# Patient Record
Sex: Female | Born: 1980
Health system: Southern US, Community
[De-identification: ages and names within clinical notes are randomized; demographics above are authoritative.]

## PROBLEM LIST (undated history)

## (undated) DIAGNOSIS — G8929 Other chronic pain: Secondary | ICD-10-CM

## (undated) DIAGNOSIS — F1410 Cocaine abuse, uncomplicated: Secondary | ICD-10-CM

## (undated) DIAGNOSIS — IMO0002 Reserved for concepts with insufficient information to code with codable children: Secondary | ICD-10-CM

## (undated) DIAGNOSIS — N809 Endometriosis, unspecified: Secondary | ICD-10-CM

## (undated) DIAGNOSIS — K219 Gastro-esophageal reflux disease without esophagitis: Secondary | ICD-10-CM

## (undated) DIAGNOSIS — F419 Anxiety disorder, unspecified: Secondary | ICD-10-CM

## (undated) DIAGNOSIS — R339 Retention of urine, unspecified: Secondary | ICD-10-CM

## (undated) DIAGNOSIS — I76 Septic arterial embolism: Secondary | ICD-10-CM

## (undated) DIAGNOSIS — M549 Dorsalgia, unspecified: Secondary | ICD-10-CM

## (undated) HISTORY — PX: TUBAL LIGATION: SHX77

## (undated) HISTORY — PX: APPENDECTOMY: SHX54

## (undated) HISTORY — DX: Cocaine abuse, uncomplicated: F14.10

## (undated) HISTORY — PX: TONSILLECTOMY: SUR1361

## (undated) HISTORY — PX: ABDOMINAL HYSTERECTOMY: SHX81

---

## 1999-01-19 ENCOUNTER — Encounter: Payer: Self-pay | Admitting: *Deleted

## 1999-01-19 ENCOUNTER — Inpatient Hospital Stay (HOSPITAL_COMMUNITY): Admission: AD | Admit: 1999-01-19 | Discharge: 1999-01-19 | Payer: Self-pay | Admitting: *Deleted

## 2000-03-24 ENCOUNTER — Emergency Department (HOSPITAL_COMMUNITY): Admission: EM | Admit: 2000-03-24 | Discharge: 2000-03-24 | Payer: Self-pay | Admitting: Emergency Medicine

## 2001-07-22 ENCOUNTER — Emergency Department (HOSPITAL_COMMUNITY): Admission: EM | Admit: 2001-07-22 | Discharge: 2001-07-22 | Payer: Self-pay | Admitting: Emergency Medicine

## 2001-07-22 ENCOUNTER — Encounter: Payer: Self-pay | Admitting: Emergency Medicine

## 2001-09-02 ENCOUNTER — Ambulatory Visit (HOSPITAL_COMMUNITY): Admission: RE | Admit: 2001-09-02 | Discharge: 2001-09-02 | Payer: Self-pay | Admitting: Cardiology

## 2001-09-02 ENCOUNTER — Encounter: Payer: Self-pay | Admitting: Cardiology

## 2001-09-15 ENCOUNTER — Encounter (INDEPENDENT_AMBULATORY_CARE_PROVIDER_SITE_OTHER): Payer: Self-pay | Admitting: Specialist

## 2001-09-15 ENCOUNTER — Ambulatory Visit (HOSPITAL_BASED_OUTPATIENT_CLINIC_OR_DEPARTMENT_OTHER): Admission: RE | Admit: 2001-09-15 | Discharge: 2001-09-15 | Payer: Self-pay | Admitting: Orthopedic Surgery

## 2001-10-21 ENCOUNTER — Emergency Department (HOSPITAL_COMMUNITY): Admission: EM | Admit: 2001-10-21 | Discharge: 2001-10-21 | Payer: Self-pay | Admitting: Emergency Medicine

## 2001-11-07 ENCOUNTER — Emergency Department (HOSPITAL_COMMUNITY): Admission: EM | Admit: 2001-11-07 | Discharge: 2001-11-08 | Payer: Self-pay

## 2001-11-16 ENCOUNTER — Ambulatory Visit (HOSPITAL_COMMUNITY): Admission: RE | Admit: 2001-11-16 | Discharge: 2001-11-16 | Payer: Self-pay | Admitting: *Deleted

## 2001-11-16 ENCOUNTER — Encounter: Payer: Self-pay | Admitting: *Deleted

## 2002-02-13 ENCOUNTER — Emergency Department (HOSPITAL_COMMUNITY): Admission: EM | Admit: 2002-02-13 | Discharge: 2002-02-13 | Payer: Self-pay | Admitting: *Deleted

## 2002-02-13 ENCOUNTER — Encounter: Payer: Self-pay | Admitting: *Deleted

## 2002-02-28 ENCOUNTER — Emergency Department (HOSPITAL_COMMUNITY): Admission: EM | Admit: 2002-02-28 | Discharge: 2002-03-01 | Payer: Self-pay | Admitting: Emergency Medicine

## 2002-03-04 ENCOUNTER — Emergency Department (HOSPITAL_COMMUNITY): Admission: EM | Admit: 2002-03-04 | Discharge: 2002-03-04 | Payer: Self-pay | Admitting: *Deleted

## 2002-05-16 ENCOUNTER — Emergency Department (HOSPITAL_COMMUNITY): Admission: EM | Admit: 2002-05-16 | Discharge: 2002-05-17 | Payer: Self-pay | Admitting: *Deleted

## 2002-06-05 ENCOUNTER — Encounter: Payer: Self-pay | Admitting: Emergency Medicine

## 2002-06-05 ENCOUNTER — Emergency Department (HOSPITAL_COMMUNITY): Admission: EM | Admit: 2002-06-05 | Discharge: 2002-06-05 | Payer: Self-pay | Admitting: Emergency Medicine

## 2002-07-25 ENCOUNTER — Emergency Department (HOSPITAL_COMMUNITY): Admission: EM | Admit: 2002-07-25 | Discharge: 2002-07-25 | Payer: Self-pay | Admitting: Emergency Medicine

## 2002-09-19 ENCOUNTER — Emergency Department (HOSPITAL_COMMUNITY): Admission: EM | Admit: 2002-09-19 | Discharge: 2002-09-19 | Payer: Self-pay | Admitting: Emergency Medicine

## 2002-10-09 ENCOUNTER — Emergency Department (HOSPITAL_COMMUNITY): Admission: EM | Admit: 2002-10-09 | Discharge: 2002-10-09 | Payer: Self-pay | Admitting: Emergency Medicine

## 2002-10-09 ENCOUNTER — Encounter: Payer: Self-pay | Admitting: Emergency Medicine

## 2002-10-16 ENCOUNTER — Emergency Department (HOSPITAL_COMMUNITY): Admission: EM | Admit: 2002-10-16 | Discharge: 2002-10-16 | Payer: Self-pay | Admitting: Emergency Medicine

## 2002-10-18 ENCOUNTER — Emergency Department (HOSPITAL_COMMUNITY): Admission: EM | Admit: 2002-10-18 | Discharge: 2002-10-18 | Payer: Self-pay | Admitting: Emergency Medicine

## 2002-10-18 ENCOUNTER — Encounter: Payer: Self-pay | Admitting: Emergency Medicine

## 2002-12-05 ENCOUNTER — Emergency Department (HOSPITAL_COMMUNITY): Admission: EM | Admit: 2002-12-05 | Discharge: 2002-12-05 | Payer: Self-pay | Admitting: Emergency Medicine

## 2003-03-09 ENCOUNTER — Inpatient Hospital Stay (HOSPITAL_COMMUNITY): Admission: EM | Admit: 2003-03-09 | Discharge: 2003-03-11 | Payer: Self-pay | Admitting: Emergency Medicine

## 2003-04-15 ENCOUNTER — Emergency Department (HOSPITAL_COMMUNITY): Admission: EM | Admit: 2003-04-15 | Discharge: 2003-04-15 | Payer: Self-pay | Admitting: Emergency Medicine

## 2003-09-28 ENCOUNTER — Emergency Department (HOSPITAL_COMMUNITY): Admission: EM | Admit: 2003-09-28 | Discharge: 2003-09-28 | Payer: Self-pay | Admitting: Emergency Medicine

## 2003-12-04 ENCOUNTER — Emergency Department (HOSPITAL_COMMUNITY): Admission: EM | Admit: 2003-12-04 | Discharge: 2003-12-05 | Payer: Self-pay | Admitting: Emergency Medicine

## 2004-05-01 ENCOUNTER — Emergency Department (HOSPITAL_COMMUNITY): Admission: EM | Admit: 2004-05-01 | Discharge: 2004-05-01 | Payer: Self-pay | Admitting: Emergency Medicine

## 2004-06-20 ENCOUNTER — Emergency Department (HOSPITAL_COMMUNITY): Admission: EM | Admit: 2004-06-20 | Discharge: 2004-06-20 | Payer: Self-pay | Admitting: Emergency Medicine

## 2004-08-15 ENCOUNTER — Emergency Department (HOSPITAL_COMMUNITY): Admission: EM | Admit: 2004-08-15 | Discharge: 2004-08-15 | Payer: Self-pay | Admitting: Emergency Medicine

## 2004-08-16 ENCOUNTER — Emergency Department (HOSPITAL_COMMUNITY): Admission: EM | Admit: 2004-08-16 | Discharge: 2004-08-16 | Payer: Self-pay | Admitting: Emergency Medicine

## 2004-08-19 ENCOUNTER — Ambulatory Visit: Payer: Self-pay | Admitting: Internal Medicine

## 2004-09-04 ENCOUNTER — Emergency Department (HOSPITAL_COMMUNITY): Admission: EM | Admit: 2004-09-04 | Discharge: 2004-09-05 | Payer: Self-pay | Admitting: Emergency Medicine

## 2004-10-04 ENCOUNTER — Emergency Department (HOSPITAL_COMMUNITY): Admission: EM | Admit: 2004-10-04 | Discharge: 2004-10-05 | Payer: Self-pay | Admitting: Emergency Medicine

## 2004-11-04 ENCOUNTER — Emergency Department (HOSPITAL_COMMUNITY): Admission: EM | Admit: 2004-11-04 | Discharge: 2004-11-04 | Payer: Self-pay | Admitting: Emergency Medicine

## 2004-11-07 ENCOUNTER — Inpatient Hospital Stay (HOSPITAL_COMMUNITY): Admission: AD | Admit: 2004-11-07 | Discharge: 2004-11-07 | Payer: Self-pay | Admitting: Obstetrics & Gynecology

## 2004-11-23 ENCOUNTER — Emergency Department (HOSPITAL_COMMUNITY): Admission: EM | Admit: 2004-11-23 | Discharge: 2004-11-23 | Payer: Self-pay | Admitting: Emergency Medicine

## 2004-11-24 ENCOUNTER — Emergency Department (HOSPITAL_COMMUNITY): Admission: EM | Admit: 2004-11-24 | Discharge: 2004-11-24 | Payer: Self-pay | Admitting: Emergency Medicine

## 2004-11-27 ENCOUNTER — Emergency Department (HOSPITAL_COMMUNITY): Admission: EM | Admit: 2004-11-27 | Discharge: 2004-11-27 | Payer: Self-pay | Admitting: Emergency Medicine

## 2004-12-05 ENCOUNTER — Emergency Department (HOSPITAL_COMMUNITY): Admission: EM | Admit: 2004-12-05 | Discharge: 2004-12-05 | Payer: Self-pay | Admitting: Emergency Medicine

## 2004-12-06 ENCOUNTER — Emergency Department (HOSPITAL_COMMUNITY): Admission: EM | Admit: 2004-12-06 | Discharge: 2004-12-06 | Payer: Self-pay | Admitting: Emergency Medicine

## 2004-12-08 ENCOUNTER — Inpatient Hospital Stay (HOSPITAL_COMMUNITY): Admission: AD | Admit: 2004-12-08 | Discharge: 2004-12-08 | Payer: Self-pay | Admitting: Obstetrics & Gynecology

## 2004-12-11 ENCOUNTER — Encounter: Payer: Self-pay | Admitting: Family Medicine

## 2004-12-11 ENCOUNTER — Emergency Department (HOSPITAL_COMMUNITY): Admission: EM | Admit: 2004-12-11 | Discharge: 2004-12-11 | Payer: Self-pay | Admitting: Emergency Medicine

## 2004-12-16 ENCOUNTER — Emergency Department (HOSPITAL_COMMUNITY): Admission: EM | Admit: 2004-12-16 | Discharge: 2004-12-16 | Payer: Self-pay | Admitting: Emergency Medicine

## 2004-12-19 ENCOUNTER — Emergency Department (HOSPITAL_COMMUNITY): Admission: EM | Admit: 2004-12-19 | Discharge: 2004-12-19 | Payer: Self-pay | Admitting: Emergency Medicine

## 2004-12-20 ENCOUNTER — Emergency Department (HOSPITAL_COMMUNITY): Admission: EM | Admit: 2004-12-20 | Discharge: 2004-12-20 | Payer: Self-pay | Admitting: Emergency Medicine

## 2004-12-23 ENCOUNTER — Emergency Department (HOSPITAL_COMMUNITY): Admission: EM | Admit: 2004-12-23 | Discharge: 2004-12-23 | Payer: Self-pay | Admitting: Emergency Medicine

## 2004-12-24 ENCOUNTER — Emergency Department (HOSPITAL_COMMUNITY): Admission: EM | Admit: 2004-12-24 | Discharge: 2004-12-25 | Payer: Self-pay | Admitting: Emergency Medicine

## 2004-12-25 ENCOUNTER — Emergency Department (HOSPITAL_COMMUNITY): Admission: EM | Admit: 2004-12-25 | Discharge: 2004-12-25 | Payer: Self-pay | Admitting: Emergency Medicine

## 2004-12-27 ENCOUNTER — Emergency Department (HOSPITAL_COMMUNITY): Admission: EM | Admit: 2004-12-27 | Discharge: 2004-12-27 | Payer: Self-pay | Admitting: Emergency Medicine

## 2005-01-05 ENCOUNTER — Inpatient Hospital Stay (HOSPITAL_COMMUNITY): Admission: AD | Admit: 2005-01-05 | Discharge: 2005-01-05 | Payer: Self-pay | Admitting: Obstetrics & Gynecology

## 2005-01-06 ENCOUNTER — Ambulatory Visit: Payer: Self-pay | Admitting: *Deleted

## 2005-01-06 ENCOUNTER — Inpatient Hospital Stay (HOSPITAL_COMMUNITY): Admission: AD | Admit: 2005-01-06 | Discharge: 2005-01-09 | Payer: Self-pay | Admitting: Obstetrics and Gynecology

## 2005-01-23 ENCOUNTER — Ambulatory Visit: Payer: Self-pay | Admitting: Family Medicine

## 2005-02-06 ENCOUNTER — Ambulatory Visit (HOSPITAL_COMMUNITY): Admission: RE | Admit: 2005-02-06 | Discharge: 2005-02-06 | Payer: Self-pay | Admitting: *Deleted

## 2005-02-06 ENCOUNTER — Ambulatory Visit: Payer: Self-pay | Admitting: Family Medicine

## 2005-02-20 ENCOUNTER — Ambulatory Visit: Payer: Self-pay | Admitting: Family Medicine

## 2005-03-06 ENCOUNTER — Ambulatory Visit: Payer: Self-pay | Admitting: Family Medicine

## 2005-03-06 ENCOUNTER — Encounter: Payer: Self-pay | Admitting: Family Medicine

## 2005-03-12 ENCOUNTER — Ambulatory Visit: Payer: Self-pay | Admitting: *Deleted

## 2005-03-20 ENCOUNTER — Ambulatory Visit: Payer: Self-pay | Admitting: Family Medicine

## 2005-03-31 ENCOUNTER — Ambulatory Visit: Payer: Self-pay | Admitting: *Deleted

## 2005-04-07 ENCOUNTER — Ambulatory Visit: Payer: Self-pay | Admitting: Obstetrics & Gynecology

## 2005-04-21 ENCOUNTER — Ambulatory Visit: Payer: Self-pay | Admitting: Family Medicine

## 2005-04-29 ENCOUNTER — Ambulatory Visit (HOSPITAL_COMMUNITY): Admission: RE | Admit: 2005-04-29 | Discharge: 2005-04-29 | Payer: Self-pay | Admitting: *Deleted

## 2005-05-05 ENCOUNTER — Ambulatory Visit: Payer: Self-pay | Admitting: Obstetrics & Gynecology

## 2005-05-08 ENCOUNTER — Inpatient Hospital Stay (HOSPITAL_COMMUNITY): Admission: AD | Admit: 2005-05-08 | Discharge: 2005-05-08 | Payer: Self-pay | Admitting: Obstetrics & Gynecology

## 2005-05-09 ENCOUNTER — Inpatient Hospital Stay (HOSPITAL_COMMUNITY): Admission: AD | Admit: 2005-05-09 | Discharge: 2005-05-09 | Payer: Self-pay | Admitting: *Deleted

## 2005-05-19 ENCOUNTER — Ambulatory Visit: Payer: Self-pay | Admitting: Obstetrics & Gynecology

## 2005-05-23 ENCOUNTER — Ambulatory Visit (HOSPITAL_COMMUNITY): Admission: RE | Admit: 2005-05-23 | Discharge: 2005-05-23 | Payer: Self-pay | Admitting: *Deleted

## 2005-05-26 ENCOUNTER — Ambulatory Visit: Payer: Self-pay | Admitting: Family Medicine

## 2005-05-31 ENCOUNTER — Ambulatory Visit: Payer: Self-pay | Admitting: Certified Nurse Midwife

## 2005-05-31 ENCOUNTER — Inpatient Hospital Stay (HOSPITAL_COMMUNITY): Admission: AD | Admit: 2005-05-31 | Discharge: 2005-05-31 | Payer: Self-pay | Admitting: Family Medicine

## 2005-06-02 ENCOUNTER — Ambulatory Visit: Payer: Self-pay | Admitting: Obstetrics & Gynecology

## 2005-06-03 ENCOUNTER — Inpatient Hospital Stay (HOSPITAL_COMMUNITY): Admission: AD | Admit: 2005-06-03 | Discharge: 2005-06-04 | Payer: Self-pay | Admitting: Gynecology

## 2005-06-04 ENCOUNTER — Inpatient Hospital Stay (HOSPITAL_COMMUNITY): Admission: AD | Admit: 2005-06-04 | Discharge: 2005-06-05 | Payer: Self-pay | Admitting: Obstetrics and Gynecology

## 2005-06-04 ENCOUNTER — Ambulatory Visit: Payer: Self-pay | Admitting: Obstetrics and Gynecology

## 2005-06-09 ENCOUNTER — Inpatient Hospital Stay (HOSPITAL_COMMUNITY): Admission: AD | Admit: 2005-06-09 | Discharge: 2005-06-09 | Payer: Self-pay | Admitting: Gynecology

## 2005-06-09 ENCOUNTER — Inpatient Hospital Stay (HOSPITAL_COMMUNITY): Admission: AD | Admit: 2005-06-09 | Discharge: 2005-06-09 | Payer: Self-pay | Admitting: Obstetrics & Gynecology

## 2005-06-09 ENCOUNTER — Ambulatory Visit: Payer: Self-pay | Admitting: Family Medicine

## 2005-06-09 ENCOUNTER — Ambulatory Visit: Payer: Self-pay | Admitting: Certified Nurse Midwife

## 2005-06-10 ENCOUNTER — Inpatient Hospital Stay (HOSPITAL_COMMUNITY): Admission: AD | Admit: 2005-06-10 | Discharge: 2005-06-12 | Payer: Self-pay | Admitting: Gynecology

## 2005-06-10 ENCOUNTER — Ambulatory Visit: Payer: Self-pay | Admitting: Family Medicine

## 2005-09-10 ENCOUNTER — Emergency Department (HOSPITAL_COMMUNITY): Admission: EM | Admit: 2005-09-10 | Discharge: 2005-09-10 | Payer: Self-pay | Admitting: Emergency Medicine

## 2005-09-13 ENCOUNTER — Emergency Department (HOSPITAL_COMMUNITY): Admission: EM | Admit: 2005-09-13 | Discharge: 2005-09-13 | Payer: Self-pay | Admitting: Emergency Medicine

## 2006-10-19 ENCOUNTER — Emergency Department (HOSPITAL_COMMUNITY): Admission: EM | Admit: 2006-10-19 | Discharge: 2006-10-20 | Payer: Self-pay | Admitting: Emergency Medicine

## 2007-03-10 ENCOUNTER — Ambulatory Visit (HOSPITAL_COMMUNITY): Admission: RE | Admit: 2007-03-10 | Discharge: 2007-03-10 | Payer: Self-pay | Admitting: Psychiatry

## 2007-03-16 ENCOUNTER — Inpatient Hospital Stay (HOSPITAL_COMMUNITY): Admission: AD | Admit: 2007-03-16 | Discharge: 2007-03-16 | Payer: Self-pay | Admitting: Family Medicine

## 2007-04-14 ENCOUNTER — Ambulatory Visit: Payer: Self-pay | Admitting: Obstetrics & Gynecology

## 2007-04-14 ENCOUNTER — Encounter: Payer: Self-pay | Admitting: Obstetrics and Gynecology

## 2007-04-22 ENCOUNTER — Ambulatory Visit: Payer: Self-pay | Admitting: Family Medicine

## 2007-04-27 ENCOUNTER — Ambulatory Visit (HOSPITAL_COMMUNITY): Admission: RE | Admit: 2007-04-27 | Discharge: 2007-04-27 | Payer: Self-pay | Admitting: Family Medicine

## 2007-05-13 ENCOUNTER — Ambulatory Visit: Payer: Self-pay | Admitting: Obstetrics & Gynecology

## 2007-05-16 ENCOUNTER — Inpatient Hospital Stay (HOSPITAL_COMMUNITY): Admission: AD | Admit: 2007-05-16 | Discharge: 2007-05-16 | Payer: Self-pay | Admitting: Obstetrics and Gynecology

## 2007-06-08 ENCOUNTER — Ambulatory Visit (HOSPITAL_COMMUNITY): Admission: RE | Admit: 2007-06-08 | Discharge: 2007-06-08 | Payer: Self-pay | Admitting: Family Medicine

## 2007-06-24 ENCOUNTER — Ambulatory Visit: Payer: Self-pay | Admitting: *Deleted

## 2007-07-08 ENCOUNTER — Ambulatory Visit: Payer: Self-pay | Admitting: Obstetrics & Gynecology

## 2007-07-08 ENCOUNTER — Ambulatory Visit (HOSPITAL_COMMUNITY): Admission: RE | Admit: 2007-07-08 | Discharge: 2007-07-08 | Payer: Self-pay | Admitting: Family Medicine

## 2007-07-22 ENCOUNTER — Ambulatory Visit: Payer: Self-pay | Admitting: Obstetrics & Gynecology

## 2007-08-05 ENCOUNTER — Ambulatory Visit: Payer: Self-pay | Admitting: *Deleted

## 2007-08-05 ENCOUNTER — Ambulatory Visit (HOSPITAL_COMMUNITY): Admission: RE | Admit: 2007-08-05 | Discharge: 2007-08-05 | Payer: Self-pay | Admitting: Family Medicine

## 2007-08-19 ENCOUNTER — Ambulatory Visit: Payer: Self-pay | Admitting: Obstetrics & Gynecology

## 2007-08-23 ENCOUNTER — Ambulatory Visit: Payer: Self-pay | Admitting: Physician Assistant

## 2007-08-23 ENCOUNTER — Inpatient Hospital Stay (HOSPITAL_COMMUNITY): Admission: AD | Admit: 2007-08-23 | Discharge: 2007-08-24 | Payer: Self-pay | Admitting: Obstetrics & Gynecology

## 2007-09-02 ENCOUNTER — Ambulatory Visit: Payer: Self-pay | Admitting: Family Medicine

## 2007-09-02 ENCOUNTER — Ambulatory Visit (HOSPITAL_COMMUNITY): Admission: RE | Admit: 2007-09-02 | Discharge: 2007-09-02 | Payer: Self-pay | Admitting: Family Medicine

## 2007-09-09 ENCOUNTER — Ambulatory Visit: Payer: Self-pay | Admitting: Obstetrics & Gynecology

## 2007-09-17 ENCOUNTER — Ambulatory Visit: Payer: Self-pay | Admitting: Obstetrics & Gynecology

## 2007-09-17 ENCOUNTER — Inpatient Hospital Stay (HOSPITAL_COMMUNITY): Admission: AD | Admit: 2007-09-17 | Discharge: 2007-09-17 | Payer: Self-pay | Admitting: Obstetrics & Gynecology

## 2007-09-18 ENCOUNTER — Ambulatory Visit: Payer: Self-pay | Admitting: Gynecology

## 2007-09-18 ENCOUNTER — Inpatient Hospital Stay (HOSPITAL_COMMUNITY): Admission: AD | Admit: 2007-09-18 | Discharge: 2007-09-18 | Payer: Self-pay | Admitting: Gynecology

## 2007-09-23 ENCOUNTER — Other Ambulatory Visit: Payer: Self-pay | Admitting: Obstetrics & Gynecology

## 2007-09-30 ENCOUNTER — Ambulatory Visit (HOSPITAL_COMMUNITY): Admission: RE | Admit: 2007-09-30 | Discharge: 2007-09-30 | Payer: Self-pay | Admitting: Family Medicine

## 2007-09-30 ENCOUNTER — Ambulatory Visit: Payer: Self-pay | Admitting: Obstetrics & Gynecology

## 2007-10-01 ENCOUNTER — Inpatient Hospital Stay (HOSPITAL_COMMUNITY): Admission: AD | Admit: 2007-10-01 | Discharge: 2007-10-01 | Payer: Self-pay | Admitting: Obstetrics & Gynecology

## 2007-10-01 ENCOUNTER — Ambulatory Visit: Payer: Self-pay | Admitting: Obstetrics and Gynecology

## 2007-10-10 ENCOUNTER — Ambulatory Visit: Payer: Self-pay | Admitting: Obstetrics and Gynecology

## 2007-10-10 ENCOUNTER — Inpatient Hospital Stay (HOSPITAL_COMMUNITY): Admission: AD | Admit: 2007-10-10 | Discharge: 2007-10-11 | Payer: Self-pay | Admitting: Obstetrics & Gynecology

## 2007-10-11 ENCOUNTER — Ambulatory Visit: Payer: Self-pay | Admitting: Obstetrics & Gynecology

## 2007-10-12 ENCOUNTER — Inpatient Hospital Stay (HOSPITAL_COMMUNITY): Admission: RE | Admit: 2007-10-12 | Discharge: 2007-10-14 | Payer: Self-pay | Admitting: Obstetrics & Gynecology

## 2007-10-12 ENCOUNTER — Ambulatory Visit: Payer: Self-pay | Admitting: Advanced Practice Midwife

## 2008-04-01 ENCOUNTER — Emergency Department (HOSPITAL_COMMUNITY): Admission: EM | Admit: 2008-04-01 | Discharge: 2008-04-01 | Payer: Self-pay | Admitting: Emergency Medicine

## 2008-06-10 ENCOUNTER — Emergency Department (HOSPITAL_COMMUNITY): Admission: EM | Admit: 2008-06-10 | Discharge: 2008-06-10 | Payer: Self-pay | Admitting: Emergency Medicine

## 2009-04-12 ENCOUNTER — Other Ambulatory Visit: Payer: Self-pay

## 2009-04-12 ENCOUNTER — Ambulatory Visit: Payer: Self-pay | Admitting: Psychiatry

## 2009-04-12 ENCOUNTER — Other Ambulatory Visit: Payer: Self-pay | Admitting: Emergency Medicine

## 2009-04-12 ENCOUNTER — Inpatient Hospital Stay (HOSPITAL_COMMUNITY): Admission: AD | Admit: 2009-04-12 | Discharge: 2009-04-17 | Payer: Self-pay | Admitting: Psychiatry

## 2009-06-06 ENCOUNTER — Emergency Department (HOSPITAL_COMMUNITY): Admission: EM | Admit: 2009-06-06 | Discharge: 2009-06-07 | Payer: Self-pay | Admitting: Emergency Medicine

## 2009-06-07 ENCOUNTER — Inpatient Hospital Stay (HOSPITAL_COMMUNITY): Admission: EM | Admit: 2009-06-07 | Discharge: 2009-06-11 | Payer: Self-pay | Admitting: Psychiatry

## 2009-06-07 ENCOUNTER — Ambulatory Visit: Payer: Self-pay | Admitting: Psychiatry

## 2009-07-28 ENCOUNTER — Emergency Department (HOSPITAL_COMMUNITY): Admission: EM | Admit: 2009-07-28 | Discharge: 2009-07-28 | Payer: Self-pay | Admitting: Emergency Medicine

## 2009-09-27 ENCOUNTER — Emergency Department (HOSPITAL_COMMUNITY)
Admission: EM | Admit: 2009-09-27 | Discharge: 2009-09-27 | Payer: Self-pay | Source: Home / Self Care | Admitting: Emergency Medicine

## 2009-09-29 ENCOUNTER — Emergency Department (HOSPITAL_COMMUNITY): Admission: EM | Admit: 2009-09-29 | Discharge: 2009-09-29 | Payer: Self-pay | Admitting: Emergency Medicine

## 2010-02-10 ENCOUNTER — Encounter: Payer: Self-pay | Admitting: *Deleted

## 2010-04-03 ENCOUNTER — Emergency Department (HOSPITAL_COMMUNITY)
Admission: EM | Admit: 2010-04-03 | Discharge: 2010-04-03 | Disposition: A | Discharge status: Home / Self Care | Payer: Self-pay | Attending: Emergency Medicine | Admitting: Emergency Medicine

## 2010-04-03 DIAGNOSIS — J069 Acute upper respiratory infection, unspecified: Secondary | ICD-10-CM | POA: Insufficient documentation

## 2010-04-03 DIAGNOSIS — R05 Cough: Secondary | ICD-10-CM | POA: Insufficient documentation

## 2010-04-03 DIAGNOSIS — R059 Cough, unspecified: Secondary | ICD-10-CM | POA: Insufficient documentation

## 2010-04-03 DIAGNOSIS — J040 Acute laryngitis: Secondary | ICD-10-CM | POA: Insufficient documentation

## 2010-04-08 LAB — CBC
HCT: 39.9 % (ref 36.0–46.0)
Platelets: 294 10*3/uL (ref 150–400)
RDW: 13.7 % (ref 11.5–15.5)

## 2010-04-08 LAB — RAPID URINE DRUG SCREEN, HOSP PERFORMED
Barbiturates: NOT DETECTED
Opiates: POSITIVE — AB

## 2010-04-08 LAB — BASIC METABOLIC PANEL
BUN: 3 mg/dL — ABNORMAL LOW (ref 6–23)
Creatinine, Ser: 0.69 mg/dL (ref 0.4–1.2)
GFR calc non Af Amer: >60 mL/min (ref >60)
Glucose, Bld: 118 mg/dL — ABNORMAL HIGH (ref 70–99)
Potassium: 3.6 mEq/L (ref 3.5–5.1)

## 2010-04-08 LAB — ETHANOL: Alcohol, Ethyl (B): <5 mg/dL (ref 0–10)

## 2010-04-08 LAB — DIFFERENTIAL
Basophils Absolute: 0.1 10*3/uL (ref 0.0–0.1)
Eosinophils Absolute: 0.2 10*3/uL (ref 0.0–0.7)
Eosinophils Relative: 2 % (ref 0–5)
Lymphocytes Relative: 25 % (ref 12–46)
Neutrophils Relative %: 69 % (ref 43–77)

## 2010-04-14 LAB — RAPID URINE DRUG SCREEN, HOSP PERFORMED
Amphetamines: NOT DETECTED
Cocaine: POSITIVE — AB
Opiates: POSITIVE — AB
Tetrahydrocannabinol: POSITIVE — AB

## 2010-04-14 LAB — POCT I-STAT, CHEM 8
Glucose, Bld: 118 mg/dL — ABNORMAL HIGH (ref 70–99)
HCT: 43 % (ref 36.0–46.0)
Hemoglobin: 14.6 g/dL (ref 12.0–15.0)
Potassium: 3.8 mEq/L (ref 3.5–5.1)
Sodium: 140 mEq/L (ref 135–145)

## 2010-04-14 LAB — CBC
MCHC: 34.5 g/dL (ref 30.0–36.0)
MCV: 93.5 fL (ref 78.0–100.0)
Platelets: 233 10*3/uL (ref 150–400)
RDW: 13.1 % (ref 11.5–15.5)

## 2010-04-14 LAB — DIFFERENTIAL
Basophils Absolute: 0 10*3/uL (ref 0.0–0.1)
Basophils Relative: 0 % (ref 0–1)
Eosinophils Absolute: 0.1 10*3/uL (ref 0.0–0.7)
Neutrophils Relative %: 82 % — ABNORMAL HIGH (ref 43–77)

## 2010-04-14 LAB — URINALYSIS, ROUTINE W REFLEX MICROSCOPIC
Ketones, ur: NEGATIVE mg/dL
Nitrite: NEGATIVE
Protein, ur: NEGATIVE mg/dL
pH: 7 (ref 5.0–8.0)

## 2010-04-15 LAB — HEPATIC FUNCTION PANEL
ALT: 21 U/L (ref 0–35)
AST: 13 U/L (ref 0–37)
Albumin: 3.5 g/dL (ref 3.5–5.2)
Alkaline Phosphatase: 60 U/L (ref 39–117)
Bilirubin, Direct: <0.1 mg/dL (ref 0.0–0.3)
Total Bilirubin: 0.2 mg/dL — ABNORMAL LOW (ref 0.3–1.2)
Total Protein: 7.2 g/dL (ref 6.0–8.3)

## 2010-04-15 LAB — TSH: TSH: 0.736 u[IU]/mL (ref 0.350–4.500)

## 2010-04-15 LAB — T4, FREE: Free T4: 1 ng/dL (ref 0.80–1.80)

## 2010-04-15 LAB — SYPHILIS: RPR W/REFLEX TO RPR TITER AND TREPONEMAL ANTIBODIES, TRADITIONAL SCREENING AND DIAGNOSIS ALGORITHM: RPR Ser Ql: NONREACTIVE

## 2010-06-04 NOTE — Op Note (Signed)
NAME:  [name] [name], [name] [name]             ACCOUNT NO.:  [id number]   MEDICAL RECORD NO.:  [id number]         PATIENT TYPE:  WINP   LOCATION:                                FACILITY:  [facility]   PHYSICIAN:  Tanya S. Shawnie Pons, M.D.   DATE OF BIRTH:  1980-04-14   DATE OF PROCEDURE:  10/13/2007  DATE OF DISCHARGE:                               OPERATIVE REPORT   PREOPERATIVE DIAGNOSES:  Multiparity, undesired fertility.   POSTOPERATIVE DIAGNOSES:  Multiparity, undesired fertility.   PROCEDURE:  Postpartum bilateral tubal ligation.   SURGEON:  Shelbie Proctor. Shawnie Pons, MD.   ASSISTANT:  None.   ANESTHESIA:  General with Dr. Pamalee Leyden.   FINDINGS:  Normal-appearing tubes.   SPECIMENS:  None.   ESTIMATED BLOOD LOSS:  Minimal.   COMPLICATIONS:  None known.   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  The patient desires permanent sterility.  The  patient was counseled regarding risks of this procedure including  permanency of the procedure, risk of failure 1 in 100, and increased  risk of ectopic bleeding, and infection risk.  The patient agrees with  these and desires to proceed.   DESCRIPTION OF PROCEDURE:  The patient was taken to the OR.  She was  placed in supine position.  She was prepped and draped in usual sterile  fashion.  A 1.5-cm incision was made infraumbilically and carried down  to underlying fascia after injection with 10 mL of 0.5% Marcaine with  epinephrine.  The peritoneal cavity was entered sharply and the patient  was placed in Trendelenburg.  The patient's right tube was identified  and grasped with a Babcock clamp followed to its fimbriated end.  A  Filshie clip was placed across this tube and this tube was also returned  to the cavity.  The patient's left tube was identified and grasped  with  a Babcock clamp, brought out of the incision followed to its fimbriated  end.  A Filshie clip was placed across this tube as well.  The tube was  allowed to return to the abdominal cavity.  All instruments were removed  from the abdomen.  The fascia was closed with a 0-  Vicryl suture in a running fashion.  Subcuticular tissue closure with 4-  0 Vicryl was used to close the incision.  All instrument and lap counts  correct were x2.  The patient was awakened and taken to recovery room in  stable condition.      Shelbie Proctor. Shawnie Pons, M.D.  Electronically Signed     TSP/MEDQ  D:  10/13/2007  T:  10/14/2007  Job:  [id number]

## 2010-06-07 NOTE — H&P (Signed)
NAME:  [name], [name]                       ACCOUNT NO.:  [id number]   MEDICAL RECORD NO.:  [id number]                   PATIENT TYPE:  INP   LOCATION:  0445                                 FACILITY:  [facility]   PHYSICIAN:  Sherin Quarry, MD                   DATE OF BIRTH:  [birth year]   DATE OF ADMISSION:  03/09/2003  DATE OF DISCHARGE:                                HISTORY & PHYSICAL   HISTORY OF PRESENT ILLNESS:  [name] [name] is a 30 year old lady who  indicates that she is an occasional user of heroine.  She states that she  injected herself with heroine in the right antecubital area on Sunday night.  On Monday she had noted redness and discomfort in this area and presented to  Dr. Jeannetta Nap office.  He diagnosed a cellulitis and prescribed dicloxacillin  500 mg p.o. q.6h.  She states that she has been taking the antibiotic  regularly, and has taken at least six doses.  However, unfortunately there  has been increasing pain in the antecubital area as well as spreading  redness and intermittent fever.  For this reason, she presents to the [facility] Emergency Room.  After presenting to the emergency room, the area just  above the antecubital region began to drain purulent material.  She is  admitted at this time for treatment of an infectious thrombophlebitis,  possibly secondary to a Staphylococcal infection.   MEDICATIONS:  1. The patient indicates that she takes Zomig 5 mg p.r.n. for headache.  2. Amitriptyline 50 mg at bedtime.  3. Klonopin 0.5 mg b.i.d.  4. Nexium 40 mg daily.   ALLERGIES:  IBUPROFEN and possibly to PENICILLIN, although it is noteworthy  that she has been taking dicloxacillin.   PAST SURGICAL HISTORY:  She denies any previous operations.   It is noteworthy from review of the medical record that she has had many  previous emergency room visits and has had extensive CT scanning of the  head, chest, and abdomen in the past.  She is somewhat unclear about  why all  of these tests were done.   FAMILY HISTORY:  Her father has cirrhosis of the liver secondary to alcohol  abuse.  Her mother and brothers are in good health.   SOCIAL HISTORY:  She smokes one to two packs of cigarettes per day.  She  will occasionally drink alcohol.  As previously mentioned, she states that  she uses intravenous heroine on an occasional basis.   REVIEW OF SYSTEMS:  HEAD:  She denies headache or dizziness.  EYES:  She  denies visual blurring or diplopia.  EARS, NOSE, AND THROAT:  Denies  earache, sinus pain, or sore throat.  CHEST:  Denies coughing, wheezing, or  chest congestion.  CARDIOVASCULAR:  Denies orthopnea, PND, or ankle edema.  GASTROINTESTINAL:  Denies nausea, vomiting, abdominal pain.  GENITOURINARY:  Denies dysuria or urinary frequency.  NEUROLOGIC:  Denies history of seizure  or stroke.  ENDOCRINE:  Denies excessive thirst, urinary frequency, or  nocturia.   PHYSICAL EXAMINATION:  HEENT:  Within normal limits.  CHEST:  Clear to auscultation and percussion.  BACK:  No CVA or point tenderness.  CARDIOVASCULAR:  Normal S1 and S2 without murmurs, rubs, or gallops.  ABDOMEN:  Benign, normal bowel sounds without masses, tenderness, or  organomegaly.  NEUROLOGIC:  Within normal limits.  EXTREMITIES:  Remarkable for the right antecubital area which is swollen and  very tender.  There is spontaneous drainage of a substantial amount of  purulent material, redness and tenderness extends about  6 cm above the antecubital fossa.   IMPRESSION:  1. Pyelophlebitis secondary to intravenous heroine usage.  2. Chronic migraine headaches.  3. Chronic anxiety.  4. Gastroesophageal reflux.   RECOMMENDATIONS:  I advised the patient that we had been seeing a fairly  frequent presentation of people with outpatient acquired methicillin-  resistant Staphylococcus infections, and that it would probably be best to  initiate therapy with vancomycin and Rocephin to  cover this possibility.  A  culture was obtained of the drainage and sent for Gram stain and routine  culture.  We will continue her usual medications.  Given her history of  using intravenous drugs, it would probably be a good idea at some point to  obtain human immunodeficiency virus testing.  CBC and CMET will also be  obtained.                                               Sherin Quarry, MD    SY/MEDQ  D:  03/09/2003  T:  03/09/2003  Job:  [id number]   cc:   Windle Guard, M.D.  [address]  [place], [place] [address]  Fax: [phone]

## 2010-06-07 NOTE — Discharge Summary (Signed)
NAME:  [name] [name]             ACCOUNT NO.:  [id number]   MEDICAL RECORD NO.:  [id number]          PATIENT TYPE:  INP   LOCATION:  9317                          FACILITY:  [facility]   PHYSICIAN:  Angie B. Merlene Morse, MD  DATE OF BIRTH:  Nov 08, 1980   DATE OF ADMISSION:  01/06/2005  DATE OF DISCHARGE:  01/09/2005                                 DISCHARGE SUMMARY   ADMITTING DIAGNOSES:  1.  Migraine.  2.  Hyperemesis gravidarum.  3.  Fifteen-week intrauterine pregnancy.   DISCHARGE DIAGNOSES:  1.  Fifteen-week intrauterine pregnancy.  2.  Migraine, resolved.  3.  Nausea and vomiting, resolved.  4.  Chemical dependence.   ADMITTING ATTENDING:  Dr. Shawnie Pons.   DISCHARGE ATTENDING:  Dr. Gavin Potters.   CONSULTS:  Neurology and psychiatry, Dr. Jeanie Sewer.   ADMITTING HISTORY AND PHYSICAL:  The patient is a 30 year old G7, P1-0-5-1  at 94 and [redacted] weeks gestation complaining vomiting, and diarrhea for 18 hours.  She also complains a headache.  This is her third ER visit in a 24-hour  period, she has had Demerol, Phenergan, three bags of IV fluid, Reglan,  Dilaudid.  She last took Phenergan five hours before her arrival in the MAU,  was having vomiting despite that.   MEDICATIONS:  Prenatal vitamins, Enderal, Phenergan, magnesium, Percocet.   ALLERGIES:  IBUPROFEN, PENICILLIN, CODEINE.   OBSTETRIC HISTORY:  SVD x1.  EAB x4.  Incomplete AB x1.   GYNECOLOGIC HISTORY:  Normal Pap.  Endometriosis.   MEDICAL:  Migraine for eight to nine years, history of hyperemesis  gravidarum with the first pregnancy.  Her TABS were not related to  hyperemesis.   SURGERIES:  D&E x5, appendectomy, tonsillectomy, diagnostic laparoscopy x2.   SOCIAL HISTORY:  Half a pack per day, were ethanol, denies drug use.  One  partner for three years.  She has had 16 ER visits this past three months.   PHYSICAL EXAMINATION:  VITAL SIGNS:  Her temp was 99.7, her pulse was 98,  respiratory rate was 20, blood pressure  110/60.  GENERAL:  She is a well-developed, well-nourished female, appeared  comfortable.  She was curled into a fetal position.  CHEST:  Clear to auscultation bilaterally.  ABDOMEN:  Gravid, diffusely mildly tender to palpation with no rebound.  HEART:  Regular rhythm and rate.   HOSPITAL COURSE:  The patient was admitted to [facility].  She was  started on IV fluids.  She was given Zofran and Phenergan, as well as  Dilaudid.  On January 07, 2005, her Dilaudid was stopped.  The patient  started tolerating a bland diet including soup.  The evening of January 07, 2005 she complained of severe abdominal pain.  She was given a dose of  Percocet for this.  Patient on January 08, 2005 again complained of  abdominal pain.  Bentall was started.  Patient also had received Milk of  Magnesia.  She had not had a bowel movement in two days.  On the day of  discharge, patient denied any pain, denied any headache, or abdominal pain.   NEUROLOGIC:  Doctor for  her migraines, Ms. Thomasene Ripple sees Dr. Thad Ranger as an  outpatient.  She did have a neural consult while she was in the hospital and  they recommended starting Flexeril in addition to the [facility] that she was  on.  This was started.  The patient tolerated this well and patient is to go  home on this.   PSYCHIATRIC:  Dr. Jeanie Sewer was consulted, as it seemed that patient had a  problem with chemical dependence and possibly anxiety.  He felt that she  would benefit from counseling and possibly chemical dependence counseling,  and this was to be arranged by Case Management.   DISCHARGE INSTRUCTIONS:  The patient is to follow-up with the clinic next  week.  An appointment will be made for her.   DISCHARGE MEDICATIONS:  1.  Flexeril, to take 10 mg t.i.d. #40.  2.  Zofran 4 mg #20.  3.  Phenergan 25 mg #15.   The patient is to follow-up with neuro in one to two weeks and she is to  have psych to follow-up with Behavioral Health, as  written in the chart.           ______________________________  August Saucer. Merlene Morse, MD     ABC/MEDQ  D:  01/09/2005  T:  01/09/2005  Job:  [id number]

## 2010-06-07 NOTE — Op Note (Signed)
   NAME:  [name], [name]                       ACCOUNT NO.:  [id number]   MEDICAL RECORD NO.:  [id number]                   PATIENT TYPE:  OUT   LOCATION:  NUC                                  FACILITY:  [facility]   PHYSICIAN:  Artist Pais. Mina Marble, M.D.           DATE OF BIRTH:  Jan 23, 1980   DATE OF PROCEDURE:  09/15/2001  DATE OF DISCHARGE:                                 OPERATIVE REPORT   PREOPERATIVE DIAGNOSIS:  Mass thenar eminence, right hand.   POSTOPERATIVE DIAGNOSIS:  Mass thenar eminence, right hand.   PROCEDURE:  Excisional biopsy of mass of thenar eminence.   SURGEON:  Artist Pais. Mina Marble, M.D.   ASSISTANT:  R.N.   ANESTHESIA:  General.   TOURNIQUET TIME:  5 minutes.   COMPLICATIONS:  None.   DRAINS:  None.   SPECIMENS:  None sent.   DESCRIPTION OF PROCEDURE:  The patient was taken to the operating room.  After the induction of adequate general anesthesia, right upper extremity  was prepped and draped in usual sterile fashion.  An Esmarch was used to  exsanguinate the limb.  Tourniquet was inflated to 250 mmHg.  At this point  in time, a 2 cm incision was made paralleling the thenar crease over the  thenar musculature, and incision was taken down through the skin and  subcutaneous tissue.  The entire pulsatile mass was identified and appeared  to be either a hemangioma or a aberrant arterial branch which was somewhat  enlarged.  There was also some surrounding adipose tissue.  The terminal and  proximal extent of this mass were tied off using 4-0 Vicryl, and this mass  was excised.  It seemed to be coming from the thenar musculature itself, and  a small portion of thenar musculature was also excised.  The wound was then  thoroughly irrigated.  Hemostasis was achieved with bipolar cautery an  closed with running Prolene subcuticular stitch.  Steri-Strips, 4 x 4 gauze,  and compressive hand dressing applied.  The patient tolerated the procedure  well and went to  recovery in stable fashion.                                               Artist Pais Mina Marble, M.D.    MAW/MEDQ  D:  09/15/2001  T:  09/16/2001  Job:  [id number]

## 2010-06-07 NOTE — Discharge Summary (Signed)
NAME:  [name] [name]                       ACCOUNT NO.:  [id number]   MEDICAL RECORD NO.:  [id number]                   PATIENT TYPE:  INP   LOCATION:  0445                                 FACILITY:  [facility]   PHYSICIAN:  [name] [name], M.D.            DATE OF BIRTH:  07-30-1980   DATE OF ADMISSION:  03/09/2003  DATE OF DISCHARGE:  03/11/2003                                 DISCHARGE SUMMARY   PRIMARY CARE PHYSICIAN:  Windle Guard, M.D.   DISCHARGE DIAGNOSES:  1. Cellulitis of the right arm following superficial heroin injection.  2. History of migraines.  3. History of anxiety.  4. Heroin abuse.  5. Gastroesophageal reflux disease.   DISCHARGE MEDICATIONS:  1. Augmentin 500 mg p.o. b.i.d. x10 days.  2. Dilaudid 4 mg p.o. q. 6h. p.r.n. total #40.  3. The patient will also continue her previous medications of amitriptyline     50 mg p.o. q.h.s., p.r.n. Zomig 5 mg, Klonopin 0.5 mg b.i.d., Nexium 40     mg p.o. daily.   HOSPITAL COURSE:  This is a 30 year old white female who occasionally uses  heroin. She injected herself in the right antecubital area but not into the  vein on Sunday, March 05, 2003.  She noticed redness and discomfort by  the following day. She presented to Dr. Jeannetta Nap' office. He prescribed  dicloxacillin 500 mg p.o. q. 6h.  She has been taking this regularly and  completed six doses; however, she continued to have increasing pain and  redness with intermittent fever. She was brought into the ER and evaluated  and she was felt to have a cellulitis with possible thrombophlebitis. She  was started on IV vancomycin as well as Rocephin.  The patient tolerated  this well. Her arm greatly decreased in erythema and size.  By the second  day, there is still some concern that she had some indurated areas and that  she may have a possible abscess. She was evaluated by surgery on the evening  of March 10, 2003 by Dr. Zachery Dakins of [facility].  After  evaluation, he felt that the patient did not have any evidence of abscess. A  followup visit in the morning on March 11, 2003 showed no further  evidence of abscess.  Her arm was less indurated and her edema was lessened  as well. There was no indication for surgery at this time.  The patient was  felt to be medically stable for discharge on March 11, 2003. She greatly  wanted to go home. She remains afebrile. She has no white count and was  greatly improved. Will change her over to p.o. antibiotics, Augmentin 500 mg  p.o. b.i.d.  It is noted that she has a stated allergy to PENICILLIN,  however, the patient was able to tolerate a full course of dicloxacillin  prior to admission.  Will continue Augmentin 500 p.o. b.i.d. x10 days. She  will  followup with Dr. Windle Guard sometime in the next 5-6 days. The  patient will be receiving p.r.n. Demerol for pain control. She did not  receive more than 40.  She is discharged on a regular diet, her activity  will be as tolerated. She was advised not to take any more drugs. She was  also advised that should her arm increase in pain, temperature spikes of  greater than  100 or increased erythema she is to return to the hospital. She says that  she understands this and will do so.  Her husband is also present and  comprehends this as well.   DISPOSITION:  Improved and she is being discharged to home.                                               [name] [name], M.D.    SKK/MEDQ  D:  03/11/2003  T:  03/11/2003  Job:  [id number]   cc:   Windle Guard, M.D.  [address]  [place], [place] [address]  Fax: [phone]   Anselm Pancoast. Zachery Dakins, M.D.  [address], [address]  [place]  [place] [address]  Fax: [phone]

## 2010-06-07 NOTE — Consult Note (Signed)
NAME:  KAITLYNN, TRAMONTANA             ACCOUNT NO.:  [id number]   MEDICAL RECORD NO.:  [id number]          PATIENT TYPE:  EMS   LOCATION:  ED                           FACILITY:  [facility]   PHYSICIAN:  Gustavus Messing. Orlin Hilding, M.D.DATE OF BIRTH:  12/16/1980   DATE OF CONSULTATION:  01/07/2005  DATE OF DISCHARGE:                                   CONSULTATION   REASON FOR CONSULTATION:  Headache.   CHIEF COMPLAINT:  Headache.   HISTORY OF PRESENT ILLNESS:  Ms. Thomasene Ripple is a 30 year old right-handed  woman, who was admitted for treatment of hyperemesis gravidarum.  She is  approximately [redacted] weeks pregnant.  She has a lifelong history of migraines  and has been followed just quite recently by Dr. Kelli Hope, seen by  him on December 25, 2004, just 13 days ago for his initial consultation.  She  reported a long history of migraines going back several years on various  medications, none of which have been helpful.  She has had increase in  frequency and severity of her headaches, which have been particularly severe  over the last few weeks.  She reports that she fell down the stairs a few  weeks ago, but does not think she hit her head.  The headache has persisted  around the base of the skull and around the ears.  She gets frequent severe  migraines, which start with an icepick sensation and become severe and  throbbing.  She has quite a lot of nausea and vomiting with these, which is  somewhat unusual for a migraine but may well be associated with her  pregnancy.  She was Inderal at one point and was also taking a lot of  Percocet and Fioricet.  She has been going to the emergency room to get  Dilaudid.  She had been referred to a high-risk obstetrician because of the  nausea and vomiting, and losing weight during her pregnancy.  She had an  unremarkable neurologic exam when she was seen by Dr. Thad Ranger, whose  impression was migraine worsened by her gravid state and recent fall,  perhaps  also worsened by dehydration, but no evidence of any secondary  cause; pregnancy, which limits her pharmacological options; and severe  anxiety.  She was given intravenous Solu-Medrol and a prednisone taper, and  he wanted her to come off of the acute medications, especially Percocet and  Esgic.  She had remained on the Inderal, also put her on riboflavin and  magnesium, Phenergan suppositories.  She came back a week later and saw  Darrol Angel, the nurse practitioner for Dr. Thad Ranger, having difficulty  sleeping, continuing to have anxiety.  She has another child, who is ill at  home.  She tried another prednisone 10 mg 6-day Dosepak, which will have  just finished at the time of this admission, and was told to increase her  magnesium, and then she was admitted.  Right now, she describes her headache  as a 6.  She also has some jaw tenderness at the TMJ and temple tenderness.  She apparently had a CT scan of the brain  done just on December 27, 2004,  which showed some sinus inflammatory disease, but no acute intracranial  abnormality.   REVIEW OF SYSTEMS:  Out of a 12-system review including general, neurologic,  psychiatric, genitourinary, GI, CV, ENT, respiratory, hematologic, and skin,  the following are positive:  Some trouble sleeping, weight loss, loss of  energy, fatigue, neck pain, leg numbness, blurring/double vision with her  headaches, dizziness, shaking, depression, anxiety, confusion, tobacco  problem, frequent urination, poor appetite, constipation, indigestion,  nausea, stomach pain, vomiting, chest pain, rapid heart beat, sinus  problems, shortness of breath.   PAST MEDICAL HISTORY:  Significant for a history of migraines off and on,  remote appendectomy and tonsillectomy.  She has a child.  She is unhappy in  life.  She does smoke still, and she had quit using alcohol and illicit  drugs in August of this year.  She has not been working.  She has been on  some bedrest  because of vaginal bleeding.   FAMILY HISTORY:  Positive for thyroid disease and migraine.   MEDICATIONS PRIOR TO ADMISSION:  1.  Inderal 40 mg b.i.d.  2.  Esgic p.r.n.  3.  Prenatal vitamins.  4.  She had been on Wellbutrin.  On admission, she was placed on:  1.  Zofran.  2.  GI cocktail.  3.  Ambien.  4.  Dilaudid.  5.  Phenergan.  6.  Zofran.  7.  Magnesium.  8.  Multivitamin.  9.  Potassium supplement.  10. Protonix.   ALLERGIES:  SHE LIST ALLERGIES TO IBUPROFEN, PENICILLIN, AND CODEINE.   OBJECTIVE:  VITAL SIGNS:  On exam, blood pressure 90/50, pulse 84,  temperature is 97.8.  HEENT:  Head is normocephalic, atraumatic.  NECK:  Supple.  NEUROLOGICAL:  She looks over sedated to me although she has previously  appeared anxious.  She is, however, oriented without any obvious mentation  deficits.  Cranial Nerves:  Her pupils are equal and reactive.  Visual  fields are full.  Extraocular movements are intact.  Facial sensation is  normal.  Facial motor activity normal.  Hearing is intact, palate is  symmetric and tongue is midline.  She has a pierced tongue, does not look  infected.  Motor Exam:  There is no drift.  She has normal bulk, tone and  strength throughout although with decreased effort.  Reflexes are 2+ and  symmetric.  Downgoing toes to plantar stimulation.  Coordination:  Finger-to-  nose and heel-to-shin are normal.  Sensory:  Intact.   CT of the brain was normal a week ago.   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  There is no evidence of a severe underlying neurological  problem.   RECOMMENDATIONS:  We will need to minimize narcotics.  She is not a good  candidate for DHE, triptans, and non-steroidals during her pregnancy.  She has already received steroids.   Would consider cyclobenzaprine 10 mg three  times a day if okay from an OB perspective.  Consider a bite-blocker  mouthguard at night.  Recommend a psych consult.      Catherine A. Orlin Hilding, M.D.  Electronically Signed     CAW/MEDQ  D:  01/07/2005  T:  01/07/2005  Job:  [id number]

## 2010-10-14 LAB — POCT URINALYSIS DIP (DEVICE)
Hgb urine dipstick: NEGATIVE
Ketones, ur: NEGATIVE
Protein, ur: NEGATIVE
Specific Gravity, Urine: 1.015
pH: 7

## 2010-10-14 LAB — URINALYSIS, ROUTINE W REFLEX MICROSCOPIC
Glucose, UA: NEGATIVE
Hgb urine dipstick: NEGATIVE
Ketones, ur: NEGATIVE
Protein, ur: NEGATIVE

## 2010-10-15 LAB — POCT URINALYSIS DIP (DEVICE)
Hgb urine dipstick: NEGATIVE
Ketones, ur: NEGATIVE
Ketones, ur: NEGATIVE
Protein, ur: NEGATIVE
Protein, ur: NEGATIVE
Specific Gravity, Urine: 1.01
Specific Gravity, Urine: >=1.03
pH: 7
pH: 5.5

## 2010-10-15 LAB — URINALYSIS, ROUTINE W REFLEX MICROSCOPIC
Bilirubin Urine: NEGATIVE
Glucose, UA: NEGATIVE
Ketones, ur: NEGATIVE
Leukocytes, UA: NEGATIVE
Specific Gravity, Urine: 1.025
pH: 6

## 2010-10-15 LAB — WET PREP, GENITAL
Trich, Wet Prep: NONE SEEN
Yeast Wet Prep HPF POC: NONE SEEN

## 2010-10-15 LAB — CBC
MCHC: 35.4
MCV: 88.5
Platelets: 252
RBC: 3.63 — ABNORMAL LOW
RDW: 12.8

## 2010-10-15 LAB — URINE MICROSCOPIC-ADD ON

## 2010-10-15 LAB — GC/CHLAMYDIA PROBE AMP, GENITAL
Chlamydia, DNA Probe: NEGATIVE
GC Probe Amp, Genital: NEGATIVE

## 2010-10-17 LAB — POCT URINALYSIS DIP (DEVICE)
Glucose, UA: NEGATIVE
Glucose, UA: 500 — AB
Glucose, UA: NEGATIVE
Nitrite: POSITIVE — AB
Nitrite: NEGATIVE
Nitrite: NEGATIVE
Operator id: 200901
Operator id: 297281
Operator id: 15968
Protein, ur: NEGATIVE
Protein, ur: NEGATIVE
Protein, ur: NEGATIVE
Specific Gravity, Urine: 1.01
Specific Gravity, Urine: <=1.005
Urobilinogen, UA: 0.2
Urobilinogen, UA: 0.2
Urobilinogen, UA: 0.2

## 2010-10-18 LAB — POCT URINALYSIS DIP (DEVICE)
Glucose, UA: NEGATIVE
Hgb urine dipstick: NEGATIVE
Hgb urine dipstick: NEGATIVE
Nitrite: NEGATIVE
Nitrite: NEGATIVE
Nitrite: POSITIVE — AB
Operator id: 297281
Protein, ur: NEGATIVE
Protein, ur: NEGATIVE
Protein, ur: NEGATIVE
Specific Gravity, Urine: <=1.005
Specific Gravity, Urine: <=1.005
Specific Gravity, Urine: 1.01
Urobilinogen, UA: 0.2
Urobilinogen, UA: 0.2
Urobilinogen, UA: 0.2
pH: 7

## 2010-10-21 LAB — POCT URINALYSIS DIP (DEVICE)
Hgb urine dipstick: NEGATIVE
Nitrite: NEGATIVE
Protein, ur: NEGATIVE
Specific Gravity, Urine: 1.015
Urobilinogen, UA: 0.2
pH: 7

## 2010-10-21 LAB — CBC
MCHC: 33.9
MCV: 89.3
Platelets: 410 — ABNORMAL HIGH
RBC: 3.93
RDW: 12.7

## 2010-10-21 LAB — RPR: RPR Ser Ql: NONREACTIVE

## 2010-10-21 LAB — RAPID URINE DRUG SCREEN, HOSP PERFORMED: Benzodiazepines: NOT DETECTED

## 2010-10-23 LAB — POCT URINALYSIS DIP (DEVICE)
Bilirubin Urine: NEGATIVE
Bilirubin Urine: NEGATIVE
Glucose, UA: 500 — AB
Glucose, UA: NEGATIVE
Glucose, UA: NEGATIVE
Nitrite: NEGATIVE
Nitrite: NEGATIVE
Operator id: 200901
Operator id: 200901
Operator id: 297281
Specific Gravity, Urine: <=1.005
Specific Gravity, Urine: <=1.005
Urobilinogen, UA: 0.2
Urobilinogen, UA: 0.2
Urobilinogen, UA: 1

## 2011-04-03 ENCOUNTER — Emergency Department (HOSPITAL_COMMUNITY)
Admission: EM | Admit: 2011-04-03 | Discharge: 2011-04-03 | Disposition: A | Discharge status: Home / Self Care | Payer: Medicaid Other | Attending: Emergency Medicine | Admitting: Emergency Medicine

## 2011-04-03 ENCOUNTER — Encounter (HOSPITAL_COMMUNITY): Payer: Self-pay | Admitting: *Deleted

## 2011-04-03 DIAGNOSIS — G43909 Migraine, unspecified, not intractable, without status migrainosus: Secondary | ICD-10-CM | POA: Insufficient documentation

## 2011-04-03 MED ORDER — DIPHENHYDRAMINE HCL 12.5 MG/5ML PO ELIX
25.0000 mg | ORAL_SOLUTION | Freq: Once | ORAL | Status: AC
  Administered 2011-04-03: 25 mg via ORAL

## 2011-04-03 MED ORDER — DEXAMETHASONE SODIUM PHOSPHATE 10 MG/ML IJ SOLN
10.0000 mg | Freq: Once | INTRAMUSCULAR | Status: AC
  Administered 2011-04-03: 10 mg via INTRAVENOUS
  Filled 2011-04-03: qty 1

## 2011-04-03 MED ORDER — HYDROMORPHONE HCL PF 1 MG/ML IJ SOLN
1.0000 mg | Freq: Once | INTRAMUSCULAR | Status: AC
  Administered 2011-04-03: 1 mg via INTRAVENOUS
  Filled 2011-04-03: qty 1

## 2011-04-03 MED ORDER — SODIUM CHLORIDE 0.9 % IV BOLUS (SEPSIS)
1000.0000 mL | Freq: Once | INTRAVENOUS | Status: AC
  Administered 2011-04-03: 1000 mL via INTRAVENOUS

## 2011-04-03 MED ORDER — METOCLOPRAMIDE HCL 5 MG/ML IJ SOLN
10.0000 mg | Freq: Once | INTRAMUSCULAR | Status: AC
  Administered 2011-04-03: 10 mg via INTRAVENOUS
  Filled 2011-04-03: qty 2

## 2011-04-03 NOTE — ED Provider Notes (Signed)
History     CSN: [id number]  Arrival date & time 04/03/11  0007   First MD Initiated Contact with Patient 04/03/11 0240      Chief Complaint  Patient presents with  . Headache    (Consider location/radiation/quality/duration/timing/severity/associated sxs/prior treatment) HPI This is a 31 year old white female with a history of migraines. She's had a severe migraine for the past 2 days. The pain is located behind the left eye which is consistent with prior migraines. She is sensitive to light and 7. She denies nausea or vomiting. There's been no focal neurologic deficit. She saw her primary care physician 2 days ago and was given a shot of morphine in the office and also took a shot of Imitrex at home; these only yielded transient partial relief. She requests something stronger in the ED. She states she previously had migraine several times a month but that since being on prophylactic amitriptyline she only has them several times a year.  Past Medical History  Diagnosis Date  . Migraine     History reviewed. No pertinent past surgical history.  History reviewed. No pertinent family history.  History  Substance Use Topics  . Smoking status: Not on file  . Smokeless tobacco: Not on file  . Alcohol Use:     OB History    Grav Para Term Preterm Abortions TAB SAB Ect Mult Living                  Review of Systems  All other systems reviewed and are negative.    Allergies  Ibuprofen and Penicillins  Home Medications   Current Outpatient Rx  Name Route Sig Dispense Refill  . AMITRIPTYLINE HCL 100 MG PO TABS Oral Take 100 mg by mouth at bedtime.    Marland Kitchen CLONAZEPAM 1 MG PO TABS Oral Take 1 mg by mouth 2 (two) times daily as needed.    Marland Kitchen LISDEXAMFETAMINE DIMESYLATE 70 MG PO CAPS Oral Take 70 mg by mouth every morning.      BP 122/78  Pulse 116  Temp(Src) 98.3 F (36.8 C) (Oral)  Resp 18  SpO2 99%  Physical Exam General: Well-developed, well-nourished female in no  acute distress; appearance consistent with age of record HENT: normocephalic, atraumatic Eyes: pupils equal round and reactive to light; extraocular muscles intact Neck: supple Heart: regular rate and rhythm Lungs: clear to auscultation bilaterally Abdomen: soft; nondistended; nontender Extremities: No deformity; full range of motion Neurologic: Awake, alert and oriented; motor function intact in all extremities and symmetric; no facial droop Skin: Warm and dry     ED Course  Procedures (including critical care time)     MDM  4:25 AM Feels better, ready to go home.        Hanley Seamen, MD 04/03/11 0425

## 2011-04-03 NOTE — ED Notes (Signed)
Patient given discharge instructions, information, prescriptions, and diet order. Patient states that they adequately understand discharge information given and to return to ED if symptoms return or worsen.     

## 2011-04-03 NOTE — Discharge Instructions (Signed)

## 2011-04-03 NOTE — ED Notes (Signed)
Pt in c/o migraine over last two days, history of same, denies n/v

## 2011-04-03 NOTE — ED Notes (Addendum)
Pt has hx of migraine headaches. States this is a typical headache for her. Pt states she went to [facility] yesterday and had a shot of morphine. Pt states she has a lot of stressors at home and headache came back worse today. Pt denies nausea at present.

## 2011-06-03 ENCOUNTER — Emergency Department (HOSPITAL_COMMUNITY)
Admission: EM | Admit: 2011-06-03 | Discharge: 2011-06-03 | Disposition: A | Discharge status: Home Health | Payer: Medicaid Other | Attending: Emergency Medicine | Admitting: Emergency Medicine

## 2011-06-03 ENCOUNTER — Encounter (HOSPITAL_COMMUNITY): Payer: Self-pay | Admitting: *Deleted

## 2011-06-03 DIAGNOSIS — R112 Nausea with vomiting, unspecified: Secondary | ICD-10-CM | POA: Insufficient documentation

## 2011-06-03 DIAGNOSIS — G43909 Migraine, unspecified, not intractable, without status migrainosus: Secondary | ICD-10-CM | POA: Insufficient documentation

## 2011-06-03 DIAGNOSIS — H53149 Visual discomfort, unspecified: Secondary | ICD-10-CM | POA: Insufficient documentation

## 2011-06-03 MED ORDER — PROMETHAZINE HCL 25 MG/ML IJ SOLN
25.0000 mg | Freq: Once | INTRAMUSCULAR | Status: AC
  Administered 2011-06-03: 25 mg via INTRAVENOUS
  Filled 2011-06-03: qty 1

## 2011-06-03 MED ORDER — DEXTROSE 5 % IV SOLN
500.0000 mg | INTRAVENOUS | Status: AC
  Administered 2011-06-03: 500 mg via INTRAVENOUS
  Filled 2011-06-03: qty 5

## 2011-06-03 MED ORDER — METOCLOPRAMIDE HCL 5 MG/ML IJ SOLN
10.0000 mg | Freq: Once | INTRAMUSCULAR | Status: AC
  Administered 2011-06-03: 10 mg via INTRAVENOUS
  Filled 2011-06-03: qty 2

## 2011-06-03 MED ORDER — DIPHENHYDRAMINE HCL 50 MG/ML IJ SOLN
25.0000 mg | Freq: Once | INTRAMUSCULAR | Status: AC
Start: 2011-06-03 — End: 2011-06-03
  Administered 2011-06-03: 25 mg via INTRAVENOUS
  Filled 2011-06-03: qty 1

## 2011-06-03 MED ORDER — SODIUM CHLORIDE 0.9 % IV BOLUS (SEPSIS)
1000.0000 mL | Freq: Once | INTRAVENOUS | Status: AC
  Administered 2011-06-03: 1000 mL via INTRAVENOUS

## 2011-06-03 MED ORDER — DEXAMETHASONE SODIUM PHOSPHATE 4 MG/ML IJ SOLN
4.0000 mg | Freq: Once | INTRAMUSCULAR | Status: AC
Start: 2011-06-03 — End: 2011-06-03
  Administered 2011-06-03: 4 mg via INTRAVENOUS
  Filled 2011-06-03: qty 1

## 2011-06-03 NOTE — Discharge Instructions (Signed)

## 2011-06-03 NOTE — ED Provider Notes (Signed)
History     CSN: [id number]  Arrival date & time 06/03/11  4098   First MD Initiated Contact with Patient 06/03/11 [phone]      Chief Complaint  Patient presents with  . Headache  . Nausea  . Emesis    (Consider location/radiation/quality/duration/timing/severity/associated sxs/prior treatment) Patient is a 31 y.o. female presenting with headaches and vomiting. The history is provided by the patient.  Headache  This is a recurrent problem. Associated symptoms include nausea and vomiting. Pertinent negatives include no shortness of breath.  Emesis  Associated symptoms include headaches. Pertinent negatives include no abdominal pain and no diarrhea.   patient has a history of migraines. She developed a severe headache in the temples with probably behind her eyes last night. She states she vomited multiple times. She threw up her Phenergan. She had no relief with her migraine nasal spray. She states this is among the worst migraine she has ever had. No fevers. No trauma. She states the headache is more severe but a typical headache. She also states she's not usually vomiting as much. No abdominal pain. No dysuria. She denies possibility of pregnancy. She states whenever she got in the ER last month worked well.  Past Medical History  Diagnosis Date  . Migraine     Past Surgical History  Procedure Date  . Appendectomy   . Tonsillectomy   . Tubal ligation     No family history on file.  History  Substance Use Topics  . Smoking status: Current Everyday Smoker  . Smokeless tobacco: Not on file  . Alcohol Use: No    OB History    Grav Para Term Preterm Abortions TAB SAB Ect Mult Living                  Review of Systems  Constitutional: Negative for activity change and appetite change.  HENT: Negative for neck stiffness.   Eyes: Positive for photophobia. Negative for pain.  Respiratory: Negative for chest tightness and shortness of breath.   Cardiovascular: Negative for  chest pain and leg swelling.  Gastrointestinal: Positive for nausea and vomiting. Negative for abdominal pain and diarrhea.  Genitourinary: Negative for flank pain.  Musculoskeletal: Negative for back pain.  Skin: Negative for rash.  Neurological: Positive for headaches. Negative for weakness and numbness.  Psychiatric/Behavioral: Negative for behavioral problems.    Allergies  Ibuprofen and Penicillins  Home Medications   Current Outpatient Rx  Name Route Sig Dispense Refill  . AMITRIPTYLINE HCL 100 MG PO TABS Oral Take 100 mg by mouth at bedtime.    Marland Kitchen CLONAZEPAM 1 MG PO TABS Oral Take 1 mg by mouth 2 (two) times daily as needed.    Marland Kitchen LISDEXAMFETAMINE DIMESYLATE 70 MG PO CAPS Oral Take 70 mg by mouth every morning.      BP 138/82  Pulse 100  Temp(Src) 98 F (36.7 C) (Oral)  Resp 16  SpO2 100%  LMP 05/27/2011  Physical Exam  Constitutional: She is oriented to person, place, and time. She appears well-developed and well-nourished.  HENT:  Head: Normocephalic.  Eyes: EOM are normal. Pupils are equal, round, and reactive to light.  Neck: Normal range of motion.       No meningeal signs.  Cardiovascular: Normal rate.   Pulmonary/Chest: Effort normal and breath sounds normal.  Abdominal: Soft. Bowel sounds are normal.  Musculoskeletal: Normal range of motion.  Neurological: She is alert and oriented to person, place, and time.  Skin: Skin is warm.  ED Course  Procedures (including critical care time)  Labs Reviewed - No data to display No results found.   1. Migraine       MDM  Patient nausea vomiting headache. Typical migraine, more severe. No relief with her medications at home. Patient asked for migraine cocktail. Patient was given the same medicines as the last visit, except that no dilaudid was given.  Patient states she's had no relief of her symptoms. She states although work is Dilaudid. Patient was informed that she'll not get Dilaudid from me. She  states that he has aren't doing anything for me. She then requested to see another doctor. She was informed that I was her doctor today. She is being loaded on Depakote. She'll now be given Phenergan.  Patient received IV Depakote. Before she was reevaluated by me she left because she states she had to go see her son.     Juliet Rude. Rubin Payor, MD 06/03/11 [phone]

## 2011-06-03 NOTE — ED Notes (Signed)
rn attempted to start another iv on pt. 2nd iv infiltrated. Pt states she does not want another iv started

## 2011-06-03 NOTE — ED Notes (Signed)
.  rn

## 2011-06-03 NOTE — ED Notes (Signed)
Pt reports history of migraines with pain at temples and behind eyes starting last night. Pt reports multiple episodes of vomiting last night.  Pt has tried taking phenergan, but has vomited up the medication.  Pt also tried using migraine nasal spray medication without relief.  Pt states this is the "worst migraine I have ever had."  Pt also reports photophobia.

## 2011-06-19 ENCOUNTER — Encounter (HOSPITAL_COMMUNITY): Payer: Self-pay

## 2011-06-19 ENCOUNTER — Other Ambulatory Visit (HOSPITAL_COMMUNITY): Payer: Self-pay | Admitting: Family Medicine

## 2011-06-19 ENCOUNTER — Ambulatory Visit (HOSPITAL_COMMUNITY)
Admission: RE | Admit: 2011-06-19 | Discharge: 2011-06-19 | Disposition: A | Discharge status: Home / Self Care | Payer: Medicaid Other | Source: Ambulatory Visit | Attending: Family Medicine | Admitting: Family Medicine

## 2011-06-19 DIAGNOSIS — R109 Unspecified abdominal pain: Secondary | ICD-10-CM | POA: Insufficient documentation

## 2011-06-19 DIAGNOSIS — R1031 Right lower quadrant pain: Secondary | ICD-10-CM

## 2011-06-19 DIAGNOSIS — R634 Abnormal weight loss: Secondary | ICD-10-CM | POA: Insufficient documentation

## 2011-06-19 MED ORDER — IOHEXOL 300 MG/ML  SOLN
80.0000 mL | Freq: Once | INTRAMUSCULAR | Status: AC | PRN
  Administered 2011-06-19: 80 mL via INTRAVENOUS

## 2011-06-20 ENCOUNTER — Inpatient Hospital Stay (HOSPITAL_COMMUNITY)
Admission: EM | Admit: 2011-06-20 | Discharge: 2011-06-24 | DRG: 815 | Disposition: A | Discharge status: Home / Self Care | Payer: Medicaid Other | Source: Ambulatory Visit | Attending: Internal Medicine | Admitting: Internal Medicine

## 2011-06-20 ENCOUNTER — Encounter (HOSPITAL_COMMUNITY): Payer: Self-pay | Admitting: Emergency Medicine

## 2011-06-20 ENCOUNTER — Emergency Department (HOSPITAL_COMMUNITY): Payer: Medicaid Other

## 2011-06-20 DIAGNOSIS — D72829 Elevated white blood cell count, unspecified: Principal | ICD-10-CM | POA: Diagnosis present

## 2011-06-20 DIAGNOSIS — E860 Dehydration: Secondary | ICD-10-CM | POA: Diagnosis present

## 2011-06-20 DIAGNOSIS — R1031 Right lower quadrant pain: Secondary | ICD-10-CM | POA: Diagnosis present

## 2011-06-20 DIAGNOSIS — R000 Tachycardia, unspecified: Secondary | ICD-10-CM

## 2011-06-20 DIAGNOSIS — R112 Nausea with vomiting, unspecified: Secondary | ICD-10-CM | POA: Diagnosis present

## 2011-06-20 DIAGNOSIS — E871 Hypo-osmolality and hyponatremia: Secondary | ICD-10-CM | POA: Diagnosis present

## 2011-06-20 DIAGNOSIS — F17200 Nicotine dependence, unspecified, uncomplicated: Secondary | ICD-10-CM | POA: Diagnosis present

## 2011-06-20 DIAGNOSIS — R109 Unspecified abdominal pain: Secondary | ICD-10-CM

## 2011-06-20 DIAGNOSIS — D649 Anemia, unspecified: Secondary | ICD-10-CM | POA: Diagnosis present

## 2011-06-20 LAB — BASIC METABOLIC PANEL
CO2: 23 mEq/L (ref 19–32)
Chloride: 98 mEq/L (ref 96–112)
Potassium: 3.7 mEq/L (ref 3.5–5.1)
Sodium: 134 mEq/L — ABNORMAL LOW (ref 135–145)

## 2011-06-20 LAB — URINALYSIS, ROUTINE W REFLEX MICROSCOPIC
Glucose, UA: NEGATIVE mg/dL
Leukocytes, UA: NEGATIVE
Protein, ur: NEGATIVE mg/dL
pH: 7 (ref 5.0–8.0)

## 2011-06-20 LAB — CBC
Platelets: 420 10*3/uL — ABNORMAL HIGH (ref 150–400)
RBC: 4.87 MIL/uL (ref 3.87–5.11)
WBC: 12.7 10*3/uL — ABNORMAL HIGH (ref 4.0–10.5)

## 2011-06-20 LAB — DIFFERENTIAL
Basophils Absolute: 0 10*3/uL (ref 0.0–0.1)
Eosinophils Absolute: 0 10*3/uL (ref 0.0–0.7)
Eosinophils Relative: 0 % (ref 0–5)
Lymphocytes Relative: 22 % (ref 12–46)
Monocytes Absolute: 0.7 10*3/uL (ref 0.1–1.0)

## 2011-06-20 MED ORDER — SODIUM CHLORIDE 0.9 % IV SOLN
INTRAVENOUS | Status: DC
  Administered 2011-06-21: via INTRAVENOUS
  Administered 2011-06-21: 125 mL via INTRAVENOUS
  Administered 2011-06-21 – 2011-06-22 (×2): via INTRAVENOUS
  Administered 2011-06-22 (×2): 125 mL via INTRAVENOUS
  Administered 2011-06-23 (×2): via INTRAVENOUS

## 2011-06-20 MED ORDER — LORAZEPAM 2 MG/ML IJ SOLN
1.0000 mg | Freq: Four times a day (QID) | INTRAMUSCULAR | Status: DC | PRN
  Administered 2011-06-21: 1 mg via INTRAVENOUS
  Filled 2011-06-20: qty 1

## 2011-06-20 MED ORDER — ONDANSETRON HCL 4 MG/2ML IJ SOLN: 4.0000 mg | Freq: Four times a day (QID) | INTRAMUSCULAR | Status: DC | PRN

## 2011-06-20 MED ORDER — OXYCODONE HCL 5 MG PO TABS
5.0000 mg | ORAL_TABLET | ORAL | Status: DC | PRN
  Administered 2011-06-21 – 2011-06-24 (×9): 5 mg via ORAL
  Filled 2011-06-20 (×10): qty 1

## 2011-06-20 MED ORDER — ALUM & MAG HYDROXIDE-SIMETH 200-200-20 MG/5ML PO SUSP: 30.0000 mL | Freq: Four times a day (QID) | ORAL | Status: DC | PRN

## 2011-06-20 MED ORDER — PANTOPRAZOLE SODIUM 40 MG IV SOLR
40.0000 mg | Freq: Two times a day (BID) | INTRAVENOUS | Status: DC
  Administered 2011-06-21 (×2): 40 mg via INTRAVENOUS
  Filled 2011-06-20 (×3): qty 40

## 2011-06-20 MED ORDER — TECHNETIUM TO 99M ALBUMIN AGGREGATED
3.0000 | Freq: Once | INTRAVENOUS | Status: AC | PRN
  Administered 2011-06-20: 3 via INTRAVENOUS

## 2011-06-20 MED ORDER — ONDANSETRON HCL 4 MG PO TABS: 4.0000 mg | ORAL_TABLET | Freq: Four times a day (QID) | ORAL | Status: DC | PRN

## 2011-06-20 MED ORDER — ACETAMINOPHEN 650 MG RE SUPP: 650.0000 mg | Freq: Four times a day (QID) | RECTAL | Status: DC | PRN

## 2011-06-20 MED ORDER — ACETAMINOPHEN 325 MG PO TABS
650.0000 mg | ORAL_TABLET | Freq: Four times a day (QID) | ORAL | Status: DC | PRN
  Administered 2011-06-23: 650 mg via ORAL
  Filled 2011-06-20: qty 2

## 2011-06-20 MED ORDER — METRONIDAZOLE IN NACL 5-0.79 MG/ML-% IV SOLN
500.0000 mg | Freq: Three times a day (TID) | INTRAVENOUS | Status: DC
  Administered 2011-06-21 (×2): 500 mg via INTRAVENOUS
  Filled 2011-06-20 (×5): qty 100

## 2011-06-20 MED ORDER — HYDROMORPHONE HCL PF 1 MG/ML IJ SOLN
0.5000 mg | INTRAMUSCULAR | Status: DC | PRN
  Administered 2011-06-21 – 2011-06-22 (×9): 1 mg via INTRAVENOUS
  Filled 2011-06-20 (×10): qty 1

## 2011-06-20 MED ORDER — SODIUM CHLORIDE 0.9 % IV BOLUS (SEPSIS)
1000.0000 mL | Freq: Once | INTRAVENOUS | Status: AC
  Administered 2011-06-20: 1000 mL via INTRAVENOUS

## 2011-06-20 MED ORDER — ZOLPIDEM TARTRATE 5 MG PO TABS
5.0000 mg | ORAL_TABLET | Freq: Every evening | ORAL | Status: DC | PRN
  Administered 2011-06-21: 5 mg via ORAL
  Filled 2011-06-20: qty 1

## 2011-06-20 MED ORDER — ENOXAPARIN SODIUM 40 MG/0.4ML ~~LOC~~ SOLN
40.0000 mg | SUBCUTANEOUS | Status: DC
  Administered 2011-06-21 – 2011-06-23 (×3): 40 mg via SUBCUTANEOUS
  Filled 2011-06-20 (×4): qty 0.4

## 2011-06-20 MED ORDER — MORPHINE SULFATE 4 MG/ML IJ SOLN
4.0000 mg | Freq: Once | INTRAMUSCULAR | Status: AC
  Administered 2011-06-20: 4 mg via INTRAVENOUS
  Filled 2011-06-20: qty 1

## 2011-06-20 MED ORDER — LORAZEPAM 2 MG/ML IJ SOLN
1.0000 mg | Freq: Once | INTRAMUSCULAR | Status: AC
  Administered 2011-06-20: 1 mg via INTRAVENOUS
  Filled 2011-06-20: qty 1

## 2011-06-20 MED ORDER — ONDANSETRON HCL 4 MG/2ML IJ SOLN
4.0000 mg | INTRAMUSCULAR | Status: DC | PRN
  Administered 2011-06-20: 4 mg via INTRAVENOUS
  Filled 2011-06-20: qty 2

## 2011-06-20 MED ORDER — CIPROFLOXACIN IN D5W 400 MG/200ML IV SOLN
400.0000 mg | Freq: Two times a day (BID) | INTRAVENOUS | Status: DC
  Administered 2011-06-21 (×2): 400 mg via INTRAVENOUS
  Filled 2011-06-20 (×3): qty 200

## 2011-06-20 NOTE — ED Notes (Signed)
Pt return from Nuclear Medicine. Pt denies nausea at this time, c/o back and hip pain related to laying on table during exam.

## 2011-06-20 NOTE — ED Provider Notes (Signed)
History     CSN: [id number]  Arrival date & time 06/20/11  1621   First MD Initiated Contact with Patient 06/20/11 1644      Chief Complaint  Patient presents with  . 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  She presents to the emergency department with a chief complaint of hyperemesis x3 weeks.  Patient was advised by her primary care or by your Dr. Jeannetta Nap to be evaluated in the emergency department due to a rising white count.  Dr. Jeannetta Nap states that her white count last week was 15 yesterday it rose to 18 and there was a shift to the left.  He did a CT abdomen pelvis concerning for her right lower quadrant pain that was negative for acute findings as well as a pelvic exam showing no abnormal discharge or cervical motion tenderness.  Associated symptoms include nausea, SOB, thoracic and lumbar back pain, migraines,15 pound weight loss in 3 weeks' time, and low-grade fevers from 99-100.  Patient denies any chest pain, pleurisy, cough, hemoptysis, numbness or tingling of extremities, difficulty with ambulation, bowel or bladder incontinence, cancer history, diarrhea, constipation, melena, hematochezia, dysuria, urinary frequency, fatigue heat or cold intolerance, excessive sweating, polyuria or polydipsia.  Patient has no other complaints at this time.  The history is provided by the patient.    Past Medical History  Diagnosis Date  . Migraine     Past Surgical History  Procedure Date  . Appendectomy   . Tonsillectomy   . Tubal ligation     No family history on file.  History  Substance Use Topics  . Smoking status: Current Everyday Smoker  . Smokeless tobacco: Not on file  . Alcohol Use: No    OB History    Grav Para Term Preterm Abortions TAB SAB  Ect Mult Living                  Review of Systems  All other systems reviewed and are negative.    Allergies  Ibuprofen and Penicillins  Home Medications   Current Outpatient Rx  Name Route Sig Dispense Refill  . AMITRIPTYLINE HCL 100 MG PO TABS Oral Take 100 mg by mouth at bedtime.    Marland Kitchen CLONAZEPAM 1 MG PO TABS Oral Take 1 mg by mouth 2 (two) times daily as needed. For anxiety    . LISDEXAMFETAMINE DIMESYLATE 70 MG PO CAPS Oral Take 70 mg by mouth every morning.    Marland Kitchen ZOLPIDEM TARTRATE 10 MG PO TABS Oral Take 10 mg by mouth at bedtime as needed. For sleep      BP 122/82  Pulse 146  Temp(Src) 98.6 F (37 C) (Oral)  Resp 18  SpO2 97%  LMP 06/17/2011  Physical Exam  Nursing note and vitals reviewed. Constitutional: She is oriented to person, place, and time. She appears well-developed and well-nourished. No distress.  HENT:  Head: Normocephalic and atraumatic.  Eyes: Conjunctivae and EOM are normal.  Neck: Normal range of motion.  Cardiovascular:       Tachycardic, no aberrancy on auscultation, distal pulses intact.  Pulmonary/Chest: Effort normal.       Lungs clear to auscultation bilaterally.  No respiratory distress, accessory muscle use, or chest tenderness to palpation.  Abdominal:       Abdomen soft, obese, with multiple striae.  Bowel sounds present.  Non-pulsatile aorta.  Right lower quadrant tenderness to palpation.  Musculoskeletal: Normal range of motion.       Patient able to ambulate without difficulty.  No pain with passive internal or external rotation of hips bilaterally.  Bony tenderness to palpation of the thoracic and lumbar spine as well as the SI area.    Neurological: She is alert and oriented to person, place, and time.       Strength 5 out of 5 bilaterally with normal DTRs in lower extremities.    Skin: Skin is warm and dry. No rash noted. She is not diaphoretic.  Psychiatric: She has a normal mood and affect. Her behavior is normal.    ED  Course  Procedures (including critical care time)  Labs Reviewed  CBC - Abnormal; Notable for the following:    WBC 12.7 (*)    Hemoglobin 15.1 (*)    Platelets 420 (*)    All other components within normal limits  BASIC METABOLIC PANEL  URINALYSIS, ROUTINE W REFLEX MICROSCOPIC  DIFFERENTIAL   Ct Abdomen Pelvis W Contrast  06/19/2011  *RADIOLOGY REPORT*  Clinical Data: Abdominal pain, weight loss  CT ABDOMEN AND PELVIS WITH CONTRAST  Technique:  Multidetector CT imaging of the abdomen and pelvis was performed following the standard protocol during bolus administration of intravenous contrast.  Contrast: 80mL OMNIPAQUE IOHEXOL 300 MG/ML  SOLN  Comparison: None.  Findings: Lung bases are clear.  No pericardial fluid.  No focal hepatic lesion.  The gallbladder, pancreas, spleen, adrenal glands, and kidneys are normal.  No obstructive uropathy.  The stomach, small bowel and colon are normal.    Surgical clips adjacent to the cecum relate to prior appendectomy.  No free fluid the pelvis.  The bladder and uterus are normal. There is a tampon in the vagina.  Ovaries are normal.  No free fluid the pelvis.  No pelvic lymphadenopathy. Review of  bone windows demonstrates no aggressive osseous lesions. Tubal ligation clips are noted.  IMPRESSION:  1.  No acute abdominal or pelvic findings. 2.  Appendectomy.  Original Report Authenticated By: Genevive Bi, M.D.     No diagnosis found.   Date: 06/20/2011  Rate: 119  Rhythm: Sinus Tachy   QRS Axis: normal  Intervals: normal  ST/T Wave abnormalities: normal  Conduction Disutrbances: none  Narrative Interpretation:   Old EKG Reviewed: Changes noted  Pt unable to get 20 gauge via IV team, 22 in place and receiving fluids. CTAngio canceled. Pt to get VQ scan for tachycardia and SOB. Zofran given for nausea and emesis.    MDM  Tachycardia, N/V/RLQ abd pain   Pt to be admitted for further work up and observation. [facility] Team 1 step down.          Jaci Carrel, [place] 06/20/11 2321

## 2011-06-20 NOTE — ED Notes (Signed)
Pt continues to c/o pain. MD aware.

## 2011-06-20 NOTE — ED Notes (Signed)
Pt to Nuclear Medicine via stretcher.

## 2011-06-20 NOTE — ED Notes (Signed)
C/o intermittent fever, lower back pain, hip pain, headaches, nausea and vomiting x 3 weeks.  States PCP has been checking labs each week and WBC is now elevated.  Reports 15 lb weight loss in 3 weeks.

## 2011-06-20 NOTE — ED Notes (Addendum)
Patient states she has nausea and vomiting and 15 pound weight loss and headaches and low grade fever of 99 to 100 and right side abdominal pain and right side flank pain over the last 3 weeks. PCP sent patient to [facility] for further evaluation. Patient states PCP order CT yesterday and CT was negative. Patient states that none of the nausea medications or pain mediation are working because she is unable to keep them down. Patient states she is unable to hold down anything, food or drinks. Family at bedside.

## 2011-06-20 NOTE — ED Notes (Signed)
Patient transported to X-ray 

## 2011-06-20 NOTE — Progress Notes (Signed)
Attempt PIV x 1 for CT scan.  Unable.  Poor veins.  No suitable veins for 20 ga IV.

## 2011-06-20 NOTE — ED Notes (Signed)
Performed AIDET 

## 2011-06-20 NOTE — ED Notes (Signed)
PT given crackers per request

## 2011-06-20 NOTE — ED Notes (Signed)
Pt tolerating po fluids and crackers well

## 2011-06-21 ENCOUNTER — Encounter (HOSPITAL_COMMUNITY): Payer: Self-pay

## 2011-06-21 DIAGNOSIS — R112 Nausea with vomiting, unspecified: Secondary | ICD-10-CM | POA: Diagnosis present

## 2011-06-21 DIAGNOSIS — R109 Unspecified abdominal pain: Secondary | ICD-10-CM | POA: Diagnosis present

## 2011-06-21 DIAGNOSIS — R000 Tachycardia, unspecified: Secondary | ICD-10-CM | POA: Diagnosis present

## 2011-06-21 DIAGNOSIS — D72829 Elevated white blood cell count, unspecified: Secondary | ICD-10-CM | POA: Diagnosis present

## 2011-06-21 LAB — BASIC METABOLIC PANEL WITH GFR
BUN: 7 mg/dL (ref 6–23)
CO2: 23 meq/L (ref 19–32)
Calcium: 8.2 mg/dL — ABNORMAL LOW (ref 8.4–10.5)
Chloride: 104 meq/L (ref 96–112)
Creatinine, Ser: 0.57 mg/dL (ref 0.50–1.10)
GFR calc Af Amer: >90 mL/min
GFR calc non Af Amer: >90 mL/min
Glucose, Bld: 121 mg/dL — ABNORMAL HIGH (ref 70–99)
Potassium: 3.5 meq/L (ref 3.5–5.1)
Sodium: 138 meq/L (ref 135–145)

## 2011-06-21 LAB — CBC
HCT: 35.1 % — ABNORMAL LOW (ref 36.0–46.0)
Hemoglobin: 11.6 g/dL — ABNORMAL LOW (ref 12.0–15.0)
MCH: 30.1 pg (ref 26.0–34.0)
MCHC: 33 g/dL (ref 30.0–36.0)
MCV: 91.2 fL (ref 78.0–100.0)
Platelets: 311 K/uL (ref 150–400)
RBC: 3.85 MIL/uL — ABNORMAL LOW (ref 3.87–5.11)
RDW: 13.6 % (ref 11.5–15.5)
WBC: 8.8 K/uL (ref 4.0–10.5)

## 2011-06-21 LAB — URINE DRUGS OF ABUSE SCREEN W ALC, ROUTINE (REF LAB)
Amphetamine Screen, Ur: NEGATIVE
Benzodiazepines.: NEGATIVE
Cocaine Metabolites: NEGATIVE
Marijuana Metabolite: NEGATIVE
Methadone: NEGATIVE
Propoxyphene: NEGATIVE

## 2011-06-21 LAB — MRSA PCR SCREENING: MRSA by PCR: NEGATIVE

## 2011-06-21 MED ORDER — ONDANSETRON HCL 4 MG PO TABS
4.0000 mg | ORAL_TABLET | ORAL | Status: DC | PRN
  Administered 2011-06-24: 4 mg via ORAL
  Filled 2011-06-21: qty 1

## 2011-06-21 MED ORDER — CLONAZEPAM 1 MG PO TABS
1.0000 mg | ORAL_TABLET | Freq: Two times a day (BID) | ORAL | Status: DC | PRN
  Administered 2011-06-21 – 2011-06-23 (×2): 1 mg via ORAL
  Filled 2011-06-21 (×2): qty 1

## 2011-06-21 MED ORDER — ZOLPIDEM TARTRATE 5 MG PO TABS
10.0000 mg | ORAL_TABLET | Freq: Every evening | ORAL | Status: DC | PRN
  Administered 2011-06-21 – 2011-06-23 (×3): 10 mg via ORAL
  Filled 2011-06-21 (×3): qty 2

## 2011-06-21 MED ORDER — AMITRIPTYLINE HCL 100 MG PO TABS
100.0000 mg | ORAL_TABLET | Freq: Every day | ORAL | Status: DC
  Administered 2011-06-21 – 2011-06-23 (×3): 100 mg via ORAL
  Filled 2011-06-21 (×5): qty 1

## 2011-06-21 MED ORDER — LISDEXAMFETAMINE DIMESYLATE 70 MG PO CAPS
70.0000 mg | ORAL_CAPSULE | Freq: Every day | ORAL | Status: DC
  Administered 2011-06-21 – 2011-06-23 (×3): 70 mg via ORAL
  Filled 2011-06-21 (×3): qty 1

## 2011-06-21 MED ORDER — ONDANSETRON HCL 4 MG/2ML IJ SOLN: 4.0000 mg | INTRAMUSCULAR | Status: DC | PRN

## 2011-06-21 MED ORDER — BUTALBITAL-APAP-CAFFEINE 50-325-40 MG PO TABS
1.0000 | ORAL_TABLET | Freq: Four times a day (QID) | ORAL | Status: DC | PRN
  Administered 2011-06-23: 2 via ORAL
  Filled 2011-06-21 (×2): qty 2

## 2011-06-21 MED ORDER — NICOTINE 21 MG/24HR TD PT24
21.0000 mg | MEDICATED_PATCH | Freq: Every day | TRANSDERMAL | Status: DC
  Administered 2011-06-21 – 2011-06-23 (×3): 21 mg via TRANSDERMAL
  Filled 2011-06-21 (×4): qty 1

## 2011-06-21 MED ORDER — METRONIDAZOLE 500 MG PO TABS
500.0000 mg | ORAL_TABLET | Freq: Three times a day (TID) | ORAL | Status: DC
  Administered 2011-06-21 – 2011-06-24 (×9): 500 mg via ORAL
  Filled 2011-06-21 (×15): qty 1

## 2011-06-21 MED ORDER — CIPROFLOXACIN HCL 500 MG PO TABS
500.0000 mg | ORAL_TABLET | Freq: Two times a day (BID) | ORAL | Status: DC
  Administered 2011-06-21 – 2011-06-23 (×6): 500 mg via ORAL
  Filled 2011-06-21 (×12): qty 1

## 2011-06-21 NOTE — Progress Notes (Signed)
INITIAL ADULT NUTRITION ASSESSMENT Date: 06/21/2011   Time: 1:27 PM Reason for Assessment: nutrition risk; wt loss  ASSESSMENT: Female 31 y.o.  Dx: N&V (nausea and vomiting)  Hx:  Past Medical History  Diagnosis Date  . Migraine    Past Surgical History  Procedure Date  . Appendectomy   . Tonsillectomy   . Tubal ligation    Related Meds:  Scheduled Meds:   . amitriptyline  100 mg Oral QHS  . ciprofloxacin  500 mg Oral BID  . enoxaparin  40 mg Subcutaneous Q24H  . lisdexamfetamine  70 mg Oral Daily  . LORazepam  1 mg Intravenous Once  . metroNIDAZOLE  500 mg Oral Q8H  .  morphine injection  4 mg Intravenous Once  . nicotine  21 mg Transdermal Daily  . sodium chloride  1,000 mL Intravenous Once  . sodium chloride  1,000 mL Intravenous Once  . DISCONTD: ciprofloxacin  400 mg Intravenous Q12H  . DISCONTD: metronidazole  500 mg Intravenous Q8H  . DISCONTD: pantoprazole (PROTONIX) IV  40 mg Intravenous Q12H   Continuous Infusions:   . sodium chloride 125 mL/hr at 06/21/11 0820   PRN Meds:.acetaminophen, acetaminophen, alum & mag hydroxide-simeth, butalbital-acetaminophen-caffeine, clonazePAM, HYDROmorphone (DILAUDID) injection, ondansetron (ZOFRAN) IV, ondansetron, oxyCODONE, technetium albumin aggregated, zolpidem, DISCONTD: LORazepam, DISCONTD: ondansetron, DISCONTD: ondansetron (ZOFRAN) IV, DISCONTD: ondansetron   Ht: 5\' 2"  (157.5 cm)  Wt: 151 lb 14.4 oz (68.901 kg)  Ideal Wt: 50 kg % Ideal Wt: 137%  Usual Wt: 160 lbs per pt % Usual Wt: 94%  Body mass index is 27.78 kg/(m^2).  Food/Nutrition Related Hx: 3 weeks of on/off nausea and vomiting result in 8-9 lbs wt loss per pt report  Labs:  CMP     Component Value Date/Time   NA 138 06/21/2011 0420   K 3.5 06/21/2011 0420   CL 104 06/21/2011 0420   CO2 23 06/21/2011 0420   GLUCOSE 121* 06/21/2011 0420   BUN 7 06/21/2011 0420   CREATININE 0.57 06/21/2011 0420   CALCIUM 8.2* 06/21/2011 0420   PROT 7.2 04/13/2009 1940   ALBUMIN 3.5 04/13/2009 1940   AST 13 04/13/2009 1940   ALT 21 04/13/2009 1940   ALKPHOS 60 04/13/2009 1940   BILITOT 0.2* 04/13/2009 1940   GFRNONAA >90 06/21/2011 0420   GFRAA >90 06/21/2011 0420    CBC    Component Value Date/Time   WBC 8.8 06/21/2011 0420   RBC 3.85* 06/21/2011 0420   HGB 11.6* 06/21/2011 0420   HCT 35.1* 06/21/2011 0420   PLT 311 06/21/2011 0420   MCV 91.2 06/21/2011 0420   MCH 30.1 06/21/2011 0420   MCHC 33.0 06/21/2011 0420   RDW 13.6 06/21/2011 0420   LYMPHSABS 2.8 06/20/2011 1635   MONOABS 0.7 06/20/2011 1635   EOSABS 0.0 06/20/2011 1635   BASOSABS 0.0 06/20/2011 1635    Intake: 100% clear liquid breakfast this am Output:   Intake/Output Summary (Last 24 hours) at 06/21/11 1328 Last data filed at 06/21/11 1215  Gross per 24 hour  Intake   4210 ml  Output   1825 ml  Net   2385 ml   No stool documented  Diet Order: Regular  Supplements/Tube Feeding: none at this time  IVF:    sodium chloride Last Rate: 125 mL/hr at 06/21/11 0820    Estimated Nutritional Needs:   Kcal: 1750-1920 kcal Protein: 68-82 g protein Fluid: >1.8 L/day  Pt states she has had nausea/vomiting on and off for the past 3  weeks resulting in 8-9 lbs wt loss.  Pt reports her usual wt to be 72.7 kg, currently she weighs 68.9 kg which represents a loss of 5.2% in 3 weeks.  This wt loss is not significant, however it is concerning.  Pt reports since treatment with anti-emetic overnight, nausea has resolved and she was able to tolerate clear liquids for breakfast.  At time of visit, pt is eating a hamburger with french fries and corn for lunch.  Reports tolerance thus far.  NUTRITION DIAGNOSIS: Unintended wt loss  RELATED TO: nausea, vomiting  AS EVIDENCE BY: pt with % wt loss in 3 weeks  MONITORING/EVALUATION(Goals): 1.  Food/Beverage; pt continuing to consume >75% of meals with tolerance 2.  Gastrointestinal; resolve of nausea/vomiting with diet tolerance.  EDUCATION NEEDS: -Education needs  addressed with pt  INTERVENTION: 1.  General healthful diet; discussed needs and nutrition-related care plan with pt.  Pt is attempting first solid meal since admission at time of visit.  No supplements ordered at this time.  Will continue to assess for need.  Pt agreeable to plan.  Dietitian #: [phone]  DOCUMENTATION CODES Per approved criteria  -Not Applicable    Loyce Dys [facility] 06/21/2011, 1:27 PM

## 2011-06-21 NOTE — H&P (Signed)
DATE OF ADMISSION:  06/20/2011  PCP:   Kaleen Mask, MD, MD   Chief Complaint: N+V and RLQ ABD PAin   HPI: [name] [name] is an 31 y.o. female who presents to the ED with complaints of Nausea and vomiting and Right lower Quadrant ABD pain X 3 weeks.  She had a CT scan of her ABD performed 1 day ago which was ordered by her PCP which did not reveal an  Acute process.    Past Medical History  Diagnosis Date  . Migraine     Past Surgical History  Procedure Date  . Appendectomy   . Tonsillectomy   . Tubal ligation     Medications:  HOME MEDS: Prior to Admission medications   Medication Sig Start Date End Date Taking? Authorizing Provider  amitriptyline (ELAVIL) 100 MG tablet Take 100 mg by mouth at bedtime.   Yes Historical Provider, MD  clonazePAM (KLONOPIN) 1 MG tablet Take 1 mg by mouth 2 (two) times daily as needed. For anxiety   Yes Historical Provider, MD  lisdexamfetamine (VYVANSE) 70 MG capsule Take 70 mg by mouth every morning.   Yes Historical Provider, MD  zolpidem (AMBIEN) 10 MG tablet Take 10 mg by mouth at bedtime as needed. For sleep   Yes Historical Provider, MD    Allergies:  Allergies  Allergen Reactions  . Ibuprofen Hives  . Penicillins Hives    Social History:   reports that she has been smoking.  She does not have any smokeless tobacco history on file. She reports that she does not drink alcohol or use illicit drugs.  Family History: Family History  Problem Relation Age of Onset  . Coronary artery disease Father   . Coronary artery disease Maternal Grandmother   . Breast cancer Mother   . Cancer Mother     Review of Systems: Positive for:  anorexia, fever, weight loss, abdominal pain, nausea and vomiting.   The patient denies vision loss, decreased hearing, hoarseness, chest pain, syncope, dyspnea on exertion, peripheral edema, balance deficits, hemoptysis, melena, hematochezia, severe indigestion/heartburn, diarrhea, constipation,   hematuria, incontinence, genital sores, muscle weakness, suspicious skin lesions, transient blindness, difficulty walking, depression, unusual weight change, abnormal bleeding, enlarged lymph nodes, angioedema, and breast masses.   Physical Exam:  GEN:  Pleasant 31 year old well nourished and well developed Caucasian female examined  and in discomfort but no acute distress; cooperative with exam Filed Vitals:   06/20/11 2145 06/20/11 2300 06/20/11 2321 06/21/11 0045  BP: 120/80  134/84 118/77  Pulse: 95 97 107 89  Temp:    98.8 F (37.1 C)  TempSrc:    Oral  Resp: 29 14  20   Height:    5\' 2"  (1.575 m)  Weight:    67.6 kg (149 lb 0.5 oz)  SpO2: 99% 97% 99% 97%   Blood pressure 118/77, pulse 89, temperature 98.8 F (37.1 C), temperature source Oral, resp. rate 20, height 5\' 2"  (1.575 m), weight 67.6 kg (149 lb 0.5 oz), last menstrual period 06/17/2011, SpO2 97.00%. PSYCH: She is alert and oriented x4; does not appear anxious does not appear depressed; affect is normal HEENT: Normocephalic and Atraumatic, Mucous membranes pink; PERRLA; EOM intact; Fundi:  Benign;  No scleral icterus, Nares: Patent, Oropharynx: Clear, Fair Dentition, Neck:  FROM, no cervical lymphadenopathy nor thyromegaly or carotid bruit; no JVD; Breasts:: Not examined CHEST WALL: No tenderness CHEST: Normal respiration, clear to auscultation bilaterally HEART: Regular rate and rhythm; no murmurs rubs or gallops  BACK: No kyphosis or scoliosis; no CVA tenderness ABDOMEN: Positive Bowel Sounds, soft mild tenderness in the RLQ area No rebound No Guarding, no masses, no organomegaly. Rectal Exam: Not done EXTREMITIES: No bone or joint deformity; age-appropriate arthropathy of the hands and knees; no cyanosis, clubbing or edema; no ulcerations. Genitalia: not examined PULSES: 2+ and symmetric SKIN: Normal hydration no rash or ulceration CNS: Cranial nerves 2-12 grossly intact no focal neurologic deficit   Labs &  Imaging Results for orders placed during the hospital encounter of 06/20/11 (from the past 48 hour(s))  CBC     Status: Abnormal   Collection Time   06/20/11  4:35 PM      Component Value Range Comment   WBC 12.7 (*) 4.0 - 10.5 (K/uL)    RBC 4.87  3.87 - 5.11 (MIL/uL)    Hemoglobin 15.1 (*) 12.0 - 15.0 (g/dL)    HCT 78.2  95.6 - 21.3 (%)    MCV 89.9  78.0 - 100.0 (fL)    MCH 31.0  26.0 - 34.0 (pg)    MCHC 34.5  30.0 - 36.0 (g/dL)    RDW 08.6  57.8 - 46.9 (%)    Platelets 420 (*) 150 - 400 (K/uL)   BASIC METABOLIC PANEL     Status: Abnormal   Collection Time   06/20/11  4:35 PM      Component Value Range Comment   Sodium 134 (*) 135 - 145 (mEq/L)    Potassium 3.7  3.5 - 5.1 (mEq/L)    Chloride 98  96 - 112 (mEq/L)    CO2 23  19 - 32 (mEq/L)    Glucose, Bld 109 (*) 70 - 99 (mg/dL)    BUN 9  6 - 23 (mg/dL)    Creatinine, Ser 6.29  0.50 - 1.10 (mg/dL)    Calcium 9.8  8.4 - 10.5 (mg/dL)    GFR calc non Af Amer >90  >90 (mL/min)    GFR calc Af Amer >90  >90 (mL/min)   DIFFERENTIAL     Status: Abnormal   Collection Time   06/20/11  4:35 PM      Component Value Range Comment   Neutrophils Relative 73  43 - 77 (%)    Neutro Abs 9.2 (*) 1.7 - 7.7 (K/uL)    Lymphocytes Relative 22  12 - 46 (%)    Lymphs Abs 2.8  0.7 - 4.0 (K/uL)    Monocytes Relative 5  3 - 12 (%)    Monocytes Absolute 0.7  0.1 - 1.0 (K/uL)    Eosinophils Relative 0  0 - 5 (%)    Eosinophils Absolute 0.0  0.0 - 0.7 (K/uL)    Basophils Relative 0  0 - 1 (%)    Basophils Absolute 0.0  0.0 - 0.1 (K/uL)   URINALYSIS, ROUTINE W REFLEX MICROSCOPIC     Status: Abnormal   Collection Time   06/20/11  5:14 PM      Component Value Range Comment   Color, Urine YELLOW  YELLOW     APPearance CLEAR  CLEAR     Specific Gravity, Urine 1.003 (*) 1.005 - 1.030     pH 7.0  5.0 - 8.0     Glucose, UA NEGATIVE  NEGATIVE (mg/dL)    Hgb urine dipstick NEGATIVE  NEGATIVE     Bilirubin Urine NEGATIVE  NEGATIVE     Ketones, ur NEGATIVE   NEGATIVE (mg/dL)    Protein, ur NEGATIVE  NEGATIVE (mg/dL)  Urobilinogen, UA 0.2  0.0 - 1.0 (mg/dL)    Nitrite NEGATIVE  NEGATIVE     Leukocytes, UA NEGATIVE  NEGATIVE  MICROSCOPIC NOT DONE ON URINES WITH NEGATIVE PROTEIN, BLOOD, LEUKOCYTES, NITRITE, OR GLUCOSE <1000 mg/dL.  POCT PREGNANCY, URINE     Status: Normal   Collection Time   06/20/11  5:23 PM      Component Value Range Comment   Preg Test, Ur NEGATIVE  NEGATIVE    T4, FREE     Status: Normal   Collection Time   06/20/11  6:08 PM      Component Value Range Comment   Free T4 1.40  0.80 - 1.80 (ng/dL)   TSH     Status: Abnormal   Collection Time   06/20/11  6:08 PM      Component Value Range Comment   TSH 0.306 (*) 0.350 - 4.500 (uIU/mL)    Dg Chest 2 View  06/20/2011  *RADIOLOGY REPORT*  Clinical Data: Tachycardia.  Dehydration.  Clinical concern for pulmonary embolism.  CHEST - 2 VIEW  Comparison: Report dated 10/18/2002.  Findings: Poor inspiration.  Normal sized heart.  Clear lungs. Mild central peribronchial thickening.  Mild scoliosis.  IMPRESSION: Mild bronchitic changes.  Original Report Authenticated By: Darrol Angel, M.D.   Nm Pulmonary Perfusion  06/20/2011  *RADIOLOGY REPORT*  Clinical Data: Tachycardia.  Clinical concern for pulmonary embolism.  Inadequate intravenous access for a chest CTA.  NM PULMONARY PERFUSION PARTICULATE  Radiopharmaceutical: CURIE MAA TECHNETIUM TO 9M ALBUMIN AGGREGATED  Comparison: Chest radiographs obtained earlier today.  Findings: Normal perfusion of both lungs with no perfusion defects.  IMPRESSION: Normal examination.  No evidence of pulmonary embolism.  Original Report Authenticated By: Darrol Angel, M.D.   Ct Abdomen Pelvis W Contrast  06/19/2011  *RADIOLOGY REPORT*  Clinical Data: Abdominal pain, weight loss  CT ABDOMEN AND PELVIS WITH CONTRAST  Technique:  Multidetector CT imaging of the abdomen and pelvis was performed following the standard protocol during bolus  administration of intravenous contrast.  Contrast: 80mL OMNIPAQUE IOHEXOL 300 MG/ML  SOLN  Comparison: None.  Findings: Lung bases are clear.  No pericardial fluid.  No focal hepatic lesion.  The gallbladder, pancreas, spleen, adrenal glands, and kidneys are normal.  No obstructive uropathy.  The stomach, small bowel and colon are normal.    Surgical clips adjacent to the cecum relate to prior appendectomy.  No free fluid the pelvis.  The bladder and uterus are normal. There is a tampon in the vagina.  Ovaries are normal.  No free fluid the pelvis.  No pelvic lymphadenopathy. Review of  bone windows demonstrates no aggressive osseous lesions. Tubal ligation clips are noted.  IMPRESSION:  1.  No acute abdominal or pelvic findings. 2.  Appendectomy.  Original Report Authenticated By: Genevive Bi, M.D.      Assessment: Present on Admission:  .N&V (nausea and vomiting) .Tachycardia .Abdominal pain .Leukocytosis   Plan:    Admit to Stepdown Bed due to Tachycardia Rehydration, Antiemtics PRN, and Pain control Blood Cultures X 2 and Empiric IV Cipro and Flagyl DVT prophylaxis Other plans as per orders.    CODE STATUS:      FULL CODE       Samuella Rasool C 06/21/2011, 1:20 AM

## 2011-06-21 NOTE — ED Provider Notes (Signed)
Medical screening examination/treatment/procedure(s) were conducted as a shared visit with non-physician practitioner(s) and myself.  I personally evaluated the patient during the encounter   Loren Racer, MD 06/21/11 [phone]

## 2011-06-21 NOTE — Progress Notes (Signed)
[facility] [place] TEAM 1 - Stepdown/ICU TEAM  PCP:  Kaleen Mask, MD, MD  Subjective: 31 y.o. female who presents to the ED with complaints of nausea and vomiting and right lower quadrant ABD pain X 3 weeks. She had a CT scan of her ABD performed 1 day ago which was ordered by her PCP which did not reveal an acute process.   F/U visit completed.  Pt asks for higher doses of narcotics, and also asks that they be provided at a more frequent interval, due to headache.    Objective:  Intake/Output Summary (Last 24 hours) at 06/21/11 1110 Last data filed at 06/21/11 1000  Gross per 24 hour  Intake   3960 ml  Output   1200 ml  Net   2760 ml   Blood pressure 119/71, pulse 90, temperature 98.5 F (36.9 C), temperature source Oral, resp. rate 18, height 5\' 2"  (1.575 m), weight 67.6 kg (149 lb 0.5 oz), last menstrual period 06/17/2011, SpO2 98.00%.  Physical Exam: F/U exam completed  Lab Results:  [facility] 06/21/11 0420 06/20/11 1635  NA 138 134*  K 3.5 3.7  CL 104 98  CO2 23 23  GLUCOSE 121* 109*  BUN 7 9  CREATININE 0.57 0.59  CALCIUM 8.2* 9.8  MG -- --  PHOS -- --   Basename 06/21/11 0420 06/20/11 1635  WBC 8.8 12.7*  NEUTROABS -- 9.2*  HGB 11.6* 15.1*  HCT 35.1* 43.8  MCV 91.2 89.9  PLT 311 420*   Micro Results: Recent Results (from the past 240 hour(s))  MRSA PCR SCREENING     Status: Normal   Collection Time   06/21/11  1:00 AM      Component Value Range Status Comment   MRSA by PCR NEGATIVE  NEGATIVE  Final    Studies/Results: All recent x-ray/radiology reports have been reviewed in detail.   Medications: I have reviewed the patient's complete medication list.  Assessment/Plan:  Nausea and vomiting / Abdominal pain No diarrhea, so I doubt this is an infectious colitis/processs - will complete 5 days of empiric abx, but change to oral   Tachycardia Much improved w/ volume resuscitation - no indication for ongoing tele or SDU  monitoring  Hx of heroin and cocaine abuse Has undergone tx at [facility] on numerous occasions - will be mindful of potential for drug seeking behavior - pt vehemently denies ongoing abuse, and reports that she completed a methadone tx program 3 months ago  Hyponatremia Resolved w/ volume resuscitation  Leukocytosis  Resolved  Low TSH Free T4 is normal - TSH was normal March 2011 - no further inpt eval  Anemia Likely due to menstruation - check anemia panel  Dispo Transfer to medical bed - will not escalate narcotics (explained this to pt) - use NSAIDs for HA - advance diet as tolerated  Lonia Blood, MD [facility] Office  [phone] Pager [phone]  On-Call/Text Page:      [email or web address]      password [facility]

## 2011-06-21 NOTE — Progress Notes (Signed)
[name] [name] is a 31 y.o. female patient who transferred  from 2600 awake, alert  & orientated  X 3, Full Code, VSS - Blood pressure 125/85, pulse 115, temperature 98.7 F (37.1 C), temperature source Oral, resp. rate 20, height 5\' 2"  (1.575 m), weight 68.901 kg (151 lb 14.4 oz), last menstrual period 06/17/2011, SpO2 99.00%.,, no c/o shortness of breath, no c/o chest pain, no distress noted.  IV site WDL: Allergies:   Allergies  Allergen Reactions  . Ibuprofen Hives  . Penicillins Hives     Past Medical History  Diagnosis Date  . Migraine     Pt orientation to unit, room and routine. SR up x 2, fall risk assessment complete with Patient and family verbalizing understanding of risks associated with falls. Pt verbalizes an understanding of how to use the call bell and to call for help before getting out of bed.  Skin, clean-dry- intact without evidence of bruising, or skin tears.   No evidence of skin break down noted on exam.     Will cont to monitor and assist as needed.  Cindra Eves, RN 06/21/2011 3:18 PM

## 2011-06-21 NOTE — ED Notes (Signed)
Pt informed of bed assignment. Pt states she is feeling "some better". Report called to the floor pt ready for transport via stretcher.

## 2011-06-22 DIAGNOSIS — R1031 Right lower quadrant pain: Secondary | ICD-10-CM

## 2011-06-22 DIAGNOSIS — R000 Tachycardia, unspecified: Secondary | ICD-10-CM

## 2011-06-22 DIAGNOSIS — D72829 Elevated white blood cell count, unspecified: Secondary | ICD-10-CM

## 2011-06-22 DIAGNOSIS — R112 Nausea with vomiting, unspecified: Secondary | ICD-10-CM

## 2011-06-22 LAB — RETICULOCYTES
RBC.: 3.89 MIL/uL (ref 3.87–5.11)
Retic Ct Pct: 1.7 % (ref 0.4–3.1)

## 2011-06-22 LAB — CBC
HCT: 35.2 % — ABNORMAL LOW (ref 36.0–46.0)
RDW: 13.3 % (ref 11.5–15.5)
WBC: 7 10*3/uL (ref 4.0–10.5)

## 2011-06-22 LAB — FERRITIN: Ferritin: 62 ng/mL (ref 10–291)

## 2011-06-22 LAB — FOLATE: Folate: 15.4 ng/mL

## 2011-06-22 LAB — VITAMIN B12: Vitamin B-12: 488 pg/mL (ref 211–911)

## 2011-06-22 LAB — IRON AND TIBC
Iron: 107 ug/dL (ref 42–135)
UIBC: 171 ug/dL (ref 125–400)

## 2011-06-22 MED ORDER — HYDROMORPHONE HCL PF 1 MG/ML IJ SOLN
0.5000 mg | INTRAMUSCULAR | Status: DC | PRN
  Administered 2011-06-22 – 2011-06-23 (×5): 1 mg via INTRAVENOUS
  Filled 2011-06-22 (×5): qty 1

## 2011-06-22 NOTE — Progress Notes (Signed)
Patient gave permission for use of aromatherapy for pain in right hip and lower back. Applied a mixture of pure essential oils, 6 drops of lavender, 6 drops of geranium and 6 drops of rosemary in 1 oz of vit E oil. Applied mixture and massaged in effected area for 1 minute. Will continue to monitor

## 2011-06-22 NOTE — Progress Notes (Addendum)
Subjective: C/o hip pain that has been ongoing Breakfast tray empty on bedside No nausea   Objective: Vital signs in last 24 hours: Filed Vitals:   06/21/11 1300 06/21/11 2058 06/22/11 0224 06/22/11 0528  BP: 125/85 122/81 130/92 118/83  Pulse: 115 113 130 101  Temp: 98.7 F (37.1 C) 98.1 F (36.7 C) 98.1 F (36.7 C) 98.3 F (36.8 C)  TempSrc: Oral Oral Oral Oral  Resp: 20 18 18 17   Height: 5\' 2"  (1.575 m)     Weight: 68.901 kg (151 lb 14.4 oz)     SpO2: 99% 99% 95% 99%   Weight change: 1.301 kg (2 lb 13.9 oz)  Intake/Output Summary (Last 24 hours) at 06/22/11 1138 Last data filed at 06/22/11 0600  Gross per 24 hour  Intake   2735 ml  Output    625 ml  Net   2110 ml    Physical Exam: General: Awake, Oriented, No acute distress. HEENT: EOMI. Neck: Supple CV: S1 and S2 Lungs: Clear to ascultation bilaterally Abdomen: Soft, Nontender, Nondistended, +bowel sounds. Ext: Good pulses. Trace edema.   Lab Results:  Basename 06/21/11 0420 06/20/11 1635  NA 138 134*  K 3.5 3.7  CL 104 98  CO2 23 23  GLUCOSE 121* 109*  BUN 7 9  CREATININE 0.57 0.59  CALCIUM 8.2* 9.8  MG -- --  PHOS -- --   No results found for this basename: AST:2,ALT:2,ALKPHOS:2,BILITOT:2,PROT:2,ALBUMIN:2 in the last 72 hours No results found for this basename: LIPASE:2,AMYLASE:2 in the last 72 hours  Basename 06/22/11 0720 06/21/11 0420 06/20/11 1635  WBC 7.0 8.8 --  NEUTROABS -- -- 9.2*  HGB 11.8* 11.6* --  HCT 35.2* 35.1* --  MCV 90.5 91.2 --  PLT 312 311 --   No results found for this basename: CKTOTAL:3,CKMB:3,CKMBINDEX:3,TROPONINI:3 in the last 72 hours No components found with this basename: POCBNP:3 No results found for this basename: DDIMER:2 in the last 72 hours No results found for this basename: HGBA1C:2 in the last 72 hours No results found for this basename: CHOL:2,HDL:2,LDLCALC:2,TRIG:2,CHOLHDL:2,LDLDIRECT:2 in the last 72 hours  Basename 06/20/11 1808  TSH 0.306*    T4TOTAL --  T3FREE --  THYROIDAB --    Basename 06/22/11 0720  VITAMINB12 --  FOLATE --  FERRITIN --  TIBC --  IRON --  RETICCTPCT 1.7    Micro Results: Recent Results (from the past 240 hour(s))  CULTURE, BLOOD (ROUTINE X 2)     Status: Normal (Preliminary result)   Collection Time   06/20/11 11:58 PM      Component Value Range Status Comment   Specimen Description BLOOD LEFT HAND   Final    Special Requests BOTTLES DRAWN AEROBIC AND ANAEROBIC [facility] EACH   Final    Culture  Setup Time [id number]   Final    Culture     Final    Value:        BLOOD CULTURE RECEIVED NO GROWTH TO DATE CULTURE WILL BE HELD FOR 5 DAYS BEFORE ISSUING A FINAL NEGATIVE REPORT   Report Status PENDING   Incomplete   CULTURE, BLOOD (ROUTINE X 2)     Status: Normal (Preliminary result)   Collection Time   06/21/11 12:10 AM      Component Value Range Status Comment   Specimen Description BLOOD RIGHT HAND   Final    Special Requests BOTTLES DRAWN AEROBIC AND ANAEROBIC [facility]   Final    Culture  Setup Time [id number]   Final  Culture     Final    Value:        BLOOD CULTURE RECEIVED NO GROWTH TO DATE CULTURE WILL BE HELD FOR 5 DAYS BEFORE ISSUING A FINAL NEGATIVE REPORT   Report Status PENDING   Incomplete   MRSA PCR SCREENING     Status: Normal   Collection Time   06/21/11  1:00 AM      Component Value Range Status Comment   MRSA by PCR NEGATIVE  NEGATIVE  Final     Studies/Results: Dg Chest 2 View  06/20/2011  *RADIOLOGY REPORT*  Clinical Data: Tachycardia.  Dehydration.  Clinical concern for pulmonary embolism.  CHEST - 2 VIEW  Comparison: Report dated 10/18/2002.  Findings: Poor inspiration.  Normal sized heart.  Clear lungs. Mild central peribronchial thickening.  Mild scoliosis.  IMPRESSION: Mild bronchitic changes.  Original Report Authenticated By: Darrol Angel, M.D.   Nm Pulmonary Perfusion  06/20/2011  *RADIOLOGY REPORT*  Clinical Data: Tachycardia.  Clinical concern for pulmonary  embolism.  Inadequate intravenous access for a chest CTA.  NM PULMONARY PERFUSION PARTICULATE  Radiopharmaceutical: CURIE MAA TECHNETIUM TO 40M ALBUMIN AGGREGATED  Comparison: Chest radiographs obtained earlier today.  Findings: Normal perfusion of both lungs with no perfusion defects.  IMPRESSION: Normal examination.  No evidence of pulmonary embolism.  Original Report Authenticated By: Darrol Angel, M.D.    Medications: I have reviewed the patient's current medications. Scheduled Meds:   . amitriptyline  100 mg Oral QHS  . ciprofloxacin  500 mg Oral BID  . enoxaparin  40 mg Subcutaneous Q24H  . lisdexamfetamine  70 mg Oral Daily  . metroNIDAZOLE  500 mg Oral Q8H  . nicotine  21 mg Transdermal Daily   Continuous Infusions:   . sodium chloride 125 mL (06/22/11 1029)   PRN Meds:.acetaminophen, acetaminophen, alum & mag hydroxide-simeth, butalbital-acetaminophen-caffeine, clonazePAM, HYDROmorphone (DILAUDID) injection, ondansetron (ZOFRAN) IV, ondansetron, oxyCODONE, zolpidem, DISCONTD:  HYDROmorphone (DILAUDID) injection, DISCONTD: zolpidem  Assessment/Plan: Nausea and vomiting / Abdominal pain No diarrhea, so I doubt this is an infectious colitis/processs - will complete 5 days of empiric abx, but change to oral - day #2  Tachycardia Much improved w/ volume resuscitation (patient was not taking klonopin at home when vomiting- ?withdrawal)  Hx of heroin and cocaine abuse  Has undergone tx at [facility] on numerous occasions - will be mindful of potential for drug seeking behavior - pt vehemently denies ongoing abuse, and reports that she completed a methadone tx program 3 months ago   Hyponatremia  Resolved w/ volume resuscitation   Leukocytosis  Resolved   Low TSH  Free T4 is normal - TSH was normal March 2011 - no further inpt eval   Anemia  Likely due to menstruation and IVF  Hope to D/C tomm with PO abx    LOS: 2 days  Pheonix Clinkscale, DO 06/22/2011, 11:38 AM

## 2011-06-23 LAB — GC/CHLAMYDIA PROBE AMP, URINE: GC Probe Amp, Urine: NEGATIVE

## 2011-06-23 MED ORDER — BUTALBITAL-APAP-CAFFEINE 50-325-40 MG PO TABS
1.0000 | ORAL_TABLET | Freq: Four times a day (QID) | ORAL | Status: AC | PRN
Start: 2011-06-23 — End: 2011-07-03

## 2011-06-23 MED ORDER — PANTOPRAZOLE SODIUM 40 MG PO TBEC
40.0000 mg | DELAYED_RELEASE_TABLET | Freq: Every day | ORAL | Status: DC
  Administered 2011-06-23: 40 mg via ORAL
  Filled 2011-06-23: qty 1

## 2011-06-23 MED ORDER — CIPROFLOXACIN HCL 500 MG PO TABS: 500.0000 mg | ORAL_TABLET | Freq: Two times a day (BID) | ORAL | Status: AC

## 2011-06-23 MED ORDER — METRONIDAZOLE 500 MG PO TABS: 500.0000 mg | ORAL_TABLET | Freq: Three times a day (TID) | ORAL | Status: AC

## 2011-06-23 MED ORDER — DICLOFENAC SODIUM 50 MG PO TBEC
50.0000 mg | DELAYED_RELEASE_TABLET | Freq: Three times a day (TID) | ORAL | Status: DC
  Administered 2011-06-23 – 2011-06-24 (×2): 50 mg via ORAL
  Filled 2011-06-23 (×4): qty 1

## 2011-06-23 MED ORDER — CYCLOBENZAPRINE HCL 10 MG PO TABS
10.0000 mg | ORAL_TABLET | Freq: Once | ORAL | Status: AC
  Administered 2011-06-23: 10 mg via ORAL
  Filled 2011-06-23: qty 1

## 2011-06-23 NOTE — Discharge Summary (Signed)
-  I have reviewed the data above and agree with the plan. -Continue Cipro Flagyl, followup with her PCP in 2-4 weeks.

## 2011-06-23 NOTE — Discharge Instructions (Signed)
Slowly advance your diet.  You may need to drink only liquids for awhile before advancing to solid food.  If you feel sick again please call Dr. Jeannetta Nap office for guidance.

## 2011-06-23 NOTE — Discharge Summary (Addendum)
Patient ID: [name] [name] MRN: [id number] DOB/AGE: 07-05-1980 31 y.o.  Admit date: 06/20/2011 Discharge date: 06/24/2011    Primary Care Physician:  Kaleen Mask, MD, MD Discharge Diagnoses:   Present on Admission:  .N&V (nausea and vomiting) .Tachycardia .Abdominal pain .Leukocytosis *Concern for narcotic seeking behavior.   Medication List  As of 06/24/2011 11:00 AM   TAKE these medications         amitriptyline 100 MG tablet   Commonly known as: ELAVIL   Take 100 mg by mouth at bedtime.      butalbital-acetaminophen-caffeine 50-325-40 MG per tablet   Commonly known as: FIORICET, ESGIC   Take 1-2 tablets by mouth every 6 (six) hours as needed (Use only for severe headache, may cause rebound headache).      ciprofloxacin 500 MG tablet   Commonly known as: CIPRO   Take 1 tablet (500 mg total) by mouth 2 (two) times daily.      clonazePAM 1 MG tablet   Commonly known as: KLONOPIN   Take 1 mg by mouth 2 (two) times daily as needed. For anxiety      cyclobenzaprine 10 MG tablet   Commonly known as: FLEXERIL   Take 1 tablet (10 mg total) by mouth 2 (two) times daily as needed for muscle spasms.      lisdexamfetamine 70 MG capsule   Commonly known as: VYVANSE   Take 70 mg by mouth every morning.      metroNIDAZOLE 500 MG tablet   Commonly known as: FLAGYL   Take 1 tablet (500 mg total) by mouth every 8 (eight) hours. Do not drink alcohol with this medication. (it will cause severe stomach upset)      ondansetron 8 MG tablet   Commonly known as: ZOFRAN   Take 1 tablet (8 mg total) by mouth every 8 (eight) hours as needed for nausea.      oxyCODONE-acetaminophen 5-325 MG per tablet   Commonly known as: PERCOCET   Take 1 tablet by mouth every 4 (four) hours as needed for pain.      pantoprazole 40 MG tablet   Commonly known as: PROTONIX   Take 1 tablet (40 mg total) by mouth daily at 12 noon.      zolpidem 10 MG tablet   Commonly known as: AMBIEN   Take 10 mg  by mouth at bedtime as needed. For sleep            Brief H and P:  Peri Kreft is an 31 y.o. female who presents to the ED with complaints of Nausea and vomiting and Right lower Quadrant ABD pain X 3 weeks. She had a CT scan of her ABD performed 1 day ago which was ordered by her PCP which did not reveal an Acute process.  The patient was found to be dehydrated and tachycardic.  She was initially admitted to the step down unit for tachycardia.  Hospital course: Nausea/vomiting/abdominal pain of unclear etiology: -CT Abdomen/pelvis negative.  Patient has had no further vomiting over the last 24 hours.  She is tolerating a solid diet.  She is being treated with empiric cipro/flagyl po for possibly colitis.  -The patient was originally discharged on 06/23/11, but refused to leave the hospital.  She complained of right lower back and stomach pain and continued to request increased pain medication.  During several lengthy conversations between the patient and her providers we discussed narcotic dependence (which the patient adamantly denied) and possible GYN issues for which  she should seek outpatient GYN follow up.  The patient requested inpatient transfer to [facility] which we refused as there was no acute physical reason for further hospitalization at [facility] or [facility].  The patient was discharge again on 06/24/11. Tachycardia.  Later in the evening after admission, the patient's tachycardia was much improved due to IV fluids.  It was felt that she no longer needed step down level of care and she was transferred to a bed on the 5500 unit.  Her pulse remains within normal limits.  Hyponatremia.  Resolved with rehydration.  Anemia.  Mild after rehydration.  Possibly due to menstruation.  Anemia panel generally within normal limits.  Stable for out patient follow up.  Right hip and back pain.  Muscle strain.  Recommend OTC pain medications.   Physical Exam on Discharge: General: Alert, awake,  oriented x3, in no acute distress. HEENT: No bruits, no goiter. Heart: Tachy rate and regular rhythm, without murmurs, rubs, gallops. Lungs: Clear to auscultation bilaterally. Abdomen: 0bese, Soft, nontender, nondistended, positive bowel sounds. Extremities: No clubbing cyanosis or edema with positive pedal pulses. Neuro: Grossly intact, nonfocal.  Filed Vitals:   06/23/11 1319 06/23/11 2037 06/24/11 0449 06/24/11 1007  BP: 153/105 131/92 112/76 133/93  Pulse: 133 123 98 112  Temp: 98.2 F (36.8 C) 98.7 F (37.1 C) 97.4 F (36.3 C) 98.8 F (37.1 C)  TempSrc: Oral Oral Oral Oral  Resp: 20 20 17 22   Height:      Weight:      SpO2: 100% 100% 99% 99%     Intake/Output Summary (Last 24 hours) at 06/24/11 1100 Last data filed at 06/24/11 0950  Gross per 24 hour  Intake   1370 ml  Output    250 ml  Net   1120 ml    Basic Metabolic Panel:  Lab 06/21/11 1610 06/20/11 1635  NA 138 134*  K 3.5 3.7  CL 104 98  CO2 23 23  GLUCOSE 121* 109*  BUN 7 9  CREATININE 0.57 0.59  CALCIUM 8.2* 9.8  MG -- --  PHOS -- --   CBC:  Lab 06/22/11 0720 06/21/11 0420 06/20/11 1635  WBC 7.0 8.8 --  NEUTROABS -- -- 9.2*  HGB 11.8* 11.6* --  HCT 35.2* 35.1* --  MCV 90.5 91.2 --  PLT 312 311 --   Hemoglobin A1C:  Lab 06/22/11 0720  HGBA1C 5.5   Thyroid Function Tests:  Lab 06/20/11 1808  TSH 0.306*  T4TOTAL --  FREET4 1.40  T3FREE --  THYROIDAB --   Anemia Panel:  Lab 06/22/11 0720  VITAMINB12 488  FOLATE 15.4  FERRITIN 62  TIBC 278  IRON 107  RETICCTPCT 1.7   Urine Drug Screen: Drugs of Abuse     Component Value Date/Time   LABOPIA NEGATIVE 06/20/2011 1714   LABOPIA POSITIVE* 06/06/2009 1538   COCAINSCRNUR NEGATIVE 06/20/2011 1714   COCAINSCRNUR NONE DETECTED 06/06/2009 1538   LABBENZ NEGATIVE 06/20/2011 1714   LABBENZ POSITIVE* 06/06/2009 1538   AMPHETMU NEGATIVE 06/20/2011 1714   AMPHETMU NONE DETECTED 06/06/2009 1538   THCU NONE DETECTED 06/06/2009 1538   LABBARB   Value: NONE DETECTED        DRUG SCREEN FOR MEDICAL PURPOSES ONLY.  IF CONFIRMATION IS NEEDED FOR ANY PURPOSE, NOTIFY LAB WITHIN 5 DAYS.        LOWEST DETECTABLE LIMITS FOR URINE DRUG SCREEN Drug Class       Cutoff (ng/mL) Amphetamine      1000  Barbiturate      200 Benzodiazepine   200 Tricyclics       300 Opiates          300 Cocaine          300 THC              50 06/06/2009 1538     Micro Results: Results for orders placed during the hospital encounter of 06/20/11  CULTURE, BLOOD (ROUTINE X 2)     Status: Normal (Preliminary result)   Collection Time   06/20/11 11:58 PM      Component Value Range Status Comment   Specimen Description BLOOD LEFT HAND   Final    Special Requests BOTTLES DRAWN AEROBIC AND ANAEROBIC [facility] EACH   Final    Culture  Setup Time [id number]   Final    Culture     Final    Value:        BLOOD CULTURE RECEIVED NO GROWTH TO DATE CULTURE WILL BE HELD FOR 5 DAYS BEFORE ISSUING A FINAL NEGATIVE REPORT   Report Status PENDING   Incomplete   CULTURE, BLOOD (ROUTINE X 2)     Status: Normal (Preliminary result)   Collection Time   06/21/11 12:10 AM      Component Value Range Status Comment   Specimen Description BLOOD RIGHT HAND   Final    Special Requests BOTTLES DRAWN AEROBIC AND ANAEROBIC [facility] EACH   Final    Culture  Setup Time [id number]   Final    Culture     Final    Value:        BLOOD CULTURE RECEIVED NO GROWTH TO DATE CULTURE WILL BE HELD FOR 5 DAYS BEFORE ISSUING A FINAL NEGATIVE REPORT   Report Status PENDING   Incomplete   MRSA PCR SCREENING     Status: Normal   Collection Time   06/21/11  1:00 AM      Component Value Range Status Comment   MRSA by PCR NEGATIVE  NEGATIVE  Final     Other Pertinent Lab Results:    Significant Diagnostic Studies:  Dg Chest 2 View  06/20/2011  *RADIOLOGY REPORT*  Clinical Data: Tachycardia.  Dehydration.  Clinical concern for pulmonary embolism.  CHEST - 2 VIEW  Comparison: Report dated 10/18/2002.  Findings: Poor  inspiration.  Normal sized heart.  Clear lungs. Mild central peribronchial thickening.  Mild scoliosis.  IMPRESSION: Mild bronchitic changes.  Original Report Authenticated By: Darrol Angel, M.D.   Nm Pulmonary Perfusion  06/20/2011  *RADIOLOGY REPORT*  Clinical Data: Tachycardia.  Clinical concern for pulmonary embolism.  Inadequate intravenous access for a chest CTA.  NM PULMONARY PERFUSION PARTICULATE  Radiopharmaceutical: CURIE MAA TECHNETIUM TO 60M ALBUMIN AGGREGATED  Comparison: Chest radiographs obtained earlier today.  Findings: Normal perfusion of both lungs with no perfusion defects.  IMPRESSION: Normal examination.  No evidence of pulmonary embolism.  Original Report Authenticated By: Darrol Angel, M.D.   Ct Abdomen Pelvis W Contrast  06/19/2011  *RADIOLOGY REPORT*  Clinical Data: Abdominal pain, weight loss  CT ABDOMEN AND PELVIS WITH CONTRAST  Technique:  Multidetector CT imaging of the abdomen and pelvis was performed following the standard protocol during bolus administration of intravenous contrast.  Contrast: 80mL OMNIPAQUE IOHEXOL 300 MG/ML  SOLN  Comparison: None.  Findings: Lung bases are clear.  No pericardial fluid.  No focal hepatic lesion.  The gallbladder, pancreas, spleen, adrenal glands, and kidneys are normal.  No obstructive uropathy.  The stomach, small bowel and colon are normal.    Surgical clips adjacent to the cecum relate to prior appendectomy.  No free fluid the pelvis.  The bladder and uterus are normal. There is a tampon in the vagina.  Ovaries are normal.  No free fluid the pelvis.  No pelvic lymphadenopathy. Review of  bone windows demonstrates no aggressive osseous lesions. Tubal ligation clips are noted.  IMPRESSION:  1.  No acute abdominal or pelvic findings. 2.  Appendectomy.  Original Report Authenticated By: Genevive Bi, M.D.      Disposition and Follow-up:  Stable for discharge to home with Primary Care follow up.  Discharge Orders    Future  Orders Please Complete By Expires   Diet - low sodium heart healthy      Diet - low sodium heart healthy      Increase activity slowly      Increase activity slowly        Follow-up Information    Follow up with Kaleen Mask, MD. Schedule an appointment as soon as possible for a visit in 1 week.   Contact information:   [address] [place] [address] [phone]           Time spent on Discharge: 40 min.  SignedStephani Police 06/24/2011, 11:00 AM [phone]

## 2011-06-24 DIAGNOSIS — D72829 Elevated white blood cell count, unspecified: Secondary | ICD-10-CM

## 2011-06-24 DIAGNOSIS — R112 Nausea with vomiting, unspecified: Secondary | ICD-10-CM

## 2011-06-24 DIAGNOSIS — R000 Tachycardia, unspecified: Secondary | ICD-10-CM

## 2011-06-24 DIAGNOSIS — R1031 Right lower quadrant pain: Secondary | ICD-10-CM

## 2011-06-24 MED ORDER — PANTOPRAZOLE SODIUM 40 MG PO TBEC: 40.0000 mg | DELAYED_RELEASE_TABLET | Freq: Every day | ORAL | Status: DC

## 2011-06-24 MED ORDER — OXYCODONE-ACETAMINOPHEN 5-325 MG PO TABS: 1.0000 | ORAL_TABLET | ORAL | Status: AC | PRN

## 2011-06-24 MED ORDER — ONDANSETRON HCL 8 MG PO TABS: 8.0000 mg | ORAL_TABLET | Freq: Three times a day (TID) | ORAL | Status: AC | PRN

## 2011-06-24 MED ORDER — CYCLOBENZAPRINE HCL 10 MG PO TABS: 10.0000 mg | ORAL_TABLET | Freq: Two times a day (BID) | ORAL | Status: DC | PRN

## 2011-06-24 MED ORDER — CYCLOBENZAPRINE HCL 10 MG PO TABS: 10.0000 mg | ORAL_TABLET | Freq: Two times a day (BID) | ORAL | Status: AC | PRN

## 2011-06-24 MED ORDER — ONDANSETRON HCL 4 MG PO TABS: 24.0000 mg | ORAL_TABLET | Freq: Three times a day (TID) | ORAL | Status: DC | PRN

## 2011-06-24 NOTE — Progress Notes (Signed)
0830 Patient very tearful  Regarding pain medication she is prescribe Dr. Radonna Ricker made aware.

## 2011-06-24 NOTE — Care Management Note (Signed)
    Page 1 of 1   06/24/2011     11:45:28 AM   CARE MANAGEMENT NOTE 06/24/2011  Patient:  [name] [name], [name] [name]   Account Number:  [id number]  Date Initiated:  06/24/2011  Documentation initiated by:  Letha Cape  Subjective/Objective Assessment:   dx n/v  admit as observation- lives with spouse.     Action/Plan:   Anticipated DC Date:  06/24/2011   Anticipated DC Plan:  HOME/SELF CARE      DC Planning Services  CM consult      Choice offered to / List presented to:             Status of service:  Completed, signed off Medicare Important Message given?   (If response is "NO", the following Medicare IM given date fields will be blank) Date Medicare IM given:   Date Additional Medicare IM given:    Discharge Disposition:  HOME/SELF CARE  Per UR Regulation:  Reviewed for med. necessity/level of care/duration of stay  If discussed at Long Length of Stay Meetings, dates discussed:    Comments:  06/24/11 11:44 Letha Cape RN, BSN [phone] patient lives with spouse, pta independent, no needs anticipated.  Patient dc to home.

## 2011-06-24 NOTE — Progress Notes (Signed)
Discharge  Instructions reviewed with patient verbalize and understands.Prescriptions given to patient. Skin WNL except some scattered bruises noted on arm.

## 2011-06-27 LAB — CULTURE, BLOOD (ROUTINE X 2)
Culture  Setup Time: 201306011151
Culture  Setup Time: 201306011151
Culture: NO GROWTH

## 2011-07-30 ENCOUNTER — Other Ambulatory Visit: Payer: Self-pay | Admitting: Obstetrics and Gynecology

## 2011-08-07 ENCOUNTER — Encounter (HOSPITAL_COMMUNITY): Payer: Self-pay | Admitting: Pharmacist

## 2011-08-12 ENCOUNTER — Inpatient Hospital Stay (HOSPITAL_COMMUNITY)
Admission: AD | Admit: 2011-08-12 | Discharge: 2011-08-13 | Disposition: A | Discharge status: Home / Self Care | Payer: Medicaid Other | Source: Ambulatory Visit | Attending: Obstetrics & Gynecology | Admitting: Obstetrics & Gynecology

## 2011-08-12 DIAGNOSIS — R109 Unspecified abdominal pain: Secondary | ICD-10-CM | POA: Insufficient documentation

## 2011-08-12 DIAGNOSIS — N809 Endometriosis, unspecified: Secondary | ICD-10-CM | POA: Insufficient documentation

## 2011-08-12 DIAGNOSIS — G8929 Other chronic pain: Secondary | ICD-10-CM

## 2011-08-12 DIAGNOSIS — N949 Unspecified condition associated with female genital organs and menstrual cycle: Secondary | ICD-10-CM | POA: Insufficient documentation
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
## 2011-08-13 ENCOUNTER — Encounter (HOSPITAL_COMMUNITY)
Admission: RE | Admit: 2011-08-13 | Discharge: 2011-08-13 | Disposition: A | Discharge status: Home / Self Care | Payer: Medicaid Other | Source: Ambulatory Visit | Attending: Obstetrics and Gynecology | Admitting: Obstetrics and Gynecology

## 2011-08-13 ENCOUNTER — Encounter (HOSPITAL_COMMUNITY): Payer: Self-pay | Admitting: *Deleted

## 2011-08-13 ENCOUNTER — Encounter (HOSPITAL_COMMUNITY): Payer: Self-pay

## 2011-08-13 DIAGNOSIS — G8929 Other chronic pain: Secondary | ICD-10-CM

## 2011-08-13 HISTORY — DX: Anxiety disorder, unspecified: F41.9

## 2011-08-13 HISTORY — DX: Endometriosis, unspecified: N80.9

## 2011-08-13 HISTORY — DX: Gastro-esophageal reflux disease without esophagitis: K21.9

## 2011-08-13 LAB — CBC
HCT: 39.3 % (ref 36.0–46.0)
Hemoglobin: 13.4 g/dL (ref 12.0–15.0)
MCHC: 34.1 g/dL (ref 30.0–36.0)
MCV: 91.2 fL (ref 78.0–100.0)
RDW: 12.7 % (ref 11.5–15.5)

## 2011-08-13 LAB — SURGICAL PCR SCREEN: Staphylococcus aureus: NEGATIVE

## 2011-08-13 LAB — BASIC METABOLIC PANEL
BUN: 12 mg/dL (ref 6–23)
CO2: 27 mEq/L (ref 19–32)
Chloride: 101 mEq/L (ref 96–112)
Creatinine, Ser: 0.56 mg/dL (ref 0.50–1.10)
GFR calc Af Amer: >90 mL/min (ref >90)
Glucose, Bld: 126 mg/dL — ABNORMAL HIGH (ref 70–99)
Potassium: 3.5 mEq/L (ref 3.5–5.1)

## 2011-08-13 LAB — PROTIME-INR: INR: 0.93 (ref 0.00–1.49)

## 2011-08-13 MED ORDER — MORPHINE SULFATE ER 100 MG PO TBCR: 100.0000 mg | EXTENDED_RELEASE_TABLET | Freq: Two times a day (BID) | ORAL | Status: DC

## 2011-08-13 MED ORDER — MORPHINE SULFATE ER 30 MG PO TBCR
90.0000 mg | EXTENDED_RELEASE_TABLET | Freq: Once | ORAL | Status: DC
  Filled 2011-08-13: qty 3

## 2011-08-13 MED ORDER — MORPHINE SULFATE ER 30 MG PO TBCR: 90.0000 mg | EXTENDED_RELEASE_TABLET | Freq: Once | ORAL | Status: DC

## 2011-08-13 NOTE — MAU Note (Signed)
Pt states her pain is the same pain she has always had but she came in because she did not have any meds

## 2011-08-13 NOTE — MAU Note (Signed)
Pt aware it will some time before meds arrive. Will need to call someone to pick her up. Pt ok with this
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
## 2011-08-13 NOTE — Patient Instructions (Addendum)
   Your procedure is scheduled on: Tuesday July 30th  Enter through the [employer] of [facility] at: [employer] up the phone at the desk and dial [phone] and inform us of your arrival.  Please call this number if you have any problems the morning of surgery: [phone]  Remember: Do not eat food after midnight: Monday Do not drink clear liquids after: Midnight Monday Take these medicines the morning of surgery with a SIP OF WATER: morning medications  Do not wear jewelry, make-up, or FINGER nail polish No metal in your hair or on your body. Do not wear lotions, powders, perfumes or deodorant. Do not shave 48 hours prior to surgery. Do not bring valuables to the hospital. Contacts, dentures or bridgework may not be worn into surgery.  Leave suitcase in the car. After Surgery it may be brought to your room. For patients being admitted to the hospital, checkout time is 11:00am the day of discharge.     Remember to use your hibiclens as instructed.Please shower with 1/2 bottle the evening before your surgery and the other 1/2 bottle the morning of surgery. Neck down avoiding private area.

## 2011-08-18 NOTE — H&P (Signed)
[name] [name] is an 31 y.o. female with a history of chronic pelvic pain. She was seen in the emergency room on June 24, 2011 for right lower quadrant pain associated with nausea and vomiting. She had previously undergone an appendectomy. Evaluation including a CT scan was essentially negative and did not reveal anything to explain her pain. She was placed on antibiotic treatment but this also did not lead to any improvement of her symptoms. She also reports progressive dysmenorrhea and dyspareunia and was told in the past that she had endometriosis. At this point she wants definitive treatment for her pelvic pain and is being brought to the operating room for an LAVH possible BSO  or an abdominal hysterectomy based on the findings when th laparoscopy is performed. She understands that she may loose both her ovaries if the is significant endometriosis or adhesive disease found at the time of surgery.  Pertinent Gynecological History: Menses: regular every month without intermenstrual spotting Contraception: tubal ligation DES exposure: unknown Blood transfusions: none Sexually transmitted diseases: no past history Previous GYN Procedures: Tubal ligation OB History: G6 [id number]     Past Medical History  Diagnosis Date  . Migraine   . GERD (gastroesophageal reflux disease)     protonix evenings  . Endometriosis     chronic pain  . Anxiety     klonipin for stress disorder    Past Surgical History  Procedure Date  . Appendectomy   . Tonsillectomy   . Tubal ligation     Family History  Problem Relation Age of Onset  . Coronary artery disease Father   . Coronary artery disease Maternal Grandmother   . Breast cancer Mother   . Cancer Mother     Social History:  reports that she has been smoking.  She does not have any smokeless tobacco history on file. She reports that she does not drink alcohol or use illicit drugs.  Allergies:  Allergies  Allergen Reactions  . Epidural Tray  17gx3-1-2" (Nerve Block Tray) Other (See Comments)    Paralysis and severe pain in head/neck/shoulders.  No anaphylaxis.  . Ibuprofen Hives  . Penicillins Hives    No prescriptions prior to admission    ROS  Respiratory: no cough, shortness of breath or hemoptasis GI: no nausea, vomiting,constipation or diarrhea GU: no dysuria, frequency, or nocturia. GYN: positive pain with sex and worsening pain with menses. Cycles are regular and can be heavy   There were no vitals taken for this visit. Physical Exam  Afebrile   BP 132/88  Pulse 84  Respirations 16  Head: normocephalic and atraumatic Neck: Supple no thyromegaly, no adenopathy Chest: Clear to P&A Heart: regular rhythm no murmur or gallop Abdomen: soft without enlargement of the liver, kidneys or spleen. There is lower abdominal tenderness but no rebound or guarding. No masses are noted Pelvic:              External Genitalia: within normal limits              BUS: within normal limits             Vagina: without lesion normal discharge is noted              Cervix is without lesion but is tender to touch and motion              Uterus is anterior smooth and regular in shape but is tender to touch and motion and movement duplicates patient's pelvic  pain and pain with sex  Neurologic exam: Grossly intact  No results found for this or any previous visit (from the past 24 hour(s)).  No results found.  Assessment/Plan:   Impresson: Chronic pelvic pain, dysmenorrhea and dyspareunia. History of endometriosis. Prior tubal sterilization  Plan: Laparoscopically assisted vaginal hysterectomy possible abdominal hysterectomy, possible bilateral salpingo oophorectomy.  Risks and benefits have been discussed as well as need for post operative hormone replacement therapy if the ovaries are removed.  Garyn Waguespack 08/18/2011, 6:17 PM

## 2011-08-19 ENCOUNTER — Encounter (HOSPITAL_COMMUNITY): Payer: Self-pay | Admitting: Anesthesiology

## 2011-08-19 ENCOUNTER — Encounter (HOSPITAL_COMMUNITY)
Admission: RE | Disposition: A | Discharge status: Home / Self Care | Payer: Self-pay | Source: Ambulatory Visit | Attending: Obstetrics and Gynecology

## 2011-08-19 ENCOUNTER — Ambulatory Visit (HOSPITAL_COMMUNITY): Payer: Medicaid Other | Admitting: Anesthesiology

## 2011-08-19 ENCOUNTER — Ambulatory Visit (HOSPITAL_COMMUNITY)
Admission: RE | Admit: 2011-08-19 | Discharge: 2011-08-21 | Disposition: A | Discharge status: Home / Self Care | Payer: Medicaid Other | Source: Ambulatory Visit | Attending: Obstetrics and Gynecology | Admitting: Obstetrics and Gynecology

## 2011-08-19 DIAGNOSIS — N946 Dysmenorrhea, unspecified: Secondary | ICD-10-CM | POA: Insufficient documentation

## 2011-08-19 DIAGNOSIS — IMO0002 Reserved for concepts with insufficient information to code with codable children: Secondary | ICD-10-CM | POA: Insufficient documentation

## 2011-08-19 DIAGNOSIS — R109 Unspecified abdominal pain: Secondary | ICD-10-CM

## 2011-08-19 DIAGNOSIS — N9489 Other specified conditions associated with female genital organs and menstrual cycle: Secondary | ICD-10-CM

## 2011-08-19 HISTORY — PX: LAPAROSCOPIC ASSISTED VAGINAL HYSTERECTOMY: SHX5398

## 2011-08-19 SURGERY — HYSTERECTOMY, VAGINAL, LAPAROSCOPY-ASSISTED
Anesthesia: General | Site: Abdomen | Wound class: Clean Contaminated

## 2011-08-19 MED ORDER — HYDROMORPHONE 0.3 MG/ML IV SOLN
INTRAVENOUS | Status: DC
  Administered 2011-08-19: 15 mg via INTRAVENOUS
  Administered 2011-08-19: 21:00:00 via INTRAVENOUS
  Administered 2011-08-19: 9.23 mg via INTRAVENOUS
  Administered 2011-08-19: 0.5 mg via INTRAVENOUS
  Administered 2011-08-19: 0.3 mg via INTRAVENOUS
  Administered 2011-08-19: 16 mg via INTRAVENOUS
  Administered 2011-08-20: 9.79 mg via INTRAVENOUS
  Administered 2011-08-20: 3.9 mg via INTRAVENOUS
  Administered 2011-08-20: 15 mL via INTRAVENOUS
  Administered 2011-08-20: 12 mL via INTRAVENOUS
  Administered 2011-08-20: 04:00:00 via INTRAVENOUS
  Administered 2011-08-20: 5.4 mg via INTRAVENOUS
  Administered 2011-08-20: 20 mL via INTRAVENOUS
  Administered 2011-08-20 (×2): via INTRAVENOUS
  Administered 2011-08-21: 3.26 mg via INTRAVENOUS
  Administered 2011-08-21: 3.9 mg via INTRAVENOUS
  Administered 2011-08-21: 07:00:00 via INTRAVENOUS
  Filled 2011-08-19 (×8): qty 25

## 2011-08-19 MED ORDER — HYDROMORPHONE HCL PF 1 MG/ML IJ SOLN
INTRAMUSCULAR | Status: AC
  Administered 2011-08-19: 1 mg
  Filled 2011-08-19: qty 1

## 2011-08-19 MED ORDER — NEOSTIGMINE METHYLSULFATE 1 MG/ML IJ SOLN
INTRAMUSCULAR | Status: DC | PRN
  Administered 2011-08-19: 3.5 mg via INTRAVENOUS

## 2011-08-19 MED ORDER — BUPIVACAINE HCL (PF) 0.25 % IJ SOLN
INTRAMUSCULAR | Status: DC | PRN
  Administered 2011-08-19: 10 mL

## 2011-08-19 MED ORDER — KETOROLAC TROMETHAMINE 30 MG/ML IJ SOLN
30.0000 mg | Freq: Four times a day (QID) | INTRAMUSCULAR | Status: AC
  Administered 2011-08-19 – 2011-08-20 (×6): 30 mg via INTRAVENOUS
  Filled 2011-08-19 (×6): qty 1

## 2011-08-19 MED ORDER — LACTATED RINGERS IV SOLN
INTRAVENOUS | Status: DC
  Administered 2011-08-19 (×2): via INTRAVENOUS

## 2011-08-19 MED ORDER — MIDAZOLAM HCL 5 MG/5ML IJ SOLN
INTRAMUSCULAR | Status: DC | PRN
  Administered 2011-08-19 (×2): 2 mg via INTRAVENOUS

## 2011-08-19 MED ORDER — PROPOFOL 10 MG/ML IV EMUL
INTRAVENOUS | Status: DC | PRN
  Administered 2011-08-19: 200 mg via INTRAVENOUS

## 2011-08-19 MED ORDER — ZOLPIDEM TARTRATE 5 MG PO TABS
10.0000 mg | ORAL_TABLET | Freq: Every evening | ORAL | Status: DC | PRN
  Administered 2011-08-19 – 2011-08-20 (×2): 10 mg via ORAL
  Filled 2011-08-19 (×2): qty 2

## 2011-08-19 MED ORDER — DIPHENHYDRAMINE HCL 50 MG/ML IJ SOLN
12.5000 mg | Freq: Four times a day (QID) | INTRAMUSCULAR | Status: DC | PRN
  Administered 2011-08-19: 12.5 mg via INTRAVENOUS
  Filled 2011-08-19: qty 1

## 2011-08-19 MED ORDER — PROMETHAZINE HCL 25 MG/ML IJ SOLN
12.5000 mg | INTRAMUSCULAR | Status: DC | PRN
  Administered 2011-08-19 – 2011-08-20 (×2): 12.5 mg via INTRAVENOUS
  Filled 2011-08-19 (×2): qty 1

## 2011-08-19 MED ORDER — LIDOCAINE HCL (CARDIAC) 20 MG/ML IV SOLN
INTRAVENOUS | Status: DC | PRN
  Administered 2011-08-19: 80 mg via INTRAVENOUS

## 2011-08-19 MED ORDER — DEXAMETHASONE SODIUM PHOSPHATE 10 MG/ML IJ SOLN
INTRAMUSCULAR | Status: DC | PRN
  Administered 2011-08-19: 10 mg via INTRAVENOUS

## 2011-08-19 MED ORDER — HYDROMORPHONE HCL PF 1 MG/ML IJ SOLN
INTRAMUSCULAR | Status: AC
  Filled 2011-08-19: qty 1

## 2011-08-19 MED ORDER — MENTHOL 3 MG MT LOZG
1.0000 | LOZENGE | OROMUCOSAL | Status: DC | PRN
Start: 2011-08-19 — End: 2011-08-21
  Filled 2011-08-19: qty 9

## 2011-08-19 MED ORDER — GLYCOPYRROLATE 0.2 MG/ML IJ SOLN
INTRAMUSCULAR | Status: DC | PRN
  Administered 2011-08-19: 0.1 mg via INTRAVENOUS
  Administered 2011-08-19: .7 mg via INTRAVENOUS

## 2011-08-19 MED ORDER — PANTOPRAZOLE SODIUM 40 MG PO TBEC
40.0000 mg | DELAYED_RELEASE_TABLET | Freq: Every day | ORAL | Status: DC
  Administered 2011-08-20: 40 mg via ORAL
  Filled 2011-08-19 (×3): qty 1

## 2011-08-19 MED ORDER — ONDANSETRON HCL 4 MG/2ML IJ SOLN
INTRAMUSCULAR | Status: DC | PRN
  Administered 2011-08-19: 4 mg via INTRAVENOUS

## 2011-08-19 MED ORDER — OXYCODONE-ACETAMINOPHEN 5-325 MG PO TABS
1.0000 | ORAL_TABLET | ORAL | Status: DC | PRN
  Administered 2011-08-21: 2 via ORAL
  Filled 2011-08-19: qty 2

## 2011-08-19 MED ORDER — 0.9 % SODIUM CHLORIDE (POUR BTL) OPTIME
TOPICAL | Status: DC | PRN
  Administered 2011-08-19 (×2): 1000 mL

## 2011-08-19 MED ORDER — LIDOCAINE HCL (CARDIAC) 20 MG/ML IV SOLN
INTRAVENOUS | Status: AC
  Filled 2011-08-19: qty 5

## 2011-08-19 MED ORDER — ONDANSETRON HCL 4 MG/2ML IJ SOLN: 4.0000 mg | Freq: Four times a day (QID) | INTRAMUSCULAR | Status: DC | PRN

## 2011-08-19 MED ORDER — LACTATED RINGERS IR SOLN
Status: DC | PRN
  Administered 2011-08-19: 3000 mL

## 2011-08-19 MED ORDER — FENTANYL CITRATE 0.05 MG/ML IJ SOLN
INTRAMUSCULAR | Status: AC
  Filled 2011-08-19: qty 2

## 2011-08-19 MED ORDER — NALOXONE HCL 0.4 MG/ML IJ SOLN: 0.4000 mg | INTRAMUSCULAR | Status: DC | PRN

## 2011-08-19 MED ORDER — FENTANYL CITRATE 0.05 MG/ML IJ SOLN
INTRAMUSCULAR | Status: AC
  Filled 2011-08-19: qty 5

## 2011-08-19 MED ORDER — LACTATED RINGERS IV SOLN
INTRAVENOUS | Status: DC
  Administered 2011-08-19 – 2011-08-21 (×5): via INTRAVENOUS

## 2011-08-19 MED ORDER — FENTANYL CITRATE 0.05 MG/ML IJ SOLN
INTRAMUSCULAR | Status: DC | PRN
  Administered 2011-08-19: 100 ug via INTRAVENOUS
  Administered 2011-08-19: 50 ug via INTRAVENOUS
  Administered 2011-08-19: 100 ug via INTRAVENOUS
  Administered 2011-08-19 (×2): 50 ug via INTRAVENOUS

## 2011-08-19 MED ORDER — HYDROMORPHONE HCL PF 1 MG/ML IJ SOLN
INTRAMUSCULAR | Status: AC
  Administered 2011-08-19: 1 mg via INTRAVENOUS
  Filled 2011-08-19: qty 1

## 2011-08-19 MED ORDER — PROPOFOL 10 MG/ML IV EMUL: INTRAVENOUS | Status: DC | PRN

## 2011-08-19 MED ORDER — MIDAZOLAM HCL 2 MG/2ML IJ SOLN
INTRAMUSCULAR | Status: AC
  Filled 2011-08-19: qty 2

## 2011-08-19 MED ORDER — LIDOCAINE-EPINEPHRINE 1 %-1:100000 IJ SOLN
INTRAMUSCULAR | Status: DC | PRN
  Administered 2011-08-19: 7 mL

## 2011-08-19 MED ORDER — CEFAZOLIN SODIUM-DEXTROSE 2-3 GM-% IV SOLR
INTRAVENOUS | Status: AC
  Filled 2011-08-19: qty 50

## 2011-08-19 MED ORDER — CEFAZOLIN SODIUM-DEXTROSE 2-3 GM-% IV SOLR
2.0000 g | INTRAVENOUS | Status: AC
  Administered 2011-08-19: 2 g via INTRAVENOUS

## 2011-08-19 MED ORDER — INDIGOTINDISULFONATE SODIUM 8 MG/ML IJ SOLN
INTRAMUSCULAR | Status: AC
  Filled 2011-08-19: qty 5

## 2011-08-19 MED ORDER — ROCURONIUM BROMIDE 50 MG/5ML IV SOLN
INTRAVENOUS | Status: AC
  Filled 2011-08-19: qty 1

## 2011-08-19 MED ORDER — BUPIVACAINE HCL (PF) 0.25 % IJ SOLN
INTRAMUSCULAR | Status: AC
  Filled 2011-08-19: qty 30

## 2011-08-19 MED ORDER — GLYCOPYRROLATE 0.2 MG/ML IJ SOLN
INTRAMUSCULAR | Status: AC
  Filled 2011-08-19: qty 2

## 2011-08-19 MED ORDER — HYDROMORPHONE HCL PF 1 MG/ML IJ SOLN
0.2500 mg | INTRAMUSCULAR | Status: DC | PRN
  Administered 2011-08-19: 0.5 mg via INTRAVENOUS
  Administered 2011-08-19: 1 mg via INTRAVENOUS
  Administered 2011-08-19: 0.5 mg via INTRAVENOUS
  Administered 2011-08-19: 1 mg via INTRAVENOUS

## 2011-08-19 MED ORDER — NEOSTIGMINE METHYLSULFATE 1 MG/ML IJ SOLN
INTRAMUSCULAR | Status: AC
  Filled 2011-08-19: qty 10

## 2011-08-19 MED ORDER — DEXAMETHASONE SODIUM PHOSPHATE 10 MG/ML IJ SOLN
INTRAMUSCULAR | Status: AC
  Filled 2011-08-19: qty 1

## 2011-08-19 MED ORDER — PROPOFOL 10 MG/ML IV EMUL
INTRAVENOUS | Status: AC
  Filled 2011-08-19: qty 20

## 2011-08-19 MED ORDER — ONDANSETRON HCL 4 MG/2ML IJ SOLN
INTRAMUSCULAR | Status: AC
  Filled 2011-08-19: qty 2

## 2011-08-19 MED ORDER — ROCURONIUM BROMIDE 100 MG/10ML IV SOLN
INTRAVENOUS | Status: DC | PRN
  Administered 2011-08-19: 5 mg via INTRAVENOUS
  Administered 2011-08-19: 40 mg via INTRAVENOUS

## 2011-08-19 MED ORDER — HYDROMORPHONE HCL PF 1 MG/ML IJ SOLN
INTRAMUSCULAR | Status: DC | PRN
  Administered 2011-08-19 (×3): 1 mg via INTRAVENOUS

## 2011-08-19 MED ORDER — DIPHENHYDRAMINE HCL 12.5 MG/5ML PO ELIX
12.5000 mg | ORAL_SOLUTION | Freq: Four times a day (QID) | ORAL | Status: DC | PRN
  Filled 2011-08-19: qty 5

## 2011-08-19 MED ORDER — SODIUM CHLORIDE 0.9 % IJ SOLN: 9.0000 mL | INTRAMUSCULAR | Status: DC | PRN

## 2011-08-19 SURGICAL SUPPLY — 47 items
BLADE SURG 15 STRL LF C SS BP (BLADE) ×3 IMPLANT
BLADE SURG 15 STRL SS (BLADE) ×1
CANISTER SUCTION 2500CC (MISCELLANEOUS) ×4 IMPLANT
CLOTH BEACON ORANGE TIMEOUT ST (SAFETY) ×4 IMPLANT
CONT PATH 16OZ SNAP LID 3702 (MISCELLANEOUS) ×4 IMPLANT
COVER MAYO STAND STRL (DRAPES) ×4 IMPLANT
COVER TABLE BACK 60X90 (DRAPES) ×4 IMPLANT
DECANTER SPIKE VIAL GLASS SM (MISCELLANEOUS) ×8 IMPLANT
DRSG COVADERM PLUS 2X2 (GAUZE/BANDAGES/DRESSINGS) ×4 IMPLANT
ELECT REM PT RETURN 9FT ADLT (ELECTROSURGICAL) ×4
ELECTRODE REM PT RTRN 9FT ADLT (ELECTROSURGICAL) ×3 IMPLANT
EVACUATOR SMOKE 8.L (FILTER) ×4 IMPLANT
FORCEPS CUTTING 33CM 5MM (CUTTING FORCEPS) ×4 IMPLANT
GAUZE PACKING IODOFORM 2 (PACKING) IMPLANT
GAUZE SPONGE 4X4 16PLY XRAY LF (GAUZE/BANDAGES/DRESSINGS) ×4 IMPLANT
GLOVE BIO SURGEON STRL SZ7.5 (GLOVE) ×12 IMPLANT
GLOVE BIOGEL PI IND STRL 6.5 (GLOVE) ×9 IMPLANT
GLOVE BIOGEL PI INDICATOR 6.5 (GLOVE) ×3
GLOVE ECLIPSE 7.5 STRL STRAW (GLOVE) ×8 IMPLANT
GOWN PREVENTION PLUS LG XLONG (DISPOSABLE) ×12 IMPLANT
GOWN PREVENTION PLUS XXLARGE (GOWN DISPOSABLE) ×4 IMPLANT
NEEDLE HYPO 25X1 1.5 SAFETY (NEEDLE) IMPLANT
NS IRRIG 1000ML POUR BTL (IV SOLUTION) ×4 IMPLANT
PACK ABDOMINAL GYN (CUSTOM PROCEDURE TRAY) IMPLANT
PACK LAVH (CUSTOM PROCEDURE TRAY) ×4 IMPLANT
PAD OB MATERNITY 4.3X12.25 (PERSONAL CARE ITEMS) ×4 IMPLANT
PROTECTOR NERVE ULNAR (MISCELLANEOUS) ×4 IMPLANT
SET IRRIG TUBING LAPAROSCOPIC (IRRIGATION / IRRIGATOR) ×4 IMPLANT
SOLUTION ELECTROLUBE (MISCELLANEOUS) ×4 IMPLANT
SPONGE LAP 18X18 X RAY DECT (DISPOSABLE) ×8 IMPLANT
STAPLER VISISTAT 35W (STAPLE) IMPLANT
STRIP CLOSURE SKIN 1/4X3 (GAUZE/BANDAGES/DRESSINGS) IMPLANT
SUT CHROMIC 0 CT 1 (SUTURE) IMPLANT
SUT VIC AB 0 CT1 18XCR BRD8 (SUTURE) ×9 IMPLANT
SUT VIC AB 0 CT1 27 (SUTURE) ×5
SUT VIC AB 0 CT1 27XBRD ANBCTR (SUTURE) ×15 IMPLANT
SUT VIC AB 0 CT1 8-18 (SUTURE) ×3
SUT VIC AB 2-0 SH 27 (SUTURE)
SUT VIC AB 2-0 SH 27XBRD (SUTURE) IMPLANT
SUT VIC AB 3-0 X1 27 (SUTURE) ×4 IMPLANT
SUT VICRYL 0 TIES 12 18 (SUTURE) ×4 IMPLANT
SUT VICRYL 1 TIES 12X18 (SUTURE) ×4 IMPLANT
SYR CONTROL 10ML LL (SYRINGE) IMPLANT
TOWEL OR 17X24 6PK STRL BLUE (TOWEL DISPOSABLE) ×8 IMPLANT
TRAY FOLEY CATH 14FR (SET/KITS/TRAYS/PACK) ×4 IMPLANT
WARMER LAPAROSCOPE (MISCELLANEOUS) ×4 IMPLANT
WATER STERILE IRR 1000ML POUR (IV SOLUTION) IMPLANT

## 2011-08-19 NOTE — Anesthesia Preprocedure Evaluation (Addendum)
Anesthesia Evaluation  Patient identified by MRN, date of birth, ID band Patient awake    Reviewed: Allergy & Precautions, H&P , Patient's Chart, lab work & pertinent test results, reviewed documented beta blocker date and time   Airway Mallampati: II TM Distance: >3 FB Neck ROM: full    Dental No notable dental hx. (+) Dental Advisory Given,    Pulmonary  breath sounds clear to auscultation  Pulmonary exam normal       Cardiovascular Rhythm:regular Rate:Normal     Neuro/Psych    GI/Hepatic GERD-  Medicated and Controlled,  Endo/Other    Renal/GU      Musculoskeletal   Abdominal   Peds  Hematology   Anesthesia Other Findings Full caps; discussed dental injury Chronic Opiate use; discussed PACU difficulty with pain Mgt Epidural space likely non-compliant as "allergy"  Reproductive/Obstetrics                          Anesthesia Physical Anesthesia Plan  ASA: II  Anesthesia Plan: General   Post-op Pain Management:    Induction: Intravenous  Airway Management Planned: Oral ETT  Additional Equipment:   Intra-op Plan:   Post-operative Plan:   Informed Consent: I have reviewed the patients History and Physical, chart, labs and discussed the procedure including the risks, benefits and alternatives for the proposed anesthesia with the patient or authorized representative who has indicated his/her understanding and acceptance.   Dental Advisory Given and Dental advisory given  Plan Discussed with: CRNA and Surgeon  Anesthesia Plan Comments: (  Discussed  general anesthesia, including possible nausea, instrumentation of airway, sore throat,pulmonary aspiration, etc. I asked if the were any outstanding questions, or  concerns before we proceeded. )        Anesthesia Quick Evaluation

## 2011-08-19 NOTE — Transfer of Care (Signed)
Immediate Anesthesia Transfer of Care Note  Patient: [name] [name]  Procedure(s) Performed: Procedure(s) (LRB): LAPAROSCOPIC ASSISTED VAGINAL HYSTERECTOMY (N/A)  Patient Location: PACU  Anesthesia Type: General  Level of Consciousness: awake, alert  and oriented  Airway & Oxygen Therapy: Patient Spontanous Breathing and Patient connected to nasal cannula oxygen  Post-op Assessment: Report given to PACU RN and Post -op Vital signs reviewed and stable  Post vital signs: Reviewed and stable  Complications: No apparent anesthesia complications

## 2011-08-19 NOTE — Brief Op Note (Signed)
08/19/2011  8:57 AM  PATIENT:  [name] [name]  31 y.o. female  PRE-OPERATIVE DIAGNOSIS:  CHRONIC PELVIC PAIN ENDOMETRIOSIS  POST-OPERATIVE DIAGNOSIS:  CHRONIC PELVIC PAIN ENDOMETRIOSIS  PROCEDURE:  Procedure(s) (LRB): LAPAROSCOPIC ASSISTED VAGINAL HYSTERECTOMY (N/A)  SURGEON:  Surgeon(s) and Role:    * Miguel Aschoff, MD - Primary    * W Lodema Hong, MD - Assisting  PHYSICIAN ASSISTANT:      ANESTHESIA:   general  EBL:  Total I/O In: 2000 [I.V.:2000] Out: 250 [Urine:100; Blood:150]  BLOOD ADMINISTERED:none  DRAINS: Urinary Catheter (Foley)   LOCAL MEDICATIONS USED:  MARCAINE     SPECIMEN:  Source of Specimen:  cervix and uterus  DISPOSITION OF SPECIMEN:  PATHOLOGY  COUNTS:  YES  TOURNIQUET:  * No tourniquets in log *  DICTATION: .Other Dictation: Dictation Number [id number]  PLAN OF CARE: Admit for overnight observation  PATIENT DISPOSITION:  PACU - hemodynamically stable.   Delay start of Pharmacological VTE agent (>24hrs) due to surgical blood loss or risk of bleeding: PAS hose placed pre op

## 2011-08-19 NOTE — Anesthesia Procedure Notes (Signed)
Procedure Name: Intubation Date/Time: 08/19/2011 7:29 AM Performed by: Graciela Husbands Pre-anesthesia Checklist: Suction available, Emergency Drugs available, Timeout performed, Patient identified and Patient being monitored Patient Re-evaluated:Patient Re-evaluated prior to inductionOxygen Delivery Method: Circle system utilized Preoxygenation: Pre-oxygenation with 100% oxygen Intubation Type: IV induction and Inhalational induction Ventilation: Oral airway inserted - appropriate to patient size and Mask ventilation without difficulty Laryngoscope Size: Mac and 3 Grade View: Grade I Tube type: Oral Number of attempts: 1 Airway Equipment and Method: Stylet Placement Confirmation: ETT inserted through vocal cords under direct vision,  breath sounds checked- equal and bilateral and positive ETCO2 Secured at: 7 cm Tube secured with: Tape Dental Injury: Teeth and Oropharynx as per pre-operative assessment

## 2011-08-19 NOTE — Progress Notes (Signed)
Pt has been crying with pain 9-10/10 since arrival, dilaudid given and Dr Arby Barrette called after pain med not helping, orders received, will continue to monitor pt states she takes 100 mg of MS every 12 hours at home as well as Oxycodone and clonopin around the clock.  Discussed pain medication administration and difficulty in controlling pain with pt.  Will provide distraction and encourage deep breathing to pt.

## 2011-08-19 NOTE — Anesthesia Postprocedure Evaluation (Signed)
Anesthesia Post Note  Patient: [name] [name]  Procedure(s) Performed: Procedure(s) (LRB): LAPAROSCOPIC ASSISTED VAGINAL HYSTERECTOMY (N/A)  Anesthesia type: General  Patient location: PACU  Post pain: Pain level controlled  Post assessment: Post-op Vital signs reviewed  Last Vitals:  Filed Vitals:   08/19/11 0945  BP: 108/71  Pulse: 99  Temp:   Resp: 14    Post vital signs: Reviewed  Level of consciousness: sedated  Complications: No apparent anesthesia complicationsfj

## 2011-08-20 ENCOUNTER — Encounter (HOSPITAL_COMMUNITY): Payer: Self-pay | Admitting: *Deleted

## 2011-08-20 LAB — CBC
Hemoglobin: 10.8 g/dL — ABNORMAL LOW (ref 12.0–15.0)
MCH: 31.2 pg (ref 26.0–34.0)
MCV: 92.2 fL (ref 78.0–100.0)
RBC: 3.46 MIL/uL — ABNORMAL LOW (ref 3.87–5.11)

## 2011-08-20 MED ORDER — CLONAZEPAM 0.5 MG PO TABS
1.0000 mg | ORAL_TABLET | Freq: Three times a day (TID) | ORAL | Status: DC | PRN
  Administered 2011-08-20 (×2): 1 mg via ORAL
  Filled 2011-08-20 (×2): qty 1
  Filled 2011-08-20: qty 2

## 2011-08-20 MED ORDER — AMITRIPTYLINE HCL 100 MG PO TABS
100.0000 mg | ORAL_TABLET | Freq: Every evening | ORAL | Status: DC | PRN
  Administered 2011-08-20: 100 mg via ORAL
  Filled 2011-08-20: qty 1

## 2011-08-20 MED ORDER — CALCIUM CARBONATE ANTACID 500 MG PO CHEW
200.0000 mg | CHEWABLE_TABLET | Freq: Three times a day (TID) | ORAL | Status: DC
  Administered 2011-08-20 (×2): 200 mg via ORAL
  Filled 2011-08-20 (×2): qty 1

## 2011-08-20 NOTE — Progress Notes (Signed)
08/20/11 1100  Clinical Encounter Type  Visited With Patient  Visit Type Initial;Spiritual support;Social support  Referral From Nurse Delorse Lek)  Spiritual Encounters  Spiritual Needs Emotional  Stress Factors  Patient Stress Factors (Chronic pain; concerned that surgery may not have solved it)  Family Stress Factors Major life changes    Made brief initial visit with Ms Gladhill to offer spiritual and emotional support.  Provided opportunity to process her feelings about her surgery, pain, and other life issues.  Per pt, her MIL is a primary supporter, caring for her three children while she recovers; Ms Meldrum reports that their relationship is generally good, though complicated by MIL's grief from losing her husband 6 months ago, as well as other personal factors.  Ms Rud feels some stress about how much her husband's job requires him to be out of town and that it may cause the family to relocate to [place], [place], where they know no one.  Ms Katayama states that she doesn't feel grief about having her uterus removed because she is done having children; on the contrary, she looks forward to having no menstrual periods and hopes that she will feel some relief from the chronic pain she has been experiencing.  Husband plans to visit this afternoon before he leaves again for work.  Ms Gabbert is aware of ongoing chaplain availability, and we plan to follow up tomorrow afternoon.  [address] [place], [place] [phone]

## 2011-08-20 NOTE — Addendum Note (Signed)
Addendum  created 08/20/11 7425 by Shanon Payor, CRNA   Modules edited:Notes Section

## 2011-08-20 NOTE — Progress Notes (Signed)
S: Still having issues with pain control. Patient had been on oxycontin and morphine prior to surgery and has developed tolerance. Adding phenergan has seemed to help. She has not had any problems as far as allergic reaction to toradol.  O: Temp 98.1    Pulse 82   Respirations 14  BP  94/61  Abdomen is soft wounds are healing well no signs of hematoma or infectiomn  Hg  10.8  WBC  14.5  A: Stable post op but still with pain control issues.  Plan: Will keep on PCA           D/C foley            Add back some of home meds

## 2011-08-20 NOTE — Anesthesia Postprocedure Evaluation (Signed)
  Anesthesia Post-op Note  Patient: [name] [name]  Procedure(s) Performed: Procedure(s) (LRB): LAPAROSCOPIC ASSISTED VAGINAL HYSTERECTOMY (N/A)  Patient Location: [facility]  Anesthesia Type: General  Level of Consciousness: awake, alert  and oriented  Airway and Oxygen Therapy: Patient Spontanous Breathing  Post-op Pain: none  Post-op Assessment: Post-op Vital signs reviewed and Patient's Cardiovascular Status Stable  Post-op Vital Signs: Reviewed and stable  Complications: No apparent anesthesia complications

## 2011-08-20 NOTE — Plan of Care (Signed)
Problem: Phase I Progression Outcomes Goal: Pain controlled with appropriate interventions Outcome: Not Met (add Reason) Pt continues to complain of pain after receiving toradol, PCA dilaudid.

## 2011-08-20 NOTE — Op Note (Signed)
[name] [name], SHUTES NO.:  [id number]  MEDICAL RECORD NO.:  [id number]  LOCATION:  9304                          FACILITY:  [facility]  PHYSICIAN:  Miguel Aschoff, M.D.       DATE OF BIRTH:  [birth year]  DATE OF PROCEDURE:  08/19/2011 DATE OF DISCHARGE:                              OPERATIVE REPORT   PREOPERATIVE DIAGNOSIS:  Chronic pelvic pain, dysmenorrhea, and dyspareunia.  POSTOPERATIVE DIAGNOSIS:  Chronic pelvic pain, dysmenorrhea, and dyspareunia.  PROCEDURE:  Laparoscopically-assisted vaginal hysterectomy.  SURGEON:  Miguel Aschoff, M.D.  ASSISTANT:  Dr. Brendolyn Patty.  ANESTHESIA:  General.  COMPLICATIONS:  None.  JUSTIFICATION:  The patient is a 31 year old white female with a history of persistent pelvic pain, dyspareunia, and worsening dysmenorrhea.  The patient has been evaluated on an outpatient basis including CT scanning, which has not revealed any other etiology for the patient's pelvic pain. She presents now to undergo therapy via a hysterectomy and possible bilateral salpingo-oophorectomy and an effort to relieve the dysmenorrhea and dyspareunia.  Risks and benefits of the procedure were discussed with the patient and informed consent has been obtained.  PROCEDURE:  The patient was taken to the operating room, placed in the supine position.  General anesthesia was administered without difficulty.  She was then placed in the dorsal lithotomy position, prepped and draped in the usual sterile fashion.  A Foley catheter was inserted.  At this point, examination under anesthesia revealed normal external genitalia, normal Bartholin's and Skene's glands, normal urethra.  The vaginal vault was without gross lesion.  The cervix was without gross lesion.  No adnexal masses were felt.  At this point, a Hulka tenaculum was placed through the cervix and held.  Attention was then directed to the umbilicus where a small infraumbilical incision was made.  A  Veress needle was inserted, and then the abdomen was insufflated with 3 L of CO2.  Following this, the trocar to laparoscope was placed followed by laparoscope itself.  Then, under direct visualization, two accessory ports were established in the right and left lower quadrants.  These were 5 mm ports.  Systematic inspection at this point revealed the anterior bladder peritoneum to be unremarkable. The uterus appeared to be unremarkable.  No myomas were noted.  The cul- de-sac was unremarkable.  No distended endometriosis was seen.  The tubes were inspected bilaterally and only noted about the tubes that there was evidence of previous Filshie clips being placed for prior tubal sterilization.  The tubes were otherwise normal along the course, and were fine and delicate.  The areas were inspected.  There was no evidence of endometriomas.  There were no adhesions noted on the ovaries.  The area of the patient's prior appendectomy was also visualized.  There were no adhesions noted in this area.  Inspection of the intestinal surfaces was unremarkable.  Liver surface appeared to be also unremarkable.  At this point, it was elected to proceed with the hysterectomy and since the ovaries were normal, it was felt that the patient be conserved.  A gyrus unit was introduced and the right utero- ovarian ligament was grasped, cauterized, and cut, and the round ligament  was similarly identified, cauterized and cut followed by the fallopian tube.  Then, dissection continued along the broad ligament with the gyrus unit with serial cauterizations and cuts until the level of uterine vessel was reached.  The identical procedure was then carried out on the left side.  Once the level of uterine vessel was reached, attention was directed vaginally.  The patient was placed in high dorsal lithotomy position.  A weighted speculum was placed in the vaginal vault.  The cervix was injected with 10 mL 1% Xylocaine with  epinephrine for hemostasis.  Then, the cervical mucosa was circumscribed, and dissected anteriorly and posteriorly until the peritoneal reflections were found.  Once this was done, the posterior peritoneum was entered. __________ speculum was then placed.  At this point, the uterosacral ligaments were identified, clamped with curved Heaney clamps, cut, and all pedicle suture ligated using suture ligatures of 0 Vicryl.  These pedicles were held.  Then, the cardinal ligaments were clamped, cut, and suture ligated in a similar fashion.  Then, dissection continued along with broad ligament with curved Heaney clamps and suture ligatures of 0 Vicryl.  At this point, the anterior peritoneum was entered without difficulty.  Additional bites were taken with the Heaney clamps, and at this point, again with pedicles being suture ligated with suture ligatures of 0 Vicryl, it was possible to free the specimen.  The specimen consisting of the cervix and uterus.  Inspection was made for hemostasis.  The hemostasis appeared to be excellent.  At this point, posterior vaginal cuff was run using running interlocking 0 Vicryl suture.  Then, the peritoneum was closed using a pursestring suture of 0 Vicryl.  After this was done, the vaginal cuff was closed using running interlocking 0 Vicryl suture, and the uterosacral ligaments were tied at midline to support the apex of vaginal cuff.  At this point, attention was directed back to the abdomen.  The patient was then placed in a supine position.  The abdomen was re-insufflated and inspection at this point revealed excellent hemostasis in the pelvis.  The pelvis irrigated with saline using a Nezhat suction irrigator and with good hemostasis noted with no other problems elected to complete the procedure.  The CO2 was allowed to escape.  All instruments were removed.  The small incisions were closed using subcuticular 4-0 Vicryl.  The patient was reversed from the  anesthetic and brought to recovery room satisfactory condition.  Estimated blood loss was approximately 150 mL.  The patient tolerated the procedure well.     Miguel Aschoff, M.D.     AR/MEDQ  D:  08/19/2011  T:  08/20/2011  Job:  [id number]

## 2011-08-21 NOTE — Progress Notes (Signed)
S: Doing better but still with moderate pain and right lower quadrant pain  O: Afebrile BP 114/80   Pulse 101  Abdomen is soft wounds healing well no rebound or guarding  Pathology benign  A: Stable post op  Plan:  Discharge home            Return to office in 4 weeks            Meds  Tylox one every 3 to 4 hours as needed for pain                        Toradol  10 mgs one every 4 to 6 hours as needed for pain                        Resume prior meds except for prior narcotic medications            Nothing per vagina               Regular diet            Condition improved

## 2011-08-25 ENCOUNTER — Other Ambulatory Visit: Payer: Self-pay | Admitting: Family Medicine

## 2011-08-25 DIAGNOSIS — R52 Pain, unspecified: Secondary | ICD-10-CM

## 2011-08-26 NOTE — Discharge Summary (Signed)
NAMECICELY, [name] NO.:  [id number]  MEDICAL RECORD NO.:  [id number]  LOCATION:  9304                          FACILITY:  [facility]  PHYSICIAN:  Miguel Aschoff, M.D.       DATE OF BIRTH:  26-Mar-1980  DATE OF ADMISSION:  08/19/2011 DATE OF DISCHARGE:  08/22/2011                              DISCHARGE SUMMARY   PREOPERATIVE DIAGNOSIS:  Chronic pelvic pain, dysmenorrhea, dyspareunia.  BRIEF HISTORY:  The patient is a 31 year old, white female, who has had a long history of pelvic pain, unresponsive to medical therapy.  The patient has had multiple emergency room visits to assess this pain and was on narcotic analgesics in effort to control it.  The pain was confined to the lower abdomen, right more than left, associated with dysmenorrhea and dyspareunia.  The patient's past history was significant and that she did have a prior tubal sterilization and was told that she had endometriosis.  Outpatient evaluation did not reveal any abnormal findings on radiologic studies.  The only findings were significant pelvic tenderness on repeated exams.  At this point, the patient requested that a procedure be carried out in effort to control her pelvic pain, and informed as to the options.  The options discussed were laparoscopically assisted vaginal hysterectomy with possible bilateral salpingo-oophorectomy and possible abdominal hysterectomy. Given the alternatives and the nature of the patient's pain, she requested that this procedure be carried out.  She did want her ovaries conserved if they were normal.  HOSPITAL COURSE:  Preoperative studies were obtained.  The chemistry profile was within normal limits except for a slight elevation of the sugar of 126.  PT and PTT were within normal limits.  Preoperative hemoglobin was 13.  HOSPITAL COURSE:  On August 19, 2011, a laparoscopically assisted vaginal hysterectomy was carried out.  At the time of surgery, there were really no  significant adhesions noted on either ovary.  No adhesions were noted near the patient's prior appendectomy, no adhesions were noted in the right abdominal colic gutter nor in the area of the liver.  There was no endometriosis seen on the uterosacral ligaments around the ovaries.  The unremarkable findings with Hulka clips noted on the tubes with the patient's prior tubal sterilization.  Since the ovaries were normal, the only procedure carried out was laparoscopically assisted hysterectomy which was done without difficulty.  The patient's postoperative hemoglobin did remain stable, although it did drop to 10.8.  The only problem with patient's postoperative course was pain control.  This seemed to be difficult because of the large doses of narcotic medications that patient was using prior to her admission.  It was possible to control her pain with PCA Dilaudid and Toradol and by the second postoperative day, she was felt to be in satisfactory condition and will be discharged home.  Medications for home included Tylox 1 every 3 hours as needed for pain, Toradol 10 mg to be taken every 4-6 hours as needed for pain.  She was instructed to resume all her prior medications except for her narcotic medications which had been previously prescribed.  The patient will be seen back in 4 weeks for followup examination.  She is to call for any problems such as fever, pain, or heavy bleeding.  The pathology report on the hysterectomy specimen revealed 94 g uterus with benign proliferative endometrium.  There was a benign cervical mucosa.  No epithelial abnormalities were noted.     Miguel Aschoff, M.D.     AR/MEDQ  D:  08/25/2011  T:  08/26/2011  Job:  [id number]

## 2011-08-29 ENCOUNTER — Ambulatory Visit
Admission: RE | Admit: 2011-08-29 | Discharge: 2011-08-29 | Disposition: A | Discharge status: Home / Self Care | Payer: Medicaid Other | Source: Ambulatory Visit | Attending: Family Medicine | Admitting: Family Medicine

## 2011-08-29 DIAGNOSIS — R52 Pain, unspecified: Secondary | ICD-10-CM

## 2011-09-05 ENCOUNTER — Emergency Department (HOSPITAL_COMMUNITY)
Admission: EM | Admit: 2011-09-05 | Discharge: 2011-09-05 | Disposition: A | Discharge status: Home / Self Care | Payer: Medicaid Other | Attending: Emergency Medicine | Admitting: Emergency Medicine

## 2011-09-05 ENCOUNTER — Encounter (HOSPITAL_COMMUNITY): Payer: Self-pay | Admitting: Family Medicine

## 2011-09-05 DIAGNOSIS — F17200 Nicotine dependence, unspecified, uncomplicated: Secondary | ICD-10-CM | POA: Insufficient documentation

## 2011-09-05 DIAGNOSIS — Z880 Allergy status to penicillin: Secondary | ICD-10-CM | POA: Insufficient documentation

## 2011-09-05 DIAGNOSIS — Z8249 Family history of ischemic heart disease and other diseases of the circulatory system: Secondary | ICD-10-CM | POA: Insufficient documentation

## 2011-09-05 DIAGNOSIS — F411 Generalized anxiety disorder: Secondary | ICD-10-CM | POA: Insufficient documentation

## 2011-09-05 DIAGNOSIS — Z888 Allergy status to other drugs, medicaments and biological substances status: Secondary | ICD-10-CM | POA: Insufficient documentation

## 2011-09-05 DIAGNOSIS — G8929 Other chronic pain: Secondary | ICD-10-CM

## 2011-09-05 DIAGNOSIS — M549 Dorsalgia, unspecified: Secondary | ICD-10-CM | POA: Insufficient documentation

## 2011-09-05 DIAGNOSIS — K219 Gastro-esophageal reflux disease without esophagitis: Secondary | ICD-10-CM | POA: Insufficient documentation

## 2011-09-05 DIAGNOSIS — Z809 Family history of malignant neoplasm, unspecified: Secondary | ICD-10-CM | POA: Insufficient documentation

## 2011-09-05 DIAGNOSIS — N809 Endometriosis, unspecified: Secondary | ICD-10-CM | POA: Insufficient documentation

## 2011-09-05 DIAGNOSIS — Z803 Family history of malignant neoplasm of breast: Secondary | ICD-10-CM | POA: Insufficient documentation

## 2011-09-05 MED ORDER — HYDROMORPHONE HCL PF 2 MG/ML IJ SOLN
2.0000 mg | Freq: Once | INTRAMUSCULAR | Status: AC
  Administered 2011-09-05: 2 mg via INTRAVENOUS
  Filled 2011-09-05: qty 1

## 2011-09-05 MED ORDER — ONDANSETRON 4 MG PO TBDP
4.0000 mg | ORAL_TABLET | Freq: Once | ORAL | Status: AC
  Administered 2011-09-05: 4 mg via ORAL
  Filled 2011-09-05: qty 1

## 2011-09-05 NOTE — ED Notes (Signed)
Pt reports having severe back pain, chronic issue.  States she has not been able to get her pain medications since yesterday.

## 2011-09-05 NOTE — ED Provider Notes (Signed)
History     CSN: [id number]  Arrival date & time 09/05/11  1737   First MD Initiated Contact with Patient 09/05/11 2018      Chief Complaint  Patient presents with  . Back Pain    (Consider location/radiation/quality/duration/timing/severity/associated sxs/prior treatment) HPI  Patient presents to the emergency department with complaints of chronic back pain. She states that she left her medications and her car and her car was repossessed now she has no medicines. She states that she has no one that can watch her case and no weight to [facility] to get it but that is for now. She also she has no money to get her car back. She is requesting refills for the next 2 weeks until her medication can be refill. She denies her pain being worse than normal she says that she last had her medications yesterday that she is in a lot of pain. Admits to having a hysterectomy done 2 weeks ago to try and relieve her back pain which is caused by endometriosis. She states that she is on or fever 100 mg twice a day and oxycodone 15 mg tablets every 4 hours. She states that the back surgery did not help her pain at all.  Past Medical History  Diagnosis Date  . Migraine   . GERD (gastroesophageal reflux disease)     protonix evenings  . Endometriosis     chronic pain  . Anxiety     klonipin for stress disorder    Past Surgical History  Procedure Date  . Appendectomy   . Tonsillectomy   . Tubal ligation   . Laparoscopic assisted vaginal hysterectomy 08/19/2011    Procedure: LAPAROSCOPIC ASSISTED VAGINAL HYSTERECTOMY;  Surgeon: Miguel Aschoff, MD;  Location: [facility];  Service: Gynecology;  Laterality: N/A;  . Abdominal hysterectomy     Family History  Problem Relation Age of Onset  . Coronary artery disease Father   . Coronary artery disease Maternal Grandmother   . Breast cancer Mother   . Cancer Mother     History  Substance Use Topics  . Smoking status: Current Everyday Smoker -- 1.0 packs/day for  14 years  . Smokeless tobacco: Not on file  . Alcohol Use: No    OB History    Grav Para Term Preterm Abortions TAB SAB Ect Mult Living                  Review of Systems   HEENT: denies blurry vision or change in hearing PULMONARY: Denies difficulty breathing and SOB CARDIAC: denies chest pain or heart palpitations MUSCULOSKELETAL:  denies being unable to ambulate ABDOMEN AL: denies abdominal pain GU: denies loss of bowel or urinary control NEURO: denies numbness and tingling in extremities SKIN: no new rashes PSYCH: patient denies anxiety or depression. NECK: Pt denies having neck pain     Allergies  Epidural tray 17gx3-1-2"; Ibuprofen; Penicillins; and Zofran  Home Medications   Current Outpatient Rx  Name Route Sig Dispense Refill  . ACETAMINOPHEN 500 MG PO TABS Oral Take 2,000 mg by mouth once. pain    . AMITRIPTYLINE HCL 100 MG PO TABS Oral Take 100 mg by mouth at bedtime.    Marland Kitchen CLONAZEPAM 1 MG PO TABS Oral Take 0.5 mg by mouth 2 (two) times daily. For anxiety    . LISDEXAMFETAMINE DIMESYLATE 70 MG PO CAPS Oral Take 70 mg by mouth every morning.    Marland Kitchen MORPHINE SULFATE ER 100 MG PO TBCR Oral Take 100  mg by mouth 2 (two) times daily.    . OXYCODONE HCL 15 MG PO TABS Oral Take 15 mg by mouth every 4 (four) hours as needed. pain    . PANTOPRAZOLE SODIUM 40 MG PO TBEC Oral Take 1 tablet (40 mg total) by mouth daily at 12 noon. 30 tablet 0  . ZOLPIDEM TARTRATE 10 MG PO TABS Oral Take 10 mg by mouth at bedtime as needed. For sleep      BP 119/83  Pulse 118  Temp 99.6 F (37.6 C) (Oral)  Resp 20  SpO2 100%  LMP 08/10/2011  Physical Exam  Nursing note and vitals reviewed. Constitutional: She appears well-developed and well-nourished. No distress.  HENT:  Head: Normocephalic and atraumatic.  Eyes: Pupils are equal, round, and reactive to light.  Neck: Normal range of motion. Neck supple.  Cardiovascular: Normal rate and regular rhythm.   Pulmonary/Chest: Effort  normal.  Abdominal: Soft.  Musculoskeletal:        Equal strength to bilateral lower extremities. Neurosensory  function adequate to both legs. Skin color is normal. Skin is warm and moist. I see no step off deformity, no bony tenderness. Pt is able to ambulate without limp. Pain is relieved when sitting in certain positions. ROM is decreased due to pain. No crepitus, laceration, effusion, swelling.  Pulses are normal   Neurological: She is alert.  Skin: Skin is warm and dry.    ED Course  Procedures (including critical care time)  Labs Reviewed - No data to display No results found.   1. Chronic back pain       MDM   Patient asked me to refill her morphine 100 mg tablets and her oxycodone 15 mg tablets for the next 2 weeks. I declined and said that she is getting medication from somebody else and I will not be refilling any of her medication here today. The patient states lining to calm every day then for my back pain to get pain medicine. Anesthetic here and really bad pain he can come to the ER everyday he needs it. Patient given pain medicine in the ER and discharged with no pain medicine.   Patient with back pain. No neurological deficits. Patient is ambulatory. No warning symptoms of back pain including: loss of bowel or bladder control, night sweats, waking from sleep with back pain, unexplained fevers or weight loss, h/o cancer, IVDU, recent trauma. No concern for cauda equina, epidural abscess, or other serious cause of back pain. Conservative measures such as rest, ice/heat and pain medicine indicated with PCP follow-up if no improvement with conservative management.          Dorthula Matas, PA 09/05/11 2041

## 2011-09-05 NOTE — ED Provider Notes (Signed)
Medical screening examination/treatment/procedure(s) were performed by non-physician practitioner and as supervising physician I was immediately available for consultation/collaboration.    Nelia Shi, MD 09/05/11 [phone]

## 2011-09-07 ENCOUNTER — Encounter (HOSPITAL_COMMUNITY): Payer: Self-pay | Admitting: Obstetrics and Gynecology

## 2011-09-07 ENCOUNTER — Inpatient Hospital Stay (HOSPITAL_COMMUNITY)
Admission: AD | Admit: 2011-09-07 | Discharge: 2011-09-07 | Disposition: A | Discharge status: Home / Self Care | Payer: Medicaid Other | Source: Ambulatory Visit | Attending: Obstetrics and Gynecology | Admitting: Obstetrics and Gynecology

## 2011-09-07 DIAGNOSIS — G8929 Other chronic pain: Secondary | ICD-10-CM

## 2011-09-07 DIAGNOSIS — R109 Unspecified abdominal pain: Secondary | ICD-10-CM | POA: Insufficient documentation

## 2011-09-07 DIAGNOSIS — Z90710 Acquired absence of both cervix and uterus: Secondary | ICD-10-CM | POA: Insufficient documentation

## 2011-09-07 DIAGNOSIS — G8918 Other acute postprocedural pain: Secondary | ICD-10-CM

## 2011-09-07 DIAGNOSIS — M549 Dorsalgia, unspecified: Secondary | ICD-10-CM | POA: Insufficient documentation

## 2011-09-07 MED ORDER — KETOROLAC TROMETHAMINE 30 MG/ML IJ SOLN
30.0000 mg | Freq: Once | INTRAMUSCULAR | Status: AC
  Administered 2011-09-07: 30 mg via INTRAMUSCULAR

## 2011-09-07 MED ORDER — KETOROLAC TROMETHAMINE 30 MG/ML IJ SOLN
30.0000 mg | Freq: Once | INTRAMUSCULAR | Status: DC
  Filled 2011-09-07: qty 1

## 2011-09-07 MED ORDER — KETOROLAC TROMETHAMINE 10 MG PO TABS: 10.0000 mg | ORAL_TABLET | Freq: Four times a day (QID) | ORAL | Status: DC | PRN

## 2011-09-07 NOTE — MAU Note (Signed)
Pt presents to MAU with chief complaint of abdominal pain and back pain. Pt is status post hysterectomy 2 weeks ago. 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 Pt says she has been on pain medication for months and has been storing her pain medication in her car due to teenage children. Pt says her car was repossessed last Friday and all of her medication was in the car and now does not have anything to take for pain. Pt went to [facility] yesterday and received treatment and was told to follow up here due to her surgery.

## 2011-09-07 NOTE — MAU Provider Note (Signed)
History     CSN: [id number]  Arrival date & time 09/07/11  7253   None     Chief Complaint  Patient presents with  . Abdominal Pain  . Abdominal Cramping  . Back Pain    (Consider location/radiation/quality/duration/timing/severity/associated sxs/prior treatment) HPIBrandy [name] is a 31 y.o. G6P2 s/p LAVH 7/30 for pelvic pain and dyspareunia. She lost her Rx pain meds when her car was repossesed 2 days ago, needs refills. She went to [facility] ED yesterday with the same request, it was denied. She takes morphine and codeine for chronic back and abd pain, the hyst was to try to relieve this pain, no endometrosis or adhesions were seen. Today she c/o back and abd pain, no N&V,UTI S&S, fever/chills, nl BM yesterday, no vaginal bleeding.  Past Medical History  Diagnosis Date  . Migraine   . GERD (gastroesophageal reflux disease)     protonix evenings  . Endometriosis     chronic pain  . Anxiety     klonipin for stress disorder    Past Surgical History  Procedure Date  . Appendectomy   . Tonsillectomy   . Tubal ligation   . Laparoscopic assisted vaginal hysterectomy 08/19/2011    Procedure: LAPAROSCOPIC ASSISTED VAGINAL HYSTERECTOMY;  Surgeon: Miguel Aschoff, MD;  Location: [facility];  Service: Gynecology;  Laterality: N/A;  . Abdominal hysterectomy     Family History  Problem Relation Age of Onset  . Coronary artery disease Father   . Coronary artery disease Maternal Grandmother   . Breast cancer Mother   . Cancer Mother     History  Substance Use Topics  . Smoking status: Current Everyday Smoker -- 1.0 packs/day for 14 years  . Smokeless tobacco: Not on file  . Alcohol Use: No    OB History    Grav Para Term Preterm Abortions TAB SAB Ect Mult Living                  Review of Systems  Constitutional: Negative for fever and chills.  Gastrointestinal: Negative for abdominal distention.       Abd pain  Genitourinary: Negative for dysuria, urgency, frequency, vaginal  bleeding and vaginal discharge.  Musculoskeletal: Positive for back pain. Negative for gait problem.    Allergies  Epidural tray 17gx3-1-2"; Ibuprofen; Penicillins; and Zofran  Home Medications  No current outpatient prescriptions on file.  BP 111/69  Pulse 116  Resp 20  LMP 08/10/2011  Physical Exam  Constitutional: She is oriented to person, place, and time. She appears well-developed and well-nourished.  Abdominal: She exhibits no distension and no mass. There is no tenderness. There is no rebound and no guarding.  Musculoskeletal: Normal range of motion.  Neurological: She is alert and oriented to person, place, and time.  Skin: Skin is warm and dry.  Psychiatric: She has a normal mood and affect. Her behavior is normal.    ED Course  Procedures (including critical care time)  Labs Reviewed - No data to display No results found. Consulted with Dr Freda Jackson, may give Toradol 30 mg IM here, Rx for # 6 to take home.  No diagnosis found.    MDM

## 2011-09-08 ENCOUNTER — Emergency Department (HOSPITAL_COMMUNITY): Payer: Medicaid Other

## 2011-09-08 ENCOUNTER — Encounter (HOSPITAL_COMMUNITY): Payer: Self-pay | Admitting: *Deleted

## 2011-09-08 ENCOUNTER — Emergency Department (HOSPITAL_COMMUNITY)
Admission: EM | Admit: 2011-09-08 | Discharge: 2011-09-08 | Disposition: A | Discharge status: Home / Self Care | Payer: Medicaid Other | Attending: Emergency Medicine | Admitting: Emergency Medicine

## 2011-09-08 DIAGNOSIS — M545 Low back pain, unspecified: Secondary | ICD-10-CM | POA: Insufficient documentation

## 2011-09-08 DIAGNOSIS — W19XXXA Unspecified fall, initial encounter: Secondary | ICD-10-CM | POA: Insufficient documentation

## 2011-09-08 DIAGNOSIS — Z90710 Acquired absence of both cervix and uterus: Secondary | ICD-10-CM | POA: Insufficient documentation

## 2011-09-08 DIAGNOSIS — M549 Dorsalgia, unspecified: Secondary | ICD-10-CM

## 2011-09-08 DIAGNOSIS — Z880 Allergy status to penicillin: Secondary | ICD-10-CM | POA: Insufficient documentation

## 2011-09-08 DIAGNOSIS — F411 Generalized anxiety disorder: Secondary | ICD-10-CM | POA: Insufficient documentation

## 2011-09-08 DIAGNOSIS — K219 Gastro-esophageal reflux disease without esophagitis: Secondary | ICD-10-CM | POA: Insufficient documentation

## 2011-09-08 DIAGNOSIS — F17200 Nicotine dependence, unspecified, uncomplicated: Secondary | ICD-10-CM | POA: Insufficient documentation

## 2011-09-08 DIAGNOSIS — G8929 Other chronic pain: Secondary | ICD-10-CM | POA: Insufficient documentation

## 2011-09-08 MED ORDER — TRAMADOL HCL 50 MG PO TABS: 50.0000 mg | ORAL_TABLET | Freq: Once | ORAL | Status: DC

## 2011-09-08 MED ORDER — KETOROLAC TROMETHAMINE 60 MG/2ML IM SOLN
60.0000 mg | Freq: Once | INTRAMUSCULAR | Status: AC
  Administered 2011-09-08: 60 mg via INTRAMUSCULAR
  Filled 2011-09-08: qty 2

## 2011-09-08 MED ORDER — OXYCODONE-ACETAMINOPHEN 5-325 MG PO TABS
2.0000 | ORAL_TABLET | Freq: Once | ORAL | Status: AC
  Administered 2011-09-08: 2 via ORAL
  Filled 2011-09-08: qty 2

## 2011-09-08 NOTE — ED Notes (Addendum)
Reports has low back pain from bulging disc L5 & DDD. Had MRI done last week. Now c/o after fall today, worsening pain.  Also reports had car repossessed last Friday which had all her pain meds in it. Seen at [facility] ED last Friday for back pain.  Has f/u appt with Dr. Regino Schultze for 09-18-11 regarding back pain

## 2011-09-08 NOTE — ED Notes (Signed)
PT fell around 1100 and pt fell down 7 steps.  Pt fell down on her back and hit head.  No LOC.  Pt sts already has a bulging disk and deterioration in spine..  Pt is hurting in lower back and ambulatory.

## 2011-09-08 NOTE — ED Provider Notes (Signed)
History  This chart was scribed for Glynn Octave, MD by Shari Heritage. The patient was seen in room TR11C/TR11C. Patient's care was started at 1300.     CSN: [id number]  Arrival date & time 09/08/11  1300   First MD Initiated Contact with Patient 09/08/11 1413      Chief Complaint  Patient presents with  . Fall  . Back Pain    The history is provided by the patient. No language interpreter was used.   Shallon Yaklin is a 31 y.o. female who presents to the Emergency Department complaining of a fall that occurred 3 hours ago. Patient has moderate to severe, constant back pain and a mild to moderate HA with associated nausea. No urinary or bowel incontinence. No fevers. No vomiting.  Patient says that her back pain was present prior to the fall. She has been diagnosed with a bulging disc and spine deterioration. Patient also has residual abdominal pain from a hysterectomy on 7/30/21013. Patient is ambulatory. Her medical history includes GERD and endometriosis. Other surgical procedures include appendectomy, tonsillectomy and tubal ligation.  Past Medical History  Diagnosis Date  . Migraine   . GERD (gastroesophageal reflux disease)     protonix evenings  . Endometriosis     chronic pain  . Anxiety     klonipin for stress disorder    Past Surgical History  Procedure Date  . Appendectomy   . Tonsillectomy   . Tubal ligation   . Laparoscopic assisted vaginal hysterectomy 08/19/2011    Procedure: LAPAROSCOPIC ASSISTED VAGINAL HYSTERECTOMY;  Surgeon: Miguel Aschoff, MD;  Location: [facility];  Service: Gynecology;  Laterality: N/A;  . Abdominal hysterectomy     Family History  Problem Relation Age of Onset  . Coronary artery disease Father   . Coronary artery disease Maternal Grandmother   . Breast cancer Mother   . Cancer Mother     History  Substance Use Topics  . Smoking status: Current Everyday Smoker -- 1.0 packs/day for 14 years  . Smokeless tobacco: Not on file  . Alcohol  Use: No    OB History    Grav Para Term Preterm Abortions TAB SAB Ect Mult Living   6 2              Review of Systems A complete 10 system review of systems was obtained and all systems are negative except as noted in the HPI and PMH.   Allergies  Epidural tray 17gx3-1-2"; Ibuprofen; Penicillins; and Zofran  Home Medications   Current Outpatient Rx  Name Route Sig Dispense Refill  . ACETAMINOPHEN 500 MG PO TABS Oral Take 2,000 mg by mouth 2 (two) times daily as needed. pain    . AMITRIPTYLINE HCL 100 MG PO TABS Oral Take 100 mg by mouth at bedtime.    Marland Kitchen CLONAZEPAM 1 MG PO TABS Oral Take 0.5-1 mg by mouth 2 (two) times daily. Takes 0.5 mg in the morning and 1mg  in the evening    . LISDEXAMFETAMINE DIMESYLATE 70 MG PO CAPS Oral Take 70 mg by mouth every morning.    Marland Kitchen MORPHINE SULFATE ER 100 MG PO TBCR Oral Take 100 mg by mouth 2 (two) times daily.    . OXYCODONE HCL 15 MG PO TABS Oral Take 15 mg by mouth every 4 (four) hours as needed. pain    . PANTOPRAZOLE SODIUM 40 MG PO TBEC Oral Take 1 tablet (40 mg total) by mouth daily at 12 noon. 30 tablet 0  .  ZOLPIDEM TARTRATE 10 MG PO TABS Oral Take 10 mg by mouth at bedtime as needed. For sleep      BP 123/78  Pulse 114  Temp 99 F (37.2 C)  Resp 22  SpO2 100%  LMP 08/10/2011  Physical Exam  Constitutional: She is oriented to person, place, and time. She appears well-developed and well-nourished.  HENT:  Head: Normocephalic and atraumatic.  Cardiovascular:  Pulses:      Dorsalis pedis pulses are 2+ on the right side, and 2+ on the left side.       Posterior tibial pulses are 2+ on the right side, and 2+ on the left side.  Musculoskeletal: Normal range of motion.       Lumbar back: She exhibits tenderness.       5/5 strength in bilateral lower extremities. Great toe dorsal flexion is intact. Ankle flexion and extension intact.  Tenderness in midline lumbar spine. No step offs.  Neurological: She is alert and oriented to  person, place, and time. She has normal strength.  Reflex Scores:      Patellar reflexes are 2+ on the right side and 2+ on the left side. Psychiatric: She has a normal mood and affect. Her behavior is normal.    ED Course  Procedures (including critical care time) DIAGNOSTIC STUDIES: Oxygen Saturation is 100% on room air, normal by my interpretation.    COORDINATION OF CARE: 2:14pm- Patient informed of current plan for treatment and evaluation and agrees with plan at this time. Will administer 1 tablet of Percocet 5-325 mg. Have ordered X-ray of lumbar spine.   Labs Reviewed - No data to display  Dg Lumbar Spine Complete  09/08/2011  *RADIOLOGY REPORT*  Clinical Data: Fall.  Low back injury and pain.  LUMBAR SPINE - COMPLETE 4+ VIEW  Comparison:  None.  Findings:  There is no evidence of lumbar spine fracture. Alignment is normal.  Intervertebral disc spaces are maintained.  IMPRESSION: Negative.   Original Report Authenticated By: Danae [name], M.D.      No diagnosis found.    MDM  Lower back pain after alleged fall this morning. History of chronic back pain as well as hysterectomy 3 weeks ago. No weakness, numbness, tingling, bowel bladder incontinence, fevers or vomiting. Patient states her chronic pain medications including morphine and oxycodone were in her car which was repossessed. Seen at [facility] 8/16, [facility] 8/18.  No evidence of fracture. No evidence of cauda equina or cord compression. Vital signs stable, no distress. Pain treated in the ED. Explained to the patient we do not refill chronic pain medications.  Narcotic database showed 34 day supply of oxycodone and morphine written on 8/1.    I personally performed the services described in this documentation, which was scribed in my presence.  The recorded information has been reviewed and considered.    Glynn Octave, MD 09/08/11 1535

## 2011-09-08 NOTE — ED Notes (Signed)
Patient transported to X-ray 

## 2011-09-10 ENCOUNTER — Emergency Department (HOSPITAL_COMMUNITY)
Admission: EM | Admit: 2011-09-10 | Discharge: 2011-09-11 | Disposition: A | Discharge status: Home / Self Care | Payer: Medicaid Other | Attending: Emergency Medicine | Admitting: Emergency Medicine

## 2011-09-10 ENCOUNTER — Encounter (HOSPITAL_COMMUNITY): Payer: Self-pay | Admitting: *Deleted

## 2011-09-10 DIAGNOSIS — R45851 Suicidal ideations: Secondary | ICD-10-CM | POA: Insufficient documentation

## 2011-09-10 DIAGNOSIS — G8929 Other chronic pain: Secondary | ICD-10-CM | POA: Insufficient documentation

## 2011-09-10 DIAGNOSIS — Z9089 Acquired absence of other organs: Secondary | ICD-10-CM | POA: Insufficient documentation

## 2011-09-10 DIAGNOSIS — R002 Palpitations: Secondary | ICD-10-CM | POA: Insufficient documentation

## 2011-09-10 DIAGNOSIS — F17200 Nicotine dependence, unspecified, uncomplicated: Secondary | ICD-10-CM | POA: Insufficient documentation

## 2011-09-10 DIAGNOSIS — Z79899 Other long term (current) drug therapy: Secondary | ICD-10-CM | POA: Insufficient documentation

## 2011-09-10 DIAGNOSIS — R000 Tachycardia, unspecified: Secondary | ICD-10-CM

## 2011-09-10 DIAGNOSIS — F411 Generalized anxiety disorder: Secondary | ICD-10-CM | POA: Insufficient documentation

## 2011-09-10 DIAGNOSIS — F19939 Other psychoactive substance use, unspecified with withdrawal, unspecified: Secondary | ICD-10-CM | POA: Insufficient documentation

## 2011-09-10 DIAGNOSIS — K219 Gastro-esophageal reflux disease without esophagitis: Secondary | ICD-10-CM | POA: Insufficient documentation

## 2011-09-10 DIAGNOSIS — R112 Nausea with vomiting, unspecified: Secondary | ICD-10-CM

## 2011-09-10 DIAGNOSIS — F1120 Opioid dependence, uncomplicated: Secondary | ICD-10-CM | POA: Insufficient documentation

## 2011-09-10 DIAGNOSIS — R109 Unspecified abdominal pain: Secondary | ICD-10-CM

## 2011-09-10 DIAGNOSIS — F1123 Opioid dependence with withdrawal: Secondary | ICD-10-CM

## 2011-09-10 HISTORY — DX: Reserved for concepts with insufficient information to code with codable children: IMO0002

## 2011-09-10 LAB — RAPID URINE DRUG SCREEN, HOSP PERFORMED
Benzodiazepines: NOT DETECTED
Cocaine: NOT DETECTED
Opiates: POSITIVE — AB

## 2011-09-10 LAB — URINE MICROSCOPIC-ADD ON

## 2011-09-10 LAB — COMPREHENSIVE METABOLIC PANEL
AST: 28 U/L (ref 0–37)
Albumin: 3.5 g/dL (ref 3.5–5.2)
BUN: 9 mg/dL (ref 6–23)
Calcium: 8.8 mg/dL (ref 8.4–10.5)
Chloride: 109 mEq/L (ref 96–112)
Creatinine, Ser: 0.57 mg/dL (ref 0.50–1.10)
GFR calc non Af Amer: >90 mL/min (ref >90)
Total Bilirubin: 0.4 mg/dL (ref 0.3–1.2)

## 2011-09-10 LAB — POCT I-STAT, CHEM 8
BUN: 8 mg/dL (ref 6–23)
Calcium, Ion: 1.15 mmol/L (ref 1.12–1.23)
Chloride: 109 mEq/L (ref 96–112)
Creatinine, Ser: 0.5 mg/dL (ref 0.50–1.10)
Glucose, Bld: 99 mg/dL (ref 70–99)
Potassium: 3.3 mEq/L — ABNORMAL LOW (ref 3.5–5.1)

## 2011-09-10 LAB — CBC WITH DIFFERENTIAL/PLATELET
Basophils Absolute: 0 10*3/uL (ref 0.0–0.1)
Basophils Relative: 0 % (ref 0–1)
Eosinophils Relative: 1 % (ref 0–5)
HCT: 36.6 % (ref 36.0–46.0)
Hemoglobin: 12.7 g/dL (ref 12.0–15.0)
MCH: 31.6 pg (ref 26.0–34.0)
MCHC: 34.7 g/dL (ref 30.0–36.0)
MCV: 91 fL (ref 78.0–100.0)
Monocytes Absolute: 0.6 10*3/uL (ref 0.1–1.0)
Monocytes Relative: 8 % (ref 3–12)
Neutro Abs: 6 10*3/uL (ref 1.7–7.7)
RDW: 12.4 % (ref 11.5–15.5)

## 2011-09-10 LAB — URINALYSIS, ROUTINE W REFLEX MICROSCOPIC
Glucose, UA: NEGATIVE mg/dL
Hgb urine dipstick: NEGATIVE
Protein, ur: NEGATIVE mg/dL
Specific Gravity, Urine: 1.027 (ref 1.005–1.030)
pH: 6 (ref 5.0–8.0)

## 2011-09-10 MED ORDER — PANTOPRAZOLE SODIUM 40 MG PO TBEC
40.0000 mg | DELAYED_RELEASE_TABLET | Freq: Every day | ORAL | Status: DC
  Administered 2011-09-11: 40 mg via ORAL
  Filled 2011-09-10 (×2): qty 1

## 2011-09-10 MED ORDER — ACETAMINOPHEN 325 MG PO TABS
650.0000 mg | ORAL_TABLET | ORAL | Status: DC | PRN
  Administered 2011-09-11 (×2): 650 mg via ORAL
  Filled 2011-09-10 (×2): qty 2

## 2011-09-10 MED ORDER — PROMETHAZINE HCL 25 MG/ML IJ SOLN
25.0000 mg | Freq: Once | INTRAMUSCULAR | Status: AC
  Administered 2011-09-10: 25 mg via INTRAMUSCULAR
  Filled 2011-09-10: qty 1

## 2011-09-10 MED ORDER — LORAZEPAM 2 MG/ML IJ SOLN
1.0000 mg | Freq: Once | INTRAMUSCULAR | Status: AC
  Administered 2011-09-10: 1 mg via INTRAVENOUS
  Filled 2011-09-10: qty 1

## 2011-09-10 MED ORDER — LORAZEPAM 1 MG PO TABS
1.0000 mg | ORAL_TABLET | Freq: Three times a day (TID) | ORAL | Status: DC | PRN
  Administered 2011-09-10 – 2011-09-11 (×3): 1 mg via ORAL
  Filled 2011-09-10 (×4): qty 1

## 2011-09-10 MED ORDER — ZOLPIDEM TARTRATE 5 MG PO TABS
5.0000 mg | ORAL_TABLET | Freq: Every evening | ORAL | Status: DC | PRN
  Administered 2011-09-10 (×2): 5 mg via ORAL
  Filled 2011-09-10 (×2): qty 1

## 2011-09-10 MED ORDER — ONDANSETRON HCL 4 MG/2ML IJ SOLN: 4.0000 mg | Freq: Once | INTRAMUSCULAR | Status: DC

## 2011-09-10 MED ORDER — PROMETHAZINE HCL 25 MG/ML IJ SOLN
25.0000 mg | Freq: Four times a day (QID) | INTRAMUSCULAR | Status: DC | PRN
  Administered 2011-09-10 – 2011-09-11 (×5): 25 mg via INTRAVENOUS
  Filled 2011-09-10 (×6): qty 1

## 2011-09-10 MED ORDER — DROPERIDOL 2.5 MG/ML IJ SOLN: 2.5000 mg | Freq: Once | INTRAMUSCULAR | Status: DC

## 2011-09-10 MED ORDER — AMITRIPTYLINE HCL 100 MG PO TABS
100.0000 mg | ORAL_TABLET | Freq: Every day | ORAL | Status: DC
  Administered 2011-09-10: 100 mg via ORAL
  Filled 2011-09-10 (×2): qty 1

## 2011-09-10 MED ORDER — METOCLOPRAMIDE HCL 5 MG/ML IJ SOLN
10.0000 mg | Freq: Once | INTRAMUSCULAR | Status: AC
  Administered 2011-09-10: 10 mg via INTRAVENOUS
  Filled 2011-09-10: qty 2

## 2011-09-10 MED ORDER — SODIUM CHLORIDE 0.9 % IV SOLN
1000.0000 mL | Freq: Once | INTRAVENOUS | Status: AC
  Administered 2011-09-10: 1000 mL via INTRAVENOUS

## 2011-09-10 MED ORDER — AMITRIPTYLINE HCL 100 MG PO TABS
100.0000 mg | ORAL_TABLET | Freq: Every day | ORAL | Status: DC
  Administered 2011-09-10 – 2011-09-11 (×2): 100 mg via ORAL
  Filled 2011-09-10: qty 1

## 2011-09-10 MED ORDER — SODIUM CHLORIDE 0.9 % IV SOLN
1000.0000 mL | INTRAVENOUS | Status: DC
  Administered 2011-09-10 – 2011-09-11 (×5): 1000 mL via INTRAVENOUS

## 2011-09-10 MED ORDER — DIPHENHYDRAMINE HCL 50 MG/ML IJ SOLN
25.0000 mg | Freq: Once | INTRAMUSCULAR | Status: AC
  Administered 2011-09-10: 25 mg via INTRAVENOUS
  Filled 2011-09-10: qty 1

## 2011-09-10 MED ORDER — NITROFURANTOIN MONOHYD MACRO 100 MG PO CAPS
100.0000 mg | ORAL_CAPSULE | Freq: Two times a day (BID) | ORAL | Status: DC
  Administered 2011-09-10 – 2011-09-11 (×3): 100 mg via ORAL
  Filled 2011-09-10 (×4): qty 1

## 2011-09-10 MED ORDER — PANTOPRAZOLE SODIUM 40 MG IV SOLR
40.0000 mg | Freq: Once | INTRAVENOUS | Status: AC
  Administered 2011-09-10: 40 mg via INTRAVENOUS
  Filled 2011-09-10: qty 40

## 2011-09-10 MED ORDER — PROMETHAZINE HCL 25 MG/ML IJ SOLN
25.0000 mg | INTRAMUSCULAR | Status: AC
  Administered 2011-09-10: 25 mg via INTRAMUSCULAR
  Filled 2011-09-10 (×2): qty 1

## 2011-09-10 NOTE — ED Notes (Addendum)
Pt c/o lower back pain x 5 days.  States has lost medication due to car being repossessed.  States pain has become unbearable.  C/o n/v for past 3 days, last vomited approx 1 hour ago.

## 2011-09-10 NOTE — ED Notes (Signed)
Pt. reports that she "can't go home like this and she knows when she goes home that she is going to kill herself." PT. States, I am in pain and the doctor says once my vomiting is under control she will give me a percocet!  I rather just kill myself.  Is there a mental health facility that I can go to?"  Dr. Norlene Campbell immediatly notified, pt. Placed in blue scrubs, and suicidal protocol started.

## 2011-09-10 NOTE — ED Notes (Signed)
Patient with history of lower back pain, which is chronic.  Patient states that she is out of meds due to car being repossessed and meds in her car.  Patient is now having nausea and vomiting for past three days.  Patient states that she has not been able to keep fluids or food down in the last three days.

## 2011-09-10 NOTE — ED Notes (Signed)
Attempted IV x2, unsuccessful at this time.  IV team paged and returned page.  IV team enroute to ED.

## 2011-09-10 NOTE — ED Notes (Signed)
IV re-taped to keep from coming out.

## 2011-09-10 NOTE — ED Notes (Addendum)
Patient states she will kill herself if she is discharged home without medications and she lost her medications that were in her car. Patient states she has degenerative disc disease and needs her pain medications to deal with her pain. Patient does not have a plan but states she will find a way to kill herself if she does not get her pain medication because she can not deal with the pain. Sitter at bedside.  Patient is actively vomiting at this time.

## 2011-09-10 NOTE — ED Provider Notes (Addendum)
Pt awaiting [facility] placement.  nv earlier, relieved/resolved w phenergan im, reglan.  Pt comfortable. No c/o. No pain. No nv.  Urine culture pending. Will rx pending cx results.   Suzi Roots, MD 09/10/11 1610  Suzi Roots, MD 09/10/11 [phone]

## 2011-09-10 NOTE — Progress Notes (Signed)
Pt is having persistent vomiting, not responding to IV Phenergan or Reglan.  She says her tongue swells with IV Zofran.  Will try IV Ativan 1 mg.  She requested IV narcotics.  I advised her that this is not the place to get chronic narcotic pain medicine.  She will be evaluated by [facility] for her threat of killing herself, and for narcotic detox.

## 2011-09-10 NOTE — BH Assessment (Signed)
Assessment Note   [name] [name] is an 31 y.o. female that presented in Opioid Withdrawals.  Pt reports having her car repossessed four days ago and "my meds were in it."  Pt has been on multiple Opioids (prescribed per pt) for DDD, Arthritis, and chronic Migraines and is now exhibiting serious withdrawal symptoms, including Hyperemesis, joint and body aches, restlessness, and increased anxiety (recent COWs is an 11).  Pt reports taking Klonopin, Vyvance,Morphine/MSContin, Oxycodone, and Ambien QD.  Pt is currently being treated for her Hyperemesis and dehydration by Dr. Ignacia Palma, who will not be treating with Opioids.  Pt admitted upon admission that if she is discharged this way "then I will just go home and kill myself.  I cannot live like this."   Pt denies HI or any active psychosis, but is not able to contract for safety at this time.  Pt does not have any current psychiatric providers and denies any other mental health history.  Pt currently resides with her husband, who she prescribes as minimally supportive.  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 Migraine   . GERD (gastroesophageal reflux disease)     protonix evenings  . Endometriosis     chronic pain  . Anxiety     klonipin for stress disorder  . Degenerative disc disease    Axis IV: other psychosocial or environmental problems, problems related to social environment, problems with access to health care services and problems with primary support group Axis V: 21-30 behavior considerably influenced by delusions or hallucinations OR serious impairment in judgment, communication OR inability to function in almost all areas  Past Medical History:  Past Medical History  Diagnosis Date  . Migraine   . GERD (gastroesophageal reflux disease)     protonix evenings  . Endometriosis     chronic pain  . Anxiety     klonipin for  stress disorder  . Degenerative disc disease     Past Surgical History  Procedure Date  . Appendectomy   . Tonsillectomy   . Tubal ligation   . Laparoscopic assisted vaginal hysterectomy 08/19/2011    Procedure: LAPAROSCOPIC ASSISTED VAGINAL HYSTERECTOMY;  Surgeon: Miguel Aschoff, MD;  Location: [facility];  Service: Gynecology;  Laterality: N/A;  . Abdominal hysterectomy     Family History:  Family History  Problem Relation Age of Onset  . Coronary artery disease Father   . Coronary artery disease Maternal Grandmother   . Breast cancer Mother   . Cancer Mother     Social History:  reports that she has been smoking.  She does not have any smokeless tobacco history on file. She reports that she does not drink alcohol or use illicit drugs.  Additional Social History:  Alcohol / Drug Use Pain Medications: Yes; see MAR Prescriptions: Yes; see MAR Over the Counter: Yes; see MAR History of alcohol / drug use?:  (Pt denies that she is abusing her pain medications.) Longest period of sobriety (when/how long): cannot verify  CIWA: CIWA-Ar BP: 135/79 mmHg Pulse Rate: 102  COWS: Clinical Opiate Withdrawal Scale (COWS) Resting Pulse Rate: Pulse Rate 101-120 Sweating: No report of chills or flushing Restlessness: Reports difficulty sitting still, but is able to do so Pupil Size: Pupils pinned or normal size for room light Bone or Joint Aches: Patient reports sever diffuse aching of joints/muscles Runny Nose or Tearing: Not present GI Upset: Multiple episodes  of diarrhea or vomiting Tremor: No tremor Yawning: No yawning Anxiety or Irritability: Patient reports increasing irritability or anxiousness Gooseflesh Skin: Skin is smooth COWS Total Score: 11   Allergies:  Allergies  Allergen Reactions  . Epidural Tray 17gx3-1-2" (Nerve Block Tray) Other (See Comments)    Paralysis and severe pain in head/neck/shoulders.  No anaphylaxis.  . Ibuprofen Hives  . Penicillins Hives  . Zofran  (Ondansetron Hcl) Nausea And Vomiting    Home Medications:  (Not in a hospital admission)  OB/GYN Status:  Patient's last menstrual period was 08/10/2011.  General Assessment Data Location of Assessment: [facility] ED Living Arrangements: Spouse/significant other Can pt return to current living arrangement?: Yes Admission Status: Voluntary Is patient capable of signing voluntary admission?: Yes Transfer from: Acute Hospital Referral Source: Self/Family/Friend  Education Status Is patient currently in school?: No  Risk to self Suicidal Ideation: Yes-Currently Present Suicidal Intent: No Is patient at risk for suicide?: Yes Suicidal Plan?: No Access to Means: Yes Specify Access to Suicidal Means: available means when not  in emergency department What has been your use of drugs/alcohol within the last 12 months?:  (Pt denies abuse of her medications) Previous Attempts/Gestures: Yes How many times?: 0  Other Self Harm Risks: impulsive and reckless Triggers for Past Attempts: Unpredictable;Family contact;Other personal contacts Intentional Self Injurious Behavior: None Family Suicide History: No Recent stressful life event(s): Conflict (Comment);Turmoil (Comment);Loss (Comment) (has had her car repossessed along with her prescription medi) Persecutory voices/beliefs?: No Depression: Yes Depression Symptoms: Feeling worthless/self pity;Loss of interest in usual pleasures;Fatigue Substance abuse history and/or treatment for substance abuse?: No Suicide prevention information given to non-admitted patients: Not applicable  Risk to Others Homicidal Ideation: No Thoughts of Harm to Others: No Current Homicidal Intent: No Current Homicidal Plan: No Access to Homicidal Means: No Identified Victim: none History of harm to others?: No Assessment of Violence: None Noted Violent Behavior Description: none noted Does patient have access to weapons?: No Criminal Charges Pending?: No Does  patient have a court date: No  Psychosis Hallucinations: None noted Delusions: None noted  Mental Status Report Appear/Hygiene: Disheveled;Poor hygiene Eye Contact: Fair Motor Activity: Unsteady;Tremors Speech: Slurred;Logical/coherent Level of Consciousness: Irritable Mood: Anxious;Depressed;Sad Affect: Anxious;Apathetic;Apprehensive Anxiety Level: Moderate Thought Processes: Relevant Judgement: Impaired Orientation: Person;Place;Time;Situation Obsessive Compulsive Thoughts/Behaviors: Moderate  Cognitive Functioning Concentration: Decreased Memory: Recent Impaired;Remote Intact IQ: Average Insight: Poor Impulse Control: Poor Appetite: Poor Weight Loss: 5  Weight Gain: 0  Sleep: Decreased Total Hours of Sleep: 2  Vegetative Symptoms: None  ADLScreening [facility] Assessment Services) Patient's cognitive ability adequate to safely complete daily activities?: Yes Patient able to express need for assistance with ADLs?: Yes Independently performs ADLs?: Yes (appropriate for developmental age)  Abuse/Neglect [facility]) Physical Abuse: Denies Verbal Abuse: Denies Sexual Abuse: Denies  Prior Inpatient Therapy Prior Inpatient Therapy: No Prior Therapy Dates: none noted Prior Therapy Facilty/Provider(s): none noted Reason for Treatment: n/a  Prior Outpatient Therapy Prior Outpatient Therapy: No Prior Therapy Dates: n/a Prior Therapy Facilty/Provider(s): none noted Reason for Treatment: n/a  ADL Screening (condition at time of admission) Patient's cognitive ability adequate to safely complete daily activities?: Yes Patient able to express need for assistance with ADLs?: Yes Independently performs ADLs?: Yes (appropriate for developmental age)       Abuse/Neglect Assessment (Assessment to be complete while patient is alone) Physical Abuse: Denies Verbal Abuse: Denies Sexual Abuse: Denies Exploitation of patient/patient's resources: Denies Self-Neglect: Denies Values /  Beliefs Cultural Requests During Hospitalization: None Spiritual Requests During Hospitalization:  None   Advance Directives (For Healthcare) Advance Directive: Patient does not have advance directive    Additional Information 1:1 In Past 12 Months?: No CIRT Risk: No Elopement Risk: No Does patient have medical clearance?: Yes     Disposition: Pt is on IV fluids, Phenerghan to treat Hyperemesis and Opioid withdrawals.  Once more medically stable will determine next course of action for pt (Inpatient admission versus Telespsych with outpatient referrals). Disposition Disposition of Patient: Other dispositions Other disposition(s):  (Awaiting medical clearance and then Telepsych.  )  On Site Evaluation by:   Reviewed with Physician:     Angelica Ran 09/10/2011 11:07 AM

## 2011-09-10 NOTE — ED Notes (Signed)
Pt. Noted vomiting.  Dr. Norlene Campbell is aware.
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
## 2011-09-11 MED ORDER — ONDANSETRON HCL 4 MG/2ML IJ SOLN
4.0000 mg | Freq: Once | INTRAMUSCULAR | Status: AC
  Administered 2011-09-11: 4 mg via INTRAVENOUS

## 2011-09-11 MED ORDER — NICOTINE 21 MG/24HR TD PT24
21.0000 mg | MEDICATED_PATCH | Freq: Once | TRANSDERMAL | Status: DC
  Administered 2011-09-11: 21 mg via TRANSDERMAL
  Filled 2011-09-11: qty 1

## 2011-09-11 MED ORDER — CYCLOBENZAPRINE HCL 10 MG PO TABS
10.0000 mg | ORAL_TABLET | Freq: Three times a day (TID) | ORAL | Status: DC | PRN
  Administered 2011-09-11 (×2): 10 mg via ORAL
  Filled 2011-09-11 (×2): qty 1

## 2011-09-11 MED ORDER — ONDANSETRON HCL 4 MG/2ML IJ SOLN
INTRAMUSCULAR | Status: AC
  Administered 2011-09-11: 4 mg via INTRAVENOUS
  Filled 2011-09-11: qty 2

## 2011-09-11 MED ORDER — METOCLOPRAMIDE HCL 5 MG/ML IJ SOLN
10.0000 mg | Freq: Once | INTRAMUSCULAR | Status: AC
  Administered 2011-09-11: 10 mg via INTRAVENOUS
  Filled 2011-09-11: qty 2

## 2011-09-11 MED ORDER — LORAZEPAM 1 MG PO TABS
1.0000 mg | ORAL_TABLET | Freq: Four times a day (QID) | ORAL | Status: DC | PRN
  Administered 2011-09-11 (×2): 1 mg via ORAL
  Filled 2011-09-11 (×3): qty 1

## 2011-09-11 MED ORDER — OXYCODONE HCL 80 MG PO TB12: 80.0000 mg | ORAL_TABLET | Freq: Two times a day (BID) | ORAL | Status: DC

## 2011-09-11 MED ORDER — OXYCODONE-ACETAMINOPHEN 10-325 MG PO TABS: 1.0000 | ORAL_TABLET | Freq: Four times a day (QID) | ORAL | Status: DC | PRN

## 2011-09-11 MED ORDER — ALUM & MAG HYDROXIDE-SIMETH 200-200-20 MG/5ML PO SUSP
15.0000 mL | Freq: Four times a day (QID) | ORAL | Status: DC | PRN
  Administered 2011-09-11 (×3): 15 mL via ORAL
  Filled 2011-09-11 (×3): qty 30

## 2011-09-11 NOTE — ED Notes (Addendum)
Upon introducing myself, pt found sleeping. Once awakened pt states that she is having bad "cramps" ever since her hysterectomy. She is requesting medication for abdominal pain however when questioned if she spoke to the MD about the pain she states that she hasn't seen a doctor. MD was in pt's room less than one hour prior. Pt encouraged to rest at this time and will continue to monitor for worsening pain. Pt not interested in PRN dose of tylenol at this time [facility] [employer]

## 2011-09-11 NOTE — ED Provider Notes (Signed)
  Physical Exam  BP 154/99  Pulse 69  Temp 98.2 F (36.8 C) (Oral)  Resp 20  SpO2 99%  LMP 08/10/2011  Physical Exam  ED Course  Procedures  MDM Patient sleeping comfortably. Reportedly suicidal after loss of her pain medicines. Possible placement at [facility] R. Rubin Payor, MD 09/11/11 [phone]

## 2011-09-11 NOTE — ED Notes (Signed)
Pt approached the desk stating that she is not able to get comfortable and knows that the doctor won't right for a narcotic but was wondering if he would be willing to give her a muscle relaxer.

## 2011-09-11 NOTE — ED Provider Notes (Signed)
Pt has been evaluated by the ACT Team.  She no longer endorses SI and has contracted for her own safety.  She has been advised that we will not prescribe pain medication that will last the next week as she has requested.  She has a specialist that has been managing her chronic pain issues.  Pt has been informed that on discharge she will receive prescriptions for 48 hrs worth of her already prescribed pain meds.  She has information for outpt f/u.  Plan D/C now.  Tobin Chad, MD 09/11/11 2241

## 2011-09-11 NOTE — ED Notes (Signed)
Pt continues to vomit and dry heave. Dr. Lorenso Courier made aware and verbal order for reglan received

## 2011-09-11 NOTE — ED Notes (Signed)
Dr. Rubin Payor made aware of pt request for a muscle relaxer.

## 2011-09-11 NOTE — Plan of Care (Signed)
Pt was declined last night (8/22) by Donell Sievert, PA for lack of inpatient criteria.  Telepsych was completed last night and made these recommendations: "Pt reports that only if she does not get help for her pain she will threat to harm herself. In my opinion she is suffering from acute withdrawal from opiates, she should get treatment for it now. Pt is not acutely psychotic or in major depression, all her symptoms are related to her opiate addiction. She does not require psych admission at present but does requires detox or pain management treatment."

## 2011-09-11 NOTE — ED Notes (Signed)
Pt requesting something for nausea at this time.

## 2011-09-11 NOTE — ED Notes (Signed)
Pt actively vomiting after returning from bathroom. Verbal order received for zofran IV.

## 2011-09-11 NOTE — BH Assessment (Signed)
[facility] Assessment Progress Note      Received call from [facility] stating pt declined by PA [facility].  Detox was recommended for pt.  See previous note from [facility] for further details.  Oncoming ACT will need to consult with EDP for further recommendations and/or refer elsewhere for detox.

## 2011-09-12 LAB — URINE CULTURE
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
## 2011-09-14 ENCOUNTER — Encounter (HOSPITAL_COMMUNITY): Payer: Self-pay | Admitting: Emergency Medicine

## 2011-09-14 ENCOUNTER — Emergency Department (HOSPITAL_COMMUNITY)
Admission: EM | Admit: 2011-09-14 | Discharge: 2011-09-14 | Disposition: A | Discharge status: Home / Self Care | Payer: Medicaid Other | Attending: Emergency Medicine | Admitting: Emergency Medicine

## 2011-09-14 DIAGNOSIS — F17200 Nicotine dependence, unspecified, uncomplicated: Secondary | ICD-10-CM | POA: Insufficient documentation

## 2011-09-14 DIAGNOSIS — M549 Dorsalgia, unspecified: Secondary | ICD-10-CM

## 2011-09-14 DIAGNOSIS — K219 Gastro-esophageal reflux disease without esophagitis: Secondary | ICD-10-CM | POA: Insufficient documentation

## 2011-09-14 DIAGNOSIS — IMO0002 Reserved for concepts with insufficient information to code with codable children: Secondary | ICD-10-CM | POA: Insufficient documentation

## 2011-09-14 DIAGNOSIS — Z8742 Personal history of other diseases of the female genital tract: Secondary | ICD-10-CM | POA: Insufficient documentation

## 2011-09-14 DIAGNOSIS — Z880 Allergy status to penicillin: Secondary | ICD-10-CM | POA: Insufficient documentation

## 2011-09-14 DIAGNOSIS — G8929 Other chronic pain: Secondary | ICD-10-CM | POA: Insufficient documentation

## 2011-09-14 DIAGNOSIS — F411 Generalized anxiety disorder: Secondary | ICD-10-CM | POA: Insufficient documentation

## 2011-09-14 MED ORDER — OXYCODONE HCL 10 MG PO TB12
10.0000 mg | ORAL_TABLET | Freq: Once | ORAL | Status: AC
Start: 2011-09-14 — End: 2011-09-14
  Administered 2011-09-14: 10 mg via ORAL
  Filled 2011-09-14: qty 1

## 2011-09-14 MED ORDER — OXYCODONE-ACETAMINOPHEN 5-325 MG PO TABS
1.0000 | ORAL_TABLET | Freq: Once | ORAL | Status: AC
  Administered 2011-09-14: 1 via ORAL
  Filled 2011-09-14: qty 1

## 2011-09-14 NOTE — ED Notes (Signed)
C/o lower back pain that has been worse since this morning.  Pt states she was discharged from ED on 8/22 with a 2 day supply of pain medication.

## 2011-09-14 NOTE — ED Notes (Signed)
Patient is alertx4, NAD.
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
## 2011-09-16 NOTE — ED Provider Notes (Signed)
Medical screening examination/treatment/procedure(s) were performed by non-physician practitioner and as supervising physician I was immediately available for consultation/collaboration.   Richardean Canal, MD 09/16/11 [phone]

## 2011-11-06 ENCOUNTER — Encounter (HOSPITAL_COMMUNITY): Payer: Self-pay

## 2011-12-27 ENCOUNTER — Inpatient Hospital Stay (HOSPITAL_COMMUNITY)
Admission: RE | Admit: 2011-12-27 | Discharge: 2012-01-05 | DRG: 897 | Disposition: A | Discharge status: Home / Self Care | Payer: Medicaid Other | Source: Ambulatory Visit | Attending: Psychiatry | Admitting: Psychiatry

## 2011-12-27 ENCOUNTER — Emergency Department (HOSPITAL_COMMUNITY)
Admission: EM | Admit: 2011-12-27 | Discharge: 2011-12-27 | Disposition: A | Discharge status: Other Acute Inpatient Hospital | Payer: Medicaid Other | Attending: Emergency Medicine | Admitting: Emergency Medicine

## 2011-12-27 ENCOUNTER — Encounter (HOSPITAL_COMMUNITY): Payer: Self-pay | Admitting: *Deleted

## 2011-12-27 DIAGNOSIS — M549 Dorsalgia, unspecified: Secondary | ICD-10-CM

## 2011-12-27 DIAGNOSIS — IMO0002 Reserved for concepts with insufficient information to code with codable children: Secondary | ICD-10-CM | POA: Insufficient documentation

## 2011-12-27 DIAGNOSIS — Z8742 Personal history of other diseases of the female genital tract: Secondary | ICD-10-CM | POA: Insufficient documentation

## 2011-12-27 DIAGNOSIS — T43205A Adverse effect of unspecified antidepressants, initial encounter: Secondary | ICD-10-CM | POA: Insufficient documentation

## 2011-12-27 DIAGNOSIS — G8929 Other chronic pain: Secondary | ICD-10-CM | POA: Diagnosis present

## 2011-12-27 DIAGNOSIS — F1994 Other psychoactive substance use, unspecified with psychoactive substance-induced mood disorder: Secondary | ICD-10-CM

## 2011-12-27 DIAGNOSIS — R112 Nausea with vomiting, unspecified: Secondary | ICD-10-CM

## 2011-12-27 DIAGNOSIS — D72829 Elevated white blood cell count, unspecified: Secondary | ICD-10-CM

## 2011-12-27 DIAGNOSIS — Z3202 Encounter for pregnancy test, result negative: Secondary | ICD-10-CM | POA: Insufficient documentation

## 2011-12-27 DIAGNOSIS — R000 Tachycardia, unspecified: Secondary | ICD-10-CM

## 2011-12-27 DIAGNOSIS — F1120 Opioid dependence, uncomplicated: Secondary | ICD-10-CM

## 2011-12-27 DIAGNOSIS — F1010 Alcohol abuse, uncomplicated: Secondary | ICD-10-CM | POA: Diagnosis present

## 2011-12-27 DIAGNOSIS — F32A Depression, unspecified: Secondary | ICD-10-CM

## 2011-12-27 DIAGNOSIS — K219 Gastro-esophageal reflux disease without esophagitis: Secondary | ICD-10-CM | POA: Insufficient documentation

## 2011-12-27 DIAGNOSIS — F329 Major depressive disorder, single episode, unspecified: Secondary | ICD-10-CM | POA: Diagnosis present

## 2011-12-27 DIAGNOSIS — T43011A Poisoning by tricyclic antidepressants, accidental (unintentional), initial encounter: Secondary | ICD-10-CM | POA: Insufficient documentation

## 2011-12-27 DIAGNOSIS — G43909 Migraine, unspecified, not intractable, without status migrainosus: Secondary | ICD-10-CM | POA: Diagnosis present

## 2011-12-27 DIAGNOSIS — R45851 Suicidal ideations: Secondary | ICD-10-CM | POA: Insufficient documentation

## 2011-12-27 DIAGNOSIS — Z79899 Other long term (current) drug therapy: Secondary | ICD-10-CM

## 2011-12-27 DIAGNOSIS — F17200 Nicotine dependence, unspecified, uncomplicated: Secondary | ICD-10-CM | POA: Insufficient documentation

## 2011-12-27 DIAGNOSIS — Z008 Encounter for other general examination: Secondary | ICD-10-CM | POA: Insufficient documentation

## 2011-12-27 DIAGNOSIS — F19939 Other psychoactive substance use, unspecified with withdrawal, unspecified: Principal | ICD-10-CM | POA: Diagnosis present

## 2011-12-27 DIAGNOSIS — T50901A Poisoning by unspecified drugs, medicaments and biological substances, accidental (unintentional), initial encounter: Secondary | ICD-10-CM

## 2011-12-27 DIAGNOSIS — R109 Unspecified abdominal pain: Secondary | ICD-10-CM

## 2011-12-27 DIAGNOSIS — F1020 Alcohol dependence, uncomplicated: Secondary | ICD-10-CM

## 2011-12-27 DIAGNOSIS — F3289 Other specified depressive episodes: Secondary | ICD-10-CM | POA: Insufficient documentation

## 2011-12-27 DIAGNOSIS — F411 Generalized anxiety disorder: Secondary | ICD-10-CM | POA: Diagnosis present

## 2011-12-27 DIAGNOSIS — N809 Endometriosis, unspecified: Secondary | ICD-10-CM | POA: Diagnosis present

## 2011-12-27 LAB — URINALYSIS, ROUTINE W REFLEX MICROSCOPIC
Leukocytes, UA: NEGATIVE
Protein, ur: NEGATIVE mg/dL
Urobilinogen, UA: 1 mg/dL (ref 0.0–1.0)

## 2011-12-27 LAB — COMPREHENSIVE METABOLIC PANEL
BUN: 9 mg/dL (ref 6–23)
CO2: 21 mEq/L (ref 19–32)
Chloride: 103 mEq/L (ref 96–112)
Creatinine, Ser: 0.58 mg/dL (ref 0.50–1.10)
GFR calc Af Amer: >90 mL/min (ref >90)
GFR calc non Af Amer: >90 mL/min (ref >90)
Glucose, Bld: 119 mg/dL — ABNORMAL HIGH (ref 70–99)
Total Bilirubin: 0.2 mg/dL — ABNORMAL LOW (ref 0.3–1.2)

## 2011-12-27 LAB — RAPID URINE DRUG SCREEN, HOSP PERFORMED
Barbiturates: NOT DETECTED
Benzodiazepines: NOT DETECTED

## 2011-12-27 LAB — ACETAMINOPHEN LEVEL: Acetaminophen (Tylenol), Serum: <15 ug/mL (ref 10–30)

## 2011-12-27 LAB — ETHANOL: Alcohol, Ethyl (B): <11 mg/dL (ref 0–11)

## 2011-12-27 LAB — CBC
HCT: 40.6 % (ref 36.0–46.0)
MCV: 90.4 fL (ref 78.0–100.0)
RBC: 4.49 MIL/uL (ref 3.87–5.11)
WBC: 5.2 10*3/uL (ref 4.0–10.5)

## 2011-12-27 MED ORDER — ONDANSETRON 4 MG PO TBDP
4.0000 mg | ORAL_TABLET | Freq: Four times a day (QID) | ORAL | Status: DC | PRN
  Administered 2011-12-27: 4 mg via ORAL
  Filled 2011-12-27: qty 1

## 2011-12-27 MED ORDER — PANTOPRAZOLE SODIUM 40 MG PO TBEC
40.0000 mg | DELAYED_RELEASE_TABLET | Freq: Every day | ORAL | Status: DC
  Administered 2011-12-27: 40 mg via ORAL
  Filled 2011-12-27: qty 1

## 2011-12-27 MED ORDER — OXYCODONE HCL 5 MG PO TABS
30.0000 mg | ORAL_TABLET | ORAL | Status: DC | PRN
  Administered 2011-12-27 (×2): 30 mg via ORAL
  Filled 2011-12-27 (×2): qty 6

## 2011-12-27 MED ORDER — LORAZEPAM 1 MG PO TABS
1.0000 mg | ORAL_TABLET | Freq: Three times a day (TID) | ORAL | Status: DC | PRN
  Administered 2011-12-27: 1 mg via ORAL
  Filled 2011-12-27: qty 1

## 2011-12-27 MED ORDER — ACETAMINOPHEN 325 MG PO TABS
650.0000 mg | ORAL_TABLET | Freq: Four times a day (QID) | ORAL | Status: DC | PRN
  Administered 2011-12-30 – 2012-01-01 (×2): 650 mg via ORAL

## 2011-12-27 MED ORDER — MAGNESIUM HYDROXIDE 400 MG/5ML PO SUSP: 30.0000 mL | Freq: Every day | ORAL | Status: DC | PRN

## 2011-12-27 MED ORDER — TRAZODONE HCL 50 MG PO TABS
50.0000 mg | ORAL_TABLET | Freq: Every evening | ORAL | Status: DC | PRN
  Administered 2011-12-28: 50 mg via ORAL
  Filled 2011-12-27: qty 1

## 2011-12-27 NOTE — BH Assessment (Signed)
BHH Assessment Progress Note   Pt has been accepted to [facility] by Dr. Theotis Barrio to Dr. Dub Mikes.  Room 302-1.  Dr. Silverio Lay notified.  Baxter Hire, RN has been notified.  Patient signed supporting paperwork.

## 2011-12-27 NOTE — BHH Counselor (Signed)
Patient has been accepted to [facility] by Dr. Theotis Barrio to the services of Dr. Dub Mikes, bed 302.1.
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
## 2011-12-28 ENCOUNTER — Encounter (HOSPITAL_COMMUNITY): Payer: Self-pay | Admitting: *Deleted

## 2011-12-28 DIAGNOSIS — F1120 Opioid dependence, uncomplicated: Secondary | ICD-10-CM

## 2011-12-28 MED ORDER — OXYCODONE HCL 5 MG PO TABS
30.0000 mg | ORAL_TABLET | Freq: Four times a day (QID) | ORAL | Status: DC | PRN
  Administered 2011-12-28 – 2011-12-30 (×8): 30 mg via ORAL
  Filled 2011-12-28: qty 1
  Filled 2011-12-28 (×2): qty 6
  Filled 2011-12-28: qty 5
  Filled 2011-12-28 (×2): qty 6
  Filled 2011-12-28: qty 1
  Filled 2011-12-28: qty 6
  Filled 2011-12-28: qty 5
  Filled 2011-12-28: qty 6

## 2011-12-28 MED ORDER — FLUOXETINE HCL 10 MG PO CAPS
10.0000 mg | ORAL_CAPSULE | Freq: Every day | ORAL | Status: DC
  Administered 2011-12-28 – 2011-12-30 (×3): 10 mg via ORAL
  Filled 2011-12-28 (×5): qty 1

## 2011-12-28 MED ORDER — PROMETHAZINE HCL 25 MG PO TABS
12.5000 mg | ORAL_TABLET | Freq: Four times a day (QID) | ORAL | Status: DC | PRN
  Administered 2011-12-28 (×2): 12.5 mg via ORAL
  Filled 2011-12-28: qty 1

## 2011-12-28 MED ORDER — MIRTAZAPINE 15 MG PO TABS
15.0000 mg | ORAL_TABLET | Freq: Every day | ORAL | Status: DC
  Administered 2011-12-28: 15 mg via ORAL
  Filled 2011-12-28 (×2): qty 1

## 2011-12-28 MED ORDER — HYDROXYZINE HCL 25 MG PO TABS: 25.0000 mg | ORAL_TABLET | Freq: Four times a day (QID) | ORAL | Status: AC | PRN

## 2011-12-28 MED ORDER — NICOTINE 21 MG/24HR TD PT24
21.0000 mg | MEDICATED_PATCH | Freq: Every day | TRANSDERMAL | Status: DC
  Administered 2011-12-28 – 2012-01-02 (×6): 21 mg via TRANSDERMAL
  Filled 2011-12-28 (×7): qty 1

## 2011-12-28 MED ORDER — CLONIDINE HCL 0.1 MG PO TABS
0.1000 mg | ORAL_TABLET | Freq: Four times a day (QID) | ORAL | Status: DC
  Administered 2011-12-28: 0.1 mg via ORAL
  Filled 2011-12-28 (×7): qty 1

## 2011-12-28 MED ORDER — CLONAZEPAM 1 MG PO TABS
1.0000 mg | ORAL_TABLET | Freq: Every day | ORAL | Status: DC
  Administered 2011-12-28 – 2012-01-01 (×5): 1 mg via ORAL
  Filled 2011-12-28 (×5): qty 1

## 2011-12-28 MED ORDER — DICYCLOMINE HCL 20 MG PO TABS
20.0000 mg | ORAL_TABLET | Freq: Four times a day (QID) | ORAL | Status: AC | PRN
  Administered 2011-12-28: 20 mg via ORAL
  Filled 2011-12-28: qty 1

## 2011-12-28 MED ORDER — NAPROXEN 500 MG PO TABS: 500.0000 mg | ORAL_TABLET | Freq: Two times a day (BID) | ORAL | Status: DC | PRN

## 2011-12-28 MED ORDER — LOPERAMIDE HCL 2 MG PO CAPS: 2.0000 mg | ORAL_CAPSULE | ORAL | Status: AC | PRN

## 2011-12-28 MED ORDER — CLONIDINE HCL 0.1 MG PO TABS
0.1000 mg | ORAL_TABLET | ORAL | Status: DC
  Filled 2011-12-28: qty 1

## 2011-12-28 MED ORDER — CLONIDINE HCL 0.1 MG PO TABS: 0.1000 mg | ORAL_TABLET | Freq: Every day | ORAL | Status: DC

## 2011-12-28 MED ORDER — PANTOPRAZOLE SODIUM 40 MG PO TBEC
40.0000 mg | DELAYED_RELEASE_TABLET | Freq: Every day | ORAL | Status: DC
  Administered 2011-12-28 – 2012-01-02 (×6): 40 mg via ORAL
  Filled 2011-12-28 (×7): qty 1

## 2011-12-28 MED ORDER — METHOCARBAMOL 500 MG PO TABS
500.0000 mg | ORAL_TABLET | Freq: Three times a day (TID) | ORAL | Status: DC | PRN
  Administered 2011-12-28: 500 mg via ORAL
  Filled 2011-12-28: qty 1
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
## 2011-12-28 NOTE — H&P (Signed)
  Pt was seen by me today and I agree with the key elements documented in H&P.  
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
## 2011-12-28 NOTE — Progress Notes (Signed)
The focus of this group is to help patients establish daily goals to achieve during treatment and discuss how the patient can incorporate goal setting into their daily lives to aide in recovery. 12/28/11 0830 Patient did not attend group 

## 2011-12-28 NOTE — [facility] Suicide Risk Assessment (Signed)
Suicide Risk Assessment  Admission Assessment     Nursing information obtained from:  Patient Demographic factors:  Caucasian Current Mental Status:  Suicidal ideation indicated by patient, affect ristricted Loss Factors:  Decline in physical health Historical Factors:  NA Risk Reduction Factors:  Responsible for children under 33 years of age;Sense of responsibility to family;Living with another person, especially a relative  CLINICAL FACTORS:   Depression:   Impulsivity  COGNITIVE FEATURES THAT CONTRIBUTE TO RISK:  Closed-mindedness    SUICIDE RISK:   Mild:  Suicidal ideation of limited frequency, intensity, duration, and specificity.  There are no identifiable plans, no associated intent, mild dysphoria and related symptoms, good self-control (both objective and subjective assessment), few other risk factors, and identifiable protective factors, including available and accessible social support.  PLAN OF CARE:   Assessment:  AXIS I: adjustment d/o with dep mood (2 to pain), r/o mdd AXIS II: Deferred  AXIS III:  Past Medical History   Diagnosis  Date   .  Migraine    .  GERD (gastroesophageal reflux disease)      protonix evenings   .  Endometriosis      chronic pain   .  Anxiety      klonipin for stress disorder   .  Degenerative disc disease     AXIS IV: economic problems and occupational problems  AXIS V: 11-20 some danger of hurting self or others possible OR occasionally fails to maintain minimal personal hygiene OR gross impairment in communication  Treatment Plan/Recommendations:   Re-start Oxycodone prn conformed with her pahramrcy Re-start klonopin 1 mg qhs Labs tsh, lipids tom     Wonda Cerise 12/28/2011, 12:36 PM

## 2011-12-28 NOTE — Clinical Social Work Note (Signed)
[facility] Group Notes: (Clinical Social Work)   12/28/2011 10-11am   Type of Therapy:  Group Therapy   Participation Level:  Did Not Attend    Ambrose Mantle, LCSW 12/28/2011, 11:22 AM

## 2011-12-28 NOTE — Progress Notes (Signed)
D.  Pt pleasant and appropriate on approach.  Left group early due to chronic pain and fatigue.  Denies SI/HI/hallucinations at this time, does continue to be depressed.  Pt wishes to be awoken if she is asleep when she can next get her Oxy IR in order to keep a level amount of pain medication in her system.  Otherwise she states she is in extreme pain when she awakens in the AM.  Interacting appropriately within milieu.  A.  Support and encouragement offered.  Medication given as as ordered.  R.  Will continue to monitor, no acute distress

## 2011-12-28 NOTE — Tx Team (Signed)
Initial Interdisciplinary Treatment Plan  PATIENT STRENGTHS: (choose at least two) Average or above average intelligence Communication skills General fund of knowledge  PATIENT STRESSORS: Health problems Substance abuse   PROBLEM LIST: Problem List/Patient Goals Date to be addressed Date deferred Reason deferred Estimated date of resolution  Depression with suicidal ideation. 12/27/11     Chronic pain 12/27/11     Substance abuse 12/27/11                                          DISCHARGE CRITERIA:  Improved stabilization in mood, thinking, and/or behavior Need for constant or close observation no longer present Reduction of life-threatening or endangering symptoms to within safe limits Verbal commitment to aftercare and medication compliance Withdrawal symptoms are absent or subacute and managed without 24-hour nursing intervention  PRELIMINARY DISCHARGE PLAN: Attend 12-step recovery group Outpatient therapy Return to previous living arrangement  PATIENT/FAMIILY INVOLVEMENT: This treatment plan has been presented to and reviewed with the patient, [name] [name], [name] [name] 12/28/2011, 3:10 AM

## 2011-12-28 NOTE — Progress Notes (Signed)
D did not complete self inventory sheet today, lying in bed asking to see MD b/c of her back hurting, tearful b/c pain, on no meds at this time, will not get up or go to group,

## 2011-12-29 DIAGNOSIS — M549 Dorsalgia, unspecified: Secondary | ICD-10-CM | POA: Diagnosis present

## 2011-12-29 DIAGNOSIS — F1010 Alcohol abuse, uncomplicated: Secondary | ICD-10-CM

## 2011-12-29 DIAGNOSIS — F1120 Opioid dependence, uncomplicated: Secondary | ICD-10-CM | POA: Diagnosis present

## 2011-12-29 DIAGNOSIS — F411 Generalized anxiety disorder: Secondary | ICD-10-CM

## 2011-12-29 DIAGNOSIS — F329 Major depressive disorder, single episode, unspecified: Secondary | ICD-10-CM | POA: Diagnosis present

## 2011-12-29 DIAGNOSIS — F1910 Other psychoactive substance abuse, uncomplicated: Secondary | ICD-10-CM

## 2011-12-29 DIAGNOSIS — F1994 Other psychoactive substance use, unspecified with psychoactive substance-induced mood disorder: Secondary | ICD-10-CM

## 2011-12-29 LAB — TSH: TSH: 2.446 u[IU]/mL (ref 0.350–4.500)

## 2011-12-29 LAB — HEMOGLOBIN A1C: Hgb A1c MFr Bld: 5.4 % (ref <5.7)

## 2011-12-29 LAB — LIPID PANEL
HDL: 28 mg/dL — ABNORMAL LOW (ref >39)
LDL Cholesterol: 123 mg/dL — ABNORMAL HIGH (ref 0–99)
Total CHOL/HDL Ratio: 7.3 RATIO
Triglycerides: 269 mg/dL — ABNORMAL HIGH (ref <150)

## 2011-12-29 MED ORDER — GABAPENTIN 300 MG PO CAPS
300.0000 mg | ORAL_CAPSULE | Freq: Three times a day (TID) | ORAL | Status: DC
  Administered 2011-12-29 – 2012-01-02 (×12): 300 mg via ORAL
  Filled 2011-12-29 (×16): qty 1

## 2011-12-29 MED ORDER — MIRTAZAPINE 30 MG PO TABS
30.0000 mg | ORAL_TABLET | Freq: Every day | ORAL | Status: DC
  Administered 2011-12-29 – 2012-01-01 (×4): 30 mg via ORAL
  Filled 2011-12-29 (×5): qty 1

## 2011-12-29 NOTE — Progress Notes (Signed)
[facility] MD Progress Note  12/29/2011 4:49 PM [name] [name]  MRN:  [id number] Subjective:  Upset as she does not know what is going to happen once she gets out of here without a prescription for opioids, understanding that she will have to wait until the 18 to get a prescription from the pain clinic. She continues to affirm that she did not overuse the pain pills or divert them. She is trying to find ways to get the pain clinic to give her an early prescription. Claims she took the OD as a way of dealing with the pain, numb herself out Diagnosis:   Axis I: Opioid Dependence, Depressive Disorder NOS Axis II: Deferred Axis III:  Past Medical History  Diagnosis Date  . Migraine   . GERD (gastroesophageal reflux disease)     protonix evenings  . Endometriosis     chronic pain  . Anxiety     klonipin for stress disorder  . Degenerative disc disease    Axis IV: problems with access to health care services and problems with primary support group Axis V: 51-60 moderate symptoms  ADL's:  Intact  Sleep: Poor  Appetite:  Fair  Suicidal Ideation:  Plan:  Denies Intent:  Denies Means:  Denies Homicidal Ideation:  Plan:  Denies Intent:  Denies Means:  Denies AEB (as evidenced by):  Psychiatric Specialty Exam: Review of Systems  Constitutional: Negative.   HENT: Negative.   Eyes: Negative.   Respiratory: Negative.   Cardiovascular: Negative.   Gastrointestinal: Negative.   Genitourinary: Negative.   Musculoskeletal: Positive for myalgias and back pain.  Skin: Negative.   Neurological: Negative.   Endo/Heme/Allergies: Negative.   Psychiatric/Behavioral: Positive for depression. The patient is nervous/anxious and has insomnia.     Blood pressure 107/74, pulse 92, temperature 97 F (36.1 C), temperature source Oral, resp. rate 16, height 5' 2.25" (1.581 m), weight 68.947 kg (152 lb), last menstrual period 08/10/2011.Body mass index is 27.58 kg/(m^2).  General Appearance: Fairly  Groomed  Patent attorney::  Fair  Speech:  Clear and Coherent and Slow  Volume:  Decreased  Mood:  Depressed and in pain  Affect:  Depressed and in pain  Thought Process:  Coherent and Goal Directed  Orientation:  Full (Time, Place, and Person)  Thought Content:  Rumination and somatically focused, concerned about being able to get her opioids  Suicidal Thoughts:  No  Homicidal Thoughts:  No  Memory:  Immediate;   Fair Recent;   Fair Remote;   Fair  Judgement:  Fair  Insight:  Present  Psychomotor Activity:  Normal  Concentration:  Fair  Recall:  Fair  Akathisia:  No  Handed:  Right  AIMS (if indicated):     Assets:  Housing Social Support  Sleep:  Number of Hours: 4.25    Current Medications: Current Facility-Administered Medications  Medication Dose Route Frequency Provider Last Rate Last Dose  . acetaminophen (TYLENOL) tablet 650 mg  650 mg Oral Q6H PRN Wonda Cerise, MD      . clonazePAM Scarlette Calico) tablet 1 mg  1 mg Oral QHS Wonda Cerise, MD   1 mg at 12/28/11 2118  . dicyclomine (BENTYL) tablet 20 mg  20 mg Oral Q6H PRN Jorje Guild, PA-C   20 mg at 12/28/11 1726  . FLUoxetine (PROZAC) capsule 10 mg  10 mg Oral Daily Wonda Cerise, MD   10 mg at 12/29/11 0800  . gabapentin (NEURONTIN) capsule 300 mg  300 mg Oral TID Rachael Fee, MD  300 mg at 12/29/11 1134  . hydrOXYzine (ATARAX/VISTARIL) tablet 25 mg  25 mg Oral Q6H PRN Jorje Guild, PA-C      . loperamide (IMODIUM) capsule 2-4 mg  2-4 mg Oral PRN Jorje Guild, PA-C      . magnesium hydroxide (MILK OF MAGNESIA) suspension 30 mL  30 mL Oral Daily PRN Wonda Cerise, MD      . methocarbamol (ROBAXIN) tablet 500 mg  500 mg Oral Q8H PRN Jorje Guild, PA-C   500 mg at 12/28/11 2118  . mirtazapine (REMERON) tablet 30 mg  30 mg Oral QHS Rachael Fee, MD      . nicotine (NICODERM CQ - dosed in mg/24 hours) patch 21 mg  21 mg Transdermal Q0600 Rachael Fee, MD   21 mg at 12/29/11 0533  . oxyCODONE (Oxy IR/ROXICODONE) immediate release tablet 30 mg  30  mg Oral Q6H PRN Wonda Cerise, MD   30 mg at 12/29/11 1134  . pantoprazole (PROTONIX) EC tablet 40 mg  40 mg Oral Daily Wonda Cerise, MD   40 mg at 12/29/11 0800  . promethazine (PHENERGAN) tablet 12.5 mg  12.5 mg Oral Q6H PRN Jorje Guild, PA-C   12.5 mg at 12/28/11 2119  . [DISCONTINUED] mirtazapine (REMERON) tablet 15 mg  15 mg Oral QHS Jorje Guild, PA-C   15 mg at 12/28/11 2118    Lab Results:  Results for orders placed during the hospital encounter of 12/27/11 (from the past 48 hour(s))  TSH     Status: Normal   Collection Time   12/29/11  6:35 AM      Component Value Range Comment   TSH 2.446  0.350 - 4.500 uIU/mL   LIPID PANEL     Status: Abnormal   Collection Time   12/29/11  6:35 AM      Component Value Range Comment   Cholesterol 205 (*) 0 - 200 mg/dL    Triglycerides 161 (*) <150 mg/dL    HDL 28 (*) >09 mg/dL    Total CHOL/HDL Ratio 7.3      VLDL 54 (*) 0 - 40 mg/dL    LDL Cholesterol 604 (*) 0 - 99 mg/dL   HEMOGLOBIN V4U     Status: Normal   Collection Time   12/29/11  6:35 AM      Component Value Range Comment   Hemoglobin A1C 5.4  <5.7 %    Mean Plasma Glucose 108  <117 mg/dL     Physical Findings: AIMS: Facial and Oral Movements Muscles of Facial Expression: None, normal Lips and Perioral Area: None, normal Jaw: None, normal Tongue: None, normal,Extremity Movements Upper (arms, wrists, hands, fingers): None, normal Lower (legs, knees, ankles, toes): None, normal, Trunk Movements Neck, shoulders, hips: None, normal, Overall Severity Severity of abnormal movements (highest score from questions above): None, normal Incapacitation due to abnormal movements: None, normal Patient's awareness of abnormal movements (rate only patient's report): No Awareness, Dental Status Current problems with teeth and/or dentures?: No Does patient usually wear dentures?: No  CIWA:  CIWA-Ar Total: 3  COWS:  COWS Total Score: 4   Treatment Plan Summary: Daily contact with patient to assess  and evaluate symptoms and progress in treatment Medication management  Plan: if she is not going to be able to get the pain clinic an early refill might as well detox her while she is here, and look for other options in treating the pain other than opioids  Medical Decision Making Problem Points:  Established problem, worsening (2), Review of last therapy session (1) and Review of psycho-social stressors (1) Data Points:  Review of medication regiment & side effects (2)  I certify that inpatient services furnished can reasonably be expected to improve the patient's condition.   Fahim Kats A 12/29/2011, 4:49 PM

## 2011-12-29 NOTE — Progress Notes (Signed)
Psychoeducational Group Note  Date:  12/29/2011 Time:  2000   Group Topic/Focus:  AA--Addiction  Participation Level:  Did Not Attend  Participation Quality:    Affect:    Cognitive:    Insight:    Engagement in Group:    Additional Comments:    Humberto Seals Monique 12/29/2011, 10:37 PM

## 2011-12-29 NOTE — Clinical Social Work Note (Signed)
[facility] LCSW Group Therapy  12/29/2011  1:15 PM   Type of Therapy:  Group Therapy  Participation Level:  Did Not Attend group on overcoming obstacles  Kenlyn Lose Horton, LCSWA 12/29/2011 2:58 PM   

## 2011-12-29 NOTE — Progress Notes (Signed)
D slept poorly last nite, appetite is improving, energy level is low and ability to pay attention is good, depressed 8/10 and hopeless 9/10, Si off and on but contracts for safety, taking meds as ordered by MD and going to DR for meals, affect flat and depressed mood this morning, lower back pain and she is depressed about this pain, taking pain meds q6h now instead of q4h per pain clinic, wanting detox off pain meds but afraid she can't handle the detox at this time, going to group and participating A q32min safety checks continue and support offered, states she has the support of her family, encouraged to continue going to group and to interact w/peeers R patient remains safe on the unit

## 2011-12-29 NOTE — Progress Notes (Signed)
Psychoeducational Group Note  Date:  12/29/2011 Time:  1100  Group Topic/Focus:  Self Care:   The focus of this group is to help patients understand the importance of self-care in order to improve or restore emotional, physical, spiritual, interpersonal, and financial health.  Participation Level:  Active  Participation Quality:  Appropriate and Attentive  Affect:  Appropriate  Cognitive:  Appropriate  Insight:  Engaged  Engagement in Group:  Engaged  Additional Comments: Staff explained to the patient that this group is designed to assist in identifying activities that they will incorporate into their daily living with the intention of improving or restoring emotional, physical, spiritual, interpersonal and financial health. The patients were asked to identify one area or skill where they are taking care of themselves. Patients will identify 2-3 activities of self- care that they will use in their daily living after discharge. Patients were encouraged to utilize the skills and techniques taught and apply it in their daily routine.    Ardelle Park O 12/29/2011, 2:59 PM

## 2011-12-29 NOTE — Tx Team (Signed)
Interdisciplinary Treatment Plan Update (Adult)  Date:  12/29/2011  Time Reviewed:  10:48 AM   Progress in Treatment: Attending groups: Yes Participating in groups:  Yes Taking medication as prescribed: Yes Tolerating medication:  Yes Family/Significant othe contact made:  CSW is assessing for appropriate contact Patient understands diagnosis:  Yes Discussing patient identified problems/goals with staff:  Yes Medical problems stabilized or resolved:  Yes Denies suicidal/homicidal ideation: Yes Issues/concerns per patient self-inventory:  None identified Other: N/A  New problem(s) identified: None Identified  Reason for Continuation of Hospitalization: Anxiety Depression Withdrawal Symptoms  Medication stabilization  Interventions implemented related to continuation of hospitalization: mood stabilization, medication monitoring and adjustment, group therapy and psycho education, suicide risk assessment, collateral contact, aftercare planning, ongoing physician assessments and safety checks q 15 mins  Additional comments: N/A  Estimated length of stay: 3-5 days  Discharge Plan: CSW is assessing for appropriate referrals.    New goal(s): N/A  Review of initial/current patient goals per problem list:    1.  Goal(s): Address substance use by completing detox protocol  Met:  No  Target date: 4 days  As evidenced by: Completes clonidine detox protocol on 12/12  2.  Goal (s): Reduce depressive symptoms from a 10 to a 3  Met:  No  Target date: 3-5 days  As evidenced by: Pt rates at a 7-8 today.    3.  Goal (s): Reduce anxiety symptoms from a 10 to a 3  Met:  No  Target date:  3-5 days  As evidenced by: Pt rates at a 6 today.    4.  Goal(s): Eliminate SI  Met:  Yes  Target date:   As evidenced by: pt denying SI.  Pt denies SI.    Attendees: Patient:     Family:     Physician: Geoffery Lyons, MD 12/29/2011 10:48 AM   Nursing: Alease Frame, RN  12/29/2011 10:48  AM   Clinical Social Worker:  Reyes Ivan, LCSWA 12/29/2011  10:48 AM   Other: Robbie Louis, RN 12/29/2011  10:48 AM   Other:  Nanine Means, NP 12/29/2011 10:51 AM   Other:     Other:     Other:      Scribe for Treatment Team:   Reyes Ivan 12/29/2011 10:48 AM
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
## 2011-12-29 NOTE — Progress Notes (Signed)
Pt observed in her room sitting in bed during group time.  Pt states she is in constant pain.  She described to this writer her chronic back pain and that she goes to a pain clinic.  She requests that she be awakened at midnight when she can get another dose of Oxy IR to keep the med in her system so that she will not be in extreme pain in the morning.  She denies SI/HI/AV to this Clinical research associate.  She says she plans to return home at discharge and expects to be discharged by Thursday or Friday. Pt given support/encouragement.  Discussed meds with pt.  Pt voiced understanding.  Pt voiced no other needs/concerns.  Safety maintained with q15 minute checks.

## 2011-12-29 NOTE — Clinical Social Work Note (Signed)
[facility] LCSW Aftercare Discharge Planning Group Note  12/29/2011 8:45 AM  Participation Quality:  Appropriate and Attentive  Affect:  Appropriate, Depressed and Tearful  Cognitive:  Alert and Appropriate  Insight:  Developing/Improving  Engagement in Group:  Developing/Improving  Modes of Intervention:  Clarification, Discussion, Education, Exploration, Orientation, Problem-solving, Rapport Building, Socialization and Support  Summary of Progress/Problems: Pt attended discharge planning group and actively participated in group.  CSW provided pt with today's workbook.  Pt states that she came to the hospital from overdosing on sleep medication.  Pt states that she has pain issues for the past 9 months.  Pt states that the pain has caused her to not be able to work, cook or pick up her kids which makes her depressed.  Pt states that she lives in [place] with her family and has transportation home.  Pt states that she doesn't have transportation home.  Pt doesn't have a psychiatrist or therapist and is open to a referral.  CSW will refer pt to [facility].  No further needs voiced by pt at this time.  Safety planning and suicide prevention discussed.  Pt participated in discussion and acknowledged an understanding of the information provided.      Reyes Ivan, LCSWA 12/29/2011 9:58 AM

## 2011-12-30 MED ORDER — FLUOXETINE HCL 20 MG PO CAPS
20.0000 mg | ORAL_CAPSULE | Freq: Every day | ORAL | Status: DC
  Administered 2011-12-31 – 2012-01-02 (×3): 20 mg via ORAL
  Filled 2011-12-30 (×4): qty 1

## 2011-12-30 MED ORDER — OXYCODONE HCL 5 MG PO TABS
30.0000 mg | ORAL_TABLET | Freq: Four times a day (QID) | ORAL | Status: DC
  Administered 2011-12-30 – 2012-01-02 (×12): 30 mg via ORAL
  Filled 2011-12-30 (×12): qty 6

## 2011-12-30 MED ORDER — GABAPENTIN 300 MG PO CAPS
300.0000 mg | ORAL_CAPSULE | Freq: Every day | ORAL | Status: DC
  Administered 2011-12-30 – 2012-01-01 (×3): 300 mg via ORAL
  Filled 2011-12-30 (×4): qty 1

## 2011-12-30 NOTE — Progress Notes (Addendum)
Psychoeducational Group Note  Date:  12/30/2011 Time:  2000  Group Topic/Focus:  AA meeting   Participation Level:  Did not attend   Participation Quality:    Affect:    Cognitive:    Insight:    Engagement in Group:    Additional Comments:  Pt didn't attend AA meeting this evening.   [name] [name] A 12/30/2011, 9:47 PM

## 2011-12-30 NOTE — Progress Notes (Signed)
Psychoeducational Group Note  Date:  12/30/2011 Time:  1100  Group Topic/Focus:  Recovery Goals:   The focus of this group is to identify appropriate goals for recovery and establish a plan to achieve them.  Participation Level:  None  Additional Comments:  Patient was in group but declined to participate or share.  Ardelle Park O 12/30/2011, 5:21 PM

## 2011-12-30 NOTE — Clinical Social Work Note (Signed)
[facility] LCSW Aftercare Discharge Planning Group Note  12/30/2011 8:45 AM  Participation Quality:  Did Not Attend   Puneet Selden Horton, LCSWA 12/30/2011 9:57 AM   

## 2011-12-30 NOTE — Progress Notes (Signed)
Pt reports she is feeling better.  She reports that the Neurontin is helping along with the Oxy IR.  The pain med was put on scheduled doses today so that the patient would not need to wake up in the middle of the night for a dose.  Pt denies SI tonight with this Clinical research associate.  She also denies HI/AV.  She denies withdrawal symptoms.  She was observed earlier in the game room playing a game with two other patients.  She makes her needs known to staff.  She voices no other needs at this time. Pt plans to return home at discharge.  Safety maintained with q15 minute checks.

## 2011-12-30 NOTE — Progress Notes (Signed)
[facility] Group Notes:  (Counselor/Nursing/MHT/Case Management/Adjunct)  12/30/2011 1:10 PM  Type of Therapy:  Psychoeducational Skills  Participation Level:  Did Not Attend  Summary of Progress/Problems: [name] [name] did not attend a psychoeducational group that focused on using quality time with support systems/indivudals to engage in healthy coping skills.     Wandra Scot 12/30/2011, 1:10 PM

## 2011-12-30 NOTE — Clinical Social Work Note (Signed)
[facility] LCSW Group Therapy  12/30/2011  1:15 PM   Type of Therapy:  Group Therapy  Participation Level:  Active  Participation Quality:  Appropriate and Attentive  Affect:  Appropriate  Cognitive:  Alert and Appropriate  Insight:  Developing/Improving  Engagement in Therapy:  Engaged  Modes of Intervention:  Clarification, Discussion, Education, Exploration, Orientation, Socialization and Support  Summary of Progress/Problems: Patient was attentive and engaged with speaker from [employer].  Patient expressed interest in their programs and services.  Patient processed ways they can relate to the speaker.     Akyra Bouchie Horton, LCSWA 12/30/2011 2:18 PM   

## 2011-12-30 NOTE — Progress Notes (Signed)
[facility] MD Progress Note  12/30/2011 11:56 AM [name] [name]  MRN:  [id number] Subjective: Special states that she is still having a hard time with the pain in her back. She is getting used to taking the medication every 6 hrs rather than 4. Requested to change it to standing schedule (8-12-24-09) as she is having a hard time getting it middle of the night. She is having problems with sleep. She also admits to worsening of the depression. She admits that the words of the MD who told her that her back condition was progressive has gotten her more depressed thinking that she is going to have a hard time walking, playing with the kids. Does say that the Neurontin is helping with the pain.  Diagnosis:   Axis I: Major Depression recurrent, Opioid Dependence Axis II: Deferred Axis III:  Past Medical History  Diagnosis Date  . Migraine   . GERD (gastroesophageal reflux disease)     protonix evenings  . Endometriosis     chronic pain  . Anxiety     klonipin for stress disorder  . Degenerative disc disease    Axis IV: None identified, other than the limitations caused by her pain Axis V: 51-60 moderate symptoms  ADL's:  Intact  Sleep: Poor  Appetite:  Fair  Suicidal Ideation:  Plan:  Denies Intent:  Denies Means:  Denies Homicidal Ideation:  Plan:  Denies Intent:  Denies Means:  Denies AEB (as evidenced by):  Psychiatric Specialty Exam: Review of Systems  Constitutional: Negative.   HENT: Negative.   Eyes: Negative.   Respiratory: Negative.   Cardiovascular: Negative.   Gastrointestinal: Negative.   Genitourinary: Negative.   Musculoskeletal: Positive for back pain.  Skin: Negative.   Neurological: Negative.   Endo/Heme/Allergies: Negative.   Psychiatric/Behavioral: Positive for depression. The patient is nervous/anxious and has insomnia.     Blood pressure 107/74, pulse 88, temperature 97 F (36.1 C), temperature source Oral, resp. rate 16, height 5' 2.25" (1.581 m), weight  68.947 kg (152 lb), last menstrual period 08/10/2011.Body mass index is 27.58 kg/(m^2).  General Appearance: Fairly Groomed  Patent attorney::  Fair  Speech:  Clear and Coherent and Slow  Volume:  Decreased  Mood:  Depressed and in pain  Affect:  Restricted  Thought Process:  Coherent and Goal Directed  Orientation:  Full (Time, Place, and Person)  Thought Content:  worries, concerns, grieving, anticipating the loss of ambulation  Suicidal Thoughts:  No  Homicidal Thoughts:  No  Memory:  Immediate;   Fair Recent;   Fair Remote;   Fair  Judgement:  Intact  Insight:  Present  Psychomotor Activity:  Normal  Concentration:  Fair  Recall:  Fair  Akathisia:  No  Handed:  Right  AIMS (if indicated):     Assets:  Desire for Improvement Housing  Sleep:  Number of Hours: 5    Current Medications: Current Facility-Administered Medications  Medication Dose Route Frequency Provider Last Rate Last Dose  . acetaminophen (TYLENOL) tablet 650 mg  650 mg Oral Q6H PRN Wonda Cerise, MD      . clonazePAM Scarlette Calico) tablet 1 mg  1 mg Oral QHS Wonda Cerise, MD   1 mg at 12/29/11 2159  . dicyclomine (BENTYL) tablet 20 mg  20 mg Oral Q6H PRN Jorje Guild, PA-C   20 mg at 12/28/11 1726  . FLUoxetine (PROZAC) capsule 20 mg  20 mg Oral Daily Rachael Fee, MD      . gabapentin (NEURONTIN) capsule  300 mg  300 mg Oral TID Rachael Fee, MD   300 mg at 12/30/11 1145  . gabapentin (NEURONTIN) capsule 300 mg  300 mg Oral QHS Rachael Fee, MD      . hydrOXYzine (ATARAX/VISTARIL) tablet 25 mg  25 mg Oral Q6H PRN Jorje Guild, PA-C      . loperamide (IMODIUM) capsule 2-4 mg  2-4 mg Oral PRN Jorje Guild, PA-C      . magnesium hydroxide (MILK OF MAGNESIA) suspension 30 mL  30 mL Oral Daily PRN Wonda Cerise, MD      . methocarbamol (ROBAXIN) tablet 500 mg  500 mg Oral Q8H PRN Jorje Guild, PA-C   500 mg at 12/28/11 2118  . mirtazapine (REMERON) tablet 30 mg  30 mg Oral QHS Rachael Fee, MD   30 mg at 12/29/11 2159  . nicotine  (NICODERM CQ - dosed in mg/24 hours) patch 21 mg  21 mg Transdermal Q0600 Rachael Fee, MD   21 mg at 12/30/11 0618  . oxyCODONE (Oxy IR/ROXICODONE) immediate release tablet 30 mg  30 mg Oral QID Rachael Fee, MD      . pantoprazole (PROTONIX) EC tablet 40 mg  40 mg Oral Daily Wonda Cerise, MD   40 mg at 12/30/11 0809  . promethazine (PHENERGAN) tablet 12.5 mg  12.5 mg Oral Q6H PRN Jorje Guild, PA-C   12.5 mg at 12/28/11 2119  . [DISCONTINUED] FLUoxetine (PROZAC) capsule 10 mg  10 mg Oral Daily Wonda Cerise, MD   10 mg at 12/30/11 0809  . [DISCONTINUED] oxyCODONE (Oxy IR/ROXICODONE) immediate release tablet 30 mg  30 mg Oral Q6H PRN Wonda Cerise, MD   30 mg at 12/30/11 7829    Lab Results:  Results for orders placed during the hospital encounter of 12/27/11 (from the past 48 hour(s))  TSH     Status: Normal   Collection Time   12/29/11  6:35 AM      Component Value Range Comment   TSH 2.446  0.350 - 4.500 uIU/mL   LIPID PANEL     Status: Abnormal   Collection Time   12/29/11  6:35 AM      Component Value Range Comment   Cholesterol 205 (*) 0 - 200 mg/dL    Triglycerides 562 (*) <150 mg/dL    HDL 28 (*) >13 mg/dL    Total CHOL/HDL Ratio 7.3      VLDL 54 (*) 0 - 40 mg/dL    LDL Cholesterol 086 (*) 0 - 99 mg/dL   HEMOGLOBIN V7Q     Status: Normal   Collection Time   12/29/11  6:35 AM      Component Value Range Comment   Hemoglobin A1C 5.4  <5.7 %    Mean Plasma Glucose 108  <117 mg/dL     Physical Findings: AIMS: Facial and Oral Movements Muscles of Facial Expression: None, normal Lips and Perioral Area: None, normal Jaw: None, normal Tongue: None, normal,Extremity Movements Upper (arms, wrists, hands, fingers): None, normal Lower (legs, knees, ankles, toes): None, normal, Trunk Movements Neck, shoulders, hips: None, normal, Overall Severity Severity of abnormal movements (highest score from questions above): None, normal Incapacitation due to abnormal movements: None,  normal Patient's awareness of abnormal movements (rate only patient's report): No Awareness, Dental Status Current problems with teeth and/or dentures?: No Does patient usually wear dentures?: No  CIWA:  CIWA-Ar Total: 1  COWS:  COWS Total Score: 4   Treatment Plan Summary: Daily  contact with patient to assess and evaluate symptoms and progress in treatment Medication management  Plan: Supportive approach/coping skills           Will increase the Prozac to 20 mg           Will add a Neurontin 300 mg HS           Change the times she gets the opioids   Medical Decision Making Problem Points:  Established problem, worsening (2), Review of last therapy session (1) and Review of psycho-social stressors (1) Data Points:  Review of medication regiment & side effects (2) Review of new medications or change in dosage (2)  I certify that inpatient services furnished can reasonably be expected to improve the patient's condition.   [name] [name] A 12/30/2011, 11:56 AM

## 2011-12-30 NOTE — Progress Notes (Signed)
Patient ID: [name] [name], female   DOB: 22-Dec-1980, 31 y.o.   MRN: [id number] She has been up and to part of the groups and in bed most of the time. She had requested and received prn pain medication then order was change to get it at a scheduled time.  Self inventory: depression and hopeless at 9. Denies w/d symptoms, Has SI on and off,and and ongoing pain of back and hips.

## 2011-12-31 NOTE — Progress Notes (Signed)
Pt reports she continues to do better each day.  She says the neurontin is still helpful with her pain level and also having the Oxy scheduled helped her get a good night sleep.  She says she will probably discharge Friday or Saturday because her mother-in-law is out of town and that is who she live with .  She denies SI/HI/AV.  She makes her needs known to staff.  Support/encouragement given.  Safety maintained with q15 minute checks.

## 2011-12-31 NOTE — Progress Notes (Signed)
Patient ID: [name] [name], female   DOB: 1980/04/23, 31 y.o.   MRN: [id number] She has been up and to groups interacting with peers and attending groups. Self inventory: Depression 8, hopeless 7, denies withdrawal symptoms and  SI thoughts. She says that she is feeeling better this afternoon and that she may go home tomorrow.

## 2011-12-31 NOTE — Progress Notes (Signed)
Psychoeducational Group Note  Date:  12/31/2011 Time:  2000  Group Topic/Focus:  NA group  Participation Level:  Active  Participation Quality:  Appropriate  Affect:    Cognitive:  Appropriate  Insight:  Engaged  Engagement in Group:  Engaged  Additional CommentsFlonnie Hailstone 12/31/2011, 9:37 PM

## 2011-12-31 NOTE — Clinical Social Work Note (Signed)
[facility] LCSW Group Therapy  12/31/2011  1:15 PM   Type of Therapy:  Group Therapy  Participation Level:  Active  Participation Quality:  Appropriate and Attentive  Affect:  Appropriate and Depressed  Cognitive:  Alert and Appropriate  Insight:  Developing/Improving  Engagement in Therapy:  Developing/Improving  Modes of Intervention:  Clarification, Discussion, Exploration, Problem-solving, Rapport Building, Socialization and Support  Summary of Progress/Problems: The topic for group today was emotional regulation.  Pt participated in the discussion regarding what emotional regulation is and how it affects their life, positive and negative.  Pt discussed coping skills and ways they can regulate their emotions in a positive manner.     Reha Martinovich Horton, LCSWA 12/31/2011 2:36 PM   

## 2011-12-31 NOTE — Clinical Social Work Note (Signed)
[facility] LCSW Aftercare Discharge Planning Group Note  12/31/2011 8:45 AM  Participation Quality:  Did Not Attend  Lanika Colgate Horton, LCSWA 12/31/2011 9:42 AM   

## 2011-12-31 NOTE — Progress Notes (Signed)
Pt did not attend evening NA group.  

## 2011-12-31 NOTE — Clinical Social Work Note (Signed)
Interdisciplinary Treatment Plan Update (Adult)  Date:  12/31/2011  Time Reviewed:  9:45 AM   Progress in Treatment: Attending groups: Yes Participating in groups:  Yes Taking medication as prescribed: Yes Tolerating medication:  Yes Family/Significant othe contact made:  CSW will assess for appropriate contact Patient understands diagnosis:  Yes Discussing patient identified problems/goals with staff:  Yes Medical problems stabilized or resolved:  Yes Denies suicidal/homicidal ideation: Yes Issues/concerns per patient self-inventory:  None identified Other: N/A  New problem(s) identified: None Identified  Reason for Continuation of Hospitalization: Anxiety Depression Medication stabilization Withdrawal symptoms  Interventions implemented related to continuation of hospitalization: mood stabilization, medication monitoring and adjustment, group therapy and psycho education, suicide risk assessment, collateral contact, aftercare planning, ongoing physician assessments and safety checks q 15 mins  Additional comments: N/A  Estimated length of stay: 2 days - d/c on Friday  Discharge Plan: Pt will follow up at [facility] for medication management and therapy.    New goal(s): N/A  Review of initial/current patient goals per problem list:    1.  Goal(s): Address substance use by completing detox protocol  Met:  No  Target date: 4 days  As evidenced by: 12/12  2.  Goal (s): Reduce depressive symptoms from a 10 to a 3  Met:  No  Target date: 3-5 days  As evidenced by: Pt rates at a   3.  Goal (s): Reduce anxiety symptoms from a 10 to a 3  Met:  No  Target date:  3-5 days  As evidenced by: Pt rates at a     Attendees: Patient:     Family:     Physician: Geoffery Lyons, MD 12/31/2011 9:45 AM   Nursing: Roswell Miners, RN 12/31/2011 9:45 AM   Clinical Social Worker:  Reyes Ivan, LCSWA 12/31/2011  9:45 AM   Other: Berneice Heinrich, RN 12/31/2011  9:45 AM   Other:   Nanine Means, RN 12/31/2011 9:47 AM   Other:  Verna Czech, LCSW 12/31/2011 9:48 AM   Other:  Olivia Mackie, Psyc intern 12/31/2011 9:48 AM   Other:      Scribe for Treatment Team:   Reyes Ivan 12/31/2011 9:45 AM

## 2011-12-31 NOTE — Progress Notes (Signed)
[name] MD Progress Note  12/31/2011 3:35 PM [name] [name]  MRN:  [id number] Subjective:  [name] [name] endorses that the Neurontin seems to be working. She slept better She got an appointment on Monday to the pain clinic. She says she can deal with withdrawal during the weekend until she gets her appointment. Has used clonidine and it has helped in the past. She is still having a hard time dealing with what the physician told her in terms of her bad prognosis with her back condition. She is trying to focus on the fact that today she can still play and take care of her kids. She plans to focus on that.  Diagnosis:   Axis I: Major Depression recurrent, opioid dependence Axis II: Deferred Axis III:  Past Medical History  Diagnosis Date  . Migraine   . GERD (gastroesophageal reflux disease)     protonix evenings  . Endometriosis     chronic pain  . Anxiety     klonipin for stress disorder  . Degenerative disc disease    Axis IV: stress from her back condition Axis V: 51-60 moderate symptoms  ADL's:  Intact  Sleep: Fair  Appetite:  Fair  Suicidal Ideation:  Plan:  Dnies Intent:  Denies Means:  Denies Homicidal Ideation:  Plan:  Denies Intent:  Denies Means:  Denies AEB (as evidenced by):  Psychiatric Specialty Exam: Review of Systems  Constitutional: Negative.   HENT: Negative.   Eyes: Negative.   Respiratory: Negative.   Cardiovascular: Negative.   Gastrointestinal: Negative.   Genitourinary: Negative.   Musculoskeletal: Positive for back pain.  Skin: Negative.   Neurological: Negative.   Endo/Heme/Allergies: Negative.   Psychiatric/Behavioral: Positive for depression.    Blood pressure 102/71, pulse 87, temperature 98.1 F (36.7 C), temperature source Oral, resp. rate 16, height 5' 2.25" (1.581 m), weight 68.947 kg (152 lb), last menstrual period 08/10/2011.Body mass index is 27.58 kg/(m^2).  General Appearance: Fairly Groomed  Patent attorney::  Fair  Speech:  Clear and  Coherent and Slow  Volume:  Decreased  Mood:  worried, in pain  Affect:  Restricted  Thought Process:  Coherent and Goal Directed  Orientation:  Full (Time, Place, and Person)  Thought Content:  Rumination and worries about her back and possible progression of the condition  Suicidal Thoughts:  No  Homicidal Thoughts:  No  Memory:  Immediate;   Fair Recent;   Fair Remote;   Fair  Judgement:  Intact  Insight:  Present  Psychomotor Activity:  Normal  Concentration:  Fair  Recall:  Fair  Akathisia:  No  Handed:  Right  AIMS (if indicated):     Assets:  Desire for Improvement Housing Social Support  Sleep:  Number of Hours: 5.25    Current Medications: Current Facility-Administered Medications  Medication Dose Route Frequency Provider Last Rate Last Dose  . acetaminophen (TYLENOL) tablet 650 mg  650 mg Oral Q6H PRN Wonda Cerise, MD   650 mg at 12/30/11 1626  . clonazePAM (KLONOPIN) tablet 1 mg  1 mg Oral QHS Wonda Cerise, MD   1 mg at 12/30/11 2224  . dicyclomine (BENTYL) tablet 20 mg  20 mg Oral Q6H PRN Jorje Guild, PA-C   20 mg at 12/28/11 1726  . FLUoxetine (PROZAC) capsule 20 mg  20 mg Oral Daily Rachael Fee, MD   20 mg at 12/31/11 4098  . gabapentin (NEURONTIN) capsule 300 mg  300 mg Oral TID Rachael Fee, MD   300 mg at  12/31/11 1157  . gabapentin (NEURONTIN) capsule 300 mg  300 mg Oral QHS Rachael Fee, MD   300 mg at 12/30/11 2224  . hydrOXYzine (ATARAX/VISTARIL) tablet 25 mg  25 mg Oral Q6H PRN Jorje Guild, PA-C      . loperamide (IMODIUM) capsule 2-4 mg  2-4 mg Oral PRN Jorje Guild, PA-C      . magnesium hydroxide (MILK OF MAGNESIA) suspension 30 mL  30 mL Oral Daily PRN Wonda Cerise, MD      . methocarbamol (ROBAXIN) tablet 500 mg  500 mg Oral Q8H PRN Jorje Guild, PA-C   500 mg at 12/28/11 2118  . mirtazapine (REMERON) tablet 30 mg  30 mg Oral QHS Rachael Fee, MD   30 mg at 12/30/11 2224  . nicotine (NICODERM CQ - dosed in mg/24 hours) patch 21 mg  21 mg Transdermal Q0600 Rachael Fee, MD   21 mg at 12/31/11 0617  . oxyCODONE (Oxy IR/ROXICODONE) immediate release tablet 30 mg  30 mg Oral QID Rachael Fee, MD   30 mg at 12/31/11 1157  . pantoprazole (PROTONIX) EC tablet 40 mg  40 mg Oral Daily Wonda Cerise, MD   40 mg at 12/31/11 5784  . promethazine (PHENERGAN) tablet 12.5 mg  12.5 mg Oral Q6H PRN Jorje Guild, PA-C   12.5 mg at 12/28/11 2119    Lab Results: No results found for this or any previous visit (from the past 48 hour(s)).  Physical Findings: AIMS: Facial and Oral Movements Muscles of Facial Expression: None, normal Lips and Perioral Area: None, normal Jaw: None, normal Tongue: None, normal,Extremity Movements Upper (arms, wrists, hands, fingers): None, normal Lower (legs, knees, ankles, toes): None, normal, Trunk Movements Neck, shoulders, hips: None, normal, Overall Severity Severity of abnormal movements (highest score from questions above): None, normal Incapacitation due to abnormal movements: None, normal Patient's awareness of abnormal movements (rate only patient's report): No Awareness, Dental Status Current problems with teeth and/or dentures?: No Does patient usually wear dentures?: No  CIWA:  CIWA-Ar Total: 3  COWS:  COWS Total Score: 4   Treatment Plan Summary: Daily contact with patient to assess and evaluate symptoms and progress in treatment Medication management  Plan: Supportive approach/coping skills/stress management  Medical Decision Making Problem Points:  Established problem, stable/improving (1), Review of last therapy session (1) and Review of psycho-social stressors (1) Data Points:  Review of medication regiment & side effects (2)  I certify that inpatient services furnished can reasonably be expected to improve the patient's condition.   Jian Hodgman A 12/31/2011, 3:35 PM

## 2012-01-01 MED ORDER — ARIPIPRAZOLE 2 MG PO TABS
2.0000 mg | ORAL_TABLET | Freq: Every day | ORAL | Status: DC
  Administered 2012-01-01 – 2012-01-02 (×2): 2 mg via ORAL
  Filled 2012-01-01 (×2): qty 1

## 2012-01-01 NOTE — Progress Notes (Signed)
[facility] INPATIENT:  Family/Significant Other Suicide Prevention Education  Suicide Prevention Education:  Education Completed; Tanice Petre - husband [phone]),  (name of family member/significant other) has been identified by the patient as the family member/significant other with whom the patient will be residing, and identified as the person(s) who will aid the patient in the event of a mental health crisis (suicidal ideations/suicide attempt).  With written consent from the patient, the family member/significant other has been provided the following suicide prevention education, prior to the and/or following the discharge of the patient.  The suicide prevention education provided includes the following:  Suicide risk factors  Suicide prevention and interventions  National Suicide Hotline telephone number  [facility] assessment telephone number  [facility] Emergency Assistance 911  [facility] and/or Residential Mobile Crisis Unit telephone number  Request made of family/significant other to:  Remove weapons (e.g., guns, rifles, knives), all items previously/currently identified as safety concern.    Remove drugs/medications (over-the-counter, prescriptions, illicit drugs), all items previously/currently identified as a safety concern.  The family member/significant other verbalizes understanding of the suicide prevention education information provided.  The family member/significant other agrees to remove the items of safety concern listed above.  Carmina Miller 01/01/2012, 12:46 PM

## 2012-01-01 NOTE — Progress Notes (Signed)
Patient did attend the evening karaoke group.  

## 2012-01-01 NOTE — Progress Notes (Signed)
[facility] MD Progress Note  01/01/2012 11:59 AM Aleli Navedo  MRN:  [id number] Subjective:  [name] [name] is having a very hard time. She did not sleep last night. She is having worsening of her depression. She states that she has been crying all day. Cant quit thinking about what can happen if the back condition was to progress as her doctor told her. She states she thinks she is grieving. She had used Seroquel in the past successfully, but she gained weight on it. She would like to have another option to help her with the ruminations Diagnosis:   Axis I: Major Depression, recurrent Axis II: Deferred Axis III:  Past Medical History  Diagnosis Date  . Migraine   . GERD (gastroesophageal reflux disease)     protonix evenings  . Endometriosis     chronic pain  . Anxiety     klonipin for stress disorder  . Degenerative disc disease    Axis IV: Reaction to her medical conditions Axis V: 51-60 moderate symptoms  ADL's:  Intact  Sleep: Poor  Appetite:  Poor  Suicidal Ideation:  Plan:  fleeting thoughts anticiapting being incapacitated, no plans Intent:  Denies Means:  denies Homicidal Ideation:  Plan:  Denies Intent:  Denies Means:  Denies AEB (as evidenced by):  Psychiatric Specialty Exam: Review of Systems  Constitutional: Negative.   HENT: Negative.   Eyes: Negative.   Respiratory: Negative.   Cardiovascular: Negative.   Gastrointestinal: Negative.   Genitourinary: Negative.   Musculoskeletal: Positive for back pain.  Skin: Negative.   Neurological: Negative.   Endo/Heme/Allergies: Negative.   Psychiatric/Behavioral: Positive for depression. The patient is nervous/anxious and has insomnia.     Blood pressure 106/76, pulse 99, temperature 97.6 F (36.4 C), temperature source Oral, resp. rate 16, height 5' 2.25" (1.581 m), weight 68.947 kg (152 lb), last menstrual period 08/10/2011.Body mass index is 27.58 kg/(m^2).  General Appearance: Disheveled  Eye Contact::  Minimal   Speech:  Clear and Coherent and Slow  Volume:  Decreased  Mood:  Anxious, Depressed and worried  Affect:  Tearful  Thought Process:  Coherent and Goal Directed  Orientation:  Full (Time, Place, and Person)  Thought Content:  Rumination  Suicidal Thoughts:  No  Homicidal Thoughts:  No  Memory:  Immediate;   Fair Recent;   Fair Remote;   Fair  Judgement:  Fair  Insight:  Present  Psychomotor Activity:  Restlessness  Concentration:  Fair  Recall:  Fair  Akathisia:  No  Handed:  Right  AIMS (if indicated):     Assets:  Desire for Improvement Housing Social Support  Sleep:  Number of Hours: 6.25    Current Medications: Current Facility-Administered Medications  Medication Dose Route Frequency Provider Last Rate Last Dose  . acetaminophen (TYLENOL) tablet 650 mg  650 mg Oral Q6H PRN Wonda Cerise, MD   650 mg at 12/30/11 1626  . ARIPiprazole (ABILIFY) tablet 2 mg  2 mg Oral Daily Rachael Fee, MD   2 mg at 01/01/12 1116  . clonazePAM (KLONOPIN) tablet 1 mg  1 mg Oral QHS Wonda Cerise, MD   1 mg at 12/31/11 2206  . dicyclomine (BENTYL) tablet 20 mg  20 mg Oral Q6H PRN Jorje Guild, PA-C   20 mg at 12/28/11 1726  . FLUoxetine (PROZAC) capsule 20 mg  20 mg Oral Daily Rachael Fee, MD   20 mg at 01/01/12 0800  . gabapentin (NEURONTIN) capsule 300 mg  300 mg Oral TID Madie Reno  A Shayana Hornstein, MD   300 mg at 01/01/12 0800  . gabapentin (NEURONTIN) capsule 300 mg  300 mg Oral QHS Rachael Fee, MD   300 mg at 12/31/11 2206  . hydrOXYzine (ATARAX/VISTARIL) tablet 25 mg  25 mg Oral Q6H PRN Jorje Guild, PA-C      . loperamide (IMODIUM) capsule 2-4 mg  2-4 mg Oral PRN Jorje Guild, PA-C      . magnesium hydroxide (MILK OF MAGNESIA) suspension 30 mL  30 mL Oral Daily PRN Wonda Cerise, MD      . methocarbamol (ROBAXIN) tablet 500 mg  500 mg Oral Q8H PRN Jorje Guild, PA-C   500 mg at 12/28/11 2118  . mirtazapine (REMERON) tablet 30 mg  30 mg Oral QHS Rachael Fee, MD   30 mg at 12/31/11 2206  . nicotine (NICODERM CQ -  dosed in mg/24 hours) patch 21 mg  21 mg Transdermal Q0600 Rachael Fee, MD   21 mg at 01/01/12 0616  . oxyCODONE (Oxy IR/ROXICODONE) immediate release tablet 30 mg  30 mg Oral QID Rachael Fee, MD   30 mg at 01/01/12 0800  . pantoprazole (PROTONIX) EC tablet 40 mg  40 mg Oral Daily Wonda Cerise, MD   40 mg at 01/01/12 0800  . promethazine (PHENERGAN) tablet 12.5 mg  12.5 mg Oral Q6H PRN Jorje Guild, PA-C   12.5 mg at 12/28/11 2119    Lab Results: No results found for this or any previous visit (from the past 48 hour(s)).  Physical Findings: AIMS: Facial and Oral Movements Muscles of Facial Expression: None, normal Lips and Perioral Area: None, normal Jaw: None, normal Tongue: None, normal,Extremity Movements Upper (arms, wrists, hands, fingers): None, normal Lower (legs, knees, ankles, toes): None, normal, Trunk Movements Neck, shoulders, hips: None, normal, Overall Severity Severity of abnormal movements (highest score from questions above): None, normal Incapacitation due to abnormal movements: None, normal Patient's awareness of abnormal movements (rate only patient's report): No Awareness, Dental Status Current problems with teeth and/or dentures?: No Does patient usually wear dentures?: No  CIWA:  CIWA-Ar Total: 0  COWS:  COWS Total Score: 4   Treatment Plan Summary: Daily contact with patient to assess and evaluate symptoms and progress in treatment Medication management  Plan: Supportive approach/coping skills/CBT: identify distortions:catastrophizing           Trial with Abilify 2 mg daily Medical Decision Making Problem Points:  Established problem, worsening (2), Review of last therapy session (1) and Review of psycho-social stressors (1) Data Points:  Review of medication regiment & side effects (2) Review of new medications or change in dosage (2)  I certify that inpatient services furnished can reasonably be expected to improve the patient's condition.   Dejay Kronk  A 01/01/2012, 11:59 AM

## 2012-01-01 NOTE — Clinical Social Work Note (Signed)
[facility] LCSW Group Therapy  01/01/2012  1:15 PM   Type of Therapy:  Group Therapy  Participation Level:  Active  Participation Quality:  Appropriate and Attentive  Affect:  Appropriate  Cognitive:  Alert and Appropriate  Insight:  Developing/Improving  Engagement in Therapy:  Developing/Improving  Modes of Intervention:  Discussion, Education, Exploration, Limit-setting, Role-play, Socialization and Support  Summary of Progress/Problems: The topic for group was balance in life.  Pt participated in the discussion about when their life was in balance and out of balance and how this feels.  Pt discussed ways to get back in balance and short term goals they can work on to get where they want to be. Pt discussed accepting her problems and moving forward by trying to be the best mom she can be with the physical limitations she now has.    [name] [name], LCSWA 01/01/2012 2:14 PM

## 2012-01-01 NOTE — Progress Notes (Signed)
Patient ID: [name] [name], female   DOB: 1980-12-06, 31 y.o.   MRN: [id number] She has not attended groups and has had poor interactions with peers. She has been tearful stated "I don't know why I am crying". She has a new order  For abitify   that is to start today. Self inventory: Depression 9, hopelessness 9, SI thoughts off and on (contracts for safety).  Denies withdrawal symptoms.

## 2012-01-01 NOTE — Clinical Social Work Note (Signed)
[facility] LCSW Aftercare Discharge Planning Group Note  01/01/2012 8:45 AM  Participation Quality:  Appropriate and Attentive  Affect:  Appropriate  Cognitive:  Alert and Appropriate  Insight:  Developing/Improving  Engagement in Group:  Engaged  Modes of Intervention:  Clarification, Discussion, Education, Exploration, Orientation, Problem-solving, Rapport Building, Socialization and Support  Summary of Progress/Problems: Pt attended discharge planning group and actively participated in group.  CSW provided pt with today's workbook.  Pt presents with flat affect and depressed mood.  Pt rates depression and anxiety at an 8 today.  Pt denies SI/HI.  Pt has follow up scheduled at [facility] for medication management and therapy.  No further needs voiced by pt at this time.  Safety planning and suicide prevention discussed.  Pt participated in discussion and acknowledged an understanding of the information provided.       Reyes Ivan, LCSWA 01/01/2012 9:31 AM

## 2012-01-02 MED ORDER — CHLORDIAZEPOXIDE HCL 25 MG PO CAPS: 25.0000 mg | ORAL_CAPSULE | Freq: Every day | ORAL | Status: DC

## 2012-01-02 MED ORDER — ARIPIPRAZOLE 2 MG PO TABS
2.0000 mg | ORAL_TABLET | Freq: Two times a day (BID) | ORAL | Status: DC
  Administered 2012-01-02 – 2012-01-05 (×6): 2 mg via ORAL
  Filled 2012-01-02 (×12): qty 1

## 2012-01-02 MED ORDER — HYDROXYZINE HCL 25 MG PO TABS
25.0000 mg | ORAL_TABLET | Freq: Four times a day (QID) | ORAL | Status: DC | PRN
Start: 2012-01-02 — End: 2012-01-05

## 2012-01-02 MED ORDER — HYDROXYZINE HCL 25 MG PO TABS: 25.0000 mg | ORAL_TABLET | Freq: Four times a day (QID) | ORAL | Status: DC | PRN

## 2012-01-02 MED ORDER — GABAPENTIN 300 MG PO CAPS
300.0000 mg | ORAL_CAPSULE | Freq: Three times a day (TID) | ORAL | Status: DC
  Filled 2012-01-02 (×3): qty 1

## 2012-01-02 MED ORDER — METHOCARBAMOL 500 MG PO TABS
500.0000 mg | ORAL_TABLET | Freq: Three times a day (TID) | ORAL | Status: DC | PRN
Start: 2012-01-02 — End: 2012-01-05
  Administered 2012-01-04 (×2): 500 mg via ORAL
  Filled 2012-01-02 (×2): qty 1

## 2012-01-02 MED ORDER — PANTOPRAZOLE SODIUM 40 MG PO TBEC
40.0000 mg | DELAYED_RELEASE_TABLET | Freq: Every day | ORAL | Status: DC
  Administered 2012-01-03 – 2012-01-05 (×3): 40 mg via ORAL
  Filled 2012-01-02 (×5): qty 1

## 2012-01-02 MED ORDER — CHLORDIAZEPOXIDE HCL 25 MG PO CAPS: 25.0000 mg | ORAL_CAPSULE | Freq: Four times a day (QID) | ORAL | Status: DC | PRN

## 2012-01-02 MED ORDER — OXYCODONE HCL 5 MG PO TABS
30.0000 mg | ORAL_TABLET | Freq: Four times a day (QID) | ORAL | Status: DC
  Administered 2012-01-02 – 2012-01-05 (×14): 30 mg via ORAL
  Filled 2012-01-02 (×13): qty 6
  Filled 2012-01-02: qty 1
  Filled 2012-01-02: qty 6
  Filled 2012-01-02: qty 5

## 2012-01-02 MED ORDER — ARIPIPRAZOLE 2 MG PO TABS
2.0000 mg | ORAL_TABLET | Freq: Every day | ORAL | Status: DC
  Filled 2012-01-02 (×2): qty 1

## 2012-01-02 MED ORDER — NICOTINE 21 MG/24HR TD PT24
21.0000 mg | MEDICATED_PATCH | Freq: Every day | TRANSDERMAL | Status: DC
  Administered 2012-01-03 – 2012-01-05 (×3): 21 mg via TRANSDERMAL
  Filled 2012-01-02 (×3): qty 1
  Filled 2012-01-02: qty 14
  Filled 2012-01-02: qty 1

## 2012-01-02 MED ORDER — FLUOXETINE HCL 20 MG PO CAPS
20.0000 mg | ORAL_CAPSULE | Freq: Every day | ORAL | Status: DC
  Filled 2012-01-02 (×2): qty 1

## 2012-01-02 MED ORDER — GABAPENTIN 300 MG PO CAPS
300.0000 mg | ORAL_CAPSULE | Freq: Four times a day (QID) | ORAL | Status: DC
  Administered 2012-01-02 – 2012-01-05 (×13): 300 mg via ORAL
  Filled 2012-01-02 (×22): qty 1

## 2012-01-02 MED ORDER — CHLORDIAZEPOXIDE HCL 25 MG PO CAPS: 25.0000 mg | ORAL_CAPSULE | Freq: Once | ORAL | Status: DC

## 2012-01-02 MED ORDER — MIRTAZAPINE 30 MG PO TABS
30.0000 mg | ORAL_TABLET | Freq: Every day | ORAL | Status: DC
  Filled 2012-01-02 (×2): qty 1

## 2012-01-02 MED ORDER — LOPERAMIDE HCL 2 MG PO CAPS: 2.0000 mg | ORAL_CAPSULE | ORAL | Status: DC | PRN

## 2012-01-02 MED ORDER — CHLORDIAZEPOXIDE HCL 25 MG PO CAPS: 25.0000 mg | ORAL_CAPSULE | Freq: Three times a day (TID) | ORAL | Status: DC

## 2012-01-02 MED ORDER — VITAMIN B-1 100 MG PO TABS: 100.0000 mg | ORAL_TABLET | Freq: Every day | ORAL | Status: DC

## 2012-01-02 MED ORDER — PROMETHAZINE HCL 25 MG PO TABS: 12.5000 mg | ORAL_TABLET | Freq: Four times a day (QID) | ORAL | Status: DC | PRN

## 2012-01-02 MED ORDER — ONDANSETRON 4 MG PO TBDP: 4.0000 mg | ORAL_TABLET | Freq: Four times a day (QID) | ORAL | Status: DC | PRN

## 2012-01-02 MED ORDER — OXYCODONE HCL 5 MG PO TABS
15.0000 mg | ORAL_TABLET | Freq: Every day | ORAL | Status: DC | PRN
  Administered 2012-01-02 – 2012-01-05 (×4): 15 mg via ORAL
  Filled 2012-01-02 (×4): qty 3

## 2012-01-02 MED ORDER — THIAMINE HCL 100 MG/ML IJ SOLN: 100.0000 mg | Freq: Once | INTRAMUSCULAR | Status: DC

## 2012-01-02 MED ORDER — CHLORDIAZEPOXIDE HCL 25 MG PO CAPS: 25.0000 mg | ORAL_CAPSULE | ORAL | Status: DC

## 2012-01-02 MED ORDER — LIDOCAINE 5 % EX PTCH
1.0000 | MEDICATED_PATCH | CUTANEOUS | Status: DC
  Administered 2012-01-02 – 2012-01-04 (×3): 1 via TRANSDERMAL
  Filled 2012-01-02 (×6): qty 1

## 2012-01-02 MED ORDER — FLUOXETINE HCL 10 MG PO CAPS
30.0000 mg | ORAL_CAPSULE | Freq: Every day | ORAL | Status: DC
  Administered 2012-01-03 – 2012-01-05 (×3): 30 mg via ORAL
  Filled 2012-01-02 (×5): qty 3

## 2012-01-02 MED ORDER — CLONAZEPAM 1 MG PO TABS
1.0000 mg | ORAL_TABLET | Freq: Every day | ORAL | Status: DC
  Administered 2012-01-02 – 2012-01-04 (×3): 1 mg via ORAL
  Filled 2012-01-02 (×3): qty 1

## 2012-01-02 MED ORDER — MIRTAZAPINE 45 MG PO TABS
45.0000 mg | ORAL_TABLET | Freq: Every day | ORAL | Status: DC
  Administered 2012-01-02 – 2012-01-04 (×3): 45 mg via ORAL
  Filled 2012-01-02 (×5): qty 1

## 2012-01-02 MED ORDER — CHLORDIAZEPOXIDE HCL 25 MG PO CAPS: 25.0000 mg | ORAL_CAPSULE | Freq: Four times a day (QID) | ORAL | Status: DC

## 2012-01-02 MED ORDER — ADULT MULTIVITAMIN W/MINERALS CH
1.0000 | ORAL_TABLET | Freq: Every day | ORAL | Status: DC
  Filled 2012-01-02: qty 1

## 2012-01-02 NOTE — Progress Notes (Signed)
[name] [name] and female pt christina were observed in the dayroom kissing. Staff approached them and they denied that they were doing anything. Staff also talked to them about keeping their distance from each other if they did not want others to get the wrong impression. RN was notified of this incident.

## 2012-01-02 NOTE — Tx Team (Addendum)
Interdisciplinary Treatment Plan Update (Adult)  Date:  01/02/2012  Time Reviewed:  9:52 AM   Progress in Treatment: Attending groups: Yes Participating in groups:  Yes Taking medication as prescribed: Yes Tolerating medication:  Yes Family/Significant othe contact made:  Yes Patient understands diagnosis:  Yes Discussing patient identified problems/goals with staff:  Yes Medical problems stabilized or resolved:  Yes Denies suicidal/homicidal ideation: Yes Issues/concerns per patient self-inventory:  None identified Other: N/A  New problem(s) identified: None Identified  Reason for Continuation of Hospitalization: Anxiety Depression Medication stabilization  Interventions implemented related to continuation of hospitalization: mood stabilization, medication monitoring and adjustment, group therapy and psycho education, suicide risk assessment, collateral contact, aftercare planning, ongoing physician assessments and safety checks q 15 mins  Additional comments: Pt came to treatment team as it was initially discussed that pt could possibly d/c today.  Pt was tearful and reports that she is depressed today and doesn't know why.  Pt states that she continues to have pain and wants to look at her medication being increased instead of a d/c today.   Estimated length of stay: 2-3 days  Discharge Plan: Pt has follow up scheduled at [facility] for medication management and therapy.    New goal(s): N/A  Review of initial/current patient goals per problem list:    1.  Goal(s): Address substance use by completing detox protocol  Met:  Yes  Target date: 12/12  As evidenced by: Completed detox protocol today  2.  Goal (s): Reduce depressive symptoms from a 10 to a 3  Met:  No  Target date: 3-5 days  As evidenced by: Pt rates at a 9 today.    3.  Goal (s): Reduce anxiety symptoms from a 10 to a 3  Met:  No  Target date:  3-5 days  As evidenced by: Pt rates at a 9 today.     Attendees: Patient:  [name] [name]  01/02/2012 10:09 AM   Family:     Physician: Geoffery Lyons, MD 01/02/2012 9:52 AM   Nursing: Alease Frame, RN 01/02/2012 9:52 AM   Clinical Social Worker:  Reyes Ivan, LCSWA 01/02/2012  9:52 AM   Other: Berneice Heinrich, RN 01/02/2012  9:52 AM   Other:  Verna Czech, LCSW 01/02/2012 9:52 AM   Other:  Nanine Means, NP 01/02/2012 9:53 AM   Other:     Other:      Scribe for Treatment Team:   Reyes Ivan 01/02/2012 9:52 AM

## 2012-01-02 NOTE — Progress Notes (Signed)
[facility] Group Notes:  (Counselor/Nursing/MHT/Case Management/Adjunct)  01/02/2012 11:27 AM  Type of Therapy:  Psychoeducational Skills  Participation Level:  Active  Participation Quality:  Appropriate and Supportive  Affect:  Appropriate  Cognitive:  Appropriate  Insight:  Engaged  Engagement in Group:  Engaged  Modes of Intervention:  Activity and Socialization  Summary of Progress/Problems:  Pt attended therapeutic activity participating in a game called "Buzz word" with the other pts. Pt was engaged in group and appeared to understand what the groups concept was intended for .     Dalia Heading 01/02/2012, 11:27 AM

## 2012-01-02 NOTE — Clinical Social Work Note (Signed)
[facility] LCSW Group Therapy  01/02/2012  1:15 PM   Type of Therapy:  Group Therapy  Participation Level:  Active  Participation Quality:  Appropriate and Attentive  Affect:  Appropriate  Cognitive:  Alert and Appropriate  Insight:  Developing/Improving  Engagement in Therapy:  Developing/Improving  Modes of Intervention:  Clarification, Confrontation, Discussion, Education, Exploration, Limit-setting, Problem-solving, Rapport Building, Socialization and Support  Summary of Progress/Problems: The topic for today was feelings about relapse.  Pt discussed what relapse prevention is to them and identified triggers that they are on the path to relapse.  Pt processed their feeling towards relapse and was able to relate to peers.  Pt discussed coping skills that can be used for relapse prevention.   Pt states that she continues to work on accepting her limitations and was able to identify things she is able to do with her children that don't require her to do physical labor.  Pt states that she knows she needs to focus on the positive things but has a hard time doing so.    Reyes Ivan, LCSWA 01/02/2012 2:27 PM

## 2012-01-02 NOTE — Progress Notes (Signed)
D sleeping poorly last nite w/meds, appetite is good, energy level is low and abililty to pay attention is improving, depressed 9/10 and hopeless 10/10, no WD s/s, has SI off and on but contracts (thoughts only), attending group and participating, continues w/pain meds scheduled 4x/day, eating meals in the DR, taking meds as ordered by MD A q49min safety checks continue and support offered, depressed affect and tearful this morning, better this afteernoon Encouraged to attend group and participate R patient remains safe on the hall

## 2012-01-02 NOTE — Progress Notes (Signed)
D: Pt has been up and has been engaged in various milieu activities this evening and has been seen interacting with various peers. Pt has endorsed feelings of hopelessness and depression and some vague suicidal thoughts but able to contract for safety. Pt has complained of pain throughout the day and has received all medications as prescribed. A: Pt was offered support and encouragement. Pt was encourage to attend groups. Q 15 minute checks were done for safety.  R:Pt attends groups and interacts with peers and staff. Safety maintained.

## 2012-01-02 NOTE — Progress Notes (Signed)
Psychoeducational Group Note  Date:  01/02/2012 Time:  2000  Group Topic/Focus:  AA group  Participation Level:  Active  Participation Quality:  Appropriate  Affect:  Appropriate  Cognitive:  Appropriate  Insight:  Engaged  Engagement in Group:  Engaged  Additional CommentsFlonnie Hailstone 01/02/2012, 9:50 PM
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
## 2012-01-02 NOTE — Clinical Social Work Note (Signed)
[facility] LCSW Aftercare Discharge Planning Group Note  01/02/2012 8:45 AM   Participation Quality: Did Not Attend   Merryn Thaker Horton, LCSWA  

## 2012-01-02 NOTE — Progress Notes (Signed)
[facility] MD Progress Note  01/02/2012 3:29 PM [name] [name]  MRN:  [id number] Subjective:  [name] [name] is having a very hard time. She did not sleep last time, the pain she states was overwhelming. She continues to feel extremely depressed. She is persistently crying. She admits that she has not been able to get out of his mind what the physician told her about the anticipating loss of function as her condition progresses. She cant just not think about this. But she also endorses that even when she is not thinking about this, she still feels depressed and cant control her crying Diagnosis:   Axis I: Major Depression, recurrent  Axis II: Deferred Axis III:  Past Medical History  Diagnosis Date  . Migraine   . GERD (gastroesophageal reflux disease)     protonix evenings  . Endometriosis     chronic pain  . Anxiety     klonipin for stress disorder  . Degenerative disc disease    Axis IV: as it pertains her back condition Axis V: 51-60 moderate symptoms  ADL's:  Intact  Sleep: Poor  Appetite:  Fair  Suicidal Ideation:  Plan:  Denies Intent:  Denies Means:  Denies Homicidal Ideation:  Plan:  Denies Intent:  Denies Means:  Denies AEB (as evidenced by):  Psychiatric Specialty Exam: Review of Systems  Constitutional: Negative.   HENT: Negative.   Eyes: Negative.   Respiratory: Negative.   Cardiovascular: Negative.   Gastrointestinal: Negative.   Genitourinary: Negative.   Musculoskeletal: Positive for back pain.  Skin: Negative.   Neurological: Negative.   Psychiatric/Behavioral: Positive for depression. The patient is nervous/anxious and has insomnia.     Blood pressure 100/69, pulse 100, temperature 98.4 F (36.9 C), temperature source Oral, resp. rate 16, height 5' 2.25" (1.581 m), weight 68.947 kg (152 lb), last menstrual period 08/10/2011.Body mass index is 27.58 kg/(m^2).  General Appearance: Fairly Groomed  Patent attorney::  Fair  Speech:  Clear and Coherent  Volume:   Decreased  Mood:  Depressed and in pain  Affect:  Tearful  Thought Process:  Coherent and Goal Directed  Orientation:  Full (Time, Place, and Person)  Thought Content:  Rumination  Suicidal Thoughts:  No  Homicidal Thoughts:  No  Memory:  Immediate;   Fair Recent;   Fair Remote;   Fair  Judgement:  Fair  Insight:  Present  Psychomotor Activity:  Decreased  Concentration:  Fair  Recall:  Fair  Akathisia:  No  Handed:  Right  AIMS (if indicated):     Assets:  Desire for Improvement Housing  Sleep:  Number of Hours: 2.75    Current Medications: Current Facility-Administered Medications  Medication Dose Route Frequency Provider Last Rate Last Dose  . acetaminophen (TYLENOL) tablet 650 mg  650 mg Oral Q6H PRN Wonda Cerise, MD   650 mg at 01/01/12 1504  . ARIPiprazole (ABILIFY) tablet 2 mg  2 mg Oral BID AC Rachael Fee, MD      . clonazePAM [facility]) tablet 1 mg  1 mg Oral QHS Rachael Fee, MD      . FLUoxetine (PROZAC) capsule 30 mg  30 mg Oral Daily Rachael Fee, MD      . gabapentin (NEURONTIN) capsule 300 mg  300 mg Oral QID Rachael Fee, MD   300 mg at 01/02/12 1154  . hydrOXYzine (ATARAX/VISTARIL) tablet 25 mg  25 mg Oral Q6H PRN Rachael Fee, MD      . magnesium hydroxide (MILK  OF MAGNESIA) suspension 30 mL  30 mL Oral Daily PRN Wonda Cerise, MD      . methocarbamol (ROBAXIN) tablet 500 mg  500 mg Oral Q8H PRN Rachael Fee, MD      . mirtazapine (REMERON) tablet 45 mg  45 mg Oral QHS Rachael Fee, MD      . nicotine (NICODERM CQ - dosed in mg/24 hours) patch 21 mg  21 mg Transdermal Q0600 Rachael Fee, MD      . oxyCODONE (Oxy IR/ROXICODONE) immediate release tablet 15 mg  15 mg Oral Daily PRN Rachael Fee, MD      . oxyCODONE (Oxy IR/ROXICODONE) immediate release tablet 30 mg  30 mg Oral QID Rachael Fee, MD   30 mg at 01/02/12 1154  . pantoprazole (PROTONIX) EC tablet 40 mg  40 mg Oral Daily Rachael Fee, MD      . promethazine (PHENERGAN) tablet 12.5 mg  12.5 mg  Oral Q6H PRN Rachael Fee, MD        Lab Results: No results found for this or any previous visit (from the past 48 hour(s)).  Physical Findings: AIMS: Facial and Oral Movements Muscles of Facial Expression: None, normal Lips and Perioral Area: None, normal Jaw: None, normal Tongue: None, normal,Extremity Movements Upper (arms, wrists, hands, fingers): None, normal Lower (legs, knees, ankles, toes): None, normal, Trunk Movements Neck, shoulders, hips: None, normal, Overall Severity Severity of abnormal movements (highest score from questions above): None, normal Incapacitation due to abnormal movements: None, normal Patient's awareness of abnormal movements (rate only patient's report): No Awareness, Dental Status Current problems with teeth and/or dentures?: No Does patient usually wear dentures?: No  CIWA:  CIWA-Ar Total: 0  COWS:  COWS Total Score: 4   Treatment Plan Summary: Daily contact with patient to assess and evaluate symptoms and progress in treatment Medication management  Plan: Supportive approach/coping skills           Increase the Prozac to 30 mg, the Abilify to 2 mg BID, Remeron to 45 mg HS  Medical Decision Making Problem Points:  Established problem, worsening (2), Review of last therapy session (1) and Review of psycho-social stressors (1) Data Points:  Review of medication regiment & side effects (2) Review of new medications or change in dosage (2)  I certify that inpatient services furnished can reasonably be expected to improve the patient's condition.   [name] 01/02/2012, 3:29 PM

## 2012-01-03 MED ORDER — POLYETHYLENE GLYCOL 3350 17 G PO PACK
17.0000 g | PACK | Freq: Every day | ORAL | Status: DC
  Administered 2012-01-04: 17 g via ORAL
  Filled 2012-01-03 (×4): qty 1

## 2012-01-03 NOTE — Progress Notes (Signed)
D- Caley is out on the unit attending groups and interacting with peers.  Rates depression and hopelessness at 5. Denies SI.  Reports fair sleep and good appetite.  Compliant with scheduled medication protocol. Requesting pain patch early and explanation given. A- Stated she would discuss med patch schedule with provider. Continue with current POC and evaluation of treatment goals. Continue 15' checks for safety. R-Remains safe.

## 2012-01-03 NOTE — Progress Notes (Signed)
D: Pt has been up and has been active and has been participating in various milieu activities this evening. Pt has endorsed feelings of depression and hopelessness about physical condition, pt did have some passive suicidal thoughts but able to contract for safety on the unit.  A: Pt was offered support and encouragement. Pt was encourage to attend groups. Q 15 minute checks were done for safety.  R:Pt attends groups and interacts with peers and staff. Safety maintained.

## 2012-01-03 NOTE — Clinical Social Work Note (Addendum)
[facility] Group Notes:  (Clinical Social Work)  01/03/2012  10:00-11:00AM  Summary of Progress/Problems:   The main focus of today's process group was for the patient to identify ways in which they have in the past sabotaged their own recovery and reasons they may have done this/what they received from doing it.  We then worked to identify a specific plan to avoid doing this when discharged from the hospital for this admission.  The patient expressed that she is not here for addiction, but also said that she self-sabotages about her depression by telling herself "you feel better and don't need that medication" and that is what leads to increased depression. She knows this is a thought she has to admit is self-sabotaging, and will practice pairing it with a more truthful thought.  The patient also discussed her need for long-term opiates to handle her pain, and the fact that she has to bathe her son, vacuum and other such chores, even though the doctors tell her not to subject her spine to this.  She was able to identify these as additional types of self-sabotaging thoughts.   Type of Therapy:  Group Therapy - Process  Participation Level:  Active  Participation Quality:  Attentive  Affect:  Blunted and Depressed  Cognitive:  Alert, Appropriate and Oriented  Insight:  Engaged  Engagement in Therapy:  Engaged  Modes of Intervention:  Clarification, Education, Limit-setting, Problem-solving, Socialization, Support and Processing, Exploration, Discussion   [name] Mantle, LCSW 01/03/2012, 11:15 AM

## 2012-01-03 NOTE — Progress Notes (Signed)
Psychoeducational Group Note  Date:  01/03/2012 Time:  1315  Group Topic/Focus:  Coping With Mental Health Crisis:   The purpose of this group is to help patients identify strategies for coping with mental health crisis.  Group discusses possible causes of crisis and ways to manage them effectively.  Participation Level:  Active  Participation Quality:  Appropriate  Affect:  Anxious  Cognitive:  Alert and Appropriate  Insight:  Limited  Engagement in Group:  Developing/Improving  Additional Comments:  Cresenciano Lick 01/03/2012, 2:36 PM

## 2012-01-03 NOTE — Progress Notes (Signed)
[facility] MD Progress Note  01/03/2012 2:42 PM [name] [name]  MRN:  [id number] Subjective:  "Slept Ok." Diagnosis:  Substance induced mood disorder  ADL's:  Intact  Sleep: Fair  Appetite:  Poor  Suicidal Ideation:  denies Homicidal Ideation:  denies AEB (as evidenced by):patient's verbal response, affect and response to self inventory  Psychiatric Specialty Exam: Review of Systems  Constitutional: Negative.  Negative for fever, chills, weight loss, malaise/fatigue and diaphoresis.  HENT: Negative for congestion and sore throat.   Eyes: Negative for blurred vision, double vision and photophobia.  Respiratory: Negative for cough, shortness of breath and wheezing.   Cardiovascular: Negative for chest pain, palpitations and PND.  Gastrointestinal: Negative for heartburn, nausea, vomiting, abdominal pain, diarrhea and constipation.  Musculoskeletal: Negative for myalgias, joint pain and falls.  Neurological: Negative for dizziness, tingling, tremors, sensory change, speech change, focal weakness, seizures, loss of consciousness, weakness and headaches.  Endo/Heme/Allergies: Negative for polydipsia. Does not bruise/bleed easily.  Psychiatric/Behavioral: Negative for depression, suicidal ideas, hallucinations, memory loss and substance abuse. The patient is not nervous/anxious and does not have insomnia.   All other systems reviewed and are negative.   Patient did ask for miralax  Blood pressure 140/88, pulse 120, temperature 98 F (36.7 C), temperature source Oral, resp. rate 16, height 5' 2.25" (1.581 m), weight 68.947 kg (152 lb), last menstrual period 08/10/2011.Body mass index is 27.58 kg/(m^2).  General Appearance: Casual  Eye Contact::  Fair  Speech:  Clear and Coherent  Volume:  Normal  Mood:  Anxious and Depressed  Affect:  Congruent  Thought Process:  Coherent  Orientation:  Full (Time, Place, and Person)  Thought Content:  Negative  Suicidal Thoughts:  No  "not today"   Homicidal Thoughts:  No  Memory:  Immediate;   Fair  Judgement:  Intact  Insight:  Present  Psychomotor Activity:  Normal  Concentration:  Fair  Recall:  Fair  Akathisia:  No  Handed:  Right  AIMS (if indicated):     Assets:  Communication Skills Desire for Improvement  Sleep:  Number of Hours: 5.75    Current Medications: Current Facility-Administered Medications  Medication Dose Route Frequency Provider Last Rate Last Dose  . acetaminophen (TYLENOL) tablet 650 mg  650 mg Oral Q6H PRN Wonda Cerise, MD   650 mg at 01/01/12 1504  . ARIPiprazole (ABILIFY) tablet 2 mg  2 mg Oral BID AC Rachael Fee, MD   2 mg at 01/03/12 [phone]  . clonazePAM (KLONOPIN) tablet 1 mg  1 mg Oral QHS Rachael Fee, MD   1 mg at 01/02/12 2108  . FLUoxetine (PROZAC) capsule 30 mg  30 mg Oral Daily Rachael Fee, MD   30 mg at 01/03/12 0757  . gabapentin (NEURONTIN) capsule 300 mg  300 mg Oral QID Rachael Fee, MD   300 mg at 01/03/12 1206  . hydrOXYzine (ATARAX/VISTARIL) tablet 25 mg  25 mg Oral Q6H PRN Rachael Fee, MD      . lidocaine (LIDODERM) 5 % 1 patch  1 patch Transdermal Q24H Rachael Fee, MD   1 patch at 01/02/12 1701  . magnesium hydroxide (MILK OF MAGNESIA) suspension 30 mL  30 mL Oral Daily PRN Wonda Cerise, MD      . methocarbamol (ROBAXIN) tablet 500 mg  500 mg Oral Q8H PRN Rachael Fee, MD      . mirtazapine (REMERON) tablet 45 mg  45 mg Oral QHS Rachael Fee, MD  45 mg at 01/02/12 2212  . nicotine (NICODERM CQ - dosed in mg/24 hours) patch 21 mg  21 mg Transdermal Q0600 Rachael Fee, MD   21 mg at 01/03/12 0644  . oxyCODONE (Oxy IR/ROXICODONE) immediate release tablet 15 mg  15 mg Oral Daily PRN Rachael Fee, MD   15 mg at 01/03/12 [phone]  . oxyCODONE (Oxy IR/ROXICODONE) immediate release tablet 30 mg  30 mg Oral QID Rachael Fee, MD   30 mg at 01/03/12 1204  . pantoprazole (PROTONIX) EC tablet 40 mg  40 mg Oral Daily Rachael Fee, MD   40 mg at 01/03/12 0758  . polyethylene glycol (MIRALAX  / GLYCOLAX) packet 17 g  17 g Oral Daily Verne Spurr, PA-C      . promethazine (PHENERGAN) tablet 12.5 mg  12.5 mg Oral Q6H PRN Rachael Fee, MD        Lab Results: No results found for this or any previous visit (from the past 48 hour(s)).  Physical Findings: AIMS: Facial and Oral Movements Muscles of Facial Expression: None, normal Lips and Perioral Area: None, normal Jaw: None, normal Tongue: None, normal,Extremity Movements Upper (arms, wrists, hands, fingers): None, normal Lower (legs, knees, ankles, toes): None, normal, Trunk Movements Neck, shoulders, hips: None, normal, Overall Severity Severity of abnormal movements (highest score from questions above): None, normal Incapacitation due to abnormal movements: None, normal Patient's awareness of abnormal movements (rate only patient's report): No Awareness, Dental Status Current problems with teeth and/or dentures?: No Does patient usually wear dentures?: No  CIWA:  CIWA-Ar Total: 0  COWS:  COWS Total Score: 4   Treatment Plan Summary: Daily contact with patient to assess and evaluate symptoms and progress in treatment Medication management  Plan:  Medical Decision Making Problem Points:  Established problem, stable/improving (1) Data Points:  Review or order clinical lab tests (1) Review of medication regiment & side effects (2)  I certify that inpatient services furnished can reasonably be expected to improve the patient's condition.   Rona Ravens. Alizeh Madril PAC 01/03/2012, 2:42 PM

## 2012-01-04 DIAGNOSIS — F1010 Alcohol abuse, uncomplicated: Secondary | ICD-10-CM

## 2012-01-04 DIAGNOSIS — F1994 Other psychoactive substance use, unspecified with psychoactive substance-induced mood disorder: Secondary | ICD-10-CM | POA: Diagnosis present

## 2012-01-04 HISTORY — DX: Alcohol abuse, uncomplicated: F10.10

## 2012-01-04 MED ORDER — POLYETHYLENE GLYCOL 3350 17 G PO PACK
17.0000 g | PACK | Freq: Two times a day (BID) | ORAL | Status: DC
  Administered 2012-01-05: 17 g via ORAL
  Filled 2012-01-04: qty 14
  Filled 2012-01-04 (×4): qty 1
  Filled 2012-01-04: qty 14

## 2012-01-04 NOTE — Clinical Social Work Note (Signed)
[facility] Group Notes: (Clinical Social Work)   01/04/2012   10-11am   Type of Therapy:  Group Therapy   Participation Level:  Did Not Attend    Ambrose Mantle, LCSW 01/04/2012, 11:11 AM

## 2012-01-04 NOTE — Progress Notes (Signed)
[facility] Group Notes:  (Counselor/Nursing/MHT/Case Management/Adjunct)  01/03/2012 2100   Type of Therapy:  wrap up group  Participation Level:  Active  Participation Quality:  Inattentive, Redirectable and Sharing  Affect:  Appropriate  Cognitive:  Appropriate  Insight:  Limited  Engagement in Group:  Lacking  Engagement in Therapy:  Improving  Modes of Intervention:  Clarification, Education and Support  Summary of Progress/Problems:Pt initially shared only details about a card game she had been playing with her peer patients.  Pt did report that she was starting to emotionally feel better and wanted to continue this improvement.  Pt reported " I just want to stop crying".   [name] 01/04/2012, 12:46 AM

## 2012-01-04 NOTE — Progress Notes (Signed)
D   Pt is pleasant and somewhat flirtatious with males  So far today her behavior has been appropriate toward males except for being a little flirtatious   She takes her medications without difficulty and attends and participates in groups   She appears mildly anxious and depressed A   Verbal support given   Medications administered and effectiveness monitored  Q 15 min checks R   Pt safe at present

## 2012-01-04 NOTE — Progress Notes (Signed)
[facility] MD Progress Note  01/04/2012 9:40 AM [name] [name]  MRN:  [id number] Subjective:  3/10 depression. 5/10 anxiety and hopelessness Diagnosis:   Axis I: Depressive Disorder NOS, Substance Abuse and Substance Induced Mood Disorder Axis II: Deferred Axis III:  Past Medical History  Diagnosis Date  . Migraine   . GERD (gastroesophageal reflux disease)     protonix evenings  . Endometriosis     chronic pain  . Anxiety     klonipin for stress disorder  . Degenerative disc disease    Axis IV: economic problems, occupational problems, other psychosocial or environmental problems, problems related to social environment and problems with primary support group Axis V: 41-50 serious symptoms  ADL's:  Intact  Sleep: Fair  Appetite:  Poor  Suicidal Ideation:  Denies Homicidal Ideation:  Denies  Psychiatric Specialty Exam: Review of Systems  Constitutional: Negative.   HENT: Negative.   Eyes: Negative.   Respiratory: Negative.   Cardiovascular: Negative.   Gastrointestinal: Negative.   Genitourinary: Negative.   Musculoskeletal: Positive for back pain.  Skin: Negative.   Neurological: Negative.   Endo/Heme/Allergies: Negative.   Psychiatric/Behavioral: Positive for depression and substance abuse. The patient is nervous/anxious.     Blood pressure 127/91, pulse 100, temperature 98.6 F (37 C), temperature source Oral, resp. rate 20, height 5' 2.25" (1.581 m), weight 68.947 kg (152 lb), last menstrual period 08/10/2011.Body mass index is 27.58 kg/(m^2).  General Appearance: Casual  Eye Contact::  Fair  Speech:  Normal Rate  Volume:  Normal  Mood:  Depressed  Affect:  Congruent  Thought Process:  Intact  Orientation:  Full (Time, Place, and Person)  Thought Content:  WDL  Suicidal Thoughts:  No  Homicidal Thoughts:  No  Memory:  Immediate;   Fair Recent;   Fair Remote;   Fair  Judgement:  Fair  Insight:  Fair  Psychomotor Activity:  Decreased  Concentration:  Fair   Recall:  Fair  Akathisia:  No  Handed:  Right  AIMS (if indicated):     Assets:  Communication Skills Desire for Improvement  Sleep:  Number of Hours: 4    Current Medications: Current Facility-Administered Medications  Medication Dose Route Frequency Provider Last Rate Last Dose  . acetaminophen (TYLENOL) tablet 650 mg  650 mg Oral Q6H PRN Wonda Cerise, MD   650 mg at 01/01/12 1504  . ARIPiprazole (ABILIFY) tablet 2 mg  2 mg Oral BID AC Rachael Fee, MD   2 mg at 01/04/12 [phone]  . clonazePAM (KLONOPIN) tablet 1 mg  1 mg Oral QHS Rachael Fee, MD   1 mg at 01/03/12 2144  . FLUoxetine (PROZAC) capsule 30 mg  30 mg Oral Daily Rachael Fee, MD   30 mg at 01/04/12 0810  . gabapentin (NEURONTIN) capsule 300 mg  300 mg Oral QID Rachael Fee, MD   300 mg at 01/04/12 0810  . hydrOXYzine (ATARAX/VISTARIL) tablet 25 mg  25 mg Oral Q6H PRN Rachael Fee, MD      . lidocaine (LIDODERM) 5 % 1 patch  1 patch Transdermal Q24H Rachael Fee, MD   1 patch at 01/03/12 1818  . magnesium hydroxide (MILK OF MAGNESIA) suspension 30 mL  30 mL Oral Daily PRN Wonda Cerise, MD      . methocarbamol (ROBAXIN) tablet 500 mg  500 mg Oral Q8H PRN Rachael Fee, MD   500 mg at 01/04/12 0702  . mirtazapine (REMERON) tablet 45 mg  45  mg Oral QHS Rachael Fee, MD   45 mg at 01/03/12 2144  . nicotine (NICODERM CQ - dosed in mg/24 hours) patch 21 mg  21 mg Transdermal Q0600 Rachael Fee, MD   21 mg at 01/04/12 [phone]  . oxyCODONE (Oxy IR/ROXICODONE) immediate release tablet 15 mg  15 mg Oral Daily PRN Rachael Fee, MD   15 mg at 01/04/12 6962  . oxyCODONE (Oxy IR/ROXICODONE) immediate release tablet 30 mg  30 mg Oral QID Rachael Fee, MD   30 mg at 01/04/12 0810  . pantoprazole (PROTONIX) EC tablet 40 mg  40 mg Oral Daily Rachael Fee, MD   40 mg at 01/04/12 0810  . polyethylene glycol (MIRALAX / GLYCOLAX) packet 17 g  17 g Oral Daily Verne Spurr, PA-C   17 g at 01/04/12 0814  . promethazine (PHENERGAN) tablet 12.5 mg   12.5 mg Oral Q6H PRN Rachael Fee, MD        Lab Results: No results found for this or any previous visit (from the past 48 hour(s)).  Physical Findings: AIMS: Facial and Oral Movements Muscles of Facial Expression: None, normal Lips and Perioral Area: None, normal Jaw: None, normal Tongue: None, normal,Extremity Movements Upper (arms, wrists, hands, fingers): None, normal Lower (legs, knees, ankles, toes): None, normal, Trunk Movements Neck, shoulders, hips: None, normal, Overall Severity Severity of abnormal movements (highest score from questions above): None, normal Incapacitation due to abnormal movements: None, normal Patient's awareness of abnormal movements (rate only patient's report): No Awareness, Dental Status Current problems with teeth and/or dentures?: No Does patient usually wear dentures?: No  CIWA:  CIWA-Ar Total: 0  COWS:  COWS Total Score: 4   Treatment Plan Summary: Daily contact with patient to assess and evaluate symptoms and progress in treatment Medication management  Plan:   1-Individual and group therapy 2-Medication management for depression and opiate dependency 3-Family supportive and will go to [facility] for counseling after discharge 4-Pain clinic after discharge for pain management 5-Supportive environment in place  Medical Decision Making Problem Points:  Established problem, stable/improving (1) and Review of psycho-social stressors (1) Data Points:  Review of medication regiment & side effects (2)  I certify that inpatient services furnished can reasonably be expected to improve the patient's condition.   Nanine Means, PMH-NP 01/04/2012, 9:40 AM

## 2012-01-04 NOTE — Progress Notes (Signed)
Patient did attend the evening speaker AA meeting.  

## 2012-01-04 NOTE — Progress Notes (Signed)
Patient ID: [name] [name], female   DOB: [birth year], 31 y.o.   MRN: [id number] D)  Attended group this evening, came to med window afterward for her meds, talked about how bad her back is and how she has been told that she will probably be wheelchair bound in 10 years or less.  She said she usually took the oxycodone 6 times a day instead of 4.  Seemed very discouraged at the time she was telling this.   A)  Will continue to monitor q 15 minutes for safety, offer support and encouragement. R)  Receptive, appreciative.  01:00  Addendum:  Was found hiding in female pt's bathroom across the hall.  When the door was opened to the bathroom, she said,"I knew I'd get in trouble".   Was told to go back to her room by the mht, which she did.

## 2012-01-05 DIAGNOSIS — F329 Major depressive disorder, single episode, unspecified: Secondary | ICD-10-CM | POA: Diagnosis present

## 2012-01-05 MED ORDER — ARIPIPRAZOLE 2 MG PO TABS
2.0000 mg | ORAL_TABLET | Freq: Two times a day (BID) | ORAL | Status: DC
  Administered 2012-01-05: 2 mg via ORAL
  Filled 2012-01-05: qty 1

## 2012-01-05 MED ORDER — FLUOXETINE HCL 10 MG PO CAPS: 30.0000 mg | ORAL_CAPSULE | Freq: Every day | ORAL | Status: DC

## 2012-01-05 MED ORDER — LIDOCAINE 5 % EX PTCH: 1.0000 | MEDICATED_PATCH | CUTANEOUS | Status: DC

## 2012-01-05 MED ORDER — GABAPENTIN 300 MG PO CAPS: 300.0000 mg | ORAL_CAPSULE | Freq: Four times a day (QID) | ORAL | Status: DC

## 2012-01-05 MED ORDER — CLONAZEPAM 1 MG PO TABS: 1.0000 mg | ORAL_TABLET | Freq: Every day | ORAL | Status: DC

## 2012-01-05 MED ORDER — POLYETHYLENE GLYCOL 3350 17 G PO PACK: 17.0000 g | PACK | Freq: Two times a day (BID) | ORAL | Status: DC

## 2012-01-05 MED ORDER — ARIPIPRAZOLE 2 MG PO TABS: 2.0000 mg | ORAL_TABLET | Freq: Two times a day (BID) | ORAL | Status: DC

## 2012-01-05 MED ORDER — CLONIDINE HCL 0.1 MG PO TABS
0.1000 mg | ORAL_TABLET | Freq: Two times a day (BID) | ORAL | Status: DC
  Filled 2012-01-05 (×2): qty 6

## 2012-01-05 MED ORDER — OXYCODONE HCL 30 MG PO TABS: 30.0000 mg | ORAL_TABLET | Freq: Four times a day (QID) | ORAL | Status: DC

## 2012-01-05 MED ORDER — METHOCARBAMOL 500 MG PO TABS: 500.0000 mg | ORAL_TABLET | Freq: Three times a day (TID) | ORAL | Status: DC | PRN

## 2012-01-05 MED ORDER — NICOTINE 21 MG/24HR TD PT24: 1.0000 | MEDICATED_PATCH | Freq: Every day | TRANSDERMAL | Status: DC

## 2012-01-05 MED ORDER — HYDROXYZINE HCL 25 MG PO TABS: 25.0000 mg | ORAL_TABLET | Freq: Four times a day (QID) | ORAL | Status: DC | PRN

## 2012-01-05 MED ORDER — OXYCODONE HCL 15 MG PO TABS: 15.0000 mg | ORAL_TABLET | Freq: Every day | ORAL | Status: DC | PRN

## 2012-01-05 MED ORDER — CLONIDINE HCL 0.1 MG PO TABS: 0.1000 mg | ORAL_TABLET | Freq: Two times a day (BID) | ORAL | Status: DC

## 2012-01-05 MED ORDER — PANTOPRAZOLE SODIUM 40 MG PO TBEC: 40.0000 mg | DELAYED_RELEASE_TABLET | Freq: Every day | ORAL | Status: DC

## 2012-01-05 MED ORDER — CLONIDINE HCL 0.1 MG PO TABS
0.1000 mg | ORAL_TABLET | Freq: Two times a day (BID) | ORAL | Status: DC
  Filled 2012-01-05 (×4): qty 1

## 2012-01-05 MED ORDER — GABAPENTIN 300 MG PO CAPS
300.0000 mg | ORAL_CAPSULE | Freq: Four times a day (QID) | ORAL | Status: DC
  Administered 2012-01-05: 300 mg via ORAL
  Filled 2012-01-05 (×2): qty 1

## 2012-01-05 MED ORDER — MIRTAZAPINE 45 MG PO TABS: 45.0000 mg | ORAL_TABLET | Freq: Every day | ORAL | Status: DC

## 2012-01-05 NOTE — Tx Team (Signed)
Interdisciplinary Treatment Plan Update (Adult)  Date:  01/05/2012  Time Reviewed:  9:48 AM   Progress in Treatment: Attending groups: Yes Participating in groups:  Yes Taking medication as prescribed: Yes Tolerating medication:  Yes Family/Significant othe contact made:  Yes Patient understands diagnosis:  Yes Discussing patient identified problems/goals with staff:  Yes Medical problems stabilized or resolved:  Yes Denies suicidal/homicidal ideation: Yes Issues/concerns per patient self-inventory:  None identified Other: N/A  New problem(s) identified: None Identified  Reason for Continuation of Hospitalization: Stable to d/c  Interventions implemented related to continuation of hospitalization: Stable to d/c  Additional comments: N/A  Estimated length of stay: D/C today  Discharge Plan: Pt will follow up with [facility] for medication management and therapy.    New goal(s): N/A  Review of initial/current patient goals per problem list:    1.  Goal (s): Reduce depressive symptoms by reducing from a 10 to a 3  Met:  Yes  Target date: today  As evidenced by: Pt rates at a 3 today.     2.  Goal (s): Reduce anxiety symptoms by reducing from a 10 to a 3  Met:  Yes  Target date: today  As evidenced by: Pt rates at a 3 today.     Attendees: Patient:   01/05/2012 9:48 AM   Family:     Physician:  Geoffery Lyons, MD 01/05/2012 9:48 AM   Nursing: Robbie Louis, RN 01/05/2012 9:48 AM   Clinical Social Worker:  Reyes Ivan, LCSWA 01/05/2012 9:48 AM   Other: Dellia Cloud, RN 01/05/2012 9:48 AM   Other:  Olivia Mackie, Psyc intern 01/05/2012 9:49 AM   Other:  Verna Czech, LCSW 01/05/2012 9:49 AM   Other:     Other:      Scribe for Treatment Team:   Reyes Ivan 01/05/2012 9:48 AM

## 2012-01-05 NOTE — Progress Notes (Signed)
[facility] Adult Case Management Discharge Plan :  Will you be returning to the same living situation after discharge: Yes,  returning to own home At discharge, do you have transportation home?:Yes,  access to transportation Do you have the ability to pay for your medications:Yes,  access to meds  Release of information consent forms completed and in the chart;  Patient's signature needed at discharge.  Patient to Follow up at: Follow-up Information    Follow up with [facility]. On 01/06/2012. (Walk in at 8:00 am on this date to see Dr. Kathlene Cote)    Contact information:   [address], [place] [address] [phone]         Patient denies SI/HI:   Yes,  denies SI/HI    Safety Planning and Suicide Prevention discussed:  Yes,  discussed with pt today  No recommendations from CSW.  No further needs voiced by pt.  Pt stable to discharge.    Carmina Miller 01/05/2012, 11:00 AM

## 2012-01-05 NOTE — Discharge Summary (Signed)
Physician Discharge Summary Note  Patient:  [name] [name] is an 31 y.o., female MRN:  [id number] DOB:  Apr 10, 1980 Patient phone:  [phone] (home)  Patient address:   [address] [place] [facility] [address],   Date of Admission:  12/27/2011 Date of Discharge: 01/05/2012  Reason for Admission:  Depression with intentional overdose, opiate/alcohol dependency  Discharge Diagnoses: Active Problems:  Opioid dependence  Back pain  Alcohol dependence  Review of Systems  Constitutional: Negative.   HENT: Negative.   Eyes: Negative.   Respiratory: Negative.   Cardiovascular: Negative.   Gastrointestinal: Negative.   Genitourinary: Negative.   Musculoskeletal: Positive for back pain.  Skin: Negative.   Neurological: Negative.   Endo/Heme/Allergies: Negative.   Psychiatric/Behavioral: Positive for substance abuse.   Axis Diagnosis:   AXIS I:  Alcohol Abuse, Anxiety Disorder NOS, Substance Abuse and Substance Induced Mood Disorder AXIS II:  Deferred AXIS III:   Past Medical History  Diagnosis Date  . Migraine   . GERD (gastroesophageal reflux disease)     protonix evenings  . Endometriosis     chronic pain  . Anxiety     klonipin for stress disorder  . Degenerative disc disease    AXIS IV:  economic problems, other psychosocial or environmental problems, problems related to social environment and problems with primary support group AXIS V:  61-70 mild symptoms  Level of Care:  OP  Hospital Course:  Medication management for depression and back pain, detox was successful, patient attended and participated in individual and group therapy, Rx given, will follow-up with [facility], patient stable for discharge.  Consults:  None  Significant Diagnostic Studies:  labs: Completed and reviewed, stable  Discharge Vitals:   Blood pressure 128/84, pulse 106, temperature 97.2 F (36.2 C), temperature source Oral, resp. rate 16, height 5' 2.25" (1.581 m), weight 68.947 kg  (152 lb), last menstrual period 08/10/2011. Body mass index is 27.58 kg/(m^2). Lab Results:   No results found for this or any previous visit (from the past 72 hour(s)).  Physical Findings: AIMS: Facial and Oral Movements Muscles of Facial Expression: None, normal Lips and Perioral Area: None, normal Jaw: None, normal Tongue: None, normal,Extremity Movements Upper (arms, wrists, hands, fingers): None, normal Lower (legs, knees, ankles, toes): None, normal, Trunk Movements Neck, shoulders, hips: None, normal, Overall Severity Severity of abnormal movements (highest score from questions above): None, normal Incapacitation due to abnormal movements: None, normal Patient's awareness of abnormal movements (rate only patient's report): No Awareness, Dental Status Current problems with teeth and/or dentures?: No Does patient usually wear dentures?: No  CIWA:  CIWA-Ar Total: 0  COWS:  COWS Total Score: 4   Psychiatric Specialty Exam: See Psychiatric Specialty Exam and Suicide Risk Assessment completed by Attending Physician prior to discharge.  Discharge destination:  Home  Is patient on multiple antipsychotic therapies at discharge:  No   Has Patient had three or more failed trials of antipsychotic monotherapy by history:  No Recommended Plan for Multiple Antipsychotic Therapies:  N/A  Discharge Orders    Future Orders Please Complete By Expires   Diet - low sodium heart healthy      Activity as tolerated - No restrictions          Medication List     As of 01/05/2012 10:47 AM    STOP taking these medications         amitriptyline 100 MG tablet   Commonly known as: ELAVIL      naproxen sodium 220 MG  tablet   Commonly known as: ANAPROX      zolpidem 10 MG tablet   Commonly known as: AMBIEN      TAKE these medications      Indication    ARIPiprazole 2 MG tablet   Commonly known as: ABILIFY   Take 1 tablet (2 mg total) by mouth 2 (two) times daily before a meal.     Indication: Major Depressive Disorder      clonazePAM 1 MG tablet   Commonly known as: KLONOPIN   Take 1 tablet (1 mg total) by mouth at bedtime.    Indication: anxiety      FLUoxetine 10 MG capsule   Commonly known as: PROZAC   Take 3 capsules (30 mg total) by mouth daily.    Indication: Depression      gabapentin 300 MG capsule   Commonly known as: NEURONTIN   Take 1 capsule (300 mg total) by mouth 4 (four) times daily.    Indication: Pain      hydrOXYzine 25 MG tablet   Commonly known as: ATARAX/VISTARIL   Take 1 tablet (25 mg total) by mouth every 6 (six) hours as needed for anxiety.       lidocaine 5 %   Commonly known as: LIDODERM   Place 1 patch onto the skin daily. Remove & Discard patch within 12 hours or as directed by MD    Indication: back pain      methocarbamol 500 MG tablet   Commonly known as: ROBAXIN   Take 1 tablet (500 mg total) by mouth every 8 (eight) hours as needed.    Indication: Musculoskeletal Pain      mirtazapine 45 MG tablet   Commonly known as: REMERON   Take 1 tablet (45 mg total) by mouth at bedtime.    Indication: Trouble Sleeping      oxyCODONE 15 MG immediate release tablet   Commonly known as: ROXICODONE   Take 1 tablet (15 mg total) by mouth daily as needed.    Indication: Moderate to Severe Pain      oxycodone 30 MG immediate release tablet   Commonly known as: ROXICODONE   Take 1 tablet (30 mg total) by mouth 4 (four) times daily.    Indication: Moderate to Severe Pain      pantoprazole 40 MG tablet   Commonly known as: PROTONIX   Take 1 tablet (40 mg total) by mouth daily.    Indication: Gastroesophageal Reflux Disease      polyethylene glycol packet   Commonly known as: MIRALAX / GLYCOLAX   Take 17 g by mouth 2 (two) times daily.    Indication: Constipation           Follow-up Information    Follow up with [facility]. On 01/06/2012. (Walk in at 8:00 am on this date to see Dr. Kathlene Cote)    Contact information:   [address], [place] [address] [phone]         Follow-up recommendations:  Activity as tolerated, low-sodium heart healthy diet  Comments:  Patient denies suicidal/homicidal ideations and hallucinations, depression/anxiety/back pain stable, follow-up with [facility].  Total Discharge Time:  Greater than 30 minutes  Signed: Nanine Means, PMH-NP 01/05/2012, 10:47 AM

## 2012-01-05 NOTE — Progress Notes (Signed)
Patient ID: [name] [name], female   DOB: March 17, 1980, 31 y.o.   MRN: [id number] D)  Came to med window right after group this evening, stated she was having severe back pain and wanted her oxycodone.  Rated her pain at an 8.  Commented to a peer about the puzzle he was working on and then talked about how she and a female peer who was discharged today, had sat there last night acting like they were working on the puzzle.  Stated they didn't get anything done, and then winked at me.  Stated that was the only room here that doesn't have a camera, and giggled like she had gotten away with something.  Gamey attitude, flippant,   A)  Will continue to monitor q 15 minutes for safety, encourage group and participation, encourage appropriate behavior and to focus on why she is here. R)  Remains safe on unit at this time.

## 2012-01-05 NOTE — Progress Notes (Signed)
Psychoeducational Group Note  Date:  01/05/2012 Time:  1100  Group Topic/Focus:  Self Care:   The focus of this group is to help patients understand the importance of self-care in order to improve or restore emotional, physical, spiritual, interpersonal, and financial health.  Participation Level:  Did Not Attend  Participation Quality:    Affect:    Cognitive:    Insight:    Engagement in Group:    Additional Comments:  Pt was in the bed  [name] [name] M 01/05/2012, 1:32 PM

## 2012-01-05 NOTE — [facility] Suicide Risk Assessment (Signed)
Suicide Risk Assessment  Discharge Assessment     Demographic Factors:  NA  Mental Status Per Nursing Assessment::   On Admission:  Suicidal ideation indicated by patient  Current Mental Status by Physician: In full contact with reality. There are no suicidal ideas, plans or intent. Her mood is "much better." Affect is appropriate. She feels that the addition of Abilify has helped the depression. She is going to be seen at the pain clinic today. She is going to pursue further outpatient follow up.   Loss Factors: Decline in physical health  Historical Factors: NA  Risk Reduction Factors:   Responsible for children under 33 years of age, Sense of responsibility to family, Living with another person, especially a relative and Positive social support  Continued Clinical Symptoms:  Depression:   Comorbid alcohol abuse/dependence Alcohol/Substance Abuse/Dependencies Chronic Pain  Cognitive Features That Contribute To Risk: None identified   Suicide Risk:  Minimal: No identifiable suicidal ideation.  Patients presenting with no risk factors but with morbid ruminations; may be classified as minimal risk based on the severity of the depressive symptoms  Discharge Diagnoses:   AXIS I:  Major Depression recurrent, Opioid Dependence AXIS II:  Deferred AXIS III:   Past Medical History  Diagnosis Date  . Migraine   . GERD (gastroesophageal reflux disease)     protonix evenings  . Endometriosis     chronic pain  . Anxiety     klonipin for stress disorder  . Degenerative disc disease    AXIS IV:  Problems associated with her back pain AXIS V:  61-70 mild symptoms  Plan Of Care/Follow-up recommendations:  Activity:  As tolerated Diet:  Regular Follow up at [facility] Is patient on multiple antipsychotic therapies at discharge:  No   Has Patient had three or more failed trials of antipsychotic monotherapy by history:  No  Recommended Plan for Multiple Antipsychotic Therapies:  N/A   Nancee Brownrigg A 01/05/2012, 1:50 PM

## 2012-01-05 NOTE — Clinical Social Work Note (Signed)
[facility] LCSW Aftercare Discharge Planning Group Note  01/05/2012 8:45 AM  Participation Quality:  Appropriate and Attentive  Affect:  Appropriate  Cognitive:  Alert and Appropriate  Insight:  Engaged  Engagement in Group:  Engaged  Modes of Intervention:  Clarification, Discussion, Education, Exploration, Orientation, Problem-solving, Rapport Building, Socialization and Support  Summary of Progress/Problems: Pt attended discharge planning group and actively participated in group.  CSW provided pt with today's workbook.  Pt rates depression and anxiety at a 3 today.  Pt denies SI/HI.  Pt reports feeling stable to d/c today.  Pt has follow up scheduled at [facility] for medication management and therapy.  No further needs voiced by pt at this time.  Safety planning and suicide prevention discussed.  Pt participated in discussion and acknowledged an understanding of the information provided.       Reyes Ivan, LCSWA 01/05/2012 10:56 AM

## 2012-01-05 NOTE — Progress Notes (Signed)
D/C instructions/meds/follow-up appointments reviewed, pt verbalized understanding, pt's belongings returned to pt, samples given, denies SI/HI/AVH 

## 2012-01-05 NOTE — Progress Notes (Signed)
Psychoeducational Group Note  Date:  01/04/2012 Time:  1515  Group Topic/Focus:  Conflict Resolution:   The focus of this group is to discuss the conflict resolution process and how it may be used upon discharge.  Participation Level:  Active  Participation Quality:  Appropriate, Sharing and Supportive  Affect:  Appropriate  Cognitive:  Appropriate  Insight:  Supportive  Engagement in Group:  Supportive  Additional Comments:  none  Erskine Steinfeldt M 01/05/2012, 1:33 PM

## 2012-01-06 NOTE — Discharge Summary (Signed)
Agree with assessment and plan Seaver Machia A. Sharalee Witman, M.D. 

## 2012-01-08 NOTE — Progress Notes (Signed)
Patient Discharge Instructions:  After Visit Summary (AVS):   Faxed to:  01/08/12 Psychiatric Admission Assessment Note:   Faxed to:  01/08/12 Suicide Risk Assessment - Discharge Assessment:   Faxed to:  01/08/12 Faxed/Sent to the Next Level Care provider:  01/08/12 Faxed to [facility] @ [phone]  [name] [name], 01/08/2012, 2:28 PM

## 2012-01-29 ENCOUNTER — Encounter (HOSPITAL_COMMUNITY): Payer: Self-pay | Admitting: *Deleted

## 2012-01-29 ENCOUNTER — Emergency Department (HOSPITAL_COMMUNITY)
Admission: EM | Admit: 2012-01-29 | Discharge: 2012-01-29 | Disposition: A | Discharge status: Home / Self Care | Payer: Medicaid Other | Attending: Emergency Medicine | Admitting: Emergency Medicine

## 2012-01-29 DIAGNOSIS — F17200 Nicotine dependence, unspecified, uncomplicated: Secondary | ICD-10-CM | POA: Insufficient documentation

## 2012-01-29 DIAGNOSIS — Z8679 Personal history of other diseases of the circulatory system: Secondary | ICD-10-CM | POA: Insufficient documentation

## 2012-01-29 DIAGNOSIS — G8929 Other chronic pain: Secondary | ICD-10-CM | POA: Insufficient documentation

## 2012-01-29 DIAGNOSIS — Z79899 Other long term (current) drug therapy: Secondary | ICD-10-CM | POA: Insufficient documentation

## 2012-01-29 DIAGNOSIS — K219 Gastro-esophageal reflux disease without esophagitis: Secondary | ICD-10-CM | POA: Insufficient documentation

## 2012-01-29 DIAGNOSIS — F19939 Other psychoactive substance use, unspecified with withdrawal, unspecified: Secondary | ICD-10-CM | POA: Insufficient documentation

## 2012-01-29 DIAGNOSIS — F1123 Opioid dependence with withdrawal: Secondary | ICD-10-CM

## 2012-01-29 DIAGNOSIS — Z8742 Personal history of other diseases of the female genital tract: Secondary | ICD-10-CM | POA: Insufficient documentation

## 2012-01-29 DIAGNOSIS — Z8739 Personal history of other diseases of the musculoskeletal system and connective tissue: Secondary | ICD-10-CM | POA: Insufficient documentation

## 2012-01-29 DIAGNOSIS — F411 Generalized anxiety disorder: Secondary | ICD-10-CM | POA: Insufficient documentation

## 2012-01-29 LAB — COMPREHENSIVE METABOLIC PANEL
ALT: 28 U/L (ref 0–35)
AST: 15 U/L (ref 0–37)
Albumin: 4.3 g/dL (ref 3.5–5.2)
Alkaline Phosphatase: 84 U/L (ref 39–117)
BUN: 15 mg/dL (ref 6–23)
CO2: 21 mEq/L (ref 19–32)
Calcium: 9.8 mg/dL (ref 8.4–10.5)
Chloride: 101 mEq/L (ref 96–112)
Creatinine, Ser: 0.48 mg/dL — ABNORMAL LOW (ref 0.50–1.10)
GFR calc Af Amer: >90 mL/min (ref >90)
GFR calc non Af Amer: >90 mL/min (ref >90)
Glucose, Bld: 147 mg/dL — ABNORMAL HIGH (ref 70–99)
Potassium: 3.7 mEq/L (ref 3.5–5.1)
Sodium: 138 mEq/L (ref 135–145)
Total Bilirubin: 0.4 mg/dL (ref 0.3–1.2)
Total Protein: 8.3 g/dL (ref 6.0–8.3)

## 2012-01-29 LAB — ACETAMINOPHEN LEVEL: Acetaminophen (Tylenol), Serum: <15 ug/mL (ref 10–30)

## 2012-01-29 MED ORDER — HYDROCODONE-ACETAMINOPHEN 5-325 MG PO TABS: 1.0000 | ORAL_TABLET | Freq: Four times a day (QID) | ORAL | Status: DC | PRN

## 2012-01-29 MED ORDER — MORPHINE SULFATE 4 MG/ML IJ SOLN
6.0000 mg | Freq: Once | INTRAMUSCULAR | Status: AC
  Administered 2012-01-29: 6 mg via INTRAMUSCULAR
  Filled 2012-01-29: qty 2

## 2012-01-29 MED ORDER — LORAZEPAM 1 MG PO TABS
1.0000 mg | ORAL_TABLET | Freq: Once | ORAL | Status: AC
  Administered 2012-01-29: 1 mg via ORAL
  Filled 2012-01-29: qty 1

## 2012-01-29 NOTE — ED Notes (Signed)
Pt laying on stretcher; gown provided

## 2012-01-29 NOTE — ED Notes (Signed)
Pt here with c/o of chronic back pain.  Pt states "I might have taken too much Tylenol PM last night".  Pt advises her back was hurting and she was only taking the tylenol PM for pain relief.  Pt currently denies trying to harm herself.

## 2012-01-29 NOTE — ED Provider Notes (Signed)
History     CSN: [id number]  Arrival date & time 01/29/12  0749   First MD Initiated Contact with Patient 01/29/12 [phone]      Chief Complaint  Patient presents with  . Back Pain    (Consider location/radiation/quality/duration/timing/severity/associated sxs/prior treatment) HPI Patient presents to the emergency department complaining of unremitting chronic back pain. Yesterday she missed her neurology appointment and has since run out of pain medication. Last night she took 12 Tylenol PM pills, trying to "sleep off the pain." She denies suicidal ideation. It has been 16 hours since her last extended release oxycontin 80 mg and 12 hours since her last immediate release oxycodone tablet. She is currently shivering, sweating, and reports vomiting this morning due to opioid withdrawal.   Past Medical History  Diagnosis Date  . Migraine   . GERD (gastroesophageal reflux disease)     protonix evenings  . Endometriosis     chronic pain  . Anxiety     klonipin for stress disorder  . Degenerative disc disease     Past Surgical History  Procedure Date  . Appendectomy   . Tonsillectomy   . Tubal ligation   . Laparoscopic assisted vaginal hysterectomy 08/19/2011    Procedure: LAPAROSCOPIC ASSISTED VAGINAL HYSTERECTOMY;  Surgeon: Miguel Aschoff, MD;  Location: [facility];  Service: Gynecology;  Laterality: N/A;  . Abdominal hysterectomy     Family History  Problem Relation Age of Onset  . Coronary artery disease Father   . Coronary artery disease Maternal Grandmother   . Breast cancer Mother   . Cancer Mother     History  Substance Use Topics  . Smoking status: Current Every Day Smoker -- 1.0 packs/day for 14 years  . Smokeless tobacco: Not on file  . Alcohol Use: No    OB History    Grav Para Term Preterm Abortions TAB SAB Ect Mult Living   6 2              Review of Systems All other systems negative except as documented in the HPI. All pertinent positives and negatives as  reviewed in the HPI.  Allergies  Epidural tray 17gx3-1-2"; Ibuprofen; Penicillins; and Zofran  Home Medications   Current Outpatient Rx  Name  Route  Sig  Dispense  Refill  . ARIPIPRAZOLE 2 MG PO TABS   Oral   Take 2 mg by mouth 2 (two) times daily before a meal.         . CLONAZEPAM 1 MG PO TABS   Oral   Take 0.5-1 mg by mouth 2 (two) times daily. Take 1 tablet every morning and take one-half tablet every evening.         Marland Kitchen CLONIDINE HCL 0.1 MG PO TABS   Oral   Take 0.1 mg by mouth 2 (two) times daily.         Marland Kitchen FLUOXETINE HCL 10 MG PO CAPS   Oral   Take 30 mg by mouth daily.         Marland Kitchen METHOCARBAMOL 500 MG PO TABS   Oral   Take 500 mg by mouth every 8 (eight) hours as needed. For muscle spasms.         Marland Kitchen MIRTAZAPINE 45 MG PO TABS   Oral   Take 45 mg by mouth at bedtime.         . OXYCODONE HCL ER 80 MG PO TB12   Oral   Take 80 mg by mouth every 12 (twelve)  hours.         . OXYCODONE HCL 15 MG PO TABS   Oral   Take 15 mg by mouth 2 (two) times daily as needed. For pain.         . OXYCODONE HCL 30 MG PO TABS   Oral   Take 30 mg by mouth every 4 (four) hours as needed. For pain.         Marland Kitchen PANTOPRAZOLE SODIUM 40 MG PO TBEC   Oral   Take 40 mg by mouth daily.         Marland Kitchen POLYETHYLENE GLYCOL 3350 PO PACK   Oral   Take 17 g by mouth 2 (two) times daily.           BP 110/75  Pulse 99  Temp 98.2 F (36.8 C) (Oral)  Resp 16  Ht 5\' 2"  (1.575 m)  Wt 149 lb (67.586 kg)  BMI 27.25 kg/m2  SpO2 98%  LMP 08/10/2011  Physical Exam  Constitutional: She is oriented to person, place, and time. She appears well-developed and well-nourished.  Non-toxic appearance. She does not have a sickly appearance. She does not appear ill. No distress.  HENT:  Head: Normocephalic and atraumatic.  Mouth/Throat: Oropharynx is clear and moist.  Eyes: Pupils are equal, round, and reactive to light.  Neck: Normal range of motion. Neck supple.  Cardiovascular:  Normal rate, regular rhythm and normal heart sounds.   Pulmonary/Chest: Effort normal and breath sounds normal. No respiratory distress.  Musculoskeletal:       Lumbar back: She exhibits tenderness and pain.  Neurological: She is alert and oriented to person, place, and time. She has normal reflexes. She displays normal reflexes. She exhibits normal muscle tone. Coordination normal.  Skin: Skin is warm and dry. No rash noted. No erythema.    ED Course  Procedures (including critical care time)   Labs Reviewed  ACETAMINOPHEN LEVEL  COMPREHENSIVE METABOLIC PANEL   The patient was looked up on the [place] [employer] data base and she has used 2 different spellings of her first name to obtain narcotic pain medications. The patient has a pain management doctor as well. The patient also has registered in our health system with different spellings of her first name and with 2 different SSN. The patient is advised she will need to see her PCP. She is taking high doses of pain medications and these will need to be managed on an outpatient basis. The patient is not in significant withdrawal at this time. The patient wants refills of all of pain medications.    MDM         Carlyle Dolly, PA-C 02/02/12 [phone]

## 2012-02-02 NOTE — ED Provider Notes (Signed)
Medical screening examination/treatment/procedure(s) were performed by non-physician practitioner and as supervising physician I was immediately available for consultation/collaboration.   Gwyneth Sprout, MD 02/02/12 1242

## 2012-05-01 ENCOUNTER — Encounter (HOSPITAL_COMMUNITY): Payer: Self-pay | Admitting: *Deleted

## 2012-05-01 ENCOUNTER — Emergency Department (HOSPITAL_COMMUNITY)
Admission: EM | Admit: 2012-05-01 | Discharge: 2012-05-02 | Disposition: A | Discharge status: Home / Self Care | Payer: Medicaid Other | Attending: Emergency Medicine | Admitting: Emergency Medicine

## 2012-05-01 DIAGNOSIS — M545 Low back pain, unspecified: Secondary | ICD-10-CM | POA: Insufficient documentation

## 2012-05-01 DIAGNOSIS — T43502A Poisoning by unspecified antipsychotics and neuroleptics, intentional self-harm, initial encounter: Secondary | ICD-10-CM | POA: Insufficient documentation

## 2012-05-01 DIAGNOSIS — G8929 Other chronic pain: Secondary | ICD-10-CM | POA: Insufficient documentation

## 2012-05-01 DIAGNOSIS — F411 Generalized anxiety disorder: Secondary | ICD-10-CM | POA: Insufficient documentation

## 2012-05-01 DIAGNOSIS — T50902A Poisoning by unspecified drugs, medicaments and biological substances, intentional self-harm, initial encounter: Secondary | ICD-10-CM

## 2012-05-01 DIAGNOSIS — Z3202 Encounter for pregnancy test, result negative: Secondary | ICD-10-CM | POA: Insufficient documentation

## 2012-05-01 DIAGNOSIS — F329 Major depressive disorder, single episode, unspecified: Secondary | ICD-10-CM

## 2012-05-01 DIAGNOSIS — F17200 Nicotine dependence, unspecified, uncomplicated: Secondary | ICD-10-CM | POA: Insufficient documentation

## 2012-05-01 DIAGNOSIS — IMO0002 Reserved for concepts with insufficient information to code with codable children: Secondary | ICD-10-CM | POA: Insufficient documentation

## 2012-05-01 DIAGNOSIS — T43204A Poisoning by unspecified antidepressants, undetermined, initial encounter: Secondary | ICD-10-CM | POA: Insufficient documentation

## 2012-05-01 DIAGNOSIS — G479 Sleep disorder, unspecified: Secondary | ICD-10-CM | POA: Insufficient documentation

## 2012-05-01 DIAGNOSIS — F39 Unspecified mood [affective] disorder: Secondary | ICD-10-CM | POA: Insufficient documentation

## 2012-05-01 DIAGNOSIS — Z8679 Personal history of other diseases of the circulatory system: Secondary | ICD-10-CM | POA: Insufficient documentation

## 2012-05-01 DIAGNOSIS — Z79899 Other long term (current) drug therapy: Secondary | ICD-10-CM | POA: Insufficient documentation

## 2012-05-01 DIAGNOSIS — N809 Endometriosis, unspecified: Secondary | ICD-10-CM | POA: Insufficient documentation

## 2012-05-01 DIAGNOSIS — K219 Gastro-esophageal reflux disease without esophagitis: Secondary | ICD-10-CM | POA: Insufficient documentation

## 2012-05-01 LAB — URINALYSIS, ROUTINE W REFLEX MICROSCOPIC
Glucose, UA: NEGATIVE mg/dL
Leukocytes, UA: NEGATIVE
Specific Gravity, Urine: 1.011 (ref 1.005–1.030)
pH: 7 (ref 5.0–8.0)

## 2012-05-01 LAB — CBC WITH DIFFERENTIAL/PLATELET
Eosinophils Relative: 1 % (ref 0–5)
Hemoglobin: 13.7 g/dL (ref 12.0–15.0)
Lymphocytes Relative: 29 % (ref 12–46)
Lymphs Abs: 2.7 10*3/uL (ref 0.7–4.0)
MCV: 85.6 fL (ref 78.0–100.0)
Monocytes Relative: 7 % (ref 3–12)
Neutrophils Relative %: 63 % (ref 43–77)
Platelets: 321 10*3/uL (ref 150–400)
RBC: 4.45 MIL/uL (ref 3.87–5.11)
WBC: 9.5 10*3/uL (ref 4.0–10.5)

## 2012-05-01 LAB — COMPREHENSIVE METABOLIC PANEL
ALT: 46 U/L — ABNORMAL HIGH (ref 0–35)
Alkaline Phosphatase: 126 U/L — ABNORMAL HIGH (ref 39–117)
BUN: 9 mg/dL (ref 6–23)
CO2: 24 mEq/L (ref 19–32)
GFR calc Af Amer: >90 mL/min (ref >90)
GFR calc non Af Amer: >90 mL/min (ref >90)
Glucose, Bld: 119 mg/dL — ABNORMAL HIGH (ref 70–99)
Potassium: 3.1 mEq/L — ABNORMAL LOW (ref 3.5–5.1)
Sodium: 134 mEq/L — ABNORMAL LOW (ref 135–145)

## 2012-05-01 LAB — RAPID URINE DRUG SCREEN, HOSP PERFORMED: Opiates: NOT DETECTED

## 2012-05-01 LAB — ETHANOL: Alcohol, Ethyl (B): <11 mg/dL (ref 0–11)

## 2012-05-01 LAB — POCT PREGNANCY, URINE: Preg Test, Ur: NEGATIVE

## 2012-05-01 MED ORDER — SODIUM CHLORIDE 0.9 % IV BOLUS (SEPSIS)
1000.0000 mL | Freq: Once | INTRAVENOUS | Status: AC
  Administered 2012-05-01: 1000 mL via INTRAVENOUS

## 2012-05-01 MED ORDER — OXYCODONE HCL ER 10 MG PO T12A
80.0000 mg | EXTENDED_RELEASE_TABLET | Freq: Two times a day (BID) | ORAL | Status: DC
  Administered 2012-05-01 – 2012-05-02 (×4): 80 mg via ORAL
  Filled 2012-05-01 (×4): qty 8

## 2012-05-01 MED ORDER — ONDANSETRON HCL 4 MG/2ML IJ SOLN
4.0000 mg | Freq: Once | INTRAMUSCULAR | Status: AC
  Administered 2012-05-01: 4 mg via INTRAVENOUS
  Filled 2012-05-01: qty 2

## 2012-05-01 MED ORDER — ZOLPIDEM TARTRATE 5 MG PO TABS
5.0000 mg | ORAL_TABLET | Freq: Every evening | ORAL | Status: DC | PRN
  Administered 2012-05-01: 5 mg via ORAL
  Filled 2012-05-01: qty 1

## 2012-05-01 MED ORDER — NICOTINE 21 MG/24HR TD PT24
21.0000 mg | MEDICATED_PATCH | Freq: Every day | TRANSDERMAL | Status: DC
  Administered 2012-05-01 – 2012-05-02 (×2): 21 mg via TRANSDERMAL
  Filled 2012-05-01: qty 1
  Filled 2012-05-01: qty 14
  Filled 2012-05-01: qty 1

## 2012-05-01 MED ORDER — ONDANSETRON HCL 8 MG PO TABS: 4.0000 mg | ORAL_TABLET | Freq: Three times a day (TID) | ORAL | Status: DC | PRN

## 2012-05-01 MED ORDER — FLUOXETINE HCL 20 MG PO CAPS
30.0000 mg | ORAL_CAPSULE | Freq: Every day | ORAL | Status: DC
  Administered 2012-05-01 – 2012-05-02 (×2): 30 mg via ORAL
  Filled 2012-05-01 (×3): qty 1

## 2012-05-01 MED ORDER — ALUM & MAG HYDROXIDE-SIMETH 200-200-20 MG/5ML PO SUSP: 30.0000 mL | ORAL | Status: DC | PRN

## 2012-05-01 MED ORDER — MORPHINE SULFATE ER 15 MG PO TBCR: 60.0000 mg | EXTENDED_RELEASE_TABLET | Freq: Two times a day (BID) | ORAL | Status: DC

## 2012-05-01 MED ORDER — CLONAZEPAM 0.5 MG PO TABS
0.5000 mg | ORAL_TABLET | Freq: Two times a day (BID) | ORAL | Status: DC
  Administered 2012-05-01: 1 mg via ORAL
  Administered 2012-05-02: 0.5 mg via ORAL
  Filled 2012-05-01: qty 2
  Filled 2012-05-01: qty 1

## 2012-05-01 MED ORDER — LORAZEPAM 1 MG PO TABS: 1.0000 mg | ORAL_TABLET | Freq: Three times a day (TID) | ORAL | Status: DC | PRN

## 2012-05-01 MED ORDER — ARIPIPRAZOLE 2 MG PO TABS
2.0000 mg | ORAL_TABLET | Freq: Two times a day (BID) | ORAL | Status: DC
  Administered 2012-05-01 – 2012-05-02 (×3): 2 mg via ORAL
  Filled 2012-05-01 (×5): qty 1

## 2012-05-01 MED ORDER — PANTOPRAZOLE SODIUM 40 MG PO TBEC
40.0000 mg | DELAYED_RELEASE_TABLET | Freq: Every day | ORAL | Status: DC
  Administered 2012-05-01 – 2012-05-02 (×2): 40 mg via ORAL
  Filled 2012-05-01 (×3): qty 1

## 2012-05-01 MED ORDER — ACETAMINOPHEN 325 MG PO TABS: 650.0000 mg | ORAL_TABLET | ORAL | Status: DC | PRN
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
## 2012-05-01 NOTE — ED Notes (Signed)
Called ac for sitter, no one available till 3, charge is aware. Security called to wand pt.

## 2012-05-01 NOTE — ED Provider Notes (Signed)
Medical screening examination/treatment/procedure(s) were performed by non-physician practitioner and as supervising physician I was immediately available for consultation/collaboration.   Celene Kras, MD 05/01/12 [phone]
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
## 2012-05-01 NOTE — ED Notes (Signed)
Pt reports being out of pain meds and has chronic back pain. Didn't want to live anymore so she took multiple trazadone last night to help her sleep, started taking them at 8pm and continued throughout the night, took around 15 pills, then another 5 this am 1000.

## 2012-05-01 NOTE — ED Notes (Addendum)
Patient inquired if she could go back to a room immediately due to feeling faint.  Patient was advised that she would have to return to waiting area due to no rooms being readily available.  Patient then stated that she "took sleeping pills because she wanted to die" and immediately inquired if that "would get her back faster".

## 2012-05-01 NOTE — ED Notes (Signed)
Pt brought to room from triage; pt changing into blue scrubs; triage RN has already called security so pt can be wanded

## 2012-05-02 MED ORDER — OXYCODONE-ACETAMINOPHEN 5-325 MG PO TABS
2.0000 | ORAL_TABLET | ORAL | Status: DC | PRN
  Administered 2012-05-02: 2 via ORAL
  Filled 2012-05-02: qty 2

## 2012-05-02 NOTE — [facility] Counselor (Signed)
Tele-psych recommendation for pt to be d/c'd to home. Pt does not meet criteria for inpatient treatment. Denice Bors, AADC 05/02/2012 2:31 PM

## 2012-05-02 NOTE — ED Notes (Signed)
telepsych consult se

## 2012-05-02 NOTE — ED Provider Notes (Signed)
Patient has been evaluated by psychiatry. It was felt that the patient's overdose was not significant and the patient is exhibiting any suicidality or homicidality. Patient can contract for safety. It was recommended the patient be discharged and followup with community health.  Gilda Crease, MD 05/02/12 [phone]

## 2012-05-02 NOTE — BH Assessment (Signed)
[facility] Assessment Progress Note  Per Jacquelyne Balint, RN, Administrative Coordinator, pt was reviewed with Assunta Found, FNP.  Shuvon believes that pt is not sufficiently medically stable at this time, due to hypokalemia.  She also believes, given pt's clinical presentation, that the pt would benefit from a tele-psychiatry consult to determine what her dispositional needs are at this time.  At 11:20 I called Ranae Pila, Assessment Counselor, to notify her.  Doylene Canning, MA Assessment Counselor 05/02/2012 @ 11:58

## 2012-05-02 NOTE — ED Provider Notes (Signed)
Patient presented with suicidal gesture and is awaiting possible inpatient psychiatric treatment for thrush and with suicidal ideation. Patient with history of chronic pain. She has been receiving OxyContin on a scheduled dose here but does not have any short acting medication prescribed for breakthrough pain. Will add Percocet.  Gilda Crease, MD 05/02/12 1055

## 2012-05-03 ENCOUNTER — Emergency Department (HOSPITAL_COMMUNITY)
Admission: EM | Admit: 2012-05-03 | Discharge: 2012-05-04 | Disposition: A | Discharge status: Home / Self Care | Payer: Medicaid Other | Attending: Emergency Medicine | Admitting: Emergency Medicine

## 2012-05-03 ENCOUNTER — Encounter (HOSPITAL_COMMUNITY): Payer: Self-pay

## 2012-05-03 DIAGNOSIS — IMO0001 Reserved for inherently not codable concepts without codable children: Secondary | ICD-10-CM | POA: Insufficient documentation

## 2012-05-03 DIAGNOSIS — Z8679 Personal history of other diseases of the circulatory system: Secondary | ICD-10-CM | POA: Insufficient documentation

## 2012-05-03 DIAGNOSIS — F329 Major depressive disorder, single episode, unspecified: Secondary | ICD-10-CM | POA: Insufficient documentation

## 2012-05-03 DIAGNOSIS — M549 Dorsalgia, unspecified: Secondary | ICD-10-CM

## 2012-05-03 DIAGNOSIS — Z8739 Personal history of other diseases of the musculoskeletal system and connective tissue: Secondary | ICD-10-CM | POA: Insufficient documentation

## 2012-05-03 DIAGNOSIS — M2540 Effusion, unspecified joint: Secondary | ICD-10-CM | POA: Insufficient documentation

## 2012-05-03 DIAGNOSIS — Z880 Allergy status to penicillin: Secondary | ICD-10-CM | POA: Insufficient documentation

## 2012-05-03 DIAGNOSIS — F1120 Opioid dependence, uncomplicated: Secondary | ICD-10-CM | POA: Insufficient documentation

## 2012-05-03 DIAGNOSIS — M545 Low back pain, unspecified: Secondary | ICD-10-CM | POA: Insufficient documentation

## 2012-05-03 DIAGNOSIS — F17200 Nicotine dependence, unspecified, uncomplicated: Secondary | ICD-10-CM | POA: Insufficient documentation

## 2012-05-03 DIAGNOSIS — F411 Generalized anxiety disorder: Secondary | ICD-10-CM | POA: Insufficient documentation

## 2012-05-03 DIAGNOSIS — Z79899 Other long term (current) drug therapy: Secondary | ICD-10-CM | POA: Insufficient documentation

## 2012-05-03 DIAGNOSIS — M2550 Pain in unspecified joint: Secondary | ICD-10-CM | POA: Insufficient documentation

## 2012-05-03 DIAGNOSIS — Z8742 Personal history of other diseases of the female genital tract: Secondary | ICD-10-CM | POA: Insufficient documentation

## 2012-05-03 DIAGNOSIS — R110 Nausea: Secondary | ICD-10-CM | POA: Insufficient documentation

## 2012-05-03 DIAGNOSIS — K219 Gastro-esophageal reflux disease without esophagitis: Secondary | ICD-10-CM | POA: Insufficient documentation

## 2012-05-03 DIAGNOSIS — G8929 Other chronic pain: Secondary | ICD-10-CM | POA: Insufficient documentation

## 2012-05-03 LAB — ETHANOL: Alcohol, Ethyl (B): <11 mg/dL (ref 0–11)

## 2012-05-03 LAB — COMPREHENSIVE METABOLIC PANEL
ALT: 23 U/L (ref 0–35)
AST: 16 U/L (ref 0–37)
Albumin: 3.3 g/dL — ABNORMAL LOW (ref 3.5–5.2)
Alkaline Phosphatase: 91 U/L (ref 39–117)
CO2: 25 mEq/L (ref 19–32)
Chloride: 102 mEq/L (ref 96–112)
Creatinine, Ser: 0.57 mg/dL (ref 0.50–1.10)
GFR calc non Af Amer: >90 mL/min (ref >90)
Potassium: 3.4 mEq/L — ABNORMAL LOW (ref 3.5–5.1)
Sodium: 137 mEq/L (ref 135–145)
Total Bilirubin: 0.2 mg/dL — ABNORMAL LOW (ref 0.3–1.2)

## 2012-05-03 LAB — CBC
Hemoglobin: 12.5 g/dL (ref 12.0–15.0)
MCHC: 35.9 g/dL (ref 30.0–36.0)
Platelets: 308 10*3/uL (ref 150–400)
RBC: 4.05 MIL/uL (ref 3.87–5.11)

## 2012-05-03 LAB — RAPID URINE DRUG SCREEN, HOSP PERFORMED: Benzodiazepines: NOT DETECTED

## 2012-05-03 LAB — URINALYSIS, ROUTINE W REFLEX MICROSCOPIC
Ketones, ur: NEGATIVE mg/dL
Leukocytes, UA: NEGATIVE
Nitrite: NEGATIVE
Urobilinogen, UA: 1 mg/dL (ref 0.0–1.0)
pH: 6.5 (ref 5.0–8.0)

## 2012-05-03 LAB — ACETAMINOPHEN LEVEL: Acetaminophen (Tylenol), Serum: <15 ug/mL (ref 10–30)

## 2012-05-03 LAB — SALICYLATE LEVEL: Salicylate Lvl: <2 mg/dL — ABNORMAL LOW (ref 2.8–20.0)

## 2012-05-03 MED ORDER — FLUOXETINE HCL 20 MG PO CAPS
30.0000 mg | ORAL_CAPSULE | Freq: Every day | ORAL | Status: DC
  Filled 2012-05-03: qty 1

## 2012-05-03 MED ORDER — OXYCODONE HCL ER 10 MG PO T12A
80.0000 mg | EXTENDED_RELEASE_TABLET | Freq: Two times a day (BID) | ORAL | Status: DC
  Administered 2012-05-03: 80 mg via ORAL
  Filled 2012-05-03: qty 8

## 2012-05-03 MED ORDER — CLONAZEPAM 0.5 MG PO TABS
0.5000 mg | ORAL_TABLET | Freq: Two times a day (BID) | ORAL | Status: DC
  Administered 2012-05-03: 0.5 mg via ORAL
  Filled 2012-05-03: qty 1

## 2012-05-03 MED ORDER — PANTOPRAZOLE SODIUM 40 MG PO TBEC: 40.0000 mg | DELAYED_RELEASE_TABLET | Freq: Every day | ORAL | Status: DC

## 2012-05-03 MED ORDER — ARIPIPRAZOLE 2 MG PO TABS
2.0000 mg | ORAL_TABLET | Freq: Two times a day (BID) | ORAL | Status: DC
  Filled 2012-05-03 (×3): qty 1

## 2012-05-03 NOTE — ED Notes (Signed)
Spoke with PA-C regarding pt's request to have her home meds administered and to have the pain medicine dosage increased. PA-C stated that medication scheduled every 12 hrs is adequate at this time.
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
## 2012-05-03 NOTE — ED Notes (Signed)
Telepsych placed in pt room
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
## 2012-05-03 NOTE — ED Notes (Signed)
PA-C talked with pt regarding possible discharge. Pt stated that she would not be suicidal if she could have prescriptions for Opana 80mg  and Oxycontin 80mg . Stated that she could not contract for safety without these prescriptions.

## 2012-05-03 NOTE — ED Notes (Signed)
ACT team at bedside.  
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
## 2012-05-04 NOTE — ED Notes (Signed)
MD at bedside. 
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
## 2012-05-04 NOTE — ED Notes (Signed)
Tele-psych paperwork faxed

## 2012-05-04 NOTE — ED Notes (Signed)
Pt very tearful after telepsych communication. Pt is requesting to speak to MD.

## 2012-05-10 ENCOUNTER — Encounter (HOSPITAL_COMMUNITY): Payer: Self-pay | Admitting: Emergency Medicine

## 2012-05-10 ENCOUNTER — Emergency Department (HOSPITAL_COMMUNITY)
Admission: EM | Admit: 2012-05-10 | Discharge: 2012-05-10 | Disposition: A | Discharge status: Home / Self Care | Payer: Medicaid Other | Attending: Emergency Medicine | Admitting: Emergency Medicine

## 2012-05-10 DIAGNOSIS — F1120 Opioid dependence, uncomplicated: Secondary | ICD-10-CM | POA: Insufficient documentation

## 2012-05-10 DIAGNOSIS — M549 Dorsalgia, unspecified: Secondary | ICD-10-CM

## 2012-05-10 DIAGNOSIS — K219 Gastro-esophageal reflux disease without esophagitis: Secondary | ICD-10-CM | POA: Insufficient documentation

## 2012-05-10 DIAGNOSIS — Z8739 Personal history of other diseases of the musculoskeletal system and connective tissue: Secondary | ICD-10-CM | POA: Insufficient documentation

## 2012-05-10 DIAGNOSIS — G8929 Other chronic pain: Secondary | ICD-10-CM | POA: Insufficient documentation

## 2012-05-10 DIAGNOSIS — Z79899 Other long term (current) drug therapy: Secondary | ICD-10-CM | POA: Insufficient documentation

## 2012-05-10 DIAGNOSIS — F411 Generalized anxiety disorder: Secondary | ICD-10-CM | POA: Insufficient documentation

## 2012-05-10 DIAGNOSIS — M545 Low back pain, unspecified: Secondary | ICD-10-CM | POA: Insufficient documentation

## 2012-05-10 DIAGNOSIS — Z8742 Personal history of other diseases of the female genital tract: Secondary | ICD-10-CM | POA: Insufficient documentation

## 2012-05-10 DIAGNOSIS — F17200 Nicotine dependence, unspecified, uncomplicated: Secondary | ICD-10-CM | POA: Insufficient documentation

## 2012-05-10 DIAGNOSIS — Z8679 Personal history of other diseases of the circulatory system: Secondary | ICD-10-CM | POA: Insufficient documentation

## 2012-05-10 MED ORDER — HYDROMORPHONE HCL PF 1 MG/ML IJ SOLN
1.0000 mg | Freq: Once | INTRAMUSCULAR | Status: AC
  Administered 2012-05-10: 1 mg via INTRAMUSCULAR
  Filled 2012-05-10: qty 1

## 2012-05-10 MED ORDER — OXYCODONE-ACETAMINOPHEN 5-325 MG PO TABS: 1.0000 | ORAL_TABLET | Freq: Four times a day (QID) | ORAL | Status: DC | PRN
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
## 2012-05-14 NOTE — ED Provider Notes (Signed)
History/physical exam/procedure(s) were performed by non-physician practitioner and as supervising physician I was immediately available for consultation/collaboration. I have reviewed all notes and am in agreement with care and plan.   Hilario Quarry, MD 05/14/12 [phone]

## 2012-05-21 NOTE — ED Provider Notes (Signed)
Medical screening examination/treatment/procedure(s) were performed by non-physician practitioner and as supervising physician I was immediately available for consultation/collaboration.   Gwyneth Sprout, MD 05/21/12 1400

## 2013-04-12 ENCOUNTER — Emergency Department (HOSPITAL_COMMUNITY)
Admission: EM | Admit: 2013-04-12 | Discharge: 2013-04-12 | Disposition: A | Discharge status: Home / Self Care | Payer: Medicaid Other | Attending: Emergency Medicine | Admitting: Emergency Medicine

## 2013-04-12 ENCOUNTER — Encounter (HOSPITAL_COMMUNITY): Payer: Self-pay | Admitting: Emergency Medicine

## 2013-04-12 DIAGNOSIS — Z8742 Personal history of other diseases of the female genital tract: Secondary | ICD-10-CM | POA: Insufficient documentation

## 2013-04-12 DIAGNOSIS — Z79899 Other long term (current) drug therapy: Secondary | ICD-10-CM | POA: Insufficient documentation

## 2013-04-12 DIAGNOSIS — R000 Tachycardia, unspecified: Secondary | ICD-10-CM | POA: Insufficient documentation

## 2013-04-12 DIAGNOSIS — F1120 Opioid dependence, uncomplicated: Secondary | ICD-10-CM | POA: Insufficient documentation

## 2013-04-12 DIAGNOSIS — F17200 Nicotine dependence, unspecified, uncomplicated: Secondary | ICD-10-CM | POA: Insufficient documentation

## 2013-04-12 DIAGNOSIS — G43909 Migraine, unspecified, not intractable, without status migrainosus: Secondary | ICD-10-CM | POA: Insufficient documentation

## 2013-04-12 DIAGNOSIS — IMO0002 Reserved for concepts with insufficient information to code with codable children: Secondary | ICD-10-CM | POA: Insufficient documentation

## 2013-04-12 DIAGNOSIS — M549 Dorsalgia, unspecified: Secondary | ICD-10-CM | POA: Insufficient documentation

## 2013-04-12 DIAGNOSIS — Z880 Allergy status to penicillin: Secondary | ICD-10-CM | POA: Insufficient documentation

## 2013-04-12 DIAGNOSIS — K219 Gastro-esophageal reflux disease without esophagitis: Secondary | ICD-10-CM | POA: Insufficient documentation

## 2013-04-12 DIAGNOSIS — F411 Generalized anxiety disorder: Secondary | ICD-10-CM | POA: Insufficient documentation

## 2013-04-12 MED ORDER — DIPHENHYDRAMINE HCL 50 MG/ML IJ SOLN
25.0000 mg | Freq: Once | INTRAMUSCULAR | Status: AC
  Administered 2013-04-12: 25 mg via INTRAVENOUS
  Filled 2013-04-12: qty 1

## 2013-04-12 MED ORDER — FENTANYL CITRATE 0.05 MG/ML IJ SOLN
100.0000 ug | Freq: Once | INTRAMUSCULAR | Status: AC
  Administered 2013-04-12: 100 ug via INTRAVENOUS
  Filled 2013-04-12: qty 2

## 2013-04-12 MED ORDER — METOCLOPRAMIDE HCL 5 MG/ML IJ SOLN
10.0000 mg | Freq: Once | INTRAMUSCULAR | Status: AC
  Administered 2013-04-12: 10 mg via INTRAVENOUS
  Filled 2013-04-12: qty 2

## 2013-04-12 NOTE — Discharge Instructions (Signed)
Please read and follow all provided instructions.  Your diagnoses today include:  1. Migraine   2. Opioid dependence     Tests performed today include:  Vital signs. See below for your results today.   Medications:  In the Emergency Department you received:  Reglan - antinausea/headache medication  Benadryl - antihistamine to counteract potential side effects of reglan  Fentanyl - narcotic pain medication  Take any prescribed medications only as directed.  Additional information:  Follow any educational materials contained in this packet.  You are having a headache. No specific cause was found today for your headache. It may have been a migraine or other cause of headache. Stress, anxiety, fatigue, and depression are common triggers for headaches.   Your headache today does not appear to be life-threatening or require hospitalization, but often the exact cause of headaches is not determined in the emergency department. Therefore, follow-up with your doctor is very important to find out what may have caused your headache and whether or not you need any further diagnostic testing or treatment.   Sometimes headaches can appear benign (not harmful), but then more serious symptoms can develop which should prompt an immediate re-evaluation by your doctor or the emergency department.  BE VERY CAREFUL not to take multiple medicines containing Tylenol (also called acetaminophen). Doing so can lead to an overdose which can damage your liver and cause liver failure and possibly death.   Follow-up instructions: Please follow-up with your pain management doctor tomorrow as planned.    Return instructions:   Please return to the Emergency Department if you experience worsening symptoms.  Return if the medications do not resolve your headache, if it recurs, or if you have multiple episodes of vomiting or cannot keep down fluids.  Return if you have a change from the usual  headache.  RETURN IMMEDIATELY IF you:  Develop a sudden, severe headache  Develop confusion or become poorly responsive or faint  Develop a fever above 100.89F or problem breathing  Have a change in speech, vision, swallowing, or understanding  Develop new weakness, numbness, tingling, incoordination in your arms or legs  Have a seizure  Please return if you have any other emergent concerns.  Additional Information:  Your vital signs today were: BP 137/77   Pulse 117   Resp 16   SpO2 95%   LMP 08/10/2011 If your blood pressure (BP) was elevated above 135/85 this visit, please have this repeated by your doctor within one month. --------------

## 2013-04-12 NOTE — ED Notes (Addendum)
Pt states she has had migraine since Sunday. Pt has hx of migraines. Pt states she also has back pain

## 2013-04-12 NOTE — ED Provider Notes (Signed)
CSN: [id number]     Arrival date & time 04/12/13  1409 History  This chart was scribed for non-physician practitioner, Alecia Lemming, PA-C working with Jasper Riling. Alvino Chapel, MD by Frederich Balding, ED scribe. This patient was seen in room WTR5/WTR5 and the patient's care was started at 2:48 PM.   Chief Complaint  Patient presents with  . Migraine   The history is provided by the patient. No language interpreter was used.   HPI Comments: [name] [name] is a 33 y.o. female with history of migraines and degenerative disc disease who presents to the Emergency Department complaining of gradual onset, constant throbbing migraine with associated nausea, emesis and photophobia that started 2 days ago. Her last episode of emesis was a few hours ago. Pt states she has started to have spasming back pain because she can't keep any of her pain medications down. She was recently switched to Opana (taken off of fentanyl patch) and is unsure if that is what caused her migraine. Pt has used ice and taken Excedrin migraine with little relief of pain or migraine. Denies fever, visual disturbance, abdominal pain.    Past Medical History  Diagnosis Date  . Migraine   . GERD (gastroesophageal reflux disease)     protonix evenings  . Endometriosis     chronic pain  . Anxiety     klonipin for stress disorder  . Degenerative disc disease    Past Surgical History  Procedure Laterality Date  . Appendectomy    . Tonsillectomy    . Tubal ligation    . Laparoscopic assisted vaginal hysterectomy  08/19/2011    Procedure: LAPAROSCOPIC ASSISTED VAGINAL HYSTERECTOMY;  Surgeon: Gus Height, MD;  Location: [facility];  Service: Gynecology;  Laterality: N/A;  . Abdominal hysterectomy     Family History  Problem Relation Age of Onset  . Coronary artery disease Father   . Coronary artery disease Maternal Grandmother   . Breast cancer Mother   . Cancer Mother    History  Substance Use Topics  . Smoking status: Current Every Day  Smoker -- 1.00 packs/day for 14 years  . Smokeless tobacco: Not on file  . Alcohol Use: No   OB History   Grav Para Term Preterm Abortions TAB SAB Ect Mult Living   6 2             Review of Systems  Constitutional: Negative for fever.  HENT: Negative for congestion, dental problem, rhinorrhea and sinus pressure.   Eyes: Positive for photophobia. Negative for discharge, redness and visual disturbance.  Respiratory: Negative for shortness of breath.   Cardiovascular: Negative for chest pain.  Gastrointestinal: Positive for nausea and vomiting. Negative for abdominal pain.  Musculoskeletal: Positive for back pain. Negative for gait problem, neck pain and neck stiffness.  Skin: Negative for rash.  Neurological: Positive for headaches. Negative for syncope, speech difficulty, weakness, light-headedness and numbness.  Psychiatric/Behavioral: Negative for confusion.   Allergies  Epidural tray 17gx3-1-2"; Ibuprofen; Penicillins; and Zofran  Home Medications   Current Outpatient Rx  Name  Route  Sig  Dispense  Refill  . ARIPiprazole (ABILIFY) 2 MG tablet   Oral   Take 2 mg by mouth 2 (two) times daily before a meal.         . clonazePAM (KLONOPIN) 1 MG tablet   Oral   Take 0.5-1 mg by mouth 2 (two) times daily. Take 1 tablet every morning and take one-half tablet every evening.         Marland Kitchen  FLUoxetine (PROZAC) 10 MG capsule   Oral   Take 30 mg by mouth daily.         Marland Kitchen oxyCODONE-acetaminophen (PERCOCET/ROXICET) 5-325 MG per tablet   Oral   Take 1 tablet by mouth every 6 (six) hours as needed for pain.   9 tablet   0   . oxymorphone (OPANA ER) 40 MG 12 hr tablet   Oral   Take 40 mg by mouth every 12 (twelve) hours.         Marland Kitchen oxymorphone (OPANA) 5 MG tablet   Oral   Take 5 mg by mouth every 4 (four) hours as needed for pain.         . pantoprazole (PROTONIX) 40 MG tablet   Oral   Take 40 mg by mouth daily.         . polyethylene glycol (MIRALAX / GLYCOLAX)  packet   Oral   Take 17 g by mouth 2 (two) times daily as needed (constipation).           BP 137/77  Pulse 117  Resp 16  SpO2 95%  LMP 08/10/2011  Physical Exam  Nursing note and vitals reviewed. Constitutional: She is oriented to person, place, and time. She appears well-developed and well-nourished. No distress.  HENT:  Head: Normocephalic and atraumatic.  Right Ear: Tympanic membrane, external ear and ear canal normal.  Left Ear: Tympanic membrane, external ear and ear canal normal.  Nose: Nose normal.  Mouth/Throat: Uvula is midline, oropharynx is clear and moist and mucous membranes are normal.  Eyes: Conjunctivae, EOM and lids are normal. Pupils are equal, round, and reactive to light. Right eye exhibits no nystagmus. Left eye exhibits no nystagmus.  Neck: Normal range of motion. Neck supple. No tracheal deviation present.  Cardiovascular: Regular rhythm.  Tachycardia present.   Pulmonary/Chest: Effort normal and breath sounds normal. No respiratory distress.  Abdominal: Soft. There is no tenderness.  Musculoskeletal: Normal range of motion.       Cervical back: She exhibits normal range of motion, no tenderness and no bony tenderness.  Neurological: She is alert and oriented to person, place, and time. She has normal strength and normal reflexes. No cranial nerve deficit or sensory deficit. She displays a negative Romberg sign. Coordination and gait normal. GCS eye subscore is 4. GCS verbal subscore is 5. GCS motor subscore is 6.  Skin: Skin is warm and dry.  Psychiatric: She has a normal mood and affect. Her behavior is normal.    ED Course  Procedures (including critical care time)  DIAGNOSTIC STUDIES: Oxygen Saturation is 95% on RA, adequate by my interpretation.    COORDINATION OF CARE: 2:54 PM-Discussed treatment plan which includes IV medications with pt at bedside and pt agreed to plan.   Labs Review Labs Reviewed - No data to display Imaging Review No  results found.   EKG Interpretation None      3:11 PM Patient seen and examined. Medications ordered. Pt to be discharged when pain medication given.   Vital signs reviewed and are as follows: Filed Vitals:   04/12/13 1511  BP:   Pulse:   Temp: 98.8 F (37.1 C)  Resp:   BP 137/77  Pulse 117  Temp(Src) 98.8 F (37.1 C) (Oral)  Resp 16  SpO2 95%  LMP 08/10/2011  Patient counseled to return if they have weakness in their arms or legs, slurred speech, trouble walking or talking, confusion, trouble with their balance, or if they have  any other concerns. Patient verbalizes understanding and agrees with plan.   MDM   Final diagnoses:  Migraine  Opioid dependence   Patient with history of migraine headache, similar to previous, also with element of narcotics withdrawal after recent medication change.  Patient without high-risk features of headache including: sudden onset/thunderclap HA, no similar headache in past, altered mental status, accompanying seizure, headache with exertion, age > 4, history of immunocompromised, neck or shoulder pain, fever, use of anticoagulation, family history of spontaneous SAH, concomitant drug use, toxic exposure.   Patient has a normal complete neurological exam, normal vital signs, normal level of consciousness, no signs of meningismus, is well-appearing/non-toxic appearing, no signs of trauma.  Imaging with CT/MRI not indicated given history and physical exam findings.   No dangerous or life-threatening conditions suspected or identified by history, physical exam, and by work-up. No indications for hospitalization identified.   I personally performed the services described in this documentation, which was scribed in my presence. The recorded information has been reviewed and is accurate.   Carlisle Cater, PA-C 04/12/13 1513

## 2013-04-14 NOTE — ED Provider Notes (Signed)
Medical screening examination/treatment/procedure(s) were performed by non-physician practitioner and as supervising physician I was immediately available for consultation/collaboration.   EKG Interpretation None       Jasper Riling. Alvino Chapel, MD 04/14/13 1252

## 2013-05-27 ENCOUNTER — Emergency Department (HOSPITAL_COMMUNITY)
Admission: EM | Admit: 2013-05-27 | Discharge: 2013-05-27 | Disposition: A | Discharge status: Home / Self Care | Payer: Medicaid Other | Attending: Emergency Medicine | Admitting: Emergency Medicine

## 2013-05-27 ENCOUNTER — Encounter (HOSPITAL_COMMUNITY): Payer: Self-pay | Admitting: Emergency Medicine

## 2013-05-27 ENCOUNTER — Emergency Department (HOSPITAL_COMMUNITY): Payer: Medicaid Other

## 2013-05-27 DIAGNOSIS — S39012A Strain of muscle, fascia and tendon of lower back, initial encounter: Secondary | ICD-10-CM

## 2013-05-27 DIAGNOSIS — F17200 Nicotine dependence, unspecified, uncomplicated: Secondary | ICD-10-CM | POA: Insufficient documentation

## 2013-05-27 DIAGNOSIS — G8929 Other chronic pain: Secondary | ICD-10-CM | POA: Insufficient documentation

## 2013-05-27 DIAGNOSIS — F411 Generalized anxiety disorder: Secondary | ICD-10-CM | POA: Insufficient documentation

## 2013-05-27 DIAGNOSIS — K219 Gastro-esophageal reflux disease without esophagitis: Secondary | ICD-10-CM | POA: Insufficient documentation

## 2013-05-27 DIAGNOSIS — S335XXA Sprain of ligaments of lumbar spine, initial encounter: Secondary | ICD-10-CM | POA: Insufficient documentation

## 2013-05-27 DIAGNOSIS — G43909 Migraine, unspecified, not intractable, without status migrainosus: Secondary | ICD-10-CM | POA: Insufficient documentation

## 2013-05-27 DIAGNOSIS — F329 Major depressive disorder, single episode, unspecified: Secondary | ICD-10-CM | POA: Insufficient documentation

## 2013-05-27 DIAGNOSIS — Z8742 Personal history of other diseases of the female genital tract: Secondary | ICD-10-CM | POA: Insufficient documentation

## 2013-05-27 DIAGNOSIS — Y92410 Unspecified street and highway as the place of occurrence of the external cause: Secondary | ICD-10-CM | POA: Insufficient documentation

## 2013-05-27 DIAGNOSIS — Y9389 Activity, other specified: Secondary | ICD-10-CM | POA: Insufficient documentation

## 2013-05-27 DIAGNOSIS — F3289 Other specified depressive episodes: Secondary | ICD-10-CM | POA: Insufficient documentation

## 2013-05-27 DIAGNOSIS — Z880 Allergy status to penicillin: Secondary | ICD-10-CM | POA: Insufficient documentation

## 2013-05-27 DIAGNOSIS — Z79899 Other long term (current) drug therapy: Secondary | ICD-10-CM | POA: Insufficient documentation

## 2013-05-27 DIAGNOSIS — S060XAA Concussion with loss of consciousness status unknown, initial encounter: Secondary | ICD-10-CM

## 2013-05-27 DIAGNOSIS — S060X9A Concussion with loss of consciousness of unspecified duration, initial encounter: Secondary | ICD-10-CM | POA: Insufficient documentation

## 2013-05-27 MED ORDER — HYDROMORPHONE HCL PF 1 MG/ML IJ SOLN
1.0000 mg | Freq: Once | INTRAMUSCULAR | Status: AC
  Administered 2013-05-27: 1 mg via INTRAMUSCULAR
  Filled 2013-05-27: qty 1

## 2013-05-27 MED ORDER — ORPHENADRINE CITRATE 30 MG/ML IJ SOLN
60.0000 mg | Freq: Once | INTRAMUSCULAR | Status: AC
  Administered 2013-05-27: 60 mg via INTRAMUSCULAR
  Filled 2013-05-27: qty 2

## 2013-05-27 NOTE — ED Notes (Signed)
Radiology contacted to perform CT scan
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
## 2013-05-27 NOTE — Discharge Instructions (Signed)
Take tylenol every 4 hours as needed (15 mg per kg) and take motrin (ibuprofen) every 6 hours as needed for fever or pain (10 mg per kg). Return for any changes, weird rashes, neck stiffness, change in behavior, new or worsening concerns.  Follow up with your physician as directed. Thank you Filed Vitals:   05/27/13 0826  BP: 130/83  Pulse: 96  Temp: 98.2 F (36.8 C)  TempSrc: Oral  Resp: 16  SpO2: 97%
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
## 2013-05-30 ENCOUNTER — Encounter (HOSPITAL_COMMUNITY): Payer: Self-pay | Admitting: Emergency Medicine

## 2013-05-30 ENCOUNTER — Emergency Department (HOSPITAL_COMMUNITY)
Admission: EM | Admit: 2013-05-30 | Discharge: 2013-05-30 | Disposition: A | Discharge status: Home / Self Care | Payer: Medicaid Other | Attending: Emergency Medicine | Admitting: Emergency Medicine

## 2013-05-30 DIAGNOSIS — K219 Gastro-esophageal reflux disease without esophagitis: Secondary | ICD-10-CM | POA: Insufficient documentation

## 2013-05-30 DIAGNOSIS — Z79899 Other long term (current) drug therapy: Secondary | ICD-10-CM | POA: Insufficient documentation

## 2013-05-30 DIAGNOSIS — G43909 Migraine, unspecified, not intractable, without status migrainosus: Secondary | ICD-10-CM | POA: Insufficient documentation

## 2013-05-30 DIAGNOSIS — G8929 Other chronic pain: Secondary | ICD-10-CM | POA: Insufficient documentation

## 2013-05-30 DIAGNOSIS — G8911 Acute pain due to trauma: Secondary | ICD-10-CM | POA: Insufficient documentation

## 2013-05-30 DIAGNOSIS — M545 Low back pain, unspecified: Secondary | ICD-10-CM | POA: Insufficient documentation

## 2013-05-30 DIAGNOSIS — F411 Generalized anxiety disorder: Secondary | ICD-10-CM | POA: Insufficient documentation

## 2013-05-30 DIAGNOSIS — Z880 Allergy status to penicillin: Secondary | ICD-10-CM | POA: Insufficient documentation

## 2013-05-30 DIAGNOSIS — Z8742 Personal history of other diseases of the female genital tract: Secondary | ICD-10-CM | POA: Insufficient documentation

## 2013-05-30 DIAGNOSIS — F17200 Nicotine dependence, unspecified, uncomplicated: Secondary | ICD-10-CM | POA: Insufficient documentation

## 2013-05-30 MED ORDER — HYDROMORPHONE HCL PF 1 MG/ML IJ SOLN
1.0000 mg | Freq: Once | INTRAMUSCULAR | Status: AC
  Administered 2013-05-30: 1 mg via INTRAMUSCULAR
  Filled 2013-05-30: qty 1

## 2013-05-30 MED ORDER — PREDNISONE 20 MG PO TABS: 40.0000 mg | ORAL_TABLET | Freq: Every day | ORAL | Status: DC

## 2013-05-30 NOTE — ED Notes (Signed)
Pt states she has a ride home. 
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
## 2013-05-30 NOTE — ED Notes (Signed)
Pt ambulatory to exam room with steady gait. Pt tearful.

## 2013-05-30 NOTE — Discharge Instructions (Signed)
Followup with your Primary Care Physician. Use conservative methods at home including heat therapy and cold therapy as we discussed. More information on cold therapy is listed below.  It is not recommended to use heat treatment directly after an acute injury.  SEEK IMMEDIATE MEDICAL ATTENTION IF: New numbness, tingling, weakness, or problem with the use of your arms or legs.  Severe back pain not relieved with medications.  Change in bowel or bladder control.  Increasing pain in any areas of the body (such as chest or abdominal pain).  Shortness of breath, dizziness or fainting.  Nausea (feeling sick to your stomach), vomiting, fever, or sweats.  COLD THERAPY DIRECTIONS:  Ice or gel packs can be used to reduce both pain and swelling. Ice is the most helpful within the first 24 to 48 hours after an injury or flareup from overusing a muscle or joint.  Ice is effective, has very few side effects, and is safe for most people to use.   If you expose your skin to cold temperatures for too long or without the proper protection, you can damage your skin or nerves. Watch for signs of skin damage due to cold.   HOME CARE INSTRUCTIONS  Follow these tips to use ice and cold packs safely.  Place a dry or damp towel between the ice and skin. A damp towel will cool the skin more quickly, so you may need to shorten the time that the ice is used.  For a more rapid response, add gentle compression to the ice.  Ice for no more than 10 to 20 minutes at a time. The bonier the area you are icing, the less time it will take to get the benefits of ice.  Check your skin after 5 minutes to make sure there are no signs of a poor response to cold or skin damage.  Rest 20 minutes or more in between uses.  Once your skin is numb, you can end your treatment. You can test numbness by very lightly touching your skin. The touch should be so light that you do not see the skin dimple from the pressure of your fingertip. When  using ice, most people will feel these normal sensations in this order: cold, burning, aching, and numbness.  Do not use ice on someone who cannot communicate their responses to pain, such as small children or people with dementia.   HOW TO MAKE AN ICE PACK  To make an ice pack, do one of the following:  Place crushed ice or a bag of frozen vegetables in a sealable plastic bag. Squeeze out the excess air. Place this bag inside another plastic bag. Slide the bag into a pillowcase or place a damp towel between your skin and the bag.  Mix 3 parts water with 1 part rubbing alcohol. Freeze the mixture in a sealable plastic bag. When you remove the mixture from the freezer, it will be slushy. Squeeze out the excess air. Place this bag inside another plastic bag. Slide the bag into a pillowcase or place a damp towel between your skin and the bag.   SEEK MEDICAL CARE IF:  You develop white spots on your skin. This may give the skin a blotchy (mottled) appearance.  Your skin turns blue or pale.  Your skin becomes waxy or hard.  Your swelling gets worse.  MAKE SURE YOU:  Understand these instructions.  Will watch your condition.  Will get help right away if you are not doing well or  get worse.  ° ° °Chronic Pain Discharge Instructions  °Emergency care providers appreciate that many patients coming to us are in severe pain and we wish to address their pain in the safest, most responsible manner.  It is important to recognize however, that the proper treatment of chronic pain differs from that of the pain of injuries and acute illnesses.  Our goal is to provide quality, safe, personalized care and we thank you for giving us the opportunity to serve you. °The use of narcotics and related agents for chronic pain syndromes may lead to additional physical and psychological problems.  Nearly as many people die from prescription narcotics each year as die from car crashes.  Additionally, this risk is increased if such  prescriptions are obtained from a variety of sources.  Therefore, only your primary care physician or a pain management specialist is able to safely treat such syndromes with narcotic medications long-term.   ° °Documentation revealing such prescriptions have been sought from multiple sources may prohibit us from providing a refill or different narcotic medication.  Your name may be checked first through the [place] Controlled Substances Reporting System.  This database is a record of controlled substance medication prescriptions that the patient has received.  This has been established by [facility] in an effort to eliminate the dangerous, and often life threatening, practice of obtaining multiple prescriptions from different medical providers.  ° °If you have a chronic pain syndrome (i.e. chronic headaches, recurrent back or neck pain, dental pain, abdominal or pelvis pain without a specific diagnosis, or neuropathic pain such as fibromyalgia) or recurrent visits for the same condition without an acute diagnosis, you may be treated with non-narcotics and other non-addictive medicines.  Allergic reactions or negative side effects that may be reported by a patient to such medications will not typically lead to the use of a narcotic analgesic or other controlled substance as an alternative. °  °Patients managing chronic pain with a personal physician should have provisions in place for breakthrough pain.  If you are in crisis, you should call your physician.  If your physician directs you to the emergency department, please have the doctor call and speak to our attending physician concerning your care. °  °When patients come to the Emergency Department (ED) with acute medical conditions in which the Emergency Department physician feels appropriate to prescribe narcotic or sedating pain medication, the physician will prescribe these in very limited quantities.  The amount of these medications will last only  until you can see your primary care physician in his/her office.  Any patient who returns to the ED seeking refills should expect only non-narcotic pain medications.  ° °In the event of an acute medical condition exists and the emergency physician feels it is necessary that the patient be given a narcotic or sedating medication -  a responsible adult driver should be present in the room prior to the medication being given by the nurse. °  °Prescriptions for narcotic or sedating medications that have been lost, stolen or expired will not be refilled in the Emergency Department.   ° °Patients who have chronic pain may receive non-narcotic prescriptions until seen by their primary care physician.  It is every patient’s personal responsibility to maintain active prescriptions with his or her primary care physician or specialist. ° ° ° °

## 2013-06-02 NOTE — ED Provider Notes (Signed)
Medical screening examination/treatment/procedure(s) were performed by non-physician practitioner and as supervising physician I was immediately available for consultation/collaboration.    Hardin Hardenbrook R Shelonda Saxe, MD 06/02/13 0023 

## 2013-06-26 ENCOUNTER — Encounter (HOSPITAL_COMMUNITY): Payer: Self-pay | Admitting: Emergency Medicine

## 2013-06-26 ENCOUNTER — Emergency Department (HOSPITAL_COMMUNITY)
Admission: EM | Admit: 2013-06-26 | Discharge: 2013-06-27 | Disposition: A | Discharge status: Home / Self Care | Payer: Medicaid Other | Attending: Emergency Medicine | Admitting: Emergency Medicine

## 2013-06-26 DIAGNOSIS — K219 Gastro-esophageal reflux disease without esophagitis: Secondary | ICD-10-CM | POA: Insufficient documentation

## 2013-06-26 DIAGNOSIS — F17200 Nicotine dependence, unspecified, uncomplicated: Secondary | ICD-10-CM | POA: Insufficient documentation

## 2013-06-26 DIAGNOSIS — K0889 Other specified disorders of teeth and supporting structures: Secondary | ICD-10-CM

## 2013-06-26 DIAGNOSIS — F411 Generalized anxiety disorder: Secondary | ICD-10-CM | POA: Insufficient documentation

## 2013-06-26 DIAGNOSIS — R221 Localized swelling, mass and lump, neck: Secondary | ICD-10-CM

## 2013-06-26 DIAGNOSIS — R220 Localized swelling, mass and lump, head: Secondary | ICD-10-CM | POA: Insufficient documentation

## 2013-06-26 DIAGNOSIS — Z880 Allergy status to penicillin: Secondary | ICD-10-CM | POA: Insufficient documentation

## 2013-06-26 DIAGNOSIS — Z79899 Other long term (current) drug therapy: Secondary | ICD-10-CM | POA: Insufficient documentation

## 2013-06-26 DIAGNOSIS — K089 Disorder of teeth and supporting structures, unspecified: Secondary | ICD-10-CM | POA: Insufficient documentation

## 2013-06-26 DIAGNOSIS — J029 Acute pharyngitis, unspecified: Secondary | ICD-10-CM

## 2013-06-26 DIAGNOSIS — G43909 Migraine, unspecified, not intractable, without status migrainosus: Secondary | ICD-10-CM | POA: Insufficient documentation

## 2013-06-26 DIAGNOSIS — Z8742 Personal history of other diseases of the female genital tract: Secondary | ICD-10-CM | POA: Insufficient documentation

## 2013-06-26 DIAGNOSIS — Z8739 Personal history of other diseases of the musculoskeletal system and connective tissue: Secondary | ICD-10-CM | POA: Insufficient documentation

## 2013-06-26 NOTE — ED Notes (Signed)
Per pt report: pt c/o dental pain on her right upper mouth. Pt put ice to help with the pain. Pt reports her face is swollen on her right side. Pt reports that side of her neck is tight and sore. Pt reports it's painful swallow on the right side.

## 2013-06-27 ENCOUNTER — Encounter (HOSPITAL_COMMUNITY): Payer: Self-pay

## 2013-06-27 ENCOUNTER — Emergency Department (HOSPITAL_COMMUNITY): Payer: Medicaid Other

## 2013-06-27 LAB — I-STAT CREATININE, ED: CREATININE: 0.7 mg/dL (ref 0.50–1.10)

## 2013-06-27 MED ORDER — IOHEXOL 300 MG/ML  SOLN
100.0000 mL | Freq: Once | INTRAMUSCULAR | Status: AC | PRN
  Administered 2013-06-27: 100 mL via INTRAVENOUS

## 2013-06-27 MED ORDER — CLINDAMYCIN HCL 150 MG PO CAPS: 300.0000 mg | ORAL_CAPSULE | Freq: Three times a day (TID) | ORAL | Status: DC

## 2013-06-27 MED ORDER — HYDROMORPHONE HCL PF 1 MG/ML IJ SOLN
1.0000 mg | Freq: Once | INTRAMUSCULAR | Status: AC
  Administered 2013-06-27: 1 mg via INTRAVENOUS
  Filled 2013-06-27: qty 1

## 2013-06-27 MED ORDER — CLINDAMYCIN PHOSPHATE 600 MG/50ML IV SOLN
600.0000 mg | Freq: Once | INTRAVENOUS | Status: AC
  Administered 2013-06-27: 600 mg via INTRAVENOUS
  Filled 2013-06-27: qty 50

## 2013-06-27 MED ORDER — SODIUM CHLORIDE 0.9 % IV BOLUS (SEPSIS)
1000.0000 mL | Freq: Once | INTRAVENOUS | Status: AC
  Administered 2013-06-27: 1000 mL via INTRAVENOUS

## 2013-06-27 NOTE — ED Provider Notes (Signed)
CSN: [id number]     Arrival date & time 06/26/13  2248 History   First MD Initiated Contact with Patient 06/27/13 0047     Chief Complaint  Patient presents with  . Dental Pain     (Consider location/radiation/quality/duration/timing/severity/associated sxs/prior Treatment) HPI Comments: 33 year old female presents to the emergency department for right upper dental pain x2 days. Pain has been constant and worsening. She describes the pain as a sharp throbbing sensation. Patient states that she has been putting ice on the area without significant relief. She states that symptoms have been associated with mild right-sided facial swelling as well as sore throat and right anterior neck tightness. Patient also states that she had a temperature of 101F yesterday. Patient denies drooling or inability to swallow, shortness of breath, oral bleeding or lesions, trauma or injury to her mouth, chest pain, shortness of breath, and syncope. She states she does not have a dentist for followup.  The history is provided by the patient. No language interpreter was used.    Past Medical History  Diagnosis Date  . Migraine   . GERD (gastroesophageal reflux disease)     protonix evenings  . Endometriosis     chronic pain  . Anxiety     klonipin for stress disorder  . Degenerative disc disease    Past Surgical History  Procedure Laterality Date  . Appendectomy    . Tonsillectomy    . Tubal ligation    . Laparoscopic assisted vaginal hysterectomy  08/19/2011    Procedure: LAPAROSCOPIC ASSISTED VAGINAL HYSTERECTOMY;  Surgeon: Gus Height, MD;  Location: [facility];  Service: Gynecology;  Laterality: N/A;  . Abdominal hysterectomy     Family History  Problem Relation Age of Onset  . Coronary artery disease Father   . Coronary artery disease Maternal Grandmother   . Breast cancer Mother   . Cancer Mother    History  Substance Use Topics  . Smoking status: Current Every Day Smoker -- 1.00 packs/day for 14  years  . Smokeless tobacco: Not on file  . Alcohol Use: No   OB History   Grav Para Term Preterm Abortions TAB SAB Ect Mult Living   6 2              Review of Systems  Constitutional: Positive for fever.  HENT: Positive for dental problem, facial swelling and sore throat. Negative for drooling and trouble swallowing.   Respiratory: Negative for shortness of breath.   Cardiovascular: Negative for chest pain.  Musculoskeletal: Positive for neck stiffness.  Neurological: Negative for syncope.  All other systems reviewed and are negative.    Allergies  Epidural tray [id number]"; Ibuprofen; Penicillins; and Zofran  Home Medications   Prior to Admission medications   Medication Sig Start Date End Date Taking? Authorizing Provider  acetaminophen (TYLENOL) 500 MG tablet Take 2,000 mg by mouth every 6 (six) hours as needed for mild pain or headache.   Yes Historical Provider, MD  clonazePAM (KLONOPIN) 1 MG tablet Take 0.5-1 mg by mouth 3 (three) times daily.    Yes Historical Provider, MD  FLUoxetine (PROZAC) 40 MG capsule Take 80 mg by mouth daily.   Yes Historical Provider, MD  lisdexamfetamine (VYVANSE) 60 MG capsule Take 60 mg by mouth daily after breakfast.   Yes Historical Provider, MD  OxyCODONE (OXYCONTIN) 80 mg T12A 12 hr tablet Take 80 mg by mouth 3 (three) times daily.   Yes Historical Provider, MD  oxymorphone (OPANA) 10 MG tablet Take  10 mg by mouth every 8 (eight) hours as needed (for break through pain).   Yes Historical Provider, MD  pantoprazole (PROTONIX) 40 MG tablet Take 40 mg by mouth daily.   Yes Historical Provider, MD  polyethylene glycol (MIRALAX / GLYCOLAX) packet Take 17 g by mouth 2 (two) times daily as needed (constipation).    Yes Historical Provider, MD  clindamycin (CLEOCIN) 150 MG capsule Take 2 capsules (300 mg total) by mouth 3 (three) times daily. May dispense as 150mg  capsules 06/27/13   Antonietta Breach, PA-C   BP 138/60  Pulse 98  Temp(Src) 99.5 F (37.5  C) (Oral)  Resp 16  SpO2 99%  LMP 08/10/2011  Physical Exam  Nursing note and vitals reviewed. Constitutional: She is oriented to person, place, and time. She appears well-developed and well-nourished. No distress.  Nontoxic/nonseptic appearing  HENT:  Head: Normocephalic and atraumatic.  Right Ear: External ear normal. No mastoid tenderness.  Left Ear: External ear normal. No mastoid tenderness.  Nose: Nose normal.  Mouth/Throat: Uvula is midline, oropharynx is clear and moist and mucous membranes are normal. No trismus in the jaw. Abnormal dentition. Dental caries present. No uvula swelling. No oropharyngeal exudate, posterior oropharyngeal erythema or tonsillar abscesses.    Dental caries noted to upper dentition. Dentition cracked in various locations. Mild gingival swelling. No fluctuance. Oropharynx clear. Patient tolerating secretions without difficulty. Uvula midline.  Eyes: Conjunctivae and EOM are normal. Pupils are equal, round, and reactive to light. No scleral icterus.  Neck: Normal range of motion. Neck supple.  Cardiovascular: Regular rhythm and normal heart sounds.   No murmur heard. Mildly tachycardic. No murmurs.  Pulmonary/Chest: Effort normal. No stridor. No respiratory distress. She has no wheezes.  No stridor.  Musculoskeletal: Normal range of motion.  Lymphadenopathy:    She has cervical adenopathy (Right anterior cervical lymphadenopathy; tender to palpation.).  Neurological: She is alert and oriented to person, place, and time.  GCS 15. Patient moves extremities without ataxia.  Skin: Skin is warm and dry. No rash noted. She is not diaphoretic. No erythema. No pallor.  Psychiatric: She has a normal mood and affect. Her behavior is normal.    ED Course  Procedures (including critical care time) Labs Review Labs Reviewed  I-STAT CREATININE, ED    Imaging Review Ct Soft Tissue Neck W Contrast  06/27/2013   CLINICAL DATA:  Dental pain, tightness, right  neck pain.  EXAM: CT NECK WITH CONTRAST  TECHNIQUE: Multidetector CT imaging of the neck was performed using the standard protocol following the bolus administration of intravenous contrast.  CONTRAST:  164mL OMNIPAQUE IOHEXOL 300 MG/ML  SOLN  COMPARISON:  CT of the head May 27, 2013 and cervical spine radiographs Jun 10, 2008  FINDINGS: Aerodigestive tract is unremarkable, preservation parapharyngeal fat tissue planes. No free fluid or fluid collections within the neck.  Normal appearance of major salivary glands. Slightly heterogeneous thyroid gland without dominant nodule. Cervical vessels are unremarkable. No lymphadenopathy by CT size criteria.  Fever hardening artifact from dental hardware and tongue piercing. Multiple dental caries and absent teeth, periapical abscesses. Lobulated right maxillary mucosal thickening, minimal on the left without paranasal sinus air-fluid levels within the included view. Visualized mastoid air cells are well aerated.  Straightened cervical lordosis without destructive bony lesions.  IMPRESSION: No acute process within the neck or specific findings to explain right neck pain.  Poor dentition.  Mild paranasal sinusitis partially imaged.   Electronically Signed   By: Elon Alas  On: 06/27/2013 02:31     EKG Interpretation None      MDM   Final diagnoses:  Sore throat  Dentalgia    Patient with toothache associated with sore throat, mild facial swelling, and subjective fever. No gross abscess noted on CT scan. Exam unconcerning for Ludwig's angina or spread of infection. Will treat with penicillin. Urged patient to follow-up with dentist. Referral and resource guide provided.  Patient requesting additional narcotics for pain control. Patient has been treated with 1 mg IV Dilaudid in ED. Patient states that this "did not touch it". Patient also noted to have brought her own pill box to the ED. She states that the only medicine she took from this was some  Benadryl. When asked why patient took the Benadryl, she cannot verbalize a reason to me. I have an underlying concern for drug-seeking behavior. Patient is noted to be taking 80 mg OxyContin and 10 mg Opana daily for pain control. I do not believe patient's symptoms warranted any additional pain management. Patient will not be discharged with any narcotics. Have recommended ibuprofen. Return precautions provided. Patient discharged in good condition.   Filed Vitals:   06/26/13 2321 06/27/13 0322  BP: 135/86 138/60  Pulse: 101 98  Temp: 99.5 F (37.5 C)   TempSrc: Oral   Resp: 20 16  SpO2: 99% 99%       Antonietta Breach, PA-C 06/27/13 [phone]

## 2013-06-27 NOTE — Discharge Instructions (Signed)
Dental Pain °A tooth ache may be caused by cavities (tooth decay). Cavities expose the nerve of the tooth to air and hot or cold temperatures. It may come from an infection or abscess (also called a boil or furuncle) around your tooth. It is also often caused by dental caries (tooth decay). This causes the pain you are having. °DIAGNOSIS  °Your caregiver can diagnose this problem by exam. °TREATMENT  °· If caused by an infection, it may be treated with medications which kill germs (antibiotics) and pain medications as prescribed by your caregiver. Take medications as directed. °· Only take over-the-counter or prescription medicines for pain, discomfort, or fever as directed by your caregiver. °· Whether the tooth ache today is caused by infection or dental disease, you should see your dentist as soon as possible for further care. °SEEK MEDICAL CARE IF: °The exam and treatment you received today has been provided on an emergency basis only. This is not a substitute for complete medical or dental care. If your problem worsens or new problems (symptoms) appear, and you are unable to meet with your dentist, call or return to this location. °SEEK IMMEDIATE MEDICAL CARE IF:  °· You have a fever. °· You develop redness and swelling of your face, jaw, or neck. °· You are unable to open your mouth. °· You have severe pain uncontrolled by pain medicine. °MAKE SURE YOU:  °· Understand these instructions. °· Will watch your condition. °· Will get help right away if you are not doing well or get worse. °Document Released: 01/06/2005 Document Revised: 03/31/2011 Document Reviewed: 08/25/2007 °ExitCare® Patient Information ©2014 [employer]. °  Emergency Department Resource Guide °1) Find a Doctor and Pay Out of Pocket °Although you won't have to find out who is covered by your insurance plan, it is a good idea to ask around and get recommendations. You will then need to call the office and see if the doctor you have chosen will  accept you as a new patient and what types of options they offer for patients who are self-pay. Some doctors offer discounts or will set up payment plans for their patients who do not have insurance, but you will need to ask so you aren't surprised when you get to your appointment. ° °2) Contact Your Local Health Department °Not all health departments have doctors that can see patients for sick visits, but many do, so it is worth a call to see if yours does. If you don't know where your local health department is, you can check in your phone book. The CDC also has a tool to help you locate your state's health department, and many state websites also have listings of all of their local health departments. ° °3) Find a Walk-in Clinic °If your illness is not likely to be very severe or complicated, you may want to try a walk in clinic. These are popping up all over the country in pharmacies, drugstores, and shopping centers. They're usually staffed by nurse practitioners or physician assistants that have been trained to treat common illnesses and complaints. They're usually fairly quick and inexpensive. However, if you have serious medical issues or chronic medical problems, these are probably not your best option. ° °No Primary Care Doctor: °- Call Health Connect at  832-8000 - they can help you locate a primary care doctor that  accepts your insurance, provides certain services, etc. °- Physician Referral Service- 1-800-533-3463 ° °Chronic Pain Problems: °Organization           Address  Phone   Notes  [facility]  [phone] Patients need to be referred by their primary care doctor.   Medication Assistance: Organization         Address  Phone   Notes  [facility] [address] [place], [place] [address] [phone] --Must be a resident of [facility] -- Must have NO insurance coverage whatsoever (no Medicaid/ Medicare, etc.) -- The pt.  MUST have a primary care doctor that directs their care regularly and follows them in the community   [facility]  [phone]   [employer]  [phone]    Agencies that provide inexpensive medical care: Organization         Address  Phone   Notes  [place]  [phone]   [facility]    [phone]   [facility] [place], [place] [address] [phone]   [place] [address] [place] [phone]   Planned Parenthood    [phone]   [facility]    [phone]   [name] [address], [place] Phone:  [phone], Fax:  [phone] Hours of Operation:  9 am - 6 pm, M-F.  Also accepts Medicaid/Medicare and self-pay.  [facility] for [place] [address], [address], [place] Phone: [phone], Fax: [phone]. Hours of Operation:  8:30 am - 5:30 pm, M-F.  Also accepts Medicaid and self-pay.  [facility] [place] [address] [place] Phone: [phone]   [place], [place], [place] [phone], Ext. [phone] Mondays & Thursdays: 7-9 AM.  First 15 patients are seen on a first come, first serve basis.    [place] Providers:  Organization         Address  Phone   Notes  [facility] [address], [place] [phone] Also accepts self-pay patients.  [facility] [address], [place]  [phone]   [place], [address], [place] [phone]   [facility] [address], [place] [phone]   Lucianne Lei [address], [place]   [phone] Only accepts [place] patients after they have their name applied to their card.   Self-Pay (no insurance) in  [facility] [place]:  Organization         Address  Phone   Notes  Sickle Cell Patients, [facility] [place] [phone]   [facility] [place] [phone]   [facility] [place]  [place], [place], [name] [phone]   [facility]/Dr. Osei-Bonsu  [address] [place] or [address], [place] [phone] Phone number for both [place] and [place] locations is the same.  [facility] [address] [place] [phone]   [facility] [address] [place] [address], [place] or [address] [phone]   [facility] [address], [place] [phone], phone; [phone], fax Sees patients 1st and 3rd Saturday of every month.  Must not qualify  for public or private insurance (i.e. Medicaid, Medicare, [place] Health Choice, Veterans' Benefits) • Household income should be no more than 200% of the poverty level •The clinic cannot treat you if you are pregnant or think you are pregnant • Sexually transmitted diseases are not treated at the clinic.  ° °Dental Care: °Organization         Address  Phone  Notes  °[facility] [address], [place] [phone] Accepts children up to age 21 who are enrolled in Medicaid or [place] Health Choice; pregnant women with a Medicaid card; and children who have applied for Medicaid or [place] Health Choice, but were declined, whose parents can pay a reduced fee at time of service.  °[facility] [place]  [address], [place] [phone]) [phone] Accepts children up to age 21 who are enrolled in Medicaid or [place] Health Choice; pregnant women with a Medicaid card; and children who have applied for Medicaid or [place] Health Choice, but were declined, whose parents  can pay a reduced fee at time of service.  °[facility] ° [address], [place] ([phone]) [phone] Patients are seen by appointment only. Walk-ins are not accepted. [facility] will see patients 18 years of age and older. °Monday - Tuesday (8am-5pm) °Most Wednesdays (8:30-5pm) °$30 per visit, cash only  °[facility] ° [address], [place] ([phone]) [phone] Patients are seen by appointment only. Walk-ins are not accepted. [facility] will see patients 18 years of age and older. °One Wednesday Evening (Monthly: Volunteer Based).  $30 per visit, cash only  °[facility]  ([phone] for adults; Children under age 4, call Graduate Pediatric Dentistry at [phone]. Children aged 4-14, please call [phone] to request a pediatric application. ° Dental services are provided in all areas of dental care including fillings, crowns and bridges, complete and partial dentures, implants, gum treatment, root canals, and extractions. Preventive care is also provided. Treatment is provided to both adults and children. °Patients are selected via a lottery and there is often a waiting list. °  °[facility] [address], ° ° (336) 763-8833 [email or web address] °  °[facility] [address], [place], [place] (336)723-1848, Ext. [phone] Second and Fourth Thursday of each month, opens at 6:30 AM; Clinic ends at 9 AM.  Patients are seen on a first-come first-served basis, and a limited number are seen during each clinic.  ° °[facility] ° [address], [place], [place] ([phone]) [phone]   Eligibility Requirements °You must have lived in [place], [place], or [place] counties for at least the last three months. °  You cannot be eligible for state or federal sponsored healthcare insurance, including Veterans Administration, Medicaid, or Medicare. °  You generally cannot be eligible for healthcare  insurance through your employer.  °  How to apply: °Eligibility screenings are held every Tuesday and Wednesday afternoon from 1:00 pm until 4:00 pm. You do not need an appointment for the interview!  °[facility] [address], [place], [place] [phone]   °[facility]  336-342-8273   °[facility]  336-703-3100   °[facility]  336-570-6415   ° °Behavioral Health Resources in the Community: °Intensive Outpatient Programs °Organization         Address  Phone    Notes  °[facility] [address], [place], [place] [phone]   °[facility] [address], [place], [place] 336-832-9800   °[facility] [address], [place], [place] ° 336-882-2125   °[facility] [address],  °[place], [place] 1-800-853-5163 or 336-641-4981   °Substance Abuse Resources °Organization         Address  Phone  Notes  °[facility]  336-882-2125   °[facility]  336-784-9470   °[facility]  336-285-9073   °[facility]  336-845-3988   °Residential & Outpatient Substance Abuse Program  1-800-659-3381   °Psychological Services °Organization         Address  Phone  Notes  °[facility]  336- 832-9600   °[facility]  336- 378-7881   °[facility] [address], [place] 1-800-853-5163 or 336-641-4981   ° °Mobile Crisis Teams °Organization         Address  Phone  Notes  °[facility], Mobile Crisis Care Unit  1-877-626-1772   °Assertive °[facility] ° [address] [place], [place] 336-834-9664   °Sharon DeEsch [address] °[place] 336-554-5454   ° °Self-Help/Support Groups °Organization         Address  Phone             Notes  °[facility] [place] - variety of support groups  336- 373-1402 Call for more information  °Narcotics Anonymous (NA),  [facility] [address], °[place]  2 meetings at this location  ° °Residential Treatment Programs °Organization         Address  Phone  Notes  °[facility] [address],    °[place]  1-866-801-8205   °[facility] ° [address], [place], [place] [phone]   °[facility] [address], [place] 336-845-3988 Admissions: 8am-3pm M-F  °[facility] [address]    °[place], [place] 336-841-1104   °[facility] [address], [place], [place] 336-379-7146   °[facility] [address]  °[place], [place] 336-285-9073   °[facility] - Intensive Outpatient [address], [place], [place] 336-852-3033   °[facility] [address]  °[place], [place] 1-877-615-2722 or 336-784-9470   °[facility] ([facility] [address] [place], [place] 336-227-7417 Accepts Medicaid  °[facility] [address]  °[place] 1-800-659-3381 Substance Abuse/Addiction Treatment  ° °[place] [facility] Resources °Organization         Address  Phone  Notes  °[facility]  (888) 581-9988   °Julie Brannon, PhD [address] [place], [place]   (336) 349-5553 or (336) 951-0000   °[facility]   [address] °[place], [place] (336) 349-4454   °[facility] [address], [place], [place] (336) 342-8316 Insurance/Medicaid/sponsorship through [facility]  °[facility] [address]                                    [place], [place] (336) 342-8316 Therapy/tele-psych/case  °[facility] [address]  ° [place], [place] (336) 349-2233    °Dr. Arfeen  (336) 349-4544   °[facility] [place]  [employer] [place]   [facility] 1) [address],  °2) [address], [place] °3)  [address], [place] (336) 349-3220 °(336) 342-7768 ° °(336) 342-8140     °[place] [facility] Abuse [facility] (336) 342-1394 or (336) 342-3537 (After Hours)    ° °   °

## 2013-07-03 NOTE — ED Provider Notes (Signed)
Medical screening examination/treatment/procedure(s) were performed by non-physician practitioner and as supervising physician I was immediately available for consultation/collaboration.   EKG Interpretation None        Julie Manly, MD 07/03/13 2312 

## 2013-07-06 ENCOUNTER — Encounter (HOSPITAL_COMMUNITY): Payer: Self-pay | Admitting: Emergency Medicine

## 2013-07-06 ENCOUNTER — Emergency Department (HOSPITAL_COMMUNITY): Payer: Medicaid Other

## 2013-07-06 ENCOUNTER — Emergency Department (HOSPITAL_COMMUNITY)
Admission: EM | Admit: 2013-07-06 | Discharge: 2013-07-07 | Disposition: A | Discharge status: Home / Self Care | Payer: Medicaid Other | Attending: Emergency Medicine | Admitting: Emergency Medicine

## 2013-07-06 DIAGNOSIS — R112 Nausea with vomiting, unspecified: Secondary | ICD-10-CM

## 2013-07-06 DIAGNOSIS — F1410 Cocaine abuse, uncomplicated: Secondary | ICD-10-CM

## 2013-07-06 DIAGNOSIS — Z8742 Personal history of other diseases of the female genital tract: Secondary | ICD-10-CM | POA: Insufficient documentation

## 2013-07-06 DIAGNOSIS — Z79899 Other long term (current) drug therapy: Secondary | ICD-10-CM | POA: Insufficient documentation

## 2013-07-06 DIAGNOSIS — F1910 Other psychoactive substance abuse, uncomplicated: Secondary | ICD-10-CM

## 2013-07-06 DIAGNOSIS — Z880 Allergy status to penicillin: Secondary | ICD-10-CM | POA: Insufficient documentation

## 2013-07-06 DIAGNOSIS — F1110 Opioid abuse, uncomplicated: Secondary | ICD-10-CM | POA: Insufficient documentation

## 2013-07-06 DIAGNOSIS — D72829 Elevated white blood cell count, unspecified: Secondary | ICD-10-CM

## 2013-07-06 DIAGNOSIS — M545 Low back pain, unspecified: Secondary | ICD-10-CM | POA: Insufficient documentation

## 2013-07-06 DIAGNOSIS — F329 Major depressive disorder, single episode, unspecified: Secondary | ICD-10-CM

## 2013-07-06 DIAGNOSIS — G8929 Other chronic pain: Secondary | ICD-10-CM | POA: Insufficient documentation

## 2013-07-06 DIAGNOSIS — F1020 Alcohol dependence, uncomplicated: Secondary | ICD-10-CM

## 2013-07-06 DIAGNOSIS — M546 Pain in thoracic spine: Secondary | ICD-10-CM | POA: Insufficient documentation

## 2013-07-06 DIAGNOSIS — G894 Chronic pain syndrome: Secondary | ICD-10-CM

## 2013-07-06 DIAGNOSIS — R1110 Vomiting, unspecified: Secondary | ICD-10-CM

## 2013-07-06 DIAGNOSIS — F17200 Nicotine dependence, unspecified, uncomplicated: Secondary | ICD-10-CM | POA: Insufficient documentation

## 2013-07-06 DIAGNOSIS — F1124 Opioid dependence with opioid-induced mood disorder: Secondary | ICD-10-CM

## 2013-07-06 DIAGNOSIS — K219 Gastro-esophageal reflux disease without esophagitis: Secondary | ICD-10-CM | POA: Insufficient documentation

## 2013-07-06 DIAGNOSIS — M549 Dorsalgia, unspecified: Secondary | ICD-10-CM

## 2013-07-06 DIAGNOSIS — G43909 Migraine, unspecified, not intractable, without status migrainosus: Secondary | ICD-10-CM | POA: Insufficient documentation

## 2013-07-06 DIAGNOSIS — R45851 Suicidal ideations: Secondary | ICD-10-CM

## 2013-07-06 DIAGNOSIS — R1084 Generalized abdominal pain: Secondary | ICD-10-CM | POA: Insufficient documentation

## 2013-07-06 DIAGNOSIS — F411 Generalized anxiety disorder: Secondary | ICD-10-CM | POA: Insufficient documentation

## 2013-07-06 HISTORY — DX: Cocaine abuse, uncomplicated: F14.10

## 2013-07-06 LAB — COMPREHENSIVE METABOLIC PANEL
ALBUMIN: 3.7 g/dL (ref 3.5–5.2)
ALT: 14 U/L (ref 0–35)
AST: 13 U/L (ref 0–37)
Alkaline Phosphatase: 97 U/L (ref 39–117)
BILIRUBIN TOTAL: 0.5 mg/dL (ref 0.3–1.2)
BUN: 15 mg/dL (ref 6–23)
CALCIUM: 9.6 mg/dL (ref 8.4–10.5)
CHLORIDE: 96 meq/L (ref 96–112)
CO2: 22 mEq/L (ref 19–32)
CREATININE: 0.61 mg/dL (ref 0.50–1.10)
GFR calc Af Amer: >90 mL/min (ref >90)
GFR calc non Af Amer: >90 mL/min (ref >90)
Glucose, Bld: 115 mg/dL — ABNORMAL HIGH (ref 70–99)
Potassium: 3.6 mEq/L — ABNORMAL LOW (ref 3.7–5.3)
SODIUM: 134 meq/L — AB (ref 137–147)
Total Protein: 7.9 g/dL (ref 6.0–8.3)

## 2013-07-06 LAB — ACETAMINOPHEN LEVEL

## 2013-07-06 LAB — RAPID URINE DRUG SCREEN, HOSP PERFORMED
Amphetamines: NOT DETECTED
BARBITURATES: NOT DETECTED
BENZODIAZEPINES: NOT DETECTED
Cocaine: POSITIVE — AB
Opiates: POSITIVE — AB
Tetrahydrocannabinol: NOT DETECTED

## 2013-07-06 LAB — CBC
HEMATOCRIT: 38.6 % (ref 36.0–46.0)
Hemoglobin: 13.1 g/dL (ref 12.0–15.0)
MCH: 29.7 pg (ref 26.0–34.0)
MCHC: 33.9 g/dL (ref 30.0–36.0)
MCV: 87.5 fL (ref 78.0–100.0)
Platelets: 402 10*3/uL — ABNORMAL HIGH (ref 150–400)
RBC: 4.41 MIL/uL (ref 3.87–5.11)
RDW: 14.4 % (ref 11.5–15.5)
WBC: 12.3 10*3/uL — AB (ref 4.0–10.5)

## 2013-07-06 LAB — SALICYLATE LEVEL

## 2013-07-06 LAB — ETHANOL: Alcohol, Ethyl (B): <11 mg/dL (ref 0–11)

## 2013-07-06 LAB — I-STAT CG4 LACTIC ACID, ED: LACTIC ACID, VENOUS: 1.67 mmol/L (ref 0.5–2.2)

## 2013-07-06 MED ORDER — HYDROMORPHONE HCL PF 1 MG/ML IJ SOLN: 1.0000 mg | Freq: Once | INTRAMUSCULAR | Status: DC

## 2013-07-06 MED ORDER — LOPERAMIDE HCL 2 MG PO CAPS
2.0000 mg | ORAL_CAPSULE | ORAL | Status: DC | PRN
  Administered 2013-07-07: 4 mg via ORAL
  Filled 2013-07-06: qty 2

## 2013-07-06 MED ORDER — ACETAMINOPHEN 500 MG PO TABS
1000.0000 mg | ORAL_TABLET | Freq: Four times a day (QID) | ORAL | Status: DC | PRN
  Administered 2013-07-06: 1000 mg via ORAL
  Filled 2013-07-06: qty 2

## 2013-07-06 MED ORDER — PANTOPRAZOLE SODIUM 40 MG PO TBEC
40.0000 mg | DELAYED_RELEASE_TABLET | Freq: Every day | ORAL | Status: DC
  Administered 2013-07-06 – 2013-07-07 (×2): 40 mg via ORAL
  Filled 2013-07-06 (×2): qty 1

## 2013-07-06 MED ORDER — IOHEXOL 300 MG/ML  SOLN
50.0000 mL | Freq: Once | INTRAMUSCULAR | Status: AC | PRN
  Administered 2013-07-06: 50 mL via ORAL

## 2013-07-06 MED ORDER — CLONAZEPAM 0.5 MG PO TABS
0.5000 mg | ORAL_TABLET | Freq: Three times a day (TID) | ORAL | Status: DC
  Administered 2013-07-06: 1 mg via ORAL
  Filled 2013-07-06 (×2): qty 1

## 2013-07-06 MED ORDER — HYDROMORPHONE HCL PF 1 MG/ML IJ SOLN
1.0000 mg | Freq: Once | INTRAMUSCULAR | Status: AC
  Administered 2013-07-06: 1 mg via INTRAVENOUS
  Filled 2013-07-06: qty 1

## 2013-07-06 MED ORDER — SODIUM CHLORIDE 0.9 % IV BOLUS (SEPSIS)
1000.0000 mL | Freq: Once | INTRAVENOUS | Status: AC
  Administered 2013-07-06: 1000 mL via INTRAVENOUS

## 2013-07-06 MED ORDER — LISDEXAMFETAMINE DIMESYLATE 30 MG PO CAPS
60.0000 mg | ORAL_CAPSULE | Freq: Every day | ORAL | Status: DC
  Administered 2013-07-07: 60 mg via ORAL
  Filled 2013-07-06: qty 2

## 2013-07-06 MED ORDER — NICOTINE 21 MG/24HR TD PT24
21.0000 mg | MEDICATED_PATCH | Freq: Every day | TRANSDERMAL | Status: DC
  Administered 2013-07-06 – 2013-07-07 (×2): 21 mg via TRANSDERMAL
  Filled 2013-07-06: qty 1

## 2013-07-06 MED ORDER — PROMETHAZINE HCL 25 MG RE SUPP
25.0000 mg | Freq: Once | RECTAL | Status: AC
  Administered 2013-07-06: 25 mg via RECTAL
  Filled 2013-07-06: qty 1

## 2013-07-06 MED ORDER — CLONIDINE HCL 0.1 MG PO TABS: 0.1000 mg | ORAL_TABLET | Freq: Every day | ORAL | Status: DC

## 2013-07-06 MED ORDER — FLUOXETINE HCL 40 MG PO CAPS
80.0000 mg | ORAL_CAPSULE | Freq: Every day | ORAL | Status: DC
  Administered 2013-07-06 – 2013-07-07 (×2): 80 mg via ORAL
  Filled 2013-07-06 (×2): qty 2

## 2013-07-06 MED ORDER — OXYCODONE HCL ER 15 MG PO T12A
60.0000 mg | EXTENDED_RELEASE_TABLET | Freq: Two times a day (BID) | ORAL | Status: AC
  Administered 2013-07-06 – 2013-07-07 (×2): 60 mg via ORAL
  Filled 2013-07-06 (×2): qty 4

## 2013-07-06 MED ORDER — POLYETHYLENE GLYCOL 3350 17 G PO PACK
17.0000 g | PACK | Freq: Two times a day (BID) | ORAL | Status: DC | PRN
  Filled 2013-07-06: qty 1

## 2013-07-06 MED ORDER — HYDROXYZINE HCL 25 MG PO TABS: 25.0000 mg | ORAL_TABLET | Freq: Four times a day (QID) | ORAL | Status: DC | PRN

## 2013-07-06 MED ORDER — METHOCARBAMOL 500 MG PO TABS: 500.0000 mg | ORAL_TABLET | Freq: Three times a day (TID) | ORAL | Status: DC | PRN

## 2013-07-06 MED ORDER — DICYCLOMINE HCL 20 MG PO TABS: 20.0000 mg | ORAL_TABLET | Freq: Four times a day (QID) | ORAL | Status: DC | PRN

## 2013-07-06 MED ORDER — ZOLPIDEM TARTRATE 5 MG PO TABS: 5.0000 mg | ORAL_TABLET | Freq: Every evening | ORAL | Status: DC | PRN

## 2013-07-06 MED ORDER — CLONIDINE HCL 0.1 MG PO TABS: 0.3000 mg | ORAL_TABLET | Freq: Three times a day (TID) | ORAL | Status: DC

## 2013-07-06 MED ORDER — ACETAMINOPHEN 325 MG PO TABS: 650.0000 mg | ORAL_TABLET | ORAL | Status: DC | PRN

## 2013-07-06 MED ORDER — METOCLOPRAMIDE HCL 5 MG/ML IJ SOLN
10.0000 mg | Freq: Once | INTRAMUSCULAR | Status: AC
  Administered 2013-07-06: 10 mg via INTRAVENOUS
  Filled 2013-07-06: qty 2

## 2013-07-06 MED ORDER — OXYCODONE-ACETAMINOPHEN 5-325 MG PO TABS
2.0000 | ORAL_TABLET | Freq: Once | ORAL | Status: AC
  Administered 2013-07-06: 2 via ORAL
  Filled 2013-07-06: qty 2

## 2013-07-06 MED ORDER — CLONIDINE HCL 0.1 MG PO TABS
0.1000 mg | ORAL_TABLET | ORAL | Status: DC
Start: 2013-07-09 — End: 2013-07-07

## 2013-07-06 MED ORDER — LORAZEPAM 1 MG PO TABS: 1.0000 mg | ORAL_TABLET | Freq: Three times a day (TID) | ORAL | Status: DC | PRN

## 2013-07-06 MED ORDER — CLONAZEPAM 0.5 MG PO TABS
0.5000 mg | ORAL_TABLET | Freq: Three times a day (TID) | ORAL | Status: DC | PRN
  Administered 2013-07-06 – 2013-07-07 (×2): 0.5 mg via ORAL
  Filled 2013-07-06 (×2): qty 1

## 2013-07-06 MED ORDER — CLONIDINE HCL 0.1 MG PO TABS
0.1000 mg | ORAL_TABLET | Freq: Four times a day (QID) | ORAL | Status: DC
  Administered 2013-07-06 – 2013-07-07 (×2): 0.1 mg via ORAL
  Filled 2013-07-06 (×3): qty 1

## 2013-07-06 MED ORDER — PROMETHAZINE HCL 25 MG RE SUPP: 25.0000 mg | Freq: Four times a day (QID) | RECTAL | Status: DC | PRN

## 2013-07-06 NOTE — ED Notes (Signed)
Belongings 1 bag in locker 33

## 2013-07-06 NOTE — ED Notes (Signed)
Patient can not urinate, will wait for fluids to run.

## 2013-07-06 NOTE — ED Notes (Signed)
Presents with complaint of SI, plan to take sleeping pills or walk into traffic.  Pt reports she also attempted same 2 years ago.  Feeling hopeless.  Pt reports she takes pain pills for chronic back pain.  Diagnosed with Bipolar, Anxiety and Depression in the past.  Denies HI or AV hallucinations.  Pt calm & cooperative at present.

## 2013-07-06 NOTE — ED Notes (Signed)
Pt refuses to let writer draw blood, pt sts she has been throwing up and wants an IV due to pt has been vomiting all day.  RN notified

## 2013-07-06 NOTE — Consult Note (Signed)
[facility] Consult   Reason for Consult:  Suicidal ideation Referring Physician:  EDP  [name] [name] is an 33 y.o. female. Total Time spent with patient: 45 minutes  Assessment: AXIS I:  Anxiety Disorder NOS, Depressive Disorder NOS and Substance Abuse AXIS II:  Deferred AXIS III:   Past Medical History  Diagnosis Date  . Migraine   . GERD (gastroesophageal reflux disease)     protonix evenings  . Endometriosis     chronic pain  . Anxiety     klonipin for stress disorder  . Degenerative disc disease    AXIS IV:  other psychosocial or environmental problems AXIS V:  11-20 some danger of hurting self or others possible OR occasionally fails to maintain minimal personal hygiene OR gross impairment in communication  Plan:  Recommend psychiatric Inpatient admission when medically cleared.  Subjective:   [name] [name] is a 33 y.o. female patient admitted with Major Depressive Disorder.  HPI:  Patient states "I am suicidal. I have been in so much pain but I haven't taken my medicine cause I didn't while I was throwing up.  It has been 2 days.  I am shaking really bad.  I am so frustrated with my husband and my kids.  I just want to kill myself.  My plan was to take an overdose."  Patient states that she has past history of suicide attempt via overdose and has been hospitalized.  Patient states that she gets he medication from her primary doctor  Spoke with primary doctor to verify patient pain (OxyContin and Opana) anxiety (Klonopin) and Vyvance medications.  Patient is being treated by Dr. Welton Flakes at [facility].  Patient continues to treat patient but patient has missed last 2 appointments but medication was last filled in May and should still have medication at home.  HPI Elements:   Location:  suicidal ideation. Quality:  plan to overdose. Severity:  plan to overdose. Timing:  2 days. Review of Systems  Constitutional: Positive for diaphoresis.   Gastrointestinal: Positive for nausea and abdominal pain.  Musculoskeletal: Positive for back pain.  Neurological: Positive for tremors. Negative for headaches.  Psychiatric/Behavioral: Positive for depression, suicidal ideas and substance abuse. Negative for hallucinations. The patient is nervous/anxious.     Family History  Problem Relation Age of Onset  . Coronary artery disease Father   . Coronary artery disease Maternal Grandmother   . Breast cancer Mother   . Cancer Mother     Past Psychiatric History: Past Medical History  Diagnosis Date  . Migraine   . GERD (gastroesophageal reflux disease)     protonix evenings  . Endometriosis     chronic pain  . Anxiety     klonipin for stress disorder  . Degenerative disc disease     reports that she has been smoking Cigarettes.  She has a 14 pack-year smoking history. She does not have any smokeless tobacco history on file. She reports that she does not drink alcohol or use illicit drugs. Family History  Problem Relation Age of Onset  . Coronary artery disease Father   . Coronary artery disease Maternal Grandmother   . Breast cancer Mother   . Cancer Mother            Allergies:   Allergies  Allergen Reactions  . Epidural Tray 17gx3-1-2" [Nerve Block Tray] Other (See Comments)    Paralysis and severe pain in head/neck/shoulders.  No anaphylaxis.  . Ibuprofen Hives  . Penicillins Hives  .  Zofran [Ondansetron Hcl] Nausea And Vomiting    ACT Assessment Complete:  Yes:    Educational Status    Risk to Self: Risk to self Is patient at risk for suicide?: Yes Substance abuse history and/or treatment for substance abuse?: No  Risk to Others:    Abuse:    Prior Inpatient Therapy:    Prior Outpatient Therapy:    Additional Information:                    Objective: Blood pressure 100/66, pulse 80, temperature 98.7 F (37.1 C), temperature source Oral, resp. rate 18, height $RemoveBe'5\' 2"'[id number]$  (1.575 m), weight 61.236  kg (135 lb), last menstrual period 08/10/2011, SpO2 99.00%.Body mass index is 24.69 kg/(m^2). Results for orders placed during the hospital encounter of 07/06/13 (from the past 72 hour(s))  CBC     Status: Abnormal   Collection Time    07/06/13  8:47 AM      Result Value Ref Range   WBC 12.3 (*) 4.0 - 10.5 K/uL   RBC 4.41  3.87 - 5.11 MIL/uL   Hemoglobin 13.1  12.0 - 15.0 g/dL   HCT 38.6  36.0 - 46.0 %   MCV 87.5  78.0 - 100.0 fL   MCH 29.7  26.0 - 34.0 pg   MCHC 33.9  30.0 - 36.0 g/dL   RDW 14.4  11.5 - 15.5 %   Platelets 402 (*) 150 - 400 K/uL  ACETAMINOPHEN LEVEL     Status: None   Collection Time    07/06/13  8:48 AM      Result Value Ref Range   Acetaminophen (Tylenol), Serum <15.0  10 - 30 ug/mL   Comment:            THERAPEUTIC CONCENTRATIONS VARY     SIGNIFICANTLY. A RANGE OF 10-30     ug/mL MAY BE AN EFFECTIVE     CONCENTRATION FOR MANY PATIENTS.     HOWEVER, SOME ARE BEST TREATED     AT CONCENTRATIONS OUTSIDE THIS     RANGE.     ACETAMINOPHEN CONCENTRATIONS     >150 ug/mL AT 4 HOURS AFTER     INGESTION AND >50 ug/mL AT 12     HOURS AFTER INGESTION ARE     OFTEN ASSOCIATED WITH TOXIC     REACTIONS.  COMPREHENSIVE METABOLIC PANEL     Status: Abnormal   Collection Time    07/06/13  8:48 AM      Result Value Ref Range   Sodium 134 (*) 137 - 147 mEq/L   Potassium 3.6 (*) 3.7 - 5.3 mEq/L   Chloride 96  96 - 112 mEq/L   CO2 22  19 - 32 mEq/L   Glucose, Bld 115 (*) 70 - 99 mg/dL   BUN 15  6 - 23 mg/dL   Creatinine, Ser 0.61  0.50 - 1.10 mg/dL   Calcium 9.6  8.4 - 10.5 mg/dL   Total Protein 7.9  6.0 - 8.3 g/dL   Albumin 3.7  3.5 - 5.2 g/dL   AST 13  0 - 37 U/L   ALT 14  0 - 35 U/L   Alkaline Phosphatase 97  39 - 117 U/L   Total Bilirubin 0.5  0.3 - 1.2 mg/dL   GFR calc non Af Amer >90  >90 mL/min   GFR calc Af Amer >90  >90 mL/min   Comment: (NOTE)     The eGFR has been calculated using the  CKD EPI equation.     This calculation has not been validated in all  clinical situations.     eGFR's persistently <90 mL/min signify possible Chronic Kidney     Disease.  ETHANOL     Status: None   Collection Time    07/06/13  8:48 AM      Result Value Ref Range   Alcohol, Ethyl (B) <11  0 - 11 mg/dL   Comment:            LOWEST DETECTABLE LIMIT FOR     SERUM ALCOHOL IS 11 mg/dL     FOR MEDICAL PURPOSES ONLY  SALICYLATE LEVEL     Status: Abnormal   Collection Time    07/06/13  8:48 AM      Result Value Ref Range   Salicylate Lvl <2.0 (*) 2.8 - 20.0 mg/dL  URINE RAPID DRUG SCREEN (HOSP PERFORMED)     Status: Abnormal   Collection Time    07/06/13  9:10 AM      Result Value Ref Range   Opiates POSITIVE (*) NONE DETECTED   Cocaine POSITIVE (*) NONE DETECTED   Benzodiazepines NONE DETECTED  NONE DETECTED   Amphetamines NONE DETECTED  NONE DETECTED   Tetrahydrocannabinol NONE DETECTED  NONE DETECTED   Barbiturates NONE DETECTED  NONE DETECTED   Comment:            DRUG SCREEN FOR MEDICAL PURPOSES     ONLY.  IF CONFIRMATION IS NEEDED     FOR ANY PURPOSE, NOTIFY LAB     WITHIN 5 DAYS.                LOWEST DETECTABLE LIMITS     FOR URINE DRUG SCREEN     Drug Class       Cutoff (ng/mL)     Amphetamine      1000     Barbiturate      200     Benzodiazepine   200     Tricyclics       300     Opiates          300     Cocaine          300     THC              50  I-STAT CG4 LACTIC ACID, ED     Status: None   Collection Time    07/06/13  9:26 AM      Result Value Ref Range   Lactic Acid, Venous 1.67  0.5 - 2.2 mmol/L   Labs are reviewed see above.  Medications reviewed and verified by primary doctor Dr. [name] No changes made  Current Facility-Administered Medications  Medication Dose Route Frequency Nellene Courtois Last Rate Last Dose  . acetaminophen (TYLENOL) tablet 1,000 mg  1,000 mg Oral Q6H PRN Dagmar Hait, MD   1,000 mg at 07/06/13 1653  . acetaminophen (TYLENOL) tablet 650 mg  650 mg Oral Q4H PRN Dagmar Hait, MD      .  FLUoxetine (PROZAC) capsule 80 mg  80 mg Oral Daily Dagmar Hait, MD      . Melene Muller ON 07/07/2013] lisdexamfetamine (VYVANSE) capsule 60 mg  60 mg Oral QPC breakfast Dagmar Hait, MD      . LORazepam (ATIVAN) tablet 1 mg  1 mg Oral Q8H PRN Dagmar Hait, MD      . nicotine (NICODERM CQ - dosed in mg/24  hours) patch 21 mg  21 mg Transdermal Daily Osvaldo Shipper, MD   21 mg at 07/06/13 1653  . pantoprazole (PROTONIX) EC tablet 40 mg  40 mg Oral Daily Osvaldo Shipper, MD   40 mg at 07/06/13 1700  . polyethylene glycol (MIRALAX / GLYCOLAX) packet 17 g  17 g Oral BID PRN Osvaldo Shipper, MD       Current Outpatient Prescriptions  Medication Sig Dispense Refill  . acetaminophen (TYLENOL) 500 MG tablet Take 2,000 mg by mouth every 6 (six) hours as needed for mild pain or headache.      . clonazePAM (KLONOPIN) 1 MG tablet Take 0.5-1 mg by mouth 3 (three) times daily.       Marland Kitchen FLUoxetine (PROZAC) 40 MG capsule Take 80 mg by mouth daily.      Marland Kitchen lisdexamfetamine (VYVANSE) 60 MG capsule Take 60 mg by mouth daily after breakfast.      . OxyCODONE (OXYCONTIN) 80 mg T12A 12 hr tablet Take 80 mg by mouth 3 (three) times daily.      Marland Kitchen oxymorphone (OPANA) 10 MG tablet Take 10 mg by mouth every 8 (eight) hours as needed (for break through pain).      . pantoprazole (PROTONIX) 40 MG tablet Take 40 mg by mouth daily.      . polyethylene glycol (MIRALAX / GLYCOLAX) packet Take 17 g by mouth 2 (two) times daily as needed (constipation).       . promethazine (PHENERGAN) 25 MG suppository Place 1 suppository (25 mg total) rectally every 6 (six) hours as needed for nausea or vomiting.  12 each  0    Psychiatric Specialty Exam:     Blood pressure 100/66, pulse 80, temperature 98.7 F (37.1 C), temperature source Oral, resp. rate 18, height $RemoveBe'5\' 2"'ggfAJaMYc$  (1.575 m), weight 61.236 kg (135 lb), last menstrual period 08/10/2011, SpO2 99.00%.Body mass index is 24.69 kg/(m^2).  General Appearance:  Casual  Eye Contact::  Good  Speech:  Clear and Coherent  Volume:  Normal  Mood:  Depressed  Affect:  Congruent  Thought Process:  Circumstantial  Orientation:  Full (Time, Place, and Person)  Thought Content:  Rumination  Suicidal Thoughts:  Yes.  with intent/plan  Homicidal Thoughts:  No  Memory:  Immediate;   Good Recent;   Good Remote;   Good  Judgement:  Poor  Insight:  Lacking  Psychomotor Activity:  Tremor  Concentration:  Fair  Recall:  Good  Fund of Knowledge:Good  Language: Good  Akathisia:  No  Handed:  Right  AIMS (if indicated):     Assets:  Communication Skills Desire for Improvement  Sleep:      Musculoskeletal: Strength & Muscle Tone: within normal limits Gait & Station: normal Patient leans: N/A  Treatment Plan Summary: Daily contact with patient to assess and evaluate symptoms and progress in treatment Medication management  Inpatient treatment recommended.  Continue to monitor for safety and stabilization until inpatient is found.   Earleen Newport, FNP-BC 07/06/2013 5:11 PM

## 2013-07-06 NOTE — ED Notes (Signed)
We, with assist from [facility] officer and security had to [place] him back into his room, d/t extreme agitation, where he settled down after a short time.  His skin is normal, warm and dry and he continues to breath normally.

## 2013-07-06 NOTE — ED Notes (Signed)
I have just spoken with her and she tells me "If I go home right now I'll just take a bunch of sleeping pills 'cause I don't want to live like this".  Dr. Mingo Amber notified, who speaks with her imediately, and is speaking with her as I write this.

## 2013-07-06 NOTE — ED Notes (Signed)
Patient reports chronic back pain. 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 Patient reports she is unable to keep her medications down and wants to kill herself. Patient states she took a bunch of sleeping pills the other day but just vomited them back up. Patient initial complaint was for back pain and vomiting, and when she was advised she would have to wait for a room in the back she then endorses SI, states if we place her in the lobby she is going to walk into traffic to kill herself.

## 2013-07-06 NOTE — ED Provider Notes (Signed)
CSN: [id number]     Arrival date & time 07/06/13  7096 History   First MD Initiated Contact with Patient 07/06/13 [phone]     Chief Complaint  Patient presents with  . Back Pain  . Suicidal     (Consider location/radiation/quality/duration/timing/severity/associated sxs/prior Treatment) Patient is a 32 y.o. female presenting with back pain. The history is provided by the patient.  Back Pain Location:  Thoracic spine and lumbar spine Quality:  Aching Radiates to:  L posterior upper leg and R posterior upper leg Pain severity:  Moderate Pain is:  Worse during the day Onset quality:  Sudden Timing:  Constant Progression:  Worsening Chronicity:  Chronic Context: recent illness (began vomiting 2 days ago, unable to keep her pain pills down)   Relieved by:  Nothing Worsened by:  Nothing tried Associated symptoms: abdominal pain   Associated symptoms: no bladder incontinence, no bowel incontinence, no fever, no tingling and no weakness     Past Medical History  Diagnosis Date  . Migraine   . GERD (gastroesophageal reflux disease)     protonix evenings  . Endometriosis     chronic pain  . Anxiety     klonipin for stress disorder  . Degenerative disc disease    Past Surgical History  Procedure Laterality Date  . Appendectomy    . Tonsillectomy    . Tubal ligation    . Laparoscopic assisted vaginal hysterectomy  08/19/2011    Procedure: LAPAROSCOPIC ASSISTED VAGINAL HYSTERECTOMY;  Surgeon: Gus Height, MD;  Location: [facility];  Service: Gynecology;  Laterality: N/A;  . Abdominal hysterectomy     Family History  Problem Relation Age of Onset  . Coronary artery disease Father   . Coronary artery disease Maternal Grandmother   . Breast cancer Mother   . Cancer Mother    History  Substance Use Topics  . Smoking status: Current Every Day Smoker -- 1.00 packs/day for 14 years    Types: Cigarettes  . Smokeless tobacco: Not on file  . Alcohol Use: No   OB History   Grav Para  Term Preterm Abortions TAB SAB Ect Mult Living   6 2             Review of Systems  Constitutional: Negative for fever.  Gastrointestinal: Positive for abdominal pain. Negative for bowel incontinence.  Genitourinary: Negative for bladder incontinence.  Musculoskeletal: Positive for back pain.  Neurological: Negative for tingling and weakness.  All other systems reviewed and are negative.     Allergies  Epidural tray 17gx3-1-2"; Ibuprofen; Penicillins; and Zofran  Home Medications   Prior to Admission medications   Medication Sig Start Date End Date Taking? Authorizing Provider  acetaminophen (TYLENOL) 500 MG tablet Take 2,000 mg by mouth every 6 (six) hours as needed for mild pain or headache.   Yes Historical Provider, MD  clonazePAM (KLONOPIN) 1 MG tablet Take 0.5-1 mg by mouth 3 (three) times daily.    Yes Historical Provider, MD  FLUoxetine (PROZAC) 40 MG capsule Take 80 mg by mouth daily.   Yes Historical Provider, MD  lisdexamfetamine (VYVANSE) 60 MG capsule Take 60 mg by mouth daily after breakfast.   Yes Historical Provider, MD  OxyCODONE (OXYCONTIN) 80 mg T12A 12 hr tablet Take 80 mg by mouth 3 (three) times daily.   Yes Historical Provider, MD  oxymorphone (OPANA) 10 MG tablet Take 10 mg by mouth every 8 (eight) hours as needed (for break through pain).   Yes Historical Provider, MD  pantoprazole (PROTONIX) 40 MG tablet Take 40 mg by mouth daily.   Yes Historical Provider, MD  polyethylene glycol (MIRALAX / GLYCOLAX) packet Take 17 g by mouth 2 (two) times daily as needed (constipation).    Yes Historical Provider, MD   BP 133/73  Pulse 78  Temp(Src) 98.5 F (36.9 C) (Oral)  Resp 20  Ht 5\' 2"  (1.575 m)  Wt 135 lb (61.236 kg)  BMI 24.69 kg/m2  SpO2 100%  LMP 08/10/2011 Physical Exam  Nursing note and vitals reviewed. Constitutional: She is oriented to person, place, and time. She appears well-developed and well-nourished. No distress.  HENT:  Head: Normocephalic  and atraumatic.  Eyes: EOM are normal. Pupils are equal, round, and reactive to light.  Neck: Normal range of motion. Neck supple.  Cardiovascular: Normal rate and regular rhythm.  Exam reveals no friction rub.   No murmur heard. Pulmonary/Chest: Effort normal and breath sounds normal. No respiratory distress. She has no wheezes. She has no rales.  Abdominal: Soft. She exhibits no distension. There is tenderness (diffuse). There is no rebound.  Musculoskeletal: Normal range of motion. She exhibits no edema.  Neurological: She is alert and oriented to person, place, and time.  Skin: She is not diaphoretic.    ED Course  Procedures (including critical care time) Labs Review Labs Reviewed  ACETAMINOPHEN LEVEL  CBC  COMPREHENSIVE METABOLIC PANEL  ETHANOL  SALICYLATE LEVEL  URINE RAPID DRUG SCREEN (HOSP PERFORMED)    Imaging Review Ct Abdomen Pelvis Wo Contrast  07/06/2013   CLINICAL DATA:  Vomiting and abdominal pain  EXAM: CT ABDOMEN AND PELVIS WITHOUT CONTRAST  TECHNIQUE: Multidetector CT imaging of the abdomen and pelvis was performed following the standard protocol without IV contrast.  COMPARISON:  Lumbar spine series of May 27, 2013  FINDINGS: The liver, gallbladder, pancreas, spleen, adrenal glands, and kidneys are normal. The caliber of the abdominal aorta is normal. There is no periaortic nor pericaval lymphadenopathy. The stomach, small bowel, and large bowel exhibit no acute abnormalities. The appendix is surgically absent. The urinary bladder is normal. The uterus is surgically absent. There are no adnexal masses. There is no free abdominal or pelvic fluid.  The bony pelvis and lumbar spine exhibit no acute abnormalities. There is mild posterior disc bulging at L5-S1. The lung bases are clear.  IMPRESSION: There is no acute intra-abdominal or pelvic abnormality. There is mild posterior disc bulging at L5-S1.   Electronically Signed   By: David  Martinique   On: 07/06/2013 10:39      EKG Interpretation None      MDM   Final diagnoses:  Back pain  Suicidal ideation  Vomiting    33 year old female presents with vomiting. Mild numerous episodes of vomiting for past 2 days. She states she's been unable to keep her pain meds she takes for chronic back pain. She was suicidal with nurses when she wasn't immediately brought back to room. Patient endorsed she was going to walk into traffic to herself. On my exam patient is tearful. She states the vomiting is made her feel so bad that she wants to die. She is in terrible pain due to being unable to take her pain medicines. She does have diffuse abdominal pain. Denies any fevers, diarrhea, dysuria, vaginal bleeding. I believe patient's suicidality is secondary to pain. I do not think she is suicidal. She was situational he suicidal with nurses and it seemed to develop once she was told she wouldn't be immediately bedded. We  will scan her abdomen and check labs, give fluids, pain medicines. We will reevaluate. Labs and scan ok. Persistently stating she is still feeling miserable and if she went home she would take sleeping pills to end this. I believe she is manipulating [place] to get pain meds. I explained to her if she stays to see Psychiatry she will not get pain meds. She understands. i had the hospitalist service evaluate to see if an admission could have with her abdominal pain and vomiting, but they did not feel she needed to be admitted. I agree. I spoke with Dr. Lovena Le of Psych, he said they would be happy to evaluate her. Placed in Psychiatric hold.  Osvaldo Shipper, MD 07/06/13 [phone]

## 2013-07-06 NOTE — Consult Note (Signed)
Requesting physician: Dr. Mingo Amber, [place]  Primary Care Physician: [name], MD  Reason for consultation: Evaluation for potential admission for nausea and vomiting.   History of Present Illness: Patient is a 33 year old woman with past medical history significant for polysubstance abuse including cocaine, chronic pain syndrome related to chronic back pain for which she is on large amounts of chronic pain medication. She presents to the hospital today stating that she has had intractable nausea and vomiting, about 20 episodes per day for the past 2-1/2 days. Interestingly, she has had no episodes of emesis in the emergency department, none that have been witnessed by her RN. She also states that she ran out of her chronic pain medications about 3 months ago and is not due for a refill for another week. When asked if she has vomited in the emergency department she states that she has vomited over 10 times, when I have asked her to see the emesis she states that she threw up in the trash can and this has already been cleaned up by housekeeping. Full set of lab work has been appropriately ordered by emergency department physician and there are no signs of electrolyte disturbance or dehydration which we would expect to see in somebody who has had such degree of profuse vomiting. When confronted with her UDS positive for cocaine she appears surprised and does not know how that could be possible. I have told the patient I believe she can probably discharge home with oral/suppository antiemetics, however she promptly states that if we discharge her home she is going to overdose on sleeping pills because "I am done with living with this pain". This corroborates what she had previously told her nurse and ED physician.  Allergies:   Allergies  Allergen Reactions  . Epidural Tray [id number]" [Nerve Block Tray] Other (See Comments)    Paralysis and severe pain in head/neck/shoulders.  No  anaphylaxis.  . Ibuprofen Hives  . Penicillins Hives  . Zofran [Ondansetron Hcl] Nausea And Vomiting      Past Medical History  Diagnosis Date  . Migraine   . GERD (gastroesophageal reflux disease)     protonix evenings  . Endometriosis     chronic pain  . Anxiety     klonipin for stress disorder  . Degenerative disc disease     Past Surgical History  Procedure Laterality Date  . Appendectomy    . Tonsillectomy    . Tubal ligation    . Laparoscopic assisted vaginal hysterectomy  08/19/2011    Procedure: LAPAROSCOPIC ASSISTED VAGINAL HYSTERECTOMY;  Surgeon: Gus Height, MD;  Location: [facility];  Service: Gynecology;  Laterality: N/A;  . Abdominal hysterectomy      Scheduled Meds: . clonazePAM  0.5-1 mg Oral TID  . FLUoxetine  80 mg Oral Daily  . [START ON 07/07/2013] lisdexamfetamine  60 mg Oral QPC breakfast  . nicotine  21 mg Transdermal Daily  . pantoprazole  40 mg Oral Daily   Continuous Infusions:  PRN Meds:.acetaminophen, acetaminophen, LORazepam, polyethylene glycol, zolpidem  Social History:  reports that she has been smoking Cigarettes.  She has a 14 pack-year smoking history. She does not have any smokeless tobacco history on file. She reports that she does not drink alcohol or use illicit drugs.  Family History  Problem Relation Age of Onset  . Coronary artery disease Father   . Coronary artery disease Maternal Grandmother   . Breast cancer Mother   . Cancer Mother  Review of Systems:  Constitutional: Denies fever, chills, diaphoresis, appetite change and fatigue.  HEENT: Denies photophobia, eye pain, redness, hearing loss, ear pain, congestion, sore throat, rhinorrhea, sneezing, mouth sores, trouble swallowing, neck pain, neck stiffness and tinnitus.   Respiratory: Denies SOB, DOE, cough, chest tightness,  and wheezing.   Cardiovascular: Denies chest pain, palpitations and leg swelling.  Gastrointestinal: Deniesdiarrhea, constipation, blood in stool and  abdominal distention.  Genitourinary: Denies dysuria, urgency, frequency, hematuria, flank pain and difficulty urinating.  Endocrine: Denies: hot or cold intolerance, sweats, changes in hair or nails, polyuria, polydipsia. Musculoskeletal: Denies myalgias,joint swelling, arthralgias and gait problem.  Skin: Denies pallor, rash and wound.  Neurological: Denies dizziness, seizures, syncope, weakness, light-headedness, numbness and headaches.  Hematological: Denies adenopathy. Easy bruising, personal or family bleeding history  Psychiatric/Behavioral: Denies suicidal ideation, mood changes, confusion, nervousness, sleep disturbance and agitation   Physical Exam: Blood pressure 100/66, pulse 80, temperature 98.7 F (37.1 C), temperature source Oral, resp. rate 18, height 5\' 2"  (1.575 m), weight 61.236 kg (135 lb), last menstrual period 08/10/2011, SpO2 99.00%. General: Alert, awake, oriented x3, does not appear to be in any distress, appears somewhat withdrawn. HEENT: Normocephalic, atraumatic, pupils equal and reactive to light, sharp with movements intact, moist mucous membranes. Cardiovascular: Regular rate and rhythm, no murmurs, rubs or gallops. Lungs: Clear to auscultation bilaterally. Abdomen: Soft, nontender, nondistended, positive bowel sounds, no masses organomegaly noted. Extremities: No clubbing, cyanosis or edema, positive pedal pulses. Neurologic: Grossly intact and nonfocal.  Labs on Admission:  Results for orders placed during the hospital encounter of 07/06/13 (from the past 48 hour(s))  CBC     Status: Abnormal   Collection Time    07/06/13  8:47 AM      Result Value Ref Range   WBC 12.3 (*) 4.0 - 10.5 K/uL   RBC 4.41  3.87 - 5.11 MIL/uL   Hemoglobin 13.1  12.0 - 15.0 g/dL   HCT 07/08/13  89.4 - 24.0 %   MCV 87.5  78.0 - 100.0 fL   MCH 29.7  26.0 - 34.0 pg   MCHC 33.9  30.0 - 36.0 g/dL   RDW 64.2  94.2 - 49.9 %   Platelets 402 (*) 150 - 400 K/uL  ACETAMINOPHEN LEVEL      Status: None   Collection Time    07/06/13  8:48 AM      Result Value Ref Range   Acetaminophen (Tylenol), Serum <15.0  10 - 30 ug/mL   Comment:            THERAPEUTIC CONCENTRATIONS VARY     SIGNIFICANTLY. A RANGE OF 10-30     ug/mL MAY BE AN EFFECTIVE     CONCENTRATION FOR MANY PATIENTS.     HOWEVER, SOME ARE BEST TREATED     AT CONCENTRATIONS OUTSIDE THIS     RANGE.     ACETAMINOPHEN CONCENTRATIONS     >150 ug/mL AT 4 HOURS AFTER     INGESTION AND >50 ug/mL AT 12     HOURS AFTER INGESTION ARE     OFTEN ASSOCIATED WITH TOXIC     REACTIONS.  COMPREHENSIVE METABOLIC PANEL     Status: Abnormal   Collection Time    07/06/13  8:48 AM      Result Value Ref Range   Sodium 134 (*) 137 - 147 mEq/L   Potassium 3.6 (*) 3.7 - 5.3 mEq/L   Chloride 96  96 - 112 mEq/L   CO2 22  19 - 32 mEq/L   Glucose, Bld 115 (*) 70 - 99 mg/dL   BUN 15  6 - 23 mg/dL   Creatinine, Ser 0.61  0.50 - 1.10 mg/dL   Calcium 9.6  8.4 - 10.5 mg/dL   Total Protein 7.9  6.0 - 8.3 g/dL   Albumin 3.7  3.5 - 5.2 g/dL   AST 13  0 - 37 U/L   ALT 14  0 - 35 U/L   Alkaline Phosphatase 97  39 - 117 U/L   Total Bilirubin 0.5  0.3 - 1.2 mg/dL   GFR calc non Af Amer >90  >90 mL/min   GFR calc Af Amer >90  >90 mL/min   Comment: (NOTE)     The eGFR has been calculated using the CKD EPI equation.     This calculation has not been validated in all clinical situations.     eGFR's persistently <90 mL/min signify possible Chronic Kidney     Disease.  ETHANOL     Status: None   Collection Time    07/06/13  8:48 AM      Result Value Ref Range   Alcohol, Ethyl (B) <11  0 - 11 mg/dL   Comment:            LOWEST DETECTABLE LIMIT FOR     SERUM ALCOHOL IS 11 mg/dL     FOR MEDICAL PURPOSES ONLY  SALICYLATE LEVEL     Status: Abnormal   Collection Time    07/06/13  8:48 AM      Result Value Ref Range   Salicylate Lvl <1.4 (*) 2.8 - 20.0 mg/dL  URINE RAPID DRUG SCREEN (HOSP PERFORMED)     Status: Abnormal   Collection Time     07/06/13  9:10 AM      Result Value Ref Range   Opiates POSITIVE (*) NONE DETECTED   Cocaine POSITIVE (*) NONE DETECTED   Benzodiazepines NONE DETECTED  NONE DETECTED   Amphetamines NONE DETECTED  NONE DETECTED   Tetrahydrocannabinol NONE DETECTED  NONE DETECTED   Barbiturates NONE DETECTED  NONE DETECTED   Comment:            DRUG SCREEN FOR MEDICAL PURPOSES     ONLY.  IF CONFIRMATION IS NEEDED     FOR ANY PURPOSE, NOTIFY LAB     WITHIN 5 DAYS.                LOWEST DETECTABLE LIMITS     FOR URINE DRUG SCREEN     Drug Class       Cutoff (ng/mL)     Amphetamine      1000     Barbiturate      200     Benzodiazepine   782     Tricyclics       956     Opiates          300     Cocaine          300     THC              50  I-STAT CG4 LACTIC ACID, ED     Status: None   Collection Time    07/06/13  9:26 AM      Result Value Ref Range   Lactic Acid, Venous 1.67  0.5 - 2.2 mmol/L    Radiological Exams on Admission: Ct Abdomen Pelvis Wo Contrast  07/06/2013   CLINICAL DATA:  Vomiting and abdominal pain  EXAM: CT ABDOMEN AND PELVIS WITHOUT CONTRAST  TECHNIQUE: Multidetector CT imaging of the abdomen and pelvis was performed following the standard protocol without IV contrast.  COMPARISON:  Lumbar spine series of May 27, 2013  FINDINGS: The liver, gallbladder, pancreas, spleen, adrenal glands, and kidneys are normal. The caliber of the abdominal aorta is normal. There is no periaortic nor pericaval lymphadenopathy. The stomach, small bowel, and large bowel exhibit no acute abnormalities. The appendix is surgically absent. The urinary bladder is normal. The uterus is surgically absent. There are no adnexal masses. There is no free abdominal or pelvic fluid.  The bony pelvis and lumbar spine exhibit no acute abnormalities. There is mild posterior disc bulging at L5-S1. The lung bases are clear.  IMPRESSION: There is no acute intra-abdominal or pelvic abnormality. There is mild posterior disc  bulging at L5-S1.   Electronically Signed   By: David  Martinique   On: 07/06/2013 10:39    Assessment/Plan Principal Problem:   Suicidal ideation Active Problems:   Nausea & vomiting   Chronic pain disorder   Cocaine abuse   Nausea/vomiting -Suspect she may be  malingering in order to obtain pain medication. -Would not give any further pain medications while in the emergency department. -Will need to followup with her outpatient Omri Bertran who prescribes her medications on a monthly basis. -I see no signs of dehydration or electrolyte disturbance which I would expect to see in somebody with her amount of described GI issues. -Can use oral/rectal antiemetics as needed. -See no reason for inpatient admission at this time.  Suicidal ideation -Agree with psychiatry evaluation.  Chronic pain disorder -Will continue her chronic pain meds, but would not give her anything additional.  Cocaine abuse -She denies. -This will need to be communicated with her primary care Edmundo Tedesco as this will violate her pain contract.   Time Spent on Consultation: 65 minutes  [facility]  [phone] 07/06/2013, 4:42 PM

## 2013-07-06 NOTE — ED Notes (Signed)
I have just given report to Maudie Mercury, RN in [place] and she is moved there without incident.

## 2013-07-07 ENCOUNTER — Encounter (HOSPITAL_COMMUNITY): Payer: Self-pay | Admitting: *Deleted

## 2013-07-07 ENCOUNTER — Inpatient Hospital Stay (HOSPITAL_COMMUNITY)
Admission: AD | Admit: 2013-07-07 | Discharge: 2013-07-13 | DRG: 885 | Disposition: A | Discharge status: Home / Self Care | Payer: Medicaid Other | Source: Intra-hospital | Attending: Psychiatry | Admitting: Psychiatry

## 2013-07-07 DIAGNOSIS — K219 Gastro-esophageal reflux disease without esophagitis: Secondary | ICD-10-CM | POA: Diagnosis present

## 2013-07-07 DIAGNOSIS — G4700 Insomnia, unspecified: Secondary | ICD-10-CM | POA: Diagnosis present

## 2013-07-07 DIAGNOSIS — F17200 Nicotine dependence, unspecified, uncomplicated: Secondary | ICD-10-CM | POA: Diagnosis present

## 2013-07-07 DIAGNOSIS — F4320 Adjustment disorder, unspecified: Secondary | ICD-10-CM | POA: Diagnosis present

## 2013-07-07 DIAGNOSIS — F3289 Other specified depressive episodes: Secondary | ICD-10-CM

## 2013-07-07 DIAGNOSIS — F1920 Other psychoactive substance dependence, uncomplicated: Secondary | ICD-10-CM | POA: Diagnosis present

## 2013-07-07 DIAGNOSIS — R45851 Suicidal ideations: Secondary | ICD-10-CM

## 2013-07-07 DIAGNOSIS — F329 Major depressive disorder, single episode, unspecified: Secondary | ICD-10-CM

## 2013-07-07 DIAGNOSIS — F322 Major depressive disorder, single episode, severe without psychotic features: Secondary | ICD-10-CM

## 2013-07-07 DIAGNOSIS — Z5987 Material hardship due to limited financial resources, not elsewhere classified: Secondary | ICD-10-CM

## 2013-07-07 DIAGNOSIS — F411 Generalized anxiety disorder: Secondary | ICD-10-CM

## 2013-07-07 DIAGNOSIS — F333 Major depressive disorder, recurrent, severe with psychotic symptoms: Secondary | ICD-10-CM

## 2013-07-07 DIAGNOSIS — F1410 Cocaine abuse, uncomplicated: Secondary | ICD-10-CM

## 2013-07-07 DIAGNOSIS — Z598 Other problems related to housing and economic circumstances: Secondary | ICD-10-CM

## 2013-07-07 DIAGNOSIS — Z803 Family history of malignant neoplasm of breast: Secondary | ICD-10-CM

## 2013-07-07 DIAGNOSIS — Z8249 Family history of ischemic heart disease and other diseases of the circulatory system: Secondary | ICD-10-CM

## 2013-07-07 DIAGNOSIS — G894 Chronic pain syndrome: Secondary | ICD-10-CM | POA: Diagnosis present

## 2013-07-07 DIAGNOSIS — IMO0001 Reserved for inherently not codable concepts without codable children: Secondary | ICD-10-CM | POA: Diagnosis present

## 2013-07-07 MED ORDER — OXYCODONE HCL ER 10 MG PO T12A
80.0000 mg | EXTENDED_RELEASE_TABLET | Freq: Once | ORAL | Status: AC
  Administered 2013-07-07: 80 mg via ORAL
  Filled 2013-07-07: qty 8

## 2013-07-07 MED ORDER — VITAMIN B-1 100 MG PO TABS
100.0000 mg | ORAL_TABLET | Freq: Every day | ORAL | Status: DC
  Administered 2013-07-08 – 2013-07-13 (×6): 100 mg via ORAL
  Filled 2013-07-07 (×8): qty 1

## 2013-07-07 MED ORDER — CHLORDIAZEPOXIDE HCL 25 MG PO CAPS: 25.0000 mg | ORAL_CAPSULE | ORAL | Status: DC

## 2013-07-07 MED ORDER — CLONIDINE HCL 0.1 MG PO TABS
0.1000 mg | ORAL_TABLET | ORAL | Status: DC
  Filled 2013-07-07: qty 1

## 2013-07-07 MED ORDER — METHOCARBAMOL 500 MG PO TABS
500.0000 mg | ORAL_TABLET | Freq: Three times a day (TID) | ORAL | Status: AC | PRN
  Administered 2013-07-07 – 2013-07-11 (×3): 500 mg via ORAL
  Filled 2013-07-07 (×5): qty 1

## 2013-07-07 MED ORDER — CHLORDIAZEPOXIDE HCL 25 MG PO CAPS
25.0000 mg | ORAL_CAPSULE | Freq: Once | ORAL | Status: DC
  Filled 2013-07-07: qty 1

## 2013-07-07 MED ORDER — CHLORDIAZEPOXIDE HCL 25 MG PO CAPS: 25.0000 mg | ORAL_CAPSULE | Freq: Three times a day (TID) | ORAL | Status: DC

## 2013-07-07 MED ORDER — CHLORDIAZEPOXIDE HCL 25 MG PO CAPS
25.0000 mg | ORAL_CAPSULE | Freq: Once | ORAL | Status: DC
  Administered 2013-07-07: 25 mg via ORAL
  Filled 2013-07-07: qty 1

## 2013-07-07 MED ORDER — FLUOXETINE HCL 20 MG PO TABS
80.0000 mg | ORAL_TABLET | Freq: Every day | ORAL | Status: DC
  Administered 2013-07-08 – 2013-07-13 (×6): 80 mg via ORAL
  Filled 2013-07-07 (×7): qty 4

## 2013-07-07 MED ORDER — DICYCLOMINE HCL 20 MG PO TABS: 20.0000 mg | ORAL_TABLET | Freq: Four times a day (QID) | ORAL | Status: DC | PRN

## 2013-07-07 MED ORDER — POLYETHYLENE GLYCOL 3350 17 G PO PACK: 17.0000 g | PACK | Freq: Two times a day (BID) | ORAL | Status: DC | PRN

## 2013-07-07 MED ORDER — CHLORDIAZEPOXIDE HCL 25 MG PO CAPS
25.0000 mg | ORAL_CAPSULE | Freq: Four times a day (QID) | ORAL | Status: AC
  Administered 2013-07-07 – 2013-07-08 (×4): 25 mg via ORAL
  Filled 2013-07-07 (×4): qty 1

## 2013-07-07 MED ORDER — HYDROXYZINE HCL 25 MG PO TABS
25.0000 mg | ORAL_TABLET | Freq: Four times a day (QID) | ORAL | Status: AC | PRN
  Administered 2013-07-08 – 2013-07-10 (×4): 25 mg via ORAL
  Filled 2013-07-07 (×6): qty 1

## 2013-07-07 MED ORDER — CHLORDIAZEPOXIDE HCL 25 MG PO CAPS: 25.0000 mg | ORAL_CAPSULE | Freq: Every day | ORAL | Status: DC

## 2013-07-07 MED ORDER — CLONIDINE HCL 0.1 MG PO TABS: 0.1000 mg | ORAL_TABLET | Freq: Every day | ORAL | Status: DC

## 2013-07-07 MED ORDER — CHLORDIAZEPOXIDE HCL 25 MG PO CAPS: 25.0000 mg | ORAL_CAPSULE | Freq: Four times a day (QID) | ORAL | Status: DC | PRN

## 2013-07-07 MED ORDER — CHLORDIAZEPOXIDE HCL 25 MG PO CAPS
25.0000 mg | ORAL_CAPSULE | Freq: Four times a day (QID) | ORAL | Status: DC
  Administered 2013-07-07 (×2): 25 mg via ORAL
  Filled 2013-07-07 (×2): qty 1

## 2013-07-07 MED ORDER — LOPERAMIDE HCL 2 MG PO CAPS: 2.0000 mg | ORAL_CAPSULE | ORAL | Status: DC | PRN

## 2013-07-07 MED ORDER — THIAMINE HCL 100 MG/ML IJ SOLN
100.0000 mg | Freq: Once | INTRAMUSCULAR | Status: DC
  Administered 2013-07-07: 100 mg via INTRAMUSCULAR
  Filled 2013-07-07: qty 2

## 2013-07-07 MED ORDER — VITAMIN B-1 100 MG PO TABS
100.0000 mg | ORAL_TABLET | Freq: Every day | ORAL | Status: DC
Start: 2013-07-08 — End: 2013-07-07

## 2013-07-07 MED ORDER — ADULT MULTIVITAMIN W/MINERALS CH
1.0000 | ORAL_TABLET | Freq: Every day | ORAL | Status: DC
  Administered 2013-07-08 – 2013-07-13 (×6): 1 via ORAL
  Filled 2013-07-07 (×8): qty 1

## 2013-07-07 MED ORDER — MAGNESIUM HYDROXIDE 400 MG/5ML PO SUSP: 30.0000 mL | Freq: Every day | ORAL | Status: DC | PRN

## 2013-07-07 MED ORDER — CLONIDINE HCL 0.1 MG PO TABS
0.1000 mg | ORAL_TABLET | Freq: Four times a day (QID) | ORAL | Status: DC
  Administered 2013-07-07 – 2013-07-08 (×2): 0.1 mg via ORAL
  Filled 2013-07-07 (×12): qty 1

## 2013-07-07 MED ORDER — ACETAMINOPHEN 500 MG PO TABS
1000.0000 mg | ORAL_TABLET | Freq: Four times a day (QID) | ORAL | Status: DC | PRN
Start: 2013-07-07 — End: 2013-07-08
  Administered 2013-07-07: 1000 mg via ORAL
  Filled 2013-07-07: qty 2

## 2013-07-07 MED ORDER — THIAMINE HCL 100 MG/ML IJ SOLN: 100.0000 mg | Freq: Once | INTRAMUSCULAR | Status: DC

## 2013-07-07 MED ORDER — ADULT MULTIVITAMIN W/MINERALS CH
1.0000 | ORAL_TABLET | Freq: Every day | ORAL | Status: DC
  Administered 2013-07-07: 1 via ORAL
  Filled 2013-07-07: qty 1

## 2013-07-07 MED ORDER — ACETAMINOPHEN 325 MG PO TABS
650.0000 mg | ORAL_TABLET | ORAL | Status: DC | PRN
  Administered 2013-07-08: 650 mg via ORAL
  Filled 2013-07-07 (×3): qty 2

## 2013-07-07 MED ORDER — PANTOPRAZOLE SODIUM 40 MG PO TBEC
40.0000 mg | DELAYED_RELEASE_TABLET | Freq: Every day | ORAL | Status: DC
  Administered 2013-07-08 – 2013-07-13 (×6): 40 mg via ORAL
  Filled 2013-07-07 (×9): qty 1

## 2013-07-07 MED ORDER — NICOTINE 21 MG/24HR TD PT24
21.0000 mg | MEDICATED_PATCH | Freq: Every day | TRANSDERMAL | Status: DC
  Administered 2013-07-08 – 2013-07-13 (×6): 21 mg via TRANSDERMAL
  Filled 2013-07-07 (×8): qty 1

## 2013-07-07 NOTE — Progress Notes (Signed)
Pt alert and oriented. Pt depressed, hopeless, helpless and tearful. Pt c/o severe chronic back pain and states no relief from tylenol. +SI verbally contracts for safety. -HI, -[facility]  Pt requesting Oxycontin for pain. Will notify NP. Pt is  attention-seeking and intrusive during other patients conversations to staff. Will monitor closely and continue to evaluate for stabilization.

## 2013-07-07 NOTE — ED Notes (Signed)
Pt tearful about if her pain medications are going to be continued at [facility], and if Dr Lovena Le has talked with her Dr about them.

## 2013-07-07 NOTE — Progress Notes (Signed)
Patient ID: [name] [name], female   DOB: 07-25-1980, 33 y.o.   MRN: [id number] Admission Note-Sent to [facility] from [facility] for suicidal ideation and complaint of chronic pain. She states she has had disc problems in her neck for three years but its has gotten worse and she is pending surgery. She is seeking Oxycodone because is helps her get her pain level done to a 4 of 5 and it is currently at a 10.She is tearful re her pain. She lives with what she describes as a supportive family but is applying for disablility and has an attorney since she has been declined three times prior.Is pleasant and cooperative but very focused on her pain medications and making multiple suggestions and explanations for how we can get them for her.Asking for a Dr to come in and see her tonight. Declined dinner, states she hasnt eaten in three days.Had a cup of ice cream and a drink.Skin search done and oriented to the unit.

## 2013-07-07 NOTE — ED Notes (Addendum)
Up walking in the hall

## 2013-07-07 NOTE — Progress Notes (Signed)
Manus Gunning NP notified of pt request for seroquel. No new order for antipsychotic given. Pt denies A/[facility] at present "they come and go".

## 2013-07-07 NOTE — ED Notes (Signed)
OK to transport per [employer]

## 2013-07-07 NOTE — ED Notes (Signed)
Dr Lovena Le and Delphia Grates into see

## 2013-07-07 NOTE — Consult Note (Signed)
[facility] Consult   Reason for Consult:  Suicidal ideation Referring Physician:  EDP  [name] [name] is an 33 y.o. female. Total Time spent with patient: 45 minutes  Assessment: AXIS I:  Anxiety Disorder NOS, Depressive Disorder NOS and Substance Abuse AXIS II:  Deferred AXIS III:   Past Medical History  Diagnosis Date  . Migraine   . GERD (gastroesophageal reflux disease)     protonix evenings  . Endometriosis     chronic pain  . Anxiety     klonipin for stress disorder  . Degenerative disc disease    AXIS IV:  other psychosocial or environmental problems AXIS V:  11-20 some danger of hurting self or others possible OR occasionally fails to maintain minimal personal hygiene OR gross impairment in communication  Plan:  Recommend psychiatric Inpatient admission when medically cleared.  Subjective:   [name] [name] is a 33 y.o. female patient admitted with Major Depressive Disorder.  HPI:  Patient states that she is still feeling suicidal.  "Yes I still feel suicidal. I am suppose to be in mental court today for obtaining property through forgery; I called them but they said I needed to get someone here to call.  Can I have my pain medicine." Discussed with patient that we had spoken with her doctor (Dr. Welton Flakes) and that she has missed two appointments to come in and have medications counted and that they will no longer prescribe medications.  Informed patient that I would start the Librium and Clonidine protocols.  "Can I call my doctor and ask him if I can keep taking my medicine." Patient in hall crying on phone asking for help and wanting to get pain medication.   HPI Elements:   Location:  suicidal ideation. Quality:  plan to overdose. Severity:  plan to overdose. Timing:  2 days. Review of Systems  Constitutional: Positive for diaphoresis.  Gastrointestinal: Positive for nausea and abdominal pain.  Musculoskeletal: Positive for back pain.  Neurological:  Positive for tremors. Negative for headaches.  Psychiatric/Behavioral: Positive for depression, suicidal ideas and substance abuse. Negative for hallucinations. The patient is nervous/anxious.     Family History  Problem Relation Age of Onset  . Coronary artery disease Father   . Coronary artery disease Maternal Grandmother   . Breast cancer Mother   . Cancer Mother     Past Psychiatric History: Past Medical History  Diagnosis Date  . Migraine   . GERD (gastroesophageal reflux disease)     protonix evenings  . Endometriosis     chronic pain  . Anxiety     klonipin for stress disorder  . Degenerative disc disease     reports that she has been smoking Cigarettes.  She has a 14 pack-year smoking history. She does not have any smokeless tobacco history on file. She reports that she does not drink alcohol or use illicit drugs. Family History  Problem Relation Age of Onset  . Coronary artery disease Father   . Coronary artery disease Maternal Grandmother   . Breast cancer Mother   . Cancer Mother            Allergies:   Allergies  Allergen Reactions  . Epidural Tray 17gx3-1-2" [Nerve Block Tray] Other (See Comments)    Paralysis and severe pain in head/neck/shoulders.  No anaphylaxis.  . Ibuprofen Hives  . Penicillins Hives  . Zofran [Ondansetron Hcl] Nausea And Vomiting    ACT Assessment Complete:  Yes:    Educational Status  Risk to Self: Risk to self Is patient at risk for suicide?: Yes Substance abuse history and/or treatment for substance abuse?: Yes  Risk to Others:    Abuse:    Prior Inpatient Therapy:    Prior Outpatient Therapy:    Additional Information:                    Objective: Blood pressure 108/71, pulse 68, temperature 98.7 F (37.1 C), temperature source Oral, resp. rate 18, height $RemoveBe'5\' 2"'eKyCQmAMY$  (1.575 m), weight 61.236 kg (135 lb), last menstrual period 08/10/2011, SpO2 97.00%.Body mass index is 24.69 kg/(m^2). Results for orders  placed during the hospital encounter of 07/06/13 (from the past 72 hour(s))  CBC     Status: Abnormal   Collection Time    07/06/13  8:47 AM      Result Value Ref Range   WBC 12.3 (*) 4.0 - 10.5 K/uL   RBC 4.41  3.87 - 5.11 MIL/uL   Hemoglobin 13.1  12.0 - 15.0 g/dL   HCT 38.6  36.0 - 46.0 %   MCV 87.5  78.0 - 100.0 fL   MCH 29.7  26.0 - 34.0 pg   MCHC 33.9  30.0 - 36.0 g/dL   RDW 14.4  11.5 - 15.5 %   Platelets 402 (*) 150 - 400 K/uL  ACETAMINOPHEN LEVEL     Status: None   Collection Time    07/06/13  8:48 AM      Result Value Ref Range   Acetaminophen (Tylenol), Serum <15.0  10 - 30 ug/mL   Comment:            THERAPEUTIC CONCENTRATIONS VARY     SIGNIFICANTLY. A RANGE OF 10-30     ug/mL MAY BE AN EFFECTIVE     CONCENTRATION FOR MANY PATIENTS.     HOWEVER, SOME ARE BEST TREATED     AT CONCENTRATIONS OUTSIDE THIS     RANGE.     ACETAMINOPHEN CONCENTRATIONS     >150 ug/mL AT 4 HOURS AFTER     INGESTION AND >50 ug/mL AT 12     HOURS AFTER INGESTION ARE     OFTEN ASSOCIATED WITH TOXIC     REACTIONS.  COMPREHENSIVE METABOLIC PANEL     Status: Abnormal   Collection Time    07/06/13  8:48 AM      Result Value Ref Range   Sodium 134 (*) 137 - 147 mEq/L   Potassium 3.6 (*) 3.7 - 5.3 mEq/L   Chloride 96  96 - 112 mEq/L   CO2 22  19 - 32 mEq/L   Glucose, Bld 115 (*) 70 - 99 mg/dL   BUN 15  6 - 23 mg/dL   Creatinine, Ser 0.61  0.50 - 1.10 mg/dL   Calcium 9.6  8.4 - 10.5 mg/dL   Total Protein 7.9  6.0 - 8.3 g/dL   Albumin 3.7  3.5 - 5.2 g/dL   AST 13  0 - 37 U/L   ALT 14  0 - 35 U/L   Alkaline Phosphatase 97  39 - 117 U/L   Total Bilirubin 0.5  0.3 - 1.2 mg/dL   GFR calc non Af Amer >90  >90 mL/min   GFR calc Af Amer >90  >90 mL/min   Comment: (NOTE)     The eGFR has been calculated using the CKD EPI equation.     This calculation has not been validated in all clinical situations.     eGFR's  persistently <90 mL/min signify possible Chronic Kidney     Disease.  ETHANOL      Status: None   Collection Time    07/06/13  8:48 AM      Result Value Ref Range   Alcohol, Ethyl (B) <11  0 - 11 mg/dL   Comment:            LOWEST DETECTABLE LIMIT FOR     SERUM ALCOHOL IS 11 mg/dL     FOR MEDICAL PURPOSES ONLY  SALICYLATE LEVEL     Status: Abnormal   Collection Time    07/06/13  8:48 AM      Result Value Ref Range   Salicylate Lvl <9.8 (*) 2.8 - 20.0 mg/dL  URINE RAPID DRUG SCREEN (HOSP PERFORMED)     Status: Abnormal   Collection Time    07/06/13  9:10 AM      Result Value Ref Range   Opiates POSITIVE (*) NONE DETECTED   Cocaine POSITIVE (*) NONE DETECTED   Benzodiazepines NONE DETECTED  NONE DETECTED   Amphetamines NONE DETECTED  NONE DETECTED   Tetrahydrocannabinol NONE DETECTED  NONE DETECTED   Barbiturates NONE DETECTED  NONE DETECTED   Comment:            DRUG SCREEN FOR MEDICAL PURPOSES     ONLY.  IF CONFIRMATION IS NEEDED     FOR ANY PURPOSE, NOTIFY LAB     WITHIN 5 DAYS.                LOWEST DETECTABLE LIMITS     FOR URINE DRUG SCREEN     Drug Class       Cutoff (ng/mL)     Amphetamine      1000     Barbiturate      200     Benzodiazepine   338     Tricyclics       250     Opiates          300     Cocaine          300     THC              50  I-STAT CG4 LACTIC ACID, ED     Status: None   Collection Time    07/06/13  9:26 AM      Result Value Ref Range   Lactic Acid, Venous 1.67  0.5 - 2.2 mmol/L   Labs are reviewed see above.  Medications reviewed and verified by primary doctor Dr. Welton Flakes will discontinue Klonopin, Pain medication, and Vyvance.  Starting Librium and Clonidine protocols for Benzo and Opiate withdrawal.  Current Facility-Administered Medications  Medication Dose Route Frequency Taiylor Virden Last Rate Last Dose  . acetaminophen (TYLENOL) tablet 1,000 mg  1,000 mg Oral Q6H PRN Osvaldo Shipper, MD   1,000 mg at 07/06/13 1653  . acetaminophen (TYLENOL) tablet 650 mg  650 mg Oral Q4H PRN Osvaldo Shipper, MD      .  chlordiazePOXIDE (LIBRIUM) capsule 25 mg  25 mg Oral Q6H PRN Shuvon Rankin, NP      . chlordiazePOXIDE (LIBRIUM) capsule 25 mg  25 mg Oral Once Shuvon Rankin, NP      . chlordiazePOXIDE (LIBRIUM) capsule 25 mg  25 mg Oral QID Shuvon Rankin, NP       Followed by  . [START ON 07/08/2013] chlordiazePOXIDE (LIBRIUM) capsule 25 mg  25 mg Oral TID Earleen Newport, NP  Followed by  . [START ON 07/09/2013] chlordiazePOXIDE (LIBRIUM) capsule 25 mg  25 mg Oral BH-qamhs Shuvon Rankin, NP       Followed by  . [START ON 07/10/2013] chlordiazePOXIDE (LIBRIUM) capsule 25 mg  25 mg Oral Daily Shuvon Rankin, NP      . cloNIDine (CATAPRES) tablet 0.1 mg  0.1 mg Oral QID Jasper Riling. Pickering, MD   0.1 mg at 07/06/13 2114   Followed by  . [START ON 07/09/2013] cloNIDine (CATAPRES) tablet 0.1 mg  0.1 mg Oral BH-qamhs Nathan R. Alvino Chapel, MD       Followed by  . [START ON 07/12/2013] cloNIDine (CATAPRES) tablet 0.1 mg  0.1 mg Oral QAC breakfast Jasper Riling. Pickering, MD      . dicyclomine (BENTYL) tablet 20 mg  20 mg Oral Q6H PRN Jasper Riling. Pickering, MD      . FLUoxetine (PROZAC) capsule 80 mg  80 mg Oral Daily Osvaldo Shipper, MD   80 mg at 07/07/13 1000  . hydrOXYzine (ATARAX/VISTARIL) tablet 25 mg  25 mg Oral Q6H PRN Jasper Riling. Pickering, MD      . loperamide (IMODIUM) capsule 2-4 mg  2-4 mg Oral PRN Jasper Riling. Pickering, MD      . methocarbamol (ROBAXIN) tablet 500 mg  500 mg Oral Q8H PRN Jasper Riling. Pickering, MD      . multivitamin with minerals tablet 1 tablet  1 tablet Oral Daily Shuvon Rankin, NP      . nicotine (NICODERM CQ - dosed in mg/24 hours) patch 21 mg  21 mg Transdermal Daily Osvaldo Shipper, MD   21 mg at 07/07/13 1003  . pantoprazole (PROTONIX) EC tablet 40 mg  40 mg Oral Daily Osvaldo Shipper, MD   40 mg at 07/07/13 1002  . polyethylene glycol (MIRALAX / GLYCOLAX) packet 17 g  17 g Oral BID PRN Osvaldo Shipper, MD      . thiamine (B-1) injection 100 mg  100 mg Intramuscular Once  Shuvon Rankin, NP      . Derrill Memo ON 07/08/2013] thiamine (VITAMIN B-1) tablet 100 mg  100 mg Oral Daily Shuvon Rankin, NP       Current Outpatient Prescriptions  Medication Sig Dispense Refill  . acetaminophen (TYLENOL) 500 MG tablet Take 2,000 mg by mouth every 6 (six) hours as needed for mild pain or headache.      . clonazePAM (KLONOPIN) 1 MG tablet Take 0.5-1 mg by mouth 3 (three) times daily.       Marland Kitchen FLUoxetine (PROZAC) 40 MG capsule Take 80 mg by mouth daily.      Marland Kitchen lisdexamfetamine (VYVANSE) 60 MG capsule Take 60 mg by mouth daily after breakfast.      . OxyCODONE (OXYCONTIN) 80 mg T12A 12 hr tablet Take 80 mg by mouth 3 (three) times daily.      Marland Kitchen oxymorphone (OPANA) 10 MG tablet Take 10 mg by mouth every 8 (eight) hours as needed (for break through pain).      . pantoprazole (PROTONIX) 40 MG tablet Take 40 mg by mouth daily.      . polyethylene glycol (MIRALAX / GLYCOLAX) packet Take 17 g by mouth 2 (two) times daily as needed (constipation).       . promethazine (PHENERGAN) 25 MG suppository Place 1 suppository (25 mg total) rectally every 6 (six) hours as needed for nausea or vomiting.  12 each  0    Psychiatric Specialty Exam:     Blood pressure  108/71, pulse 68, temperature 98.7 F (37.1 C), temperature source Oral, resp. rate 18, height $RemoveBe'5\' 2"'dcOdhYBoG$  (1.575 m), weight 61.236 kg (135 lb), last menstrual period 08/10/2011, SpO2 97.00%.Body mass index is 24.69 kg/(m^2).  General Appearance: Casual  Eye Contact::  Good  Speech:  Clear and Coherent  Volume:  Normal  Mood:  Depressed  Affect:  Congruent  Thought Process:  Circumstantial  Orientation:  Full (Time, Place, and Person)  Thought Content:  Rumination  Suicidal Thoughts:  Yes.  with intent/plan  Homicidal Thoughts:  No  Memory:  Immediate;   Good Recent;   Good Remote;   Good  Judgement:  Poor  Insight:  Lacking  Psychomotor Activity:  Tremor  Concentration:  Fair  Recall:  Good  Fund of Knowledge:Good  Language: Good   Akathisia:  No  Handed:  Right  AIMS (if indicated):     Assets:  Communication Skills Desire for Improvement  Sleep:      Musculoskeletal: Strength & Muscle Tone: within normal limits Gait & Station: normal Patient leans: N/A  Treatment Plan Summary: Observation overnight.    Patient accepted to [facility] Observation unit bed 2.  Will continue to monitor for safety and stabilization until transferred to [facility].   Rankin, Shuvon, FNP-BC 07/07/2013 11:23 AM

## 2013-07-07 NOTE — Progress Notes (Signed)
[place] INPATIENT:  Family/Significant Other Suicide Prevention Education  Suicide Prevention Education:  Patient Refusal for Family/Significant Other Suicide Prevention Education: The patient [name] [name] has refused to provide written consent for family/significant other to be provided Family/Significant Other Suicide Prevention Education during admission and/or prior to discharge.  Physician notified.  Davina Poke 07/07/2013, 6:42 PM

## 2013-07-07 NOTE — ED Notes (Signed)
Up to the desk on the phone 

## 2013-07-07 NOTE — ED Notes (Signed)
OBS unit contacted transport around 1530

## 2013-07-07 NOTE — ED Notes (Signed)
Up in hall walking around 

## 2013-07-07 NOTE — ED Notes (Signed)
[facility] here to transport

## 2013-07-07 NOTE — ED Notes (Signed)
Up to the bathroom 

## 2013-07-07 NOTE — Progress Notes (Signed)
T/C Manus Gunning NP made aware of pt pain and request for oxycontin.

## 2013-07-07 NOTE — Progress Notes (Signed)
Patient ID: [name] [name], female   DOB: Dec 31, 1980, 33 y.o.   MRN: [id number] Verified with [facility] on [address] in [place] her medications for pain which are Oxymorphone 10 mg q 8 hours prn and Oxycontin 80 mg q 8 hours prn for pain written by Dr. Claris Gower and gave her a 34 day supply and last filled on 06/11/2013. She states she was sent here for suicidal ideation with a plan to overdose, and that the ED told her she wouldn't be given Oxycodone while here so they have her on an opiate protocol She has no intention of discontinuing her medications, she needs them for pain and denies abusing them. She is pending surgery on her back in the future. Gave her Robaxin and Tylenol for pain as ordered and results are poor, from a pain of 10 to "maybe a 9"

## 2013-07-07 NOTE — ED Notes (Signed)
Pt ambulatory w/o to [facility]  OBS w/ [facility], belongings sent w/ driver.

## 2013-07-07 NOTE — ED Notes (Signed)
     [facility] contacted for transport

## 2013-07-07 NOTE — Progress Notes (Signed)
Pt complaining of voices on/off saying "If the pain don't go away I can just die". Requesting Seroquel "they told me I can use that". Will notify NP.

## 2013-07-07 NOTE — Progress Notes (Signed)
Pt accepted to obs bed 2. MHT assisting with support paperwork. Pt to be transported voluntarily.   Noreene Larsson [phone]  ED CSW 07/07/2013 1158am

## 2013-07-07 NOTE — ED Notes (Signed)
Give Oxycodone as scheduled, hold clonidine dose Aggie Hacker NP

## 2013-07-08 DIAGNOSIS — F1994 Other psychoactive substance use, unspecified with psychoactive substance-induced mood disorder: Secondary | ICD-10-CM

## 2013-07-08 DIAGNOSIS — F1920 Other psychoactive substance dependence, uncomplicated: Secondary | ICD-10-CM | POA: Diagnosis present

## 2013-07-08 DIAGNOSIS — F4321 Adjustment disorder with depressed mood: Secondary | ICD-10-CM

## 2013-07-08 DIAGNOSIS — F4323 Adjustment disorder with mixed anxiety and depressed mood: Secondary | ICD-10-CM

## 2013-07-08 MED ORDER — TRAZODONE HCL 50 MG PO TABS
25.0000 mg | ORAL_TABLET | Freq: Every evening | ORAL | Status: DC | PRN
  Administered 2013-07-08 – 2013-07-12 (×5): 25 mg via ORAL
  Filled 2013-07-08 (×4): qty 1
  Filled 2013-07-08: qty 2

## 2013-07-08 MED ORDER — OXYCODONE HCL ER 10 MG PO T12A
80.0000 mg | EXTENDED_RELEASE_TABLET | Freq: Once | ORAL | Status: AC
  Administered 2013-07-08: 80 mg via ORAL
  Filled 2013-07-08: qty 8

## 2013-07-08 MED ORDER — OXYCODONE HCL ER 40 MG PO T12A
40.0000 mg | EXTENDED_RELEASE_TABLET | Freq: Three times a day (TID) | ORAL | Status: DC
  Administered 2013-07-08 – 2013-07-09 (×3): 40 mg via ORAL
  Filled 2013-07-08 (×3): qty 1

## 2013-07-08 MED ORDER — OXYCODONE HCL ER 40 MG PO T12A: 40.0000 mg | EXTENDED_RELEASE_TABLET | Freq: Two times a day (BID) | ORAL | Status: DC

## 2013-07-08 NOTE — Tx Team (Signed)
Initial Interdisciplinary Treatment Plan  PATIENT STRENGTHS: (choose at least two) Ability for insight Average or above average intelligence General fund of knowledge  PATIENT STRESSORS: Health problems   PROBLEM LIST: Problem List/Patient Goals Date to be addressed Date deferred Reason deferred Estimated date of resolution  depression 07/08/2013     suiccidal ideations 07/08/2013                                                DISCHARGE CRITERIA:  Improved stabilization in mood, thinking, and/or behavior Need for constant or close observation no longer present Reduction of life-threatening or endangering symptoms to within safe limits  PRELIMINARY DISCHARGE PLAN: Outpatient therapy Return to previous living arrangement  PATIENT/FAMIILY INVOLVEMENT: This treatment plan has been presented to and reviewed with the patient, [name] [name].  The patient and family have been given the opportunity to ask questions and make suggestions.  Marilynne Halsted M 07/08/2013, 1:25 PM

## 2013-07-08 NOTE — BHH Group Notes (Signed)
[place] LCSW Group Therapy  07/08/2013 2:54 PM  Type of Therapy:  Group Therapy  Participation Level:  Did Not Attend   Summary of Progress/Problems: Feelings around Relapse. Group members discussed the meaning of relapse and shared personal stories of relapse, how it affected them and others, and how they perceived themselves during this time. Group members were encouraged to identify triggers, warning signs and coping skills used when facing the possibility of relapse. Social supports were discussed and explored in detail.   Did not attend. Newly admitted.   Sheilah Mins 07/08/2013, 2:54 PM

## 2013-07-08 NOTE — Progress Notes (Signed)
Patient ID: [name] [name], female   DOB: [birth year], 32 y.o.   MRN: [id number] D-When woke this am complained of pain in hip and back and asking for the Dr. To get medications for today. She was given a one time order of Oxycodone last HS and is seeking it today. She is reporting her pain at a 10. Given Robaxin and Tylenol for pain which is what is available for now. Reports no benefit from it.Gave her a heat pack too.Did not eat breakfast but did drink a glass of Coke. Pleasant but tearful and demanding.

## 2013-07-08 NOTE — Consult Note (Signed)
Face to face evaluation and I agree with this note 

## 2013-07-08 NOTE — Progress Notes (Signed)
Patient ID: [name] [name], female   DOB: 10-04-1980, 33 y.o.   MRN: [id number] Patient came SI but contracts for safety; patient has been asking for her pain medications that she takes for her back; reports was given to [employer]
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
## 2013-07-08 NOTE — Progress Notes (Signed)
Patient ID: [name] [name], female   DOB: 1980/08/03, 33 y.o.   MRN: [id number] Seen by Dr. Dwyane Dee and Juanda Crumble NP and recommended she be admitted to the unit, 500 hall, 507-2. She is satisfied to be going to a unit, she feels she needs it. She maintains that she is still suicidal due to her grief regarding her belief that she has a diminished ability to be a good parent to her three kids and that she was told by her Dr. She would be in a wheelchair in 8 years and that is making her sad.She has plan to overdose but is able to contract for safety while here.NP told her he would order her Oxycodone and wanted writer to get him a copy of her medications from her pharmacy. Called [facility] to make that request and they said they would fax it.Transferred to unit.

## 2013-07-08 NOTE — Progress Notes (Signed)
Patient's am V/S low, writer gave patient large cup pf Gatorade and encouraged her to drink the whole cup. Will report of to the incoming RN to recheck after an hour. Patient denied dizziness or weakness, rated her pain at 4 on a scale of 1-19. Q 15 minute continues to maintain safety

## 2013-07-08 NOTE — BH Assessment (Signed)
[facility] Assessment Progress Note  Per Darlyne Russian, PA, pt is to be admitted to [facility].  Pt assigned to the service of Dr Parke Poisson, Rm [id number].  Gwen, MHT had pt sign Voluntary Admission and Consent for Treatment.  This Probation officer had pt sign Consent for Release of Information.  Jalene Mullet, MA Triage Specialist 07/08/2013 @ 12:31

## 2013-07-08 NOTE — H&P (Signed)
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 Because of her medical condition with DDD  of her spines and having recently been told she would be unable to walk on her own and be in a wheelchair in 8 yrs.Ed physician felt pt was probably suicidal from lack of pain control but consulted Dr Marlyne Beards and she was admitted to Observation where she her mood and thinking have not improved Elements:  Location:  [facility] ED and [facility]. Severity:  Problems are severe. Timing:  Problem iscontinuous with intermittent exacerbations. Duration:  Lifetime. Associated Signs/Synptoms: Depression Symptoms:  depressed mood, feelings of worthlessness/guilt, hopelessness, suicidal thoughts with specific plan, anxiety, (Hypo) Manic Symptoms:  NA Anxiety Symptoms:  Excessive Worry, Psychotic Symptoms:  NA PTSD Symptoms: Had a traumatic exposure in the last month:  when informed she would be paralyzed /wheel chair bound in 8 years Total Time spent with patient: 30 minutes  Psychiatric Specialty Exam: Physical Exam  Constitutional: She is oriented to person, place, and time. She appears well-developed and well-nourished. She appears distressed.  HENT:  Head: Normocephalic and atraumatic.  Right Ear: External ear normal.  Left Ear: External ear normal.  Nose: Nose normal.  Mouth/Throat: Oropharynx is clear and moist.  Eyes: Conjunctivae and EOM are normal. Pupils are equal, round, and reactive to light. Right eye  exhibits no discharge. Left eye exhibits no discharge. No scleral icterus.  Neck: No JVD present. No tracheal deviation present. No thyromegaly present.  Cardiovascular: Normal rate and regular rhythm.  Exam reveals no gallop and no friction rub.   No murmur heard. Respiratory: Effort normal and breath sounds normal. No stridor. No respiratory distress. She has no wheezes. She has no rales.  GI: Soft.  Genitourinary:  Deferred  Musculoskeletal: She exhibits tenderness (Diffuse spinal tenderness).  Lymphadenopathy:    She has no cervical adenopathy.  Neurological: She is alert and oriented to person, place, and time. She displays abnormal reflex. No cranial nerve deficit. She exhibits abnormal muscle tone. Coordination abnormal.  Skin: Skin is warm and dry. No rash noted. She is not diaphoretic.  Psychiatric:  SEE MSE Below    Review of Systems  Constitutional: Positive for malaise/fatigue. Negative for fever, chills, weight loss and diaphoresis.  Gastrointestinal: Positive for nausea and vomiting. Negative for diarrhea, constipation, blood in stool and melena.  Neurological: Positive for sensory change and weakness.       Bilateral sciatica  Psychiatric/Behavioral: Positive for depression, suicidal ideas and substance abuse. Negative for hallucinations. The patient is nervous/anxious and has insomnia.   All other systems reviewed and are negative.   Blood pressure 88/58, pulse 64, temperature 97.8 F (36.6 C), temperature source Oral, resp. rate 16, height 5\' 2"  (1.575 m), weight 61.236 kg (135 lb), last menstrual period 08/10/2011.Body mass index is 24.69 kg/(m^2).  General Appearance: Disheveled  Eye 08/12/2011::  Fair  Speech:  Clear and Coherent  Volume:  Decreased  Mood:  Depressed, Dysphoric, Hopeless and Worthless  Affect:  Congruent  Thought Process:  comprehensible  Orientation:  Full (Time, Place, and Person)  Thought Content:  WDL and Rumination  Suicidal Thoughts:  Yes.   with intent/plan-jumping in front of moving vehicle  Homicidal Thoughts:  No  Memory:  Immediate;   Good  Judgement:  Impaired  Insight:  Lacking  Psychomotor Activity:  Decreased  Concentration:  Fair  Recall:  Good  Fund of Knowledge:Good  Language: Good  Akathisia:  NA  Handed:  Right  AIMS (if indicated):  NA  Assets:  Financial Resources/Insurance  Sleep:  disturbed    Musculoskeletal: Strength & Muscle Tone: decreased Gait & Station: unsteady Patient leans: Lies on rt side  Past Psychiatric History: Diagnosis:12/2011 Opiate dependence  Hospitalizations:[facility] 12/13  Outpatient Care:Not recorded-currently in pain mngmnt  Substance Abuse Care:[facility] IP 2013  Self-Mutilation:NA  Suicidal Attempts:NO  Violent Behaviors:NA   Past Medical History:   Past Medical History  Diagnosis Date  . Migraine   . GERD (gastroesophageal reflux disease)     protonix evenings  . Endometriosis     chronic pain  . Anxiety     klonipin for stress disorder  . Degenerative disc disease        Chronic pain syndrome None.except chronic pain Allergies:   Allergies  Allergen Reactions  . Epidural Tray 17gx3-1-2" [Nerve Block Tray] Other (See Comments)    Paralysis and severe pain in head/neck/shoulders.  No anaphylaxis.  . Ibuprofen Hives  . Penicillins Hives  . Zofran [Ondansetron Hcl] Nausea And Vomiting   PTA Medications: Prescriptions prior to admission  Medication Sig Dispense Refill  . acetaminophen (TYLENOL) 500 MG tablet Take 2,000 mg by mouth every 6 (six) hours as needed for mild pain or headache.      Marland Kitchen FLUoxetine (PROZAC) 40 MG capsule Take 80 mg by mouth daily.      . pantoprazole (PROTONIX) 40 MG tablet Take 40 mg by mouth daily.      . polyethylene glycol (MIRALAX / GLYCOLAX) packet Take 17 g by mouth 2 (two) times daily as needed (constipation).       . promethazine (PHENERGAN) 25 MG suppository Place 1 suppository (25 mg total) rectally every 6 (six) hours as needed for  nausea or vomiting.  12 each  0    Previous Psychotropic Medications:  Medication/Dose  See Meds               Substance Abuse History in the last 12 months:  yes  Consequences of Substance Abuse: Medical Consequences:  complicating chronic pain treatments/SIMD/Depression  Social History:  reports that she has been smoking Cigarettes.  She has a 14 pack-year smoking history. She does not have any smokeless tobacco history on file. She reports that she does not drink alcohol or use illicit drugs. Additional Social History:                      Current Place of Residence:   Place of Birth:   Family Members: Marital Status:  Married Children:2  Sons:  Daughters: Relationships:pt feels family would be better off without her Education:  [employer] Graduate Educational Problems/Performance: Religious Beliefs/Practices:Christian History of Abuse (Emotional/Phsycial/Sexual)None reported Armed forces technical officer;Disabled Military History:  None. Legal History:None reported Hobbies/Interests:Anhedonic/disabled  Family History:   Family History  Problem Relation Age of Onset  . Coronary artery disease Father   . Coronary artery disease Maternal Grandmother   . Breast cancer Mother   . Cancer Mother     Results for orders placed during the hospital encounter of 07/06/13 (from  the past 72 hour(s))  CBC     Status: Abnormal   Collection Time    07/06/13  8:47 AM      Result Value Ref Range   WBC 12.3 (*) 4.0 - 10.5 K/uL   RBC 4.41  3.87 - 5.11 MIL/uL   Hemoglobin 13.1  12.0 - 15.0 g/dL   HCT 23.5  77.5 - 61.9 %   MCV 87.5  78.0 - 100.0 fL   MCH 29.7  26.0 - 34.0 pg   MCHC 33.9  30.0 - 36.0 g/dL   RDW 71.8  56.9 - 26.9 %   Platelets 402 (*) 150 - 400 K/uL  ACETAMINOPHEN LEVEL     Status: None   Collection Time    07/06/13  8:48 AM      Result Value Ref Range   Acetaminophen (Tylenol), Serum <15.0  10 - 30 ug/mL   Comment:            THERAPEUTIC CONCENTRATIONS  VARY     SIGNIFICANTLY. A RANGE OF 10-30     ug/mL MAY BE AN EFFECTIVE     CONCENTRATION FOR MANY PATIENTS.     HOWEVER, SOME ARE BEST TREATED     AT CONCENTRATIONS OUTSIDE THIS     RANGE.     ACETAMINOPHEN CONCENTRATIONS     >150 ug/mL AT 4 HOURS AFTER     INGESTION AND >50 ug/mL AT 12     HOURS AFTER INGESTION ARE     OFTEN ASSOCIATED WITH TOXIC     REACTIONS.  COMPREHENSIVE METABOLIC PANEL     Status: Abnormal   Collection Time    07/06/13  8:48 AM      Result Value Ref Range   Sodium 134 (*) 137 - 147 mEq/L   Potassium 3.6 (*) 3.7 - 5.3 mEq/L   Chloride 96  96 - 112 mEq/L   CO2 22  19 - 32 mEq/L   Glucose, Bld 115 (*) 70 - 99 mg/dL   BUN 15  6 - 23 mg/dL   Creatinine, Ser 9.78  0.50 - 1.10 mg/dL   Calcium 9.6  8.4 - 74.6 mg/dL   Total Protein 7.9  6.0 - 8.3 g/dL   Albumin 3.7  3.5 - 5.2 g/dL   AST 13  0 - 37 U/L   ALT 14  0 - 35 U/L   Alkaline Phosphatase 97  39 - 117 U/L   Total Bilirubin 0.5  0.3 - 1.2 mg/dL   GFR calc non Af Amer >90  >90 mL/min   GFR calc Af Amer >90  >90 mL/min   Comment: (NOTE)     The eGFR has been calculated using the CKD EPI equation.     This calculation has not been validated in all clinical situations.     eGFR's persistently <90 mL/min signify possible Chronic Kidney     Disease.  ETHANOL     Status: None   Collection Time    07/06/13  8:48 AM      Result Value Ref Range   Alcohol, Ethyl (B) <11  0 - 11 mg/dL   Comment:            LOWEST DETECTABLE LIMIT FOR     SERUM ALCOHOL IS 11 mg/dL     FOR MEDICAL PURPOSES ONLY  SALICYLATE LEVEL     Status: Abnormal   Collection Time    07/06/13  8:48 AM      Result Value Ref Range  Salicylate Lvl <9.4 (*) 2.8 - 20.0 mg/dL  URINE RAPID DRUG SCREEN (HOSP PERFORMED)     Status: Abnormal   Collection Time    07/06/13  9:10 AM      Result Value Ref Range   Opiates POSITIVE (*) NONE DETECTED   Cocaine POSITIVE (*) NONE DETECTED   Benzodiazepines NONE DETECTED  NONE DETECTED   Amphetamines  NONE DETECTED  NONE DETECTED   Tetrahydrocannabinol NONE DETECTED  NONE DETECTED   Barbiturates NONE DETECTED  NONE DETECTED   Comment:            DRUG SCREEN FOR MEDICAL PURPOSES     ONLY.  IF CONFIRMATION IS NEEDED     FOR ANY PURPOSE, NOTIFY LAB     WITHIN 5 DAYS.                LOWEST DETECTABLE LIMITS     FOR URINE DRUG SCREEN     Drug Class       Cutoff (ng/mL)     Amphetamine      1000     Barbiturate      200     Benzodiazepine   854     Tricyclics       627     Opiates          300     Cocaine          300     THC              50  I-STAT CG4 LACTIC ACID, ED     Status: None   Collection Time    07/06/13  9:26 AM      Result Value Ref Range   Lactic Acid, Venous 1.67  0.5 - 2.2 mmol/L   Psychological Evaluations:  Assessment:  Polysubstance dependence including opioids ( with prior [facility] hospitalization in 2013 for ?overdose related to pain meds running out-pt denied attempt but admitted if pain could not be controlled she would kill herself) SIMD;Depresssion with suicidal ideation/plan DSM5:  Schizophrenia Disorders:  NA Obsessive-Compulsive Disorders:  NA Trauma-Stressor Disorders:  Acute Stress Disorder (308.3) and Adjustment Disorder with Mixed Anxiety/Depressed Mood (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 Migraine   . GERD (gastroesophageal reflux disease)     protonix evenings  . Endometriosis     chronic pain  . Anxiety     klonipin for stress disorder  . Degenerative disc disease    AXIS IV:  economic problems, occupational problems and problems with primary support group AXIS V:  41-50 serious  symptoms  Treatment Plan/Recommendations:  Admit to [facility] [address]   Treatment Plan Summary: Daily contact with patient to assess and evaluate symptoms and progress in treatment Current Medications:  Current Facility-Administered Medications  Medication Dose Route Frequency Provider Last Rate Last Dose  . acetaminophen (TYLENOL) tablet 1,000 mg  1,000 mg Oral Q6H PRN Shuvon Rankin, NP   1,000 mg at 07/07/13 1802  . acetaminophen (TYLENOL) tablet 650 mg  650 mg Oral Q4H PRN Shuvon Rankin, NP   650 mg at 07/08/13 0903  . chlordiazePOXIDE (LIBRIUM) capsule 25 mg  25 mg Oral Q6H PRN Shuvon Rankin, NP      .  chlordiazePOXIDE (LIBRIUM) capsule 25 mg  25 mg Oral Once Shuvon Rankin, NP      . chlordiazePOXIDE (LIBRIUM) capsule 25 mg  25 mg Oral QID Shuvon Rankin, NP   25 mg at 07/08/13 0859   Followed by  . chlordiazePOXIDE (LIBRIUM) capsule 25 mg  25 mg Oral TID Shuvon Rankin, NP       Followed by  . [START ON 07/09/2013] chlordiazePOXIDE (LIBRIUM) capsule 25 mg  25 mg Oral BH-qamhs Shuvon Rankin, NP       Followed by  . [START ON 07/11/2013] chlordiazePOXIDE (LIBRIUM) capsule 25 mg  25 mg Oral Daily Shuvon Rankin, NP      . cloNIDine (CATAPRES) tablet 0.1 mg  0.1 mg Oral QID Shuvon Rankin, NP   0.1 mg at 07/07/13 1754   Followed by  . [START ON 07/10/2013] cloNIDine (CATAPRES) tablet 0.1 mg  0.1 mg Oral BH-qamhs Shuvon Rankin, NP       Followed by  . [START ON 07/12/2013] cloNIDine (CATAPRES) tablet 0.1 mg  0.1 mg Oral QAC breakfast Shuvon Rankin, NP      . dicyclomine (BENTYL) tablet 20 mg  20 mg Oral Q6H PRN Shuvon Rankin, NP      . FLUoxetine (PROZAC) tablet 80 mg  80 mg Oral Daily Shuvon Rankin, NP   80 mg at 07/08/13 0859  . hydrOXYzine (ATARAX/VISTARIL) tablet 25 mg  25 mg Oral Q6H PRN Shuvon Rankin, NP   25 mg at 07/08/13 0053  . loperamide (IMODIUM) capsule 2-4 mg  2-4 mg Oral PRN Shuvon Rankin, NP      . magnesium hydroxide (MILK OF MAGNESIA) suspension 30 mL  30 mL Oral Daily PRN Shuvon  Rankin, NP      . methocarbamol (ROBAXIN) tablet 500 mg  500 mg Oral Q8H PRN Shuvon Rankin, NP   500 mg at 07/08/13 0903  . multivitamin with minerals tablet 1 tablet  1 tablet Oral Daily Shuvon Rankin, NP   1 tablet at 07/08/13 0900  . nicotine (NICODERM CQ - dosed in mg/24 hours) patch 21 mg  21 mg Transdermal Daily Shuvon Rankin, NP   21 mg at 07/08/13 0900  . pantoprazole (PROTONIX) EC tablet 40 mg  40 mg Oral Daily Shuvon Rankin, NP   40 mg at 07/08/13 0901  . polyethylene glycol (MIRALAX / GLYCOLAX) packet 17 g  17 g Oral BID PRN Shuvon Rankin, NP      . thiamine (B-1) injection 100 mg  100 mg Intramuscular Once Shuvon Rankin, NP      . thiamine (VITAMIN B-1) tablet 100 mg  100 mg Oral Daily Shuvon Rankin, NP   100 mg at 07/08/13 0901    Observation Level/Precautions:  15 minute checks  Laboratory:  See chart review  Psychotherapy: per [facility] protocol   Medications: See Medications/MAR   Consultations:  Hospitalist Consult pending  Discharge Concerns:  Chronic pain and drug addiction  Estimated LOS: 5-7 days  Other: NA    I certify that inpatient services furnished can reasonably be expected to improve the patient's condition.   Dara Hoyer 6/19/201510:57 AM   I have met with patient , discussed case with Mr. Harold Hedge, and reviewed chart. I agree with Mr. Malena Peer assessment, diagnosis. Patient is a 89 year old woman, who presented for severe depression, suicidal ideations, ruminations that she has become a burden to family. She reports severe chronic back pain from DDD, and states she has an upcoming orthopedic surgery in several weeks to address  this. She denies opiate abuse or misuse. She states she takes her opiates as prescribed and denies running out early or taking doses above recommended.  Drug screen is (+) for cocaine and patient states she is at a loss as to why this is positive and denies any cocaine use. Opiate doses- Oxycontin 80 mgrs TID and Oxymorphone for breakthrough  pain, were confirmed via her pharmacy. Patient is NOT interested in opiate detoxification- she states this is not the reason she is here, but rather for her depression.  We have modified plan as follows-  Will order Oxycontin 40 mgrs TID PRN pain- hold if sedated. Patient agrees to this dose taper. Will D/C prior Librium and CLonidine orders.' Neita Garnet, MD Psychiatrist 07/08/2013 4:12 PM

## 2013-07-08 NOTE — Progress Notes (Signed)
Didn't attend group stayed in bed 

## 2013-07-09 DIAGNOSIS — G8929 Other chronic pain: Secondary | ICD-10-CM

## 2013-07-09 DIAGNOSIS — F1910 Other psychoactive substance abuse, uncomplicated: Secondary | ICD-10-CM

## 2013-07-09 DIAGNOSIS — R45851 Suicidal ideations: Secondary | ICD-10-CM

## 2013-07-09 DIAGNOSIS — F329 Major depressive disorder, single episode, unspecified: Principal | ICD-10-CM

## 2013-07-09 MED ORDER — OXYCODONE HCL ER 40 MG PO T12A
40.0000 mg | EXTENDED_RELEASE_TABLET | Freq: Once | ORAL | Status: AC
Start: 2013-07-09 — End: 2013-07-09
  Administered 2013-07-09: 40 mg via ORAL
  Filled 2013-07-09: qty 1

## 2013-07-09 MED ORDER — RISPERIDONE 1 MG PO TBDP
1.0000 mg | ORAL_TABLET | Freq: Every day | ORAL | Status: DC
  Administered 2013-07-09 – 2013-07-12 (×4): 1 mg via ORAL
  Filled 2013-07-09 (×6): qty 1

## 2013-07-09 MED ORDER — OXYCODONE HCL ER 40 MG PO T12A
80.0000 mg | EXTENDED_RELEASE_TABLET | Freq: Two times a day (BID) | ORAL | Status: DC
  Administered 2013-07-09 – 2013-07-13 (×8): 80 mg via ORAL
  Filled 2013-07-09 (×9): qty 2
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
## 2013-07-09 NOTE — Progress Notes (Addendum)
D:  Passive SI- Contracts, AH-command.Pt denies HI/VH. Pt is pleasant and cooperative. Pt says she's been having increased issues with AH. Pt stated she usually turns the TV up at night to help her at home drown out the voices.   A: Pt was offered support and encouragement. Pt was given scheduled medications. Pt was encourage to attend groups. Q 15 minute checks were done for safety.   R:Pt attends groups and interacts well with peers and staff. Pt is taking medication. Pt receptive to treatment and safety maintained on unit.

## 2013-07-09 NOTE — Progress Notes (Signed)
D: Pt passive SI-contracts. +ve AH.   HI/VH. Pt is pleasant and cooperative. Pt says she feels a little better today as compared to yesterday.   A: Pt was offered support and encouragement. Pt was given scheduled medications. Pt was encourage to attend groups. Q 15 minute checks were done for safety.   R:Pt attends groups and interacts well with peers and staff. Pt is taking medication.Pt receptive to treatment and safety maintained on unit.

## 2013-07-09 NOTE — Progress Notes (Signed)
[facility] MD Progress Note  07/09/2013 10:02 AM [name] [name]  MRN:  [id number] Subjective:  Patient complained of feeling depressed, anxious, hearing voices telling her to end her life and thought about taking tylenol overdose. She is tearful when talking about how hard to manage her chronic pain during this visit. She agree to start risperidone and adjusting her pain medications. She has suicidal thoughts and contract for safety while in hospital. She was previously treated by Dr. Sabra Heck, Dr. Arelia Sneddon in [place].  Diagnosis:   DSM5: Schizophrenia Disorders:   Obsessive-Compulsive Disorders:   Trauma-Stressor Disorders:   Substance/Addictive Disorders:   Depressive Disorders:  Major Depressive Disorder - Severe (296.23) Total Time spent with patient: 45 minutes  Axis I: Major Depression, single episode and Substance Abuse  ADL's:  Impaired  Sleep: Poor  Appetite:  Fair  Suicidal Ideation:  Patient says she is better of dead, able to contract for safety while in hospital Homicidal Ideation:  denied AEB (as evidenced by):  Psychiatric Specialty Exam: Physical Exam  ROS  Blood pressure 84/58, pulse 64, temperature 98.3 F (36.8 C), temperature source Oral, resp. rate 17, height 5\' 2"  (1.575 m), weight 61.236 kg (135 lb), last menstrual period 08/10/2011, SpO2 99.00%.Body mass index is 24.69 kg/(m^2).  General Appearance: Guarded  Eye Contact::  Fair  Speech:  Clear and Coherent and Slow  Volume:  Decreased  Mood:  Anxious, Depressed, Dysphoric and Hopeless  Affect:  Depressed and Tearful  Thought Process:  Coherent and Goal Directed  Orientation:  Full (Time, Place, and Person)  Thought Content:  WDL  Suicidal Thoughts:  Yes.  without intent/plan  Homicidal Thoughts:  No  Memory:  Immediate;   Fair Recent;   Fair  Judgement:  Intact  Insight:  Lacking  Psychomotor Activity:  Psychomotor Retardation  Concentration:  Good  Recall:  Good  Fund of Knowledge:Good  Language:  Good  Akathisia:  NA  Handed:  Right  AIMS (if indicated):     Assets:  Communication Skills Desire for Improvement Financial Resources/Insurance Housing Intimacy Leisure Time [place] Talents/Skills Transportation  Sleep:  Number of Hours: 6   Musculoskeletal: Strength & Muscle Tone: within normal limits Gait & Station: normal Patient leans: N/A  Current Medications: Current Facility-Administered Medications  Medication Dose Route Frequency Provider Last Rate Last Dose  . acetaminophen (TYLENOL) tablet 650 mg  650 mg Oral Q4H PRN Shuvon Rankin, NP   650 mg at 07/08/13 0903  . FLUoxetine (PROZAC) tablet 80 mg  80 mg Oral Daily Shuvon Rankin, NP   80 mg at 07/09/13 0911  . hydrOXYzine (ATARAX/VISTARIL) tablet 25 mg  25 mg Oral Q6H PRN Shuvon Rankin, NP   25 mg at 07/08/13 2222  . magnesium hydroxide (MILK OF MAGNESIA) suspension 30 mL  30 mL Oral Daily PRN Shuvon Rankin, NP      . methocarbamol (ROBAXIN) tablet 500 mg  500 mg Oral Q8H PRN Shuvon Rankin, NP   500 mg at 07/08/13 0903  . multivitamin with minerals tablet 1 tablet  1 tablet Oral Daily Shuvon Rankin, NP   1 tablet at 07/09/13 0911  . nicotine (NICODERM CQ - dosed in mg/24 hours) patch 21 mg  21 mg Transdermal Daily Shuvon Rankin, NP   21 mg at 07/09/13 0912  . OxyCODONE (OXYCONTIN) 12 hr tablet 80 mg  80 mg Oral Q12H Durward Parcel, MD      . pantoprazole (PROTONIX) EC tablet 40 mg  40 mg  Oral Daily Shuvon Rankin, NP   40 mg at 07/09/13 0914  . polyethylene glycol (MIRALAX / GLYCOLAX) packet 17 g  17 g Oral BID PRN Shuvon Rankin, NP      . risperiDONE (RISPERDAL M-TABS) disintegrating tablet 1 mg  1 mg Oral QHS Durward Parcel, MD      . thiamine (VITAMIN B-1) tablet 100 mg  100 mg Oral Daily Shuvon Rankin, NP   100 mg at 07/09/13 0912  . traZODone (DESYREL) tablet 25 mg  25 mg Oral QHS PRN Lurena Nida, NP   25 mg at 07/08/13 2222    Lab Results: No results  found for this or any previous visit (from the past 48 hour(s)).  Physical Findings: AIMS: Facial and Oral Movements Muscles of Facial Expression: None, normal Lips and Perioral Area: None, normal Jaw: None, normal Tongue: None, normal,Extremity Movements Upper (arms, wrists, hands, fingers): None, normal Lower (legs, knees, ankles, toes): None, normal, Trunk Movements Neck, shoulders, hips: None, normal, Overall Severity Severity of abnormal movements (highest score from questions above): None, normal Incapacitation due to abnormal movements: None, normal Patient's awareness of abnormal movements (rate only patient's report): No Awareness, Dental Status Current problems with teeth and/or dentures?: No Does patient usually wear dentures?: No  CIWA:  CIWA-Ar Total: 2 COWS:  COWS Total Score: 1  Treatment Plan Summary: Daily contact with patient to assess and evaluate symptoms and progress in treatment Medication management  Plan: Increased oxycodone 80 mg PO BID for better control of pain as she was prescribed out side Start Risperidone 1 mg PO Qhs for hallucination (Abilify was not helpful) Treatment Plan/Recommendations:   1. Admit for crisis management and stabilization. 2. Medication management to reduce current symptoms to base line and improve the patient's overall level of functioning. 3. Treat health problems as indicated. 4. Develop treatment plan to decrease risk of relapse upon discharge and to reduce the need for readmission. 5. Psycho-social education regarding relapse prevention and self care. 6. Health care follow up as needed for medical problems. 7. Restart home medications where appropriate.   Medical Decision Making Problem Points:  Established problem, worsening (2), Review of last therapy session (1) and Review of psycho-social stressors (1) Data Points:  Review or order clinical lab tests (1) Review or order medicine tests (1) Review of medication regiment  & side effects (2) Review of new medications or change in dosage (2)  I certify that inpatient services furnished can reasonably be expected to improve the patient's condition.   JONNALAGADDA,JANARDHAHA R. 07/09/2013, 10:02 AM

## 2013-07-09 NOTE — B[facility] Group Notes (Signed)
[facility] Group Notes: (Clinical Social Work)   07/09/2013      Type of Therapy:  Group Therapy   Participation Level:  Did Not Attend    Selmer Dominion, LCSW 07/09/2013, 4:52 PM

## 2013-07-09 NOTE — Progress Notes (Signed)
D) Pt did not attend groups today stating, "I am in too much pain to go to groups. My pain medications were different at home and I just cannot sit or do anything. I just have to lie in my bed. Pt has been asking for her pain medications since arriving on the unit last night. Is focused mainly on the pain and the medication and getting it to what she was getting at home. Is staff splitting with the providers and nursing staff. Rates her depression at an 8 and her hopelessness at a 9. Has thoughts of SI on and off. Hears voices that are telling her "to end it all". States that all she has to look forward to is one surgery after another until the pain can stop. A) Provided with hot packs during the day to help with her discomfort. Allowed to come back from the cafeteria early so that she could lie down. Encouraged to rest when she needed but to also be able to come to the groups.  R) Pt is focused on her pain and her pain meds at present

## 2013-07-09 NOTE — Progress Notes (Signed)
Date: 07/09/2013  Time: 1015  Group Topic/Focus:  Identifying Needs: The focus of this group is to help patients identify their personal needs that have been historically problematic and identify healthy behaviors to address their needs.  Participation Level: Did not attend   Paulino Rily

## 2013-07-09 NOTE — Progress Notes (Signed)
Adult Psychoeducational Group Note  Date:  07/09/2013 Time:  900 pm  Group Topic/Focus:  Wrap-Up Group:   The focus of this group is to help patients review their daily goal of treatment and discuss progress on daily workbooks.  Participation Level:  Active  Participation Quality:  Appropriate  Affect:  Appropriate  Cognitive:  Appropriate  Insight: Appropriate  Engagement in Group:  Engaged  Modes of Intervention:  Discussion  Additional Comments:  Pt reported that since she arrived yesterday her day has been a little better than yesterday.  Pt reported that she often gets into verbal conflicts with her significant other about parenting, which often causes her to isolate herself, which causes increased anger and depression. Pt reported wanting to communicate in a healthier manner with her family.  Janice Coffin A 07/09/2013, 10:41 PM

## 2013-07-09 NOTE — Progress Notes (Signed)
.  Psychoeducational Group Note    Date: 07/09/2013 Time:  0930 Goal Setting Purpose of Group: To be able to set a goal that is measurable and that can be accomplished in one day Participation Level:  Did not attend  [name] [name]
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
## 2013-07-10 MED ORDER — GABAPENTIN 100 MG PO CAPS
100.0000 mg | ORAL_CAPSULE | Freq: Three times a day (TID) | ORAL | Status: DC
  Administered 2013-07-10 – 2013-07-13 (×9): 100 mg via ORAL
  Filled 2013-07-10 (×12): qty 1

## 2013-07-10 NOTE — [facility] Group Notes (Signed)
[place] Group Notes:  (Clinical Social Work)  07/10/2013   1:15-2:15PM  Summary of Progress/Problems:  The main focus of today's process group was to   identify the patient's current support system and decide on other supports that can be put in place.  The picture on workbook was used to discuss why additional supports are needed.  An emphasis was placed on using counselor, doctor, therapy groups, 12-step groups, and problem-specific support groups to expand supports.   There was also an extensive discussion about what constitutes a healthy support versus an unhealthy support.  The patient expressed full comprehension of the concepts presented, and agreed that there is a need to add more supports.  The patient stated one additional support she would be willing to add is to communicate better with her family about her pain and how it really feels.  She already sees a therapist, will be continuing that.  Type of Therapy:  Process Group  Participation Level:  Active  Participation Quality:  Attentive and Sharing  Affect:  Blunted  Cognitive:  Appropriate and Oriented  Insight:  Developing/Improving  Engagement in Therapy:  Engaged  Modes of Intervention:  Education,  Support and [employer], LCSW 07/10/2013, 4:00pm

## 2013-07-10 NOTE — Progress Notes (Signed)
D) Pt is attending the groups and interacting with her peers. Affect and mood are brighter today outwardly however, Pt rates her depression and hopelessness both at a 7 and admits to thoughts of SI on and off today. Pt states she is still having increased pain and discomfort and would like to have her medications adjusted so that she can get her pain medications 3 times a day. Pt went to lunch and walked down without problems. A) Given support, reassurance and appropriate praise. Encouraged to go to the groups and to partisipate. R) Pt contracts for safety on the unit. Attending the program.

## 2013-07-10 NOTE — Progress Notes (Signed)
Patient ID: [name] [name], female   DOB: Aug 01, 1980, 33 y.o.   MRN: [id number] [facility] MD Progress Note  07/10/2013 3:23 PM [name] [name]  MRN:  [id number]  Subjective:  Pt seen and chart reviewed. Pt denies SI, HI, and AVH, contracts for safety. Pt admits recent SI with plan to OD upon admission but reports that this has subsided. Pt reports chronic lower back pain and that she has DJD which has responded to Neurontin in the past; pt reports [facility] told her they do not use Neurontin due to cost, only Lyrica, which pt reports did not help at all. Started on Neurontin 100mg  tid. Pt reports poor sleep but good appetite. During this assessment, pt continues to ruminate about pain medication and must be redirected multiple times to the topics we are discussing. Pt also requested Remeron at 45mg  qhs which she reports Dr. Sabra Heck prescribed historically. Pt informed that we will consider the addition of Remeron but not starting at 45mg  and depending upon other medication interactions. Pt affirmed understanding.    Diagnosis:   DSM5: Schizophrenia Disorders:   Obsessive-Compulsive Disorders:   Trauma-Stressor Disorders:   Substance/Addictive Disorders:   Depressive Disorders:  Major Depressive Disorder - Severe (296.23) Total Time spent with patient: 25 minutes  Axis I: Major Depression, single episode and Substance Abuse  ADL's:  Intact  Sleep: Poor  Appetite:  Good  Suicidal Ideation:  Denies Homicidal Ideation:  denied AEB (as evidenced by):  Psychiatric Specialty Exam: Physical Exam  ROS  Blood pressure 84/43, pulse 75, temperature 98.4 F (36.9 C), temperature source Oral, resp. rate 18, height 5\' 2"  (1.575 m), weight 61.236 kg (135 lb), last menstrual period 08/10/2011, SpO2 99.00%.Body mass index is 24.69 kg/(m^2).  General Appearance: Guarded  Eye Contact::  Fair  Speech:  Clear and Coherent and Slow  Volume:  Decreased  Mood:  Anxious, Depressed, Dysphoric and Hopeless  Affect:   Depressed and Tearful  Thought Process:  Coherent and Goal Directed  Orientation:  Full (Time, Place, and Person)  Thought Content:  WDL  Suicidal Thoughts:  Yes.  without intent/plan  Homicidal Thoughts:  No  Memory:  Immediate;   Fair Recent;   Fair  Judgement:  Intact  Insight:  Lacking  Psychomotor Activity:  Psychomotor Retardation  Concentration:  Good  Recall:  Good  Fund of Knowledge:Good  Language: Good  Akathisia:  NA  Handed:  Right  AIMS (if indicated):     Assets:  Communication Skills Desire for Improvement Financial Resources/Insurance Housing Intimacy Leisure Time [place] Talents/Skills Transportation  Sleep:  Number of Hours: 5   Musculoskeletal: Strength & Muscle Tone: within normal limits Gait & Station: normal Patient leans: N/A  Current Medications: Current Facility-Administered Medications  Medication Dose Route Frequency Rilley Stash Last Rate Last Dose  . acetaminophen (TYLENOL) tablet 650 mg  650 mg Oral Q4H PRN Shuvon Rankin, NP   650 mg at 07/08/13 0903  . FLUoxetine (PROZAC) tablet 80 mg  80 mg Oral Daily Shuvon Rankin, NP   80 mg at 07/10/13 0845  . hydrOXYzine (ATARAX/VISTARIL) tablet 25 mg  25 mg Oral Q6H PRN Shuvon Rankin, NP   25 mg at 07/09/13 2347  . magnesium hydroxide (MILK OF MAGNESIA) suspension 30 mL  30 mL Oral Daily PRN Shuvon Rankin, NP      . methocarbamol (ROBAXIN) tablet 500 mg  500 mg Oral Q8H PRN Shuvon Rankin, NP   500 mg at 07/08/13 0903  . multivitamin with minerals  tablet 1 tablet  1 tablet Oral Daily Shuvon Rankin, NP   1 tablet at 07/10/13 0846  . nicotine (NICODERM CQ - dosed in mg/24 hours) patch 21 mg  21 mg Transdermal Daily Shuvon Rankin, NP   21 mg at 07/10/13 0846  . OxyCODONE (OXYCONTIN) 12 hr tablet 80 mg  80 mg Oral Q12H Durward Parcel, MD   80 mg at 07/10/13 0846  . pantoprazole (PROTONIX) EC tablet 40 mg  40 mg Oral Daily Shuvon Rankin, NP   40 mg at 07/10/13 0846   . polyethylene glycol (MIRALAX / GLYCOLAX) packet 17 g  17 g Oral BID PRN Shuvon Rankin, NP      . risperiDONE (RISPERDAL M-TABS) disintegrating tablet 1 mg  1 mg Oral QHS Durward Parcel, MD   1 mg at 07/09/13 2212  . thiamine (VITAMIN B-1) tablet 100 mg  100 mg Oral Daily Shuvon Rankin, NP   100 mg at 07/10/13 0846  . traZODone (DESYREL) tablet 25 mg  25 mg Oral QHS PRN Lurena Nida, NP   25 mg at 07/09/13 2347    Lab Results: No results found for this or any previous visit (from the past 48 hour(s)).  Physical Findings: AIMS: Facial and Oral Movements Muscles of Facial Expression: None, normal Lips and Perioral Area: None, normal Jaw: None, normal Tongue: None, normal,Extremity Movements Upper (arms, wrists, hands, fingers): None, normal Lower (legs, knees, ankles, toes): None, normal, Trunk Movements Neck, shoulders, hips: None, normal, Overall Severity Severity of abnormal movements (highest score from questions above): None, normal Incapacitation due to abnormal movements: None, normal Patient's awareness of abnormal movements (rate only patient's report): No Awareness, Dental Status Current problems with teeth and/or dentures?: No Does patient usually wear dentures?: No  CIWA:  CIWA-Ar Total: 2 COWS:  COWS Total Score: 1  Treatment Plan Summary: Daily contact with patient to assess and evaluate symptoms and progress in treatment Medication management  Plan: Continue  oxycodone 80 mg PO BID for better control of pain as she was prescribed out side Continue Risperidone 1 mg PO Qhs for hallucination (Abilify was not helpful) -Neurontin 100mg  tid for numbness/tingling in bilateral lower extremities  Treatment Plan/Recommendations:   1. Admit for crisis management and stabilization. 2. Medication management to reduce current symptoms to base line and improve the patient's overall level of functioning. 3. Treat health problems as indicated. 4. Develop treatment  plan to decrease risk of relapse upon discharge and to reduce the need for readmission. 5. Psycho-social education regarding relapse prevention and self care. 6. Health care follow up as needed for medical problems. 7. Restart home medications where appropriate.   Medical Decision Making Problem Points:  Established problem, stable/improving (1), Review of last therapy session (1) and Review of psycho-social stressors (1) Data Points:  Review or order clinical lab tests (1) Review or order medicine tests (1) Review of medication regiment & side effects (2) Review of new medications or change in dosage (2)  I certify that inpatient services furnished can reasonably be expected to improve the patient's condition.   Benjamine Mola, FNP-BC 07/10/2013, 3:23 PM  Reviewed the information documented and agree with the treatment plan.  JONNALAGADDA,JANARDHAHA R. 07/12/2013 10:03 AM

## 2013-07-10 NOTE — Progress Notes (Signed)
Psychoeducational Group Note  Date: 07/10/2013 Time: 1015  Group Topic/Focus:  Making Healthy Choices:   The focus of this group is to help patients identify negative/unhealthy choices they were using prior to admission and identify positive/healthier coping strategies to replace them upon discharge.  Participation Level:  Minimal  Participation Quality:  Attentive  Affect:  Depressed  Cognitive:  Appropriate  Insight:  Engaged  Engagement in Group:  Engaged  Additional Comments:    07/10/2013,10:42 AM Rob Hickman Trixie Rude

## 2013-07-11 DIAGNOSIS — F411 Generalized anxiety disorder: Secondary | ICD-10-CM

## 2013-07-11 NOTE — Progress Notes (Signed)
Adult Psychoeducational Group Note  Date:  07/11/2013 Time:  4:27 PM  Group Topic/Focus:  Dimensions of Wellness:   The focus of this group is to introduce the topic of wellness and discuss the role each dimension of wellness plays in total health.  Participation Level:  Active  Participation Quality:  Inattentive and Redirectable  Affect:  Depressed and Flat  Cognitive:  Alert and Oriented  Insight: Improving and Lacking  Engagement in Group:  Improving  Modes of Intervention:  Discussion  Additional Comments:  Pt talked while group was talking. Pt was redirectable. Pt identified several goals she would like to focus on: quit isolating and asking for and seeking support. Gunnar Bulla 07/11/2013, 4:27 PM

## 2013-07-11 NOTE — [facility] Group Notes (Signed)
[place] LCSW Group Therapy  07/11/2013   1:15 PM   Type of Therapy:  Group Therapy  Participation Level:  Active  Participation Quality:  Attentive, Sharing and Supportive  Affect:  Depressed and Flat  Cognitive:  Alert and Oriented  Insight:  Developing/Improving and Engaged  Engagement in Therapy:  Developing/Improving and Engaged  Modes of Intervention:  Clarification, Confrontation, Discussion, Education, Exploration, Limit-setting, Orientation, Problem-solving, Rapport Building, Art therapist, Socialization and Support  Summary of Progress/Problems: Pt identified obstacles faced currently and processed barriers involved in overcoming these obstacles. Pt identified steps necessary for overcoming these obstacles and explored motivation (internal and external) for facing these difficulties head on. Pt further identified one area of concern in their lives and chose a goal to focus on for today. Pt shared that her biggest obstacle is dealing with mental health court and pain, as she waits for spinal surgery.  Pt discussed feeling overwhelmed with having to balance all of the appointments and having pain as well.  Pt actively participated and was engaged in group discussion.    Regan Lemming, LCSW 07/11/2013  3:03 PM

## 2013-07-11 NOTE — Progress Notes (Signed)
Pt attended spiritual care group on grief and loss facilitated by chaplain Jerene Pitch. Group opened with brief discussion and psycho-social ed around grief and loss in relationships and in relation to self - identifying life patterns, circumstances, changes that cause losses. Established group norm of speaking from own life experience. Group goal of establishing open and affirming space for members to share loss and experience with grief, normalize grief experience and provide psycho social education and grief support.   Lakisa spoke with another member about the loss of their fathers.  Arine's father passed 7 years ago.  She had a tumultuous relationship with her father until the last 5 years of his life.  She moved to [place] to care for her father and she reports being able to repair some of her relationship with him.  She has not been able to talk with her mother, as her mother expected her to move home after Jalexis's father's death.  Genavive states that whenever she talks with her mother, they both cry, so she has not spoken with her in the last couple years.  Spoke with the group about how other's expectations or goals can influence our own grief.  Was supportive of other group members as the group reflected on the isolation of grief and the process of finding safe places to share their grief.

## 2013-07-11 NOTE — Progress Notes (Signed)
Adult Psychoeducational Group Note  Date:  07/11/2013 Time:  9:32 PM  Group Topic/Focus:  Wrap-Up Group:   The focus of this group is to help patients review their daily goal of treatment and discuss progress on daily workbooks.  Participation Level:  Active  Participation Quality:  Attentive  Affect:  Appropriate  Cognitive:  Appropriate  Insight: Appropriate  Engagement in Group:  Engaged  Modes of Intervention:  Clarification, Discussion and Exploration  Additional Comments:  Pt participated in wrap-up group with [facility]. Pt stated that she is feeling better and had a good talk with the doctor today.  Moshe Cipro 07/11/2013, 9:32 PM

## 2013-07-11 NOTE — Progress Notes (Signed)
Patient ID: [name] [name], female   DOB: 03/10/1980, 33 y.o.   MRN: [id number] [name] MD Progress Note  07/11/2013 4:35 PM Omunique Pederson  MRN:  [id number] Subjective: Acknowledges some improvement- continues to report chronic back pain. Focused on medication issues. Objective:  Partially improved mood compared to admission. Still labile and intermittently tearful. Focused on pain / narcotic analgesics and very worried that her outpatient pain clinic prescriber may not continue them or renew them. Attributes this fear/concern to the severity of her pain. States she is feeling better insofar as her mood and she does present better groomed, better related, more conversant, although still labile.Denies any SI. There have been no WDL symptoms on current opiate dosing / off BZDs, which she states she had not been taking for 2  Or so weeks prior to admission.  No disruptive behaviors on unit.  Patient states she is hopeful upcoming elective spinal surgery will help her chronic pain and make it easier for her to decrease opiate doses. Also, she states that Neurontin trial is helping . She denies any excessive sedation from her current medication regimen.   Diagnosis:   MDD, Anxiety Disorder NOS, consider related to chronic pain or substance induced.     Depressive Disorders:  Major Depressive Disorder - Severe (296.23) Total Time spent with patient: 20 minutes    ADL's: improved grooming   Sleep: good   Appetite:  Good  Suicidal Ideation:  Denies Homicidal Ideation:  denies AEB (as evidenced by):  Psychiatric Specialty Exam: Physical Exam  Review of Systems  Constitutional: Negative for fever and chills.  Musculoskeletal: Negative.        Chronic back pain  Psychiatric/Behavioral: Positive for depression and substance abuse. Negative for hallucinations. The patient is nervous/anxious.     Blood pressure 116/70, pulse 87, temperature 97.8 F (36.6 C), temperature source Oral, resp. rate 16,  height 5\' 2"  (1.575 m), weight 61.236 kg (135 lb), last menstrual period 08/10/2011, SpO2 99.00%.Body mass index is 24.69 kg/(m^2).  General Appearance: Better groomed   Eye Contact::  Improved   Speech:   Normal   Volume:  Normal   Mood:  Depressed , axnious   Affect:   Less labile  Thought Process:  Coherent and Goal Directed  Orientation:  Full (Time, Place, and Person)  Thought Content:  WDL  Suicidal Thoughts:  At this time denies any SI and contracts for safety on the unit   Homicidal Thoughts:  No  Memory:  Immediate;   Fair Recent;   Fair  Judgement:  Intact  Insight:  Lacking  Psychomotor Activity:  Normal  Concentration:  Good  Recall:  Good  Fund of Knowledge:Good  Language: Good  Akathisia:  NA  Handed:  Right  AIMS (if indicated):     Assets:  Communication Skills Desire for Improvement Financial Resources/Insurance Housing Intimacy Leisure Time [place] Talents/Skills Transportation  Sleep:  Number of Hours: 6.75   Musculoskeletal: Strength & Muscle Tone: within normal limits Gait & Station: normal Patient leans: N/A  Current Medications: Current Facility-Administered Medications  Medication Dose Route Frequency Provider Last Rate Last Dose  . acetaminophen (TYLENOL) tablet 650 mg  650 mg Oral Q4H PRN Shuvon Rankin, NP   650 mg at 07/08/13 0903  . FLUoxetine (PROZAC) tablet 80 mg  80 mg Oral Daily Shuvon Rankin, NP   80 mg at 07/11/13 0841  . gabapentin (NEURONTIN) capsule 100 mg  100 mg Oral TID Benjamine Mola, FNP  100 mg at 07/11/13 1201  . hydrOXYzine (ATARAX/VISTARIL) tablet 25 mg  25 mg Oral Q6H PRN Shuvon Rankin, NP   25 mg at 07/10/13 2223  . magnesium hydroxide (MILK OF MAGNESIA) suspension 30 mL  30 mL Oral Daily PRN Shuvon Rankin, NP      . methocarbamol (ROBAXIN) tablet 500 mg  500 mg Oral Q8H PRN Shuvon Rankin, NP   500 mg at 07/08/13 0903  . multivitamin with minerals tablet 1 tablet  1 tablet Oral Daily  Shuvon Rankin, NP   1 tablet at 07/11/13 0842  . nicotine (NICODERM CQ - dosed in mg/24 hours) patch 21 mg  21 mg Transdermal Daily Shuvon Rankin, NP   21 mg at 07/11/13 0843  . OxyCODONE (OXYCONTIN) 12 hr tablet 80 mg  80 mg Oral Q12H Durward Parcel, MD   80 mg at 07/11/13 0846  . pantoprazole (PROTONIX) EC tablet 40 mg  40 mg Oral Daily Shuvon Rankin, NP   40 mg at 07/11/13 0843  . polyethylene glycol (MIRALAX / GLYCOLAX) packet 17 g  17 g Oral BID PRN Shuvon Rankin, NP      . risperiDONE (RISPERDAL M-TABS) disintegrating tablet 1 mg  1 mg Oral QHS Durward Parcel, MD   1 mg at 07/10/13 2139  . thiamine (VITAMIN B-1) tablet 100 mg  100 mg Oral Daily Shuvon Rankin, NP   100 mg at 07/11/13 0845  . traZODone (DESYREL) tablet 25 mg  25 mg Oral QHS PRN Lurena Nida, NP   25 mg at 07/10/13 2223    Lab Results: No results found for this or any previous visit (from the past 48 hour(s)).  Physical Findings: AIMS: Facial and Oral Movements Muscles of Facial Expression: None, normal Lips and Perioral Area: None, normal Jaw: None, normal Tongue: None, normal,Extremity Movements Upper (arms, wrists, hands, fingers): None, normal Lower (legs, knees, ankles, toes): None, normal, Trunk Movements Neck, shoulders, hips: None, normal, Overall Severity Severity of abnormal movements (highest score from questions above): None, normal Incapacitation due to abnormal movements: None, normal Patient's awareness of abnormal movements (rate only patient's report): No Awareness, Dental Status Current problems with teeth and/or dentures?: No Does patient usually wear dentures?: No  CIWA:  CIWA-Ar Total: 1 COWS:  COWS Total Score: 2  Assessment: Patient is slowly improving but remains depressed, labile, and focused on pain/opiate medication issues. She is participating more in groups and denies SI currently.   Treatment Plan Summary: Daily contact with patient to assess and evaluate  symptoms and progress in treatment Medication management  Plan: Continue  Oxycodone 80 mg PO BID for better control of pain as she was prescribed out side Continue Risperidone 1 mg PO Qhs for hallucination (Abilify was not helpful), Continue Prozac at 80 mgrs QDAY  Now on Neurontin 100mg  TID  for numbness/tingling in bilateral lower extremities  Treatment Plan/Recommendations:  Continue milieu and supportive therapy.    Medical Decision Making Problem Points:  Established problem, stable/improving (1) and Review of psycho-social stressors (1) Data Points:  Review of medication regiment & side effects (2) Review of new medications or change in dosage (2)  I certify that inpatient services furnished can reasonably be expected to improve the patient's condition.   Neita Garnet, FNP-BC 07/11/2013, 4:35 PM

## 2013-07-11 NOTE — Progress Notes (Signed)
D: Pt denies SI/HI/AVH. Pt is pleasant and cooperative. Pt appears very needy and tries to be manipulative. Pt very attention seeking.   A: Pt was offered support and encouragement. Pt was given scheduled medications. Pt was encourage to attend groups. Q 15 minute checks were done for safety.   R:Pt attends groups and interacts well with peers and staff. Pt is taking medication.Pt receptive to treatment and safety maintained on unit.

## 2013-07-11 NOTE — Clinical Social Work Note (Signed)
CSW cancelled pt's spinal appointment that was scheduled this morning and informed pt, asking her to call to reschedule it.  CSW spoke with Will at [facility] [phone]) to inform him that pt was here, confirm her follow up appointment and location/fax number.  Will states that pt does have an active order for arrest out right now but that pt shouldn't worry as he will take care of it.  CSW informed pt of all of this at this time.  Pt plans to follow up with her PCP and plans to get that appt to CSW tomorrow, when she calls with Dr. Parke Poisson.  No further needs voiced by pt at this time.    Regan Lemming, LCSW 07/11/2013  3:07 PM

## 2013-07-11 NOTE — BHH Group Notes (Signed)
[facility] LCSW Aftercare Discharge Planning Group Note   07/11/2013  8:45 AM  Participation Quality:  Did Not Attend - pt sleeping in her room  Regan Lemming, LCSW 07/11/2013 10:57 AM

## 2013-07-11 NOTE — Progress Notes (Signed)
D:  Patient's self inventory sheet, patient has fair sleep, improving appetite, low energy level, improving attention span.  Rated depression and hopeless 5, anxiety 7.  Denied withdrawals.  Denied SI.  Has experienced pain in past 24 hours, worst pain #8, pain goal #4.  Plans to take her medications after discharge.  Plans to return home after discharge.  No problems with medications after discharge. A:  Medications administered per MD orders.  Emotional support and encouragement given patient. R:  Denied SI and HI.  Denied A/V hallucinations.  Will continue to monitor patient for safety with 15 minute checks.  Safety maintained. Patient stated she would like to keep her back pain down to #4.  Went back to bed after morning medications, would not go to first group.

## 2013-07-11 NOTE — BHH Suicide Risk Assessment (Signed)
[facility] Adult Inpatient Family/Significant Other Suicide Prevention Education  Suicide Prevention Education:   Patient Refusal for Family/Significant Other Suicide Prevention Education: The patient has refused to provide written consent for family/significant other to be provided Family/Significant Other Suicide Prevention Education during admission and/or prior to discharge.  Physician notified.  CSW provided suicide prevention information with patient.    The suicide prevention education provided includes the following:  Suicide risk factors  Suicide prevention and interventions  National Suicide Hotline telephone number  [facility] assessment telephone number  [facility] Emergency Assistance [place] and/or Residential Mobile Crisis Unit telephone number   Regan Lemming, [place] 07/11/2013 2:31 PM

## 2013-07-12 MED ORDER — METHOCARBAMOL 500 MG PO TABS
500.0000 mg | ORAL_TABLET | Freq: Three times a day (TID) | ORAL | Status: DC | PRN
  Administered 2013-07-12 – 2013-07-13 (×2): 500 mg via ORAL
  Filled 2013-07-12: qty 1

## 2013-07-12 NOTE — Progress Notes (Signed)
Recreation Therapy Notes  Animal-Assisted Activity/Therapy (AAA/T) Program Checklist/Progress Notes Patient Eligibility Criteria Checklist & Daily Group note for Rec Tx Intervention  Date: 06.23.2015 Time: 3:15pm Location: [address]    AAA/T Program Assumption of Risk Form signed by Patient/ or Parent Legal Guardian yes  Patient is free of allergies or sever asthma yes  Patient reports no fear of animals yes  Patient reports no history of cruelty to animals yes   Patient understands his/her participation is voluntary yes  Patient washes hands before animal contact yes  Patient washes hands after animal contact yes  Behavioral Response: Appropriate   Education: Hand Washing, Appropriate Animal Interaction   Education Outcome: Acknowledges understanding  Clinical Observations/Feedback: Patient interacted appropriately with therapy dog, handler and MHT.   Laureen Ochs Blanchfield, LRT/CTRS        Blanchfield, Denise L 07/12/2013 4:55 PM

## 2013-07-12 NOTE — Progress Notes (Signed)
Adult Psychoeducational Group Note  Date:  07/12/2013 Time:  8:00PM Group Topic/Focus:  Wrap-Up Group:   The focus of this group is to help patients review their daily goal of treatment and discuss progress on daily workbooks.  Participation Level:  Active  Participation Quality:  Appropriate and Attentive  Affect:  Appropriate  Cognitive:  Alert and Appropriate  Insight: Appropriate  Engagement in Group:  Engaged  Modes of Intervention:  Discussion  Additional Comments:  Pt. Was attentive and appropriate during tonight's group discussion. Pt stated that today was an ok day. Pt shared that she is ready for discharge tomorrow and she is going to reduce isolation and commuicate daily her feelings and behavior.   Theodoro Grist D 07/12/2013, 9:40 PM

## 2013-07-12 NOTE — BHH Group Notes (Signed)
[place] LCSW Group Therapy      Feelings About Diagnosis 1:15 - 2:30 PM         07/12/2013    Type of Therapy:  Group Therapy  Participation Level:  Active  Participation Quality:  Appropriate  Affect:  Appropriate  Cognitive:  Alert and Appropriate  Insight:  Developing/Improving and Engaged  Engagement in Therapy:  Developing/Improving and Engaged  Modes of Intervention:  Discussion, Education, Exploration, Problem-Solving, Rapport Building, Support  Summary of Progress/Problems:  Patient actively participated in group. Patient discussed past and present diagnosis and the effects it has had on  life.  Patient talked about family and society being judgmental and the stigma associated with having a mental health diagnosis.  Patient shared she has had problems with having a mental health diagnosis in the past but accepts it at this time.  Concha Pyo 07/12/2013

## 2013-07-12 NOTE — Tx Team (Signed)
Interdisciplinary Treatment Plan Update   Date Reviewed:  07/12/2013  Time Reviewed:  8:36 AM  Progress in Treatment:   Attending groups: Yes Participating in groups: Yes Taking medication as prescribed: Yes  Tolerating medication: Yes Family/Significant other contact made:  No, patient declinedcollateral contact Patient understands diagnosis: Yes  Discussing patient identified problems/goals with staff: Yes Medical problems stabilized or resolved: Yes Denies suicidal/homicidal ideation: Yes Patient has not harmed self or others: Yes  For review of initial/current patient goals, please see plan of care.  Estimated Length of Stay:  0ne day  Reasons for Continued Hospitalization:  Anxiety Depression Medication stabilization   New Problems/Goals identified:    Discharge Plan or Barriers:   Home with outpatient follow up with outpatient provider.  Additional Comments:  Attendees:  Patient:  07/12/2013 8:36 AM   Signature:  Gabriel Earing, MD 07/12/2013 8:36 AM  Signature:  07/12/2013 8:36 AM  Signature: Desma Paganini, RN 07/12/2013 8:36 AM  Signature:Beverly Danelle Earthly, RN 07/12/2013 8:36 AM  Signature:  Clinton Sawyer, RN 07/12/2013 8:36 AM  Signature:  Joette Catching, LCSW 07/12/2013 8:36 AM  Signature:  Regan Lemming, LCSW 07/12/2013 8:36 AM  Signature: Myles Gip, Care Coordinator [facility] 07/12/2013 8:36 AM  Signature:   07/12/2013 8:36 AM  Signature:  07/12/2013  8:36 AM  Signature:   Lars Pinks, RN [facility] 07/12/2013  8:36 AM  Signature: 07/12/2013  8:36 AM    Scribe for Treatment Team:   Joette Catching,  07/12/2013 8:36 AM

## 2013-07-12 NOTE — Progress Notes (Signed)
D:  Patient's self inventory sheet, patient sleeps well, improving appetite, low energy level, improving attention span.  Rated depression and hopeless 5, anxiety 8.  Denied withdrawals.  Denied SI.  Back pain continues.  Worst pain #8, pain goal #4 today.  Does have discharge plans.  No problems taking medications after discharge. A:  Medications administered per MD orders. Emotional support and encouragement given patient. R:  Denied SI and HI.  Denied A/V hallucinations.  Will continue to monitor patient for safety with 15 minute checks.  Safety maintained. 1

## 2013-07-12 NOTE — Progress Notes (Signed)
Patient ID: [name] [name], female   DOB: September 30, 1980, 33 y.o.   MRN: [id number] [facility] MD Progress Note  07/12/2013 1:25 PM Aprille Sawhney  MRN:  [id number] Subjective: " A little bit better".  Describes chronic back pain.  Objective:   I have discussed case with treatment team and met with patient. Today presents improved compared to prior presentation. Mood improved, affect less dysphoric, and less focused on medication issues. She stated that her pain was relatively well controlled on current medications/ doses , and seemed less focused on this today. She was more positive, stating she would like to be discharged soon because she misses her family. At her request and with her express written consent I contacted Dr. Arelia Sneddon, her PCP at [facility], who manages her pain medications. I reviewed  Only current medications and current opiate doses, and patient's progress on unit and  intention of continuing to follow with him after discharge for ongoing treatment. Dr. Arelia Sneddon aware of above.  Going to groups, interactive and pleasant with other patients and staff.  No disruptive behaviors on unit.  Denies medication side effects.    Diagnosis:   MDD, Anxiety Disorder NOS, consider related to chronic pain or substance induced.     Depressive Disorders:  Major Depressive Disorder - Severe (296.23) Total Time spent with patient: 20 minutes    ADL's: improved grooming   Sleep: good   Appetite:  Good  Suicidal Ideation:  Denies Homicidal Ideation:  denies AEB (as evidenced by):  Psychiatric Specialty Exam: Physical Exam  Review of Systems  Constitutional: Negative for fever and chills.  Musculoskeletal: Negative.        Chronic back pain  Psychiatric/Behavioral: Positive for depression and substance abuse. Negative for hallucinations.    Blood pressure 100/69, pulse 87, temperature 98.7 F (37.1 C), temperature source Oral, resp. rate 16, height $RemoveBe'5\' 2"'[id number]$  (1.575 m), weight 61.236 kg  (135 lb), last menstrual period 08/10/2011, SpO2 99.00%.Body mass index is 24.69 kg/(m^2).  General Appearance: Better groomed   Eye Contact::  Improved   Speech:   Normal   Volume:  Normal   Mood:  Improving , less anxious, less dysphoric  Affect:   Less labile, fuller in range   Thought Process:  Coherent and Goal Directed  Orientation:  Full (Time, Place, and Person)  Thought Content:  No hallucinations, no delusions  Suicidal Thoughts:  At this time denies any SI and contracts for safety on the unit   Homicidal Thoughts:  No  Memory:  Immediate;   Fair Recent;   Fair  Judgement:  Intact  Insight:  Lacking  Psychomotor Activity:  Normal  Concentration:  Good  Recall:  Good  Fund of Knowledge:Good  Language: Good  Akathisia:  NA  Handed:  Right  AIMS (if indicated):     Assets:  Communication Skills Desire for Improvement Financial Resources/Insurance Housing Intimacy Leisure Time [place] Talents/Skills Transportation  Sleep:  Number of Hours: 6.75   Musculoskeletal: Strength & Muscle Tone: within normal limits Gait & Station: normal Patient leans: N/A  Current Medications: Current Facility-Administered Medications  Medication Dose Route Frequency Provider Last Rate Last Dose  . acetaminophen (TYLENOL) tablet 650 mg  650 mg Oral Q4H PRN Shuvon Rankin, NP   650 mg at 07/08/13 0903  . FLUoxetine (PROZAC) tablet 80 mg  80 mg Oral Daily Shuvon Rankin, NP   80 mg at 07/12/13 1694  . gabapentin (NEURONTIN) capsule 100 mg  100 mg Oral  TID Benjamine Mola, FNP   100 mg at 07/12/13 1141  . hydrOXYzine (ATARAX/VISTARIL) tablet 25 mg  25 mg Oral Q6H PRN Shuvon Rankin, NP   25 mg at 07/10/13 2223  . magnesium hydroxide (MILK OF MAGNESIA) suspension 30 mL  30 mL Oral Daily PRN Shuvon Rankin, NP      . methocarbamol (ROBAXIN) tablet 500 mg  500 mg Oral Q8H PRN Shuvon Rankin, NP   500 mg at 07/11/13 2204  . multivitamin with minerals tablet 1  tablet  1 tablet Oral Daily Shuvon Rankin, NP   1 tablet at 07/12/13 0813  . nicotine (NICODERM CQ - dosed in mg/24 hours) patch 21 mg  21 mg Transdermal Daily Shuvon Rankin, NP   21 mg at 07/12/13 0814  . OxyCODONE (OXYCONTIN) 12 hr tablet 80 mg  80 mg Oral Q12H Durward Parcel, MD   80 mg at 07/12/13 0817  . pantoprazole (PROTONIX) EC tablet 40 mg  40 mg Oral Daily Shuvon Rankin, NP   40 mg at 07/12/13 0813  . polyethylene glycol (MIRALAX / GLYCOLAX) packet 17 g  17 g Oral BID PRN Shuvon Rankin, NP      . risperiDONE (RISPERDAL M-TABS) disintegrating tablet 1 mg  1 mg Oral QHS Durward Parcel, MD   1 mg at 07/11/13 2203  . thiamine (VITAMIN B-1) tablet 100 mg  100 mg Oral Daily Shuvon Rankin, NP   100 mg at 07/12/13 0814  . traZODone (DESYREL) tablet 25 mg  25 mg Oral QHS PRN Lurena Nida, NP   25 mg at 07/11/13 2205    Lab Results: No results found for this or any previous visit (from the past 48 hour(s)).  Physical Findings: AIMS: Facial and Oral Movements Muscles of Facial Expression: None, normal Lips and Perioral Area: None, normal Jaw: None, normal Tongue: None, normal,Extremity Movements Upper (arms, wrists, hands, fingers): None, normal Lower (legs, knees, ankles, toes): None, normal, Trunk Movements Neck, shoulders, hips: None, normal, Overall Severity Severity of abnormal movements (highest score from questions above): None, normal Incapacitation due to abnormal movements: None, normal Patient's awareness of abnormal movements (rate only patient's report): No Awareness, Dental Status Current problems with teeth and/or dentures?: No Does patient usually wear dentures?: No  CIWA:  CIWA-Ar Total: 1 COWS:  COWS Total Score: 2  Assessment:  Patient is improving- she is less depressed, less constricted in affect , and noticeably less intensely focused on opiate medications. Currently does not appear to be in any acute distress /pain. Attending groups and no  disruptive behaviors on unit. Tolerating medications well, and has tolerated Neurontin well thus far.  Plans to follow up with Dr. Arelia Sneddon, PCP, for ongoing medical management, pain managemenet after discharge.  Treatment Plan Summary: Daily contact with patient to assess and evaluate symptoms and progress in treatment Medication management  Plan: Continue  Oxycodone 80 mg PO BID  Continue Risperidone 1 mg PO QHS Continue Prozac at 80 mgrs QDAY  Continue  Neurontin $RemoveBe'100mg'[id number]$  TID   Consider discharge soon as she continues to stabilize  Treatment Plan/Recommendations:  Continue milieu and supportive therapy.    Medical Decision Making Problem Points:  Established problem, stable/improving (1) and Review of psycho-social stressors (1) Data Points:  Review of medication regiment & side effects (2)  I certify that inpatient services furnished can reasonably be expected to improve the patient's condition.   COBOS, Felicita Gage, FNP-BC 07/12/2013, 1:25 PM

## 2013-07-12 NOTE — Progress Notes (Signed)
The focus of this group is to educate the patient on the purpose and policies of crisis stabilization and provide a format to answer questions about their admission.  The group details unit policies and expectations of patients while admitted.  Patient attended 0900 nurse education orientation group this morning.  Patient actively participated, appropriate affect, alert, appropriate insight and engagement.  Today patient will work on 3 goals for discharge.  

## 2013-07-12 NOTE — BHH Suicide Risk Assessment (Signed)
[place] INPATIENT:  Family/Significant Other Suicide Prevention Education  Suicide Prevention Education:  Patient Refusal for Family/Significant Other Suicide Prevention Education: The patient [name] [name] has refused to provide written consent for family/significant other to be provided Family/Significant Other Suicide Prevention Education during admission and/or prior to discharge.  Physician notified.  Concha Pyo 07/12/2013, 3:30 PM

## 2013-07-13 MED ORDER — ACETAMINOPHEN 500 MG PO TABS: 500.0000 mg | ORAL_TABLET | Freq: Four times a day (QID) | ORAL | Status: DC | PRN

## 2013-07-13 MED ORDER — TRAZODONE 25 MG HALF TABLET: 25.0000 mg | ORAL_TABLET | Freq: Every evening | ORAL | Status: DC | PRN

## 2013-07-13 MED ORDER — RISPERIDONE 1 MG PO TBDP: 1.0000 mg | ORAL_TABLET | Freq: Every day | ORAL | Status: DC

## 2013-07-13 MED ORDER — OXYCODONE HCL ER 80 MG PO T12A: 80.0000 mg | EXTENDED_RELEASE_TABLET | Freq: Two times a day (BID) | ORAL | Status: DC

## 2013-07-13 MED ORDER — METHOCARBAMOL 500 MG PO TABS: 500.0000 mg | ORAL_TABLET | Freq: Three times a day (TID) | ORAL | Status: DC | PRN

## 2013-07-13 MED ORDER — PANTOPRAZOLE SODIUM 40 MG PO TBEC: 40.0000 mg | DELAYED_RELEASE_TABLET | Freq: Every day | ORAL | Status: DC

## 2013-07-13 MED ORDER — FLUOXETINE HCL 40 MG PO CAPS: 80.0000 mg | ORAL_CAPSULE | Freq: Every day | ORAL | Status: DC

## 2013-07-13 MED ORDER — HYDROXYZINE HCL 25 MG PO TABS
25.0000 mg | ORAL_TABLET | Freq: Four times a day (QID) | ORAL | Status: DC | PRN
  Administered 2013-07-12: 25 mg via ORAL

## 2013-07-13 MED ORDER — GABAPENTIN 100 MG PO CAPS: 100.0000 mg | ORAL_CAPSULE | Freq: Three times a day (TID) | ORAL | Status: DC

## 2013-07-13 MED ORDER — HYDROXYZINE HCL 25 MG PO TABS: 25.0000 mg | ORAL_TABLET | Freq: Four times a day (QID) | ORAL | Status: DC | PRN

## 2013-07-13 NOTE — Progress Notes (Signed)
Discharge Note: Discharge instructions and prescriptions given to patient. Patient verbalized understanding of discharge instructions and prescriptions. Returned belongings to patient. Denies SI/HI/AVH. Patient d/c without incident to the lobby and transported by husband.

## 2013-07-13 NOTE — BHH Group Notes (Signed)
[facility] LCSW Aftercare Discharge Planning Group Note   07/13/2013 1:07 PM    Participation Quality:  [place]  Mood/Affect:  Appropriate  Depression Rating:  4  Anxiety Rating:  6 (advised baseline is five)  Thoughts of Suicide:  No  Will you contract for safety?   NA  Current AVH:  No  Plan for Discharge/Comments:  Patient attended discharge planning group and actively participated in group.  She will follow up with her PCP for outpatient services.CSW provided all participants with daily workbook.   Transportation Means: Patient has transportation.   Supports:  Patient has a support system.   Hodnett, Eulas Post

## 2013-07-13 NOTE — [facility] Suicide Risk Assessment (Signed)
Demographic Factors:  33 year old female, lives with husband and children. Currently unemployed.  Total Time spent with patient: 30 minutes  Psychiatric Specialty Exam: Physical Exam  ROS  Blood pressure 101/69, pulse 59, temperature 97.8 F (36.6 C), temperature source Oral, resp. rate 20, height 5\' 2"  (1.575 m), weight 61.236 kg (135 lb), last menstrual period 08/10/2011, SpO2 99.00%.Body mass index is 24.69 kg/(m^2).  General Appearance: Well Groomed  Engineer, water::  Good  Speech:  Normal Rate  Volume:  Normal  Mood:  Euthymic- denies depression  Affect:  Appropriate and Full Range  Thought Process:  Linear  Orientation:  Full (Time, Place, and Person)  Thought Content:  no hallucinations, no delusions, less ruminative about pain and  narcotic pain management.  Suicidal Thoughts:  No- denies any suicidal ideations  Homicidal Thoughts:  No- denies any homicidal ideations  Memory:  NA  Judgement:  Good  Insight:  Fair  Psychomotor Activity:  Normal  Concentration:  Good  Recall:  Good  Fund of Knowledge:Good  Language: Good  Akathisia:  No  Handed:  Right  AIMS (if indicated):     Assets:  Communication Skills Desire for Improvement Housing Social Support  Sleep:  Number of Hours: 5.75    Musculoskeletal: Strength & Muscle Tone: within normal limits Gait & Station: normal Patient leans: N/A   Mental Status Per Nursing Assessment::   On Admission:  Suicidal ideation indicated by patient  Current Mental Status by Physician: at this time not suicidal , not homicidal , not psychotic. Mood and affect much improved,, better related, calmer than upon admission.   Loss Factors: Decline in physical health and  chronic back pain.  Historical Factors: Impulsivity and dealing with chronic back pain  Risk Reduction Factors:   Responsible for children under 21 years of age, Sense of responsibility to family, Living with another person, especially a relative and  Positive social support  Continued Clinical Symptoms:  Depression:   Impulsivity Chronic Pain  Cognitive Features That Contribute To Risk:  Closed-mindedness    Suicide Risk:  Mild:  Suicidal ideation of limited frequency, intensity, duration, and specificity.  There are no identifiable plans, no associated intent, mild dysphoria and related symptoms, good self-control (both objective and subjective assessment), few other risk factors, and identifiable protective factors, including available and accessible social support.  Discharge Diagnoses:   AXIS I:  Depressive Disorder NOS- consider depression secondary to medical condition/ chronic pain AXIS II:  Deferred AXIS III:   Past Medical History  Diagnosis Date  . Migraine   . GERD (gastroesophageal reflux disease)     protonix evenings  . Endometriosis     chronic pain  . Anxiety     klonipin for stress disorder  . Degenerative disc disease    AXIS IV:  chronic pain issues. Reports she has a stable and supportive family AXIS V:  61-70 mild symptoms-- 65 at the current time  Plan Of Care/Follow-up recommendations:  Activity:  As tolerated  Diet:  REgular Tests:  NA Other:  See below  Is patient on multiple antipsychotic therapies at discharge:  No   Has Patient had three or more failed trials of antipsychotic monotherapy by history:  No  Recommended Plan for Multiple Antipsychotic Therapies: NA  Patient plans to follow up with Dr. Arelia Sneddon at [facility]. She has an appointment to see him this week. Patient states she needs 2-3 days of opiate analgesic prescribed as bridge until her next  prescription from PCP.  She plans to follow up at  [facility] [place] for Outpatient Psychotherapy.  She states she may have an upcoming elective lumbar spine surgery in 2-3 months, Follows up at [facility].   COBOS, FERNANDO 07/13/2013, 9:42 AM

## 2013-07-15 NOTE — Progress Notes (Signed)
Patient Discharge Instructions:  After Visit Summary (AVS):   Faxed to:  07/15/13 Psychiatric Admission Assessment Note:   Faxed to:  07/15/13 Suicide Risk Assessment - Discharge Assessment:   Faxed to:  07/15/13 Faxed/Sent to the Next Level Care provider:  07/15/13 Faxed to [place] @ [phone] Faxed to [facility], 07/15/2013, 2:10 PM

## 2013-07-23 NOTE — Discharge Summary (Signed)
Physician Discharge Summary Note  Patient:  [name] [name] is an 33 y.o., female MRN:  [id number] DOB:  02-18-1980 Patient phone:  [phone] (home)  Patient address:   [address] [place] [facility] [address],  Total Time spent with patient: 30 minutes  Date of Admission:  07/07/2013 Date of Discharge: 07/13/2013  Reason for Admission:  MDD with SI  Discharge Diagnoses: Active Problems:   MDD (major depressive disorder)   Polysubstance (including opioids) dependence with physiological dependence   Psychiatric Specialty Exam: Physical Exam  Review of Systems  Constitutional: Negative.   HENT: Negative.   Eyes: Negative.   Respiratory: Negative.   Cardiovascular: Negative.   Gastrointestinal: Negative.   Genitourinary: Negative.   Musculoskeletal: Negative.   Skin: Negative.   Neurological: Negative.   Endo/Heme/Allergies: Negative.   Psychiatric/Behavioral: Positive for depression. The patient is nervous/anxious.     Blood pressure 101/69, pulse 59, temperature 97.8 F (36.6 C), temperature source Oral, resp. rate 20, height 5\' 2"  (1.575 m), weight 61.236 kg (135 lb), last menstrual period 08/10/2011, SpO2 99.00%.Body mass index is 24.69 kg/(m^2).   General Appearance: Well Groomed   Engineer, water:: Good   Speech: Normal Rate   Volume: Normal   Mood: Euthymic- denies depression   Affect: Appropriate and Full Range   Thought Process: Linear   Orientation: Full (Time, Place, and Person)   Thought Content: no hallucinations, no delusions, less ruminative about pain and narcotic pain management.   Suicidal Thoughts: No- denies any suicidal ideations   Homicidal Thoughts: No- denies any homicidal ideations   Memory: NA   Judgement: Good   Insight: Fair   Psychomotor Activity: Normal   Concentration: Good   Recall: Good   Fund of Knowledge:Good   Language: Good   Akathisia: No   Handed: Right   AIMS (if indicated):   Assets: Communication Skills  Desire for  Improvement  Housing  Social Support   Sleep: Number of Hours: 5.75    Musculoskeletal:  Strength & Muscle Tone: within normal limits  Gait & Station: normal  Patient leans: N/A  DSM5:  Axis Diagnosis:   AXIS I: Polysubstance dependence including opioid type;SIMD;Depresssion with suicidal ideation;Acute stress reaction with Adjustment disorder with mixed anxiety/depression  AXIS II: Deferred  AXIS III:  Past Medical History  Diagnosis Date  . Migraine   . GERD (gastroesophageal reflux disease)     protonix evenings  . Endometriosis     chronic pain  . Anxiety     klonipin for stress disorder  . Degenerative disc disease    AXIS IV:  other psychosocial or environmental problems and problems related to social environment AXIS V:  51-60 moderate symptoms  Level of Care:  OP  Hospital Course:  Pt presented to [facility] ED 6/16 with c/o N&V causing her to increased pain due to inability to keep pain meds down.At the same time she complained of depression and suicidal ideation which she continues to express today-feeling her family would be better off with her dead. 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 Pt currently denies SI, HI, and Psychosis. At discharge, pt  rates anxiety and depression as moderate. Pt states that she does have a good supportive home environment and will followup with outpatient treatment with Dr. Arelia Sneddon. Affirms agreement with medication regimen and discharge plan. Denies other physical and psychological concerns at time of discharge.   Consults:  None  Significant  Diagnostic Studies:  None  Discharge Vitals:   Blood pressure 101/69, pulse 59, temperature 97.8 F (36.6 C), temperature source Oral, resp. rate 20, height 5\' 2"  (1.575 m), weight 61.236 kg (135 lb), last menstrual period 08/10/2011, SpO2 99.00%. Body mass index is 24.69 kg/(m^2). Lab Results:   No results found for this or any previous visit (from the past 72 hour(s)).  Physical Findings: AIMS: Facial and Oral Movements Muscles of Facial Expression: None, normal Lips and Perioral Area: None, normal Jaw: None, normal Tongue: None, normal,Extremity Movements Upper (arms, wrists, hands, fingers): None, normal Lower (legs, knees, ankles, toes): None, normal, Trunk Movements Neck, shoulders, hips: None, normal, Overall Severity Severity of abnormal movements (highest score from questions above): None, normal Incapacitation due to abnormal movements: None, normal Patient's awareness of abnormal movements (rate only patient's report): No Awareness, Dental Status Current problems with teeth and/or dentures?: No Does patient usually wear dentures?: No  CIWA:  CIWA-Ar Total: 1 COWS:  COWS Total Score: 2  Psychiatric Specialty Exam: See Psychiatric Specialty Exam and Suicide Risk Assessment completed by Attending Physician prior to discharge.  Discharge destination:  Home  Is patient on multiple antipsychotic therapies at discharge:  No   Has Patient had three or more failed trials of antipsychotic monotherapy by history:  No  Recommended Plan for Multiple Antipsychotic Therapies: NA     Medication List       Indication   acetaminophen 500 MG tablet  Commonly known as:  TYLENOL  Take 1 tablet (500 mg total) by mouth every 6 (six) hours as needed for mild pain or headache.      FLUoxetine 40 MG capsule  Commonly known as:  PROZAC  Take 2 capsules (80 mg total) by mouth daily.   Indication:  mood stabilization     gabapentin 100 MG capsule  Commonly known as:  NEURONTIN   Take 1 capsule (100 mg total) by mouth 3 (three) times daily.   Indication:  mood stabilization     hydrOXYzine 25 MG tablet  Commonly known as:  ATARAX/VISTARIL  Take 1 tablet (25 mg total) by mouth every 6 (six) hours as needed for anxiety.   Indication:  anxiety     methocarbamol 500 MG tablet  Commonly known as:  ROBAXIN  Take 1 tablet (500 mg total) by mouth every 8 (eight) hours as needed for muscle spasms.   Indication:  Musculoskeletal Pain     OxyCODONE 80 mg T12a 12 hr tablet  Commonly known as:  OXYCONTIN  Take 1 tablet (80 mg total) by mouth every 12 (twelve) hours.   Indication:  Moderate to Severe Chronic Pain     pantoprazole 40 MG tablet  Commonly known as:  PROTONIX  Take 1 tablet (40 mg total) by mouth daily.   Indication:  GERD     polyethylene glycol packet  Commonly known as:  MIRALAX / GLYCOLAX  Take 17 g by mouth 2 (two) times daily as needed (constipation).      promethazine 25 MG suppository  Commonly known as:  PHENERGAN  Place 1 suppository (25 mg total) rectally every 6 (six) hours as needed for nausea or vomiting.  risperiDONE 1 MG disintegrating tablet  Commonly known as:  RISPERDAL M-TABS  Take 1 tablet (1 mg total) by mouth at bedtime.   Indication:  mood stabilization     traZODone 25 mg Tabs tablet  Commonly known as:  DESYREL  Take 0.5 tablets (25 mg total) by mouth at bedtime as needed for sleep.   Indication:  Trouble Sleeping           Follow-up Information   Follow up with [facility] On 07/15/2013. (Appointment scheduled at 3:00 pm on this date with Will for counseling)    Contact information:   [address] [place], [place] [address] Phone: [phone] Fax: [phone]      Follow up with Dr. Arelia Sneddon - [facility] On 07/13/2013. (Wednesday, July 13, 2013 at 3:30 PM)    Contact information:   [place],  [address] Phone: [phone] Fax:        Follow-up recommendations:  Activity:  As tolerated Diet:  Heart healthy with low sodium.  Comments:  Take all medications as prescribed. Keep all follow-up appointments as scheduled.  Do not consume alcohol or use illegal drugs while on prescription medications. Report any adverse effects from your medications to your primary care provider promptly.  In the event of recurrent symptoms or worsening symptoms, call 911, a crisis hotline, or go to the nearest emergency department for evaluation.   Total Discharge Time:  Greater than 30 minutes.  Signed: Benjamine Mola, FNP-BC 07/13/2013, 3:58 PM  Patient seen, Suicide Assessment Completed.  Disposition Plan Reviewed

## 2013-11-21 ENCOUNTER — Encounter (HOSPITAL_COMMUNITY): Payer: Self-pay | Admitting: *Deleted

## 2014-02-01 ENCOUNTER — Emergency Department (HOSPITAL_COMMUNITY)
Admission: EM | Admit: 2014-02-01 | Discharge: 2014-02-01 | Disposition: A | Discharge status: Home / Self Care | Payer: Medicaid Other | Attending: Emergency Medicine | Admitting: Emergency Medicine

## 2014-02-01 ENCOUNTER — Encounter (HOSPITAL_COMMUNITY): Payer: Self-pay | Admitting: Emergency Medicine

## 2014-02-01 DIAGNOSIS — G8929 Other chronic pain: Secondary | ICD-10-CM | POA: Diagnosis not present

## 2014-02-01 DIAGNOSIS — G43909 Migraine, unspecified, not intractable, without status migrainosus: Secondary | ICD-10-CM | POA: Diagnosis not present

## 2014-02-01 DIAGNOSIS — Z8742 Personal history of other diseases of the female genital tract: Secondary | ICD-10-CM | POA: Diagnosis not present

## 2014-02-01 DIAGNOSIS — Z720 Tobacco use: Secondary | ICD-10-CM | POA: Diagnosis not present

## 2014-02-01 DIAGNOSIS — M549 Dorsalgia, unspecified: Secondary | ICD-10-CM | POA: Diagnosis present

## 2014-02-01 DIAGNOSIS — M545 Low back pain: Secondary | ICD-10-CM | POA: Diagnosis not present

## 2014-02-01 DIAGNOSIS — K219 Gastro-esophageal reflux disease without esophagitis: Secondary | ICD-10-CM | POA: Insufficient documentation

## 2014-02-01 DIAGNOSIS — Z79899 Other long term (current) drug therapy: Secondary | ICD-10-CM | POA: Diagnosis not present

## 2014-02-01 DIAGNOSIS — F419 Anxiety disorder, unspecified: Secondary | ICD-10-CM | POA: Diagnosis not present

## 2014-02-01 DIAGNOSIS — Z880 Allergy status to penicillin: Secondary | ICD-10-CM | POA: Insufficient documentation

## 2014-02-01 MED ORDER — HYDROMORPHONE HCL 1 MG/ML IJ SOLN
1.0000 mg | Freq: Once | INTRAMUSCULAR | Status: AC
  Administered 2014-02-01: 1 mg via INTRAMUSCULAR
  Filled 2014-02-01: qty 1

## 2014-02-01 NOTE — ED Notes (Signed)
Pt has DDD, c/o lower back pain.

## 2014-02-01 NOTE — ED Provider Notes (Signed)
CSN: [id number]     Arrival date & time 02/01/14  1030 History   First MD Initiated Contact with Patient 02/01/14 1044     Chief Complaint  Patient presents with  . Back Pain     (Consider location/radiation/quality/duration/timing/severity/associated sxs/prior Treatment) HPI  [name] [name] is a 34 year old female with past medical history of degenerative disc disease and chronic back pain who presents the ER complaining of exacerbation of back pain. Patient states her pain is consistent with her chronic pain, she denies any new symptoms or aspect of it. She states she ran out of her pain medication yesterday, and is supposed to have a follow-up appointment tomorrow at 10:30 AM with her PCP to refill her medication. Patient states she is only requesting pain control today. Patient denies any numbness, weakness, new pain, saddle anesthesia, bowel/bladder incontinence/retention. Patient denies IV drug use or history of cancer.  Past Medical History  Diagnosis Date  . Migraine   . GERD (gastroesophageal reflux disease)     protonix evenings  . Endometriosis     chronic pain  . Anxiety     klonipin for stress disorder  . Degenerative disc disease    Past Surgical History  Procedure Laterality Date  . Appendectomy    . Tonsillectomy    . Tubal ligation    . Laparoscopic assisted vaginal hysterectomy  08/19/2011    Procedure: LAPAROSCOPIC ASSISTED VAGINAL HYSTERECTOMY;  Surgeon: Gus Height, MD;  Location: [facility];  Service: Gynecology;  Laterality: N/A;  . Abdominal hysterectomy     Family History  Problem Relation Age of Onset  . Coronary artery disease Father   . Coronary artery disease Maternal Grandmother   . Breast cancer Mother   . Cancer Mother    History  Substance Use Topics  . Smoking status: Current Every Day Smoker -- 1.00 packs/day for 14 years    Types: Cigarettes  . Smokeless tobacco: Not on file  . Alcohol Use: No   OB History    Gravida Para Term Preterm AB  TAB SAB Ectopic Multiple Living   6 2             Review of Systems  Constitutional: Negative for fever.  Gastrointestinal: Negative for nausea and vomiting.  Musculoskeletal: Positive for back pain and arthralgias. Negative for gait problem.  Neurological: Negative for weakness and numbness.      Allergies  Epidural tray 17gx3-1-2"; Ibuprofen; Penicillins; and Zofran  Home Medications   Prior to Admission medications   Medication Sig Start Date End Date Taking? Authorizing Provider  acetaminophen (TYLENOL) 500 MG tablet Take 1 tablet (500 mg total) by mouth every 6 (six) hours as needed for mild pain or headache. 07/13/13   Benjamine Mola, FNP  FLUoxetine (PROZAC) 40 MG capsule Take 2 capsules (80 mg total) by mouth daily. 07/13/13   Benjamine Mola, FNP  gabapentin (NEURONTIN) 100 MG capsule Take 1 capsule (100 mg total) by mouth 3 (three) times daily. 07/13/13   Benjamine Mola, FNP  hydrOXYzine (ATARAX/VISTARIL) 25 MG tablet Take 1 tablet (25 mg total) by mouth every 6 (six) hours as needed for anxiety. 07/13/13   Benjamine Mola, FNP  methocarbamol (ROBAXIN) 500 MG tablet Take 1 tablet (500 mg total) by mouth every 8 (eight) hours as needed for muscle spasms. 07/13/13   Benjamine Mola, FNP  OxyCODONE (OXYCONTIN) 80 mg T12A 12 hr tablet Take 1 tablet (80 mg total) by mouth every 12 (twelve) hours. 07/13/13  Benjamine Mola, FNP  pantoprazole (PROTONIX) 40 MG tablet Take 1 tablet (40 mg total) by mouth daily. 07/13/13   Benjamine Mola, FNP  polyethylene glycol (MIRALAX / GLYCOLAX) packet Take 17 g by mouth 2 (two) times daily as needed (constipation).     Historical Provider, MD  promethazine (PHENERGAN) 25 MG suppository Place 1 suppository (25 mg total) rectally every 6 (six) hours as needed for nausea or vomiting. 07/06/13   Evelina Bucy, MD  risperiDONE (RISPERDAL M-TABS) 1 MG disintegrating tablet Take 1 tablet (1 mg total) by mouth at bedtime. 07/13/13   Benjamine Mola, FNP  traZODone  (DESYREL) 25 mg TABS tablet Take 0.5 tablets (25 mg total) by mouth at bedtime as needed for sleep. 07/13/13   Elyse Jarvis Withrow, FNP   BP 111/74 mmHg  Pulse 116  Temp(Src) 98.9 F (37.2 C) (Oral)  Resp 20  SpO2 99%  LMP 08/10/2011 Physical Exam  Constitutional: She is oriented to person, place, and time. She appears well-developed and well-nourished. No distress.  HENT:  Head: Normocephalic and atraumatic.  Eyes: Right eye exhibits no discharge. Left eye exhibits no discharge. No scleral icterus.  Neck: Normal range of motion.  Pulmonary/Chest: Effort normal. No respiratory distress.  Musculoskeletal: Normal range of motion.       Back:  Spinous processes tenderness diffusely throughout patient's lumbar spine. Pain increases with range of motion, normal active and passive range of motion.  Neurological: She is alert and oriented to person, place, and time. She has normal strength. No cranial nerve deficit or sensory deficit. She displays a negative Romberg sign. Gait normal. GCS eye subscore is 4. GCS verbal subscore is 5. GCS motor subscore is 6.  Patient fully alert, answering questions appropriately in full, clear sentences. Cranial nerves II through XII grossly intact. Motor strength 5 out of 5 in all major muscle groups of upper and lower extremity's. Distal sensation intact.  Skin: Skin is warm and dry. She is not diaphoretic.  Psychiatric: She has a normal mood and affect.  Nursing note and vitals reviewed.   ED Course  Procedures (including critical care time) Labs Review Labs Reviewed - No data to display  Imaging Review No results found.   EKG Interpretation None      MDM   Final diagnoses:  Chronic back pain    No new injury. No neurological deficits and normal neuro exam.  Patient can walk but states is painful.  No loss of bowel or bladder control.  No concern for cauda equina.  No fever, night sweats, weight loss, h/o cancer, IVDU.  RICE protocol and pain  medicine alternative reviewed with the patient. Patient is pain treated in the ER, patient notified that we will be unable to provide a prescription for pain medicine for patient's chronic pain as she is currently being managed by a primary care provider. Patient verbalizes understanding of this.  Patient initially tachycardic, however patient states she's been out of her opiate pain medication for 24 hours, tachycardia thought to be due to mild withdrawal. Patient denying any other withdrawal symptoms. Patient non-tachypnea, non-tachycardic, normotensive, non-hypoxic and in no acute distress. No obvious tremors. Patient's heart rate decreased in the low 100s after pain medication given. I discussed return precautions with patient, and strongly encouraged her to follow-up with her primary care physician tomorrow. Patient verbalizes understanding and agreement of this plan. I encouraged patient to call or return to ER should she have any questions or concerns.  BP  111/74 mmHg  Pulse 116  Temp(Src) 98.9 F (37.2 C) (Oral)  Resp 20  SpO2 99%  LMP 08/10/2011  Signed,  Dahlia Bailiff, PA-C 9:20 PM  Patient discussed with Dr. Francine Graven, M.D.  Carrie Mew, PA-C 02/01/14 2118  Carrie Mew, PA-C 02/01/14 2120  Francine Graven, DO 02/04/14 [phone]

## 2014-02-01 NOTE — Discharge Instructions (Signed)
Chronic Back Pain  When back pain lasts longer than 3 months, it is called chronic back pain.People with chronic back pain often go through certain periods that are more intense (flare-ups).  CAUSES Chronic back pain can be caused by wear and tear (degeneration) on different structures in your back. These structures include:  The bones of your spine (vertebrae) and the joints surrounding your spinal cord and nerve roots (facets).  The strong, fibrous tissues that connect your vertebrae (ligaments). Degeneration of these structures may result in pressure on your nerves. This can lead to constant pain. HOME CARE INSTRUCTIONS  Avoid bending, heavy lifting, prolonged sitting, and activities which make the problem worse.  Take brief periods of rest throughout the day to reduce your pain. Lying down or standing usually is better than sitting while you are resting.  Take over-the-counter or prescription medicines only as directed by your caregiver. SEEK IMMEDIATE MEDICAL CARE IF:   You have weakness or numbness in one of your legs or feet.  You have trouble controlling your bladder or bowels.  You have nausea, vomiting, abdominal pain, shortness of breath, or fainting. Document Released: 02/14/2004 Document Revised: 03/31/2011 Document Reviewed: 12/21/2010 [facility] Patient Information 2015 [place], [place]. This information is not intended to replace advice given to you by your health care provider. Make sure you discuss any questions you have with your health care provider.   Back Exercises Back exercises help treat and prevent back injuries. The goal of back exercises is to increase the strength of your abdominal and back muscles and the flexibility of your back. These exercises should be started when you no longer have back pain. Back exercises include:  Pelvic Tilt. Lie on your back with your knees bent. Tilt your pelvis until the lower part of your back is against the floor. Hold this  position 5 to 10 sec and repeat 5 to 10 times.  Knee to Chest. Pull first 1 knee up against your chest and hold for 20 to 30 seconds, repeat this with the other knee, and then both knees. This may be done with the other leg straight or bent, whichever feels better.  Sit-Ups or Curl-Ups. Bend your knees 90 degrees. Start with tilting your pelvis, and do a partial, slow sit-up, lifting your trunk only 30 to 45 degrees off the floor. Take at least 2 to 3 seconds for each sit-up. Do not do sit-ups with your knees out straight. If partial sit-ups are difficult, simply do the above but with only tightening your abdominal muscles and holding it as directed.  Hip-Lift. Lie on your back with your knees flexed 90 degrees. Push down with your feet and shoulders as you raise your hips a couple inches off the floor; hold for 10 seconds, repeat 5 to 10 times.  Back arches. Lie on your stomach, propping yourself up on bent elbows. Slowly press on your hands, causing an arch in your low back. Repeat 3 to 5 times. Any initial stiffness and discomfort should lessen with repetition over time.  Shoulder-Lifts. Lie face down with arms beside your body. Keep hips and torso pressed to floor as you slowly lift your head and shoulders off the floor. Do not overdo your exercises, especially in the beginning. Exercises may cause you some mild back discomfort which lasts for a few minutes; however, if the pain is more severe, or lasts for more than 15 minutes, do not continue exercises until you see your caregiver. Improvement with exercise therapy for back  problems is slow.  See your caregivers for assistance with developing a proper back exercise program. Document Released: 02/14/2004 Document Revised: 03/31/2011 Document Reviewed: 11/07/2010 [facility] Patient Information 2015 [place], [place]. This information is not intended to replace advice given to you by your health care provider. Make sure you discuss any questions you  have with your health care provider.

## 2014-03-15 ENCOUNTER — Encounter (HOSPITAL_COMMUNITY): Payer: Self-pay

## 2014-03-15 ENCOUNTER — Emergency Department (HOSPITAL_COMMUNITY)
Admission: EM | Admit: 2014-03-15 | Discharge: 2014-03-15 | Disposition: A | Discharge status: Home / Self Care | Payer: Medicaid Other | Attending: Emergency Medicine | Admitting: Emergency Medicine

## 2014-03-15 DIAGNOSIS — Z8639 Personal history of other endocrine, nutritional and metabolic disease: Secondary | ICD-10-CM | POA: Insufficient documentation

## 2014-03-15 DIAGNOSIS — Z79899 Other long term (current) drug therapy: Secondary | ICD-10-CM | POA: Insufficient documentation

## 2014-03-15 DIAGNOSIS — K088 Other specified disorders of teeth and supporting structures: Secondary | ICD-10-CM | POA: Insufficient documentation

## 2014-03-15 DIAGNOSIS — Z8679 Personal history of other diseases of the circulatory system: Secondary | ICD-10-CM | POA: Insufficient documentation

## 2014-03-15 DIAGNOSIS — K0889 Other specified disorders of teeth and supporting structures: Secondary | ICD-10-CM

## 2014-03-15 DIAGNOSIS — Z880 Allergy status to penicillin: Secondary | ICD-10-CM | POA: Insufficient documentation

## 2014-03-15 DIAGNOSIS — Z87448 Personal history of other diseases of urinary system: Secondary | ICD-10-CM | POA: Insufficient documentation

## 2014-03-15 DIAGNOSIS — Z792 Long term (current) use of antibiotics: Secondary | ICD-10-CM | POA: Insufficient documentation

## 2014-03-15 DIAGNOSIS — K219 Gastro-esophageal reflux disease without esophagitis: Secondary | ICD-10-CM | POA: Insufficient documentation

## 2014-03-15 DIAGNOSIS — M549 Dorsalgia, unspecified: Secondary | ICD-10-CM

## 2014-03-15 DIAGNOSIS — G8929 Other chronic pain: Secondary | ICD-10-CM | POA: Insufficient documentation

## 2014-03-15 DIAGNOSIS — Z720 Tobacco use: Secondary | ICD-10-CM | POA: Insufficient documentation

## 2014-03-15 MED ORDER — CLINDAMYCIN HCL 300 MG PO CAPS: 300.0000 mg | ORAL_CAPSULE | Freq: Four times a day (QID) | ORAL | Status: DC

## 2014-03-15 MED ORDER — OXYCODONE-ACETAMINOPHEN 5-325 MG PO TABS
2.0000 | ORAL_TABLET | Freq: Once | ORAL | Status: AC
  Administered 2014-03-15: 2 via ORAL
  Filled 2014-03-15: qty 2

## 2014-03-15 MED ORDER — BUPIVACAINE-EPINEPHRINE (PF) 0.5% -1:200000 IJ SOLN
1.8000 mL | Freq: Once | INTRAMUSCULAR | Status: DC
  Filled 2014-03-15: qty 1.8

## 2014-03-15 MED ORDER — KETOROLAC TROMETHAMINE 60 MG/2ML IM SOLN
60.0000 mg | Freq: Once | INTRAMUSCULAR | Status: AC
  Administered 2014-03-15: 60 mg via INTRAMUSCULAR
  Filled 2014-03-15: qty 2

## 2014-03-15 NOTE — ED Notes (Signed)
Pt presents with c/o dental pain and back pain. Pt reports she heard a "pop" in her right cheek area yesterday and had some drainage in her mouth, believes it to be a tooth abscess that was draining. Pt also has chronic back pain, Medicaid has been messed up so she has been unable to get her back shots.

## 2014-03-15 NOTE — Discharge Instructions (Signed)
Please follow-up with your dentist for your regularly scheduled appointment next Wednesday. Please take her antibiotic as directed to prevent any sort of infection from occurring. He will also need to follow-up with your primary care for further evaluation and management of your back pain. Return to ED for new or worsening symptoms.

## 2014-03-15 NOTE — ED Provider Notes (Signed)
CSN: [id number]     Arrival date & time 03/15/14  1223 History  This chart was scribed for non-physician practitioner, Jaquita Folds, PA-C, working with Debby Freiberg, MD by Lowella Petties, ED Scribe. The patient was seen in room WTR5/WTR5. Patient's care was started at 12:49 PM.    Chief Complaint  Patient presents with  . Dental Pain  . Back Pain   The history is provided by the patient. No language interpreter was used.   HPI Comments: [name] [name] is a 34 y.o. female who presents to the Emergency Department complaining of constant, aching, right, upper dental pain after breaking her tooth two weeks ago. She states that the pain became acutely worse yesterday after she heard a pop and felt drainage from the tooth. She rates her pain as an 8/10 and reports taking Aleve and Naproxen without relief. She states that she has an appointment scheduled with her dentist in 7 days. She denies fever or chills.   She additionally reports severe, aching, lower back pain. She states that she has a history of chronic back pain like this. She reports that she is scheduled to have back surgery and a shot in her back, but she is waiting to get her medicaid back. She reports that she has been taking pain medication for this without relief.   Past Medical History  Diagnosis Date  . Migraine   . GERD (gastroesophageal reflux disease)     protonix evenings  . Endometriosis     chronic pain  . Anxiety     klonipin for stress disorder  . Degenerative disc disease    Past Surgical History  Procedure Laterality Date  . Appendectomy    . Tonsillectomy    . Tubal ligation    . Laparoscopic assisted vaginal hysterectomy  08/19/2011    Procedure: LAPAROSCOPIC ASSISTED VAGINAL HYSTERECTOMY;  Surgeon: Gus Height, MD;  Location: [facility];  Service: Gynecology;  Laterality: N/A;  . Abdominal hysterectomy     Family History  Problem Relation Age of Onset  . Coronary artery disease Father   . Coronary artery  disease Maternal Grandmother   . Breast cancer Mother   . Cancer Mother    History  Substance Use Topics  . Smoking status: Current Every Day Smoker -- 1.00 packs/day for 14 years    Types: Cigarettes  . Smokeless tobacco: Not on file  . Alcohol Use: No   OB History    Gravida Para Term Preterm AB TAB SAB Ectopic Multiple Living   6 2             Review of Systems  Constitutional: Negative for fever and chills.  HENT: Positive for dental problem.   Respiratory: Negative for shortness of breath.   Cardiovascular: Negative for chest pain.  Skin: Negative for rash and wound.  Neurological: Negative for weakness and numbness.  All other systems reviewed and are negative.  Allergies  Epidural tray 17gx3-1-2"; Ibuprofen; Penicillins; and Zofran  Home Medications   Prior to Admission medications   Medication Sig Start Date End Date Taking? Authorizing Provider  acetaminophen (TYLENOL) 500 MG tablet Take 1 tablet (500 mg total) by mouth every 6 (six) hours as needed for mild pain or headache. 07/13/13   Benjamine Mola, FNP  clindamycin (CLEOCIN) 300 MG capsule Take 1 capsule (300 mg total) by mouth 4 (four) times daily. X 7 days 03/15/14   Viona Gilmore Perle Brickhouse, PA-C  FLUoxetine (PROZAC) 40 MG capsule Take 2 capsules (  80 mg total) by mouth daily. 07/13/13   Benjamine Mola, FNP  gabapentin (NEURONTIN) 100 MG capsule Take 1 capsule (100 mg total) by mouth 3 (three) times daily. 07/13/13   Benjamine Mola, FNP  hydrOXYzine (ATARAX/VISTARIL) 25 MG tablet Take 1 tablet (25 mg total) by mouth every 6 (six) hours as needed for anxiety. 07/13/13   Benjamine Mola, FNP  methocarbamol (ROBAXIN) 500 MG tablet Take 1 tablet (500 mg total) by mouth every 8 (eight) hours as needed for muscle spasms. 07/13/13   Benjamine Mola, FNP  OxyCODONE (OXYCONTIN) 80 mg T12A 12 hr tablet Take 1 tablet (80 mg total) by mouth every 12 (twelve) hours. 07/13/13   Benjamine Mola, FNP  pantoprazole (PROTONIX) 40 MG tablet Take 1  tablet (40 mg total) by mouth daily. 07/13/13   Benjamine Mola, FNP  polyethylene glycol (MIRALAX / GLYCOLAX) packet Take 17 g by mouth 2 (two) times daily as needed (constipation).     Historical Provider, MD  promethazine (PHENERGAN) 25 MG suppository Place 1 suppository (25 mg total) rectally every 6 (six) hours as needed for nausea or vomiting. 07/06/13   Evelina Bucy, MD  risperiDONE (RISPERDAL M-TABS) 1 MG disintegrating tablet Take 1 tablet (1 mg total) by mouth at bedtime. 07/13/13   Benjamine Mola, FNP  traZODone (DESYREL) 25 mg TABS tablet Take 0.5 tablets (25 mg total) by mouth at bedtime as needed for sleep. 07/13/13   Benjamine Mola, FNP   Triage Vitals: BP 125/76 mmHg  Pulse 109  Temp(Src) 98 F (36.7 C) (Oral)  Resp 16  SpO2 100%  LMP 08/10/2011 Physical Exam  Constitutional: She is oriented to person, place, and time. She appears well-developed and well-nourished. No distress.  HENT:  Head: Normocephalic and atraumatic.  Fractured upper right canine. Overall poor dentition. Multiple missing teeth with active caries. Will need follow-up with dentistry Mucous membranes are moist. No unilateral tonsillar swelling, uvula midline, no glossal swelling or elevation. No trismus. No fluctuance, drainage or evidence of a drainable abscess. No other evidence of emergent infection, Ludwig or Vincents angina. Tolerating secretions well. Patent airway   Eyes: Conjunctivae and EOM are normal.  Neck: Neck supple. No tracheal deviation present.  Cardiovascular: Normal rate.   Pulmonary/Chest: Effort normal. No respiratory distress.  Musculoskeletal: Normal range of motion.  Diffuse paraspinal lumbar tenderness without any midline bony tenderness. Patient moves all 4 extremities without ataxia. Motor and sensation grossly intact. Gait is baseline  Lymphadenopathy:    She has no cervical adenopathy.  Neurological: She is alert and oriented to person, place, and time.  Skin: Skin is warm and dry.   Psychiatric: She has a normal mood and affect. Her behavior is normal.  Nursing note and vitals reviewed.   ED Course  Procedures (including critical care time) DIAGNOSTIC STUDIES: Oxygen Saturation is 100% on room air, normal by my interpretation.    COORDINATION OF CARE: 12:53 PM-Discussed treatment plan which includes antibiotics and a dental block with pt at bedside and pt agreed to plan.   Labs Review Labs Reviewed - No data to display  Imaging Review No results found.   EKG Interpretation None     Meds given in ED:  Medications  oxyCODONE-acetaminophen (PERCOCET/ROXICET) 5-325 MG per tablet 2 tablet (2 tablets Oral Given 03/15/14 1316)  ketorolac (TORADOL) injection 60 mg (60 mg Intramuscular Given 03/15/14 1343)    Discharge Medication List as of 03/15/2014  1:34 PM    START  taking these medications   Details  clindamycin (CLEOCIN) 300 MG capsule Take 1 capsule (300 mg total) by mouth 4 (four) times daily. X 7 days, Starting 03/15/2014, Until Discontinued, Print       Filed Vitals:   03/15/14 1233 03/15/14 1406  BP: 125/76 116/72  Pulse: 109 104  Temp: 98 F (36.7 C)   TempSrc: Oral   Resp: 16 12  SpO2: 100% 98%    MDM  Vitals stable  -afebrile. Pt tachycardia appears to be baseline. Pt resting comfortably in ED. patient states pain is improved after performing oral nerve block. Patient states that since she cannot have Dilaudid in the fast-track she will "settle for Toradol". Reports she is not driving and will ride the bus. PE--not concerning further acute or emergent pathology. No evidence of Ludwig angina, peritonsillar abscess. Back pain is a chronic problem, unchanged. Will DC with antibiotics, NSAIDs. Patient receives narcotic pain medicine outpatient. Encouraged fu with her dentist for regularly scheduled appt next week. I discussed all relevant lab findings and imaging results with pt and they verbalized understanding. Discussed f/u with PCP within  48 hrs and return precautions, pt very amenable to plan.  Final diagnoses:  Pain, dental  Chronic back pain   I personally performed the services described in this documentation, which was scribed in my presence. The recorded information has been reviewed and is accurate.      Verl Dicker, PA-C 03/16/14 [place], PA-C 03/16/14 1226  Debby Freiberg, MD 03/16/14 [phone]

## 2014-04-26 ENCOUNTER — Emergency Department (HOSPITAL_COMMUNITY)
Admission: EM | Admit: 2014-04-26 | Discharge: 2014-04-27 | Disposition: A | Discharge status: Home / Self Care | Payer: Medicaid Other | Attending: Emergency Medicine | Admitting: Emergency Medicine

## 2014-04-26 ENCOUNTER — Encounter (HOSPITAL_COMMUNITY): Payer: Self-pay | Admitting: Emergency Medicine

## 2014-04-26 DIAGNOSIS — Z87448 Personal history of other diseases of urinary system: Secondary | ICD-10-CM | POA: Diagnosis not present

## 2014-04-26 DIAGNOSIS — M549 Dorsalgia, unspecified: Secondary | ICD-10-CM

## 2014-04-26 DIAGNOSIS — G8929 Other chronic pain: Secondary | ICD-10-CM

## 2014-04-26 DIAGNOSIS — R45851 Suicidal ideations: Secondary | ICD-10-CM | POA: Insufficient documentation

## 2014-04-26 DIAGNOSIS — Z880 Allergy status to penicillin: Secondary | ICD-10-CM | POA: Diagnosis not present

## 2014-04-26 DIAGNOSIS — K219 Gastro-esophageal reflux disease without esophagitis: Secondary | ICD-10-CM | POA: Insufficient documentation

## 2014-04-26 DIAGNOSIS — Z720 Tobacco use: Secondary | ICD-10-CM | POA: Diagnosis not present

## 2014-04-26 DIAGNOSIS — F1920 Other psychoactive substance dependence, uncomplicated: Secondary | ICD-10-CM

## 2014-04-26 DIAGNOSIS — F332 Major depressive disorder, recurrent severe without psychotic features: Secondary | ICD-10-CM | POA: Diagnosis not present

## 2014-04-26 DIAGNOSIS — F329 Major depressive disorder, single episode, unspecified: Secondary | ICD-10-CM | POA: Diagnosis present

## 2014-04-26 DIAGNOSIS — R000 Tachycardia, unspecified: Secondary | ICD-10-CM | POA: Diagnosis not present

## 2014-04-26 DIAGNOSIS — Z79899 Other long term (current) drug therapy: Secondary | ICD-10-CM | POA: Diagnosis not present

## 2014-04-26 DIAGNOSIS — G43909 Migraine, unspecified, not intractable, without status migrainosus: Secondary | ICD-10-CM | POA: Diagnosis not present

## 2014-04-26 HISTORY — DX: Dorsalgia, unspecified: M54.9

## 2014-04-26 HISTORY — DX: Other chronic pain: G89.29

## 2014-04-26 LAB — COMPREHENSIVE METABOLIC PANEL
ALT: 17 U/L (ref 0–35)
AST: 19 U/L (ref 0–37)
Albumin: 4 g/dL (ref 3.5–5.2)
Alkaline Phosphatase: 95 U/L (ref 39–117)
Anion gap: 10 (ref 5–15)
BUN: 9 mg/dL (ref 6–23)
CO2: 23 mmol/L (ref 19–32)
Calcium: 9.3 mg/dL (ref 8.4–10.5)
Chloride: 107 mmol/L (ref 96–112)
Creatinine, Ser: 0.65 mg/dL (ref 0.50–1.10)
GFR calc Af Amer: >90 mL/min (ref >90)
GFR calc non Af Amer: >90 mL/min (ref >90)
Glucose, Bld: 120 mg/dL — ABNORMAL HIGH (ref 70–99)
Potassium: 3.4 mmol/L — ABNORMAL LOW (ref 3.5–5.1)
Sodium: 140 mmol/L (ref 135–145)
Total Bilirubin: 0.1 mg/dL — ABNORMAL LOW (ref 0.3–1.2)
Total Protein: 8.2 g/dL (ref 6.0–8.3)

## 2014-04-26 LAB — RAPID URINE DRUG SCREEN, HOSP PERFORMED
Amphetamines: NOT DETECTED
Barbiturates: NOT DETECTED
Benzodiazepines: NOT DETECTED
Cocaine: NOT DETECTED
Opiates: POSITIVE — AB
Tetrahydrocannabinol: NOT DETECTED

## 2014-04-26 LAB — ETHANOL: Alcohol, Ethyl (B): <5 mg/dL (ref 0–9)

## 2014-04-26 LAB — CBC
HCT: 38.5 % (ref 36.0–46.0)
Hemoglobin: 12.7 g/dL (ref 12.0–15.0)
MCH: 28.5 pg (ref 26.0–34.0)
MCHC: 33 g/dL (ref 30.0–36.0)
MCV: 86.5 fL (ref 78.0–100.0)
Platelets: 306 10*3/uL (ref 150–400)
RBC: 4.45 MIL/uL (ref 3.87–5.11)
RDW: 13.2 % (ref 11.5–15.5)
WBC: 5.5 10*3/uL (ref 4.0–10.5)

## 2014-04-26 LAB — ACETAMINOPHEN LEVEL: Acetaminophen (Tylenol), Serum: <10 ug/mL — ABNORMAL LOW (ref 10–30)

## 2014-04-26 LAB — SALICYLATE LEVEL: Salicylate Lvl: <4 mg/dL (ref 2.8–20.0)

## 2014-04-26 MED ORDER — FLUOXETINE HCL 20 MG PO CAPS
80.0000 mg | ORAL_CAPSULE | Freq: Every day | ORAL | Status: DC
  Administered 2014-04-26 – 2014-04-27 (×2): 80 mg via ORAL
  Filled 2014-04-26 (×2): qty 4

## 2014-04-26 MED ORDER — AMITRIPTYLINE HCL 25 MG PO TABS
50.0000 mg | ORAL_TABLET | Freq: Every day | ORAL | Status: DC
  Administered 2014-04-26: 50 mg via ORAL
  Filled 2014-04-26: qty 2

## 2014-04-26 MED ORDER — GABAPENTIN 100 MG PO CAPS
100.0000 mg | ORAL_CAPSULE | Freq: Three times a day (TID) | ORAL | Status: DC
  Administered 2014-04-26 – 2014-04-27 (×3): 100 mg via ORAL
  Filled 2014-04-26 (×3): qty 1

## 2014-04-26 MED ORDER — RISPERIDONE 1 MG PO TBDP
1.0000 mg | ORAL_TABLET | Freq: Every day | ORAL | Status: DC
Start: 2014-04-26 — End: 2014-04-27
  Administered 2014-04-26: 1 mg via ORAL
  Filled 2014-04-26 (×2): qty 1

## 2014-04-26 MED ORDER — LOPERAMIDE HCL 2 MG PO CAPS
2.0000 mg | ORAL_CAPSULE | ORAL | Status: DC | PRN
  Administered 2014-04-26: 2 mg via ORAL
  Filled 2014-04-26: qty 1

## 2014-04-26 MED ORDER — HYDROXYZINE HCL 25 MG PO TABS
25.0000 mg | ORAL_TABLET | Freq: Four times a day (QID) | ORAL | Status: DC | PRN
  Administered 2014-04-26 – 2014-04-27 (×2): 25 mg via ORAL
  Filled 2014-04-26 (×2): qty 1

## 2014-04-26 MED ORDER — CLONIDINE HCL 0.1 MG PO TABS: 0.1000 mg | ORAL_TABLET | Freq: Every day | ORAL | Status: DC

## 2014-04-26 MED ORDER — DICYCLOMINE HCL 20 MG PO TABS
20.0000 mg | ORAL_TABLET | Freq: Four times a day (QID) | ORAL | Status: DC | PRN
  Administered 2014-04-26 – 2014-04-27 (×2): 20 mg via ORAL
  Filled 2014-04-26 (×2): qty 1

## 2014-04-26 MED ORDER — CLONIDINE HCL 0.1 MG PO TABS
0.1000 mg | ORAL_TABLET | Freq: Four times a day (QID) | ORAL | Status: DC
  Administered 2014-04-26 – 2014-04-27 (×3): 0.1 mg via ORAL
  Filled 2014-04-26 (×3): qty 1

## 2014-04-26 MED ORDER — TRAZODONE HCL 50 MG PO TABS
25.0000 mg | ORAL_TABLET | Freq: Every evening | ORAL | Status: DC | PRN
  Administered 2014-04-26: 25 mg via ORAL
  Filled 2014-04-26: qty 1

## 2014-04-26 MED ORDER — CLONIDINE HCL 0.1 MG PO TABS: 0.1000 mg | ORAL_TABLET | ORAL | Status: DC

## 2014-04-26 MED ORDER — METHOCARBAMOL 500 MG PO TABS
500.0000 mg | ORAL_TABLET | Freq: Three times a day (TID) | ORAL | Status: DC | PRN
  Administered 2014-04-26 – 2014-04-27 (×2): 500 mg via ORAL
  Filled 2014-04-26 (×2): qty 1

## 2014-04-26 MED ORDER — ARIPIPRAZOLE 10 MG PO TABS
10.0000 mg | ORAL_TABLET | Freq: Every day | ORAL | Status: DC
  Administered 2014-04-26 – 2014-04-27 (×2): 10 mg via ORAL
  Filled 2014-04-26 (×2): qty 1

## 2014-04-26 MED ORDER — LORATADINE 10 MG PO TABS
10.0000 mg | ORAL_TABLET | Freq: Every day | ORAL | Status: DC | PRN
  Administered 2014-04-26: 10 mg via ORAL
  Filled 2014-04-26 (×2): qty 1

## 2014-04-26 MED ORDER — ACETAMINOPHEN 500 MG PO TABS
500.0000 mg | ORAL_TABLET | Freq: Four times a day (QID) | ORAL | Status: DC | PRN
  Administered 2014-04-27: 500 mg via ORAL
  Filled 2014-04-26: qty 1

## 2014-04-26 MED ORDER — PANTOPRAZOLE SODIUM 40 MG PO TBEC
40.0000 mg | DELAYED_RELEASE_TABLET | Freq: Every day | ORAL | Status: DC
  Administered 2014-04-26 – 2014-04-27 (×2): 40 mg via ORAL
  Filled 2014-04-26 (×2): qty 1

## 2014-04-26 MED ORDER — PROMETHAZINE HCL 25 MG PO TABS
12.5000 mg | ORAL_TABLET | ORAL | Status: DC | PRN
  Administered 2014-04-26 – 2014-04-27 (×2): 12.5 mg via ORAL
  Filled 2014-04-26 (×3): qty 1

## 2014-04-26 MED ORDER — PROMETHAZINE HCL 25 MG RE SUPP
25.0000 mg | Freq: Four times a day (QID) | RECTAL | Status: DC | PRN
  Filled 2014-04-26: qty 1

## 2014-04-26 MED ORDER — LISDEXAMFETAMINE DIMESYLATE 30 MG PO CAPS
60.0000 mg | ORAL_CAPSULE | Freq: Every morning | ORAL | Status: DC
  Administered 2014-04-27: 60 mg via ORAL
  Filled 2014-04-26: qty 2

## 2014-04-26 NOTE — ED Notes (Signed)
Pt requested that the doctor is informed that "she is willing to see the psychiatrist now."  RN notified.

## 2014-04-26 NOTE — ED Provider Notes (Signed)
CSN: [id number]     Arrival date & time 04/26/14  1407 History   First MD Initiated Contact with Patient 04/26/14 1501     Chief Complaint  Patient presents with  . Back Pain  . Suicidal     (Consider location/radiation/quality/duration/timing/severity/associated sxs/prior Treatment) HPI   33yF with chronic back pain. Takes opana. Apparently pharmacy she goes to is currently out. Says her back pain is so severe that she wishes she would just die and is requesting prescription. When asked how she would fill it if her pharmacy is out she then asked for 80mg  oxycontin instead. . This pain has been going on for years. Denies acute change. No acute trauma. No urinary complaints. No acute numbness, tingling or loss of strength. No fever or chills.   Past Medical History  Diagnosis Date  . Migraine   . GERD (gastroesophageal reflux disease)     protonix evenings  . Endometriosis     chronic pain  . Anxiety     klonipin for stress disorder  . Degenerative disc disease   . Chronic back pain    Past Surgical History  Procedure Laterality Date  . Appendectomy    . Tonsillectomy    . Tubal ligation    . Laparoscopic assisted vaginal hysterectomy  08/19/2011    Procedure: LAPAROSCOPIC ASSISTED VAGINAL HYSTERECTOMY;  Surgeon: Gus Height, MD;  Location: [facility];  Service: Gynecology;  Laterality: N/A;  . Abdominal hysterectomy     Family History  Problem Relation Age of Onset  . Coronary artery disease Father   . Coronary artery disease Maternal Grandmother   . Breast cancer Mother   . Cancer Mother    History  Substance Use Topics  . Smoking status: Current Every Day Smoker -- 1.00 packs/day for 14 years    Types: Cigarettes  . Smokeless tobacco: Not on file  . Alcohol Use: No   OB History    Gravida Para Term Preterm AB TAB SAB Ectopic Multiple Living   6 2             Review of Systems  All systems reviewed and negative, other than as noted in HPI.   Allergies  Epidural  tray 17gx3-1-2"; Ibuprofen; Penicillins; and Zofran  Home Medications   Prior to Admission medications   Medication Sig Start Date End Date Taking? Authorizing Provider  ABILIFY 10 MG tablet Take 10 mg by mouth daily. 04/04/14  Yes Historical Provider, MD  acetaminophen (TYLENOL) 500 MG tablet Take 1 tablet (500 mg total) by mouth every 6 (six) hours as needed for mild pain or headache. 07/13/13  Yes Benjamine Mola, FNP  amitriptyline (ELAVIL) 25 MG tablet Take 50 mg by mouth at bedtime. 03/28/14  Yes Historical Provider, MD  lisdexamfetamine (VYVANSE) 60 MG capsule Take 60 mg by mouth every morning.   Yes Historical Provider, MD  oxymorphone (OPANA) 10 MG tablet Take 10-20 mg by mouth every 4 (four) hours as needed for pain.   Yes Historical Provider, MD  clindamycin (CLEOCIN) 300 MG capsule Take 1 capsule (300 mg total) by mouth 4 (four) times daily. X 7 days Patient not taking: Reported on 04/26/2014 03/15/14   Comer Locket, PA-C  FLUoxetine (PROZAC) 40 MG capsule Take 2 capsules (80 mg total) by mouth daily. Patient not taking: Reported on 04/26/2014 07/13/13   Benjamine Mola, FNP  gabapentin (NEURONTIN) 100 MG capsule Take 1 capsule (100 mg total) by mouth 3 (three) times daily. Patient not taking:  Reported on 04/26/2014 07/13/13   Benjamine Mola, FNP  hydrOXYzine (ATARAX/VISTARIL) 25 MG tablet Take 1 tablet (25 mg total) by mouth every 6 (six) hours as needed for anxiety. Patient not taking: Reported on 04/26/2014 07/13/13   Benjamine Mola, FNP  methocarbamol (ROBAXIN) 500 MG tablet Take 1 tablet (500 mg total) by mouth every 8 (eight) hours as needed for muscle spasms. Patient not taking: Reported on 04/26/2014 07/13/13   Benjamine Mola, FNP  OxyCODONE (OXYCONTIN) 80 mg T12A 12 hr tablet Take 1 tablet (80 mg total) by mouth every 12 (twelve) hours. Patient taking differently: Take 80 mg by mouth every 8 (eight) hours.  07/13/13   Benjamine Mola, FNP  pantoprazole (PROTONIX) 40 MG tablet Take 1 tablet  (40 mg total) by mouth daily. Patient not taking: Reported on 04/26/2014 07/13/13   Benjamine Mola, FNP  polyethylene glycol (MIRALAX / Floria Raveling) packet Take 17 g by mouth 2 (two) times daily as needed (constipation).     Historical Provider, MD  promethazine (PHENERGAN) 25 MG suppository Place 1 suppository (25 mg total) rectally every 6 (six) hours as needed for nausea or vomiting. Patient not taking: Reported on 04/26/2014 07/06/13   Evelina Bucy, MD  risperiDONE (RISPERDAL M-TABS) 1 MG disintegrating tablet Take 1 tablet (1 mg total) by mouth at bedtime. Patient not taking: Reported on 04/26/2014 07/13/13   Benjamine Mola, FNP  traZODone (DESYREL) 25 mg TABS tablet Take 0.5 tablets (25 mg total) by mouth at bedtime as needed for sleep. Patient not taking: Reported on 04/26/2014 07/13/13   Benjamine Mola, FNP   BP 116/86 mmHg  Pulse 130  Temp(Src) 97.7 F (36.5 C) (Oral)  Resp 20  SpO2 100%  LMP 08/10/2011 Physical Exam  Constitutional: She is oriented to person, place, and time. She appears well-developed and well-nourished. No distress.  Initially in a hallway bed. Sitting up eating a [place] sandwich and appeared comfortable. When I introduced myself though her demeanor changed and she then began crying.   HENT:  Head: Normocephalic and atraumatic.  Eyes: Conjunctivae are normal. Right eye exhibits no discharge. Left eye exhibits no discharge.  Neck: Neck supple.  Cardiovascular: Normal rate, regular rhythm and normal heart sounds.  Exam reveals no gallop and no friction rub.   No murmur heard. Pulmonary/Chest: Effort normal and breath sounds normal. No respiratory distress.  Abdominal: Soft. She exhibits no distension. There is no tenderness.  Musculoskeletal: She exhibits no edema or tenderness.  Neurological: She is alert and oriented to person, place, and time. No cranial nerve deficit. She exhibits normal muscle tone. Coordination normal.  Strength 5/5 b/l LE. Sensation intact to light  touch. Steady appearing gait.   Skin: Skin is warm and dry.  Psychiatric: She has a normal mood and affect. Her behavior is normal. Thought content normal.  Speech clear. Content appropriate. Doesn't appear to be responding to internal stimuli.   Nursing note and vitals reviewed.   ED Course  Procedures (including critical care time) Labs Review Labs Reviewed  ACETAMINOPHEN LEVEL - Abnormal; Notable for the following:    Acetaminophen (Tylenol), Serum <10.0 (*)    All other components within normal limits  COMPREHENSIVE METABOLIC PANEL - Abnormal; Notable for the following:    Potassium 3.4 (*)    Glucose, Bld 120 (*)    Total Bilirubin 0.1 (*)    All other components within normal limits  URINE RAPID DRUG SCREEN (HOSP PERFORMED) - Abnormal; Notable for the  following:    Opiates POSITIVE (*)    All other components within normal limits  CBC  ETHANOL  SALICYLATE LEVEL    Imaging Review No results found.   EKG Interpretation   Date/Time:  Wednesday April 26 2014 14:17:22 EDT Ventricular Rate:  133 PR Interval:  136 QRS Duration: 88 QT Interval:  308 QTC Calculation: 458 R Axis:   -20 Text Interpretation:  Sinus tachycardia Borderline left axis deviation Low  voltage, precordial leads Consider anterior infarct Baseline wander in  lead(s) V4 V5 No significant change since last tracing Confirmed by [facility]   MD, MARTHA [phone]) on 04/26/2014 2:21:21 PM      MDM   Final diagnoses:  Chronic back pain  Drug dependence  Sinus tachycardia    33yF with chronic back pain. Says pharmacy out of opana and requesting prescription. Explained that I do not provide prescriptions for controlled medications to treat chronic pain. Pt then stating "Well I might as well kill myself then." Told that if this is really how she feels then I can have TTS speak with her. She does not want to. Noted to be very tachycardic. Sinus tach. ED visits in January and February with HR of 116 and 104.  Withdrawal? Afebrile. Normotensive. She does not appear toxic. Normal oxygen saturations. Screening labs already drawn. Pt does not want to wait for these to be resulted.  Understands that resting tachycardia to this degree warrants further work-up. She has medical decision capability. Discharged per her wishes.   Pt now stating she does want to speak with behavioral health. HR coming down. Labs thus far fairly unremarkable. Will place on clonidine protocol. TTS eval.     Virgel Manifold, MD 04/26/14 2342

## 2014-04-26 NOTE — ED Notes (Signed)
Pt sleeping at present, no distress noted, respirations even and unlabored, monitoring for safety, Q15 min checks in effect.

## 2014-04-26 NOTE — BH Assessment (Signed)
Assessment Note  [name] [name] is an 34 y.o. female presenting to [name] via bus asking for pain medications. Patent sts that she has legitimate back pain issues. Patient went to pick up her pain medications from the pharmacy [facility]). Per pt, states pharmacy out of pain med and wont get them till Friday. Patient evaluated at [facility] by EDP-Dr. Wilson Singer and he has refused to provide pain medications. Patient sts that she wants to die because the EDP will not give her pain pills for her back pain. Patient has a plan to OD. She has overdosed 1x in the past . Patient on the main side of the Emergency Room during the assessment. Patient asking which psychiatrist are present in [facility] hoping they will provide pain medications. Writer informed patient that the psychiatrist was gone for the day. Patient then sts that she didn't want to go [facility] if psychiatrist wouldn't prescribe her pain pills. Patient stating she was no longer suicidal but wants to go to inpatient treatment at [facility]. Patient hoping that a psychiatrist at [facility] provide her with pain pills stating, "They gave them to me last time.Marland KitchenMarland KitchenMarland KitchenSo I should get them again". Patient reports increased depression and anxiety. Appetite and sleep are fair. Patents anxiety has increased. She has a family hx of Bipolar Disorder (mother). He has no hx of sexual abuse.   Axis I: Major Depression, Recurrent severe, without psychotic features and Anxiety Disorder NOS Axis II: Deferred Axis III:  Past Medical History  Diagnosis Date  . Migraine   . GERD (gastroesophageal reflux disease)     protonix evenings  . Endometriosis     chronic pain  . Anxiety     klonipin for stress disorder  . Degenerative disc disease   . Chronic back pain    Axis IV: other psychosocial or environmental problems, problems related to social environment, problems with access to health care services and problems with primary support group Axis V: 31-40 impairment in reality testing  Past  Medical History:  Past Medical History  Diagnosis Date  . Migraine   . GERD (gastroesophageal reflux disease)     protonix evenings  . Endometriosis     chronic pain  . Anxiety     klonipin for stress disorder  . Degenerative disc disease   . Chronic back pain     Past Surgical History  Procedure Laterality Date  . Appendectomy    . Tonsillectomy    . Tubal ligation    . Laparoscopic assisted vaginal hysterectomy  08/19/2011    Procedure: LAPAROSCOPIC ASSISTED VAGINAL HYSTERECTOMY;  Surgeon: Gus Height, MD;  Location: [facility];  Service: Gynecology;  Laterality: N/A;  . Abdominal hysterectomy      Family History:  Family History  Problem Relation Age of Onset  . Coronary artery disease Father   . Coronary artery disease Maternal Grandmother   . Breast cancer Mother   . Cancer Mother     Social History:  reports that she has been smoking Cigarettes.  She has a 14 pack-year smoking history. She does not have any smokeless tobacco history on file. She reports that she does not drink alcohol or use illicit drugs.  Additional Social History:  Alcohol / Drug Use Pain Medications: SEE MAR Prescriptions: SEE MAR Over the Counter: SEE MAR History of alcohol / drug use?: No history of alcohol / drug abuse  CIWA: CIWA-Ar BP: 119/78 mmHg Pulse Rate: 113 COWS:    Allergies:  Allergies  Allergen Reactions  . Epidural  Tray 17gx3-1-2" [Nerve Block Tray] Other (See Comments)    Paralysis and severe pain in head/neck/shoulders.  No anaphylaxis.  . Ibuprofen Hives  . Penicillins Hives  . Zofran [Ondansetron Hcl] Nausea And Vomiting    Home Medications:  (Not in a hospital admission)  OB/GYN Status:  Patient's last menstrual period was 08/10/2011.  General Assessment Data Location of Assessment: [facility] ED Is this a Tele or Face-to-Face Assessment?: Face-to-Face Is this an Initial Assessment or a Re-assessment for this encounter?: Initial Assessment Living Arrangements:  Spouse/significant other, Children, Other relatives Can pt return to current living arrangement?: No Admission Status: Voluntary Is patient capable of signing voluntary admission?: Yes Transfer from: [facility] Referral Source: Self/Family/Friend     [place] Living Arrangements: Spouse/significant other, Children, Other relatives Name of Psychiatrist:  (No psychiatrist ) Name of Therapist:  (No therapist )  Education Status Is patient currently in school?: No  Risk to self with the past 6 months Suicidal Ideation: Yes-Currently Present Suicidal Intent: Yes-Currently Present Is patient at risk for suicide?: Yes Suicidal Plan?: Yes-Currently Present Specify Current Suicidal Plan:  (OD on sleeping pill ) Access to Means:  (sleeping pills ) What has been your use of drugs/alcohol within the last 12 months?:  (patient reports RX opiate use only ) Previous Attempts/Gestures: Yes How many times?:  (1 prior attempt-OD on sleeping pills) Other Self Harm Risks:  (patient denies ) Triggers for Past Attempts: Unknown (patient denies ) Intentional Self Injurious Behavior: None Family Suicide History:  (mother-Bipolar Disorder) Recent stressful life event(s): Other (Comment) ("My pharmacy ran out of pain medications"; chronic pain ) Persecutory voices/beliefs?: No Depression: Yes Depression Symptoms: Feeling angry/irritable, Loss of interest in usual pleasures, Feeling worthless/self pity, Guilt, Fatigue, Isolating, Tearfulness, Insomnia, Despondent Substance abuse history and/or treatment for substance abuse?: No Suicide prevention information given to non-admitted patients: Not applicable  Risk to Others within the past 6 months Homicidal Ideation: No Thoughts of Harm to Others: No Current Homicidal Intent: No Current Homicidal Plan: No Access to Homicidal Means: No Identified Victim:  (n/a) History of harm to others?: No Assessment of Violence: None Noted Violent  Behavior Description:  (patient is calm and cooperative ) Does patient have access to weapons?: No Criminal Charges Pending?: No Does patient have a court date: No  Psychosis Hallucinations: None noted Delusions: None noted  Mental Status Report Appearance/Hygiene: Disheveled Eye Contact: Fair Motor Activity: Agitation Speech: Logical/coherent Level of Consciousness: Alert Mood: Depressed Affect: Appropriate to circumstance Anxiety Level: Severe Thought Processes: Coherent Judgement: Impaired Orientation: Person, Place, Time, Situation Obsessive Compulsive Thoughts/Behaviors: None     ADLScreening ([facility]) Patient's cognitive ability adequate to safely complete daily activities?: Yes Patient able to express need for assistance with ADLs?: Yes Independently performs ADLs?: Yes (appropriate for developmental age)  Prior Inpatient Therapy Prior Inpatient Therapy: Yes Prior Therapy Dates:  (12/27/11;06/07/09;07/07/13) Prior Therapy Facilty/Provider(s):  [facility]) Reason for Treatment:  (depression, suicidal, anxiety)  Prior Outpatient Therapy Prior Outpatient Therapy: No Prior Therapy Dates:  (n/a; past hx of therapy but unable to recall name of provide) Prior Therapy Facilty/Provider(s):  (n/a) Reason for Treatment:  (n/a)  ADL Screening (condition at time of admission) Patient's cognitive ability adequate to safely complete daily activities?: Yes Is the patient deaf or have difficulty hearing?: No Does the patient have difficulty seeing, even when wearing glasses/contacts?: No Does the patient have difficulty concentrating, remembering, or making decisions?: Yes Patient able to express need for assistance with ADLs?: Yes  Does the patient have difficulty dressing or bathing?: No Independently performs ADLs?: Yes (appropriate for developmental age) Does the patient have difficulty walking or climbing stairs?: No Weakness of Legs: None Weakness of Arms/Hands:  None  Home Assistive Devices/Equipment Home Assistive Devices/Equipment: None    Abuse/Neglect Assessment (Assessment to be complete while patient is alone) Physical Abuse: Denies Verbal Abuse: Denies Sexual Abuse: Denies Exploitation of patient/patient's resources: Denies Self-Neglect: Denies Values / Beliefs Cultural Requests During Hospitalization: None Spiritual Requests During Hospitalization: None   Advance Directives (For Healthcare) Does patient have an advance directive?: No Would patient like information on creating an advanced directive?: No - patient declined information    Additional Information 1:1 In Past 12 Months?: No CIRT Risk: No Elopement Risk: No Does patient have medical clearance?: Yes     Disposition:  Disposition Initial Assessment Completed for this Encounter: Yes Disposition of Patient: Other dispositions (Patient to remain in the ED overnight) Other disposition(s): Other (Comment) (Psychiatry to re-evaluate in the am)  On Site Evaluation by:   Reviewed with Physician:    Waldon Merl [facility] 04/26/2014 5:03 PM

## 2014-04-26 NOTE — ED Notes (Signed)
TTS at bedside. Pt to go to room 37 when evaluation completed.

## 2014-04-26 NOTE — ED Notes (Addendum)
Patient transported from ED to room 37.  Patient has been requesting oxycotin and klonopin.  Patient was informed that she would not receive narcotics.  She was given medications per protocol for withdrawal.

## 2014-04-26 NOTE — ED Notes (Signed)
Pt resting on stretcher, screaming out loud, requesting pain meds, remains SI wants to die, AV hallucinations-seeing dead people, pt reports. AAO x 3, no acute distress, monitoring for safety, Q 15 min checks in effect.

## 2014-04-26 NOTE — Discharge Instructions (Signed)
Back Pain, Adult °Low back pain is very common. About 1 in 5 people have back pain. The cause of low back pain is rarely dangerous. The pain often gets better over time. About half of people with a sudden onset of back pain feel better in just 2 weeks. About 8 in 10 people feel better by 6 weeks.  °CAUSES °Some common causes of back pain include: °· Strain of the muscles or ligaments supporting the spine. °· Wear and tear (degeneration) of the spinal discs. °· Arthritis. °· Direct injury to the back. °DIAGNOSIS °Most of the time, the direct cause of low back pain is not known. However, back pain can be treated effectively even when the exact cause of the pain is unknown. Answering your caregiver's questions about your overall health and symptoms is one of the most accurate ways to make sure the cause of your pain is not dangerous. If your caregiver needs more information, he or she may order lab work or imaging tests (X-rays or MRIs). However, even if imaging tests show changes in your back, this usually does not require surgery. °HOME CARE INSTRUCTIONS °For many people, back pain returns. Since low back pain is rarely dangerous, it is often a condition that people can learn to manage on their own.  °· Remain active. It is stressful on the back to sit or stand in one place. Do not sit, drive, or stand in one place for more than 30 minutes at a time. Take short walks on level surfaces as soon as pain allows. Try to increase the length of time you walk each day. °· Do not stay in bed. Resting more than 1 or 2 days can delay your recovery. °· Do not avoid exercise or work. Your body is made to move. It is not dangerous to be active, even though your back may hurt. Your back will likely heal faster if you return to being active before your pain is gone. °· Pay attention to your body when you  bend and lift. Many people have less discomfort when lifting if they bend their knees, keep the load close to their bodies, and  avoid twisting. Often, the most comfortable positions are those that put less stress on your recovering back. °· Find a comfortable position to sleep. Use a firm mattress and lie on your side with your knees slightly bent. If you lie on your back, put a pillow under your knees. °· Only take over-the-counter or prescription medicines as directed by your caregiver. Over-the-counter medicines to reduce pain and inflammation are often the most helpful. Your caregiver may prescribe muscle relaxant drugs. These medicines help dull your pain so you can more quickly return to your normal activities and healthy exercise. °· Put ice on the injured area. °¨ Put ice in a plastic bag. °¨ Place a towel between your skin and the bag. °¨ Leave the ice on for 15-20 minutes, 03-04 times a day for the first 2 to 3 days. After that, ice and heat may be alternated to reduce pain and spasms. °· Ask your caregiver about trying back exercises and gentle massage. This may be of some benefit. °· Avoid feeling anxious or stressed. Stress increases muscle tension and can worsen back pain. It is important to recognize when you are anxious or stressed and learn ways to manage it. Exercise is a great option. °SEEK MEDICAL CARE IF: °· You have pain that is not relieved with rest or medicine. °· You have pain that does not improve in 1 week. °· You have new symptoms. °· You are generally not feeling well. °SEEK   IMMEDIATE MEDICAL CARE IF:  °· You have pain that radiates from your back into your legs. °· You develop new bowel or bladder control problems. °· You have unusual weakness or numbness in your arms or legs. °· You develop nausea or vomiting. °· You develop abdominal pain. °· You feel faint. °Document Released: 01/06/2005 Document Revised: 07/08/2011 Document Reviewed: 05/10/2013 °ExitCare® Patient Information ©2015 [employer]. This information is not intended to replace advice given to you by your health care provider. Make sure you  discuss any questions you have with your health care provider. ° ° ° ° °Emergency Department Resource Guide °1) Find a Doctor and Pay Out of Pocket °Although you won't have to find out who is covered by your insurance plan, it is a good idea to ask around and get recommendations. You will then need to call the office and see if the doctor you have chosen will accept you as a new patient and what types of options they offer for patients who are self-pay. Some doctors offer discounts or will set up payment plans for their patients who do not have insurance, but you will need to ask so you aren't surprised when you get to your appointment. ° °2) Contact Your Local Health Department °Not all health departments have doctors that can see patients for sick visits, but many do, so it is worth a call to see if yours does. If you don't know where your local health department is, you can check in your phone book. The CDC also has a tool to help you locate your state's health department, and many state websites also have listings of all of their local health departments. ° °3) Find a Walk-in Clinic °If your illness is not likely to be very severe or complicated, you may want to try a walk in clinic. These are popping up all over the country in pharmacies, drugstores, and shopping centers. They're usually staffed by nurse practitioners or physician assistants that have been trained to treat common illnesses and complaints. They're usually fairly quick and inexpensive. However, if you have serious medical issues or chronic medical problems, these are probably not your best option. ° °No Primary Care Doctor: °- Call [facility] at  832-8000 - they can help you locate a primary care doctor that  accepts your insurance, provides certain services, etc. °- Physician Referral Service- 1-800-533-3463 ° °Chronic Pain Problems: °Organization         Address  Phone   Notes  °[facility]  (336) 297-2271 Patients need  to be referred by their primary care doctor.  ° °Medication Assistance: °Organization         Address  Phone   Notes  °[place] [facility] [address] °[place], [place] [address] (336) 641-8030 --Must be a resident of [place] °-- Must have NO insurance coverage whatsoever (no Medicaid/ Medicare, etc.) °-- The pt. MUST have a primary care doctor that directs their care regularly and follows them in the community °  °[facility]  (866) 331-1348   °[employer]  (888) 892-1162   ° °Agencies that provide inexpensive medical care: °Organization         Address  Phone   Notes  °[facility]  (336) 832-8035   °[facility]    (336) 832-7272   °[facility] [address] °[place], [place] [address] (336) 832-4777   °[facility] [address], °[place] (336) 271-4999   °[facility]    (336) 373-0678   °[facility]    (336) 272-1050   °[facility] ° [address], [place] Phone:  (336) 832-4444, Fax:  (336) 832-4440 Hours of Operation:  9 am - 6 pm, M-F.  Also accepts Medicaid/Medicare and self-pay.  °[facility] ° [address], [place] Phone: (336) 832-3150, Fax: (336) [phone]. Hours of Operation:  8:30 am - 5:30 pm, M-F.  Also accepts Medicaid and self-pay.  °[facility] [place] [address], [place] Phone: (336) 878-6027   °[facility] [address], [place], [place] (336)723-1848, Ext. 123 Mondays & Thursdays: 7-9 AM.  First 15 patients are seen on a first come, first serve basis. °  ° °Medicaid-accepting [place] Providers: ° °Organization         Address  Phone   Notes  °[facility] [address], [place] (336) 641-2100 Also accepts self-pay patients.  °[facility] [address], [place] ° (336) 856-9996   °[facility] [address], [place] (336) 288-8857   °[facility] [address] [place] [address], [place] (336) 299-7000   °Veita Bland [address], [place]  ° (336) 373-1557 Only accepts [place] Access Medicaid patients after they have their name applied to their card.  ° °Self-Pay (no insurance) in [place]: ° °Organization         Address  Phone   Notes  °Sickle Cell Patients, [facility] [address], [place] (336) 832-1970   °[facility] [address], [place] (336) 832-4400   °[facility] ° [address], [place] (336) 992-4800   °[facility]/Dr. Osei-Bonsu ° [address] [place] [address], [place] or [address], [place] (336) 841-8500 Phone number for both [place] and [place] locations is the same.  °[facility] [address], [place] (336) 299-0000   °[facility] [address] [place] [address], [place] or [address] (336) 852-7530 °(336) 878-2260   °[facility] [address],  (336) 350-1642, phone; (336) 294-5005, fax Sees patients 1st and 3rd Saturday of every month.  Must not qualify for public or private insurance (i.e. Medicaid, Medicare, [place] Health Choice, Veterans' Benefits) • Household income should be no more than 200% of the poverty level •The clinic cannot treat you if you are pregnant or think you are pregnant • Sexually transmitted diseases are not treated at the clinic.  ° ° °Dental Care: °Organization         Address  Phone  Notes  °[facility] [address],  (336) 641-6152 Accepts children up to age 21 who are enrolled in Medicaid or [place] Health Choice; pregnant women with a Medicaid card; and children who have applied for Medicaid or [place] Health Choice, but were declined, whose  parents can pay a reduced fee at time of service.  °[facility] [place]  [address], [place] (336) 641-7733 Accepts children up to age 21 who are enrolled in Medicaid or  Health Choice; pregnant women with a Medicaid card; and children who have applied for Medicaid   or [place] Health Choice, but were declined, whose parents can pay a reduced fee at time of service.  °[facility] ° [address], [place] (336) 641-4533 Patients are seen by appointment only. Walk-ins are not accepted. [facility] will see patients 18 years of age and older. °Monday - Tuesday (8am-5pm) °Most Wednesdays (8:30-5pm) °$30 per visit, cash only  °[facility] ° [address], [place] (336) 641-4533 Patients are seen by appointment only. Walk-ins are not accepted. [facility] will see patients 18 years of age and older. °One Wednesday Evening (Monthly: Volunteer Based).  $30 per visit, cash only  °[facility]  (919) 537-3737 for adults; Children under age 4, call Graduate Pediatric Dentistry at ([phone]) [phone]. Children aged 4-14, please call (919) [phone] to request a pediatric application. ° Dental services are provided in all areas of dental care including fillings, crowns and bridges, complete and partial dentures, implants, gum treatment, root canals, and extractions. Preventive care is also provided. Treatment is provided to both adults and children. °Patients are selected via a lottery and there is often a waiting list. °  °[facility] [address], °[place] ° (336) 763-8833 [email or web address] °  °[facility] [address], [place], [place] (336)723-1848, Ext. 123 Second and Fourth Thursday of each month, opens at 6:30 AM; Clinic ends at 9 AM.  Patients are seen on a first-come first-served basis, and a limited number are seen during each clinic.  ° °[facility] °  [address], [place], [place] (336) 723-7904   Eligibility Requirements °You must have lived in [place], [place], or [place] counties for at least the last three months. °  You cannot be eligible for state or federal sponsored healthcare insurance, including Veterans Administration, Medicaid, or Medicare. °  You generally cannot be eligible for healthcare insurance through your employer.  °  How to apply: °Eligibility screenings are held every Tuesday and Wednesday afternoon from 1:00 pm until 4:00 pm. You do not need an appointment for the interview!  °[facility] [address], [place], [place] 336-631-2330   °[facility]  336-342-8273   °[facility]  336-703-3100   °[facility]  336-570-6415   ° °Behavioral Health Resources in the Community: °Intensive Outpatient Programs °Organization         Address  Phone  Notes  °[facility] [address], [place], [place] 336-878-6098   °[facility] [address], [place], [place] 336-832-9800   °[facility] [address], [place], [place] ° 336-882-2125   °[facility] [address],  °[place], [place] 1-800-853-5163 or 336-641-4981   °Substance Abuse Resources °Organization         Address  Phone  Notes  °[facility]  336-882-2125   °[facility]  336-784-9470   °[facility]  336-285-9073   °[facility]  336-845-3988   °Residential & Outpatient Substance Abuse Program  1-800-659-3381   °Psychological Services °Organization         Address  Phone  Notes  °[facility]  336- 832-9600   °[facility]  336- 378-7881   °[facility] [address], [place] 1-800-853-5163 or 336-641-4981   ° °Mobile Crisis Teams °Organization         Address  Phone    Notes  °[facility], Mobile Crisis Care Unit  1-877-626-1772     °Assertive °[facility] ° [address] [place], [place] 336-834-9664   °Sharon DeEsch [address] °[place] 336-554-5454   ° °Self-Help/Support Groups °Organization         Address  Phone             Notes  °[facility] - variety of support groups  336- 373-1402 Call for more information  °Narcotics Anonymous (NA), [facility] [address], °[place]  2 meetings at this location  ° °Residential Treatment Programs °Organization         Address  Phone  Notes  °[facility] [address],    °[place]  1-866-801-8205   °[facility] ° [address], [place], [place] 704-293-8524   °[facility] [address], [place] 336-845-3988 Admissions: 8am-3pm M-F  °[facility] [address]    °[place], [place] 336-841-1104   °[facility] [address], [place], [place] 336-379-7146   °[facility] [address]  °[place], [place] 336-285-9073   °[facility] Intensive Outpatient [address], [place], [place] 336-852-3033   °[facility] [address]  °[place], [place] 1-877-615-2722 or 336-784-9470   °[facility] [address] [place], [place] 336-227-7417 Accepts Medicaid  °[facility] [address]  °[place] 1-800-659-3381 Substance Abuse/Addiction Treatment  ° °[place] Behavioral Health Resources °Organization         Address  Phone  Notes  °[facility]  (888) 581-9988   °Julie Brannon, PhD [address] [place], [place]   (336) 349-5553 or (336) 951-0000   °[facility]   [address] °[place], [place] (336) 349-4454   °[facility] [address], [place], [place] (336) 342-8316 Insurance/Medicaid/sponsorship through [facility]  °[facility] [address]                                     [place], [place] (336) 342-8316 Therapy/tele-psych/case  °[facility] [address]  ° [place], [place] (336) 349-2233    °Dr. Arfeen  (336) 349-4544   °[facility] [place]  [employer] [place] [facility] 1) [address],  °2) [address], [place] °3)  [address], [place] (336) 349-3220 °(336) 342-7768 ° °(336) 342-8140   °[place] [facility] (336) 342-1394 or (336) 342-3537 (After Hours)    ° ° ° ° °

## 2014-04-26 NOTE — ED Notes (Signed)
Per pt, states pharmacy out of pain meds-wont get them till Friday-wants to die because of chronic back-med seeking

## 2014-04-26 NOTE — ED Notes (Signed)
Pt states she initially refused TTS consult and told the doctor she wanted to go, but pt now states she changed her mind and would like to speak to psychiatrist. Dr Wilson Singer aware.

## 2014-04-27 DIAGNOSIS — F332 Major depressive disorder, recurrent severe without psychotic features: Secondary | ICD-10-CM

## 2014-04-27 DIAGNOSIS — F1920 Other psychoactive substance dependence, uncomplicated: Secondary | ICD-10-CM | POA: Insufficient documentation

## 2014-04-27 MED ORDER — ZOLPIDEM TARTRATE 5 MG PO TABS
5.0000 mg | ORAL_TABLET | Freq: Every evening | ORAL | Status: DC | PRN
  Administered 2014-04-27: 5 mg via ORAL
  Filled 2014-04-27: qty 1

## 2014-04-27 MED ORDER — DIPHENHYDRAMINE HCL 25 MG PO CAPS
50.0000 mg | ORAL_CAPSULE | Freq: Once | ORAL | Status: AC
  Administered 2014-04-27: 50 mg via ORAL
  Filled 2014-04-27: qty 2

## 2014-04-27 NOTE — Consult Note (Signed)
[facility] Face-to-Face Psychiatry Consult   Reason for Consult:  Major depressive disorder, recurrent, sever Referring Physician:  EDP Patient Identification: [name] [name] MRN:  [id number] Principal Diagnosis: MDD (major depressive disorder) Diagnosis:   Patient Active Problem List   Diagnosis Date Noted  . MDD (major depressive disorder) [F32.2] 07/07/2013    Priority: High  . Polysubstance (including opioids) dependence with physiological dependence [F19.20] 07/08/2013  . Suicidal ideation [R45.851] 07/06/2013  . Nausea & vomiting [R11.2] 07/06/2013  . Chronic pain disorder [G89.4] 07/06/2013  . Cocaine abuse [F14.10] 07/06/2013  . Major depression [F32.2] 01/05/2012  . Alcohol dependence [F10.20] 01/04/2012  . Opioid dependence [F11.20] 12/29/2011  . Back pain [M54.9] 12/29/2011  . N&V (nausea and vomiting) [R11.2] 06/21/2011  . Tachycardia [R00.0] 06/21/2011  . Abdominal pain [R10.9] 06/21/2011  . Leukocytosis [D72.829] 06/21/2011    Total Time spent with patient: 1 hour  Subjective:   [name] [name] is a 34 y.o. female patient admitted with  Major.depression, recurrent  HPI:  Caucasian female, 34 years old was evaluated for voicing suicidal ideations yesterday when she was denied Opioid Pain medications.  She signed her self out yesterday from the ER when she was informed by the EDP that she was not going to receive Opioid pain medication.  She left and came back and then voiced suicide because she could not receive pain medication.   Patient today stated " I had a rough day yesterday, I miss spoke, I don't want to kill myself "  Today she is alert and oriented x3.  She stated that she is dealing with pain and that she has a prescription for pain medications but could not explain the reasons why she cannot fill her prescription at the right Pharmacy.  She reported chronic lower back pain and stated that she has a Neurology appointment next week.  Patient denies SI/HI/AVH but reported  previous attempt to OD couple of years ago.  Patient  Live with her husband and kids.   She is discharged now.  HPI Elements:   Location:  Major depression, recurrent, severe. Quality:  Moderate. Severity:  Moderate. Timing:  Acute. Duration:  Chronic medical illness. Context:  Seeking treatment for Chronic pain.  Past Medical History:  Past Medical History  Diagnosis Date  . Migraine   . GERD (gastroesophageal reflux disease)     protonix evenings  . Endometriosis     chronic pain  . Anxiety     klonipin for stress disorder  . Degenerative disc disease   . Chronic back pain     Past Surgical History  Procedure Laterality Date  . Appendectomy    . Tonsillectomy    . Tubal ligation    . Laparoscopic assisted vaginal hysterectomy  08/19/2011    Procedure: LAPAROSCOPIC ASSISTED VAGINAL HYSTERECTOMY;  Surgeon: Gus Height, MD;  Location: [facility];  Service: Gynecology;  Laterality: N/A;  . Abdominal hysterectomy     Family History:  Family History  Problem Relation Age of Onset  . Coronary artery disease Father   . Coronary artery disease Maternal Grandmother   . Breast cancer Mother   . Cancer Mother    Social History:  History  Alcohol Use No     History  Drug Use No    Comment: denies    History   Social History  . Marital Status: Married    Spouse Name: N/A  . Number of Children: N/A  . Years of Education: N/A   Social History Main Topics  .  Smoking status: Current Every Day Smoker -- 1.00 packs/day for 14 years    Types: Cigarettes  . Smokeless tobacco: Not on file  . Alcohol Use: No  . Drug Use: No     Comment: denies  . Sexual Activity: Yes   Other Topics Concern  . None   Social History Narrative   Additional Social History:    Pain Medications: SEE MAR Prescriptions: SEE MAR Over the Counter: SEE MAR History of alcohol / drug use?: No history of alcohol / drug abuse                     Allergies:   Allergies  Allergen Reactions   . Epidural Tray 17gx3-1-2" [Nerve Block Tray] Other (See Comments)    Paralysis and severe pain in head/neck/shoulders.  No anaphylaxis.  . Ibuprofen Hives  . Penicillins Hives  . Zofran [Ondansetron Hcl] Nausea And Vomiting    Labs:  Results for orders placed or performed during the hospital encounter of 04/26/14 (from the past 48 hour(s))  Urine Drug Screen     Status: Abnormal   Collection Time: 04/26/14  2:52 PM  Result Value Ref Range   Opiates POSITIVE (A) NONE DETECTED   Cocaine NONE DETECTED NONE DETECTED   Benzodiazepines NONE DETECTED NONE DETECTED   Amphetamines NONE DETECTED NONE DETECTED   Tetrahydrocannabinol NONE DETECTED NONE DETECTED   Barbiturates NONE DETECTED NONE DETECTED    Comment:        DRUG SCREEN FOR MEDICAL PURPOSES ONLY.  IF CONFIRMATION IS NEEDED FOR ANY PURPOSE, NOTIFY LAB WITHIN 5 DAYS.        LOWEST DETECTABLE LIMITS FOR URINE DRUG SCREEN Drug Class       Cutoff (ng/mL) Amphetamine      1000 Barbiturate      200 Benzodiazepine   496 Tricyclics       759 Opiates          300 Cocaine          300 THC              50   Acetaminophen level     Status: Abnormal   Collection Time: 04/26/14  3:02 PM  Result Value Ref Range   Acetaminophen (Tylenol), Serum <10.0 (L) 10 - 30 ug/mL    Comment:        THERAPEUTIC CONCENTRATIONS VARY SIGNIFICANTLY. A RANGE OF 10-30 ug/mL MAY BE AN EFFECTIVE CONCENTRATION FOR MANY PATIENTS. HOWEVER, SOME ARE BEST TREATED AT CONCENTRATIONS OUTSIDE THIS RANGE. ACETAMINOPHEN CONCENTRATIONS >150 ug/mL AT 4 HOURS AFTER INGESTION AND >50 ug/mL AT 12 HOURS AFTER INGESTION ARE OFTEN ASSOCIATED WITH TOXIC REACTIONS.   CBC     Status: None   Collection Time: 04/26/14  3:02 PM  Result Value Ref Range   WBC 5.5 4.0 - 10.5 K/uL   RBC 4.45 3.87 - 5.11 MIL/uL   Hemoglobin 12.7 12.0 - 15.0 g/dL   HCT 38.5 36.0 - 46.0 %   MCV 86.5 78.0 - 100.0 fL   MCH 28.5 26.0 - 34.0 pg   MCHC 33.0 30.0 - 36.0 g/dL   RDW 13.2  11.5 - 15.5 %   Platelets 306 150 - 400 K/uL  Comprehensive metabolic panel     Status: Abnormal   Collection Time: 04/26/14  3:02 PM  Result Value Ref Range   Sodium 140 135 - 145 mmol/L   Potassium 3.4 (L) 3.5 - 5.1 mmol/L   Chloride 107 96 - 112 mmol/L  CO2 23 19 - 32 mmol/L   Glucose, Bld 120 (H) 70 - 99 mg/dL   BUN 9 6 - 23 mg/dL   Creatinine, Ser 0.65 0.50 - 1.10 mg/dL   Calcium 9.3 8.4 - 10.5 mg/dL   Total Protein 8.2 6.0 - 8.3 g/dL   Albumin 4.0 3.5 - 5.2 g/dL   AST 19 0 - 37 U/L   ALT 17 0 - 35 U/L   Alkaline Phosphatase 95 39 - 117 U/L   Total Bilirubin 0.1 (L) 0.3 - 1.2 mg/dL   GFR calc non Af Amer >90 >90 mL/min   GFR calc Af Amer >90 >90 mL/min    Comment: (NOTE) The eGFR has been calculated using the CKD EPI equation. This calculation has not been validated in all clinical situations. eGFR's persistently <90 mL/min signify possible Chronic Kidney Disease.    Anion gap 10 5 - 15  Ethanol (ETOH)     Status: None   Collection Time: 04/26/14  3:02 PM  Result Value Ref Range   Alcohol, Ethyl (B) <5 0 - 9 mg/dL    Comment:        LOWEST DETECTABLE LIMIT FOR SERUM ALCOHOL IS 11 mg/dL FOR MEDICAL PURPOSES ONLY   Salicylate level     Status: None   Collection Time: 04/26/14  3:02 PM  Result Value Ref Range   Salicylate Lvl <4.6 2.8 - 20.0 mg/dL    Vitals: Blood pressure 111/71, pulse 98, temperature 98.2 F (36.8 C), temperature source Oral, resp. rate 18, last menstrual period 08/10/2011, SpO2 98 %.  Risk to Self: Suicidal Ideation: Yes-Currently Present Suicidal Intent: Yes-Currently Present Is patient at risk for suicide?: Yes Suicidal Plan?: Yes-Currently Present Specify Current Suicidal Plan:  (OD on sleeping pill ) Access to Means:  (sleeping pills ) What has been your use of drugs/alcohol within the last 12 months?:  (patient reports RX opiate use only ) How many times?:  (1 prior attempt-OD on sleeping pills) Other Self Harm Risks:  (patient denies  ) Triggers for Past Attempts: Unknown (patient denies ) Intentional Self Injurious Behavior: None Risk to Others: Homicidal Ideation: No Thoughts of Harm to Others: No Current Homicidal Intent: No Current Homicidal Plan: No Access to Homicidal Means: No Identified Victim:  (n/a) History of harm to others?: No Assessment of Violence: None Noted Violent Behavior Description:  (patient is calm and cooperative ) Does patient have access to weapons?: No Criminal Charges Pending?: No Does patient have a court date: No Prior Inpatient Therapy: Prior Inpatient Therapy: Yes Prior Therapy Dates:  (12/27/11;06/07/09;07/07/13) Prior Therapy Facilty/Provider(s):  [facility]) Reason for Treatment:  (depression, suicidal, anxiety) Prior Outpatient Therapy: Prior Outpatient Therapy: No Prior Therapy Dates:  (n/a; past hx of therapy but unable to recall name of provide) Prior Therapy Facilty/Provider(s):  (n/a) Reason for Treatment:  (n/a)  Current Facility-Administered Medications  Medication Dose Route Frequency Provider Last Rate Last Dose  . acetaminophen (TYLENOL) tablet 500 mg  500 mg Oral Q6H PRN Virgel Manifold, MD   500 mg at 04/27/14 0819  . amitriptyline (ELAVIL) tablet 50 mg  50 mg Oral QHS Virgel Manifold, MD   50 mg at 04/26/14 2108  . ARIPiprazole (ABILIFY) tablet 10 mg  10 mg Oral Daily Virgel Manifold, MD   10 mg at 04/27/14 0826  . cloNIDine (CATAPRES) tablet 0.1 mg  0.1 mg Oral QID Virgel Manifold, MD   0.1 mg at 04/27/14 0820   Followed by  . [START ON 04/28/2014] cloNIDine (  CATAPRES) tablet 0.1 mg  0.1 mg Oral BH-qamhs Virgel Manifold, MD       Followed by  . [START ON 05/01/2014] cloNIDine (CATAPRES) tablet 0.1 mg  0.1 mg Oral QAC breakfast Virgel Manifold, MD      . dicyclomine (BENTYL) tablet 20 mg  20 mg Oral Q6H PRN Virgel Manifold, MD   20 mg at 04/27/14 0820  . FLUoxetine (PROZAC) capsule 80 mg  80 mg Oral Daily Virgel Manifold, MD   80 mg at 04/27/14 0826  . gabapentin (NEURONTIN) capsule 100 mg   100 mg Oral TID Virgel Manifold, MD   100 mg at 04/27/14 8366  . hydrOXYzine (ATARAX/VISTARIL) tablet 25 mg  25 mg Oral Q6H PRN Virgel Manifold, MD   25 mg at 04/27/14 2947  . lisdexamfetamine (VYVANSE) capsule 60 mg  60 mg Oral q morning - 10a Virgel Manifold, MD   60 mg at 04/27/14 0820  . loperamide (IMODIUM) capsule 2-4 mg  2-4 mg Oral PRN Virgel Manifold, MD   2 mg at 04/26/14 2114  . loratadine (CLARITIN) tablet 10 mg  10 mg Oral Daily PRN Virgel Manifold, MD   10 mg at 04/26/14 2221  . methocarbamol (ROBAXIN) tablet 500 mg  500 mg Oral Q8H PRN Virgel Manifold, MD   500 mg at 04/27/14 0820  . pantoprazole (PROTONIX) EC tablet 40 mg  40 mg Oral Daily Virgel Manifold, MD   40 mg at 04/27/14 0820  . promethazine (PHENERGAN) suppository 25 mg  25 mg Rectal Q6H PRN Virgel Manifold, MD      . promethazine (PHENERGAN) tablet 12.5 mg  12.5 mg Oral Q4H PRN Virgel Manifold, MD   12.5 mg at 04/27/14 0819  . risperiDONE (RISPERDAL M-TABS) disintegrating tablet 1 mg  1 mg Oral QHS Virgel Manifold, MD   1 mg at 04/26/14 2108  . traZODone (DESYREL) tablet 25 mg  25 mg Oral QHS PRN Virgel Manifold, MD   25 mg at 04/26/14 2108  . zolpidem (AMBIEN) tablet 5 mg  5 mg Oral QHS PRN Evelina Bucy, MD   5 mg at 04/27/14 0014   Current Outpatient Prescriptions  Medication Sig Dispense Refill  . ABILIFY 10 MG tablet Take 10 mg by mouth daily.  0  . acetaminophen (TYLENOL) 500 MG tablet Take 1 tablet (500 mg total) by mouth every 6 (six) hours as needed for mild pain or headache. 30 tablet 0  . amitriptyline (ELAVIL) 25 MG tablet Take 50 mg by mouth at bedtime.  0  . lisdexamfetamine (VYVANSE) 60 MG capsule Take 60 mg by mouth every morning.    Marland Kitchen oxymorphone (OPANA) 10 MG tablet Take 10-20 mg by mouth every 4 (four) hours as needed for pain.    . clindamycin (CLEOCIN) 300 MG capsule Take 1 capsule (300 mg total) by mouth 4 (four) times daily. X 7 days (Patient not taking: Reported on 04/26/2014) 28 capsule 0  . FLUoxetine (PROZAC) 40 MG  capsule Take 2 capsules (80 mg total) by mouth daily. (Patient not taking: Reported on 04/26/2014) 60 capsule 0  . gabapentin (NEURONTIN) 100 MG capsule Take 1 capsule (100 mg total) by mouth 3 (three) times daily. (Patient not taking: Reported on 04/26/2014) 90 capsule 0  . hydrOXYzine (ATARAX/VISTARIL) 25 MG tablet Take 1 tablet (25 mg total) by mouth every 6 (six) hours as needed for anxiety. (Patient not taking: Reported on 04/26/2014) 14 tablet 0  . methocarbamol (ROBAXIN) 500 MG tablet Take 1 tablet (500 mg total)  by mouth every 8 (eight) hours as needed for muscle spasms. (Patient not taking: Reported on 04/26/2014) 14 tablet 0  . OxyCODONE (OXYCONTIN) 80 mg T12A 12 hr tablet Take 1 tablet (80 mg total) by mouth every 12 (twelve) hours. (Patient taking differently: Take 80 mg by mouth every 8 (eight) hours. ) 6 tablet 0  . pantoprazole (PROTONIX) 40 MG tablet Take 1 tablet (40 mg total) by mouth daily. (Patient not taking: Reported on 04/26/2014)    . polyethylene glycol (MIRALAX / GLYCOLAX) packet Take 17 g by mouth 2 (two) times daily as needed (constipation).     . promethazine (PHENERGAN) 25 MG suppository Place 1 suppository (25 mg total) rectally every 6 (six) hours as needed for nausea or vomiting. (Patient not taking: Reported on 04/26/2014) 12 each 0  . risperiDONE (RISPERDAL M-TABS) 1 MG disintegrating tablet Take 1 tablet (1 mg total) by mouth at bedtime. (Patient not taking: Reported on 04/26/2014) 30 tablet 0  . traZODone (DESYREL) 25 mg TABS tablet Take 0.5 tablets (25 mg total) by mouth at bedtime as needed for sleep. (Patient not taking: Reported on 04/26/2014) 14 tablet 0    Musculoskeletal: Strength & Muscle Tone: within normal limits Gait & Station: normal Patient leans: N/A  Psychiatric Specialty Exam:     Blood pressure 111/71, pulse 98, temperature 98.2 F (36.8 C), temperature source Oral, resp. rate 18, last menstrual period 08/10/2011, SpO2 98 %.There is no weight on file to  calculate BMI.  General Appearance: Casual  Eye Contact::  Good  Speech:  Clear and Coherent and Normal Rate  Volume:  Normal  Mood:  Angry and Anxious  Affect:  Congruent and Depressed  Thought Process:  Coherent, Goal Directed and Intact  Orientation:  Full (Time, Place, and Person)  Thought Content:  WDL  Suicidal Thoughts:  No  Homicidal Thoughts:  No  Memory:  Immediate;   Good Recent;   Good Remote;   Good  Judgement:  Good  Insight:  Fair  Psychomotor Activity:  Normal  Concentration:  Good  Recall:  NA  Fund of Knowledge:Fair  Language: NA  Akathisia:  NA  Handed:  Right  AIMS (if indicated):     Assets:  Desire for Improvement  ADL's:  Intact  Cognition: WNL  Sleep:      Medical Decision Making: Established Problem, Stable/Improving (1)  Treatment Plan Summary: discharge home  Plan:  discharge home to family Disposition: see above  Delfin Gant   PMHNP-BC 04/27/2014 11:52 AM Patient seen face-to-face for psychiatric evaluation, chart reviewed and case discussed with the physician extender and developed treatment plan. Reviewed the information documented and agree with the treatment plan. Corena Pilgrim, MD

## 2014-04-27 NOTE — [facility] Suicide Risk Assessment (Cosign Needed)
Suicide Risk Assessment  Discharge Assessment   [facility] Discharge Suicide Risk Assessment   Demographic Factors:  Caucasian, Low socioeconomic status and Unemployed  Total Time spent with patient: 20 minutes  Musculoskeletal: Strength & Muscle Tone: within normal limits Gait & Station: normal Patient leans: N/A  Psychiatric Specialty Exam:     Blood pressure 98/62, pulse 85, temperature 99.9 F (37.7 C), temperature source Oral, resp. rate 16, last menstrual period 08/10/2011, SpO2 99 %.There is no weight on file to calculate BMI.  General Appearance: Casual and Fairly Groomed  Eye Contact::  Good  Speech:  Clear and Coherent and Normal Rate409  Volume:  Normal  Mood:  Angry, Anxious and Depressed  Affect:  Congruent and Depressed  Thought Process:  Coherent, Goal Directed and Intact  Orientation:  Full (Time, Place, and Person)  Thought Content:  WDL  Suicidal Thoughts:  No  Homicidal Thoughts:  No  Memory:  Immediate;   Good Recent;   Good Remote;   Good  Judgement:  Fair  Insight:  Fair  Psychomotor Activity:  Normal  Concentration:  Good  Recall:  NA  Fund of Knowledge:Fair  Language: Good  Akathisia:  NA  Handed:  Right  AIMS (if indicated):     Assets:  Desire for Improvement  Sleep:     Cognition: WNL  ADL's:  Intact      Has this patient used any form of tobacco in the last 30 days? (Cigarettes, Smokeless Tobacco, Cigars, and/or Pipes) N/A  Mental Status Per Nursing Assessment::   On Admission:     Current Mental Status by Physician: NA  Loss Factors: NA  Historical Factors: Prior suicide attempts  Risk Reduction Factors:   Responsible for children under 23 years of age, Sense of responsibility to family, Living with another person, especially a relative and Positive therapeutic relationship  Continued Clinical Symptoms:  Bipolar Disorder:   Depressive phase Depression:   Insomnia  Cognitive Features That Contribute To Risk:  Polarized  thinking    Suicide Risk:  Minimal: No identifiable suicidal ideation.  Patients presenting with no risk factors but with morbid ruminations; may be classified as minimal risk based on the severity of the depressive symptoms  Principal Problem: MDD (major depressive disorder) Discharge Diagnoses:  Patient Active Problem List   Diagnosis Date Noted  . MDD (major depressive disorder) [F32.2] 07/07/2013    Priority: High  . Drug dependence [F19.20]   . Polysubstance (including opioids) dependence with physiological dependence [F19.20] 07/08/2013  . Suicidal ideation [R45.851] 07/06/2013  . Nausea & vomiting [R11.2] 07/06/2013  . Chronic pain disorder [G89.4] 07/06/2013  . Cocaine abuse [F14.10] 07/06/2013  . Major depression [F32.2] 01/05/2012  . Alcohol dependence [F10.20] 01/04/2012  . Opioid dependence [F11.20] 12/29/2011  . Back pain [M54.9] 12/29/2011  . N&V (nausea and vomiting) [R11.2] 06/21/2011  . Tachycardia [R00.0] 06/21/2011  . Abdominal pain [R10.9] 06/21/2011  . Leukocytosis [D72.829] 06/21/2011      Plan Of Care/Follow-up recommendations:  Activity:  as tolerated Diet:  regular  Is patient on multiple antipsychotic therapies at discharge:  No   Has Patient had three or more failed trials of antipsychotic monotherapy by history:  No  Recommended Plan for Multiple Antipsychotic Therapies: NA    Geovanna Simko C   PMHNP-BC 04/27/2014, 12:20 PM

## 2014-08-13 ENCOUNTER — Encounter (HOSPITAL_COMMUNITY): Payer: Self-pay | Admitting: Emergency Medicine

## 2014-08-13 ENCOUNTER — Emergency Department (HOSPITAL_COMMUNITY)
Admission: EM | Admit: 2014-08-13 | Discharge: 2014-08-13 | Disposition: A | Discharge status: Home / Self Care | Payer: Medicaid Other | Attending: Emergency Medicine | Admitting: Emergency Medicine

## 2014-08-13 DIAGNOSIS — Z8742 Personal history of other diseases of the female genital tract: Secondary | ICD-10-CM | POA: Insufficient documentation

## 2014-08-13 DIAGNOSIS — Z8739 Personal history of other diseases of the musculoskeletal system and connective tissue: Secondary | ICD-10-CM | POA: Diagnosis not present

## 2014-08-13 DIAGNOSIS — Y9289 Other specified places as the place of occurrence of the external cause: Secondary | ICD-10-CM | POA: Insufficient documentation

## 2014-08-13 DIAGNOSIS — G8929 Other chronic pain: Secondary | ICD-10-CM | POA: Insufficient documentation

## 2014-08-13 DIAGNOSIS — X58XXXA Exposure to other specified factors, initial encounter: Secondary | ICD-10-CM | POA: Insufficient documentation

## 2014-08-13 DIAGNOSIS — Z3202 Encounter for pregnancy test, result negative: Secondary | ICD-10-CM | POA: Insufficient documentation

## 2014-08-13 DIAGNOSIS — F419 Anxiety disorder, unspecified: Secondary | ICD-10-CM | POA: Diagnosis not present

## 2014-08-13 DIAGNOSIS — T391X1A Poisoning by 4-Aminophenol derivatives, accidental (unintentional), initial encounter: Secondary | ICD-10-CM | POA: Insufficient documentation

## 2014-08-13 DIAGNOSIS — T50901A Poisoning by unspecified drugs, medicaments and biological substances, accidental (unintentional), initial encounter: Secondary | ICD-10-CM

## 2014-08-13 DIAGNOSIS — Z720 Tobacco use: Secondary | ICD-10-CM | POA: Diagnosis not present

## 2014-08-13 DIAGNOSIS — Z79899 Other long term (current) drug therapy: Secondary | ICD-10-CM | POA: Diagnosis not present

## 2014-08-13 DIAGNOSIS — Y998 Other external cause status: Secondary | ICD-10-CM | POA: Insufficient documentation

## 2014-08-13 DIAGNOSIS — R51 Headache: Secondary | ICD-10-CM | POA: Diagnosis present

## 2014-08-13 DIAGNOSIS — Y9389 Activity, other specified: Secondary | ICD-10-CM | POA: Diagnosis not present

## 2014-08-13 DIAGNOSIS — G43809 Other migraine, not intractable, without status migrainosus: Secondary | ICD-10-CM | POA: Insufficient documentation

## 2014-08-13 DIAGNOSIS — R1013 Epigastric pain: Secondary | ICD-10-CM | POA: Diagnosis not present

## 2014-08-13 DIAGNOSIS — Z880 Allergy status to penicillin: Secondary | ICD-10-CM | POA: Diagnosis not present

## 2014-08-13 DIAGNOSIS — K219 Gastro-esophageal reflux disease without esophagitis: Secondary | ICD-10-CM | POA: Diagnosis not present

## 2014-08-13 LAB — CBC
HEMATOCRIT: 42.2 % (ref 36.0–46.0)
Hemoglobin: 13.9 g/dL (ref 12.0–15.0)
MCH: 29.1 pg (ref 26.0–34.0)
MCHC: 32.9 g/dL (ref 30.0–36.0)
MCV: 88.3 fL (ref 78.0–100.0)
Platelets: 353 10*3/uL (ref 150–400)
RBC: 4.78 MIL/uL (ref 3.87–5.11)
RDW: 14 % (ref 11.5–15.5)
WBC: 12.7 10*3/uL — ABNORMAL HIGH (ref 4.0–10.5)

## 2014-08-13 LAB — COMPREHENSIVE METABOLIC PANEL
ALT: 32 U/L (ref 14–54)
AST: 18 U/L (ref 15–41)
Albumin: 4.3 g/dL (ref 3.5–5.0)
Alkaline Phosphatase: 97 U/L (ref 38–126)
Anion gap: 7 (ref 5–15)
BUN: 12 mg/dL (ref 6–20)
CO2: 24 mmol/L (ref 22–32)
CREATININE: 0.67 mg/dL (ref 0.44–1.00)
Calcium: 9.2 mg/dL (ref 8.9–10.3)
Chloride: 106 mmol/L (ref 101–111)
GFR calc Af Amer: >60 mL/min (ref >60)
Glucose, Bld: 105 mg/dL — ABNORMAL HIGH (ref 65–99)
POTASSIUM: 3.6 mmol/L (ref 3.5–5.1)
Sodium: 137 mmol/L (ref 135–145)
Total Bilirubin: 0.3 mg/dL (ref 0.3–1.2)
Total Protein: 8.8 g/dL — ABNORMAL HIGH (ref 6.5–8.1)

## 2014-08-13 LAB — RAPID URINE DRUG SCREEN, HOSP PERFORMED
Amphetamines: NOT DETECTED
Barbiturates: NOT DETECTED
Benzodiazepines: NOT DETECTED
COCAINE: NOT DETECTED
OPIATES: POSITIVE — AB
Tetrahydrocannabinol: NOT DETECTED

## 2014-08-13 LAB — CBG MONITORING, ED: Glucose-Capillary: 102 mg/dL — ABNORMAL HIGH (ref 65–99)

## 2014-08-13 LAB — ACETAMINOPHEN LEVEL: Acetaminophen (Tylenol), Serum: 99 ug/mL — ABNORMAL HIGH (ref 10–30)

## 2014-08-13 LAB — POC URINE PREG, ED: Preg Test, Ur: NEGATIVE

## 2014-08-13 LAB — SALICYLATE LEVEL

## 2014-08-13 LAB — ETHANOL: Alcohol, Ethyl (B): <5 mg/dL (ref <5)

## 2014-08-13 MED ORDER — SODIUM CHLORIDE 0.9 % IV SOLN: Freq: Once | INTRAVENOUS | Status: DC

## 2014-08-13 MED ORDER — DIPHENHYDRAMINE HCL 50 MG/ML IJ SOLN
25.0000 mg | Freq: Once | INTRAMUSCULAR | Status: AC
  Administered 2014-08-13: 25 mg via INTRAVENOUS
  Filled 2014-08-13: qty 1

## 2014-08-13 MED ORDER — METOCLOPRAMIDE HCL 5 MG/ML IJ SOLN
10.0000 mg | Freq: Once | INTRAMUSCULAR | Status: AC
  Administered 2014-08-13: 10 mg via INTRAVENOUS
  Filled 2014-08-13: qty 2

## 2014-08-13 MED ORDER — HYDROMORPHONE HCL 1 MG/ML IJ SOLN
1.0000 mg | Freq: Once | INTRAMUSCULAR | Status: AC
  Administered 2014-08-13: 1 mg via INTRAVENOUS
  Filled 2014-08-13: qty 1

## 2014-08-13 MED ORDER — SODIUM CHLORIDE 0.9 % IV BOLUS (SEPSIS)
1000.0000 mL | Freq: Once | INTRAVENOUS | Status: AC
  Administered 2014-08-13: 1000 mL via INTRAVENOUS

## 2014-08-13 NOTE — ED Notes (Signed)
Bed: RESB Expected date: 08/13/14 Expected time: 4:57 PM Means of arrival: Ambulance Comments: 50-60 500mg  Tylenol to relieve Migraine

## 2014-08-13 NOTE — ED Provider Notes (Signed)
CSN: [id number]     Arrival date & time 08/13/14  1703 History   First MD Initiated Contact with Patient 08/13/14 1706     Chief Complaint  Patient presents with  . Drug Overdose     (Consider location/radiation/quality/duration/timing/severity/associated sxs/prior Treatment) HPI Comments: States that she gets migraines and has chronic low back pain. Pt states that she was trying to get rid of a migraine and took 50-60 (500 mg) tabs of acetaminophen about 1530 today. States that this was not an attempt to hurt herself.   Patient is a 34 y.o. female presenting with Overdose. The history is provided by the patient. No language interpreter was used.  Drug Overdose This is a new problem. Episode onset:  1530. The problem occurs constantly. The problem has not changed since onset.Associated symptoms include abdominal pain and headaches. Pertinent negatives include no chest pain and no shortness of breath. Associated symptoms comments: Nausea. Nothing aggravates the symptoms. Nothing relieves the symptoms. She has tried nothing for the symptoms. The treatment provided no relief.    Past Medical History  Diagnosis Date  . Migraine   . GERD (gastroesophageal reflux disease)     protonix evenings  . Endometriosis     chronic pain  . Anxiety     klonipin for stress disorder  . Degenerative disc disease   . Chronic back pain    Past Surgical History  Procedure Laterality Date  . Appendectomy    . Tonsillectomy    . Tubal ligation    . Laparoscopic assisted vaginal hysterectomy  08/19/2011    Procedure: LAPAROSCOPIC ASSISTED VAGINAL HYSTERECTOMY;  Surgeon: Gus Height, MD;  Location: [facility];  Service: Gynecology;  Laterality: N/A;  . Abdominal hysterectomy     Family History  Problem Relation Age of Onset  . Coronary artery disease Father   . Coronary artery disease Maternal Grandmother   . Breast cancer Mother   . Cancer Mother    History  Substance Use Topics  . Smoking status:  Current Every Day Smoker -- 1.00 packs/day for 14 years    Types: Cigarettes  . Smokeless tobacco: Not on file  . Alcohol Use: No   OB History    Gravida Para Term Preterm AB TAB SAB Ectopic Multiple Living   6 2             Review of Systems  Constitutional: Negative for fever, chills, diaphoresis, activity change, appetite change and fatigue.  HENT: Negative for congestion, facial swelling, rhinorrhea and sore throat.   Eyes: Negative for photophobia and discharge.  Respiratory: Negative for cough, chest tightness and shortness of breath.   Cardiovascular: Negative for chest pain, palpitations and leg swelling.  Gastrointestinal: Positive for abdominal pain. Negative for nausea, vomiting and diarrhea.  Endocrine: Negative for polydipsia and polyuria.  Genitourinary: Negative for dysuria, frequency, difficulty urinating and pelvic pain.  Musculoskeletal: Negative for back pain, arthralgias, neck pain and neck stiffness.  Skin: Negative for color change and wound.  Allergic/Immunologic: Negative for immunocompromised state.  Neurological: Positive for headaches. Negative for facial asymmetry, weakness and numbness.  Hematological: Does not bruise/bleed easily.  Psychiatric/Behavioral: Negative for confusion and agitation.      Allergies  Epidural tray 17gx3-1-2"; Ibuprofen; Penicillins; and Zofran  Home Medications   Prior to Admission medications   Medication Sig Start Date End Date Taking? Authorizing Provider  ABILIFY 10 MG tablet Take 10 mg by mouth every morning.  04/04/14  Yes Historical Provider, MD  acetaminophen (TYLENOL)  500 MG tablet Take 1 tablet (500 mg total) by mouth every 6 (six) hours as needed for mild pain or headache. 07/13/13  Yes Benjamine Mola, FNP  amitriptyline (ELAVIL) 100 MG tablet take 1-3 tablets by mouth at bedtime for depression sleep or pain 07/17/14  Yes Historical Provider, MD  OxyCODONE HCl ER (OXYCONTIN) 60 MG T12A Take 60 mg by mouth 2 (two)  times daily.   Yes Historical Provider, MD  oxymorphone (OPANA) 10 MG tablet Take 10 mg by mouth every 3 (three) hours as needed for pain.    Yes Historical Provider, MD  polyethylene glycol (MIRALAX / GLYCOLAX) packet Take 17 g by mouth 2 (two) times daily as needed (constipation).    Yes Historical Provider, MD  VYVANSE 70 MG capsule take 1 capsule by mouth every morning if needed for ATTENTION 07/17/14  Yes Historical Provider, MD  amitriptyline (ELAVIL) 25 MG tablet Take 50 mg by mouth at bedtime. 03/28/14   Historical Provider, MD  FLUoxetine (PROZAC) 40 MG capsule Take 2 capsules (80 mg total) by mouth daily. Patient not taking: Reported on 04/26/2014 07/13/13   Benjamine Mola, FNP  gabapentin (NEURONTIN) 100 MG capsule Take 1 capsule (100 mg total) by mouth 3 (three) times daily. Patient not taking: Reported on 04/26/2014 07/13/13   Benjamine Mola, FNP  pantoprazole (PROTONIX) 40 MG tablet Take 1 tablet (40 mg total) by mouth daily. Patient not taking: Reported on 04/26/2014 07/13/13   Benjamine Mola, FNP  traZODone (DESYREL) 25 mg TABS tablet Take 0.5 tablets (25 mg total) by mouth at bedtime as needed for sleep. Patient not taking: Reported on 04/26/2014 07/13/13   Benjamine Mola, FNP   BP 107/73 mmHg  Pulse 92  Temp(Src) 98.5 F (36.9 C) (Oral)  Resp 14  Ht 5\' 4"  (1.626 m)  Wt 165 lb (74.844 kg)  BMI 28.31 kg/m2  SpO2 98%  LMP 08/10/2011 Physical Exam  Constitutional: She is oriented to person, place, and time. She appears well-developed and well-nourished. No distress.  HENT:  Head: Normocephalic and atraumatic.  Mouth/Throat: No oropharyngeal exudate.  Eyes: Pupils are equal, round, and reactive to light.  Neck: Normal range of motion. Neck supple.  Cardiovascular: Normal rate, regular rhythm and normal heart sounds.  Exam reveals no gallop and no friction rub.   No murmur heard. Pulmonary/Chest: Effort normal and breath sounds normal. No respiratory distress. She has no wheezes. She  has no rales.  Abdominal: Soft. Bowel sounds are normal. She exhibits no distension and no mass. There is tenderness in the epigastric area. There is no rebound and no guarding.  Musculoskeletal: Normal range of motion. She exhibits no edema or tenderness.  Neurological: She is alert and oriented to person, place, and time. She has normal strength. She displays no atrophy and no tremor. No cranial nerve deficit or sensory deficit. She exhibits normal muscle tone. She displays a negative Romberg sign. Coordination normal. GCS eye subscore is 4. GCS verbal subscore is 5. GCS motor subscore is 6.  Skin: Skin is warm and dry.  Psychiatric: She has a normal mood and affect.    ED Course  Procedures (including critical care time) Labs Review Labs Reviewed  COMPREHENSIVE METABOLIC PANEL - Abnormal; Notable for the following:    Glucose, Bld 105 (*)    Total Protein 8.8 (*)    All other components within normal limits  CBC - Abnormal; Notable for the following:    WBC 12.7 (*)  All other components within normal limits  URINE RAPID DRUG SCREEN, HOSP PERFORMED - Abnormal; Notable for the following:    Opiates POSITIVE (*)    All other components within normal limits  ACETAMINOPHEN LEVEL - Abnormal; Notable for the following:    Acetaminophen (Tylenol), Serum 99 (*)    All other components within normal limits  CBG MONITORING, ED - Abnormal; Notable for the following:    Glucose-Capillary 102 (*)    All other components within normal limits  ETHANOL  SALICYLATE LEVEL  POC URINE PREG, ED    Imaging Review No results found.   EKG Interpretation   Date/Time:  Sunday August 13 2014 17:14:53 EDT Ventricular Rate:  107 PR Interval:  152 QRS Duration: 97 QT Interval:  370 QTC Calculation: 494 R Axis:   -5 Text Interpretation:  Sinus tachycardia Borderline prolonged QT interval  Otherwise no significant change Confirmed by HARRISON  MD, FORREST ([id number])  on 08/13/2014 5:18:31 PM       MDM   Final diagnoses:  Accidental drug overdose, initial encounter  Other migraine without status migrainosus, not intractable    Pt is a 34 y.o. female with Pmhx as above including migraines and chronic pain who presents with Tylenol ingestion.  Patient reports that she is a history of migraines and chronic low back pain and that she had atypical left sided migrainous headache that started yesterday and that to try to rid herself of the migraine.  She took approximately 50-6500 mg tablets of acetaminophen at about 1530 today.  She has denied to multiple providers as well as to EMS that this was not to harm herself.  She denies being depressed or having any recent psychiatric issues.  About 1 hour after taking the Tylenol.  She began having some abdominal upset and nausea.  She's had no vomiting.  She denies other co ingestants.  On physical exam, vital signs are stable and she is in no acute distress.  She is mild epigastric tenderness.  Current pulmonary exam is benign.  She's no focal neurologic findings.  Patient will be treated with a migraine cocktail.  We'll get a four-hour Tylenol level.  No she takes large doses of narcotics, including OxyContin ER and Opana.   4 hour Tylenol level is 99, which is under the treatment level for Tylenol overdose nomogram.  LFTs are normal.  Case is closed per poison control.  I've asked her to follow-up with her neurologist for migraine symptoms.    [name] evaluation in the Emergency Department is complete. It has been determined that no acute conditions requiring further emergency intervention are present at this time. The patient/guardian have been advised of the diagnosis and plan. We have discussed signs and symptoms that warrant return to the ED, such as changes or worsening in symptoms, abdominal pain, vomiting, inability to tolerate liquids, concern for self-harm      Ernestina Patches, MD 08/14/14 [phone]

## 2014-08-13 NOTE — ED Notes (Signed)
Poison Control notified. Spoke with Barnett Applebaum.   Recommended a tylenol level 4 hours post ingestion, EKG, Cardiac monitoring, Keep hydrated, Attempt to find out if anything else was ingested and if so call poison control back.

## 2014-08-13 NOTE — ED Notes (Signed)
Per EMS: Pt from home.  States that she gets migraines and has chronic low back pain.  Pt states that she was trying to get rid of a migraine and took 50-60 (500 mg) tabs of acetaminophen about 1530 today.  States that this was not an attempt to hurt herself.

## 2014-08-13 NOTE — Discharge Instructions (Signed)
Migraine Headache A migraine headache is an intense, throbbing pain on one or both sides of your head. A migraine can last for 30 minutes to several hours. CAUSES  The exact cause of a migraine headache is not always known. However, a migraine may be caused when nerves in the brain become irritated and release chemicals that cause inflammation. This causes pain. Certain things may also trigger migraines, such as:  Alcohol.  Smoking.  Stress.  Menstruation.  Aged cheeses.  Foods or drinks that contain nitrates, glutamate, aspartame, or tyramine.  Lack of sleep.  Chocolate.  Caffeine.  Hunger.  Physical exertion.  Fatigue.  Medicines used to treat chest pain (nitroglycerine), birth control pills, estrogen, and some blood pressure medicines. SIGNS AND SYMPTOMS  Pain on one or both sides of your head.  Pulsating or throbbing pain.  Severe pain that prevents daily activities.  Pain that is aggravated by any physical activity.  Nausea, vomiting, or both.  Dizziness.  Pain with exposure to bright lights, loud noises, or activity.  General sensitivity to bright lights, loud noises, or smells. Before you get a migraine, you may get warning signs that a migraine is coming (aura). An aura may include:  Seeing flashing lights.  Seeing bright spots, halos, or zigzag lines.  Having tunnel vision or blurred vision.  Having feelings of numbness or tingling.  Having trouble talking.  Having muscle weakness. DIAGNOSIS  A migraine headache is often diagnosed based on:  Symptoms.  Physical exam.  A CT scan or MRI of your head. These imaging tests cannot diagnose migraines, but they can help rule out other causes of headaches. TREATMENT Medicines may be given for pain and nausea. Medicines can also be given to help prevent recurrent migraines.  HOME CARE INSTRUCTIONS  Only take over-the-counter or prescription medicines for pain or discomfort as directed by your  health care provider. The use of long-term narcotics is not recommended.  Lie down in a dark, quiet room when you have a migraine.  Keep a journal to find out what may trigger your migraine headaches. For example, write down:  What you eat and drink.  How much sleep you get.  Any change to your diet or medicines.  Limit alcohol consumption.  Quit smoking if you smoke.  Get 7-9 hours of sleep, or as recommended by your health care provider.  Limit stress.  Keep lights dim if bright lights bother you and make your migraines worse. SEEK IMMEDIATE MEDICAL CARE IF:   Your migraine becomes severe.  You have a fever.  You have a stiff neck.  You have vision loss.  You have muscular weakness or loss of muscle control.  You start losing your balance or have trouble walking.  You feel faint or pass out.  You have severe symptoms that are different from your first symptoms. MAKE SURE YOU:   Understand these instructions.  Will watch your condition.  Will get help right away if you are not doing well or get worse. Document Released: 01/06/2005 Document Revised: 05/23/2013 Document Reviewed: 09/13/2012 [facility] [place] Patient Information 2015 [place], [place]. This information is not intended to replace advice given to you by your health care provider. Make sure you discuss any questions you have with your health care provider.   Nontoxic Ingestion Your exam shows your ingestion is not likely to cause serious medical problems. Further treatment is not needed at this time. If you have vomited since your ingestion, you should not drink or eat for at  least 2 to 3 hours. Then start with small sips of clear liquids until your stomach settles. You should not drink alcohol or take illegal recreational drugs or other mind-altering substances as this may worsen your condition. Sometimes the effects of drugs and other substances can be delayed. SEEK IMMEDIATE MEDICAL CARE IF:  You develop  confusion, sleepiness, agitation, or difficulty walking.  You develop breathing problems, a cough, difficulty swallowing, or excess mucus.  You develop a stomach ache, repeated vomiting, or severe diarrhea.  You develop weakness, fever, or dehydration. Document Released: 02/14/2004 Document Revised: 03/31/2011 Document Reviewed: 02/07/2008 [facility] Patient Information 2015 [place], [place]. This information is not intended to replace advice given to you by your health care provider. Make sure you discuss any questions you have with your health care provider.

## 2014-08-13 NOTE — ED Provider Notes (Signed)
Procedure note: Ultrasound Guided Peripheral IV Ultrasound guided 20 g peripheral 1.88 inch angiocath IV placement performed by me. Indications: Nursing unable to place IV. Details: The right antecubital fossa and upper arm was evaluated with a multifrequency linear probe. Several patent brachial veins are noted. 1 attempts were made to cannulate a antecubital vein under realtime US guidance with successful cannulation of the vein and catheter placement. There is return of non-pulsatile dark red blood. The patient tolerated the procedure well without complications.    Pamella Pert, MD 08/13/14 1759

## 2014-08-13 NOTE — ED Notes (Signed)
Pt taken home by mother in law. Alert, oriented and ambulatory upon discharge.

## 2014-09-19 ENCOUNTER — Encounter (HOSPITAL_COMMUNITY): Payer: Self-pay

## 2014-09-19 ENCOUNTER — Inpatient Hospital Stay (HOSPITAL_COMMUNITY)
Admission: EM | Admit: 2014-09-19 | Discharge: 2014-09-21 | DRG: 918 | Disposition: A | Discharge status: Other Acute Inpatient Hospital | Payer: Medicaid Other | Attending: Internal Medicine | Admitting: Internal Medicine

## 2014-09-19 DIAGNOSIS — G4700 Insomnia, unspecified: Secondary | ICD-10-CM | POA: Diagnosis present

## 2014-09-19 DIAGNOSIS — E876 Hypokalemia: Secondary | ICD-10-CM | POA: Diagnosis present

## 2014-09-19 DIAGNOSIS — F419 Anxiety disorder, unspecified: Secondary | ICD-10-CM | POA: Diagnosis present

## 2014-09-19 DIAGNOSIS — D72829 Elevated white blood cell count, unspecified: Secondary | ICD-10-CM | POA: Diagnosis present

## 2014-09-19 DIAGNOSIS — F329 Major depressive disorder, single episode, unspecified: Secondary | ICD-10-CM | POA: Diagnosis present

## 2014-09-19 DIAGNOSIS — R45851 Suicidal ideations: Secondary | ICD-10-CM

## 2014-09-19 DIAGNOSIS — F17210 Nicotine dependence, cigarettes, uncomplicated: Secondary | ICD-10-CM | POA: Diagnosis present

## 2014-09-19 DIAGNOSIS — Z79891 Long term (current) use of opiate analgesic: Secondary | ICD-10-CM | POA: Diagnosis not present

## 2014-09-19 DIAGNOSIS — K219 Gastro-esophageal reflux disease without esophagitis: Secondary | ICD-10-CM | POA: Diagnosis present

## 2014-09-19 DIAGNOSIS — Z884 Allergy status to anesthetic agent status: Secondary | ICD-10-CM | POA: Diagnosis not present

## 2014-09-19 DIAGNOSIS — Z888 Allergy status to other drugs, medicaments and biological substances status: Secondary | ICD-10-CM

## 2014-09-19 DIAGNOSIS — T391X2A Poisoning by 4-Aminophenol derivatives, intentional self-harm, initial encounter: Principal | ICD-10-CM | POA: Diagnosis present

## 2014-09-19 DIAGNOSIS — G43909 Migraine, unspecified, not intractable, without status migrainosus: Secondary | ICD-10-CM | POA: Diagnosis present

## 2014-09-19 DIAGNOSIS — F1120 Opioid dependence, uncomplicated: Secondary | ICD-10-CM | POA: Diagnosis not present

## 2014-09-19 DIAGNOSIS — M545 Low back pain: Secondary | ICD-10-CM | POA: Diagnosis present

## 2014-09-19 DIAGNOSIS — Z803 Family history of malignant neoplasm of breast: Secondary | ICD-10-CM

## 2014-09-19 DIAGNOSIS — F909 Attention-deficit hyperactivity disorder, unspecified type: Secondary | ICD-10-CM | POA: Diagnosis present

## 2014-09-19 DIAGNOSIS — Z880 Allergy status to penicillin: Secondary | ICD-10-CM | POA: Diagnosis not present

## 2014-09-19 DIAGNOSIS — Z886 Allergy status to analgesic agent status: Secondary | ICD-10-CM

## 2014-09-19 DIAGNOSIS — R110 Nausea: Secondary | ICD-10-CM | POA: Diagnosis present

## 2014-09-19 DIAGNOSIS — Z8249 Family history of ischemic heart disease and other diseases of the circulatory system: Secondary | ICD-10-CM

## 2014-09-19 DIAGNOSIS — F332 Major depressive disorder, recurrent severe without psychotic features: Secondary | ICD-10-CM | POA: Diagnosis not present

## 2014-09-19 DIAGNOSIS — G8929 Other chronic pain: Secondary | ICD-10-CM | POA: Diagnosis present

## 2014-09-19 DIAGNOSIS — T1491 Suicide attempt: Secondary | ICD-10-CM | POA: Diagnosis not present

## 2014-09-19 DIAGNOSIS — G894 Chronic pain syndrome: Secondary | ICD-10-CM | POA: Diagnosis present

## 2014-09-19 DIAGNOSIS — M549 Dorsalgia, unspecified: Secondary | ICD-10-CM | POA: Diagnosis present

## 2014-09-19 DIAGNOSIS — Z79899 Other long term (current) drug therapy: Secondary | ICD-10-CM

## 2014-09-19 DIAGNOSIS — T1491XA Suicide attempt, initial encounter: Secondary | ICD-10-CM

## 2014-09-19 DIAGNOSIS — T391X1A Poisoning by 4-Aminophenol derivatives, accidental (unintentional), initial encounter: Secondary | ICD-10-CM | POA: Diagnosis present

## 2014-09-19 DIAGNOSIS — F322 Major depressive disorder, single episode, severe without psychotic features: Secondary | ICD-10-CM | POA: Diagnosis not present

## 2014-09-19 LAB — CBC
HCT: 43 % (ref 36.0–46.0)
HEMOGLOBIN: 14.5 g/dL (ref 12.0–15.0)
MCH: 30.4 pg (ref 26.0–34.0)
MCHC: 33.7 g/dL (ref 30.0–36.0)
MCV: 90.1 fL (ref 78.0–100.0)
PLATELETS: 345 10*3/uL (ref 150–400)
RBC: 4.77 MIL/uL (ref 3.87–5.11)
RDW: 13.9 % (ref 11.5–15.5)
WBC: 17.2 10*3/uL — ABNORMAL HIGH (ref 4.0–10.5)

## 2014-09-19 LAB — PROTIME-INR
INR: 0.96 (ref 0.00–1.49)
Prothrombin Time: 13 seconds (ref 11.6–15.2)

## 2014-09-19 LAB — COMPREHENSIVE METABOLIC PANEL
ALBUMIN: 4.5 g/dL (ref 3.5–5.0)
ALK PHOS: 110 U/L (ref 38–126)
ALT: 22 U/L (ref 14–54)
ANION GAP: 17 — AB (ref 5–15)
AST: 31 U/L (ref 15–41)
BUN: 9 mg/dL (ref 6–20)
CALCIUM: 9.6 mg/dL (ref 8.9–10.3)
CHLORIDE: 102 mmol/L (ref 101–111)
CO2: 17 mmol/L — AB (ref 22–32)
Creatinine, Ser: 0.75 mg/dL (ref 0.44–1.00)
GFR calc Af Amer: >60 mL/min (ref >60)
GFR calc non Af Amer: >60 mL/min (ref >60)
GLUCOSE: 177 mg/dL — AB (ref 65–99)
Potassium: 4 mmol/L (ref 3.5–5.1)
SODIUM: 136 mmol/L (ref 135–145)
Total Bilirubin: 0.6 mg/dL (ref 0.3–1.2)
Total Protein: 8.9 g/dL — ABNORMAL HIGH (ref 6.5–8.1)

## 2014-09-19 LAB — RAPID URINE DRUG SCREEN, HOSP PERFORMED
Amphetamines: NOT DETECTED
BARBITURATES: NOT DETECTED
BENZODIAZEPINES: NOT DETECTED
COCAINE: NOT DETECTED
OPIATES: POSITIVE — AB
Tetrahydrocannabinol: NOT DETECTED

## 2014-09-19 LAB — ACETAMINOPHEN LEVEL
Acetaminophen (Tylenol), Serum: >500 ug/mL (ref 10–30)
Acetaminophen (Tylenol), Serum: >500 ug/mL (ref 10–30)

## 2014-09-19 MED ORDER — LISDEXAMFETAMINE DIMESYLATE 30 MG PO CAPS
30.0000 mg | ORAL_CAPSULE | Freq: Every day | ORAL | Status: DC
  Filled 2014-09-19: qty 1

## 2014-09-19 MED ORDER — CHLORHEXIDINE GLUCONATE 0.12 % MT SOLN
15.0000 mL | Freq: Two times a day (BID) | OROMUCOSAL | Status: DC
  Administered 2014-09-19 – 2014-09-20 (×3): 15 mL via OROMUCOSAL
  Filled 2014-09-19 (×5): qty 15

## 2014-09-19 MED ORDER — DEXTROSE 5 % IV SOLN
15.0000 mg/kg/h | INTRAVENOUS | Status: DC
  Administered 2014-09-19 (×2): 15 mg/kg/h via INTRAVENOUS
  Filled 2014-09-19: qty 200

## 2014-09-19 MED ORDER — SODIUM CHLORIDE 0.9 % IJ SOLN
3.0000 mL | Freq: Two times a day (BID) | INTRAMUSCULAR | Status: DC
  Administered 2014-09-20 (×2): 3 mL via INTRAVENOUS

## 2014-09-19 MED ORDER — PROMETHAZINE HCL 25 MG PO TABS
12.5000 mg | ORAL_TABLET | Freq: Four times a day (QID) | ORAL | Status: DC | PRN
  Administered 2014-09-19: 12.5 mg via ORAL
  Filled 2014-09-19: qty 1

## 2014-09-19 MED ORDER — TRAZODONE HCL 50 MG PO TABS
25.0000 mg | ORAL_TABLET | Freq: Every evening | ORAL | Status: DC | PRN
  Administered 2014-09-19 – 2014-09-20 (×2): 25 mg via ORAL
  Filled 2014-09-19 (×2): qty 1

## 2014-09-19 MED ORDER — DEXTROSE 5 % IV SOLN
15.0000 mg/kg/h | INTRAVENOUS | Status: AC
  Filled 2014-09-19: qty 200

## 2014-09-19 MED ORDER — LORAZEPAM 2 MG/ML IJ SOLN
1.0000 mg | Freq: Once | INTRAMUSCULAR | Status: AC
  Administered 2014-09-19: 1 mg via INTRAVENOUS
  Filled 2014-09-19: qty 1

## 2014-09-19 MED ORDER — PROMETHAZINE HCL 25 MG/ML IJ SOLN
12.5000 mg | Freq: Once | INTRAMUSCULAR | Status: AC
  Administered 2014-09-19: 12.5 mg via INTRAVENOUS
  Filled 2014-09-19: qty 1

## 2014-09-19 MED ORDER — CHARCOAL ACTIVATED PO LIQD
50.0000 g | Freq: Once | ORAL | Status: DC
  Filled 2014-09-19: qty 240

## 2014-09-19 MED ORDER — ARIPIPRAZOLE 10 MG PO TABS
10.0000 mg | ORAL_TABLET | Freq: Every morning | ORAL | Status: DC
  Administered 2014-09-20 – 2014-09-21 (×2): 10 mg via ORAL
  Filled 2014-09-19 (×2): qty 1

## 2014-09-19 MED ORDER — SODIUM CHLORIDE 0.45 % IV SOLN
INTRAVENOUS | Status: DC
  Administered 2014-09-19: 21:00:00 via INTRAVENOUS

## 2014-09-19 MED ORDER — MORPHINE SULFATE (PF) 4 MG/ML IV SOLN
4.0000 mg | INTRAVENOUS | Status: DC | PRN
  Administered 2014-09-19 – 2014-09-20 (×3): 4 mg via INTRAVENOUS
  Filled 2014-09-19 (×3): qty 1

## 2014-09-19 MED ORDER — CHARCOAL ACTIVATED PO LIQD: 50.0000 g | Freq: Once | ORAL | Status: DC

## 2014-09-19 MED ORDER — ONDANSETRON HCL 4 MG/2ML IJ SOLN
4.0000 mg | Freq: Once | INTRAMUSCULAR | Status: AC
  Administered 2014-09-19: 4 mg via INTRAVENOUS
  Filled 2014-09-19: qty 2

## 2014-09-19 MED ORDER — DIPHENHYDRAMINE HCL 50 MG/ML IJ SOLN
25.0000 mg | Freq: Once | INTRAMUSCULAR | Status: AC
  Administered 2014-09-19: 25 mg via INTRAVENOUS
  Filled 2014-09-19: qty 1

## 2014-09-19 MED ORDER — MORPHINE SULFATE (PF) 4 MG/ML IV SOLN
4.0000 mg | Freq: Once | INTRAVENOUS | Status: AC
  Administered 2014-09-19: 4 mg via INTRAVENOUS
  Filled 2014-09-19: qty 1

## 2014-09-19 MED ORDER — PANTOPRAZOLE SODIUM 40 MG PO TBEC
40.0000 mg | DELAYED_RELEASE_TABLET | Freq: Every day | ORAL | Status: DC
  Administered 2014-09-19 – 2014-09-21 (×3): 40 mg via ORAL
  Filled 2014-09-19 (×3): qty 1

## 2014-09-19 MED ORDER — GABAPENTIN 100 MG PO CAPS
100.0000 mg | ORAL_CAPSULE | Freq: Three times a day (TID) | ORAL | Status: DC
  Administered 2014-09-19 – 2014-09-21 (×5): 100 mg via ORAL
  Filled 2014-09-19 (×7): qty 1

## 2014-09-19 MED ORDER — HEPARIN SODIUM (PORCINE) 5000 UNIT/ML IJ SOLN
5000.0000 [IU] | Freq: Three times a day (TID) | INTRAMUSCULAR | Status: DC
  Administered 2014-09-19 – 2014-09-20 (×2): 5000 [IU] via SUBCUTANEOUS
  Filled 2014-09-19 (×5): qty 1

## 2014-09-19 MED ORDER — FLUOXETINE HCL 20 MG PO CAPS
80.0000 mg | ORAL_CAPSULE | Freq: Every day | ORAL | Status: DC
  Administered 2014-09-19 – 2014-09-21 (×3): 80 mg via ORAL
  Filled 2014-09-19 (×3): qty 4

## 2014-09-19 MED ORDER — ACETYLCYSTEINE LOAD VIA INFUSION
150.0000 mg/kg | Freq: Once | INTRAVENOUS | Status: AC
  Administered 2014-09-19: 11220 mg via INTRAVENOUS
  Filled 2014-09-19: qty 281

## 2014-09-19 MED ORDER — AMITRIPTYLINE HCL 100 MG PO TABS
100.0000 mg | ORAL_TABLET | Freq: Every day | ORAL | Status: DC
  Administered 2014-09-19 – 2014-09-20 (×2): 100 mg via ORAL
  Filled 2014-09-19 (×3): qty 1

## 2014-09-19 MED ORDER — METOCLOPRAMIDE HCL 5 MG/ML IJ SOLN
10.0000 mg | Freq: Once | INTRAMUSCULAR | Status: AC
  Administered 2014-09-19: 10 mg via INTRAVENOUS
  Filled 2014-09-19: qty 2

## 2014-09-19 MED ORDER — OXYCODONE HCL ER 10 MG PO T12A
30.0000 mg | EXTENDED_RELEASE_TABLET | Freq: Two times a day (BID) | ORAL | Status: DC
  Filled 2014-09-19: qty 3

## 2014-09-19 MED ORDER — SODIUM CHLORIDE 0.9 % IV BOLUS (SEPSIS)
1000.0000 mL | Freq: Once | INTRAVENOUS | Status: AC
  Administered 2014-09-19: 1000 mL via INTRAVENOUS

## 2014-09-19 MED ORDER — PROMETHAZINE HCL 25 MG/ML IJ SOLN
12.5000 mg | Freq: Four times a day (QID) | INTRAMUSCULAR | Status: DC | PRN
  Administered 2014-09-19 – 2014-09-21 (×6): 12.5 mg via INTRAVENOUS
  Filled 2014-09-19 (×6): qty 1

## 2014-09-19 NOTE — ED Notes (Signed)
MD stated he spoke with poison control and we will wait for the 4 hr post tylenol level to see if it's decreasing before giving the mucomyst

## 2014-09-19 NOTE — ED Notes (Signed)
Pt states she has migraine.  Pt states she has taken "100 extra strength tylenol" in last hour.  Pt denies suicide.  Pt states she "doesn't feel good".  Wants to get pain meds for headache.  MD brought to bedside.

## 2014-09-19 NOTE — ED Notes (Signed)
Pt refusing charcoal.  MD and RN explained that death could occur if med not taken.  Pt asked for next of kin and gave permission for [place] to call her spouse.  Verbalization witnessed by Ramond Marrow, RN and Archivist

## 2014-09-19 NOTE — ED Notes (Signed)
Pt informed me that she did need to speak with the doctor because she stated she actually did take those medications to harm herself. MD at bedside to speak with the patient about TTS coming to see her during her admission to the hospital. Gave report to floor nurse and alerted her to situation, informed her that pt was suicidal and may need a sitter. Will transport pt to floor at 4:45

## 2014-09-19 NOTE — ED Provider Notes (Signed)
CSN: [id number]     Arrival date & time 09/19/14  1120 History   First MD Initiated Contact with Patient 09/19/14 1124     Chief Complaint  Patient presents with  . Ingestion  . Migraine     (Consider location/radiation/quality/duration/timing/severity/associated sxs/prior Treatment) HPI Comments: Took 100 500mg  tablets of tylenol 1 our ago. Denies SI at this time. States she wants this migraine to go away she's had since yesterday.  Patient is a 34 y.o. female presenting with Ingested Medication. The history is provided by the patient.  Ingestion This is a new problem. The current episode started 1 to 2 hours ago. The problem occurs constantly. The problem has not changed since onset.Associated symptoms include headaches (since yesterday). Pertinent negatives include no chest pain and no abdominal pain. Nothing aggravates the symptoms. Nothing relieves the symptoms.    Past Medical History  Diagnosis Date  . Migraine   . GERD (gastroesophageal reflux disease)     protonix evenings  . Endometriosis     chronic pain  . Anxiety     klonipin for stress disorder  . Degenerative disc disease   . Chronic back pain    Past Surgical History  Procedure Laterality Date  . Appendectomy    . Tonsillectomy    . Tubal ligation    . Laparoscopic assisted vaginal hysterectomy  08/19/2011    Procedure: LAPAROSCOPIC ASSISTED VAGINAL HYSTERECTOMY;  Surgeon: Gus Height, MD;  Location: [facility];  Service: Gynecology;  Laterality: N/A;  . Abdominal hysterectomy     Family History  Problem Relation Age of Onset  . Coronary artery disease Father   . Coronary artery disease Maternal Grandmother   . Breast cancer Mother   . Cancer Mother    Social History  Substance Use Topics  . Smoking status: Current Every Day Smoker -- 1.00 packs/day for 14 years    Types: Cigarettes  . Smokeless tobacco: None  . Alcohol Use: No   OB History    Gravida Para Term Preterm AB TAB SAB Ectopic Multiple  Living   6 2             Review of Systems  Constitutional: Negative for fever and chills.  Cardiovascular: Negative for chest pain.  Gastrointestinal: Negative for abdominal pain.  Neurological: Positive for headaches (since yesterday).  All other systems reviewed and are negative.     Allergies  Epidural tray 17gx3-1-2"; Ibuprofen; Penicillins; and Zofran  Home Medications   Prior to Admission medications   Medication Sig Start Date End Date Taking? Authorizing Provider  ABILIFY 10 MG tablet Take 10 mg by mouth every morning.  04/04/14   Historical Provider, MD  acetaminophen (TYLENOL) 500 MG tablet Take 1 tablet (500 mg total) by mouth every 6 (six) hours as needed for mild pain or headache. 07/13/13   Benjamine Mola, FNP  amitriptyline (ELAVIL) 100 MG tablet take 1-3 tablets by mouth at bedtime for depression sleep or pain 07/17/14   Historical Provider, MD  amitriptyline (ELAVIL) 25 MG tablet Take 50 mg by mouth at bedtime. 03/28/14   Historical Provider, MD  FLUoxetine (PROZAC) 40 MG capsule Take 2 capsules (80 mg total) by mouth daily. Patient not taking: Reported on 04/26/2014 07/13/13   Benjamine Mola, FNP  gabapentin (NEURONTIN) 100 MG capsule Take 1 capsule (100 mg total) by mouth 3 (three) times daily. Patient not taking: Reported on 04/26/2014 07/13/13   Benjamine Mola, FNP  OxyCODONE HCl ER (OXYCONTIN) 60 MG  T12A Take 60 mg by mouth 2 (two) times daily.    Historical Provider, MD  oxymorphone (OPANA) 10 MG tablet Take 10 mg by mouth every 3 (three) hours as needed for pain.     Historical Provider, MD  pantoprazole (PROTONIX) 40 MG tablet Take 1 tablet (40 mg total) by mouth daily. Patient not taking: Reported on 04/26/2014 07/13/13   Benjamine Mola, FNP  polyethylene glycol (MIRALAX / Floria Raveling) packet Take 17 g by mouth 2 (two) times daily as needed (constipation).     Historical Provider, MD  traZODone (DESYREL) 25 mg TABS tablet Take 0.5 tablets (25 mg total) by mouth at bedtime  as needed for sleep. Patient not taking: Reported on 04/26/2014 07/13/13   Benjamine Mola, FNP  VYVANSE 70 MG capsule take 1 capsule by mouth every morning if needed for ATTENTION 07/17/14   Historical Provider, MD   BP 130/72 mmHg  Pulse 126  Resp 20  SpO2 100%  LMP 08/10/2011 Physical Exam  Constitutional: She is oriented to person, place, and time. She appears well-developed and well-nourished. No distress.  HENT:  Head: Normocephalic and atraumatic.  Mouth/Throat: Oropharynx is clear and moist.  Eyes: EOM are normal. Pupils are equal, round, and reactive to light.  Neck: Normal range of motion. Neck supple.  Cardiovascular: Normal rate and regular rhythm.  Exam reveals no friction rub.   No murmur heard. Pulmonary/Chest: Effort normal and breath sounds normal. No respiratory distress. She has no wheezes. She has no rales.  Abdominal: Soft. She exhibits no distension. There is no tenderness. There is no rebound.  Musculoskeletal: Normal range of motion. She exhibits no edema.  Neurological: She is alert and oriented to person, place, and time.  Skin: Skin is warm. No rash noted. She is not diaphoretic.  Nursing note and vitals reviewed.   ED Course  Procedures (including critical care time) Labs Review Labs Reviewed  CBC  COMPREHENSIVE METABOLIC PANEL  ACETAMINOPHEN LEVEL  PROTIME-INR    Imaging Review No results found. I have personally reviewed and evaluated these images and lab results as part of my medical decision-making.   EKG Interpretation None      CRITICAL CARE Performed by: Osvaldo Shipper   Total critical care time: 45 minutes  Critical care time was exclusive of separately billable procedures and treating other patients.  Critical care was necessary to treat or prevent imminent or life-threatening deterioration.  Critical care was time spent personally by me on the following activities: development of treatment plan with patient and/or  surrogate as well as nursing, discussions with consultants, evaluation of patient's response to treatment, examination of patient, obtaining history from patient or surrogate, ordering and performing treatments and interventions, ordering and review of laboratory studies, ordering and review of radiographic studies, pulse oximetry and re-evaluation of patient's condition.  MDM   Final diagnoses:  Tylenol overdose, intentional self-harm, initial encounter  Suicidal ideation    23F here with tylenol ingestion. She took 100 tabs of 500 mg Tylenol roughly 1 hour ago. Did not have any Benadryl with the Tylenol. She did not want to commit suicide. She was wanting to alleviate a headache that she's had since yesterday. She does have history of headaches. She is on Opana for chronic pain. Patient here tachycardic, tearful. No abdominal pain. I spoke with was control, recommended holding off on N-acetylcysteine until we get Tylenol level back. Since she's with an hour of ingestion we will give her activated charcoal and then  check for Tylenol level at 4 hours.  Patient refused activated charcoal. Poison control said we could wait on the NAC as she's still within the window. Will wait on 4-hr level. 4 hr level >500. NAC initiated.  Patient admitted she was suicidal. IVC placed by me. Admitted.    Evelina Bucy, MD 09/19/14 [phone]

## 2014-09-19 NOTE — Progress Notes (Signed)
Utilization Review completed.  Tommye Lehenbauer RN CM  

## 2014-09-19 NOTE — H&P (Signed)
[facility] History and Physical  [name] [name] YKD:[id number] DOB: [birth year] DOA: 09/19/2014  Referring physician: Dr Mingo Amber Gabriel Cirri PCP: [name] Downing, MD   Chief Complaint: Tylenol overdose   HPI: [name] [name] is a 34 y.o. female  Patient states that she took approximately 100, 500 mg Tylenol at approximately 11 AM she states that she developed a headache this morning and simply wanted to get rid of it. She states that she continue to take Tylenol until she had a change in her symptoms. States that she started developing numbness in her tongue and an overall "strange feeling" and nausea. At which point she came to the emergency room. Headache started 1 day ago. She states that it is all over as a associate with photophobia and phonophobia. She states that this is her typical migraine presentation. She has not taken anything else for her headache outside of her typical chronic medications. Symptoms are somewhat improved with rest. Patient states she smoked a half pack of cigarettes per day. When asked directly about suicidal homicidal ideation, patient denies these. Initially patient states that she was unaware that 100 Tylenol could kill her. However when asked further about her previous admission for taking 50 Tylenol and being told that this could kill her she states that she knew that it could kill her.Patient states that she feels safe at home and denies any acute stressors that may have caused her to take medication and effort in her life. She also currently suffering from lower back pain which is a chronic ongoing condition for for which she takes multiple narcotics. Pain is nonradiating.      Review of Systems:  Constitutional:  No weight loss, night sweats, Fevers, chills, fatigue.  HEENT:  No headaches, Difficulty swallowing,Tooth/dental problems,Sore throat,  No sneezing, itching, ear ache, nasal congestion, post nasal drip,  Cardio-vascular:  No chest pain,  Orthopnea, PND, swelling in lower extremities, anasarca, dizziness, palpitations  GI: Per HPI Resp:   No shortness of breath with exertion or at rest. No excess mucus, no productive cough, No non-productive cough, No coughing up of blood.No change in color of mucus.No wheezing.No chest wall deformity  Skin:  no rash or lesions.  GU:  no dysuria, change in color of urine, no urgency or frequency. No flank pain.  Musculoskeletal:   No joint pain or swelling. No decreased range of motion. No back pain.  Psych:  states that she is at her baseline emotional and mental state. Past Medical History  Diagnosis Date  . Migraine   . GERD (gastroesophageal reflux disease)     protonix evenings  . Endometriosis     chronic pain  . Anxiety     klonipin for stress disorder  . Degenerative disc disease   . Chronic back pain    Past Surgical History  Procedure Laterality Date  . Appendectomy    . Tonsillectomy    . Tubal ligation    . Laparoscopic assisted vaginal hysterectomy  08/19/2011    Procedure: LAPAROSCOPIC ASSISTED VAGINAL HYSTERECTOMY;  Surgeon: Gus Height, MD;  Location: [facility];  Service: Gynecology;  Laterality: N/A;  . Abdominal hysterectomy     Social History:  reports that she has been smoking Cigarettes.  She has a 14 pack-year smoking history. She does not have any smokeless tobacco history on file. She reports that she does not drink alcohol or use illicit drugs.  Allergies  Allergen Reactions  . Epidural Tray 17gx3-1-2" [Nerve Block Tray] Other (See Comments)  Paralysis and severe pain in head/neck/shoulders.  No anaphylaxis.  . Ibuprofen Hives  . Penicillins Hives  . Zofran [Ondansetron Hcl] Nausea And Vomiting    Family History  Problem Relation Age of Onset  . Coronary artery disease Father   . Coronary artery disease Maternal Grandmother   . Breast cancer Mother   . Cancer Mother      Prior to Admission medications   Medication Sig Start Date End Date  Taking? Authorizing Provider  ABILIFY 10 MG tablet Take 10 mg by mouth every morning.  04/04/14  Yes Historical Provider, MD  acetaminophen (TYLENOL) 500 MG tablet Take 1 tablet (500 mg total) by mouth every 6 (six) hours as needed for mild pain or headache. 07/13/13  Yes Benjamine Mola, FNP  amitriptyline (ELAVIL) 100 MG tablet take 1-3 tablets by mouth at bedtime for depression sleep or pain 07/17/14   Historical Provider, MD  amitriptyline (ELAVIL) 25 MG tablet Take 50 mg by mouth at bedtime. 03/28/14   Historical Provider, MD  FLUoxetine (PROZAC) 40 MG capsule Take 2 capsules (80 mg total) by mouth daily. Patient not taking: Reported on 04/26/2014 07/13/13   Benjamine Mola, FNP  gabapentin (NEURONTIN) 100 MG capsule Take 1 capsule (100 mg total) by mouth 3 (three) times daily. Patient not taking: Reported on 04/26/2014 07/13/13   Benjamine Mola, FNP  OxyCODONE HCl ER (OXYCONTIN) 60 MG T12A Take 60 mg by mouth 2 (two) times daily.    Historical Provider, MD  oxymorphone (OPANA) 10 MG tablet Take 10 mg by mouth every 3 (three) hours as needed for pain.     Historical Provider, MD  pantoprazole (PROTONIX) 40 MG tablet Take 1 tablet (40 mg total) by mouth daily. Patient not taking: Reported on 04/26/2014 07/13/13   Benjamine Mola, FNP  polyethylene glycol (MIRALAX / Floria Raveling) packet Take 17 g by mouth 2 (two) times daily as needed (constipation).     Historical Provider, MD  traZODone (DESYREL) 25 mg TABS tablet Take 0.5 tablets (25 mg total) by mouth at bedtime as needed for sleep. Patient not taking: Reported on 04/26/2014 07/13/13   Benjamine Mola, FNP  VYVANSE 70 MG capsule take 1 capsule by mouth every morning if needed for ATTENTION 07/17/14   Historical Provider, MD   Physical Exam: Filed Vitals:   09/19/14 1530 09/19/14 1600 09/19/14 1700 09/19/14 1718  BP: 117/63 131/74  125/79  Pulse: 110 98  111  Temp:    97.7 F (36.5 C)  TempSrc:    Oral  Resp: 25 13  21   Height:   5\' 2"  (1.575 m)   Weight:    85 kg (187 lb 6.3 oz)   SpO2: 98% 98%  100%    Wt Readings from Last 3 Encounters:  09/19/14 85 kg (187 lb 6.3 oz)  08/13/14 74.844 kg (165 lb)  07/07/13 61.236 kg (135 lb)    General:  Appears calm and comfortable Eyes:  PERRL, normal lids, irises & conjunctiva ENT:  dry mucous membranes  Neck:  no LAD, masses or thyromegaly Cardiovascular: tachycardic RRR, no m/r/g. No LE edema. Respiratory:  CTA bilaterally, no w/r/r. Normal respiratory effort. Abdomen:  soft, ntnd Skin:  no rash or induration seen on limited exam Musculoskeletal:  grossly normal tone BUE/BLE Psychiatric: Flattened affect, patient appears to be on the verge of tears but never actually cries. Patient unwilling to discuss any reason why she may have sought her own life at this time. Normal  speech and appropriate thought content.  Neurologic:  grossly non-focal.          Labs on Admission:  Basic Metabolic Panel:  Recent Labs Lab 09/19/14 1157  NA 136  K 4.0  CL 102  CO2 17*  GLUCOSE 177*  BUN 9  CREATININE 0.75  CALCIUM 9.6   Liver Function Tests:  Recent Labs Lab 09/19/14 1157  AST 31  ALT 22  ALKPHOS 110  BILITOT 0.6  PROT 8.9*  ALBUMIN 4.5   No results for input(s): LIPASE, AMYLASE in the last 168 hours. No results for input(s): AMMONIA in the last 168 hours. CBC:  Recent Labs Lab 09/19/14 1157  WBC 17.2*  HGB 14.5  HCT 43.0  MCV 90.1  PLT 345   Cardiac Enzymes: No results for input(s): CKTOTAL, CKMB, CKMBINDEX, TROPONINI in the last 168 hours.  BNP (last 3 results) No results for input(s): BNP in the last 8760 hours.  ProBNP (last 3 results) No results for input(s): PROBNP in the last 8760 hours.  CBG: No results for input(s): GLUCAP in the last 168 hours.  Radiological Exams on Admission: No results found.   Assessment/Plan Principal Problem:   GERD (gastroesophageal reflux disease) Active Problems:   Back pain   Chronic pain disorder   MDD (major depressive  disorder)   Tylenol overdose   Suicide attempt   Tylenol overdose: Tylenol level 500 4 hrs after admission. The patient will not admit this is appears to be an intentional overdose as she has done so in the past. ED called poison control who recommended activated charcoal which pt refused due to taste. Pt was started on mucomyst in ED.  - Tele - UDS - ETOH - ASA level - Acetylcysteine infusion - CMET in am - tylenol level in the am - sitter in room  Psych: intentional Tylenol overdose as above. H/o suicide attempt, MDD, polysubstance abuse.  - Continue home Elavil, Prozac, Abilify - Psych consult in am  Chronic back pain: at baseline - continue oxycodone ER at half dose 30mg  - morphine for breakthrough - ciontinue neurontin  GERD - contineu ppi  Insomnia:  - continue trazodone  ADHD: - continue Vyvanse   Code Status: FULL DVT Prophylaxis: Hep Family Communication: NOne Disposition Plan: pending improvement and psych eval  MERRELL, DAVID Lenna Sciara, MD Family Medicine [facility] [email or web address] Password [email or web address]

## 2014-09-19 NOTE — ED Notes (Signed)
Tylenol >500 w/ dilution  Alerted primary MD

## 2014-09-20 DIAGNOSIS — T391X2A Poisoning by 4-Aminophenol derivatives, intentional self-harm, initial encounter: Principal | ICD-10-CM

## 2014-09-20 DIAGNOSIS — F322 Major depressive disorder, single episode, severe without psychotic features: Secondary | ICD-10-CM

## 2014-09-20 DIAGNOSIS — D72829 Elevated white blood cell count, unspecified: Secondary | ICD-10-CM

## 2014-09-20 DIAGNOSIS — T1491 Suicide attempt: Secondary | ICD-10-CM

## 2014-09-20 LAB — COMPREHENSIVE METABOLIC PANEL
ALBUMIN: 3.8 g/dL (ref 3.5–5.0)
ALK PHOS: 97 U/L (ref 38–126)
ALT: 43 U/L (ref 14–54)
ANION GAP: 12 (ref 5–15)
AST: 29 U/L (ref 15–41)
BUN: 6 mg/dL (ref 6–20)
CHLORIDE: 104 mmol/L (ref 101–111)
CO2: 17 mmol/L — AB (ref 22–32)
Calcium: 9 mg/dL (ref 8.9–10.3)
Creatinine, Ser: 0.58 mg/dL (ref 0.44–1.00)
GFR calc non Af Amer: >60 mL/min (ref >60)
GLUCOSE: 137 mg/dL — AB (ref 65–99)
Potassium: 3.1 mmol/L — ABNORMAL LOW (ref 3.5–5.1)
SODIUM: 133 mmol/L — AB (ref 135–145)
Total Bilirubin: 0.9 mg/dL (ref 0.3–1.2)
Total Protein: 8.1 g/dL (ref 6.5–8.1)

## 2014-09-20 LAB — CBC
HCT: 40.4 % (ref 36.0–46.0)
Hemoglobin: 13.8 g/dL (ref 12.0–15.0)
MCH: 30 pg (ref 26.0–34.0)
MCHC: 34.2 g/dL (ref 30.0–36.0)
MCV: 87.8 fL (ref 78.0–100.0)
PLATELETS: 316 10*3/uL (ref 150–400)
RBC: 4.6 MIL/uL (ref 3.87–5.11)
RDW: 13.8 % (ref 11.5–15.5)
WBC: 16.9 10*3/uL — AB (ref 4.0–10.5)

## 2014-09-20 LAB — MAGNESIUM: Magnesium: 2.1 mg/dL (ref 1.7–2.4)

## 2014-09-20 LAB — PROTIME-INR
INR: 1.1 (ref 0.00–1.49)
PROTHROMBIN TIME: 14.4 s (ref 11.6–15.2)

## 2014-09-20 LAB — ACETAMINOPHEN LEVEL: ACETAMINOPHEN (TYLENOL), SERUM: 11 ug/mL (ref 10–30)

## 2014-09-20 MED ORDER — METOCLOPRAMIDE HCL 5 MG/ML IJ SOLN
10.0000 mg | Freq: Three times a day (TID) | INTRAMUSCULAR | Status: DC
  Administered 2014-09-20 – 2014-09-21 (×3): 10 mg via INTRAVENOUS
  Filled 2014-09-20 (×6): qty 2

## 2014-09-20 MED ORDER — LORAZEPAM 2 MG/ML IJ SOLN
1.0000 mg | Freq: Once | INTRAMUSCULAR | Status: AC
  Administered 2014-09-20: 1 mg via INTRAVENOUS
  Filled 2014-09-20: qty 1

## 2014-09-20 MED ORDER — HYDROMORPHONE HCL 1 MG/ML IJ SOLN
1.0000 mg | INTRAMUSCULAR | Status: DC | PRN
  Administered 2014-09-20 (×3): 2 mg via INTRAVENOUS
  Administered 2014-09-20: 1 mg via INTRAVENOUS
  Administered 2014-09-21 (×2): 2 mg via INTRAVENOUS
  Administered 2014-09-21: 1 mg via INTRAVENOUS
  Administered 2014-09-21: 2 mg via INTRAVENOUS
  Filled 2014-09-20 (×8): qty 2

## 2014-09-20 MED ORDER — HYDROMORPHONE HCL 1 MG/ML IJ SOLN: 1.0000 mg | INTRAMUSCULAR | Status: DC | PRN

## 2014-09-20 MED ORDER — SODIUM CHLORIDE 0.9 % IV SOLN
INTRAVENOUS | Status: AC
  Administered 2014-09-20: 10:00:00 via INTRAVENOUS

## 2014-09-20 MED ORDER — METOCLOPRAMIDE HCL 5 MG/ML IJ SOLN
10.0000 mg | Freq: Once | INTRAMUSCULAR | Status: AC
  Administered 2014-09-20: 10 mg via INTRAVENOUS
  Filled 2014-09-20: qty 2

## 2014-09-20 MED ORDER — LORAZEPAM 2 MG/ML IJ SOLN
1.0000 mg | Freq: Every day | INTRAMUSCULAR | Status: DC
  Administered 2014-09-20: 1 mg via INTRAVENOUS
  Filled 2014-09-20: qty 1

## 2014-09-20 NOTE — Consult Note (Signed)
[name] [name]   Reason for [name]: depression, anxiety and tylenol overdose Referring Physician:  Dr. Elisabeth Pigeon Patient Identification: [name] [name] MRN:  [id number] Principal Diagnosis: MDD (major depressive disorder) Diagnosis:   Patient Active Problem List   Diagnosis Date Noted  . Tylenol overdose [T39.1X4A] 09/19/2014  . Suicide attempt [T14.91] 09/19/2014  . GERD (gastroesophageal reflux disease) [K21.9] 09/19/2014  . Drug dependence [F19.20]   . Polysubstance (including opioids) dependence with physiological dependence [F19.20] 07/08/2013  . MDD (major depressive disorder) [F32.2] 07/07/2013  . Suicidal ideation [R45.851] 07/06/2013  . Nausea & vomiting [R11.2] 07/06/2013  . Chronic pain disorder [G89.4] 07/06/2013  . Cocaine abuse [F14.10] 07/06/2013  . Major depression [F32.2] 01/05/2012  . Alcohol dependence [F10.20] 01/04/2012  . Opioid dependence [F11.20] 12/29/2011  . Back pain [M54.9] 12/29/2011  . N&V (nausea and vomiting) [R11.2] 06/21/2011  . Tachycardia [R00.0] 06/21/2011  . Abdominal pain [R10.9] 06/21/2011  . Leukocytosis [D72.829] 06/21/2011    Total Time spent with patient: 1 hour  Subjective:   [name] [name] is a 34 y.o. female patient admitted with intentional drug overdose.  HPI:  [name] [name] is a 34 years old female admitted to [facility] from emergency department for status post intentional drug overdose. Patient seen face-to-face for psychiatric consultation and evaluation of status post suicidal attempt along with the psychiatric social service. Patient came to the hospital saying that she was taken 100 tablets of extra Tylenol for headache which is migraine type but later she told the staff members she has intention to end her life. Patient reported she is stressed about her primary care physician a 18 years retiring and a new physician may not continue her in the outpatient care. Patient may need to find new  physician who can prescribe pain medication management, psychiatric medications and able to accept Medicaid. Patient stated that the above stress made her take intentional drug overdose. Patient has been diagnosed with a major depressive disorder and has multiple acute psychiatric hospitalization at [facility]. Patient reported she was hospitalized 5 times in the last 10 years and her recent behavioral health hospitalization was June 2015 for intentional drug overdose as a suicide attempt. Patient minimizes her suicidal attempt during this hospitalization saying that she spoke with a couple of physicians who may be able to care for her as outpatient. Patient stated that now she becomes optimistic and denies current suicidal ideations. Patient serum acetaminophen level is more than 500 on arrival which was reduced to less than 11 at this time with the current treatment. Patient didn't have screen is positive for opiates.   HPI Elements:   Location:  Depression anxiety. Quality:  Fair to poor. Severity:  Intentional overdose. Timing:  Primary care physician retiring. Duration:  One week. Context:  Psychosocial stresses and history of suicidal attempts.  Past Medical History:  Past Medical History  Diagnosis Date  . Migraine   . GERD (gastroesophageal reflux disease)     protonix evenings  . Endometriosis     chronic pain  . Anxiety     klonipin for stress disorder  . Degenerative disc disease   . Chronic back pain     Past Surgical History  Procedure Laterality Date  . Appendectomy    . Tonsillectomy    . Tubal ligation    . Laparoscopic assisted vaginal hysterectomy  08/19/2011    Procedure: LAPAROSCOPIC ASSISTED VAGINAL HYSTERECTOMY;  Surgeon: Miguel Aschoff, MD;  Location: [facility];  Service: Gynecology;  Laterality: N/A;  . Abdominal hysterectomy     Family History:  Family History  Problem Relation Age of Onset  . Coronary artery disease Father   . Coronary artery  disease Maternal Grandmother   . Breast cancer Mother   . Cancer Mother    Social History:  History  Alcohol Use No     History  Drug Use No    Comment: denies    Social History   Social History  . Marital Status: Married    Spouse Name: N/A  . Number of Children: N/A  . Years of Education: N/A   Social History Main Topics  . Smoking status: Current Every Day Smoker -- 1.00 packs/day for 14 years    Types: Cigarettes  . Smokeless tobacco: None  . Alcohol Use: No  . Drug Use: No     Comment: denies  . Sexual Activity: Yes   Other Topics Concern  . None   Social History Narrative   Additional Social History:                          Allergies:   Allergies  Allergen Reactions  . Epidural Tray 17gx3-1-2" [Nerve Block Tray] Other (See Comments)    Paralysis and severe pain in head/neck/shoulders.  No anaphylaxis.  . Ibuprofen Hives  . Penicillins Hives  . Zofran [Ondansetron Hcl] Nausea And Vomiting    Labs:  Results for orders placed or performed during the hospital encounter of 09/19/14 (from the past 48 hour(s))  CBC     Status: Abnormal   Collection Time: 09/19/14 11:57 AM  Result Value Ref Range   WBC 17.2 (H) 4.0 - 10.5 K/uL   RBC 4.77 3.87 - 5.11 MIL/uL   Hemoglobin 14.5 12.0 - 15.0 g/dL   HCT 43.0 36.0 - 46.0 %   MCV 90.1 78.0 - 100.0 fL   MCH 30.4 26.0 - 34.0 pg   MCHC 33.7 30.0 - 36.0 g/dL   RDW 13.9 11.5 - 15.5 %   Platelets 345 150 - 400 K/uL  Comprehensive metabolic panel     Status: Abnormal   Collection Time: 09/19/14 11:57 AM  Result Value Ref Range   Sodium 136 135 - 145 mmol/L   Potassium 4.0 3.5 - 5.1 mmol/L   Chloride 102 101 - 111 mmol/L   CO2 17 (L) 22 - 32 mmol/L   Glucose, Bld 177 (H) 65 - 99 mg/dL   BUN 9 6 - 20 mg/dL   Creatinine, Ser 0.75 0.44 - 1.00 mg/dL   Calcium 9.6 8.9 - 10.3 mg/dL   Total Protein 8.9 (H) 6.5 - 8.1 g/dL   Albumin 4.5 3.5 - 5.0 g/dL   AST 31 15 - 41 U/L   ALT 22 14 - 54 U/L   Alkaline  Phosphatase 110 38 - 126 U/L   Total Bilirubin 0.6 0.3 - 1.2 mg/dL   GFR calc non Af Amer >60 >60 mL/min   GFR calc Af Amer >60 >60 mL/min    Comment: (NOTE) The eGFR has been calculated using the CKD EPI equation. This calculation has not been validated in all clinical situations. eGFR's persistently <60 mL/min signify possible Chronic Kidney Disease.    Anion gap 17 (H) 5 - 15  Protime-INR     Status: None   Collection Time: 09/19/14 11:57 AM  Result Value Ref Range   Prothrombin Time 13.0 11.6 - 15.2 seconds   INR 0.96 0.00 -  1.49  Acetaminophen level     Status: Abnormal   Collection Time: 09/19/14 11:58 AM  Result Value Ref Range   Acetaminophen (Tylenol), Serum >500 (HH) 10 - 30 ug/mL    Comment: RESULTS CONFIRMED BY MANUAL DILUTION        THERAPEUTIC CONCENTRATIONS VARY SIGNIFICANTLY. A RANGE OF 10-30 ug/mL MAY BE AN EFFECTIVE CONCENTRATION FOR MANY PATIENTS. HOWEVER, SOME ARE BEST TREATED AT CONCENTRATIONS OUTSIDE THIS RANGE. ACETAMINOPHEN CONCENTRATIONS >150 ug/mL AT 4 HOURS AFTER INGESTION AND >50 ug/mL AT 12 HOURS AFTER INGESTION ARE OFTEN ASSOCIATED WITH TOXIC REACTIONS. CRITICAL RESULT CALLED TO, READ BACK BY AND VERIFIED WITH: CAMPBELL,K. RN AT 3875 09/19/14 MULLINS,T   Acetaminophen level     Status: Abnormal   Collection Time: 09/19/14  2:33 PM  Result Value Ref Range   Acetaminophen (Tylenol), Serum >500 (HH) 10 - 30 ug/mL    Comment:        THERAPEUTIC CONCENTRATIONS VARY SIGNIFICANTLY. A RANGE OF 10-30 ug/mL MAY BE AN EFFECTIVE CONCENTRATION FOR MANY PATIENTS. HOWEVER, SOME ARE BEST TREATED AT CONCENTRATIONS OUTSIDE THIS RANGE. ACETAMINOPHEN CONCENTRATIONS >150 ug/mL AT 4 HOURS AFTER INGESTION AND >50 ug/mL AT 12 HOURS AFTER INGESTION ARE OFTEN ASSOCIATED WITH TOXIC REACTIONS. RESULTS CONFIRMED BY MANUAL DILUTION CRITICAL RESULT CALLED TO, READ BACK BY AND VERIFIED WITH: CAMPBELL K RN AT 1534 ON 8.30.16 BY MENDOZA B    Urine rapid drug  screen (hosp performed)     Status: Abnormal   Collection Time: 09/19/14  6:26 PM  Result Value Ref Range   Opiates POSITIVE (A) NONE DETECTED   Cocaine NONE DETECTED NONE DETECTED   Benzodiazepines NONE DETECTED NONE DETECTED   Amphetamines NONE DETECTED NONE DETECTED   Tetrahydrocannabinol NONE DETECTED NONE DETECTED   Barbiturates NONE DETECTED NONE DETECTED    Comment:        DRUG SCREEN FOR MEDICAL PURPOSES ONLY.  IF CONFIRMATION IS NEEDED FOR ANY PURPOSE, NOTIFY LAB WITHIN 5 DAYS.        LOWEST DETECTABLE LIMITS FOR URINE DRUG SCREEN Drug Class       Cutoff (ng/mL) Amphetamine      1000 Barbiturate      200 Benzodiazepine   643 Tricyclics       329 Opiates          300 Cocaine          300 THC              50   Acetaminophen level     Status: None   Collection Time: 09/20/14  7:40 AM  Result Value Ref Range   Acetaminophen (Tylenol), Serum 11 10 - 30 ug/mL    Comment:        THERAPEUTIC CONCENTRATIONS VARY SIGNIFICANTLY. A RANGE OF 10-30 ug/mL MAY BE AN EFFECTIVE CONCENTRATION FOR MANY PATIENTS. HOWEVER, SOME ARE BEST TREATED AT CONCENTRATIONS OUTSIDE THIS RANGE. ACETAMINOPHEN CONCENTRATIONS >150 ug/mL AT 4 HOURS AFTER INGESTION AND >50 ug/mL AT 12 HOURS AFTER INGESTION ARE OFTEN ASSOCIATED WITH TOXIC REACTIONS.   CBC     Status: Abnormal   Collection Time: 09/20/14  2:00 PM  Result Value Ref Range   WBC 16.9 (H) 4.0 - 10.5 K/uL   RBC 4.60 3.87 - 5.11 MIL/uL   Hemoglobin 13.8 12.0 - 15.0 g/dL   HCT 40.4 36.0 - 46.0 %   MCV 87.8 78.0 - 100.0 fL   MCH 30.0 26.0 - 34.0 pg   MCHC 34.2 30.0 - 36.0 g/dL   RDW 13.8  11.5 - 15.5 %   Platelets 316 150 - 400 K/uL  Comprehensive metabolic panel     Status: Abnormal   Collection Time: 09/20/14  2:00 PM  Result Value Ref Range   Sodium 133 (L) 135 - 145 mmol/L   Potassium 3.1 (L) 3.5 - 5.1 mmol/L    Comment: DELTA CHECK NOTED REPEATED TO VERIFY    Chloride 104 101 - 111 mmol/L   CO2 17 (L) 22 - 32 mmol/L    Glucose, Bld 137 (H) 65 - 99 mg/dL   BUN 6 6 - 20 mg/dL   Creatinine, Ser 0.58 0.44 - 1.00 mg/dL   Calcium 9.0 8.9 - 10.3 mg/dL   Total Protein 8.1 6.5 - 8.1 g/dL   Albumin 3.8 3.5 - 5.0 g/dL   AST 29 15 - 41 U/L   ALT 43 14 - 54 U/L   Alkaline Phosphatase 97 38 - 126 U/L   Total Bilirubin 0.9 0.3 - 1.2 mg/dL   GFR calc non Af Amer >60 >60 mL/min   GFR calc Af Amer >60 >60 mL/min    Comment: (NOTE) The eGFR has been calculated using the CKD EPI equation. This calculation has not been validated in all clinical situations. eGFR's persistently <60 mL/min signify possible Chronic Kidney Disease.    Anion gap 12 5 - 15  Magnesium     Status: None   Collection Time: 09/20/14  2:00 PM  Result Value Ref Range   Magnesium 2.1 1.7 - 2.4 mg/dL  Protime-INR     Status: None   Collection Time: 09/20/14  2:00 PM  Result Value Ref Range   Prothrombin Time 14.4 11.6 - 15.2 seconds   INR 1.10 0.00 - 1.49    Vitals: Blood pressure 131/75, pulse 85, temperature 98.4 F (36.9 C), temperature source Oral, resp. rate 20, height $RemoveBe'5\' 2"'YHVvgBybQ$  (1.575 m), weight 84 kg (185 lb 3 oz), last menstrual period 08/10/2011, SpO2 100 %.  Risk to Self: Is patient at risk for suicide?: Yes (denies but took 100 tylenol for migraine - history of same) Risk to Others:   Prior Inpatient Therapy:   Prior Outpatient Therapy:    Current Facility-Administered Medications  Medication Dose Route Frequency Provider Last Rate Last Dose  . 0.9 %  sodium chloride infusion   Intravenous Continuous Robbie Lis, MD 50 mL/hr at 09/20/14 0930    . amitriptyline (ELAVIL) tablet 100 mg  100 mg Oral QHS Waldemar Dickens, MD   100 mg at 09/19/14 2108  . ARIPiprazole (ABILIFY) tablet 10 mg  10 mg Oral q morning - 10a Waldemar Dickens, MD   10 mg at 09/20/14 1007  . charcoal activated (NO SORBITOL) (ACTIDOSE-AQUA) suspension 50 g  50 g Oral Once Evelina Bucy, MD   50 g at 09/19/14 1203  . charcoal activated (NO SORBITOL) (ACTIDOSE-AQUA)  suspension 50 g  50 g Oral Once Evelina Bucy, MD   50 g at 09/19/14 1254  . chlorhexidine (PERIDEX) 0.12 % solution 15 mL  15 mL Mouth/Throat BID Waldemar Dickens, MD   15 mL at 09/20/14 [phone]  . FLUoxetine (PROZAC) capsule 80 mg  80 mg Oral Daily Waldemar Dickens, MD   80 mg at 09/20/14 0959  . gabapentin (NEURONTIN) capsule 100 mg  100 mg Oral TID Waldemar Dickens, MD   100 mg at 09/20/14 1514  . HYDROmorphone (DILAUDID) injection 1-2 mg  1-2 mg Intravenous Q4H PRN Robbie Lis, MD   2  mg at 09/20/14 1408  . lisdexamfetamine (VYVANSE) capsule 30 mg  30 mg Oral QAC breakfast Waldemar Dickens, MD   30 mg at 09/20/14 0959  . LORazepam (ATIVAN) injection 1 mg  1 mg Intravenous QHS Robbie Lis, MD      . metoCLOPramide [facility]) injection 10 mg  10 mg Intravenous 3 times per day Robbie Lis, MD   10 mg at 09/20/14 1100  . pantoprazole (PROTONIX) EC tablet 40 mg  40 mg Oral Daily Waldemar Dickens, MD   40 mg at 09/20/14 0959  . promethazine (PHENERGAN) injection 12.5 mg  12.5 mg Intravenous Q6H PRN Rhetta Mura Schorr, NP   12.5 mg at 09/20/14 1000  . promethazine (PHENERGAN) tablet 12.5 mg  12.5 mg Oral Q6H PRN Waldemar Dickens, MD   12.5 mg at 09/19/14 2109  . sodium chloride 0.9 % injection 3 mL  3 mL Intravenous Q12H Waldemar Dickens, MD   3 mL at 09/20/14 1006  . traZODone (DESYREL) tablet 25 mg  25 mg Oral QHS PRN Waldemar Dickens, MD   25 mg at 09/19/14 2108    Musculoskeletal: Strength & Muscle Tone: within normal limits Gait & Station: unable to stand Patient leans: N/A  Psychiatric Specialty Exam: Physical Exam as per history and physical   ROS complains about back pain, mild dizziness but denied nausea or vomiting diarrhea, abdominal pain, shortness of breath and chest pain No Fever-chills, No Headache, No changes with Vision or hearing, reports vertigo No problems swallowing food or Liquids, No Chest pain, Cough or Shortness of Breath, No Abdominal pain, No Nausea or Vommitting, Bowel  movements are regular, No Blood in stool or Urine, No dysuria, No new skin rashes or bruises, No new joints pains-aches,  No new weakness, tingling, numbness in any extremity, No recent weight gain or loss, No polyuria, polydypsia or polyphagia,   A full 10 point Review of Systems was done, except as stated above, all other Review of Systems were negative.  Blood pressure 131/75, pulse 85, temperature 98.4 F (36.9 C), temperature source Oral, resp. rate 20, height $RemoveBe'5\' 2"'NjevIMaAT$  (1.575 m), weight 84 kg (185 lb 3 oz), last menstrual period 08/10/2011, SpO2 100 %.Body mass index is 33.86 kg/(m^2).  General Appearance: Guarded  Eye Contact::  Good  Speech:  Clear and Coherent  Volume:  Normal  Mood:  Anxious and Depressed  Affect:  Appropriate and Congruent  Thought Process:  Coherent and Goal Directed  Orientation:  Full (Time, Place, and Person)  Thought Content:  WDL  Suicidal Thoughts:  Yes.  with intent/plan  Homicidal Thoughts:  No  Memory:  Immediate;   Fair Recent;   Fair  Judgement:  Impaired  Insight:  Shallow  Psychomotor Activity:  Normal  Concentration:  Good  Recall:  Good  Fund of Knowledge:Good  Language: Good  Akathisia:  Negative  Handed:  Right  AIMS (if indicated):     Assets:  Communication Skills Desire for Improvement Financial Resources/Insurance Housing Intimacy Leisure Time Resilience Social Support Transportation  ADL's:  Intact  Cognition: WNL  Sleep:      Medical Decision Making: Review of Psycho-Social Stressors (1), Review or order clinical lab tests (1), Established Problem, Worsening (2), Review of Last Therapy Session (1), Review or order medicine tests (1), Review of Medication Regimen & Side Effects (2) and Review of New Medication or Change in Dosage (2)  Treatment Plan Summary: patient with symptoms of depression, anxiety and status  post Tylenol overdose admitted to [facility]. Patient continued to report depression, anxiety but  minimizes her suicidal ideations at this time. Daily contact with patient to assess and evaluate symptoms and progress in treatment and Medication management  Plan:  Continue home medication  Continue safety sitter  Recommend psychiatric Inpatient admission when medically cleared. Supportive therapy provided about ongoing stressors.  Psychiatric social service contact patient family regarding collateral information  Disposition: Refer to the acute psychiatric hospitalization when medically stable   Maurica Omura,JANARDHAHA R. 09/20/2014 3:25 PM

## 2014-09-20 NOTE — Progress Notes (Signed)
Pharmacy: Re- Acetadote   Patient's a 34 y.o F admitted on 8/30 with tylenol overdose with Acetadote IV started yesterday at ~4PM.  Tylenol level came back this morning as 11 (down from >500 on 8/30).  INR 1.10 and AST/ALT 29/43.  Spoke to [employer] at [employer], she recommended to stop Acetadote.  Plan: - d/c Acetadote - pharmacy will sign off.  Re-consult [place] if need further assistance.  Dia Sitter, PharmD, BCPS 09/20/2014 3:06 PM

## 2014-09-20 NOTE — Clinical Social Work Psych Assess (Signed)
Clinical Social Work Nature conservation officer  Clinical Social Worker:  Boone Master, [place] Date/Time:  09/20/2014, 11:37 AM Referred By:  Physician Date Referred:  09/20/14 Reason for Referral:  Behavioral Health Issues   Presenting Symptoms/Problems  Presenting Symptoms/Problems(in person's/family's own words):  Patient reports she took about 100 Tylenol pills.   Abuse/Neglect/Trauma History  Abuse/Neglect/Trauma History:  Denies History Abuse/Neglect/Trauma History Comments (indicate dates):  N/A   Psychiatric History  Psychiatric History:  Inpatient/Hospitalization, Outpatient Treatment Psychiatric Medication:  Prozac, Abilify, Elavil   Current Mental Health Hospitalizations/Previous Mental Health History:  Patient reports she was diagnosed with depression when she was 9 or 34 years old.   Current Provider:  None currently. Place and Date:  N/A  Current Medications:   Scheduled Meds: . amitriptyline  100 mg Oral QHS  . ARIPiprazole  10 mg Oral q morning - 10a  . charcoal activated (NO SORBITOL)  50 g Oral Once  . charcoal activated (NO SORBITOL)  50 g Oral Once  . chlorhexidine  15 mL Mouth/Throat BID  . FLUoxetine  80 mg Oral Daily  . gabapentin  100 mg Oral TID  . lisdexamfetamine  30 mg Oral QAC breakfast  . LORazepam  1 mg Intravenous QHS  . metoCLOPramide (REGLAN) injection  10 mg Intravenous 3 times per day  . pantoprazole  40 mg Oral Daily  . sodium chloride  3 mL Intravenous Q12H   Continuous Infusions: . sodium chloride 50 mL/hr at 09/20/14 0930  . acetylcysteine 15 mg/kg/hr (09/19/14 2206)   PRN Meds:.HYDROmorphone (DILAUDID) injection, promethazine, promethazine, traZODone     Previous Inpatient Admission/Date/Reason:  Patient reports 3 admissions to [facility]. 2 admissions for detox and 1 admission for SI. Patient reports last hospitalization was last year.   Emotional Health/Current Symptoms  Suicide/Self Harm: Suicide Attempt in  the Past (date/description), Suicidal Ideation (ex. "I can't take anymore, I wish I could disappear") Suicide Attempt in Past (date/description):  Patient had SI about 1 year ago but reports no previous attempts. Patient admitted after overdosing on 100 Tylenol pills in attempt to kill herself.  Other Harmful Behavior (ex. homicidal ideation) (describe):  None reported   Psychotic/Dissociative Symptoms  Psychotic/Dissociative Symptoms: Visual Hallucinations, Auditory Hallucinations Other Psychotic/Dissociative Symptoms:  Patient reports she sees shadows and sometimes hears breathing noises or growling. Patient reports AHV do not happen often.    Attention/Behavioral Symptoms  Attention/Behavioral Symptoms: Within Normal Limits Other Attention/Behavioral Symptoms:  Patient engaged during assessment.   Cognitive Impairment  Cognitive Impairment:  Within Normal Limits Other Cognitive Impairment:  Patient alert and oriented.   Mood and Adjustment  Mood and Adjustment:  Flat   Stress, Anxiety, Trauma, Any Recent Loss/Stressor  Stress, Anxiety, Trauma, Any Recent Loss/Stressor: None Reported Anxiety (frequency):  N/A  Phobia (specify):  N/A  Compulsive Behavior (specify):  N/A  Obsessive Behavior (specify):  N/A  Other Stress, Anxiety, Trauma, Any Recent Loss/Stressor:  Patient reports strained relationship with 36 year old dtr.   Substance Abuse/Use  Substance Abuse/Use: History of Substance Use SBIRT Completed (please refer for detailed history): No Self-reported Substance Use (last use and frequency):  Patient reports she used to abuse pain medication in the past but reports she is compliant with pain medication now. Patient reports if pain increases during the day she might take an extra pill. Patient reports she overdosed on Tylenol because she ran out of pain medication. Patient denies abusing any pain medication lately but reports that she accidentally lost pill bottle  while at the pool.  Patient has been to [facility] twice in the past for detox and has gone to [facility] for Methadone and [facility] for Suboxone.   Urinary Drug Screen Completed: Yes Alcohol Level:  N/A   Environment/Housing/Living Arrangement  Environmental/Housing/Living Arrangement: Stable Housing Who is in the Home:  Husband, 2 children (age 60 and 13) and mother-in-law and 6 year old dtr stays occassionally  Emergency Contact:  Keith-husband   Financial  Financial: Medicaid   Patient's Strengths and Goals  Patient's Strengths and Goals (patient's own words):  Patient reports supportive family.    Clinical Social Worker's Interpretive Summary  Clinical Social Workers Interpretive Summary:    CSW received referral to complete psychosocial assessment. CSW reviewed chart and met with patient at bedside. CSW introduced myself and explained role.  Patient reports she lives at home with family. Patient is not currently working and has a hearing for disability on September 12th to appeal disability denial. Patient was diagnosed with degenerative disc disease and reports due to back pain she can no longer work. Patient lives at home with family but reports that she and dtr have not been getting along. Dtr is 61 years old and moved out of the house to live with boyfriend. Dtr has since moved back in with family but does not follow family rules and patient reports "she treats my house like a hotel." Patient and husband have been arguing about dtr's habits as well.  Patient initally reports that she ran out of pain medication and took Tylenol due to migraine. Patient reports chronic back pain but DDD was not diagnosed until recently. Patient had started taking pain medication to deal with pain but has used Methadone and Suboxone as well. Patient stopped taking pain medication and started Methadone between 2008-2012 when she was pregnant with her sons. Patient then switched to  Suboxone from 2012-2014. Patient is currently taking 60 mg Oxycodone twice daily and 10 mg Opana three times a day. Patient's PCP (Dr. Claris Gower) is prescribing pain medication and psychotropic medications. Patient's PCP plans to retire next month and patient is interested in following up at a pain clinic. Patient inquiring if psych MD or attending MD would prescribe her Suboxone. Patient does admit to taking extra pain medication but reports that she ran out of pain medication this week because she lost her pill bottle.   Patient reports that she had been feeling depressed and felt worthless after arguing with dtr. After CSW confronted patient re: taking medications for pain vs. Suicide attempt, patient reports she was trying to kill herself but denies any current SI. Patient reports that husband and dtr were home and she went to the bathroom and consumed around 100 pills all at once. Patient told her dtr about the overdose and dtr drove her to the hospital. No CPS report warranted at this time as patient reports that 52 and 30 year old children were at school when overdose occurred. Patient went to [facility] last year for SI and was supposed to follow up with [facility] but decided not to return. Patient reports she is compliant with psychotropic medications but that she has not had any counseling or seen a psychiatrist in over a year.  Patient aware of IVC status and that psych MD will evaluate patient as well.  CSW will continue to follow.   Disposition  Disposition: Recommend Psych CSW Continuing To Support While In [facility] [place]

## 2014-09-20 NOTE — Progress Notes (Signed)
Patient ID: [name] [name], female   DOB: 1980/11/08, 34 y.o.   MRN: [id number] [facility] PROGRESS NOTE  [name] [name] SAY:[id number] DOB: 1980-05-31 DOA: 09/19/2014 PCP: [name], MD  Brief narrative:    34 y.o. female with past medical history of depression, chronic pain. Patient presented to [facility] long hospital after she took approximately 100 of Tylenol 500 mg tablets around 11 AM  Prior to the admission. Patient apparently has migraine headaches and was taking Tylenol for pain relief. She denied suicidal ideation and suicidal attempt at the time of the admission. Patient was placed on acetylcysteine and was given activated charcoal. Psych was consulted for input on depression and suicidal attempt. Even though patient has denied suicidal ideation at the time of the admission is not entirely clear why she thought that that it would be safe to take so much Tylenol for pain relief.  Anticipated discharge: Likely in the next 1-2 days if nausea improves.  Assessment/Plan:    Principal Problem: Intentional overdose / Tylenol overdose / suicidal attempt - Even though patient denies suicidal attempt it is not really clear why which she takes 100 pills of Tylenol to help with pain relief. - Tylenol level on the admission was more than 500. Tylenol level this morning is within normal limits. - Liver function enzymes are within normal limits. - Urine drug screen positive for opiates - She has received activated charcoal and was placed on acetylcysteine. - Psych consulted for suicidal attempt and Tylenol overdose  Active Problems:   MDD (major depressive disorder) - Continue Elavil, Abilify, Prozac - Ativan 1 mg at bedtime for sleep   Nausea - Likely related to Tylenol overdose - Advanced diet this morning from clear liquids to regular - Continue antiemetics as needed - Continue IV fluids for next 24 hours until by mouth intake better    Leukocytosis - Likely reactive. -  No reports of fevers. No evidence of acute infection.   DVT Prophylaxis  - SCD's bilaterally    Code Status: Full.  Family Communication:  plan of care discussed with the patient Disposition Plan: needs psych evaluation   IV access:  Peripheral IV  Procedures and diagnostic studies:    No results found.  Medical Consultants:  Psychiatry  Other Consultants:  None   IAnti-Infectives:   None    Leisa Lenz, MD  [facility] Pager [phone]  Time spent in minutes: 25 minutes  If 7PM-7AM, please contact night-coverage [email or web address] Password [email or web address] 09/20/2014, 12:06 PM   LOS: 1 day    HPI/Subjective: No acute overnight events. Patient reports nausea this am. No reports of vomiting.   Objective: Filed Vitals:   09/19/14 1718 09/19/14 2100 09/19/14 2213 09/20/14 0547  BP: 125/79  125/72 103/45  Pulse: 111  80 85  Temp: 97.7 F (36.5 C)  98.5 F (36.9 C) 99.3 F (37.4 C)  TempSrc: Oral  Oral Oral  Resp: 21  20 20   Height:  5\' 2"  (1.575 m)    Weight:  84 kg (185 lb 3 oz)    SpO2: 100%  100% 100%    Intake/Output Summary (Last 24 hours) at 09/20/14 1206 Last data filed at 09/20/14 1055  Gross per 24 hour  Intake 1580.25 ml  Output   1350 ml  Net 230.25 ml    Exam:   General:  Pt is alert, follows commands appropriately, not in acute distress  Cardiovascular: Regular rate and rhythm, S1/S2, no murmurs  Respiratory: Clear to auscultation bilaterally, no wheezing,  no crackles, no rhonchi  Abdomen: Soft, non tender, non distended, bowel sounds present  Extremities: No edema, pulses DP and PT palpable bilaterally  Neuro: Grossly nonfocal  Data Reviewed: Basic Metabolic Panel:  Recent Labs Lab 09/19/14 1157  NA 136  K 4.0  CL 102  CO2 17*  GLUCOSE 177*  BUN 9  CREATININE 0.75  CALCIUM 9.6   Liver Function Tests:  Recent Labs Lab 09/19/14 1157  AST 31  ALT 22  ALKPHOS 110  BILITOT 0.6  PROT 8.9*  ALBUMIN 4.5   No results  for input(s): LIPASE, AMYLASE in the last 168 hours. No results for input(s): AMMONIA in the last 168 hours. CBC:  Recent Labs Lab 09/19/14 1157  WBC 17.2*  HGB 14.5  HCT 43.0  MCV 90.1  PLT 345   Cardiac Enzymes: No results for input(s): CKTOTAL, CKMB, CKMBINDEX, TROPONINI in the last 168 hours. BNP: Invalid input(s): POCBNP CBG: No results for input(s): GLUCAP in the last 168 hours.  No results found for this or any previous visit (from the past 240 hour(s)).   Scheduled Meds: . amitriptyline  100 mg Oral QHS  . ARIPiprazole  10 mg Oral q morning - 10a  . charcoal activated (NO SORBITOL)  50 g Oral Once  . charcoal activated (NO SORBITOL)  50 g Oral Once  . chlorhexidine  15 mL Mouth/Throat BID  . FLUoxetine  80 mg Oral Daily  . gabapentin  100 mg Oral TID  . lisdexamfetamine  30 mg Oral QAC breakfast  . LORazepam  1 mg Intravenous QHS  . metoCLOPramide (REGLAN) injection  10 mg Intravenous 3 times per day  . pantoprazole  40 mg Oral Daily   Continuous Infusions: . sodium chloride 50 mL/hr at 09/20/14 0930  . acetylcysteine 15 mg/kg/hr (09/19/14 2206)

## 2014-09-21 ENCOUNTER — Encounter (HOSPITAL_COMMUNITY): Payer: Self-pay | Admitting: *Deleted

## 2014-09-21 ENCOUNTER — Inpatient Hospital Stay (HOSPITAL_COMMUNITY)
Admission: AD | Admit: 2014-09-21 | Discharge: 2014-09-27 | DRG: 885 | Disposition: A | Discharge status: Home / Self Care | Payer: Medicaid Other | Source: Intra-hospital | Attending: Emergency Medicine | Admitting: Emergency Medicine

## 2014-09-21 DIAGNOSIS — F1120 Opioid dependence, uncomplicated: Secondary | ICD-10-CM | POA: Diagnosis present

## 2014-09-21 DIAGNOSIS — Z803 Family history of malignant neoplasm of breast: Secondary | ICD-10-CM

## 2014-09-21 DIAGNOSIS — K5900 Constipation, unspecified: Secondary | ICD-10-CM | POA: Diagnosis present

## 2014-09-21 DIAGNOSIS — F17210 Nicotine dependence, cigarettes, uncomplicated: Secondary | ICD-10-CM | POA: Diagnosis present

## 2014-09-21 DIAGNOSIS — Z8249 Family history of ischemic heart disease and other diseases of the circulatory system: Secondary | ICD-10-CM | POA: Diagnosis not present

## 2014-09-21 DIAGNOSIS — T182XXA Foreign body in stomach, initial encounter: Secondary | ICD-10-CM | POA: Diagnosis present

## 2014-09-21 DIAGNOSIS — E876 Hypokalemia: Secondary | ICD-10-CM | POA: Diagnosis not present

## 2014-09-21 DIAGNOSIS — T189XXA Foreign body of alimentary tract, part unspecified, initial encounter: Secondary | ICD-10-CM

## 2014-09-21 DIAGNOSIS — F332 Major depressive disorder, recurrent severe without psychotic features: Secondary | ICD-10-CM | POA: Diagnosis present

## 2014-09-21 DIAGNOSIS — K219 Gastro-esophageal reflux disease without esophagitis: Secondary | ICD-10-CM | POA: Diagnosis present

## 2014-09-21 DIAGNOSIS — T1491 Suicide attempt: Secondary | ICD-10-CM | POA: Diagnosis not present

## 2014-09-21 DIAGNOSIS — T391X2A Poisoning by 4-Aminophenol derivatives, intentional self-harm, initial encounter: Secondary | ICD-10-CM | POA: Diagnosis not present

## 2014-09-21 DIAGNOSIS — M795 Residual foreign body in soft tissue: Secondary | ICD-10-CM

## 2014-09-21 DIAGNOSIS — F329 Major depressive disorder, single episode, unspecified: Secondary | ICD-10-CM | POA: Diagnosis present

## 2014-09-21 DIAGNOSIS — X58XXXA Exposure to other specified factors, initial encounter: Secondary | ICD-10-CM | POA: Diagnosis present

## 2014-09-21 LAB — BASIC METABOLIC PANEL
Anion gap: 7 (ref 5–15)
BUN: <5 mg/dL — ABNORMAL LOW (ref 6–20)
CALCIUM: 8.8 mg/dL — AB (ref 8.9–10.3)
CHLORIDE: 107 mmol/L (ref 101–111)
CO2: 19 mmol/L — ABNORMAL LOW (ref 22–32)
CREATININE: 0.61 mg/dL (ref 0.44–1.00)
Glucose, Bld: 141 mg/dL — ABNORMAL HIGH (ref 65–99)
Potassium: 3.1 mmol/L — ABNORMAL LOW (ref 3.5–5.1)
SODIUM: 133 mmol/L — AB (ref 135–145)

## 2014-09-21 MED ORDER — MAGNESIUM HYDROXIDE 400 MG/5ML PO SUSP: 30.0000 mL | Freq: Every day | ORAL | Status: DC | PRN

## 2014-09-21 MED ORDER — ACYCLOVIR 5 % EX OINT
TOPICAL_OINTMENT | CUTANEOUS | Status: DC
  Administered 2014-09-21 – 2014-09-25 (×26): via TOPICAL
  Filled 2014-09-21 (×3): qty 30

## 2014-09-21 MED ORDER — ALUM & MAG HYDROXIDE-SIMETH 200-200-20 MG/5ML PO SUSP
30.0000 mL | ORAL | Status: DC | PRN
  Administered 2014-09-24: 30 mL via ORAL
  Filled 2014-09-21: qty 30

## 2014-09-21 MED ORDER — POTASSIUM CHLORIDE ER 20 MEQ PO TBCR: 20.0000 meq | EXTENDED_RELEASE_TABLET | Freq: Every day | ORAL | Status: DC

## 2014-09-21 MED ORDER — PNEUMOCOCCAL VAC POLYVALENT 25 MCG/0.5ML IJ INJ
0.5000 mL | INJECTION | INTRAMUSCULAR | Status: AC
  Administered 2014-09-22: 0.5 mL via INTRAMUSCULAR

## 2014-09-21 MED ORDER — HYDROXYZINE HCL 25 MG PO TABS
25.0000 mg | ORAL_TABLET | Freq: Every evening | ORAL | Status: DC | PRN
  Filled 2014-09-21: qty 1

## 2014-09-21 MED ORDER — ACETAMINOPHEN 325 MG PO TABS
650.0000 mg | ORAL_TABLET | Freq: Four times a day (QID) | ORAL | Status: DC | PRN
  Administered 2014-09-23 – 2014-09-25 (×2): 650 mg via ORAL
  Filled 2014-09-21 (×2): qty 2

## 2014-09-21 MED ORDER — INFLUENZA VAC SPLIT QUAD 0.5 ML IM SUSY
0.5000 mL | PREFILLED_SYRINGE | INTRAMUSCULAR | Status: AC
  Administered 2014-09-22: 0.5 mL via INTRAMUSCULAR
  Filled 2014-09-21: qty 0.5

## 2014-09-21 MED ORDER — OXYCODONE HCL ER 40 MG PO T12A
60.0000 mg | EXTENDED_RELEASE_TABLET | Freq: Two times a day (BID) | ORAL | Status: DC
  Administered 2014-09-21 – 2014-09-22 (×2): 60 mg via ORAL
  Filled 2014-09-21 (×4): qty 1

## 2014-09-21 MED ORDER — OXYCODONE HCL 5 MG PO TABS
10.0000 mg | ORAL_TABLET | Freq: Three times a day (TID) | ORAL | Status: AC | PRN
  Administered 2014-09-22: 10 mg via ORAL
  Filled 2014-09-21: qty 2

## 2014-09-21 MED ORDER — OXYCODONE HCL 5 MG PO TABS
10.0000 mg | ORAL_TABLET | Freq: Three times a day (TID) | ORAL | Status: AC | PRN
  Administered 2014-09-21: 10 mg via ORAL
  Filled 2014-09-21: qty 2

## 2014-09-21 MED ORDER — AMITRIPTYLINE HCL 100 MG PO TABS
100.0000 mg | ORAL_TABLET | Freq: Every day | ORAL | Status: DC
  Administered 2014-09-21: 100 mg via ORAL
  Filled 2014-09-21 (×2): qty 1
  Filled 2014-09-21: qty 4

## 2014-09-21 NOTE — Progress Notes (Signed)
Report called to [facility]. Pt VSS, IVs removed. [facility] to transport.

## 2014-09-21 NOTE — Progress Notes (Signed)
Adult Psychoeducational Group Note  Date:  09/21/2014 Time:  10:13 PM  Group Topic/Focus:  Wrap-Up Group:   The focus of this group is to help patients review their daily goal of treatment and discuss progress on daily workbooks.  Participation Level:  Did Not Attend  Participation Quality:  Did not attend  Affect:  Did not attend  Cognitive:  Did not attend  Insight: None  Engagement in Group:  Did not attend  Modes of Intervention:  Did not attend  Additional Comments:  Patient did not attend wrap up group tonight.   Marne Meline L Marshaun Lortie 09/21/2014, 10:13 PM

## 2014-09-21 NOTE — Tx Team (Signed)
Initial Interdisciplinary Treatment Plan   PATIENT STRESSORS: Health problems Legal issue Medication change or noncompliance Substance abuse   PATIENT STRENGTHS: Ability for insight Active sense of humor Average or above average intelligence   PROBLEM LIST: Problem List/Patient Goals Date to be addressed Date deferred Reason deferred Estimated date of resolution  Suicidal Ideation 2' Opioid Dependence 09/21/2014     S/p Tylenol OD 09/21/2014     GERD 09/21/2014     " I don't know what Im going to do" 09/21/2014     " Im not an alcoholic" 22/63/335                              DISCHARGE CRITERIA:  Reduction of life-threatening or endangering symptoms to within safe limits Safe-care adequate arrangements made Verbal commitment to aftercare and medication compliance Withdrawal symptoms are absent or subacute and managed without 24-hour nursing intervention  PRELIMINARY DISCHARGE PLAN: Attend aftercare/continuing care group Attend PHP/IOP Attend 12-step recovery group Outpatient therapy  PATIENT/FAMIILY INVOLVEMENT: This treatment plan has been presented to and reviewed with the patient, [name] [name], and/or family member, .  The patient and family have been given the opportunity to ask questions and make suggestions.  [name] 09/21/2014, 2:38 PM

## 2014-09-21 NOTE — Discharge Instructions (Addendum)
Activated Charcoal Activated charcoal is a mixture of very fine charcoal particles that absorb and trap poisons or drugs and prevent them from passing into your blood. Activated charcoal is usually given once. It may also be mixed with a medicine (laxative)to help you have a bowel movement. This helps move the toxins through the gut quickly. The activated charcoal may upset your stomach, cause vomiting, diarrhea or even constipation. Your stool (feces) will usually be black for 1 to 2 days. Call your caregiver if you have any concerns about your treatment. Document Released: 02/14/2004 Document Revised: 03/31/2011 Document Reviewed: 11/03/2008 [facility] Patient Information 2015 [place], [place]. This information is not intended to replace advice given to you by your health care provider. Make sure you discuss any questions you have with your health care provider.  Depression Depression is feeling sad, low, down in the dumps, blue, gloomy, or empty. In general, there are two kinds of depression:  Normal sadness or grief. This can happen after something upsetting. It often goes away on its own within 2 weeks. After losing a loved one (bereavement), normal sadness and grief may last longer than two weeks. It usually gets better with time.  Clinical depression. This kind lasts longer than normal sadness or grief. It keeps you from doing the things you normally do in life. It is often hard to function at home, work, or at school. It may affect your relationships with others. Treatment is often needed. GET HELP RIGHT AWAY IF:  You have thoughts about hurting yourself or others.  You lose touch with reality (psychotic symptoms). You may:  See or hear things that are not real.  Have untrue beliefs about your life or people around you.  Your medicine is giving you problems. MAKE SURE YOU:  Understand these instructions.  Will watch your condition.  Will get help right away if you are not doing well  or get worse. Document Released: 02/08/2010 Document Revised: 05/23/2013 Document Reviewed: 05/08/2011 [facility] Patient Information 2015 [place], [place]. This information is not intended to replace advice given to you by your health care provider. Make sure you discuss any questions you have with your health care provider.

## 2014-09-21 NOTE — Progress Notes (Signed)
Patient ID: [name] [name], female   DOB: 17-Dec-1980, 34 y.o.   MRN: [id number] D: Client in room most of this shift, up for medication, reports day been "so so" c/o back pain "8" of 10. A: Writer provided emotional support, reviewed medication, administered as prescribed. Staff will monitor q58min for safety. R: Clients is safe on the unit, did not attend group.

## 2014-09-21 NOTE — Progress Notes (Signed)
Clinical Social Work  Patient accepted to [facility] [id number]. RN to call report to [phone]. IVC forms faxed to [facility] who reviewed and agreeable to accept patient. Patient informed of DC plans and reports CSW does not need to contact anyone because she will call family. Patient aware of IVC and that [employer] will transport.  [employer] transportation arranged. CSW is signing off but available if needed  Sindy Messing, [place] [phone]

## 2014-09-21 NOTE — Discharge Summary (Signed)
Physician Discharge Summary  [name] [name]:[id number] DOB: 07-25-1980 DOA: 09/19/2014  PCP: [name], MD  Admit date: 09/19/2014 Discharge date: 09/21/2014  Recommendations for Outpatient Follow-up:  1. Continue potassium 20 mEq for 7 days on discharge.  Discharge Diagnoses:  Principal Problem:   MDD (major depressive disorder) Active Problems:   Back pain   Chronic pain disorder   Tylenol overdose   Suicide attempt   GERD (gastroesophageal reflux disease)    Discharge Condition: stable   Diet recommendation: as tolerated   History of present illness:  34 y.o. female with past medical history of depression, chronic pain. Patient presented to [facility] long hospital after she took approximately 100 of Tylenol 500 mg tablets around 11 AM Prior to the admission. Patient apparently has migraine headaches and was taking Tylenol for pain relief. She denied suicidal ideation and suicidal attempt at the time of the admission. Patient was placed on acetylcysteine and was given activated charcoal. Psych was consulted for input on depression and suicidal attempt. Even though patient has denied suicidal ideation at the time of the admission is not entirely clear why she thought that that it would be safe to take so much Tylenol for pain relief. Per psychiatry, patient will benefit from inpatient treatment in psychiatric facility.   Hospital Course:    Assessment/Plan:    Principal Problem: Intentional overdose / Tylenol overdose / suicidal attempt - Obvious concern that patient has attempted suicide with overdose with Tylenol. She took about 100 pills of Tylenol to help with pain relief - Tylenol level on the admission was more than 500. Tylenol level normalized with activated charcoal and acetylcysteine - Liver function enzymes are within normal limits. - Urine drug screen positive for opiates - Psych consulted for suicidal attempt and Tylenol overdose; recommends  inpatient psychiatry on discharge  Active Problems:  MDD (major depressive disorder) - Continue Elavil, Abilify, Prozac  Nausea - Likely related to Tylenol overdose - We advanced diet 09/20/2014. She tolerated it well. - Stop IV fluids  - No reports of nausea or vomiting in past 24 hours   Leukocytosis - Likely reactive. - No reports of fevers. No evidence of acute infection.    Hypokalemia - Likely from GI losses. Being supplemented. - Patient will take potassium 20 mEq for 7 days on discharge.   DVT Prophylaxis  - SCD's bilaterally    Code Status: Full.  Family Communication: plan of care discussed with the patient   IV access:  Peripheral IV  Procedures and diagnostic studies:   No results found.  Medical Consultants:  Psychiatry  Other Consultants:  None   IAnti-Infectives:   None    Signed:  Leisa Lenz, MD  [facility] 09/21/2014, 10:12 AM  Pager #: [phone]  Time spent in minutes: more than 30 minutes   Discharge Exam: Filed Vitals:   09/21/14 0634  BP: 116/54  Pulse: 81  Temp: 98.2 F (36.8 C)  Resp: 18   Filed Vitals:   09/20/14 0547 09/20/14 1413 09/20/14 2142 09/21/14 0634  BP: 103/45 131/75 121/70 116/54  Pulse: 85 85 91 81  Temp: 99.3 F (37.4 C) 98.4 F (36.9 C) 98.7 F (37.1 C) 98.2 F (36.8 C)  TempSrc: Oral Oral Oral Oral  Resp: 20 20 18 18   Height:      Weight:      SpO2: 100% 100% 98% 98%    General: Pt is alert, follows commands appropriately, not in acute distress Cardiovascular: Regular rate and rhythm, S1/S2 +,  no murmurs Respiratory: Clear to auscultation bilaterally, no wheezing, no crackles, no rhonchi Abdominal: Soft, non tender, non distended, bowel sounds +, no guarding Extremities: no edema, no cyanosis, pulses palpable bilaterally DP and PT Neuro: Grossly nonfocal  Discharge Instructions  Discharge Instructions    Call MD for:  difficulty breathing, headache or  visual disturbances    Complete by:  As directed      Call MD for:  persistant nausea and vomiting    Complete by:  As directed      Call MD for:  severe uncontrolled pain    Complete by:  As directed      Diet - low sodium heart healthy    Complete by:  As directed      Increase activity slowly    Complete by:  As directed             Medication List    STOP taking these medications        acetaminophen 500 MG tablet  Commonly known as:  TYLENOL     oxymorphone 10 MG tablet  Commonly known as:  OPANA     traZODone 25 mg Tabs tablet  Commonly known as:  DESYREL      TAKE these medications        ABILIFY 10 MG tablet  Generic drug:  ARIPiprazole  Take 10 mg by mouth every morning.     amitriptyline 100 MG tablet  Commonly known as:  ELAVIL  take 1-3 tablets by mouth at bedtime for depression sleep or pain     FLUoxetine 40 MG capsule  Commonly known as:  PROZAC  Take 2 capsules (80 mg total) by mouth daily.     gabapentin 100 MG capsule  Commonly known as:  NEURONTIN  Take 1 capsule (100 mg total) by mouth 3 (three) times daily.     OXYCONTIN 60 MG T12a  Generic drug:  OxyCODONE HCl ER  Take 60 mg by mouth 2 (two) times daily.     pantoprazole 40 MG tablet  Commonly known as:  PROTONIX  Take 1 tablet (40 mg total) by mouth daily.     polyethylene glycol packet  Commonly known as:  MIRALAX / GLYCOLAX  Take 17 g by mouth 2 (two) times daily as needed (constipation).     Potassium Chloride ER 20 MEQ Tbcr  Take 20 mEq by mouth daily.     VYVANSE 70 MG capsule  Generic drug:  lisdexamfetamine  Take 70 mg by mouth once daily            Follow-up Information    Follow up with [name], MD. Schedule an appointment as soon as possible for a visit in 1 week.   Specialty:  Family Medicine   Why:  Follow up appt after recent hospitalization   Contact information:   [place] [address] [phone]        The  results of significant diagnostics from this hospitalization (including imaging, microbiology, ancillary and laboratory) are listed below for reference.    Significant Diagnostic Studies: No results found.  Microbiology: No results found for this or any previous visit (from the past 240 hour(s)).   Labs: Basic Metabolic Panel:  Recent Labs Lab 09/19/14 1157 09/20/14 1400 09/21/14 0820  NA 136 133* 133*  K 4.0 3.1* 3.1*  CL 102 104 107  CO2 17* 17* 19*  GLUCOSE 177* 137* 141*  BUN 9 6 <5*  CREATININE 0.75  0.58 0.61  CALCIUM 9.6 9.0 8.8*  MG  --  2.1  --    Liver Function Tests:  Recent Labs Lab 09/19/14 1157 09/20/14 1400  AST 31 29  ALT 22 43  ALKPHOS 110 97  BILITOT 0.6 0.9  PROT 8.9* 8.1  ALBUMIN 4.5 3.8   No results for input(s): LIPASE, AMYLASE in the last 168 hours. No results for input(s): AMMONIA in the last 168 hours. CBC:  Recent Labs Lab 09/19/14 1157 09/20/14 1400  WBC 17.2* 16.9*  HGB 14.5 13.8  HCT 43.0 40.4  MCV 90.1 87.8  PLT 345 316   Cardiac Enzymes: No results for input(s): CKTOTAL, CKMB, CKMBINDEX, TROPONINI in the last 168 hours. BNP: BNP (last 3 results) No results for input(s): BNP in the last 8760 hours.  ProBNP (last 3 results) No results for input(s): PROBNP in the last 8760 hours.  CBG: No results for input(s): GLUCAP in the last 168 hours.

## 2014-09-21 NOTE — Progress Notes (Signed)
Late entry for 1930  [name] [name] is a 34 yo caucasian  Female who was taken to [facility] ( after EMS was called) when she took an estimated 100 tylenol pm, she says ' I just wanted to get to sleep". She shares her history as follows: she has chronic and acute back pain from " 3 bulging discs" and she sees a [name] [name] in [place], who orders her oxycontin 60 mg bid and opana 10mg  q8hrs. She vehemently denies having any problems with alcohol...or  " any other" illegal Drug and says " I hate alcohol". She says her husband, with whom she lives is very supportive and healps her take care of their 3 children, a motivating factor for her to stay alive. She is upset when she found out her physician would not be [name] [name] and inquires how she can change doctors. SHe says the stressor of her overdose....was that her PCP ( [name] [name]) recently told her he was retiring and that he has been her doctor " for 18 years" and that she is unsure how soon she can get into a pain clinic and she is afraid she will have to go without her pain medicine. She At the time of her admission, she is flat, tearful, depressed, demonstrates minimal insight into the seriousness of her overdose and says " do you know when I can get my pain medicine?". She contracts for safety, states she is here only until " I get straightened out" and  Has minimal input in the admissison process, other than inquiiring many itmes ( at least four) when she will receive her pain medication. After admission is complete and she was oriented tot he unit, she was taken to her room and orders obtained.

## 2014-09-22 ENCOUNTER — Encounter (HOSPITAL_COMMUNITY): Payer: Self-pay | Admitting: Psychiatry

## 2014-09-22 DIAGNOSIS — F332 Major depressive disorder, recurrent severe without psychotic features: Secondary | ICD-10-CM | POA: Diagnosis present

## 2014-09-22 DIAGNOSIS — F1120 Opioid dependence, uncomplicated: Secondary | ICD-10-CM | POA: Diagnosis present

## 2014-09-22 DIAGNOSIS — E876 Hypokalemia: Secondary | ICD-10-CM

## 2014-09-22 MED ORDER — THIAMINE HCL 100 MG/ML IJ SOLN: 100.0000 mg | Freq: Once | INTRAMUSCULAR | Status: DC

## 2014-09-22 MED ORDER — CHLORDIAZEPOXIDE HCL 25 MG PO CAPS: 25.0000 mg | ORAL_CAPSULE | Freq: Three times a day (TID) | ORAL | Status: DC

## 2014-09-22 MED ORDER — POTASSIUM CHLORIDE CRYS ER 10 MEQ PO TBCR
10.0000 meq | EXTENDED_RELEASE_TABLET | Freq: Two times a day (BID) | ORAL | Status: AC
  Administered 2014-09-22 – 2014-09-24 (×4): 10 meq via ORAL
  Filled 2014-09-22 (×5): qty 1

## 2014-09-22 MED ORDER — ESZOPICLONE 2 MG PO TABS
2.0000 mg | ORAL_TABLET | Freq: Every day | ORAL | Status: DC
  Administered 2014-09-22 – 2014-09-24 (×2): 2 mg via ORAL
  Filled 2014-09-22 (×4): qty 1

## 2014-09-22 MED ORDER — CHLORDIAZEPOXIDE HCL 25 MG PO CAPS
25.0000 mg | ORAL_CAPSULE | Freq: Four times a day (QID) | ORAL | Status: DC
  Filled 2014-09-22 (×2): qty 1

## 2014-09-22 MED ORDER — CHLORDIAZEPOXIDE HCL 25 MG PO CAPS: 25.0000 mg | ORAL_CAPSULE | Freq: Four times a day (QID) | ORAL | Status: DC | PRN

## 2014-09-22 MED ORDER — OXYCODONE-ACETAMINOPHEN 5-325 MG PO TABS
1.0000 | ORAL_TABLET | Freq: Three times a day (TID) | ORAL | Status: DC | PRN
  Administered 2014-09-22: 1 via ORAL
  Filled 2014-09-22: qty 1

## 2014-09-22 MED ORDER — OXYCODONE HCL 5 MG PO TABS: 10.0000 mg | ORAL_TABLET | Freq: Three times a day (TID) | ORAL | Status: DC | PRN

## 2014-09-22 MED ORDER — NICOTINE 21 MG/24HR TD PT24
21.0000 mg | MEDICATED_PATCH | Freq: Every day | TRANSDERMAL | Status: DC
  Administered 2014-09-22 – 2014-09-27 (×6): 21 mg via TRANSDERMAL
  Filled 2014-09-22 (×9): qty 1

## 2014-09-22 MED ORDER — CHLORDIAZEPOXIDE HCL 25 MG PO CAPS: 25.0000 mg | ORAL_CAPSULE | ORAL | Status: DC

## 2014-09-22 MED ORDER — CHLORDIAZEPOXIDE HCL 25 MG PO CAPS: 25.0000 mg | ORAL_CAPSULE | Freq: Every day | ORAL | Status: DC

## 2014-09-22 MED ORDER — ADULT MULTIVITAMIN W/MINERALS CH
1.0000 | ORAL_TABLET | Freq: Every day | ORAL | Status: DC
  Administered 2014-09-22 – 2014-09-27 (×6): 1 via ORAL
  Filled 2014-09-22 (×9): qty 1

## 2014-09-22 MED ORDER — LOPERAMIDE HCL 2 MG PO CAPS
2.0000 mg | ORAL_CAPSULE | ORAL | Status: AC | PRN
  Administered 2014-09-23: 4 mg via ORAL
  Filled 2014-09-22: qty 2

## 2014-09-22 MED ORDER — CHLORDIAZEPOXIDE HCL 5 MG PO CAPS
5.0000 mg | ORAL_CAPSULE | Freq: Four times a day (QID) | ORAL | Status: DC | PRN
  Administered 2014-09-22 – 2014-09-23 (×2): 5 mg via ORAL
  Filled 2014-09-22: qty 1

## 2014-09-22 MED ORDER — DULOXETINE HCL 30 MG PO CPEP
30.0000 mg | ORAL_CAPSULE | Freq: Every day | ORAL | Status: DC
  Administered 2014-09-22 – 2014-09-23 (×2): 30 mg via ORAL
  Filled 2014-09-22 (×5): qty 1

## 2014-09-22 MED ORDER — LIDOCAINE 5 % EX PTCH
1.0000 | MEDICATED_PATCH | CUTANEOUS | Status: DC
  Administered 2014-09-22 – 2014-09-26 (×4): 1 via TRANSDERMAL
  Filled 2014-09-22 (×10): qty 1

## 2014-09-22 MED ORDER — NAPROXEN 500 MG PO TABS
500.0000 mg | ORAL_TABLET | Freq: Two times a day (BID) | ORAL | Status: DC
  Administered 2014-09-22 – 2014-09-25 (×6): 500 mg via ORAL
  Filled 2014-09-22 (×12): qty 1

## 2014-09-22 MED ORDER — VITAMIN B-1 100 MG PO TABS
100.0000 mg | ORAL_TABLET | Freq: Every day | ORAL | Status: DC
  Administered 2014-09-23 – 2014-09-27 (×5): 100 mg via ORAL
  Filled 2014-09-22 (×8): qty 1

## 2014-09-22 MED ORDER — OXYCODONE HCL 5 MG PO TABS
5.0000 mg | ORAL_TABLET | Freq: Three times a day (TID) | ORAL | Status: DC | PRN
  Administered 2014-09-23 (×2): 5 mg via ORAL
  Filled 2014-09-22 (×2): qty 1

## 2014-09-22 NOTE — [facility] Group Notes (Signed)
[place] LCSW Group Therapy 09/22/2014 1:15 PM Type of Therapy: Group Therapy Participation Level: Active  Participation Quality: Attentive, Sharing and Supportive  Affect: Depressed and flat  Cognitive: Alert and Oriented  Insight: Developing/Improving and Engaged  Engagement in Therapy: Developing/Improving and Engaged  Modes of Intervention: Clarification, Confrontation, Discussion, Education, Exploration, Limit-setting, Orientation, Problem-solving, Rapport Building, Art therapist, Socialization and Support  Summary of Progress/Problems: The topic for today was feelings about relapse. Pt discussed what relapse prevention is to them and identified triggers that they are on the path to relapse. Pt processed their feeling towards relapse and was able to relate to peers. Pt discussed coping skills that can be used for relapse prevention. Patient identified isolation as her relapse behavior, reporting that it is often triggered by arguments with her husband. She shared that she enjoys spending time with her family and attending her son's soccer games. CSW and other group members provided patient with emotional support and encouragement.  Tilden Fossa, MSW, [place] Worker [facility] [phone]

## 2014-09-22 NOTE — [facility] Group Notes (Signed)
[facility] LCSW Aftercare Discharge Planning Group Note  09/22/2014  8:45 AM  Participation Quality: Did Not Attend. Patient invited to participate but declined.  Roneka Gilpin, MSW, LCSWA Clinical Social Worker [facility] [phone]    
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
## 2014-09-22 NOTE — Progress Notes (Signed)
D) Pt is upset over the fact that the physician she was seeing for her pain management will no longer see her. If she is not seen by anyone in the community she will not be able to continue her pain medications here. Tearful and upset. Is aware that  she should not have lied to her doctor now. Requested that [facility] phone number be given to her so she could call and set up an appointment A) Phone number given to Pt. R) Pt called and has an appointment with [facility] in [place], and verified by this Probation officer. Pt has an appointment this coming Friday at 1100 am. At the [facility] clinic.

## 2014-09-22 NOTE — Progress Notes (Signed)
Adult Psychoeducational Group Note  Date:  09/22/2014 Time:  8:45 PM  Group Topic/Focus:  Wrap-Up Group:   The focus of this group is to help patients review their daily goal of treatment and discuss progress on daily workbooks.  Participation Level:  Did Not Attend  Participation Quality:  N/A    Affect:  N/A   Cognitive:  N/A  Insight: None  Engagement in Group:  N/A  Modes of Intervention:  N/A  Additional Comments:  PT did not attend wrap-up group. Pt was asleep at the time.   Lincoln Brigham 09/22/2014, 9:41 PM

## 2014-09-22 NOTE — [facility] Suicide Risk Assessment (Signed)
[facility] INPATIENT:  Family/Significant Other Suicide Prevention Education  Suicide Prevention Education:  Patient Refusal for Family/Significant Other Suicide Prevention Education: The patient [name] [name] has refused to provide written consent for family/significant other to be provided Family/Significant Other Suicide Prevention Education during admission and/or prior to discharge.  Physician notified. SPE reviewed with patient and brochure provided. Patient encouraged to return to hospital if having suicidal thoughts, patient verbalized his/her understanding and has no further questions at this time.   Gladyse Corvin, Casimiro Needle 09/22/2014, 11:52 AM

## 2014-09-22 NOTE — Progress Notes (Signed)
D) Pt has been in her room. Upset over the fact she is not able to get her prn medications for pain. Pt states that she told her pain doctor that she was going out of town and that she needed her medications early. The office called her at home and they found out that she did not go out of town. The Doctor decided that he did not want to deal with the Pt anymore and fired her. Pt is wanting to contact her doctor and speak with him and see if he will be willing to work with her concerning her pain medications. Pt rates her depression at an 8, hopelessness at an 8 and her anxiety at an 8. Denies SI and HI. A) Given support. Social Worker and this Probation officer have been asked to sit in on a conversation with her doctor on a phone call this afternoon. Given encouragement. R) Denies SI and HI

## 2014-09-22 NOTE — [facility] Suicide Risk Assessment (Signed)
[facility] Admission Suicide Risk Assessment   Nursing information obtained from:    Demographic factors:    Current Mental Status:    Loss Factors:    Historical Factors:    Risk Reduction Factors:    Total Time spent with patient: 30 minutes Principal Problem: MDD (major depressive disorder), recurrent severe, without psychosis Diagnosis:   Patient Active Problem List   Diagnosis Date Noted  . MDD (major depressive disorder), recurrent severe, without psychosis [F33.2] 09/22/2014  . Opioid use disorder, severe, dependence [F11.20] 09/22/2014  . Hypokalemia [E87.6] 09/22/2014  . Tylenol overdose [T39.1X4A] 09/19/2014  . Suicide attempt [T14.91] 09/19/2014  . GERD (gastroesophageal reflux disease) [K21.9] 09/19/2014  . Drug dependence [F19.20]   . Polysubstance (including opioids) dependence with physiological dependence [F19.20] 07/08/2013  . Suicidal ideation [R45.851] 07/06/2013  . Nausea & vomiting [R11.2] 07/06/2013  . Chronic pain disorder [G89.4] 07/06/2013  . Cocaine abuse [F14.10] 07/06/2013  . Major depression [F32.2] 01/05/2012  . Alcohol dependence [F10.20] 01/04/2012  . Opioid dependence [F11.20] 12/29/2011  . Back pain [M54.9] 12/29/2011  . N&V (nausea and vomiting) [R11.2] 06/21/2011  . Tachycardia [R00.0] 06/21/2011  . Abdominal pain [R10.9] 06/21/2011  . Leukocytosis [D72.829] 06/21/2011     Continued Clinical Symptoms:  Alcohol Use Disorder Identification Test Final Score (AUDIT): 2 The "Alcohol Use Disorders Identification Test", Guidelines for Use in Primary Care, Second Edition.  World Pharmacologist [facility]). Score between 0-7:  no or low risk or alcohol related problems. Score between 8-15:  moderate risk of alcohol related problems. Score between 16-19:  high risk of alcohol related problems. Score 20 or above:  warrants further diagnostic evaluation for alcohol dependence and treatment.   CLINICAL FACTORS:   Alcohol/Substance  Abuse/Dependencies Chronic Pain Previous Psychiatric Diagnoses and Treatments   Musculoskeletal: Strength & Muscle Tone: within normal limits Gait & Station: normal Patient leans: N/A  Psychiatric Specialty Exam: Physical Exam  Review of Systems  Musculoskeletal: Positive for back pain.  Psychiatric/Behavioral: Positive for depression and substance abuse. The patient is nervous/anxious and has insomnia.   All other systems reviewed and are negative.   Blood pressure 128/86, pulse 103, temperature 98.3 F (36.8 C), temperature source Oral, resp. rate 18, height 5\' 2"  (1.575 m), weight 84.369 kg (186 lb), last menstrual period 08/10/2011, SpO2 98 %.Body mass index is 34.01 kg/(m^2).  General Appearance: Casual  Eye Contact::  Fair  Speech:  Clear and Coherent  Volume:  Normal  Mood:  Anxious and Depressed  Affect:  Appropriate  Thought Process:  Coherent  Orientation:  Full (Time, Place, and Person)  Thought Content:  Rumination  Suicidal Thoughts:  No  Homicidal Thoughts:  No  Memory:  Immediate;   Fair Recent;   Fair Remote;   Fair  Judgement:  Impaired  Insight:  Shallow  Psychomotor Activity:  Normal  Concentration:  Fair  Recall:  [employer] of Knowledge:Fair  Language: Fair  Akathisia:  No  Handed:  Right  AIMS (if indicated):     Assets:  Communication Skills Desire for Improvement  Sleep:  Number of Hours: 6  Cognition: WNL  ADL's:  Intact     COGNITIVE FEATURES THAT CONTRIBUTE TO RISK:  Closed-mindedness, Polarized thinking and Thought constriction (tunnel vision)    SUICIDE RISK:   Moderate:  Frequent suicidal ideation with limited intensity, and duration, some specificity in terms of plans, no associated intent, good self-control, limited dysphoria/symptomatology, some risk factors present, and identifiable protective factors, including available and accessible  social support.  PLAN OF CARE: Patient will benefit from inpatient treatment and  stabilization.  Estimated length of stay is 5-7 days.  Reviewed past medical records,treatment plan.  Will DC Elavil- 2/2 lack of efficacy as well as hx of suicide attempts - TCA 's may not be the right choice in this type of patient population. Will start Cymbalta 30 mg po daily for affetcive sx. Add Lunesta 2 mg po qhs for sleep. Hypokalemia - Kdur 10 meq x 4 doses - get BMP repeat.TSH, urine preg test. Patient wanting pain management - will provide alternative pain management options at this time . Pt reports she would talk to her PCP and get back to [place] whether he would continue her on opioid pain medications on DC. Will continue to monitor vitals ,medication compliance and treatment side effects while patient is here.  Will monitor for medical issues as well as call consult as needed.  Reviewed labs ,will order as needed.  CSW will start working on disposition.  Patient to participate in therapeutic milieu .       Medical Decision Making:  Review of Psycho-Social Stressors (1), Review or order clinical lab tests (1), Review and summation of old records (2), Established Problem, Worsening (2), Review of Medication Regimen & Side Effects (2) and Review of New Medication or Change in Dosage (2)  I certify that inpatient services furnished can reasonably be expected to improve the patient's condition.   Letisha Yera md  09/22/2014, 12:49 PM

## 2014-09-22 NOTE — Progress Notes (Signed)
Nutrition Education Note  Pt attended group focusing on general, healthful nutrition education.  RD emphasized the importance of eating regular meals and snacks throughout the day. Consuming sugar-free beverages and incorporating fruits and vegetables into diet when possible. Provided examples of healthy snacks. Patient encouraged to leave group with a goal to improve nutrition/healthy eating.   Diet Order: Diet regular Room service appropriate?: Yes; Fluid consistency:: Thin Pt is also offered choice of unit snacks mid-morning and mid-afternoon.  Pt is eating as desired.   If additional nutrition issues arise, please consult RD.     Jacqulene Huntley, RD, LDN Inpatient Clinical Dietitian Pager # [phone] After hours/weekend pager # [phone]     

## 2014-09-22 NOTE — H&P (Signed)
Psychiatric Admission Assessment Adult  Patient Identification: [name] [name] MRN:  [id number] Date of Evaluation:  09/22/2014 Chief Complaint:  MDD Principal Diagnosis: MDD (major depressive disorder), recurrent severe, without psychosis Diagnosis:   Patient Active Problem List   Diagnosis Date Noted  . MDD (major depressive disorder), recurrent severe, without psychosis [F33.2] 09/22/2014  . Opioid use disorder, severe, dependence [F11.20] 09/22/2014  . Hypokalemia [E87.6] 09/22/2014  . Tylenol overdose [T39.1X4A] 09/19/2014  . Suicide attempt [T14.91] 09/19/2014  . GERD (gastroesophageal reflux disease) [K21.9] 09/19/2014  . Drug dependence [F19.20]   . Polysubstance (including opioids) dependence with physiological dependence [F19.20] 07/08/2013  . Suicidal ideation [R45.851] 07/06/2013  . Nausea & vomiting [R11.2] 07/06/2013  . Chronic pain disorder [G89.4] 07/06/2013  . Cocaine abuse [F14.10] 07/06/2013  . Major depression [F32.2] 01/05/2012  . Alcohol dependence [F10.20] 01/04/2012  . Opioid dependence [F11.20] 12/29/2011  . Back pain [M54.9] 12/29/2011  . N&V (nausea and vomiting) [R11.2] 06/21/2011  . Tachycardia [R00.0] 06/21/2011  . Abdominal pain [R10.9] 06/21/2011  . Leukocytosis [D72.829] 06/21/2011   History of Present Illness: [name] [name], 34 y.o. female patient admitted with intentional drug overdose.  She was seen initially at [facility].  Patient came to the hospital saying that she was taken 100 tablets of extra Tylenol for migraine and backache.  She states she has three rods in her back, a herniated and bulging disk.  Patient reported her trigger was being told that her primary care physician a 18 years retiring and a new physician may not continue her in the outpatient care.  When Dr Claris Gower office was called, it was reported to this NP that the office can no longer manage her pain and that she has falsified Rx in the past.    Per  previous reports, patient has been diagnosed with a major depressive disorder and has multiple acute psychiatric hospitalization at behavioral [facility]. Patient reported she was hospitalized 5 times in the last 10 years and her recent behavioral health hospitalization was June 2015 for intentional drug overdose as a suicide attempt. Patient minimizes her suicidal attempt during this hospitalization saying that she spoke with a couple of physicians who may be able to care for her as outpatient.  Patient is fixated on her pain meds.  She is almost distraught at the knowledge that she will not have her Oxy ER and Opana.    HPI Elements:  Location: Depression anxiety. Quality: Fair to poor. Severity: Intentional overdose. Timing: Primary care physician retiring. Duration: One week. Context: Psychosocial stresses and history of suicidal attempts.  Associated Signs/Symptoms: Depression Symptoms:  depressed mood, hopelessness, impaired memory, anxiety, (Hypo) Manic Symptoms:  Distractibility, Impulsivity, Irritable Mood, Labiality of Mood, Anxiety Symptoms:  Excessive Worry, Social Anxiety, Psychotic Symptoms:  NA PTSD Symptoms: NA Total Time spent with patient: 45 minutes  Past Medical History:  Past Medical History  Diagnosis Date  . Migraine   . GERD (gastroesophageal reflux disease)     protonix evenings  . Endometriosis     chronic pain  . Anxiety     klonipin for stress disorder  . Degenerative disc disease   . Chronic back pain     Past Surgical History  Procedure Laterality Date  . Appendectomy    . Tonsillectomy    . Tubal ligation    . Laparoscopic assisted vaginal hysterectomy  08/19/2011    Procedure: LAPAROSCOPIC ASSISTED VAGINAL HYSTERECTOMY;  Surgeon: Gus Height, MD;  Location: [facility];  Service: Gynecology;  Laterality: N/A;  . Abdominal hysterectomy     Family History:  Family History  Problem Relation Age of Onset  . Coronary artery disease  Father   . Coronary artery disease Maternal Grandmother   . Breast cancer Mother   . Cancer Mother    Social History:  History  Alcohol Use No     History  Drug Use No    Comment: denies    Social History   Social History  . Marital Status: Married    Spouse Name: N/A  . Number of Children: N/A  . Years of Education: N/A   Social History Main Topics  . Smoking status: Current Every Day Smoker -- 1.00 packs/day for 14 years    Types: Cigarettes  . Smokeless tobacco: None  . Alcohol Use: No  . Drug Use: No     Comment: denies  . Sexual Activity: Yes   Other Topics Concern  . None   Social History Narrative   Additional Social History:    Pain Medications: yes pt states she takes oxycontin 60 mg bid and opana q8 hours Prescriptions: yes Over the Counter: none History of alcohol / drug use?: Yes Longest period of sobriety (when/how long): pt denies alcohol and / or other drug abuse " on over 10 yrs" Negative Consequences of Use: Legal Withdrawal Symptoms: Anorexia, Fever / Chills, Tremors  Musculoskeletal: Strength & Muscle Tone: within normal limits Gait & Station: normal Patient leans: N/A  Psychiatric Specialty Exam: Physical Exam  Vitals reviewed. Psychiatric: Her mood appears anxious. She exhibits a depressed mood.    Review of Systems  Psychiatric/Behavioral: Positive for depression. The patient is nervous/anxious.   All other systems reviewed and are negative.   Blood pressure 128/86, pulse 103, temperature 98.3 F (36.8 C), temperature source Oral, resp. rate 18, height _0  (1.575 m), weight 84.369 kg (186 lb), last menstrual period 08/10/2011, SpO2 98 %.Body mass index is 34.01 kg/(m^2).   General Appearance: Casual  Eye Contact:: Fair  Speech: Clear and Coherent  Volume: Normal  Mood: Anxious and Depressed  Affect: Appropriate  Thought Process: Coherent  Orientation: Full (Time, Place, and Person)  Thought Content:  Rumination  Suicidal Thoughts: No  Homicidal Thoughts: No  Memory: Immediate; Fair Recent; Fair Remote; Fair  Judgement: Impaired  Insight: Shallow  Psychomotor Activity: Normal  Concentration: Fair  Recall: [employer] of Knowledge:Fair  Language: Fair  Akathisia: No  Handed: Right  AIMS (if indicated):    Assets: Communication Skills Desire for Improvement  Sleep: Number of Hours: 6  Cognition: WNL  ADL's: Intact       Risk to Self: Is patient at risk for suicide?: Yes Risk to Others:   Prior Inpatient Therapy:   Prior Outpatient Therapy:    Alcohol Screening: 1. How often do you have a drink containing alcohol?: Never 9. Have you or someone else been injured as a result of your drinking?: No 10. Has a relative or friend or a doctor or another health worker been concerned about your drinking or suggested you cut down?: Yes, but not in the last year Alcohol Use Disorder Identification Test Final Score (AUDIT): 2 Brief Intervention: Patient declined brief intervention  Allergies:   Allergies  Allergen Reactions  . Epidural Tray [id number]" [Nerve Block Tray] Other (See Comments)    Paralysis and severe pain in head/neck/shoulders.  No anaphylaxis.  . Ibuprofen Hives  . Penicillins Hives  . Zofran [Ondansetron Hcl] Nausea And Vomiting  Lab Results:  Results for orders placed or performed during the hospital encounter of 09/19/14 (from the past 48 hour(s))  Basic metabolic panel     Status: Abnormal   Collection Time: 09/21/14  8:20 AM  Result Value Ref Range   Sodium 133 (L) 135 - 145 mmol/L   Potassium 3.1 (L) 3.5 - 5.1 mmol/L   Chloride 107 101 - 111 mmol/L   CO2 19 (L) 22 - 32 mmol/L   Glucose, Bld 141 (H) 65 - 99 mg/dL   BUN <5 (L) 6 - 20 mg/dL   Creatinine, Ser 0.61 0.44 - 1.00 mg/dL   Calcium 8.8 (L) 8.9 - 10.3 mg/dL   GFR calc non Af Amer >60 >60 mL/min   GFR calc Af Amer >60 >60 mL/min    Comment: (NOTE) The eGFR  has been calculated using the CKD EPI equation. This calculation has not been validated in all clinical situations. eGFR's persistently <60 mL/min signify possible Chronic Kidney Disease.    Anion gap 7 5 - 15   Current Medications: Current Facility-Administered Medications  Medication Dose Route Frequency Provider Last Rate Last Dose  . acetaminophen (TYLENOL) tablet 650 mg  650 mg Oral Q6H PRN Kerrie Buffalo, NP      . acyclovir ointment (ZOVIRAX) 5 %   Topical Q3H Kerrie Buffalo, NP      . alum & mag hydroxide-simeth (MAALOX/MYLANTA) 200-200-20 MG/5ML suspension 30 mL  30 mL Oral Q4H PRN Kerrie Buffalo, NP      . chlordiazePOXIDE (LIBRIUM) capsule 5 mg  5 mg Oral QID PRN Kerrie Buffalo, NP   5 mg at 09/22/14 1307  . DULoxetine (CYMBALTA) DR capsule 30 mg  30 mg Oral Daily Saramma Eappen, MD   30 mg at 09/22/14 1309  . eszopiclone (LUNESTA) tablet 2 mg  2 mg Oral QHS Kerrie Buffalo, NP      . hydrOXYzine (ATARAX/VISTARIL) tablet 25 mg  25 mg Oral QHS PRN Kerrie Buffalo, NP      . lidocaine (LIDODERM) 5 % 1 patch  1 patch Transdermal Q24H Kerrie Buffalo, NP   1 patch at 09/22/14 1307  . loperamide (IMODIUM) capsule 2-4 mg  2-4 mg Oral PRN Kerrie Buffalo, NP      . magnesium hydroxide (MILK OF MAGNESIA) suspension 30 mL  30 mL Oral Daily PRN Kerrie Buffalo, NP      . multivitamin with minerals tablet 1 tablet  1 tablet Oral Daily Kerrie Buffalo, NP   1 tablet at 09/22/14 1054  . naproxen (NAPROSYN) tablet 500 mg  500 mg Oral BID WC Kerrie Buffalo, NP      . nicotine (NICODERM CQ - dosed in mg/24 hours) patch 21 mg  21 mg Transdermal Q0600 Nicholaus Bloom, MD   21 mg at 09/22/14 0758  . oxyCODONE-acetaminophen (PERCOCET/ROXICET) 5-325 MG per tablet 1 tablet  1 tablet Oral Q8H PRN Kerrie Buffalo, NP      . potassium chloride (K-DUR,KLOR-CON) CR tablet 10 mEq  10 mEq Oral BID Kerrie Buffalo, NP      . Derrill Memo ON 09/23/2014] thiamine (VITAMIN B-1) tablet 100 mg  100 mg Oral Daily Kerrie Buffalo, NP        PTA Medications: Prescriptions prior to admission  Medication Sig Dispense Refill Last Dose  . amitriptyline (ELAVIL) 100 MG tablet take 1-3 tablets by mouth at bedtime for depression sleep or pain  1 09/20/2014 at Unknown time  . FLUoxetine (PROZAC) 40 MG capsule Take 2 capsules (80 mg  total) by mouth daily. 60 capsule 0 09/20/2014 at Unknown time  . gabapentin (NEURONTIN) 100 MG capsule Take 1 capsule (100 mg total) by mouth 3 (three) times daily. 90 capsule 0 09/20/2014 at Unknown time  . OxyCODONE HCl ER (OXYCONTIN) 60 MG T12A Take 60 mg by mouth 2 (two) times daily.   Past Week at Unknown time  . pantoprazole (PROTONIX) 40 MG tablet Take 1 tablet (40 mg total) by mouth daily.   09/20/2014 at Unknown time  . ABILIFY 10 MG tablet Take 10 mg by mouth every morning.   0 Unknown at Unknown time  . cloNIDine (CATAPRES) 0.3 MG tablet take 1 tablet by mouth every 6 hours if needed  0   . oxymorphone (OPANA) 10 MG tablet take 1 tablet by mouth every 8 hours if needed for pain  0   . polyethylene glycol (MIRALAX / GLYCOLAX) packet Take 17 g by mouth 2 (two) times daily as needed (constipation).    Unknown at Unknown time  . potassium chloride 20 MEQ TBCR Take 20 mEq by mouth daily. 7 tablet 0 Unknown at Unknown time  . VYVANSE 70 MG capsule Take 70 mg by mouth once daily  0 Unknown at Unknown time    Previous Psychotropic Medications: Yes   Substance Abuse History in the last 12 months:  Yes.    Consequences of Substance Abuse: substance dependence, narcotic abuse, inpatient crisis management  Results for orders placed or performed during the hospital encounter of 09/19/14 (from the past 72 hour(s))  Urine rapid drug screen (hosp performed)     Status: Abnormal   Collection Time: 09/19/14  6:26 PM  Result Value Ref Range   Opiates POSITIVE (A) NONE DETECTED   Cocaine NONE DETECTED NONE DETECTED   Benzodiazepines NONE DETECTED NONE DETECTED   Amphetamines NONE DETECTED NONE DETECTED    Tetrahydrocannabinol NONE DETECTED NONE DETECTED   Barbiturates NONE DETECTED NONE DETECTED    Comment:        DRUG SCREEN FOR MEDICAL PURPOSES ONLY.  IF CONFIRMATION IS NEEDED FOR ANY PURPOSE, NOTIFY LAB WITHIN 5 DAYS.        LOWEST DETECTABLE LIMITS FOR URINE DRUG SCREEN Drug Class       Cutoff (ng/mL) Amphetamine      1000 Barbiturate      200 Benzodiazepine   859 Tricyclics       292 Opiates          300 Cocaine          300 THC              50   Acetaminophen level     Status: None   Collection Time: 09/20/14  7:40 AM  Result Value Ref Range   Acetaminophen (Tylenol), Serum 11 10 - 30 ug/mL    Comment:        THERAPEUTIC CONCENTRATIONS VARY SIGNIFICANTLY. A RANGE OF 10-30 ug/mL MAY BE AN EFFECTIVE CONCENTRATION FOR MANY PATIENTS. HOWEVER, SOME ARE BEST TREATED AT CONCENTRATIONS OUTSIDE THIS RANGE. ACETAMINOPHEN CONCENTRATIONS >150 ug/mL AT 4 HOURS AFTER INGESTION AND >50 ug/mL AT 12 HOURS AFTER INGESTION ARE OFTEN ASSOCIATED WITH TOXIC REACTIONS.   CBC     Status: Abnormal   Collection Time: 09/20/14  2:00 PM  Result Value Ref Range   WBC 16.9 (H) 4.0 - 10.5 K/uL   RBC 4.60 3.87 - 5.11 MIL/uL   Hemoglobin 13.8 12.0 - 15.0 g/dL   HCT 40.4 36.0 - 46.0 %  MCV 87.8 78.0 - 100.0 fL   MCH 30.0 26.0 - 34.0 pg   MCHC 34.2 30.0 - 36.0 g/dL   RDW 13.8 11.5 - 15.5 %   Platelets 316 150 - 400 K/uL  Comprehensive metabolic panel     Status: Abnormal   Collection Time: 09/20/14  2:00 PM  Result Value Ref Range   Sodium 133 (L) 135 - 145 mmol/L   Potassium 3.1 (L) 3.5 - 5.1 mmol/L    Comment: DELTA CHECK NOTED REPEATED TO VERIFY    Chloride 104 101 - 111 mmol/L   CO2 17 (L) 22 - 32 mmol/L   Glucose, Bld 137 (H) 65 - 99 mg/dL   BUN 6 6 - 20 mg/dL   Creatinine, Ser 0.58 0.44 - 1.00 mg/dL   Calcium 9.0 8.9 - 10.3 mg/dL   Total Protein 8.1 6.5 - 8.1 g/dL   Albumin 3.8 3.5 - 5.0 g/dL   AST 29 15 - 41 U/L   ALT 43 14 - 54 U/L   Alkaline Phosphatase 97 38 - 126 U/L    Total Bilirubin 0.9 0.3 - 1.2 mg/dL   GFR calc non Af Amer >60 >60 mL/min   GFR calc Af Amer >60 >60 mL/min    Comment: (NOTE) The eGFR has been calculated using the CKD EPI equation. This calculation has not been validated in all clinical situations. eGFR's persistently <60 mL/min signify possible Chronic Kidney Disease.    Anion gap 12 5 - 15  Magnesium     Status: None   Collection Time: 09/20/14  2:00 PM  Result Value Ref Range   Magnesium 2.1 1.7 - 2.4 mg/dL  Protime-INR     Status: None   Collection Time: 09/20/14  2:00 PM  Result Value Ref Range   Prothrombin Time 14.4 11.6 - 15.2 seconds   INR 1.10 0.00 - 7.32  Basic metabolic panel     Status: Abnormal   Collection Time: 09/21/14  8:20 AM  Result Value Ref Range   Sodium 133 (L) 135 - 145 mmol/L   Potassium 3.1 (L) 3.5 - 5.1 mmol/L   Chloride 107 101 - 111 mmol/L   CO2 19 (L) 22 - 32 mmol/L   Glucose, Bld 141 (H) 65 - 99 mg/dL   BUN <5 (L) 6 - 20 mg/dL   Creatinine, Ser 0.61 0.44 - 1.00 mg/dL   Calcium 8.8 (L) 8.9 - 10.3 mg/dL   GFR calc non Af Amer >60 >60 mL/min   GFR calc Af Amer >60 >60 mL/min    Comment: (NOTE) The eGFR has been calculated using the CKD EPI equation. This calculation has not been validated in all clinical situations. eGFR's persistently <60 mL/min signify possible Chronic Kidney Disease.    Anion gap 7 5 - 15    Observation Level/Precautions:  15 minute checks  Laboratory:  per ED  Psychotherapy:  group  Medications:  As per medlist  Consultations:  As needed  Discharge Concerns:  safety  Estimated LOS:  5-7 days  Other:     Psychological Evaluations: Yes   Treatment Plan Summary: as discussed with Dr Shea Evans Patient will benefit from inpatient treatment and stabilization.  Estimated length of stay is 5-7 days.  Reviewed past medical records,treatment plan.  Will DC Elavil- 2/2 lack of efficacy as well as hx of suicide attempts - TCA 's may not be the right choice in this  type of patient population. Will start Cymbalta 30 mg po daily for affetcive  sx. Add Lunesta 2 mg po qhs for sleep. Hypokalemia - Kdur 10 meq x 4 doses - get BMP repeat.TSH, urine preg test. Added Percocet 5/325 mg 1 tab Q8hrs prn Patient wanting pain management - will provide alternative pain management options at this time . Pt reports she would talk to her PCP and get back to [place] whether he would continue her on opioid pain medications on DC. Will continue to monitor vitals ,medication compliance and treatment side effects while patient is here.  Will monitor for medical issues as well as call consult as needed.  Reviewed labs ,will order as needed.  CSW will start working on disposition.  Patient to participate in therapeutic milieu .  Medical Decision Making:  Review of Psycho-Social Stressors (1), Review or order clinical lab tests (1), Discuss test with performing physician (1), Decision to obtain old records (1), Review and summation of old records (2) and Review of New Medication or Change in Dosage (2)  I certify that inpatient services furnished can reasonably be expected to improve the patient's condition.   Freda Munro May Shuntae Herzig AGNP-BC 9/2/20164:44 PM

## 2014-09-23 ENCOUNTER — Emergency Department (HOSPITAL_COMMUNITY)
Admission: EM | Admit: 2014-09-23 | Discharge: 2014-09-23 | Discharge status: Absent without leave | Payer: Medicaid Other

## 2014-09-23 ENCOUNTER — Inpatient Hospital Stay (HOSPITAL_COMMUNITY): Payer: Medicaid Other

## 2014-09-23 DIAGNOSIS — F332 Major depressive disorder, recurrent severe without psychotic features: Principal | ICD-10-CM

## 2014-09-23 DIAGNOSIS — F1120 Opioid dependence, uncomplicated: Secondary | ICD-10-CM

## 2014-09-23 LAB — CBC WITH DIFFERENTIAL/PLATELET
Basophils Absolute: 0 10*3/uL (ref 0.0–0.1)
Basophils Relative: 0 % (ref 0–1)
Eosinophils Absolute: 0.1 10*3/uL (ref 0.0–0.7)
Eosinophils Relative: 1 % (ref 0–5)
HCT: 35.8 % — ABNORMAL LOW (ref 36.0–46.0)
Hemoglobin: 12 g/dL (ref 12.0–15.0)
Lymphocytes Relative: 23 % (ref 12–46)
Lymphs Abs: 2.8 10*3/uL (ref 0.7–4.0)
MCH: 29.8 pg (ref 26.0–34.0)
MCHC: 33.5 g/dL (ref 30.0–36.0)
MCV: 88.8 fL (ref 78.0–100.0)
Monocytes Absolute: 0.5 10*3/uL (ref 0.1–1.0)
Monocytes Relative: 4 % (ref 3–12)
Neutro Abs: 8.6 10*3/uL — ABNORMAL HIGH (ref 1.7–7.7)
Neutrophils Relative %: 72 % (ref 43–77)
Platelets: 322 10*3/uL (ref 150–400)
RBC: 4.03 MIL/uL (ref 3.87–5.11)
RDW: 13.4 % (ref 11.5–15.5)
WBC: 12 10*3/uL — ABNORMAL HIGH (ref 4.0–10.5)

## 2014-09-23 LAB — TSH: TSH: 0.783 u[IU]/mL (ref 0.350–4.500)

## 2014-09-23 LAB — I-STAT BETA HCG BLOOD, ED (MC, WL, AP ONLY): I-stat hCG, quantitative: <5 m[IU]/mL (ref <5)

## 2014-09-23 MED ORDER — CLONIDINE HCL 0.1 MG PO TABS
0.1000 mg | ORAL_TABLET | Freq: Every day | ORAL | Status: DC
  Administered 2014-09-26: 0.1 mg via ORAL

## 2014-09-23 MED ORDER — DICYCLOMINE HCL 20 MG PO TABS: 20.0000 mg | ORAL_TABLET | Freq: Four times a day (QID) | ORAL | Status: DC | PRN

## 2014-09-23 MED ORDER — DULOXETINE HCL 20 MG PO CPEP
40.0000 mg | ORAL_CAPSULE | Freq: Every day | ORAL | Status: DC
  Filled 2014-09-23: qty 2

## 2014-09-23 MED ORDER — GABAPENTIN 300 MG PO CAPS
300.0000 mg | ORAL_CAPSULE | Freq: Three times a day (TID) | ORAL | Status: DC
  Administered 2014-09-23 – 2014-09-26 (×8): 300 mg via ORAL
  Filled 2014-09-23 (×12): qty 1

## 2014-09-23 MED ORDER — CHLORDIAZEPOXIDE HCL 25 MG PO CAPS
25.0000 mg | ORAL_CAPSULE | Freq: Four times a day (QID) | ORAL | Status: DC | PRN
  Administered 2014-09-23: 25 mg via ORAL

## 2014-09-23 MED ORDER — ONDANSETRON 4 MG PO TBDP
4.0000 mg | ORAL_TABLET | Freq: Once | ORAL | Status: AC
  Administered 2014-09-23: 4 mg via ORAL
  Filled 2014-09-23: qty 1

## 2014-09-23 MED ORDER — DULOXETINE HCL 60 MG PO CPEP
60.0000 mg | ORAL_CAPSULE | Freq: Every day | ORAL | Status: DC
  Administered 2014-09-24 – 2014-09-27 (×4): 60 mg via ORAL
  Filled 2014-09-23 (×6): qty 1

## 2014-09-23 MED ORDER — TIZANIDINE HCL 2 MG PO TABS
2.0000 mg | ORAL_TABLET | Freq: Three times a day (TID) | ORAL | Status: DC | PRN
  Administered 2014-09-25 – 2014-09-27 (×2): 2 mg via ORAL
  Filled 2014-09-23 (×3): qty 1

## 2014-09-23 MED ORDER — METHOCARBAMOL 500 MG PO TABS: 500.0000 mg | ORAL_TABLET | Freq: Three times a day (TID) | ORAL | Status: DC | PRN

## 2014-09-23 MED ORDER — CLONIDINE HCL 0.1 MG PO TABS
0.1000 mg | ORAL_TABLET | Freq: Four times a day (QID) | ORAL | Status: AC
Start: 2014-09-23 — End: 2014-09-25
  Administered 2014-09-23 – 2014-09-25 (×8): 0.1 mg via ORAL
  Filled 2014-09-23 (×10): qty 1

## 2014-09-23 MED ORDER — CLONIDINE HCL 0.1 MG PO TABS
0.1000 mg | ORAL_TABLET | ORAL | Status: DC
  Administered 2014-09-25 – 2014-09-26 (×2): 0.1 mg via ORAL
  Filled 2014-09-23 (×4): qty 1

## 2014-09-23 MED ORDER — CHLORDIAZEPOXIDE HCL 25 MG PO CAPS
ORAL_CAPSULE | ORAL | Status: AC
  Administered 2014-09-23: 17:00:00
  Filled 2014-09-23: qty 1

## 2014-09-23 NOTE — ED Notes (Signed)
Bed: [facility] Expected date:  Expected time:  Means of arrival:  Comments: [name] [name] is in this RM

## 2014-09-23 NOTE — Progress Notes (Signed)
D) Pt did not attend the groups today. Rates her depression at a 9, hopelessness at a 9 and her anxiety at a 7. Is positive for suicidal ideations. Pt stated this morning that she was having a difficult time with detox because "I am not getting the same amount of pain medications that I was getting at home. Pt has been at the medication window requesting pain meds stating "Well if I don't get pain meds you'll have to detox me". After speaking with the NP, Pt was taken off her pain medications completely and was put on the clonidine  protocol and the librium protocol. Pt wanted to sign a 72 hour form, but could not due to being involuntarily committed. Pt was on the phone and was overheard by a Tech while speaking to her husband that Pt has requested husband to bring her some medications because "they took me off all my meds". Pt was approached with this information by this writer and the [facility] Letitia Libra, RN to let her know that her visitors would be stopped from visiting for the next 24 hours due to her conversations and requests for the pain medications. Pt also was calling her daughter and the daughters place of business called and informed the secretary that if she did not stop calling the daughter was in jeopardy  of losing her job. Pt was approached with this information as well. At 1700 Pt came to this writer and stated "I didn't want to tell you, but I was trying to put in a tongue ring with my eyelash brush and somehow I swallowed the piece of metal. I just wanted to tell you. I wasn't going to but, I am starting to have pains in my abdomen that are shape. Pt pointed to her left lower quadrant.  This information was shared with the NP's and as a team Pt was approached in a gently manner telling her she would be sent over to the ED for an Xray. A) Given support, Therapeutic voice and attitude used with Pt. Encouragement given to talk with staff when issues occur with her. Praised when appropriate. Objects were  removed from Pt's room and Pt was placed on a 1:1 at 1700 R) After Pt knew she was going to go to the ED she was able to calm and ate her dinner in bed. Pt did not complain of pain to this Probation officer after that.

## 2014-09-23 NOTE — Progress Notes (Signed)
Patient ID: [name] [name], female   DOB: [birth year], 34 y.o.   MRN: [id number] D: Client isolative, in her room this shift except to get medications. Client forward little but does report "they took away my pain medication" "can you wake me when it's time for another dose" "I hurt all the time, rates pain and "8" of 10. A: Writer provide emotional support, reviewed medications offered client Ibuprofen, or tylenol for pain. Staff will monitor q56min for safety. R: Client refused medications offered, instead went back to bed. Client is safe on the unit, respirations even, unlabored with rest.

## 2014-09-23 NOTE — Progress Notes (Signed)
[facility] MD Progress Note  09/23/2014 11:38 AM [name] [name]  MRN:  [id number] Subjective:  Patient states that she is having diarrhea and nausea.  She remains fixated on her pain and is asking to receive more medications.  Patient is in her room.  Ordered Oxycodone IR 5 mg Q8h PRN.  She had scheduled appt at [facility].  Discussed in length with patient her situation with pain meds, her PCP referring her to pain management clinic, [facility]. not accepting her as a patient due to her overuse of narcotics and forging of prescriptions per Dr Claris Gower office.  Discussed with Dr Adele Schilder, Clonidine protocol ordered,  Oxy IR dc'd.    Principal Problem: MDD (major depressive disorder), recurrent severe, without psychosis Diagnosis:   Patient Active Problem List   Diagnosis Date Noted  . MDD (major depressive disorder), recurrent severe, without psychosis [F33.2] 09/22/2014  . Opioid use disorder, severe, dependence [F11.20] 09/22/2014  . Hypokalemia [E87.6] 09/22/2014  . Tylenol overdose [T39.1X4A] 09/19/2014  . Suicide attempt [T14.91] 09/19/2014  . GERD (gastroesophageal reflux disease) [K21.9] 09/19/2014  . Drug dependence [F19.20]   . Polysubstance (including opioids) dependence with physiological dependence [F19.20] 07/08/2013  . Suicidal ideation [R45.851] 07/06/2013  . Nausea & vomiting [R11.2] 07/06/2013  . Chronic pain disorder [G89.4] 07/06/2013  . Cocaine abuse [F14.10] 07/06/2013  . Major depression [F32.2] 01/05/2012  . Alcohol dependence [F10.20] 01/04/2012  . Opioid dependence [F11.20] 12/29/2011  . Back pain [M54.9] 12/29/2011  . N&V (nausea and vomiting) [R11.2] 06/21/2011  . Tachycardia [R00.0] 06/21/2011  . Abdominal pain [R10.9] 06/21/2011  . Leukocytosis [D72.829] 06/21/2011   Total Time spent with patient: 30 minutes   Past Medical History:  Past Medical History  Diagnosis Date  . Migraine   . GERD (gastroesophageal reflux disease)     protonix  evenings  . Endometriosis     chronic pain  . Anxiety     klonipin for stress disorder  . Degenerative disc disease   . Chronic back pain     Past Surgical History  Procedure Laterality Date  . Appendectomy    . Tonsillectomy    . Tubal ligation    . Laparoscopic assisted vaginal hysterectomy  08/19/2011    Procedure: LAPAROSCOPIC ASSISTED VAGINAL HYSTERECTOMY;  Surgeon: Gus Height, MD;  Location: [facility];  Service: Gynecology;  Laterality: N/A;  . Abdominal hysterectomy     Family History:  Family History  Problem Relation Age of Onset  . Coronary artery disease Father   . Coronary artery disease Maternal Grandmother   . Breast cancer Mother   . Cancer Mother    Social History:  History  Alcohol Use No     History  Drug Use No    Comment: denies    Social History   Social History  . Marital Status: Married    Spouse Name: N/A  . Number of Children: N/A  . Years of Education: N/A   Social History Main Topics  . Smoking status: Current Every Day Smoker -- 1.00 packs/day for 14 years    Types: Cigarettes  . Smokeless tobacco: None  . Alcohol Use: No  . Drug Use: No     Comment: denies  . Sexual Activity: Yes   Other Topics Concern  . None   Social History Narrative   Additional History:    Sleep: Fair  Appetite:  Fair  Assessment:  [name] [name], 34 y.o., with hx of substance abuse.  She was  seen in the ED for attempted overdose on Acetaminophen.    Musculoskeletal: Strength & Muscle Tone: within normal limits Gait & Station: normal Patient leans: N/A   Psychiatric Specialty Exam: Physical Exam  Vitals reviewed. Psychiatric: Her mood appears anxious. Her affect is labile. She exhibits a depressed mood.    Review of Systems  All other systems reviewed and are negative.   Blood pressure 139/72, pulse 109, temperature 98.7 F (37.1 C), temperature source Oral, resp. rate 12, height 5\' 2"  (1.575 m), weight 84.369 kg (186 lb), last menstrual period  08/10/2011, SpO2 98 %.Body mass index is 34.01 kg/(m^2).   General Appearance: Casual,Neat  Eye Contact:: Fair  Speech: Clear and Coherent  Volume: Normal  Mood: Anxious and Depressed  Affect: Appropriate  Thought Process: Coherent  Orientation: Full (Time, Place, and Person)  Thought Content: Rumination, Fixated on her pain meds  Suicidal Thoughts: No  Homicidal Thoughts: No  Memory: Immediate; Fair Recent; Fair Remote; Fair  Judgement: Impaired  Insight: Shallow  Psychomotor Activity: Normal  Concentration: Fair  Recall: [employer] of Knowledge:Fair  Language: Fair  Akathisia: No  Handed: Right  AIMS (if indicated):    Assets: Communication Skills Desire for Improvement  Sleep: Number of Hours: 6  Cognition: WNL  ADL's: Intact       Current Medications: Current Facility-Administered Medications  Medication Dose Route Frequency Provider Last Rate Last Dose  . acetaminophen (TYLENOL) tablet 650 mg  650 mg Oral Q6H PRN Kerrie Buffalo, NP   650 mg at 09/23/14 0615  . acyclovir ointment (ZOVIRAX) 5 %   Topical Q3H Kerrie Buffalo, NP      . alum & mag hydroxide-simeth (MAALOX/MYLANTA) 200-200-20 MG/5ML suspension 30 mL  30 mL Oral Q4H PRN Kerrie Buffalo, NP      . chlordiazePOXIDE (LIBRIUM) capsule 5 mg  5 mg Oral QID PRN Kerrie Buffalo, NP   5 mg at 09/23/14 0537  . DULoxetine (CYMBALTA) DR capsule 30 mg  30 mg Oral Daily Ursula Alert, MD   30 mg at 09/23/14 0845  . eszopiclone (LUNESTA) tablet 2 mg  2 mg Oral QHS Kerrie Buffalo, NP   2 mg at 09/22/14 2151  . hydrOXYzine (ATARAX/VISTARIL) tablet 25 mg  25 mg Oral QHS PRN Kerrie Buffalo, NP      . lidocaine (LIDODERM) 5 % 1 patch  1 patch Transdermal Q24H Kerrie Buffalo, NP   1 patch at 09/22/14 1307  . loperamide (IMODIUM) capsule 2-4 mg  2-4 mg Oral PRN Kerrie Buffalo, NP   4 mg at 09/23/14 0935  . magnesium hydroxide (MILK OF MAGNESIA) suspension 30 mL  30 mL Oral  Daily PRN Kerrie Buffalo, NP      . multivitamin with minerals tablet 1 tablet  1 tablet Oral Daily Kerrie Buffalo, NP   1 tablet at 09/23/14 0929  . naproxen (NAPROSYN) tablet 500 mg  500 mg Oral BID WC Kerrie Buffalo, NP   500 mg at 09/23/14 0845  . nicotine (NICODERM CQ - dosed in mg/24 hours) patch 21 mg  21 mg Transdermal Q0600 Nicholaus Bloom, MD   21 mg at 09/23/14 0102  . oxyCODONE (Oxy IR/ROXICODONE) immediate release tablet 5 mg  5 mg Oral Q8H PRN Kerrie Buffalo, NP   5 mg at 09/23/14 0932  . potassium chloride (K-DUR,KLOR-CON) CR tablet 10 mEq  10 mEq Oral BID Kerrie Buffalo, NP   10 mEq at 09/23/14 0845  . thiamine (VITAMIN B-1) tablet 100 mg  100  mg Oral Daily Kerrie Buffalo, NP   100 mg at 09/23/14 7867    Lab Results:  Results for orders placed or performed during the hospital encounter of 09/21/14 (from the past 48 hour(s))  TSH     Status: None   Collection Time: 09/23/14  6:30 AM  Result Value Ref Range   TSH 0.783 0.350 - 4.500 uIU/mL    Comment: Performed at [facility]    Physical Findings: AIMS: Facial and Oral Movements Muscles of Facial Expression: None, normal Lips and Perioral Area: None, normal Jaw: None, normal Tongue: None, normal,Extremity Movements Upper (arms, wrists, hands, fingers): None, normal Lower (legs, knees, ankles, toes): None, normal, Trunk Movements Neck, shoulders, hips: None, normal, Overall Severity Severity of abnormal movements (highest score from questions above): None, normal Incapacitation due to abnormal movements: None, normal Patient's awareness of abnormal movements (rate only patient's report): No Awareness, Dental Status Current problems with teeth and/or dentures?: No Does patient usually wear dentures?: No  CIWA:  CIWA-Ar Total: 4 COWS:  COWS Total Score: 8  Treatment Plan Summary: Patient will benefit from inpatient treatment and stabilization.  Estimated length of stay is 5-7 days.  Reviewed past  medical records,treatment plan.  (DC Elavil- 2/2 lack of efficacy as well as hx of suicide attempts - TCA 's may not be the right choice in this type of patient population.) Start Clonidine protocol/CIWA Started Zanaflex 2 mg TID PRN muscle spasticity Continue Lidoderm patch Increased Cymbalta 60 mg po daily for affetcive sx. Ondansetron 4 mg PRN nausea Add Lunesta 2 mg po qhs for sleep. DC Librium 25 mg per CIWA score >10 Hypokalemia - Kdur 10 meq x 4 doses - get BMP repeat.TSH, urine preg test. Patient wanting pain management - will provide alternative pain management options at this time . Pt reports she would talk to her PCP and get back to [place] whether he would continue her on opioid pain medications on DC. Will continue to monitor vitals ,medication compliance and treatment side effects while patient is here.  Will monitor for medical issues as well as call consult as needed.  Reviewed labs ,will order as needed.  CSW will start working on disposition.  Patient to participate in therapeutic milieu  Medical Decision Making:  Review of Psycho-Social Stressors (1), Discuss test with performing physician (1), Review and summation of old records (2) and Review of Medication Regimen & Side Effects (2)  Freda Munro May Agustin AGNP-BC 09/23/2014, 11:38 AM  I reviewed chart and agreed with the findings and treatment Plan.  Berniece Andreas, MD

## 2014-09-23 NOTE — Progress Notes (Signed)
Sent to X-ray by [employer] @ [phone]

## 2014-09-23 NOTE — ED Provider Notes (Signed)
CSN: [id number]     Arrival date & time 09/23/14  2014 History   First MD Initiated Contact with Patient 09/23/14 2139     Chief Complaint  Patient presents with  . Swallowed Foreign Body    HPI   34 year old female presents from behavioral health today with swallowed foreign body. Patient reports that she is making a tongue ring with a piece of metal when she swallowed it. She reports that this was proximal to 6 hours prior to arrival in the ED. At this time I evaluation patient reports she has mid abdominal pain. She denies nausea, vomiting, lower abdominal pain, rectal bleeding. No history of significant abdominal pathology. Patient has a prior medical past history of opioid use disorder, drug dependency, and chronic abdominal pain.  Past Medical History  Diagnosis Date  . Migraine   . GERD (gastroesophageal reflux disease)     protonix evenings  . Endometriosis     chronic pain  . Anxiety     klonipin for stress disorder  . Degenerative disc disease   . Chronic back pain    Past Surgical History  Procedure Laterality Date  . Appendectomy    . Tonsillectomy    . Tubal ligation    . Laparoscopic assisted vaginal hysterectomy  08/19/2011    Procedure: LAPAROSCOPIC ASSISTED VAGINAL HYSTERECTOMY;  Surgeon: Gus Height, MD;  Location: [facility];  Service: Gynecology;  Laterality: N/A;  . Abdominal hysterectomy     Family History  Problem Relation Age of Onset  . Coronary artery disease Father   . Coronary artery disease Maternal Grandmother   . Breast cancer Mother   . Cancer Mother    Social History  Substance Use Topics  . Smoking status: Current Every Day Smoker -- 1.00 packs/day for 14 years    Types: Cigarettes  . Smokeless tobacco: None  . Alcohol Use: No   OB History    Gravida Para Term Preterm AB TAB SAB Ectopic Multiple Living   6 2             Review of Systems  All other systems reviewed and are negative.   Allergies  Epidural tray 17gx3-1-2"; Ibuprofen;  Penicillins; and Zofran  Home Medications   Prior to Admission medications   Medication Sig Start Date End Date Taking? Authorizing Provider  amitriptyline (ELAVIL) 100 MG tablet take 1-3 tablets by mouth at bedtime for depression sleep or pain 07/17/14  Yes Historical Provider, MD  FLUoxetine (PROZAC) 40 MG capsule Take 2 capsules (80 mg total) by mouth daily. 07/13/13  Yes Benjamine Mola, FNP  gabapentin (NEURONTIN) 100 MG capsule Take 1 capsule (100 mg total) by mouth 3 (three) times daily. 07/13/13  Yes Benjamine Mola, FNP  OxyCODONE HCl ER (OXYCONTIN) 60 MG T12A Take 60 mg by mouth 2 (two) times daily.   Yes Historical Provider, MD  pantoprazole (PROTONIX) 40 MG tablet Take 1 tablet (40 mg total) by mouth daily. 07/13/13  Yes John Johnn Hai, FNP  ABILIFY 10 MG tablet Take 10 mg by mouth every morning.  04/04/14   Historical Provider, MD  cloNIDine (CATAPRES) 0.3 MG tablet take 1 tablet by mouth every 6 hours if needed 09/19/14   Historical Provider, MD  oxymorphone (OPANA) 10 MG tablet take 1 tablet by mouth every 8 hours if needed for pain 09/15/14   Historical Provider, MD  polyethylene glycol (MIRALAX / GLYCOLAX) packet Take 17 g by mouth 2 (two) times daily as needed (constipation). 09/24/14  Dellis Filbert Kashtyn Jankowski, PA-C  potassium chloride 20 MEQ TBCR Take 20 mEq by mouth daily. 09/21/14   Robbie Lis, MD  VYVANSE 70 MG capsule Take 70 mg by mouth once daily 07/17/14   Historical Provider, MD   BP 122/91 mmHg  Pulse 107  Temp(Src) 97.7 F (36.5 C) (Oral)  Resp 18  Ht 5\' 2"  (1.575 m)  Wt 186 lb (84.369 kg)  BMI 34.01 kg/m2  SpO2 100%  LMP 08/10/2011   Physical Exam  Constitutional: She is oriented to person, place, and time. She appears well-developed and well-nourished.  HENT:  Head: Normocephalic and atraumatic.  Eyes: Conjunctivae are normal. Pupils are equal, round, and reactive to light. Right eye exhibits no discharge. Left eye exhibits no discharge. No scleral icterus.  Neck: Normal  range of motion. No JVD present. No tracheal deviation present.  Cardiovascular: Regular rhythm, normal heart sounds and intact distal pulses.  Exam reveals no gallop and no friction rub.   No murmur heard. Pulmonary/Chest: Effort normal and breath sounds normal. No stridor. No respiratory distress. She has no wheezes. She has no rales. She exhibits no tenderness.  Abdominal: Soft. She exhibits no distension and no mass. There is tenderness. There is no rebound and no guarding.  Musculoskeletal: Normal range of motion. She exhibits no edema or tenderness.  Neurological: She is alert and oriented to person, place, and time. Coordination normal.  Skin: Skin is warm and dry.  Psychiatric: She has a normal mood and affect. Her behavior is normal. Judgment and thought content normal.  Nursing note and vitals reviewed.   ED Course  Procedures (including critical care time) Labs Review Labs Reviewed  CBC WITH DIFFERENTIAL/PLATELET - Abnormal; Notable for the following:    WBC 12.0 (*)    HCT 35.8 (*)    Neutro Abs 8.6 (*)    All other components within normal limits  TSH  PREGNANCY, URINE  BASIC METABOLIC PANEL  I-STAT BETA HCG BLOOD, ED ([facility], [facility], [facility] ONLY)    Imaging Review Ct Abdomen Pelvis Wo Contrast  09/24/2014   CLINICAL DATA:  Left upper quadrant abdominal pain, after swallowing metal ring from eyebrow brush. Initial encounter.  EXAM: CT ABDOMEN AND PELVIS WITHOUT CONTRAST  TECHNIQUE: Multidetector CT imaging of the abdomen and pelvis was performed following the standard protocol without IV contrast.  COMPARISON:  Abdominal radiograph performed earlier today at 8:24 p.m., and CT of the abdomen and pelvis from 07/06/2013  FINDINGS: The visualized lung bases are clear.  The liver and spleen are unremarkable in appearance. The gallbladder is within normal limits. The pancreas and adrenal glands are unremarkable.  The kidneys are unremarkable in appearance. There is no evidence of  hydronephrosis. No renal or ureteral stones are seen. No perinephric stranding is appreciated.  The previously noted metallic foreign body overlying the right mid abdomen is now seen at the right hemipelvis, within the distal ileum. This appears to be progressing normally through the bowel, likely due to its relatively round edges.  No free fluid is identified. The small bowel is unremarkable in appearance. The stomach is within normal limits. No acute vascular abnormalities are seen.  The patient is status post appendectomy. The colon is unremarkable in appearance.  The bladder is mildly distended and grossly unremarkable in appearance. The patient is status post hysterectomy. No suspicious adnexal masses are seen. The ovaries are relatively symmetric. No inguinal lymphadenopathy is seen.  No acute osseous abnormalities are identified.  IMPRESSION: Previously noted metallic foreign  body overlying the right mid abdomen is now seen at the right hemipelvis, within the distal ileum. This appears to be progressing normally through the bowel at this time, likely due to its relatively round edges.   Electronically Signed   By: Garald Balding M.D.   On: 09/24/2014 00:47   Dg Abd 1 View  09/23/2014   CLINICAL DATA:  Swallowed a metal tongue ring with hook attached today at 4 p.m. Previous appendectomy and hysterectomy.  EXAM: ABDOMEN - 1 VIEW  COMPARISON:  Abdomen and pelvis CT dated 07/06/2013.  FINDINGS: Curvilinear metallic foreign body in the mid abdomen to the right of midline. Normal bowel gas pattern. Normal appearing bones.  IMPRESSION: Curvilinear metallic foreign body in the mid abdomen to the right of midline.   Electronically Signed   By: Claudie Revering M.D.   On: 09/23/2014 21:13   I have personally reviewed and evaluated these images and lab results as part of my medical decision-making.   EKG Interpretation None      MDM   Final diagnoses:  Swallowed foreign body, initial encounter    Labs:  I-STAT beta-hCG, CBC, BMP-   Imaging: DG abdomen 1 view, CT abdomen without- metallic foreign body previously seen in the right mid abdomen and is now at the right hemipelvis within the distal ileum  Consults:   Therapeutics:   Discharge Meds: MiraLAX  Assessment/Plan: Patient presents with a swallowed foreign body. 6 hours after ingestion it was mid abdomen, curvilinear shape. Consulted gastroenterology and advised to further assess with CT abdomen. They felt that the location of the object at this time was beyond the reach of them and if object needed to be removed likely surgery would need to be consult. They informed me that most foreign objects will pass with the addition of MiraLAX. CT scan shows that the foreign object is passing normally through the bowel, this is likely due to its relative round edges. Patient will be discharged home with MiraLAX, instructions to monitor for passage of the foreign object. If object does not pass in 2 days she is instructed to have repeat abdominal x-rays. She is given strict return precautions the event new or worsening signs or symptoms present, rectal bleeding, severe abdominal pain. She verbalizes understanding and agreement to today's plan and had no further questions or concerns at the time of discharge.          Okey Regal, PA-C 09/24/14 0109  Evelina Bucy, MD 09/24/14 [phone]

## 2014-09-23 NOTE — B[facility] Group Notes (Signed)
[facility] LCSW Group Therapy 09/23/2014  2 PM   Type of Therapy: Group Therapy  Participation Level: Did Not Attend. Patient invited to participate but declined.   Tilden Fossa, MSW, [place] Worker [facility] [phone]

## 2014-09-23 NOTE — Progress Notes (Signed)
Note:  MHTChaney was monitoring the 400 hall and the Pt. Dhara Schepp was on the phone with her family.  The pt.  Khole stated to the listener that they took me off everything. Can you please bring me something.  Then pt. Stated, is ok for you to be out there doing but I don't. Then pt. Stated just one please.  Then pt. Stated visit is from 6:30 to 8:00pm.  Will you please, I'm begging you.  Then pt. Throw kisses and ask are you coming to see me today.  MHT did not here noting else.  MHT reported to the (RN) that instructed to please document.

## 2014-09-23 NOTE — ED Notes (Addendum)
PT from [facility] c/o left upper abdominal pain. She reports she made an a tongue ring out of a eyebrow brush. She reports she took all the bristles out and metal remained with some hooks present. She reports that she swallowed the contraption about 4 ours ago. [place] Sitter with patient

## 2014-09-24 LAB — BASIC METABOLIC PANEL
Anion gap: 8 (ref 5–15)
BUN: 17 mg/dL (ref 6–20)
CALCIUM: 9.2 mg/dL (ref 8.9–10.3)
CHLORIDE: 106 mmol/L (ref 101–111)
CO2: 24 mmol/L (ref 22–32)
CREATININE: 0.67 mg/dL (ref 0.44–1.00)
GFR calc Af Amer: >60 mL/min (ref >60)
GFR calc non Af Amer: >60 mL/min (ref >60)
GLUCOSE: 124 mg/dL — AB (ref 65–99)
Potassium: 3.6 mmol/L (ref 3.5–5.1)
Sodium: 138 mmol/L (ref 135–145)

## 2014-09-24 MED ORDER — ESZOPICLONE 2 MG PO TABS: 3.0000 mg | ORAL_TABLET | Freq: Every day | ORAL | Status: DC

## 2014-09-24 MED ORDER — KETOROLAC TROMETHAMINE 30 MG/ML IJ SOLN
30.0000 mg | Freq: Once | INTRAMUSCULAR | Status: AC
  Administered 2014-09-24: 30 mg via INTRAMUSCULAR
  Filled 2014-09-24: qty 1

## 2014-09-24 MED ORDER — ZOLPIDEM TARTRATE 5 MG PO TABS: 5.0000 mg | ORAL_TABLET | Freq: Every evening | ORAL | Status: DC | PRN

## 2014-09-24 MED ORDER — ESZOPICLONE 2 MG PO TABS
2.0000 mg | ORAL_TABLET | Freq: Every day | ORAL | Status: DC
  Administered 2014-09-24 – 2014-09-26 (×3): 2 mg via ORAL
  Filled 2014-09-24 (×3): qty 1

## 2014-09-24 MED ORDER — NON FORMULARY: 2.0000 mg | Freq: Every day | Status: DC

## 2014-09-24 MED ORDER — POLYETHYLENE GLYCOL 3350 17 G PO PACK: 17.0000 g | PACK | Freq: Two times a day (BID) | ORAL | Status: DC | PRN

## 2014-09-24 MED ORDER — ESZOPICLONE 2 MG PO TABS: 1.0000 mg | ORAL_TABLET | Freq: Every evening | ORAL | Status: AC | PRN

## 2014-09-24 NOTE — Progress Notes (Signed)
Adult Psychoeducational Group Note  Date:  09/24/2014 Time:  9:11 PM  Group Topic/Focus:  Wrap-Up Group:   The focus of this group is to help patients review their daily goal of treatment and discuss progress on daily workbooks.  Participation Level:  Active  Participation Quality:  Appropriate  Affect:  Appropriate  Cognitive:  Appropriate  Insight: Appropriate  Engagement in Group:  Engaged  Modes of Intervention:  Discussion  Additional Comments: The patient expressed that she rates her day a 8.The patient also said that she attended group.  Nash Shearer 09/24/2014, 9:11 PM

## 2014-09-24 NOTE — BHH Group Notes (Signed)
[facility] LCSW Group Therapy  09/24/2014 1:15 PM   Type of Therapy:  Group Therapy  Participation Level:  Did Not Attend - pt observed waiting in the hall and reports that she is waiting for her lunch, so she is unable to attend group  Regan Lemming, LCSW 09/24/2014 3:00 PM

## 2014-09-24 NOTE — Discharge Instructions (Signed)
Please monitor for passage of foreign body, it does not pass in the next 2 days he will need repeat imaging of her abdomen. Please monitor for new or worsening signs or symptoms return if any present. Please read attached information

## 2014-09-24 NOTE — Progress Notes (Signed)
D) Pt remains on her 1:1. Lying in her bed asleep with even unlabored breathing. Did get up for her medications and complained of needing to have a bowel movement and wanted a laxative. States "I am not having any withdrawal symptoms. I just can't believe it. All I got was Toradol last night". Pt reports that the Doc at the ED said she would pass the object she swallowed within 3 days, and if not she would have to see an Internist  Pt denies SI and HI. Delusions and hallucinations.  A) Given support and reassurance. Remains on a 1:1.  R) Remains safe.

## 2014-09-24 NOTE — Progress Notes (Signed)
Pt back on the unit after ED visits for X-ray and CT scan. Per report from ED nurse, X-ray reveals ingested foreign object. CT shows progression of foreign object through the GI tract. Pt to be monitored for possible GI bleed. Support, encouragement, and safe environment provided.  15-minute safety checks continue.

## 2014-09-24 NOTE — ED Notes (Signed)
[facility] Floor Unit RN was called and given report on pt's discharge and instructions.

## 2014-09-24 NOTE — Progress Notes (Signed)
D) Pt continues to complain about not having a BM and is wanting to have an enema or a suppository. Affect is brighter and Pt continues to state that she is not having any withdrawal symptoms. Expresses joy over the fact she is not having difficulty. Has stayed in her room due to the need to have a BM.  A) Given support, reassurance and praise given.  R) Remains safe.

## 2014-09-24 NOTE — Progress Notes (Signed)
Currently sitting in dayroom. 1:1 sitter noted at pts side. Pt has asked for her toiletries and they have been supplied per NP. Otherwise she is doing well and offers no questions or concerns. She is compliant with 1:1 supervision. Will continue current POC.

## 2014-09-24 NOTE — Progress Notes (Signed)
Patient ID: [name] [name], female   DOB: [birth year], 34 y.o.   MRN: [id number] [facility] MD Progress Note  09/24/2014 5:10 PM [name] [name]  MRN:  [id number] Subjective:  Patient states that she is constipated.  She remains fixated on her pain and is asking to receive more medications.  Patient is in her room.  Abd Xray showed an foreign object that will pass out of her system in a few days.  She "swalloed part of a makeup brush".  Admitted this was an attempt to obtain pain medications.  She was also overheard by staff asking for family members to bring her pain meds.  She Visitation privileges held for today.  Principal Problem: MDD (major depressive disorder), recurrent severe, without psychosis Diagnosis:   Patient Active Problem List   Diagnosis Date Noted  . MDD (major depressive disorder), recurrent severe, without psychosis [F33.2] 09/22/2014  . Opioid use disorder, severe, dependence [F11.20] 09/22/2014  . Hypokalemia [E87.6] 09/22/2014  . Tylenol overdose [T39.1X4A] 09/19/2014  . Suicide attempt [T14.91] 09/19/2014  . GERD (gastroesophageal reflux disease) [K21.9] 09/19/2014  . Drug dependence [F19.20]   . Polysubstance (including opioids) dependence with physiological dependence [F19.20] 07/08/2013  . Suicidal ideation [R45.851] 07/06/2013  . Nausea & vomiting [R11.2] 07/06/2013  . Chronic pain disorder [G89.4] 07/06/2013  . Cocaine abuse [F14.10] 07/06/2013  . Major depression [F32.2] 01/05/2012  . Alcohol dependence [F10.20] 01/04/2012  . Opioid dependence [F11.20] 12/29/2011  . Back pain [M54.9] 12/29/2011  . N&V (nausea and vomiting) [R11.2] 06/21/2011  . Tachycardia [R00.0] 06/21/2011  . Abdominal pain [R10.9] 06/21/2011  . Leukocytosis [D72.829] 06/21/2011   Total Time spent with patient: 30 minutes   Past Medical History:  Past Medical History  Diagnosis Date  . Migraine   . GERD (gastroesophageal reflux disease)     protonix evenings  . Endometriosis     chronic  pain  . Anxiety     klonipin for stress disorder  . Degenerative disc disease   . Chronic back pain     Past Surgical History  Procedure Laterality Date  . Appendectomy    . Tonsillectomy    . Tubal ligation    . Laparoscopic assisted vaginal hysterectomy  08/19/2011    Procedure: LAPAROSCOPIC ASSISTED VAGINAL HYSTERECTOMY;  Surgeon: Gus Height, MD;  Location: [place] [facility];  Service: Gynecology;  Laterality: N/A;  . Abdominal hysterectomy     Family History:  Family History  Problem Relation Age of Onset  . Coronary artery disease Father   . Coronary artery disease Maternal Grandmother   . Breast cancer Mother   . Cancer Mother    Social History:  History  Alcohol Use No     History  Drug Use No    Comment: denies    Social History   Social History  . Marital Status: Married    Spouse Name: N/A  . Number of Children: N/A  . Years of Education: N/A   Social History Main Topics  . Smoking status: Current Every Day Smoker -- 1.00 packs/day for 14 years    Types: Cigarettes  . Smokeless tobacco: None  . Alcohol Use: No  . Drug Use: No     Comment: denies  . Sexual Activity: Yes   Other Topics Concern  . None   Social History Narrative   Additional History:    Sleep: Fair  Appetite:  Fair  Assessment:  [name] [name], 34 y.o., with hx of substance abuse.  She was seen in the ED  for attempted overdose on Acetaminophen.    Musculoskeletal: Strength & Muscle Tone: within normal limits Gait & Station: normal Patient leans: N/A   Psychiatric Specialty Exam: Physical Exam  Vitals reviewed. Psychiatric: Her mood appears anxious. Her affect is labile. She exhibits a depressed mood.    Review of Systems  All other systems reviewed and are negative.   Blood pressure 138/81, pulse 92, temperature 98.2 F (36.8 C), temperature source Oral, resp. rate 16, height $RemoveBe'5\' 2"'dWcJWcRjs$  (1.575 m), weight 84.369 kg (186 lb), last menstrual period 08/10/2011, SpO2 100 %.Body mass index  is 34.01 kg/(m^2).   General Appearance: Casual,Neat  Eye Contact:: Fair  Speech: Clear and Coherent  Volume: Normal  Mood: Anxious and Depressed  Affect: Appropriate  Thought Process: Coherent  Orientation: Full (Time, Place, and Person)  Thought Content: Rumination, Fixated on her pain meds-persistent  Suicidal Thoughts: No  Homicidal Thoughts: No  Memory: Immediate; Fair Recent; Fair Remote; Fair  Judgement: Impaired  Insight: Shallow  Psychomotor Activity: Normal  Concentration: Fair  Recall: [employer] of Knowledge:Fair  Language: Fair  Akathisia: No  Handed: Right  AIMS (if indicated):    Assets: Communication Skills Desire for Improvement  Sleep: Number of Hours: 6  Cognition: WNL  ADL's: Intact       Current Medications: Current Facility-Administered Medications  Medication Dose Route Frequency Provider Last Rate Last Dose  . acetaminophen (TYLENOL) tablet 650 mg  650 mg Oral Q6H PRN Kerrie Buffalo, NP   650 mg at 09/23/14 0615  . acyclovir ointment (ZOVIRAX) 5 %   Topical Q3H Kerrie Buffalo, NP      . alum & mag hydroxide-simeth (MAALOX/MYLANTA) 200-200-20 MG/5ML suspension 30 mL  30 mL Oral Q4H PRN Kerrie Buffalo, NP   30 mL at 09/24/14 0937  . cloNIDine (CATAPRES) tablet 0.1 mg  0.1 mg Oral QID Kerrie Buffalo, NP   0.1 mg at 09/24/14 1257   Followed by  . [START ON 09/25/2014] cloNIDine (CATAPRES) tablet 0.1 mg  0.1 mg Oral BH-qamhs Kerrie Buffalo, NP       Followed by  . [START ON 09/28/2014] cloNIDine (CATAPRES) tablet 0.1 mg  0.1 mg Oral QAC breakfast Kerrie Buffalo, NP      . dicyclomine (BENTYL) tablet 20 mg  20 mg Oral Q6H PRN Kerrie Buffalo, NP      . DULoxetine (CYMBALTA) DR capsule 60 mg  60 mg Oral Daily Kerrie Buffalo, NP   60 mg at 09/24/14 0743  . eszopiclone (LUNESTA) tablet 2 mg  2 mg Oral QHS Kerrie Buffalo, NP   2 mg at 09/24/14 0300  . gabapentin (NEURONTIN) capsule 300 mg  300 mg Oral TID  Kerrie Buffalo, NP   300 mg at 09/24/14 1257  . hydrOXYzine (ATARAX/VISTARIL) tablet 25 mg  25 mg Oral QHS PRN Kerrie Buffalo, NP      . lidocaine (LIDODERM) 5 % 1 patch  1 patch Transdermal Q24H Kerrie Buffalo, NP   1 patch at 09/24/14 1200  . loperamide (IMODIUM) capsule 2-4 mg  2-4 mg Oral PRN Kerrie Buffalo, NP   4 mg at 09/23/14 0935  . magnesium hydroxide (MILK OF MAGNESIA) suspension 30 mL  30 mL Oral Daily PRN Kerrie Buffalo, NP      . multivitamin with minerals tablet 1 tablet  1 tablet Oral Daily Kerrie Buffalo, NP   1 tablet at 09/24/14 0743  . naproxen (NAPROSYN) tablet 500 mg  500 mg Oral BID WC Kerrie Buffalo, NP   500  mg at 09/24/14 0743  . nicotine (NICODERM CQ - dosed in mg/24 hours) patch 21 mg  21 mg Transdermal Q0600 Nicholaus Bloom, MD   21 mg at 09/24/14 0646  . thiamine (VITAMIN B-1) tablet 100 mg  100 mg Oral Daily Kerrie Buffalo, NP   100 mg at 09/24/14 0743  . tiZANidine (ZANAFLEX) tablet 2 mg  2 mg Oral TID PRN Kerrie Buffalo, NP        Lab Results:  Results for orders placed or performed during the hospital encounter of 09/21/14 (from the past 48 hour(s))  TSH     Status: None   Collection Time: 09/23/14  6:30 AM  Result Value Ref Range   TSH 0.783 0.350 - 4.500 uIU/mL    Comment: Performed at [facility]  CBC with Differential     Status: Abnormal   Collection Time: 09/23/14 11:14 PM  Result Value Ref Range   WBC 12.0 (H) 4.0 - 10.5 K/uL   RBC 4.03 3.87 - 5.11 MIL/uL   Hemoglobin 12.0 12.0 - 15.0 g/dL   HCT 35.8 (L) 36.0 - 46.0 %   MCV 88.8 78.0 - 100.0 fL   MCH 29.8 26.0 - 34.0 pg   MCHC 33.5 30.0 - 36.0 g/dL   RDW 13.4 11.5 - 15.5 %   Platelets 322 150 - 400 K/uL   Neutrophils Relative % 72 43 - 77 %   Neutro Abs 8.6 (H) 1.7 - 7.7 K/uL   Lymphocytes Relative 23 12 - 46 %   Lymphs Abs 2.8 0.7 - 4.0 K/uL   Monocytes Relative 4 3 - 12 %   Monocytes Absolute 0.5 0.1 - 1.0 K/uL   Eosinophils Relative 1 0 - 5 %   Eosinophils Absolute 0.1  0.0 - 0.7 K/uL   Basophils Relative 0 0 - 1 %   Basophils Absolute 0.0 0.0 - 0.1 K/uL  I-Stat Beta hCG blood, ED ([facility], [facility], [facility] only)     Status: None   Collection Time: 09/23/14 11:19 PM  Result Value Ref Range   I-stat hCG, quantitative <5.0 <5 mIU/mL   Comment 3            Comment:   GEST. AGE      CONC.  (mIU/mL)   <=1 WEEK        5 - 50     2 WEEKS       50 - 500     3 WEEKS       100 - 10,000     4 WEEKS     1,000 - 30,000        FEMALE AND NON-PREGNANT FEMALE:     LESS THAN 5 mIU/mL   Basic metabolic panel     Status: Abnormal   Collection Time: 09/24/14  6:38 AM  Result Value Ref Range   Sodium 138 135 - 145 mmol/L   Potassium 3.6 3.5 - 5.1 mmol/L   Chloride 106 101 - 111 mmol/L   CO2 24 22 - 32 mmol/L   Glucose, Bld 124 (H) 65 - 99 mg/dL   BUN 17 6 - 20 mg/dL   Creatinine, Ser 0.67 0.44 - 1.00 mg/dL   Calcium 9.2 8.9 - 10.3 mg/dL   GFR calc non Af Amer >60 >60 mL/min   GFR calc Af Amer >60 >60 mL/min    Comment: (NOTE) The eGFR has been calculated using the CKD EPI equation. This calculation has not been validated in all  clinical situations. eGFR's persistently <60 mL/min signify possible Chronic Kidney Disease.    Anion gap 8 5 - 15    Comment: Performed at [facility]    Physical Findings: AIMS: Facial and Oral Movements Muscles of Facial Expression: None, normal Lips and Perioral Area: None, normal Jaw: None, normal Tongue: None, normal,Extremity Movements Upper (arms, wrists, hands, fingers): None, normal Lower (legs, knees, ankles, toes): None, normal, Trunk Movements Neck, shoulders, hips: None, normal, Overall Severity Severity of abnormal movements (highest score from questions above): None, normal Incapacitation due to abnormal movements: None, normal Patient's awareness of abnormal movements (rate only patient's report): No Awareness, Dental Status Current problems with teeth and/or dentures?: No Does patient usually wear  dentures?: No  CIWA:  CIWA-Ar Total: 5 COWS:  COWS Total Score: 2  Treatment Plan Summary: Patient will benefit from inpatient treatment and stabilization.  Estimated length of stay is 5-7 days.  Reviewed past medical records,treatment plan.  (DC Elavil- 2/2 lack of efficacy as well as hx of suicide attempts - TCA 's may not be the right choice in this type of patient population.) Start Clonidine protocol/CIWA Started Zanaflex 2 mg TID PRN muscle spasticity Continue Lidoderm patch Increased Cymbalta 60 mg po daily for affetcive sx. Ondansetron 4 mg PRN nausea Add Lunesta 2 mg po qhs for sleep. DC Librium 25 mg per CIWA score >10 Hypokalemia - Kdur 10 meq x 4 doses - get BMP repeat.TSH, urine preg test. Patient wanting pain management - will provide alternative pain management options at this time . Pt reports she would talk to her PCP and get back to [place] whether he would continue her on opioid pain medications on DC. Will continue to monitor vitals ,medication compliance and treatment side effects while patient is here.  Will monitor for medical issues as well as call consult as needed.  Reviewed labs ,will order as needed.  CSW will start working on disposition.  Patient to participate in therapeutic milieu  Medical Decision Making:  Review of Psycho-Social Stressors (1), Discuss test with performing physician (1), Review and summation of old records (2) and Review of Medication Regimen & Side Effects (2)  Freda Munro May Agustin AGNP-BC 09/24/2014, 5:10 PM   I reviewed chart and agreed with the findings and treatment Plan.  Berniece Andreas, MD

## 2014-09-24 NOTE — Progress Notes (Signed)
D) Pt has been doing well on her 1:1. Denies SI and HI and has not made any gestures to hurt herself. Continues to not have issues with withdrawal. States that she is feeling safe. Continues to be working on having a bowel movement so that she can pass the object she swallowed yesterday. Affect is a little brighter and mood is less depressed.  A) given support reassurance and praise. Encouragement given as well. Provided with a 1:1.  R) Remains on a 1:1 for her safety. Has not passed the object she swallowed yesterday.

## 2014-09-24 NOTE — ED Notes (Signed)
[facility] here to transport pt back to [facility]. 

## 2014-09-24 NOTE — Progress Notes (Signed)
Patient ID: [name] [name], female   DOB: 07/20/1980, 34 y.o.   MRN: [id number] D: Client visible on the unit, in dayroom watching TV, also noted playing cards interacting appropriately with peers and staff. "I feel a lot better, not too depressed having a great day, think it's a miracle I'm not sick as a dog" "I tried to do this before at home and I was sick" Client talks about incident that sent her to the ED "I made a tongue ring out of the metal on one of my make up containers and when I was drinking it went down my throat" "I had swallowed it a few hours before I told any one, but my stomach started hurting so I told it, had to go to the ED" A: Writer encouraged client to report any concerns, reviewed medication, administered as prescribed. Staff will monitor 1:1 for safety. R: Client is safe on the unit, attended group.

## 2014-09-24 NOTE — ED Notes (Signed)
[facility] was called for pt's transfer to [facility].

## 2014-09-25 MED ORDER — NAPROXEN 500 MG PO TABS
500.0000 mg | ORAL_TABLET | Freq: Two times a day (BID) | ORAL | Status: DC | PRN
  Administered 2014-09-25 – 2014-09-27 (×4): 500 mg via ORAL
  Filled 2014-09-25 (×4): qty 1

## 2014-09-25 MED ORDER — BACITRACIN-NEOMYCIN-POLYMYXIN OINTMENT TUBE
TOPICAL_OINTMENT | CUTANEOUS | Status: DC | PRN
  Administered 2014-09-25: 1 via TOPICAL
  Administered 2014-09-26: 06:00:00 via TOPICAL
  Filled 2014-09-25 (×2): qty 15

## 2014-09-25 MED ORDER — HYDROXYZINE HCL 50 MG PO TABS
50.0000 mg | ORAL_TABLET | Freq: Every evening | ORAL | Status: DC | PRN
  Administered 2014-09-25 – 2014-09-26 (×2): 50 mg via ORAL
  Filled 2014-09-25 (×2): qty 1

## 2014-09-25 NOTE — [facility] Group Notes (Signed)
[facility] LCSW Aftercare Discharge Planning Group Note  09/25/2014 8:45 AM  Participation Quality: Alert, Appropriate and Oriented  Mood/Affect: Flat  Depression Rating: 0  Anxiety Rating: 0  Thoughts of Suicide: Pt denies SI/HI  Will you contract for safety? Yes  Current AVH: Pt denies  Plan for Discharge/Comments: Pt attended discharge planning group and actively participated in group. CSW discussed suicide prevention education with the group and encouraged them to discuss discharge planning and any relevant barriers. Pt denies depression and anxiety and reports that she does not have a current outpatient provider.  Transportation Means: Pt reports access to transportation  Supports: No supports mentioned at this time  Peri Maris, [place] 09/25/2014 9:17 AM

## 2014-09-25 NOTE — Progress Notes (Signed)
D:Pt is visible and participating in the milieu, groups and activities. Cooperative with 1:1 OBS. A:Support and encouragement offered. R:Receptive. Safety maintained on Level 1 precautions.

## 2014-09-25 NOTE — Progress Notes (Signed)
D:Affect is sad,mood is depressed. States that her goal for today is to attend all groups and activities. Says that overall her day is much better so far than yesterday.States slightly improved mood as well as physical pain is decreased. A:Support and encouragement offered.1:1 obs continued R:Receptive. Safety maintained.

## 2014-09-25 NOTE — Progress Notes (Signed)
D:Pt is visible and participating in the milieu, groups and activities. Cooperative with 1:1 OBS. A:Support and encouragement offered. R:Receptive. Safety maintained on Level 1 precautions. No complaints of pain or problems at this time.

## 2014-09-25 NOTE — Progress Notes (Signed)
Recreation Therapy Notes  Date: 09.05.2016 Time: 9:30am Location: [address] Group Room   Group Topic: Stress Management  Goal Area(s) Addresses:  Patient will actively participate in stress management techniques presented during session.   Behavioral Response: Appropriate   Intervention: Stress management techniques  Activity :  Deep Breathing and Guided Imagery. LRT provided instruction and demonstration on practice of Guided Imagery. Technique was coupled with deep breathing.   Education:  Stress Management, Discharge Planning.   Education Outcome: Acknowledges education  Clinical Observations/Feedback: Patient actively participated in technique introduced, voiced no concerns and demonstrated ability to practice independently post d/c.   Laureen Ochs Kyri Dai, LRT/CTRS  Sharline Lehane L 09/25/2014 1:29 PM

## 2014-09-25 NOTE — Progress Notes (Signed)
1:1 progress note:  Pt has been in the dayroom most of the evening conversing with her peers.  She has been polite and cooperative with staff thus far this evening.  Her husband and two sons came to visit with her in the cafeteria.  She sent the contents of her locker home with her husband and the belongings sheet was signed by Probation officer, pt, and her husband.  Pt asked about her medications that were available and if the MD had made any changes.  Writer and pt discussed her prn medications and when she could receive doses.  Pt denies SI/HI/AVH.  She voiced no other needs.  Support and encouragement offered.  Pt continues 1:1 for safety.  Sitter with pt.  Pt safe at this time.

## 2014-09-25 NOTE — Progress Notes (Signed)
Patient ID: [name] [name], female   DOB: [birth year], 34 y.o.   MRN: [id number] [facility] MD Progress Note  09/25/2014 2:18 PM [name] [name]  MRN:  [id number] Subjective:    Patient states "I overdosed on tylenol. I was having problems with my husband. I have talked to him and worked through some issues. I have a court date for Disability coming up. I really want to get off the 1:1. It was really just an accident and I was not trying to kill myself. I would like to go to the bathroom on my own."   Objective:  Patient is seen and chart is reviewed. Talked with nursing staff about the need to continue 1:1 observation due to concerns for potential self harm. Patient account of what happened differs from that documented in her chart. Will continue until seem by Primary Psychiatrist in the morning. Patient with recent suicide attempt on tylenol prior to admission.    Abd Xray showed an foreign object that will pass out of her system in a few days.  She "swallowed part of a makeup brush".  Admitted this was an attempt to obtain pain medications to weekend staff members. On admission the patient was very anxious over not being prescribed her oxy ER and opana. She was also overheard by staff asking for family members to bring her pain meds.  Her visitation privileges held yesterday.   Principal Problem: MDD (major depressive disorder), recurrent severe, without psychosis Diagnosis:   Patient Active Problem List   Diagnosis Date Noted  . MDD (major depressive disorder), recurrent severe, without psychosis [F33.2] 09/22/2014  . Opioid use disorder, severe, dependence [F11.20] 09/22/2014  . Hypokalemia [E87.6] 09/22/2014  . Tylenol overdose [T39.1X4A] 09/19/2014  . Suicide attempt [T14.91] 09/19/2014  . GERD (gastroesophageal reflux disease) [K21.9] 09/19/2014  . Drug dependence [F19.20]   . Polysubstance (including opioids) dependence with physiological dependence [F19.20] 07/08/2013  . Suicidal ideation  [R45.851] 07/06/2013  . Nausea & vomiting [R11.2] 07/06/2013  . Chronic pain disorder [G89.4] 07/06/2013  . Cocaine abuse [F14.10] 07/06/2013  . Major depression [F32.2] 01/05/2012  . Alcohol dependence [F10.20] 01/04/2012  . Opioid dependence [F11.20] 12/29/2011  . Back pain [M54.9] 12/29/2011  . N&V (nausea and vomiting) [R11.2] 06/21/2011  . Tachycardia [R00.0] 06/21/2011  . Abdominal pain [R10.9] 06/21/2011  . Leukocytosis [D72.829] 06/21/2011   Total Time spent with patient: 20 minutes   Past Medical History:  Past Medical History  Diagnosis Date  . Migraine   . GERD (gastroesophageal reflux disease)     protonix evenings  . Endometriosis     chronic pain  . Anxiety     klonipin for stress disorder  . Degenerative disc disease   . Chronic back pain     Past Surgical History  Procedure Laterality Date  . Appendectomy    . Tonsillectomy    . Tubal ligation    . Laparoscopic assisted vaginal hysterectomy  08/19/2011    Procedure: LAPAROSCOPIC ASSISTED VAGINAL HYSTERECTOMY;  Surgeon: [name] Height, MD;  Location: [facility];  Service: Gynecology;  Laterality: N/A;  . Abdominal hysterectomy     Family History:  Family History  Problem Relation Age of Onset  . Coronary artery disease Father   . Coronary artery disease Maternal Grandmother   . Breast cancer Mother   . Cancer Mother    Social History:  History  Alcohol Use No     History  Drug Use No    Comment: denies    Social History  Social History  . Marital Status: Married    Spouse Name: N/A  . Number of Children: N/A  . Years of Education: N/A   Social History Main Topics  . Smoking status: Current Every Day Smoker -- 1.00 packs/day for 14 years    Types: Cigarettes  . Smokeless tobacco: None  . Alcohol Use: No  . Drug Use: No     Comment: denies  . Sexual Activity: Yes   Other Topics Concern  . None   Social History Narrative   Additional History:    Sleep: Fair  Appetite:   Fair  Assessment:  [name] [name], 34 y.o., with hx of substance abuse.  She was seen in the ED for attempted overdose on Acetaminophen.    Musculoskeletal: Strength & Muscle Tone: within normal limits Gait & Station: normal Patient leans: N/A   Psychiatric Specialty Exam: Physical Exam  Vitals reviewed. Psychiatric: Her mood appears anxious. Her affect is labile. She exhibits a depressed mood.    Review of Systems  Constitutional: Negative.   HENT: Negative.   Eyes: Negative.   Respiratory: Negative.   Cardiovascular: Negative.   Gastrointestinal: Negative.   Genitourinary: Negative.   Musculoskeletal: Positive for back pain and joint pain.  Skin: Negative.   Neurological: Negative.   Endo/Heme/Allergies: Negative.   Psychiatric/Behavioral: Positive for depression. The patient is nervous/anxious.   All other systems reviewed and are negative.   Blood pressure 125/72, pulse 86, temperature 98.4 F (36.9 C), temperature source Oral, resp. rate 20, [name] $RemoveBe'5\' 2"'bobeMVcXf$  (1.575 m), weight 84.369 kg (186 lb), last menstrual period 08/10/2011, SpO2 100 %.Body mass index is 34.01 kg/(m^2).   General Appearance: Casual,Neat  Eye Contact:: Fair  Speech: Clear and Coherent  Volume: Normal  Mood: Anxious and Depressed  Affect: Appropriate  Thought Process: Coherent  Orientation: Full (Time, Place, and Person)  Thought Content: Rumination, Fixated on her pain meds-persistent  Suicidal Thoughts: No  Homicidal Thoughts: No  Memory: Immediate; Fair Recent; Fair Remote; Fair  Judgement: Impaired  Insight: Shallow  Psychomotor Activity: Normal  Concentration: Fair  Recall: [employer] of Knowledge:Fair  Language: Fair  Akathisia: No  Handed: Right  AIMS (if indicated):    Assets: Communication Skills Desire for Improvement  Sleep: Number of Hours: 6  Cognition: WNL  ADL's: Intact       Current Medications: Current  Facility-Administered Medications  Medication Dose Route Frequency Provider Last Rate Last Dose  . acetaminophen (TYLENOL) tablet 650 mg  650 mg Oral Q6H PRN Kerrie Buffalo, NP   650 mg at 09/25/14 0508  . acyclovir ointment (ZOVIRAX) 5 %   Topical Q3H Kerrie Buffalo, NP      . alum & mag hydroxide-simeth (MAALOX/MYLANTA) 200-200-20 MG/5ML suspension 30 mL  30 mL Oral Q4H PRN Kerrie Buffalo, NP   30 mL at 09/24/14 0937  . cloNIDine (CATAPRES) tablet 0.1 mg  0.1 mg Oral BH-qamhs Kerrie Buffalo, NP       Followed by  . [START ON 09/28/2014] cloNIDine (CATAPRES) tablet 0.1 mg  0.1 mg Oral QAC breakfast Kerrie Buffalo, NP      . dicyclomine (BENTYL) tablet 20 mg  20 mg Oral Q6H PRN Kerrie Buffalo, NP      . DULoxetine (CYMBALTA) DR capsule 60 mg  60 mg Oral Daily Kerrie Buffalo, NP   60 mg at 09/25/14 0813  . eszopiclone (LUNESTA) tablet 1 mg  1 mg Oral QHS PRN Kerrie Buffalo, NP      . eszopiclone (  LUNESTA) tablet 2 mg  2 mg Oral QHS Kerrie Buffalo, NP   2 mg at 09/24/14 2212  . gabapentin (NEURONTIN) capsule 300 mg  300 mg Oral TID Kerrie Buffalo, NP   300 mg at 09/25/14 1151  . hydrOXYzine (ATARAX/VISTARIL) tablet 50 mg  50 mg Oral QHS PRN Niel Hummer, NP      . lidocaine (LIDODERM) 5 % 1 patch  1 patch Transdermal Q24H Kerrie Buffalo, NP   1 patch at 09/24/14 1200  . magnesium hydroxide (MILK OF MAGNESIA) suspension 30 mL  30 mL Oral Daily PRN Kerrie Buffalo, NP      . multivitamin with minerals tablet 1 tablet  1 tablet Oral Daily Kerrie Buffalo, NP   1 tablet at 09/25/14 0813  . naproxen (NAPROSYN) tablet 500 mg  500 mg Oral BID PRN Niel Hummer, NP      . neomycin-bacitracin-polymyxin (NEOSPORIN) ointment   Topical PRN Niel Hummer, NP   1 application at 21/19/41 1352  . nicotine (NICODERM CQ - dosed in mg/24 hours) patch 21 mg  21 mg Transdermal Q0600 Nicholaus Bloom, MD   21 mg at 09/25/14 0620  . thiamine (VITAMIN B-1) tablet 100 mg  100 mg Oral Daily Kerrie Buffalo, NP   100 mg at 09/25/14  0813  . tiZANidine (ZANAFLEX) tablet 2 mg  2 mg Oral TID PRN Kerrie Buffalo, NP        Lab Results:  Results for orders placed or performed during the hospital encounter of 09/21/14 (from the past 48 hour(s))  CBC with Differential     Status: Abnormal   Collection Time: 09/23/14 11:14 PM  Result Value Ref Range   WBC 12.0 (H) 4.0 - 10.5 K/uL   RBC 4.03 3.87 - 5.11 MIL/uL   Hemoglobin 12.0 12.0 - 15.0 g/dL   HCT 35.8 (L) 36.0 - 46.0 %   MCV 88.8 78.0 - 100.0 fL   MCH 29.8 26.0 - 34.0 pg   MCHC 33.5 30.0 - 36.0 g/dL   RDW 13.4 11.5 - 15.5 %   Platelets 322 150 - 400 K/uL   Neutrophils Relative % 72 43 - 77 %   Neutro Abs 8.6 (H) 1.7 - 7.7 K/uL   Lymphocytes Relative 23 12 - 46 %   Lymphs Abs 2.8 0.7 - 4.0 K/uL   Monocytes Relative 4 3 - 12 %   Monocytes Absolute 0.5 0.1 - 1.0 K/uL   Eosinophils Relative 1 0 - 5 %   Eosinophils Absolute 0.1 0.0 - 0.7 K/uL   Basophils Relative 0 0 - 1 %   Basophils Absolute 0.0 0.0 - 0.1 K/uL  I-Stat Beta hCG blood, ED ([facility], [facility], [facility] only)     Status: None   Collection Time: 09/23/14 11:19 PM  Result Value Ref Range   I-stat hCG, quantitative <5.0 <5 mIU/mL   Comment 3            Comment:   GEST. AGE      CONC.  (mIU/mL)   <=1 WEEK        5 - 50     2 WEEKS       50 - 500     3 WEEKS       100 - 10,000     4 WEEKS     1,000 - 30,000        FEMALE AND NON-PREGNANT FEMALE:     LESS THAN 5 mIU/mL   Basic metabolic  panel     Status: Abnormal   Collection Time: 09/24/14  6:38 AM  Result Value Ref Range   Sodium 138 135 - 145 mmol/L   Potassium 3.6 3.5 - 5.1 mmol/L   Chloride 106 101 - 111 mmol/L   CO2 24 22 - 32 mmol/L   Glucose, Bld 124 (H) 65 - 99 mg/dL   BUN 17 6 - 20 mg/dL   Creatinine, Ser 0.67 0.44 - 1.00 mg/dL   Calcium 9.2 8.9 - 10.3 mg/dL   GFR calc non Af Amer >60 >60 mL/min   GFR calc Af Amer >60 >60 mL/min    Comment: (NOTE) The eGFR has been calculated using the CKD EPI equation. This calculation has not been validated in all  clinical situations. eGFR's persistently <60 mL/min signify possible Chronic Kidney Disease.    Anion gap 8 5 - 15    Comment: Performed at [facility]    Physical Findings: AIMS: Facial and Oral Movements Muscles of Facial Expression: None, normal Lips and Perioral Area: None, normal Jaw: None, normal Tongue: None, normal,Extremity Movements Upper (arms, wrists, hands, fingers): None, normal Lower (legs, knees, ankles, toes): None, normal, Trunk Movements Neck, shoulders, hips: None, normal, Overall Severity Severity of abnormal movements (highest score from questions above): None, normal Incapacitation due to abnormal movements: None, normal Patient's awareness of abnormal movements (rate only patient's report): No Awareness, Dental Status Current problems with teeth and/or dentures?: No Does patient usually wear dentures?: No  CIWA:  CIWA-Ar Total: 5 COWS:  COWS Total Score: 1  Treatment Plan Summary: Patient will benefit from inpatient treatment and stabilization.  Estimated length of stay is 5-7 days.  Reviewed past medical records,treatment plan.  (DC Elavil- 2/2 lack of efficacy as well as hx of suicide attempts - TCA 's may not be the right choice in this type of patient population.) Continue Clonidine protocol for opiate detox.  Continue Zanaflex 2 mg TID PRN muscle spasticity Continue Lidoderm patch Continue Cymbalta 60 mg po daily for affetcive sx. Ondansetron 4 mg PRN nausea Continue Lunesta 2 mg po qhs for sleep. Increase Vistaril 50 mg hs prn insomnia.  Hypokalemia - Kdur 10 meq x 4 doses - get BMP repeat WNL as of 09/24/2014.  Patient wanting pain management - will provide alternative pain management options at this time . Pt reports she would talk to her PCP and get back to [place] whether he would continue her on opioid pain medications on DC. Will continue to monitor vitals,medication compliance and treatment side effects while patient is here.   Will monitor for medical issues as well as call consult as needed.  Reviewed labs ,will order as needed.  CSW will start working on disposition.  Patient to participate in therapeutic milieu Continue 1:1 for self injurious behaviors until discontinued  Medical Decision Making:  Established Problem, Stable/Improving (1), Review of Psycho-Social Stressors (1), Discuss test with performing physician (1), Review and summation of old records (2) and Review of Medication Regimen & Side Effects (2)  Eoghan Belcher, NP-C 09/25/2014, 2:18 PM

## 2014-09-25 NOTE — Progress Notes (Signed)
Tonight in wrap up group [name] [name] said that her day was a "10" she was able to play spades and visit with her 2 of 3 children today.

## 2014-09-26 MED ORDER — GABAPENTIN 400 MG PO CAPS
400.0000 mg | ORAL_CAPSULE | Freq: Three times a day (TID) | ORAL | Status: DC
  Administered 2014-09-26 – 2014-09-27 (×4): 400 mg via ORAL
  Filled 2014-09-26 (×9): qty 1

## 2014-09-26 MED ORDER — CLONIDINE HCL 0.1 MG PO TABS: 0.1000 mg | ORAL_TABLET | Freq: Two times a day (BID) | ORAL | Status: DC | PRN

## 2014-09-26 MED ORDER — CLONIDINE HCL 0.1 MG PO TABS
0.1000 mg | ORAL_TABLET | Freq: Three times a day (TID) | ORAL | Status: DC
  Administered 2014-09-26 – 2014-09-27 (×3): 0.1 mg via ORAL
  Filled 2014-09-26 (×6): qty 1

## 2014-09-26 NOTE — Progress Notes (Signed)
Patient ID: [name] [name], female   DOB: [birth year], 34 y.o.   MRN: [id number] Dr. Clarnce Flock patient and discussed her care in treatment planning. He discontinued the 1:1, no concerned for her safety, she is able to contract for safety and denies any thoughts to hurt self. She is happy for this 1:1 to be over. She is upbeat and in group supportive of her peers. She was able to set a goal for herself that was reasonable and measurable which is to get into therapy with her husband to improve her marriage.

## 2014-09-26 NOTE — Progress Notes (Signed)
Patient ID: [name] [name], female   DOB: 04/11/1980, 34 y.o.   MRN: [id number] D-Self inventory completed and scored herself a 0 on feelings of depression, anxiety and hopelessness with 0 being the worst. Her affect and interactions dont bear this out. She is animated, talkative, and noted positive interactions.gaol for today is to work on her discharge plan and to get off the 1:1 which she did A-Support offered monitored for safety and medications as ordered. R-No complaints at this time. Active on unit.

## 2014-09-26 NOTE — Progress Notes (Signed)
Pt lying in bed awake.  Sitter reports she just got up a few minutes ago to go to the bathroom.  Pt has no complaints at this time.  Pt slept well through the night.  Continue 1:1 for safety.  Sitter at bedside.  Pt remains safe.

## 2014-09-26 NOTE — Progress Notes (Signed)
Patient ID: [name] [name], female   DOB: 28-May-1980, 34 y.o.   MRN: [id number] Requested a prn Clonidine if she needs it to prevent sx of withdrawal. Spoke to Mickel Baas NP and she gave an order for prn Clonidine. Thus far she has not been experiencing any withdrawal sx.

## 2014-09-26 NOTE — [facility] Group Notes (Signed)
[facility] Group Therapy 09/26/2014 1:15 PM  Type of Therapy: Group Therapy- Feelings about Diagnosis  Participation Level: Active   Participation Quality:  Appropriate  Affect:  Appropriate  Cognitive: Alert and Oriented   Insight:  Developing   Engagement in Therapy: Developing/Improving and Engaged   Modes of Intervention: Clarification, Confrontation, Discussion, Education, Exploration, Limit-setting, Orientation, Problem-solving, Rapport Building, Art therapist, Socialization and Support  Description of Group:   This group will allow patients to explore their thoughts and feelings about diagnoses they have received. Patients will be guided to explore their level of understanding and acceptance of these diagnoses. Facilitator will encourage patients to process their thoughts and feelings about the reactions of others to their diagnosis, and will guide patients in identifying ways to discuss their diagnosis with significant others in their lives. This group will be process-oriented, with patients participating in exploration of their own experiences as well as giving and receiving support and challenge from other group members.  Summary of Progress/Problems:  Pt participated appropriately, identifying that her symptoms cause her to be limited in functioning, especially in her responsibilities as a mother. She expresses that she often feels that her diagnosis gave her the ability to be treated appropriately. She interacted well with peers and provided support to other members.  Therapeutic Modalities:   Cognitive Behavioral Therapy Solution Focused Therapy Motivational Interviewing Relapse Prevention Therapy  Peri Maris, LCSWA 09/26/2014 4:22 PM

## 2014-09-26 NOTE — Progress Notes (Signed)
Patient ID: [name] [name], female   DOB: 10/27/1980, 34 y.o.   MRN: [id number] D-Continues on a 1:1 until sees the doctor this am. She states she doesn't need to continue it and never needed to be on it. She never intended to hurt self and wasn't trying to kill self.She is bright and verbal this am.  A-Support offered. Monitored for safety and medications as ordered.  R-Waiting to speak with Dr re ending her 1:1. No complaints at this time.

## 2014-09-26 NOTE — Progress Notes (Signed)
Recreation Therapy Notes  Animal-Assisted Activity (AAA) Program Checklist/Progress Notes Patient Eligibility Criteria Checklist & Daily Group note for Rec Tx Intervention  Date: 09.06.2016 Time: 2:45pm Location: [address]    AAA/T Program Assumption of Risk Form signed by Patient/ or Parent Legal Guardian yes  Patient is free of allergies or sever asthma yes  Patient reports no fear of animals yes  Patient reports no history of cruelty to animals yes  Patient understands his/her participation is voluntary yes  Patient washes hands before animal contact yes  Patient washes hands after animal contact yes  Behavioral Response: Appropriate   Education: Hand Washing, Appropriate Animal Interaction   Education Outcome: Acknowledges education.   Clinical Observations/Feedback: Patient engaged appropriately with therapy dog, handler and peers during session.   Laureen Ochs [name] [name], LRT/CTRS  [name] [name] 09/26/2014 3:14 PM

## 2014-09-26 NOTE — Progress Notes (Signed)
Pt has been resting in bed with eyes closed.  At this time, no distress observed.  Respirations even and unlabored.  Continue 1:1 for safety.  Sitter at bedside.  Pt safe at this time.

## 2014-09-26 NOTE — Progress Notes (Signed)
Patient ID: [name] [name], female   DOB: 1980-10-17, 34 y.o.   MRN: [id number] [facility] MD Progress Note  09/26/2014 5:42 PM [name] [name]  MRN:  [id number] Subjective:    Patient reports she is feeling better. States her overdose was impulsive, and related at the time to marital tension. States that since then she has spoken with him on several occasions and that he has visited her on unit, and that they have worked their differences out and she is now feeling better and more optimistic. She denies any ongoing suicidal ideations . She has a history of opiate dependence, and states she does feel she has some ongoing opiate WDL, such as abdominal discomfort, sweating, nausea, aches. She states clonidine has been helping and is well tolerated, but she notices increased WDL symptoms as clonidine doses are tapered as per protocol.  Objective:  Patient is seen and case discussed with Nursing Staff and Treatment Team. Patient calm on unit, behavior in good control. Non self injurious ideations. She is on one to one observation after swallowing a piercing ( mouth/ lip?) , which she states was purely accidental.  Of note, CT scan has confirmed foreign object in intestines, reportedly transiting normally. No melenas or bleeding , no changes in appetite, and report is that object is curved/rounded , therefore less likely to cause lacerations or trauma. She is currently going to groups . She presents calm, in no acute distress, but as noted, does report some ongoing opiate WDL symptoms . She is future oriented, looking forward to reuniting with her children after discharge, and planning to start Suboxone management in the near future .   Principal Problem: MDD (major depressive disorder), recurrent severe, without psychosis Diagnosis:   Patient Active Problem List   Diagnosis Date Noted  . MDD (major depressive disorder), recurrent severe, without psychosis [F33.2] 09/22/2014  . Opioid use disorder, severe,  dependence [F11.20] 09/22/2014  . Hypokalemia [E87.6] 09/22/2014  . Tylenol overdose [T39.1X4A] 09/19/2014  . Suicide attempt [T14.91] 09/19/2014  . GERD (gastroesophageal reflux disease) [K21.9] 09/19/2014  . Drug dependence [F19.20]   . Polysubstance (including opioids) dependence with physiological dependence [F19.20] 07/08/2013  . Suicidal ideation [R45.851] 07/06/2013  . Nausea & vomiting [R11.2] 07/06/2013  . Chronic pain disorder [G89.4] 07/06/2013  . Cocaine abuse [F14.10] 07/06/2013  . Major depression [F32.2] 01/05/2012  . Alcohol dependence [F10.20] 01/04/2012  . Opioid dependence [F11.20] 12/29/2011  . Back pain [M54.9] 12/29/2011  . N&V (nausea and vomiting) [R11.2] 06/21/2011  . Tachycardia [R00.0] 06/21/2011  . Abdominal pain [R10.9] 06/21/2011  . Leukocytosis [D72.829] 06/21/2011   Total Time spent with patient: 25 minutes    Past Medical History:  Past Medical History  Diagnosis Date  . Migraine   . GERD (gastroesophageal reflux disease)     protonix evenings  . Endometriosis     chronic pain  . Anxiety     klonipin for stress disorder  . Degenerative disc disease   . Chronic back pain     Past Surgical History  Procedure Laterality Date  . Appendectomy    . Tonsillectomy    . Tubal ligation    . Laparoscopic assisted vaginal hysterectomy  08/19/2011    Procedure: LAPAROSCOPIC ASSISTED VAGINAL HYSTERECTOMY;  Surgeon: Gus Height, MD;  Location: [facility];  Service: Gynecology;  Laterality: N/A;  . Abdominal hysterectomy     Family History:  Family History  Problem Relation Age of Onset  . Coronary artery disease Father   . Coronary artery  disease Maternal Grandmother   . Breast cancer Mother   . Cancer Mother    Social History:  History  Alcohol Use No     History  Drug Use No    Comment: denies    Social History   Social History  . Marital Status: Married    Spouse Name: N/A  . Number of Children: N/A  . Years of Education: N/A    Social History Main Topics  . Smoking status: Current Every Day Smoker -- 1.00 packs/day for 14 years    Types: Cigarettes  . Smokeless tobacco: None  . Alcohol Use: No  . Drug Use: No     Comment: denies  . Sexual Activity: Yes   Other Topics Concern  . None   Social History Narrative   Additional History:    Sleep:  Improved   Appetite:   Good    Musculoskeletal: Strength & Muscle Tone: within normal limits Gait & Station: normal Patient leans: N/A   Psychiatric Specialty Exam: Physical Exam  Vitals reviewed. Psychiatric: Her mood appears anxious. Her affect is labile. She exhibits a depressed mood.    Review of Systems  Constitutional: Negative.   HENT: Negative.   Eyes: Negative.   Respiratory: Negative.   Cardiovascular: Negative.   Gastrointestinal: Negative.   Genitourinary: Negative.   Musculoskeletal: Positive for back pain and joint pain.  Skin: Negative.   Neurological: Negative.   Endo/Heme/Allergies: Negative.   Psychiatric/Behavioral: Positive for depression. The patient is nervous/anxious.   All other systems reviewed and are negative. denies melenas, denies abdominal distress or pain  Blood pressure 115/76, pulse 86, temperature 98 F (36.7 C), temperature source Oral, resp. rate 18, height 5\' 2"  (1.575 m), weight 186 lb (84.369 kg), last menstrual period 08/10/2011, SpO2 100 %.Body mass index is 34.01 kg/(m^2).   General Appearance: Casual,  Eye Contact::  Good   Speech: Clear and Coherent  Volume: Normal  Mood:  Improving, less depressed   Affect: somewhat labile, but smiles often and appropriately   Thought Process: Coherent  Orientation: Full (Time, Place, and Person)  Thought Content:  No hallucinations, no delusions   Suicidal Thoughts: No- denies any self injurious ideations , denies SI  Homicidal Thoughts: No  Memory: Immediate; Fair Recent; Fair Remote; Fair  Judgement:  Fair   Insight:  Fair    Psychomotor Activity: Normal- no psychomotor agitation or distress   Concentration: Fair  Recall: [employer] of Knowledge:Fair  Language: Fair  Akathisia: No  Handed: Right  AIMS (if indicated):    Assets: Communication Skills Desire for Improvement  Sleep: Number of Hours: 6  Cognition: WNL  ADL's: Intact       Current Medications: Current Facility-Administered Medications  Medication Dose Route Frequency Provider Last Rate Last Dose  . acyclovir ointment (ZOVIRAX) 5 %   Topical Q3H Kerrie Buffalo, NP      . alum & mag hydroxide-simeth (MAALOX/MYLANTA) 200-200-20 MG/5ML suspension 30 mL  30 mL Oral Q4H PRN Kerrie Buffalo, NP   30 mL at 09/24/14 0937  . cloNIDine (CATAPRES) tablet 0.1 mg  0.1 mg Oral TID Jenne Campus, MD   0.1 mg at 09/26/14 1655  . cloNIDine (CATAPRES) tablet 0.1 mg  0.1 mg Oral Q12H PRN Niel Hummer, NP      . dicyclomine (BENTYL) tablet 20 mg  20 mg Oral Q6H PRN Kerrie Buffalo, NP      . DULoxetine (CYMBALTA) DR capsule 60 mg  60 mg  Oral Daily Kerrie Buffalo, NP   60 mg at 09/26/14 0740  . eszopiclone (LUNESTA) tablet 2 mg  2 mg Oral QHS Jenne Campus, MD   2 mg at 09/25/14 2138  . gabapentin (NEURONTIN) capsule 400 mg  400 mg Oral TID Jenne Campus, MD   400 mg at 09/26/14 1655  . hydrOXYzine (ATARAX/VISTARIL) tablet 50 mg  50 mg Oral QHS PRN Niel Hummer, NP   50 mg at 09/25/14 2138  . lidocaine (LIDODERM) 5 % 1 patch  1 patch Transdermal Q24H Kerrie Buffalo, NP   1 patch at 09/26/14 1211  . magnesium hydroxide (MILK OF MAGNESIA) suspension 30 mL  30 mL Oral Daily PRN Kerrie Buffalo, NP      . multivitamin with minerals tablet 1 tablet  1 tablet Oral Daily Kerrie Buffalo, NP   1 tablet at 09/26/14 0740  . naproxen (NAPROSYN) tablet 500 mg  500 mg Oral BID PRN Niel Hummer, NP   500 mg at 09/26/14 0753  . neomycin-bacitracin-polymyxin (NEOSPORIN) ointment   Topical PRN Niel Hummer, NP      . nicotine (NICODERM CQ - dosed in  mg/24 hours) patch 21 mg  21 mg Transdermal Q0600 Nicholaus Bloom, MD   21 mg at 09/26/14 [phone]  . thiamine (VITAMIN B-1) tablet 100 mg  100 mg Oral Daily Kerrie Buffalo, NP   100 mg at 09/26/14 0740  . tiZANidine (ZANAFLEX) tablet 2 mg  2 mg Oral TID PRN Kerrie Buffalo, NP   2 mg at 09/25/14 2138    Lab Results:  No results found for this or any previous visit (from the past 48 hour(s)).  Physical Findings: AIMS: Facial and Oral Movements Muscles of Facial Expression: None, normal Lips and Perioral Area: None, normal Jaw: None, normal Tongue: None, normal,Extremity Movements Upper (arms, wrists, hands, fingers): None, normal Lower (legs, knees, ankles, toes): None, normal, Trunk Movements Neck, shoulders, hips: None, normal, Overall Severity Severity of abnormal movements (highest score from questions above): None, normal Incapacitation due to abnormal movements: None, normal Patient's awareness of abnormal movements (rate only patient's report): No Awareness, Dental Status Current problems with teeth and/or dentures?: No Does patient usually wear dentures?: No  CIWA:  CIWA-Ar Total: 5 COWS:  COWS Total Score: 3   Assessment- patient reporting improved mood and today presenting with reactive affect. No ongoing SI, and behavior on unit in good control- future oriented. Some residual/ ongoing opiate withdrawal symptoms. She is tolerating medications well at present. States clonidine helpful but increased WDL symptoms as dose is lowered. No GI symptoms or distress associated to recent ingestion of foreign object .    Treatment Plan Summary: Continue inpatient treatment and crisis stabilization. Agree with not restarting TCA ( Amitriptyline )  Due to history of impulsive, serious overdoses . Continue  Clonidine  At 0.1 mgrs TID for opiate detox. Will gradually taper  As symptoms abate.  Increase Neurontin to 400 mgrs TID to address pain and anxiety symptoms. Continue Zanaflex 2 mg TID  PRN muscle spasticity Continue Lidoderm patch for pain Continue Cymbalta 60 mg po daily for affetcive sx. Continue Ondansetron 4 mg PRN nausea Continue Lunesta 2 mg po qhs for sleep. Patient to participate in therapeutic milieu As above, discontinue  1:1  As improved and no SI .   Medical Decision Making:  Established Problem, Stable/Improving (1), Review of Psycho-Social Stressors (1), Discuss test with performing physician (1), Review and summation of old records (2) and  Review of Medication Regimen & Side Effects (2)  Neita Garnet,  MD  09/26/2014, 5:42 PM

## 2014-09-26 NOTE — [facility] Group Notes (Signed)
Adult Psychoeducational Group Note  Date:  09/26/2014 Time:  10:28 PM  Group Topic/Focus:  Wrap-Up Group:   The focus of this group is to help patients review their daily goal of treatment and discuss progress on daily workbooks.  Participation Level:  Active  Participation Quality:  Appropriate  Affect:  Appropriate  Cognitive:  Appropriate  Insight: Appropriate  Engagement in Group:  Engaged  Modes of Intervention:  Discussion  Additional Comments:  Patient stated her goal was to get off her 1:1 which she did.  She also expressed she played cards.  Victorino Sparrow A 09/26/2014, 10:28 PM

## 2014-09-26 NOTE — Tx Team (Signed)
Interdisciplinary Treatment Plan Update (Adult) Date: 09/26/2014   Date: 09/26/2014 9:26 AM  Progress in Treatment:  Attending groups: Yes  Participating in groups: Yes  Taking medication as prescribed: Yes  Tolerating medication: Yes  Family/Significant othe contact made: No, Pt declines family contact Patient understands diagnosis: Yes Discussing patient identified problems/goals with staff: Yes  Medical problems stabilized or resolved: Yes  Denies suicidal/homicidal ideation: Yes Patient has not harmed self or Others: Yes   New problem(s) identified: None identified at this time.   Discharge Plan or Barriers: Pt will return home and follow-up with PCP and [place]  Additional comments: n/a   Reason for Continuation of Hospitalization:  Anxiety Depression Medication stabilization Suicidal ideation  Estimated length of stay: 2-3 days  Review of initial/current patient goals per problem list:   1.  Goal(s): Patient will participate in aftercare plan  Met:  Yes  Target date: 3-5 days from date of admission   As evidenced by: Patient will participate within aftercare plan AEB aftercare provider and housing plan at discharge being identified.   09/26/14: Pt will return home and follow-up with outpatient providers  2.  Goal (s): Patient will exhibit decreased depressive symptoms and suicidal ideations.  Met:  Yes  Target date: 3-5 days from date of admission   As evidenced by: Patient will utilize self rating of depression at 3 or below and demonstrate decreased signs of depression or be deemed stable for discharge by MD.  09/26/14: Pt rates depression at 3/10 and denies SI.  3.  Goal(s): Patient will demonstrate decreased signs and symptoms of anxiety.  Met:  Yes  Target date: 3-5 days from date of admission   As evidenced by: Patient will utilize self rating of anxiety at 3 or below and demonstrated decreased signs of anxiety, or be deemed stable  for discharge by MD  09/26/14: Pt rates anxiety at 3/10 and describes her symptoms as manageable.  Attendees:  Patient:    Family:    Physician: Dr. Parke Poisson, MD  09/26/2014 9:26 AM  Nursing: Lars Pinks, RN Case manager  09/26/2014 9:26 AM  Clinical Social Worker Norman Clay, MSW 09/26/2014 9:26 AM  Other: Jake Bathe Liasion 09/26/2014 9:26 AM  Clinical:  Allene Dillon, RN 09/26/2014 9:26 AM  Other: , RN Charge Nurse 09/26/2014 9:26 AM  Other:     Peri Maris, Latanya Presser MSW

## 2014-09-27 LAB — HEPATIC FUNCTION PANEL
ALK PHOS: 82 U/L (ref 38–126)
ALT: 21 U/L (ref 14–54)
AST: 32 U/L (ref 15–41)
Albumin: 4.4 g/dL (ref 3.5–5.0)
BILIRUBIN DIRECT: 0.3 mg/dL (ref 0.1–0.5)
BILIRUBIN TOTAL: 0.6 mg/dL (ref 0.3–1.2)
Indirect Bilirubin: 0.3 mg/dL (ref 0.3–0.9)
Total Protein: 8.5 g/dL — ABNORMAL HIGH (ref 6.5–8.1)

## 2014-09-27 MED ORDER — GABAPENTIN 400 MG PO CAPS: 400.0000 mg | ORAL_CAPSULE | Freq: Three times a day (TID) | ORAL | Status: DC

## 2014-09-27 MED ORDER — CLONIDINE HCL 0.1 MG PO TABS
0.1000 mg | ORAL_TABLET | Freq: Two times a day (BID) | ORAL | Status: DC | PRN
Start: 2014-09-27 — End: 2014-09-27
  Administered 2014-09-27: 0.1 mg via ORAL

## 2014-09-27 MED ORDER — CLONIDINE HCL 0.1 MG PO TABS
0.1000 mg | ORAL_TABLET | Freq: Four times a day (QID) | ORAL | Status: DC
  Filled 2014-09-27 (×4): qty 1

## 2014-09-27 MED ORDER — TIZANIDINE HCL 2 MG PO TABS: 2.0000 mg | ORAL_TABLET | Freq: Three times a day (TID) | ORAL | Status: DC | PRN

## 2014-09-27 MED ORDER — DULOXETINE HCL 60 MG PO CPEP: 60.0000 mg | ORAL_CAPSULE | Freq: Every day | ORAL | Status: DC

## 2014-09-27 MED ORDER — CLONIDINE HCL 0.1 MG PO TABS: ORAL_TABLET | ORAL | Status: DC

## 2014-09-27 NOTE — Plan of Care (Signed)
Problem: Alteration in mood & ability to function due to Goal: LTG-Pt reports reduction in suicidal thoughts (Patient reports reduction in suicidal thoughts and is able to verbalize a safety plan for whenever patient is feeling suicidal)  Outcome: Completed/Met Date Met:  09/27/14 Pr denies suicidal thoughts and reports she is ready to get back to her family, especially her children.

## 2014-09-27 NOTE — Plan of Care (Signed)
Problem: Alteration in mood & ability to function due to Goal: STG-Patient will attend groups Outcome: Adequate for Discharge Pt has been attending the unit groups regularly.

## 2014-09-27 NOTE — Progress Notes (Signed)
  [facility] Adult Case Management Discharge Plan :  Will you be returning to the same living situation after discharge:  Yes,  home with family At discharge, do you have transportation home?: Yes,  husband to provide transportation Do you have the ability to pay for your medications: Yes,  provided with prescriptions  Release of information consent forms completed and in the chart;  Patient's signature needed at discharge.  Patient to Follow up at: Follow-up Information    Follow up with [facility] On 10/02/2014.   Why:  Medication management appointment for psychiatric medications on Monday Sept. 12th at 1pm with Reginold Agent, NP. Please call office if you need to reschedule appointment.    Contact information:   [address],  [place], [place] [address] Phone: [phone]      Follow up with [facility] On 09/29/2014.   Why:  Medication management appointment for pain medications at 11.    Contact information:   [address] [place], [place] [address] Phone: [phone]      Follow up with [facility]. Schedule an appointment as soon as possible for a visit today.   Why:  Call [phone] to make and appointment to follow up on the object that you swallowed while you were here in the hospital   Contact information:   [place] [phone]       Patient denies SI/HI: Yes,  Pt denies    Safety Planning and Suicide Prevention discussed: Yes,  with Pt; declined family contact  Have you used any form of tobacco in the last 30 days? (Cigarettes, Smokeless Tobacco, Cigars, and/or Pipes): Yes  Has patient been referred to the Quitline?: Yes, faxed on 09/27/14  Bo Mcclintock 09/27/2014, 3:52 PM

## 2014-09-27 NOTE — Discharge Summary (Signed)
Physician Discharge Summary Note  Patient:  [name] [name] is an 34 y.o., female MRN:  [id number] DOB:  Apr 30, 1980 Patient phone:  [phone] (home)  Patient address:   [address] [place] [address],  Total Time spent with patient: 30 minutes  Date of Admission:  09/21/2014 Date of Discharge: 09/27/2014   Reason for Admission:  Depression  Principal Problem: MDD (major depressive disorder), recurrent severe, without psychosis Discharge Diagnoses: Patient Active Problem List   Diagnosis Date Noted  . MDD (major depressive disorder), recurrent severe, without psychosis [F33.2] 09/22/2014  . Opioid use disorder, severe, dependence [F11.20] 09/22/2014  . Hypokalemia [E87.6] 09/22/2014  . Tylenol overdose [T39.1X4A] 09/19/2014  . Suicide attempt [T14.91] 09/19/2014  . GERD (gastroesophageal reflux disease) [K21.9] 09/19/2014  . Drug dependence [F19.20]   . Polysubstance (including opioids) dependence with physiological dependence [F19.20] 07/08/2013  . Suicidal ideation [R45.851] 07/06/2013  . Nausea & vomiting [R11.2] 07/06/2013  . Chronic pain disorder [G89.4] 07/06/2013  . Cocaine abuse [F14.10] 07/06/2013  . Major depression [F32.2] 01/05/2012  . Alcohol dependence [F10.20] 01/04/2012  . Opioid dependence [F11.20] 12/29/2011  . Back pain [M54.9] 12/29/2011  . N&V (nausea and vomiting) [R11.2] 06/21/2011  . Tachycardia [R00.0] 06/21/2011  . Abdominal pain [R10.9] 06/21/2011  . Leukocytosis [D72.829] 06/21/2011    Musculoskeletal: Strength & Muscle Tone: within normal limits Gait & Station: normal Patient leans: N/A  Psychiatric Specialty Exam: Physical Exam  ROS  Blood pressure 117/82, pulse 102, temperature 98.2 F (36.8 C), temperature source Oral, resp. rate 16, height 5\' 2"  (1.575 m), weight 84.369 kg (186 lb), last menstrual period 08/10/2011, SpO2 100 %.Body mass index is 34.01 kg/(m^2).   General Appearance: improved grooming   Eye Contact::  Good  Speech: Normal   Volume: Normal  Mood: improved and currently minimizes depression  Affect: Appropriate and Full Range  Thought Process: Linear  Orientation: Full (Time, Place, and Person)  Thought Content: denies hallucinations, no delusions, not internally preoccupied   Suicidal Thoughts: No- denies any SI or any self injurious ideations noted  Homicidal Thoughts: No  Memory: recent and remote grossly intact   Judgement: Fair  Insight: Fair  Psychomotor Activity: Normal- no restlessness or agitation noted  Concentration: Good  Recall: Good  Fund of Knowledge:Good  Language: Good  Akathisia: Negative  Handed: Right  AIMS (if indicated):    Assets: Communication Skills Desire for Improvement Resilience  Sleep: Number of Hours: 4.75  Cognition: WNL  ADL's: Improved         Have you used any form of tobacco in the last 30 days? (Cigarettes, Smokeless Tobacco, Cigars, and/or Pipes): Yes  Has this patient used any form of tobacco in the last 30 days? (Cigarettes, Smokeless Tobacco, Cigars, and/or Pipes) N/A  Past Medical History:  Past Medical History  Diagnosis Date  . Migraine   . GERD (gastroesophageal reflux disease)     protonix evenings  . Endometriosis     chronic pain  . Anxiety     klonipin for stress disorder  . Degenerative disc disease   . Chronic back pain     Past Surgical History  Procedure Laterality Date  . Appendectomy    . Tonsillectomy    . Tubal ligation    . Laparoscopic assisted vaginal hysterectomy  08/19/2011    Procedure: LAPAROSCOPIC ASSISTED VAGINAL HYSTERECTOMY;  Surgeon: Gus Height, MD;  Location: [facility];  Service: Gynecology;  Laterality: N/A;  . Abdominal hysterectomy     Family History:  Family  History  Problem Relation Age of Onset  . Coronary artery disease Father   . Coronary artery disease Maternal Grandmother   . Breast cancer Mother   . Cancer Mother    Social History:   History  Alcohol Use No     History  Drug Use No    Comment: denies    Social History   Social History  . Marital Status: Married    Spouse Name: N/A  . Number of Children: N/A  . Years of Education: N/A   Social History Main Topics  . Smoking status: Current Every Day Smoker -- 1.00 packs/day for 14 years    Types: Cigarettes  . Smokeless tobacco: None  . Alcohol Use: No  . Drug Use: No     Comment: denies  . Sexual Activity: Yes   Other Topics Concern  . None   Social History Narrative   Risk to Self: Is patient at risk for suicide?: Yes Risk to Others:   Prior Inpatient Therapy:   Prior Outpatient Therapy:    Level of Care:  OP  Hospital Course:  [name] [name] was admitted for MDD (major depressive disorder), recurrent severe, without psychosis and crisis management.  She was treated discharged with the medications listed below under Medication List.  Medical problems were identified and treated as needed.  Home medications were restarted as appropriate.  Improvement was monitored by observation and [name] [name] daily report of symptom reduction.  Emotional and mental status was monitored by daily self-inventory reports completed by [name] [name] and clinical staff.         [name] [name] was evaluated by the treatment team for stability and plans for continued recovery upon discharge.  [name] [name] motivation was an integral factor for scheduling further treatment.  Employment, transportation, bed availability, health status, family support, and any pending legal issues were also considered during her hospital stay.  She was offered further treatment options upon discharge including but not limited to Residential, Intensive Outpatient, and Outpatient treatment.  [name] [name] will follow up with the services as listed below under Follow Up Information.     Upon completion of this admission the patient was both mentally and medically stable for discharge denying  suicidal/homicidal ideation, auditory/visual/tactile hallucinations, delusional thoughts and paranoia.      Consults:  psychiatry  Significant Diagnostic Studies:  labs: per ED  Discharge Vitals:   Blood pressure 117/82, pulse 102, temperature 98.2 F (36.8 C), temperature source Oral, resp. rate 16, height 5\' 2"  (1.575 m), weight 84.369 kg (186 lb), last menstrual period 08/10/2011, SpO2 100 %. Body mass index is 34.01 kg/(m^2). Lab Results:   Results for orders placed or performed during the hospital encounter of 09/21/14 (from the past 72 hour(s))  Hepatic function panel     Status: Abnormal   Collection Time: 09/27/14  6:25 AM  Result Value Ref Range   Total Protein 8.5 (H) 6.5 - 8.1 g/dL   Albumin 4.4 3.5 - 5.0 g/dL   AST 32 15 - 41 U/L   ALT 21 14 - 54 U/L   Alkaline Phosphatase 82 38 - 126 U/L   Total Bilirubin 0.6 0.3 - 1.2 mg/dL   Bilirubin, Direct 0.3 0.1 - 0.5 mg/dL   Indirect Bilirubin 0.3 0.3 - 0.9 mg/dL    Comment: Performed at [facility]    Physical Findings: AIMS: Facial and Oral Movements Muscles of Facial Expression: None, normal Lips and Perioral Area: None, normal Jaw: None, normal  Tongue: None, normal,Extremity Movements Upper (arms, wrists, hands, fingers): None, normal Lower (legs, knees, ankles, toes): None, normal, Trunk Movements Neck, shoulders, hips: None, normal, Overall Severity Severity of abnormal movements (highest score from questions above): None, normal Incapacitation due to abnormal movements: None, normal Patient's awareness of abnormal movements (rate only patient's report): No Awareness, Dental Status Current problems with teeth and/or dentures?: No Does patient usually wear dentures?: No  CIWA:  CIWA-Ar Total: 5 COWS:  COWS Total Score: 3   See Psychiatric Specialty Exam and Suicide Risk Assessment completed by Attending Physician prior to discharge.  Discharge destination:  Home  Is patient on multiple  antipsychotic therapies at discharge:  No   Has Patient had three or more failed trials of antipsychotic monotherapy by history:  No    Recommended Plan for Multiple Antipsychotic Therapies: NA      Discharge Instructions    Ambulatory referral to Gastroenterology    Complete by:  As directed   Patient swallowed an object while in the hospital.  Will need follow up to assure object has passed or further evaluation needed.  What is the reason for referral?:  Other Comment - Patient swallowed an object while in the hospital            Medication List    STOP taking these medications        ABILIFY 10 MG tablet  Generic drug:  ARIPiprazole     amitriptyline 100 MG tablet  Commonly known as:  ELAVIL     FLUoxetine 40 MG capsule  Commonly known as:  PROZAC     OXYCONTIN 60 MG T12a  Generic drug:  OxyCODONE HCl ER     oxymorphone 10 MG tablet  Commonly known as:  OPANA     pantoprazole 40 MG tablet  Commonly known as:  PROTONIX     polyethylene glycol packet  Commonly known as:  MIRALAX / GLYCOLAX     Potassium Chloride ER 20 MEQ Tbcr     VYVANSE 70 MG capsule  Generic drug:  lisdexamfetamine      TAKE these medications      Indication   cloNIDine 0.1 MG tablet  Commonly known as:  CATAPRES  May take 1 tab (0.1 mg) up to four times a day as needed.   Indication:  Codeine/Morphine-Like Drug Withdrawal Syndrome     DULoxetine 60 MG capsule  Commonly known as:  CYMBALTA  Take 1 capsule (60 mg total) by mouth daily.   Indication:  Major Depressive Disorder, Musculoskeletal Pain     gabapentin 400 MG capsule  Commonly known as:  NEURONTIN  Take 1 capsule (400 mg total) by mouth 3 (three) times daily.   Indication:  Nerve Pain     tiZANidine 2 MG tablet  Commonly known as:  ZANAFLEX  Take 1 tablet (2 mg total) by mouth 3 (three) times daily as needed for muscle spasms.   Indication:  Muscle Spasticity       Follow-up Information    Follow up with  [facility] On 10/02/2014.   Why:  Medication management appointment for psychiatric medications on Monday Sept. 12th at 1pm with Reginold Agent, NP. Please call office if you need to reschedule appointment.    Contact information:   [address],  [place], [place] [address] Phone: [phone]      Follow up with [facility] On 09/29/2014.   Why:  Medication management appointment for pain medications at 11.  Contact information:   [address] [place], [place] [address] Phone: [phone]      Follow up with [facility]. Schedule an appointment as soon as possible for a visit today.   Why:  Call [phone] to make and appointment to follow up on the object that you swallowed while you were here in the hospital   Contact information:   [place] [phone]       Follow-up recommendations:  Activity:  as tol Diet:  as tol  Comments:  1.  Take all your medications as prescribed.              2.  Report any adverse side effects to outpatient provider.                       3.  Patient instructed to not use alcohol or illegal drugs while on prescription medicines.            4.  In the event of worsening symptoms, instructed patient to call 911, the crisis hotline or go to nearest emergency room for evaluation of symptoms.  Total Discharge Time: 40 min  Signed: Freda Munro May Agustin AGNP-BC 09/27/2014, 2:51 PM   Patient seen, Suicide Assessment Completed.  Disposition Plan Reviewed

## 2014-09-27 NOTE — Progress Notes (Signed)
Pt states she is doing better and is feeling better.  She says the med changes are helping.  She is concerned that she has not passed the object she swallowed by mistake on Saturday.  Pt is having a small amount of pain in her side, but nothing severe.  She has not passed any blood.  She says she is still having some diarrhea from the opiate withdrawals.  Pt was assured that writer would pass this information to the next shift.  Pt denies SI/HI/AVH.  She is looking forward to another good night sleep as she had last night.  She has been in the dayroom most of the evening talking with her peers.  She attended evening group.  She plans to return home at discharge.  Discharge plans are still in process.  Pt makes her needs known to staff.  Support and encouragement offered.  Safety maintained with q15 minute checks.

## 2014-09-27 NOTE — [facility] Group Notes (Signed)
   [facility] LCSW Aftercare Discharge Planning Group Note  09/27/2014  8:45 AM   Participation Quality: Alert, Appropriate and Oriented  Mood/Affect: Appropriate  Depression Rating: 0  Anxiety Rating: 5  Thoughts of Suicide: Pt denies SI/HI  Will you contract for safety? Yes  Current AVH: Pt denies  Plan for Discharge/Comments: Pt attended discharge planning group and actively participated in group. CSW provided pt with today's workbook. Patient reports that she is looking forward to returning home at discharge to follow up with outpatient services.  Transportation Means: Pt reports access to transportation  Supports: No supports mentioned at this time  Tilden Fossa, MSW, [place] Social Worker [employer] [phone]

## 2014-09-27 NOTE — [facility] Suicide Risk Assessment (Signed)
[facility] Discharge Suicide Risk Assessment   Demographic Factors:  34 year old female   Total Time spent with patient: 30 minutes  Musculoskeletal: Strength & Muscle Tone: within normal limits Gait & Station: normal Patient leans: N/A  Psychiatric Specialty Exam: Physical Exam  ROS  Blood pressure 125/60, pulse 102, temperature 98.2 F (36.8 C), temperature source Oral, resp. rate 16, height 5\' 2"  (1.575 m), weight 186 lb (84.369 kg), last menstrual period 08/10/2011, SpO2 100 %.Body mass index is 34.01 kg/(m^2).  General Appearance: improved grooming   Eye Contact::  Good  Speech:  Normal Rate409  Volume:  Normal  Mood:  improved and currently minimizes depression  Affect:  Appropriate and Full Range  Thought Process:  Linear  Orientation:  Full (Time, Place, and Person)  Thought Content:  denies hallucinations, no delusions, not internally preoccupied   Suicidal Thoughts:  No- denies any SI or any self injurious ideations   Homicidal Thoughts:  No  Memory:  recent and remote grossly intact   Judgement:  Fair  Insight:  Fair  Psychomotor Activity:  Normal- no restlessness or agitation   Concentration:  Good  Recall:  Good  Fund of Knowledge:Good  Language: Good  Akathisia:  Negative  Handed:  Right  AIMS (if indicated):     Assets:  Communication Skills Desire for Improvement Resilience  Sleep:  Number of Hours: 4.75  Cognition: WNL  ADL's:   Improved    Have you used any form of tobacco in the last 30 days? (Cigarettes, Smokeless Tobacco, Cigars, and/or Pipes): Yes  Has this patient used any form of tobacco in the last 30 days? (Cigarettes, Smokeless Tobacco, Cigars, and/or Pipes) Yes, A prescription for an FDA-approved tobacco cessation medication was offered at discharge and the patient refused  Mental Status Per Nursing Assessment::   On Admission:     Current Mental Status by Physician: At this time patient states she is feeling well, she denies depression, she  presents with a full range of affect,  No thought disorder, no SI , no HI, no psychotic symptoms. She is future oriented and is looking forward to reunite with her family/child, and plans to enroll in a Suboxone clinic later this week .   Loss Factors:  chronic pain   Historical Factors:  history of depression, history of prior suicide attempts and psychiatric admissions- history of opiate dependence   Risk Reduction Factors:   Sense of responsibility to family, Living with another person, especially a relative and Positive coping skills or problem solving skills  Continued Clinical Symptoms:  At this time , as noted above, she reports much improvement - mood and affect improved, no current depressive symptoms, no SI, no psychosis. She states her symptoms of opiate WDL have abated, but still complains of some aches/ " sweating" at night time as residual WDL symptoms. At this time she appears calm, in no acute distress, and her vitals are stable.  Cognitive Features That Contribute To Risk:  No gross cognitive deficits noted upon discharge. Is alert , attentive, and oriented x 3   Suicide Risk:  Mild:  Suicidal ideation of limited frequency, intensity, duration, and specificity.  There are no identifiable plans, no associated intent, mild dysphoria and related symptoms, good self-control (both objective and subjective assessment), few other risk factors, and identifiable protective factors, including available and accessible social support.  Principal Problem: MDD (major depressive disorder), recurrent severe, without psychosis Discharge Diagnoses:  Patient Active Problem List   Diagnosis Date  Noted  . MDD (major depressive disorder), recurrent severe, without psychosis [F33.2] 09/22/2014  . Opioid use disorder, severe, dependence [F11.20] 09/22/2014  . Hypokalemia [E87.6] 09/22/2014  . Tylenol overdose [T39.1X4A] 09/19/2014  . Suicide attempt [T14.91] 09/19/2014  . GERD  (gastroesophageal reflux disease) [K21.9] 09/19/2014  . Drug dependence [F19.20]   . Polysubstance (including opioids) dependence with physiological dependence [F19.20] 07/08/2013  . Suicidal ideation [R45.851] 07/06/2013  . Nausea & vomiting [R11.2] 07/06/2013  . Chronic pain disorder [G89.4] 07/06/2013  . Cocaine abuse [F14.10] 07/06/2013  . Major depression [F32.2] 01/05/2012  . Alcohol dependence [F10.20] 01/04/2012  . Opioid dependence [F11.20] 12/29/2011  . Back pain [M54.9] 12/29/2011  . N&V (nausea and vomiting) [R11.2] 06/21/2011  . Tachycardia [R00.0] 06/21/2011  . Abdominal pain [R10.9] 06/21/2011  . Leukocytosis [D72.829] 06/21/2011    Follow-up Information    Follow up with [facility] On 10/02/2014.   Why:  Medication management appointment for psychiatric medications on Monday Sept. 12th at 1pm with Reginold Agent, NP. Please call office if you need to reschedule appointment.    Contact information:   [address],  [place], [place] [address] Phone: [phone]      Follow up with [facility] On 09/29/2014.   Why:  Medication management appointment for pain medications at 11.    Contact information:   [address] [place], [place] [address] Phone: [phone]      Follow up with [facility]. Schedule an appointment as soon as possible for a visit today.   Why:  Call [phone] to make and appointment to follow up on the object that you swallowed while you were here in the hospital   Contact information:   [place] [phone]       Plan Of Care/Follow-up recommendations:  Activity:  as tolerated  Diet:  regular Tests:  NA Other:  see below  Is patient on multiple antipsychotic therapies at discharge:  No   Has Patient had three or more failed trials of antipsychotic monotherapy by history:  No  Recommended Plan for Multiple Antipsychotic Therapies: NA  Patent wanting  discharge and at this time no grounds for ongoing involuntary commitment . She is leaving in good spirits . Plans to return home. As noted, plans to start outpatient Suboxone management for pain and opiate dependence management later this week. Due to recent foreign object ingestion - referred to GI. As noted in prior notes, report is that shape of object and normal transit through GI system, as documented by imaging, is reassuring  And that object is expected to be passed without issue . She denies melenas or abdominal pain.   COBOS, FERNANDO 09/27/2014, 11:21 AM

## 2014-09-27 NOTE — Progress Notes (Signed)
Patient ID: [name] [name], female   DOB: 10-02-1980, 34 y.o.   MRN: [id number] D-Rates depression and hopelessness and anxiety a 0 today and is asking for discharge.States he husband is leaving out of state tonight and she needs to be home for the kids who are 9 and 6, She denies any problems so she is ready to go now. She said the original plan was for discharge in the am tomorrow so not a significant difference in today versus tomorrow am. Lists discharge home today as her goal. A-Support offered. Monitored for safety medications as ordered. R-No complaints at this time. Has seen the doctor this am, waiting on discharge orders.

## 2014-09-27 NOTE — Plan of Care (Signed)
Problem: Alteration in mood; excessive anxiety as evidenced by: Goal: LTG-Patient's behavior demonstrates decreased anxiety (Patient's behavior demonstrates anxiety and he/she is utilizing learned coping skills to deal with anxiety-producing situations)  Outcome: Progressing Pt reports that she is able to deal with her anxiety in a more positive way without being so focused on medications.

## 2014-09-27 NOTE — Progress Notes (Signed)
Patient ID: [name] [name], female   DOB: August 29, 1980, 34 y.o.   MRN: [id number] Discharge Note-Excited all day to be going home. She spoke to Dr Parke Poisson early this am and made the arrangements. She had sent all of her property home ahead of discharge and has nothing in the lockers in the search room. She denies any thoughts to hurt self.or others. She was never and is not now psychotic. Reviewed with her her discharge instructions and take home Rx's and she verbalized her understanding of all of the instructions. Two letters prepared for her at her request by the social worker. Given all this information and escorted to lobby for discharge where her husband and two boys were waiting for her.

## 2014-12-04 ENCOUNTER — Emergency Department (HOSPITAL_COMMUNITY)
Admission: EM | Admit: 2014-12-04 | Discharge: 2014-12-04 | Disposition: A | Discharge status: Home / Self Care | Payer: Medicaid Other | Attending: Emergency Medicine | Admitting: Emergency Medicine

## 2014-12-04 ENCOUNTER — Encounter (HOSPITAL_COMMUNITY): Payer: Self-pay | Admitting: Emergency Medicine

## 2014-12-04 DIAGNOSIS — F17210 Nicotine dependence, cigarettes, uncomplicated: Secondary | ICD-10-CM | POA: Diagnosis not present

## 2014-12-04 DIAGNOSIS — G43809 Other migraine, not intractable, without status migrainosus: Secondary | ICD-10-CM | POA: Diagnosis not present

## 2014-12-04 DIAGNOSIS — K219 Gastro-esophageal reflux disease without esophagitis: Secondary | ICD-10-CM | POA: Insufficient documentation

## 2014-12-04 DIAGNOSIS — Z79899 Other long term (current) drug therapy: Secondary | ICD-10-CM | POA: Insufficient documentation

## 2014-12-04 DIAGNOSIS — Z880 Allergy status to penicillin: Secondary | ICD-10-CM | POA: Insufficient documentation

## 2014-12-04 DIAGNOSIS — G8929 Other chronic pain: Secondary | ICD-10-CM | POA: Diagnosis not present

## 2014-12-04 DIAGNOSIS — F419 Anxiety disorder, unspecified: Secondary | ICD-10-CM | POA: Diagnosis not present

## 2014-12-04 DIAGNOSIS — Z8742 Personal history of other diseases of the female genital tract: Secondary | ICD-10-CM | POA: Diagnosis not present

## 2014-12-04 DIAGNOSIS — Z8739 Personal history of other diseases of the musculoskeletal system and connective tissue: Secondary | ICD-10-CM | POA: Diagnosis not present

## 2014-12-04 DIAGNOSIS — G43909 Migraine, unspecified, not intractable, without status migrainosus: Secondary | ICD-10-CM | POA: Diagnosis present

## 2014-12-04 MED ORDER — METOCLOPRAMIDE HCL 5 MG/ML IJ SOLN
10.0000 mg | Freq: Once | INTRAMUSCULAR | Status: AC
  Administered 2014-12-04: 10 mg via INTRAVENOUS
  Filled 2014-12-04: qty 2

## 2014-12-04 MED ORDER — DIPHENHYDRAMINE HCL 50 MG/ML IJ SOLN
25.0000 mg | Freq: Once | INTRAMUSCULAR | Status: AC
  Administered 2014-12-04: 25 mg via INTRAVENOUS
  Filled 2014-12-04: qty 1

## 2014-12-04 MED ORDER — SODIUM CHLORIDE 0.9 % IV BOLUS (SEPSIS)
1000.0000 mL | Freq: Once | INTRAVENOUS | Status: AC
  Administered 2014-12-04: 1000 mL via INTRAVENOUS

## 2014-12-04 NOTE — ED Notes (Signed)
Pt c/o migraine that started 2 days ago. Pt has light sensitivity with the headache.  Pt denise n/v.

## 2014-12-04 NOTE — Discharge Instructions (Signed)

## 2014-12-04 NOTE — ED Provider Notes (Signed)
CSN: [id number]     Arrival date & time 12/04/14  1046 History   First MD Initiated Contact with Patient 12/04/14 1104     Chief Complaint  Patient presents with  . Migraine     (Consider location/radiation/quality/duration/timing/severity/associated sxs/prior Treatment) Patient is a 34 y.o. female presenting with migraines. The history is provided by the patient.  Migraine This is a recurrent problem. The current episode started in the past 7 days. The problem occurs intermittently. The problem has been unchanged. Associated symptoms include headaches. Pertinent negatives include no numbness or weakness. The symptoms are aggravated by exertion. She has tried acetaminophen and NSAIDs for the symptoms. The treatment provided no relief.   Patient is a 34 year old female with history of migraines, anxiety, and chronic back pain who presents with migraine headache for the past 2 days. Patient reports that her current migraine is similar in nature to her previous migraines. Pain is described as sharp and shooting. Patient states "It feels like someone is stabbing behind my eyes." Pain is usually worse on the left side. Patient endorses photophobia and phonophobia. She has tried taking tylenol and Excedrin migraine without significant relief. No vision changes. No weakness or numbness. No nausea or vomiting. No fevers or chills. No recent illnesses or sick contacts.   Past Medical History  Diagnosis Date  . Migraine   . GERD (gastroesophageal reflux disease)     protonix evenings  . Endometriosis     chronic pain  . Anxiety     klonipin for stress disorder  . Degenerative disc disease   . Chronic back pain    Past Surgical History  Procedure Laterality Date  . Appendectomy    . Tonsillectomy    . Tubal ligation    . Laparoscopic assisted vaginal hysterectomy  08/19/2011    Procedure: LAPAROSCOPIC ASSISTED VAGINAL HYSTERECTOMY;  Surgeon: Gus Height, MD;  Location: [facility];  Service:  Gynecology;  Laterality: N/A;  . Abdominal hysterectomy     Family History  Problem Relation Age of Onset  . Coronary artery disease Father   . Coronary artery disease Maternal Grandmother   . Breast cancer Mother   . Cancer Mother    Social History  Substance Use Topics  . Smoking status: Current Every Day Smoker -- 1.00 packs/day for 14 years    Types: Cigarettes  . Smokeless tobacco: None  . Alcohol Use: No   OB History    Gravida Para Term Preterm AB TAB SAB Ectopic Multiple Living   6 2             Review of Systems  Constitutional: Negative.   Eyes: Negative.   Respiratory: Negative.   Cardiovascular: Negative.   Gastrointestinal: Negative.   Endocrine: Negative.   Genitourinary: Negative.   Musculoskeletal: Negative.   Skin: Negative.   Allergic/Immunologic: Negative.   Neurological: Positive for headaches. Negative for weakness and numbness.  Hematological: Negative.   Psychiatric/Behavioral: Negative.       Allergies  Epidural tray 17gx3-1-2"; Ibuprofen; Zofran; and Penicillins  Home Medications   Prior to Admission medications   Medication Sig Start Date End Date Taking? Authorizing Provider  acetaminophen (TYLENOL) 500 MG tablet Take 1,000 mg by mouth every 6 (six) hours as needed for mild pain, moderate pain, fever or headache.   Yes Historical Provider, MD  aspirin-acetaminophen-caffeine (EXCEDRIN MIGRAINE) [phone] MG tablet Take 2 tablets by mouth every 6 (six) hours as needed for headache or migraine.   Yes Historical Provider,  MD  DULoxetine (CYMBALTA) 60 MG capsule Take 1 capsule (60 mg total) by mouth daily. 09/27/14  Yes Kerrie Buffalo, NP  gabapentin (NEURONTIN) 400 MG capsule Take 1 capsule (400 mg total) by mouth 3 (three) times daily. 09/27/14  Yes Kerrie Buffalo, NP  oxyCODONE (OXYCONTIN) 60 MG 12 hr tablet Take 60 mg by mouth 2 (two) times daily.   Yes Historical Provider, MD  cloNIDine (CATAPRES) 0.1 MG tablet May take 1 tab (0.1 mg) up to  four times a day as needed. Patient not taking: Reported on 12/04/2014 09/27/14   Kerrie Buffalo, NP  tiZANidine (ZANAFLEX) 2 MG tablet Take 1 tablet (2 mg total) by mouth 3 (three) times daily as needed for muscle spasms. Patient not taking: Reported on 12/04/2014 09/27/14   Kerrie Buffalo, NP   BP 125/71 mmHg  Pulse 115  Temp(Src) 98.2 F (36.8 C) (Oral)  Resp 18  SpO2 99%  LMP 08/10/2011 Physical Exam  Constitutional: She is oriented to person, place, and time. She appears well-developed and well-nourished.  HENT:  Head: Normocephalic and atraumatic.  Eyes: EOM are normal. Pupils are equal, round, and reactive to light.  Neck: Normal range of motion. Neck supple.  Cardiovascular: Normal rate, regular rhythm and normal heart sounds.   Pulmonary/Chest: Effort normal and breath sounds normal. No respiratory distress.  Abdominal: Soft. Bowel sounds are normal. She exhibits no distension. There is no tenderness.  Musculoskeletal: Normal range of motion. She exhibits no edema.  Neurological: She is alert and oriented to person, place, and time. No cranial nerve deficit or sensory deficit.  Skin: Skin is warm and dry. No rash noted. No erythema.  Psychiatric: She has a normal mood and affect. Her behavior is normal.  Nursing note and vitals reviewed.   ED Course  Procedures (including critical care time) Labs Review Labs Reviewed - No data to display  Imaging Review No results found. I have personally reviewed and evaluated these images and lab results as part of my medical decision-making.   MDM   Final diagnoses:  Other migraine without status migrainosus, not intractable    11:31 AM Patient is a 34 year old female presenting with migraine headache. Neuro exam intact with no focal deficits. Will give migraine cocktail of NS bolus, benadryl, and reglan.   12:30 PM Pain significantly improved after migraine cocktail. Normal neuro exam with no deficits. Patient stable for  discharge home. Return precautions reviewed.  Vivi Barrack, MD 12/04/14 [place], MD 12/04/14 [phone]

## 2015-02-21 ENCOUNTER — Emergency Department (HOSPITAL_COMMUNITY)
Admission: EM | Admit: 2015-02-21 | Discharge: 2015-02-22 | Disposition: A | Discharge status: Home / Self Care | Payer: Medicaid Other | Attending: Emergency Medicine | Admitting: Emergency Medicine

## 2015-02-21 ENCOUNTER — Encounter (HOSPITAL_COMMUNITY): Payer: Self-pay | Admitting: *Deleted

## 2015-02-21 DIAGNOSIS — Z880 Allergy status to penicillin: Secondary | ICD-10-CM | POA: Insufficient documentation

## 2015-02-21 DIAGNOSIS — Z7982 Long term (current) use of aspirin: Secondary | ICD-10-CM | POA: Insufficient documentation

## 2015-02-21 DIAGNOSIS — Z79899 Other long term (current) drug therapy: Secondary | ICD-10-CM | POA: Diagnosis not present

## 2015-02-21 DIAGNOSIS — G8929 Other chronic pain: Secondary | ICD-10-CM | POA: Insufficient documentation

## 2015-02-21 DIAGNOSIS — G43009 Migraine without aura, not intractable, without status migrainosus: Secondary | ICD-10-CM

## 2015-02-21 DIAGNOSIS — Z8742 Personal history of other diseases of the female genital tract: Secondary | ICD-10-CM | POA: Insufficient documentation

## 2015-02-21 DIAGNOSIS — G43909 Migraine, unspecified, not intractable, without status migrainosus: Secondary | ICD-10-CM | POA: Insufficient documentation

## 2015-02-21 DIAGNOSIS — Z8719 Personal history of other diseases of the digestive system: Secondary | ICD-10-CM | POA: Diagnosis not present

## 2015-02-21 DIAGNOSIS — F17210 Nicotine dependence, cigarettes, uncomplicated: Secondary | ICD-10-CM | POA: Diagnosis not present

## 2015-02-21 DIAGNOSIS — Z79891 Long term (current) use of opiate analgesic: Secondary | ICD-10-CM | POA: Diagnosis not present

## 2015-02-21 DIAGNOSIS — Z8739 Personal history of other diseases of the musculoskeletal system and connective tissue: Secondary | ICD-10-CM | POA: Insufficient documentation

## 2015-02-21 DIAGNOSIS — R51 Headache: Secondary | ICD-10-CM | POA: Diagnosis present

## 2015-02-21 DIAGNOSIS — F419 Anxiety disorder, unspecified: Secondary | ICD-10-CM | POA: Insufficient documentation

## 2015-02-21 NOTE — ED Notes (Signed)
Pt states migraine headache since this morning (pain behind her eyes) associated with nausea. Took Imitrex this evening without relief.

## 2015-02-22 MED ORDER — DIPHENHYDRAMINE HCL 50 MG/ML IJ SOLN
50.0000 mg | Freq: Once | INTRAMUSCULAR | Status: AC
  Administered 2015-02-22: 50 mg via INTRAVENOUS
  Filled 2015-02-22: qty 1

## 2015-02-22 MED ORDER — SODIUM CHLORIDE 0.9 % IV BOLUS (SEPSIS)
1000.0000 mL | Freq: Once | INTRAVENOUS | Status: AC
  Administered 2015-02-22: 1000 mL via INTRAVENOUS

## 2015-02-22 MED ORDER — METOCLOPRAMIDE HCL 5 MG/ML IJ SOLN
10.0000 mg | Freq: Once | INTRAMUSCULAR | Status: AC
  Administered 2015-02-22: 10 mg via INTRAVENOUS
  Filled 2015-02-22: qty 2

## 2015-02-22 MED ORDER — DEXAMETHASONE SODIUM PHOSPHATE 10 MG/ML IJ SOLN
10.0000 mg | Freq: Once | INTRAMUSCULAR | Status: AC
  Administered 2015-02-22: 10 mg via INTRAVENOUS
  Filled 2015-02-22: qty 1

## 2015-02-22 MED ORDER — SUMATRIPTAN SUCCINATE 6 MG/0.5ML ~~LOC~~ SOLN
6.0000 mg | Freq: Once | SUBCUTANEOUS | Status: AC
  Administered 2015-02-22: 6 mg via SUBCUTANEOUS
  Filled 2015-02-22: qty 0.5

## 2015-02-22 NOTE — ED Notes (Signed)
Patient ambulatory to the BR.

## 2015-02-22 NOTE — ED Notes (Signed)
Discharge instructions reviewed - voiced understanding 

## 2015-02-22 NOTE — ED Notes (Signed)
Patient presents stating she started with a migraine yesterday and used her Imitrex 3 times without relief.  +photophobia  History of the same but stated she usually does not have trouble getting rid of her headaches

## 2015-02-22 NOTE — ED Provider Notes (Signed)
By signing my name below, I, Rohini Rajnarayanan, attest that this documentation has been prepared under the direction and in the presence of [place], DO Electronically Signed: Evonnie Dawes, ED Scribe 02/22/2015 at 1:12 AM.  TIME SEEN: 1:09 AM   CHIEF COMPLAINT:  Chief Complaint  Patient presents with  . Headache     HPI: HPI Comments: [name] [name] is a 35 y.o. female with a pmhx of migraines, who presents to the Emergency Department complaining of a constant, ongoing, gradual onset, headache which began this morning. Pt describes the pain as sharp, rated 8/10, and states that it is located behind both eyes. She also reports associated mild nausea. Pt states that current HA feels like a typical migraine. Pt states that she normally takes Imitrex with relief, however she has had no relief today. Pt denies any fevers, chills, vomiting or dizziness. Pt also takes Oxycontin for back pain. She reports photophobia. No head injury. Not on anticoagulation. No numbness, tingling or focal weakness.  ROS: See HPI Constitutional: no fever Eyes: no drainage  ENT: no runny nose   Cardiovascular:  no chest pain  Resp: no SOB  GI: no vomiting, nausea  GU: no dysuria Integumentary: no rash  Allergy: no hives  Musculoskeletal: no leg swelling  Neurological: no slurred speech, +headache ROS otherwise negative  PAST MEDICAL HISTORY/PAST SURGICAL HISTORY:  Past Medical History  Diagnosis Date  . Migraine   . GERD (gastroesophageal reflux disease)     protonix evenings  . Endometriosis     chronic pain  . Anxiety     klonipin for stress disorder  . Degenerative disc disease   . Chronic back pain     MEDICATIONS:  Prior to Admission medications   Medication Sig Start Date End Date Taking? Authorizing Provider  acetaminophen (TYLENOL) 500 MG tablet Take 1,000 mg by mouth every 6 (six) hours as needed for mild pain, moderate pain, fever or headache.    Historical Provider, MD   aspirin-acetaminophen-caffeine (EXCEDRIN MIGRAINE) [phone] MG tablet Take 2 tablets by mouth every 6 (six) hours as needed for headache or migraine.    Historical Provider, MD  cloNIDine (CATAPRES) 0.1 MG tablet May take 1 tab (0.1 mg) up to four times a day as needed. Patient not taking: Reported on 12/04/2014 09/27/14   Kerrie Buffalo, NP  DULoxetine (CYMBALTA) 60 MG capsule Take 1 capsule (60 mg total) by mouth daily. 09/27/14   Kerrie Buffalo, NP  gabapentin (NEURONTIN) 400 MG capsule Take 1 capsule (400 mg total) by mouth 3 (three) times daily. 09/27/14   Kerrie Buffalo, NP  oxyCODONE (OXYCONTIN) 60 MG 12 hr tablet Take 60 mg by mouth 2 (two) times daily.    Historical Provider, MD  tiZANidine (ZANAFLEX) 2 MG tablet Take 1 tablet (2 mg total) by mouth 3 (three) times daily as needed for muscle spasms. Patient not taking: Reported on 12/04/2014 09/27/14   Kerrie Buffalo, NP    ALLERGIES:  Allergies  Allergen Reactions  . Epidural Tray 17gx3-1-2" [Nerve Block Tray] Other (See Comments)    Paralysis and severe pain in head/neck/shoulders.  No anaphylaxis.  . Ibuprofen Hives  . Zofran [Ondansetron Hcl] Nausea And Vomiting  . Penicillins Hives    Has patient had a PCN reaction causing immediate rash, facial/tongue/throat swelling, SOB or lightheadedness with hypotension: Yes Has patient had a PCN reaction causing severe rash involving mucus membranes or skin necrosis: No Has patient had a PCN reaction that required hospitalization No Has patient  had a PCN reaction occurring within the last 10 years: No If all of the above answers are "NO", then may proceed with Cephalosporin use.     SOCIAL HISTORY:  Social History  Substance Use Topics  . Smoking status: Current Every Day Smoker -- 1.00 packs/day for 14 years    Types: Cigarettes  . Smokeless tobacco: Not on file  . Alcohol Use: No    FAMILY HISTORY: Family History  Problem Relation Age of Onset  . Coronary artery disease Father    . Coronary artery disease Maternal Grandmother   . Breast cancer Mother   . Cancer Mother     EXAM: BP 141/96 mmHg  Pulse 118  Temp(Src) 97.4 F (36.3 C) (Oral)  Resp 18  Ht 5\' 2"  (1.575 m)  Wt 185 lb (83.915 kg)  BMI 33.83 kg/m2  SpO2 96%  LMP 08/10/2011 CONSTITUTIONAL: Alert and oriented and responds appropriately to questions. Well-appearing; well-nourished HEAD: Normocephalic EYES: Conjunctivae clear, PERRL, +photophobia ENT: normal nose; no rhinorrhea; moist mucous membranes; pharynx without lesions noted NECK: Supple, no meningismus, no LAD; no nuchal rigidity CARD: RRR; S1 and S2 appreciated; no murmurs, no clicks, no rubs, no gallops RESP: Normal chest excursion without splinting or tachypnea; breath sounds clear and equal bilaterally; no wheezes, no rhonchi, no rales, no hypoxia or respiratory distress, speaking full sentences ABD/GI: Normal bowel sounds; non-distended; soft, non-tender, no rebound, no guarding, no peritoneal signs BACK:  The back appears normal and is non-tender to palpation, there is no CVA tenderness EXT: Normal ROM in all joints; non-tender to palpation; no edema; normal capillary refill; no cyanosis, no calf tenderness or swelling    SKIN: Normal color for age and race; warm NEURO: Moves all extremities equally, sensation to light touch intact diffusely, cranial nerves II through XII intact. Strength 5/5 in all four extremities. Normal gait. PSYCH: The patient's mood and manner are appropriate. Grooming and personal hygiene are appropriate.  MEDICAL DECISION MAKING: Patient here with headache typical of her prior migraines. It appears she has been treated successfully with Reglan and Benadryl the past. Has reported history of an allergic reaction to ibuprofen. We'll avoid Toradol. We'll give IV fluids, Reglan, Benadryl and Decadron. I doubt that this is a subarachnoid hemorrhage or infectious in nature. She states this is typical for her. Neurologically  intact, afebrile without meningismus on exam.  ED PROGRESS: No improvement after Reglan and Benadryl. Will give dose of subcutaneous Imitrex and reassess.   Reassessed patient. She discussed with nursing staff that she needed to get home to her son he was having a bad dream. Patient reports mild improvement in her headache. Unwilling to stay for further treatment. Given I do not see that there is any life-threatening illness present I feel she can be discharged home. She is on a pain contract for chronic back pain and has OxyContin at home. Have advised her to use her Imitrex as needed. Discussed with her return cautions. She verbalized understanding is comfortable with this plan. I feel she is safe to be discharged.     I personally performed the services described in this documentation, which was scribed in my presence. The recorded information has been reviewed and is accurate.   [place], DO 02/22/15 [phone]

## 2015-02-22 NOTE — ED Notes (Signed)
Patient stated she had to go home because her son was at home and woke up with a bad dream and she said her sister was not able to calm him down and she was leaving

## 2015-02-22 NOTE — Discharge Instructions (Signed)

## 2015-05-22 ENCOUNTER — Emergency Department (HOSPITAL_COMMUNITY)
Admission: EM | Admit: 2015-05-22 | Discharge: 2015-05-22 | Disposition: A | Discharge status: Home / Self Care | Payer: Medicaid Other | Attending: Emergency Medicine | Admitting: Emergency Medicine

## 2015-05-22 ENCOUNTER — Encounter (HOSPITAL_COMMUNITY): Payer: Self-pay

## 2015-05-22 ENCOUNTER — Inpatient Hospital Stay (HOSPITAL_COMMUNITY)
Admission: AD | Admit: 2015-05-22 | Discharge: 2015-05-28 | DRG: 897 | Disposition: A | Discharge status: Home / Self Care | Payer: Medicaid Other | Source: Intra-hospital | Attending: Psychiatry | Admitting: Psychiatry

## 2015-05-22 DIAGNOSIS — F32A Depression, unspecified: Secondary | ICD-10-CM | POA: Diagnosis present

## 2015-05-22 DIAGNOSIS — F329 Major depressive disorder, single episode, unspecified: Secondary | ICD-10-CM | POA: Diagnosis present

## 2015-05-22 DIAGNOSIS — F17210 Nicotine dependence, cigarettes, uncomplicated: Secondary | ICD-10-CM | POA: Insufficient documentation

## 2015-05-22 DIAGNOSIS — M5136 Other intervertebral disc degeneration, lumbar region: Secondary | ICD-10-CM | POA: Insufficient documentation

## 2015-05-22 DIAGNOSIS — K219 Gastro-esophageal reflux disease without esophagitis: Secondary | ICD-10-CM | POA: Insufficient documentation

## 2015-05-22 DIAGNOSIS — R45851 Suicidal ideations: Secondary | ICD-10-CM | POA: Diagnosis present

## 2015-05-22 DIAGNOSIS — F1110 Opioid abuse, uncomplicated: Secondary | ICD-10-CM | POA: Insufficient documentation

## 2015-05-22 DIAGNOSIS — Z79899 Other long term (current) drug therapy: Secondary | ICD-10-CM | POA: Insufficient documentation

## 2015-05-22 DIAGNOSIS — Z7982 Long term (current) use of aspirin: Secondary | ICD-10-CM | POA: Insufficient documentation

## 2015-05-22 DIAGNOSIS — F1120 Opioid dependence, uncomplicated: Secondary | ICD-10-CM | POA: Diagnosis present

## 2015-05-22 DIAGNOSIS — F332 Major depressive disorder, recurrent severe without psychotic features: Secondary | ICD-10-CM | POA: Diagnosis not present

## 2015-05-22 DIAGNOSIS — F1123 Opioid dependence with withdrawal: Principal | ICD-10-CM | POA: Diagnosis present

## 2015-05-22 DIAGNOSIS — Z79891 Long term (current) use of opiate analgesic: Secondary | ICD-10-CM | POA: Insufficient documentation

## 2015-05-22 HISTORY — DX: Depression, unspecified: F32.A

## 2015-05-22 LAB — CBC
HEMATOCRIT: 37.5 % (ref 36.0–46.0)
HEMOGLOBIN: 12.5 g/dL (ref 12.0–15.0)
MCH: 29.3 pg (ref 26.0–34.0)
MCHC: 33.3 g/dL (ref 30.0–36.0)
MCV: 88 fL (ref 78.0–100.0)
Platelets: 276 10*3/uL (ref 150–400)
RBC: 4.26 MIL/uL (ref 3.87–5.11)
RDW: 14 % (ref 11.5–15.5)
WBC: 9.9 10*3/uL (ref 4.0–10.5)

## 2015-05-22 LAB — COMPREHENSIVE METABOLIC PANEL
ALBUMIN: 3.8 g/dL (ref 3.5–5.0)
ALK PHOS: 94 U/L (ref 38–126)
ALT: 33 U/L (ref 14–54)
AST: 26 U/L (ref 15–41)
Anion gap: 10 (ref 5–15)
BUN: 9 mg/dL (ref 6–20)
CALCIUM: 9.5 mg/dL (ref 8.9–10.3)
CO2: 26 mmol/L (ref 22–32)
CREATININE: 0.68 mg/dL (ref 0.44–1.00)
Chloride: 105 mmol/L (ref 101–111)
GFR calc Af Amer: >60 mL/min (ref >60)
GFR calc non Af Amer: >60 mL/min (ref >60)
GLUCOSE: 143 mg/dL — AB (ref 65–99)
Potassium: 4.2 mmol/L (ref 3.5–5.1)
SODIUM: 141 mmol/L (ref 135–145)
Total Bilirubin: 0.4 mg/dL (ref 0.3–1.2)
Total Protein: 7.7 g/dL (ref 6.5–8.1)

## 2015-05-22 LAB — RAPID URINE DRUG SCREEN, HOSP PERFORMED
Amphetamines: NOT DETECTED
BARBITURATES: NOT DETECTED
Benzodiazepines: NOT DETECTED
COCAINE: NOT DETECTED
Opiates: POSITIVE — AB
TETRAHYDROCANNABINOL: NOT DETECTED

## 2015-05-22 LAB — ETHANOL: Alcohol, Ethyl (B): <5 mg/dL (ref <5)

## 2015-05-22 LAB — SALICYLATE LEVEL: Salicylate Lvl: <4 mg/dL (ref 2.8–30.0)

## 2015-05-22 LAB — ACETAMINOPHEN LEVEL

## 2015-05-22 MED ORDER — CLONIDINE HCL 0.1 MG PO TABS: 0.1000 mg | ORAL_TABLET | Freq: Four times a day (QID) | ORAL | Status: DC

## 2015-05-22 MED ORDER — DICYCLOMINE HCL 20 MG PO TABS
20.0000 mg | ORAL_TABLET | Freq: Four times a day (QID) | ORAL | Status: AC | PRN
  Administered 2015-05-23: 20 mg via ORAL
  Filled 2015-05-22: qty 1

## 2015-05-22 MED ORDER — ACETAMINOPHEN 500 MG PO TABS: 1000.0000 mg | ORAL_TABLET | Freq: Four times a day (QID) | ORAL | Status: DC | PRN

## 2015-05-22 MED ORDER — LOPERAMIDE HCL 2 MG PO CAPS
2.0000 mg | ORAL_CAPSULE | ORAL | Status: DC | PRN
Start: 2015-05-22 — End: 2015-05-22

## 2015-05-22 MED ORDER — CLONIDINE HCL 0.1 MG PO TABS: 0.1000 mg | ORAL_TABLET | Freq: Every day | ORAL | Status: DC

## 2015-05-22 MED ORDER — HYDROXYZINE HCL 25 MG PO TABS
25.0000 mg | ORAL_TABLET | Freq: Four times a day (QID) | ORAL | Status: AC | PRN
  Administered 2015-05-23 – 2015-05-24 (×2): 25 mg via ORAL
  Filled 2015-05-22 (×3): qty 1

## 2015-05-22 MED ORDER — CLONIDINE HCL 0.1 MG PO TABS: 0.1000 mg | ORAL_TABLET | ORAL | Status: DC

## 2015-05-22 MED ORDER — METHOCARBAMOL 500 MG PO TABS
500.0000 mg | ORAL_TABLET | Freq: Three times a day (TID) | ORAL | Status: DC | PRN
  Administered 2015-05-22: 500 mg via ORAL
  Filled 2015-05-22: qty 1

## 2015-05-22 MED ORDER — NICOTINE 21 MG/24HR TD PT24
21.0000 mg | MEDICATED_PATCH | Freq: Every day | TRANSDERMAL | Status: DC
  Administered 2015-05-23 – 2015-05-25 (×3): 21 mg via TRANSDERMAL
  Filled 2015-05-22 (×8): qty 1

## 2015-05-22 MED ORDER — MAGNESIUM HYDROXIDE 400 MG/5ML PO SUSP: 30.0000 mL | Freq: Every day | ORAL | Status: DC | PRN

## 2015-05-22 MED ORDER — TRAZODONE HCL 50 MG PO TABS
50.0000 mg | ORAL_TABLET | Freq: Every evening | ORAL | Status: DC | PRN
  Administered 2015-05-23 – 2015-05-25 (×5): 50 mg via ORAL
  Filled 2015-05-22 (×5): qty 1

## 2015-05-22 MED ORDER — HYDROXYZINE HCL 25 MG PO TABS
25.0000 mg | ORAL_TABLET | Freq: Four times a day (QID) | ORAL | Status: DC | PRN
  Administered 2015-05-22: 25 mg via ORAL
  Filled 2015-05-22: qty 1

## 2015-05-22 MED ORDER — ASPIRIN-ACETAMINOPHEN-CAFFEINE 250-250-65 MG PO TABS: 2.0000 | ORAL_TABLET | Freq: Four times a day (QID) | ORAL | Status: DC | PRN

## 2015-05-22 MED ORDER — DICYCLOMINE HCL 20 MG PO TABS: 20.0000 mg | ORAL_TABLET | Freq: Four times a day (QID) | ORAL | Status: DC | PRN

## 2015-05-22 MED ORDER — CLONIDINE HCL 0.1 MG PO TABS
0.1000 mg | ORAL_TABLET | Freq: Four times a day (QID) | ORAL | Status: DC
  Administered 2015-05-23 – 2015-05-24 (×5): 0.1 mg via ORAL
  Filled 2015-05-22 (×10): qty 1

## 2015-05-22 MED ORDER — GABAPENTIN 400 MG PO CAPS
400.0000 mg | ORAL_CAPSULE | Freq: Three times a day (TID) | ORAL | Status: DC
  Administered 2015-05-22: 400 mg via ORAL
  Filled 2015-05-22: qty 1

## 2015-05-22 MED ORDER — DULOXETINE HCL 60 MG PO CPEP
60.0000 mg | ORAL_CAPSULE | Freq: Every day | ORAL | Status: DC
  Administered 2015-05-22: 60 mg via ORAL
  Filled 2015-05-22: qty 1

## 2015-05-22 MED ORDER — LOPERAMIDE HCL 2 MG PO CAPS: 2.0000 mg | ORAL_CAPSULE | ORAL | Status: AC | PRN

## 2015-05-22 MED ORDER — CLONIDINE HCL 0.1 MG PO TABS
0.1000 mg | ORAL_TABLET | Freq: Once | ORAL | Status: AC
  Administered 2015-05-22: 0.1 mg via ORAL
  Filled 2015-05-22: qty 1

## 2015-05-22 MED ORDER — ACETAMINOPHEN 325 MG PO TABS
650.0000 mg | ORAL_TABLET | Freq: Four times a day (QID) | ORAL | Status: DC | PRN
  Administered 2015-05-23 – 2015-05-27 (×8): 650 mg via ORAL
  Filled 2015-05-22 (×8): qty 2

## 2015-05-22 MED ORDER — METHOCARBAMOL 500 MG PO TABS
500.0000 mg | ORAL_TABLET | Freq: Three times a day (TID) | ORAL | Status: AC | PRN
  Administered 2015-05-23 – 2015-05-27 (×3): 500 mg via ORAL
  Filled 2015-05-22 (×3): qty 1

## 2015-05-22 NOTE — Tx Team (Addendum)
Initial Interdisciplinary Treatment Plan   PATIENT STRESSORS: Financial difficulties Legal issue Substance abuse   PATIENT STRENGTHS: Capable of independent living Supportive family/friends   PROBLEM LIST: Problem List/Patient Goals Date to be addressed Date deferred Reason deferred Estimated date of resolution  Substance abuse 05/22/15     Depression 05/22/15     Suicidal ideation 05/22/15     "Get back on suboxone because I don't want to use drugs anymore" 05/22/15     "I want to feel better about myself" 05/22/15     Anxiety  05/22/15                        DISCHARGE CRITERIA:  Improved stabilization in mood, thinking, and/or behavior Verbal commitment to aftercare and medication compliance Withdrawal symptoms are absent or subacute and managed without 24-hour nursing intervention  PRELIMINARY DISCHARGE PLAN: Outpatient therapy Medication management  PATIENT/FAMIILY INVOLVEMENT: This treatment plan has been presented to and reviewed with the patient, Jameika Hendee.  The patient and family have been given the opportunity to ask questions and make suggestions.  Barbette Or Jenavee Laguardia 05/22/2015, 10:04 PM

## 2015-05-22 NOTE — ED Provider Notes (Signed)
CSN: [id number]     Arrival date & time 05/22/15  1249 History   First MD Initiated Contact with Patient 05/22/15 1407     Chief Complaint  Patient presents with  . Suicidal     (Consider location/radiation/quality/duration/timing/severity/associated sxs/prior Treatment) The history is provided by the patient.  [name] [name] is a 35 y.o. female hx of GERD, anxiety, chronic back pain on suboxone here with suicidal ideations, Drug detox. Patient states that she has been feeling very depressed and wants to kill herself at once to overdose on medications. About several months ago, patient relapsed on her narcotics. For the last 4 days she refilled her the box own and then since then has been having withdrawal symptoms. She states that she feels tremulous and anxious. She also has been more depressed and plans to kill herself and doesn't want to live anymore. Denies any alcohol or other drug use.    Past Medical History  Diagnosis Date  . Migraine   . GERD (gastroesophageal reflux disease)     protonix evenings  . Endometriosis     chronic pain  . Anxiety     klonipin for stress disorder  . Degenerative disc disease   . Chronic back pain    Past Surgical History  Procedure Laterality Date  . Appendectomy    . Tonsillectomy    . Tubal ligation    . Laparoscopic assisted vaginal hysterectomy  08/19/2011    Procedure: LAPAROSCOPIC ASSISTED VAGINAL HYSTERECTOMY;  Surgeon: Gus Height, MD;  Location: [facility];  Service: Gynecology;  Laterality: N/A;  . Abdominal hysterectomy     Family History  Problem Relation Age of Onset  . Coronary artery disease Father   . Coronary artery disease Maternal Grandmother   . Breast cancer Mother   . Cancer Mother    Social History  Substance Use Topics  . Smoking status: Current Every Day Smoker -- 1.00 packs/day for 14 years    Types: Cigarettes  . Smokeless tobacco: None  . Alcohol Use: No   OB History    Gravida Para Term Preterm AB TAB SAB  Ectopic Multiple Living   6 2             Review of Systems  Psychiatric/Behavioral: Positive for suicidal ideas and dysphoric mood.  All other systems reviewed and are negative.     Allergies  Epidural tray 17gx3-1-2"; Ibuprofen; Zofran; and Penicillins  Home Medications   Prior to Admission medications   Medication Sig Start Date End Date Taking? Authorizing Provider  acetaminophen (TYLENOL) 500 MG tablet Take 1,000 mg by mouth every 6 (six) hours as needed for mild pain, moderate pain, fever or headache.   Yes Historical Provider, MD  aspirin-acetaminophen-caffeine (EXCEDRIN MIGRAINE) [phone] MG tablet Take 2 tablets by mouth every 6 (six) hours as needed for headache or migraine.   Yes Historical Provider, MD  Buprenorphine HCl-Naloxone HCl (SUBOXONE) 8-2 MG FILM Place 2.5 each under the tongue daily.   Yes Historical Provider, MD  DULoxetine (CYMBALTA) 60 MG capsule Take 1 capsule (60 mg total) by mouth daily. 09/27/14  Yes Kerrie Buffalo, NP  gabapentin (NEURONTIN) 400 MG capsule Take 1 capsule (400 mg total) by mouth 3 (three) times daily. 09/27/14  Yes Kerrie Buffalo, NP  cloNIDine (CATAPRES) 0.1 MG tablet May take 1 tab (0.1 mg) up to four times a day as needed. Patient not taking: Reported on 12/04/2014 09/27/14   Kerrie Buffalo, NP  tiZANidine (ZANAFLEX) 2 MG tablet Take 1  tablet (2 mg total) by mouth 3 (three) times daily as needed for muscle spasms. Patient not taking: Reported on 12/04/2014 09/27/14   Kerrie Buffalo, NP   BP 122/79 mmHg  Pulse 110  Temp(Src) 98.7 F (37.1 C) (Oral)  Resp 16  SpO2 96%  LMP 08/10/2011 Physical Exam  Constitutional: She is oriented to person, place, and time.  Depressed   HENT:  Head: Normocephalic.  Mouth/Throat: Oropharynx is clear and moist.  Eyes: Conjunctivae are normal. Pupils are equal, round, and reactive to light.  Neck: Normal range of motion. Neck supple.  Cardiovascular: Regular rhythm and normal heart sounds.   Slightly  tachy   Pulmonary/Chest: Effort normal and breath sounds normal. No respiratory distress. She has no wheezes. She has no rales.  Abdominal: Soft. Bowel sounds are normal. She exhibits no distension. There is no tenderness. There is no rebound.  Musculoskeletal: Normal range of motion. She exhibits no edema or tenderness.  Neurological: She is alert and oriented to person, place, and time. No cranial nerve deficit. Coordination normal.  Skin: Skin is warm and dry.  Psychiatric:  Depressed,   Nursing note and vitals reviewed.   ED Course  Procedures (including critical care time) Labs Review Labs Reviewed  COMPREHENSIVE METABOLIC PANEL - Abnormal; Notable for the following:    Glucose, Bld 143 (*)    All other components within normal limits  ACETAMINOPHEN LEVEL - Abnormal; Notable for the following:    Acetaminophen (Tylenol), Serum <10 (*)    All other components within normal limits  ETHANOL  SALICYLATE LEVEL  CBC  URINE RAPID DRUG SCREEN, HOSP PERFORMED    Imaging Review No results found. I have personally reviewed and evaluated these images and lab results as part of my medical decision-making.   EKG Interpretation None      MDM   Final diagnoses:  None   [name] is a 35 y.o. female here with depression, suicidal ideation, opioid abuse. Slightly tachy, likely mild opioid withdrawal. Will get TTS consult, psych clearance labs. Will give clonidine for withdrawal.   4:22 PM Labs unremarkable. TTS consulted. Will start on clonidine for opioid withdrawal.    Wandra Arthurs, MD 05/22/15 1622
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
## 2015-05-22 NOTE — ED Notes (Signed)
Pt was on suboxone x 3 months.  Had relapse.  Pt was taking pain pills for 4-5 times a day for a month.  Pt started her suboxone back 3 days ago along with the narcotics.  Pt states it is making her sick and she is having thoughts of taking pills to overdose.
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
## 2015-05-22 NOTE — ED Notes (Signed)
Pt oriented to room and unit.  Pt complains of symptoms of withdrawal.  15 minute checks and video monitoring in place.

## 2015-05-22 NOTE — Progress Notes (Signed)
D: Patient resting in bed with eyes closed.  Respirations even and unlabored.  Patient appears to be in no apparent distress. A: Staff to monitor Q 15 mins for safety.   R:Patient remains safe on the unit.  

## 2015-05-22 NOTE — ED Notes (Signed)
Pt. has one belonging bag with two purses at the nurses desk.

## 2015-05-22 NOTE — ED Notes (Signed)
Pt sent to [facility] via [employer] All belongings given to transporter. No s/s of distress noted.

## 2015-05-23 DIAGNOSIS — F1120 Opioid dependence, uncomplicated: Secondary | ICD-10-CM

## 2015-05-23 DIAGNOSIS — R45851 Suicidal ideations: Secondary | ICD-10-CM

## 2015-05-23 DIAGNOSIS — F332 Major depressive disorder, recurrent severe without psychotic features: Secondary | ICD-10-CM

## 2015-05-23 MED ORDER — BUPRENORPHINE HCL 8 MG SL SUBL
12.0000 mg | SUBLINGUAL_TABLET | Freq: Every day | SUBLINGUAL | Status: DC
  Administered 2015-05-24 – 2015-05-25 (×2): 12 mg via SUBLINGUAL

## 2015-05-23 MED ORDER — BUPRENORPHINE HCL 8 MG SL SUBL
8.0000 mg | SUBLINGUAL_TABLET | Freq: Every day | SUBLINGUAL | Status: DC
  Administered 2015-05-23 – 2015-05-24 (×2): 8 mg via SUBLINGUAL
  Filled 2015-05-23 (×5): qty 1

## 2015-05-23 NOTE — [facility] Group Notes (Signed)
[facility] LCSW Group Therapy  05/23/2015 2:56 PM  Type of Therapy:  Group Therapy  Participation Level:  Did Not Attend-pt invited. Chose to remain in bed.   Summary of Progress/Problems: Today's Topic: Overcoming Obstacles. Patients identified one short term goal and potential obstacles in reaching this goal. Patients processed barriers involved in overcoming these obstacles. Patients identified steps necessary for overcoming these obstacles and explored motivation (internal and external) for facing these difficulties head on.   Smart, Adelina Collard LCSW 05/23/2015, 2:56 PM

## 2015-05-23 NOTE — H&P (Signed)
Psychiatric Admission Assessment Adult  Patient Identification: [name] [name]  MRN:  [id number]  Date of Evaluation:  05/23/2015  Chief Complaint:  MDD RECURRENT SEVERE without psychotic features  Principal Diagnosis: Opioid use disorder, severe, dependence ([name] [name])  Diagnosis:   Patient Active Problem List   Diagnosis Date Noted  . MDD (major depressive disorder) ([name] [name]) [F32.9] 05/22/2015  . MDD (major depressive disorder), recurrent severe, without psychosis ([name] [name]) [F33.2] 09/22/2014  . Opioid use disorder, severe, dependence ([name] [name]) [F11.20] 09/22/2014  . Hypokalemia [E87.6] 09/22/2014  . Tylenol overdose [T39.1X4A] 09/19/2014  . Suicide attempt ([name] [name]) [T14.91] 09/19/2014  . GERD (gastroesophageal reflux disease) [K21.9] 09/19/2014  . Drug dependence ([name] [name]) [F19.20]   . Polysubstance (including opioids) dependence with physiological dependence ([name] [name]) [F19.20] 07/08/2013  . Suicidal ideation [R45.851] 07/06/2013  . Nausea & vomiting [R11.2] 07/06/2013  . Chronic pain disorder [G89.4] 07/06/2013  . Cocaine abuse [F14.10] 07/06/2013  . Major depression ([name] [name]) [F32.9] 01/05/2012  . Alcohol dependence ([name] [name]) [F10.20] 01/04/2012  . Opioid dependence ([name] [name]) [F11.20] 12/29/2011  . Back pain [M54.9] 12/29/2011  . N&V (nausea and vomiting) [R11.2] 06/21/2011  . Tachycardia [R00.0] 06/21/2011  . Abdominal pain [R10.9] 06/21/2011  . Leukocytosis [D72.829] 06/21/2011   History of Present Illness: This is one of several admission assessments for this 35 year old Caucasian female. [name] [name] has been a patient in this hospital x multiple times all related to drug addiction & substance induced mood disorder. She is currently being admitted to the [facility] adult unit for opioid intoxication & withdrawal symptoms requiring detoxification treatments. During this assessment, [name] [name] reports, "My husband took me to the [facility] yesterday. I was having suicidal ideations & opioid withdrawal symptoms. I have had  the suicidal thoughts for a couple of days. I was on Suboxen treatment for opioid addiction for 6 months. I really do not know what happened, but I relapsed on opioid about a month ago. I have been using heroin & other narcotic pain pills for about a month. I had stopped using my Suboxen for 3 days while using the other drugs, when I resumed,  It made me very sick. I did not attempt to hurt myself this time, but have had 2 separate suicide attempts in the past by overdose on Tylenol. I want to resume my Suboxen treatment today. I'm going through bad withdrawals right now".  Associated Signs/Symptoms:  Depression Symptoms:  depressed mood, insomnia, psychomotor agitation, suicidal thoughts with specific plan, anxiety, weight loss,  (Hypo) Manic Symptoms:  Impulsivity, Irritable Mood,  Anxiety Symptoms:  Reports high anxiety levels  Psychotic Symptoms:  Denies any hallucinations, delusional thoughts or paranoia  PTSD Symptoms: None reported  Total Time spent with patient: 1 hour  Past Psychiatric History: Opioid use disorder, chronic  Is the patient at risk to self? No.  (She is endorsing SI without intent or plans) Has the patient been a risk to self in the past 6 months? No.  Has the patient been a risk to self within the distant past? Yes.    Is the patient a risk to others? No.  Has the patient been a risk to others in the past 6 months? No.  Has the patient been a risk to others within the distant past? No.   Prior Inpatient Therapy: Yes ([facility] x numerous time) Prior Outpatient Therapy: Yes  Alcohol Screening: 1. How often do you have a drink containing alcohol?: Never 9. Have you or someone else been injured as a result of your drinking?: No  10. Has a relative or friend or a doctor or another health worker been concerned about your drinking or suggested you cut down?: No Alcohol Use Disorder Identification Test Final Score (AUDIT): 0 Brief Intervention: AUDIT score less than  7 or less-screening does not suggest unhealthy drinking-brief intervention not indicated  Substance Abuse History in the last 12 months:  Yes.    Consequences of Substance Abuse: Medical Consequences:  Liver damage, Possible death by overdose Legal Consequences:  Arrests, jail time, Loss of driving privilege. Family Consequences:  Family discord, divorce and or separation.  Previous Psychotropic Medications: Yes   Psychological Evaluations: Yes   Past Medical History:  Past Medical History  Diagnosis Date  . Migraine   . GERD (gastroesophageal reflux disease)     protonix evenings  . Endometriosis     chronic pain  . Anxiety     klonipin for stress disorder  . Degenerative disc disease   . Chronic back pain     Past Surgical History  Procedure Laterality Date  . Appendectomy    . Tonsillectomy    . Tubal ligation    . Laparoscopic assisted vaginal hysterectomy  08/19/2011    Procedure: LAPAROSCOPIC ASSISTED VAGINAL HYSTERECTOMY;  Surgeon: Gus Height, MD;  Location: [facility];  Service: Gynecology;  Laterality: N/A;  . Abdominal hysterectomy     Family History:  Family History  Problem Relation Age of Onset  . Coronary artery disease Father   . Coronary artery disease Maternal Grandmother   . Breast cancer Mother   . Cancer Mother    Family Psychiatric  History: Alcoholism: Father  Tobacco Screening:   Social History:  History  Alcohol Use No     History  Drug Use  . Yes  . Special: Heroin    Comment: denies    Additional Social History: Pain Medications: yes pt states she has taken oxycontin 60 mg bid and opana q8 hours in the past; patient taking suboxone for the past year Prescriptions: suboxone Over the Counter: none History of alcohol / drug use?: Yes Longest period of sobriety (when/how long): pt denies alcohol and / or other drug abuse " on over 10 yrs" Negative Consequences of Use: Legal Withdrawal Symptoms: Anorexia, Fever / Chills, Tremors Name of  Substance 1: Suboxone  1 - Age of First Use: 34 1 - Amount (size/oz): "Almost 1 year"; 32 milligrams daily  1 - Frequency: daily for almost 1 year  1 - Duration: "on-going for 1 year" 1 - Last Use / Amount: 05/21/15 Name of Substance 2: Opiates "I'm sure exactly what kind but it was crushed up"; Pt suspects it was crused up Heroin and Fentanly. (relapsed 1-2 days ago) 2 - Age of First Use: 20's 2 - Amount (size/oz): unk 2 - Frequency: daily  2 - Duration: 1 day; relapsed 2 - Last Use / Amount: Started a month ago  Allergies:   Allergies  Allergen Reactions  . Epidural Tray 17gx3-1-2" [Nerve Block Tray] Other (See Comments)    Paralysis and severe pain in head/neck/shoulders.  No anaphylaxis.  . Ibuprofen Hives  . Zofran [Ondansetron Hcl] Nausea And Vomiting  . Penicillins Hives    Has patient had a PCN reaction causing immediate rash, facial/tongue/throat swelling, SOB or lightheadedness with hypotension: Yes Has patient had a PCN reaction causing severe rash involving mucus membranes or skin necrosis: No Has patient had a PCN reaction that required hospitalization No Has patient had a PCN reaction occurring within the last 10  years: No If all of the above answers are "NO", then may proceed with Cephalosporin use.    Lab Results:  Results for orders placed or performed during the hospital encounter of 05/22/15 (from the past 48 hour(s))  Comprehensive metabolic panel     Status: Abnormal   Collection Time: 05/22/15  2:21 PM  Result Value Ref Range   Sodium 141 135 - 145 mmol/L   Potassium 4.2 3.5 - 5.1 mmol/L   Chloride 105 101 - 111 mmol/L   CO2 26 22 - 32 mmol/L   Glucose, Bld 143 (H) 65 - 99 mg/dL   BUN 9 6 - 20 mg/dL   Creatinine, Ser 0.68 0.44 - 1.00 mg/dL   Calcium 9.5 8.9 - 10.3 mg/dL   Total Protein 7.7 6.5 - 8.1 g/dL   Albumin 3.8 3.5 - 5.0 g/dL   AST 26 15 - 41 U/L   ALT 33 14 - 54 U/L   Alkaline Phosphatase 94 38 - 126 U/L   Total Bilirubin 0.4 0.3 - 1.2 mg/dL    GFR calc non Af Amer >60 >60 mL/min   GFR calc Af Amer >60 >60 mL/min    Comment: (NOTE) The eGFR has been calculated using the CKD EPI equation. This calculation has not been validated in all clinical situations. eGFR's persistently <60 mL/min signify possible Chronic Kidney Disease.    Anion gap 10 5 - 15  cbc     Status: None   Collection Time: 05/22/15  2:21 PM  Result Value Ref Range   WBC 9.9 4.0 - 10.5 K/uL   RBC 4.26 3.87 - 5.11 MIL/uL   Hemoglobin 12.5 12.0 - 15.0 g/dL   HCT 37.5 36.0 - 46.0 %   MCV 88.0 78.0 - 100.0 fL   MCH 29.3 26.0 - 34.0 pg   MCHC 33.3 30.0 - 36.0 g/dL   RDW 14.0 11.5 - 15.5 %   Platelets 276 150 - 400 K/uL  Ethanol     Status: None   Collection Time: 05/22/15  2:22 PM  Result Value Ref Range   Alcohol, Ethyl (B) <5 <5 mg/dL    Comment:        LOWEST DETECTABLE LIMIT FOR SERUM ALCOHOL IS 5 mg/dL FOR MEDICAL PURPOSES ONLY   Salicylate level     Status: None   Collection Time: 05/22/15  2:22 PM  Result Value Ref Range   Salicylate Lvl <6.0 2.8 - 30.0 mg/dL  Acetaminophen level     Status: Abnormal   Collection Time: 05/22/15  2:22 PM  Result Value Ref Range   Acetaminophen (Tylenol), Serum <10 (L) 10 - 30 ug/mL    Comment:        THERAPEUTIC CONCENTRATIONS VARY SIGNIFICANTLY. A RANGE OF 10-30 ug/mL MAY BE AN EFFECTIVE CONCENTRATION FOR MANY PATIENTS. HOWEVER, SOME ARE BEST TREATED AT CONCENTRATIONS OUTSIDE THIS RANGE. ACETAMINOPHEN CONCENTRATIONS >150 ug/mL AT 4 HOURS AFTER INGESTION AND >50 ug/mL AT 12 HOURS AFTER INGESTION ARE OFTEN ASSOCIATED WITH TOXIC REACTIONS.   Rapid urine drug screen (hospital performed)     Status: Abnormal   Collection Time: 05/22/15  4:21 PM  Result Value Ref Range   Opiates POSITIVE (A) NONE DETECTED   Cocaine NONE DETECTED NONE DETECTED   Benzodiazepines NONE DETECTED NONE DETECTED   Amphetamines NONE DETECTED NONE DETECTED   Tetrahydrocannabinol NONE DETECTED NONE DETECTED   Barbiturates NONE  DETECTED NONE DETECTED    Comment:        DRUG SCREEN FOR MEDICAL  PURPOSES ONLY.  IF CONFIRMATION IS NEEDED FOR ANY PURPOSE, NOTIFY LAB WITHIN 5 DAYS.        LOWEST DETECTABLE LIMITS FOR URINE DRUG SCREEN Drug Class       Cutoff (ng/mL) Amphetamine      1000 Barbiturate      200 Benzodiazepine   200 Tricyclics       300 Opiates          300 Cocaine          300 THC              50    Blood Alcohol level:  Lab Results  Component Value Date   ETH <5 05/22/2015   ETH <5 08/13/2014   Metabolic Disorder Labs:  Lab Results  Component Value Date   HGBA1C 5.4 12/29/2011   MPG 108 12/29/2011   MPG 111 06/22/2011   No results found for: PROLACTIN Lab Results  Component Value Date   CHOL 205* 12/29/2011   TRIG 269* 12/29/2011   HDL 28* 12/29/2011   CHOLHDL 7.3 12/29/2011   VLDL 54* 12/29/2011   LDLCALC 123* 12/29/2011   Current Medications: Current Facility-Administered Medications  Medication Dose Route Frequency Provider Last Rate Last Dose  . acetaminophen (TYLENOL) tablet 650 mg  650 mg Oral Q6H PRN Kerry Hough, PA-C      . cloNIDine (CATAPRES) tablet 0.1 mg  0.1 mg Oral QID Kerry Hough, PA-C   0.1 mg at 05/23/15 3310   Followed by  . [START ON 05/25/2015] cloNIDine (CATAPRES) tablet 0.1 mg  0.1 mg Oral BH-qamhs Spencer E Simon, PA-C       Followed by  . [START ON 05/28/2015] cloNIDine (CATAPRES) tablet 0.1 mg  0.1 mg Oral QAC breakfast Kerry Hough, PA-C      . dicyclomine (BENTYL) tablet 20 mg  20 mg Oral Q6H PRN Kerry Hough, PA-C   20 mg at 05/23/15 7881  . hydrOXYzine (ATARAX/VISTARIL) tablet 25 mg  25 mg Oral Q6H PRN Kerry Hough, PA-C      . loperamide (IMODIUM) capsule 2-4 mg  2-4 mg Oral PRN Kerry Hough, PA-C      . magnesium hydroxide (MILK OF MAGNESIA) suspension 30 mL  30 mL Oral Daily PRN Kerry Hough, PA-C      . methocarbamol (ROBAXIN) tablet 500 mg  500 mg Oral Q8H PRN Kerry Hough, PA-C   500 mg at 05/23/15 0827  . nicotine  (NICODERM CQ - dosed in mg/24 hours) patch 21 mg  21 mg Transdermal Q0600 Kerry Hough, PA-C   21 mg at 05/23/15 0825  . traZODone (DESYREL) tablet 50 mg  50 mg Oral QHS PRN,MR X 1 Spencer E Simon, PA-C       PTA Medications: Prescriptions prior to admission  Medication Sig Dispense Refill Last Dose  . acetaminophen (TYLENOL) 500 MG tablet Take 1,000 mg by mouth every 6 (six) hours as needed for mild pain, moderate pain, fever or headache.   05/22/2015 at Unknown time  . aspirin-acetaminophen-caffeine (EXCEDRIN MIGRAINE) 250-250-65 MG tablet Take 2 tablets by mouth every 6 (six) hours as needed for headache or migraine.   05/22/2015 at Unknown time  . Buprenorphine HCl-Naloxone HCl (SUBOXONE) 8-2 MG FILM Place 2.5 each under the tongue daily.   05/22/2015 at Unknown time  . DULoxetine (CYMBALTA) 60 MG capsule Take 1 capsule (60 mg total) by mouth daily. 30 capsule 0 unknown  . gabapentin (NEURONTIN) 400  MG capsule Take 1 capsule (400 mg total) by mouth 3 (three) times daily. 90 capsule 0 unknown  . cloNIDine (CATAPRES) 0.1 MG tablet May take 1 tab (0.1 mg) up to four times a day as needed. (Patient not taking: Reported on 12/04/2014) 8 tablet 0 Not Taking at Unknown time  . tiZANidine (ZANAFLEX) 2 MG tablet Take 1 tablet (2 mg total) by mouth 3 (three) times daily as needed for muscle spasms. (Patient not taking: Reported on 12/04/2014) 90 tablet 0 Not Taking at Unknown time   Musculoskeletal: Strength & Muscle Tone: within normal limits Gait & Station: normal Patient leans: N/A  Psychiatric Specialty Exam: Physical Exam  Constitutional: She is oriented to person, place, and time. She appears well-developed and well-nourished.  HENT:  Head: Normocephalic.  Eyes: Pupils are equal, round, and reactive to light.  Neck: Normal range of motion.  Cardiovascular: Normal rate.   Respiratory: Effort normal.  GI: Soft.  Genitourinary:  Denies any issues in this area  Musculoskeletal: Normal range  of motion.  Neurological: She is alert and oriented to person, place, and time.  Skin: Skin is warm and dry.  Psychiatric: Her speech is normal and behavior is normal. Thought content normal. Her mood appears anxious. Her affect is not angry, not blunt, not labile and not inappropriate. Cognition and memory are normal. She expresses impulsivity. She exhibits a depressed mood.    Review of Systems  Constitutional: Positive for chills, malaise/fatigue and diaphoresis.  HENT: Negative.   Eyes: Negative.   Respiratory: Negative.   Cardiovascular: Negative.   Gastrointestinal: Positive for nausea.  Genitourinary: Negative.   Musculoskeletal: Positive for myalgias.  Skin: Negative.   Neurological: Negative.   Endo/Heme/Allergies: Negative.   Psychiatric/Behavioral: Positive for depression, suicidal ideas (Denies any intent or plans) and substance abuse (Opioid dependence). Negative for hallucinations and memory loss. The patient is nervous/anxious and has insomnia.     Blood pressure 135/86, pulse 118, temperature 98.2 F (36.8 C), temperature source Oral, resp. rate 18, height '5\' 3"'$  (1.6 m), weight 92.08 kg (203 lb), last menstrual period 08/10/2011, SpO2 99 %.Body mass index is 35.97 kg/(m^2).  General Appearance: Disheveled  Eye Sport and exercise psychologist::  Fair  Speech:  Clear, not spontaneous  Volume:  Decreased  Mood:  Anxious, Dysphoric and Irritable  Affect:  Constricted  Thought Process:  Coherent  Orientation:  Full (Time, Place, and Person)  Thought Content:  Ruminations, denies any hallucinations, delusions or paranoia  Suicidal Thoughts:  Yes.  without intent/plan  Homicidal Thoughts:  No  Memory:  Immediate;   Good Recent;   Good Remote;   Good  Judgement:  Fair  Insight:  Lacking  Psychomotor Activity:  Restlessness  Concentration:  Poor  Recall:  [place] of Knowledge:Fair  Language: Good  Akathisia:  Negative  Handed:  Right  AIMS (if indicated):     Assets:  Desire for  Improvement  ADL's:  Intact  Cognition: WNL  Sleep:  Number of Hours: 6.75   Treatment Plan Summary: Daily contact with patient to assess and evaluate symptoms and progress in treatment and Medication management: Treatment Plan/Recommendations: 1. Admit for crisis management and stabilization, estimated length of stay 3-5 days.  2. Medication management to reduce current symptoms to base line and improve the patient's overall level of functioning; Clonidine detox protocols, Trazodone 50 mg for insomnia  3. Treat health problems as indicated.  4. Develop treatment plan to decrease risk of relapse upon discharge and the need for readmission.  5. Psycho-social education regarding relapse prevention and self care.  6. Health care follow up as needed for medical problems.  7. Review, reconcile, and reinstate any pertinent home medications for other health issues where appropriate. 8. Call for consults with hospitalist for any additional specialty patient care services as needed.  Observation Level/Precautions:  15 minute checks  Laboratory:  Per ED, UDS positive for Opioid  Psychotherapy: Group sessions  Medications: Clonidine detox protocols, Trazodone 50 mg for insomnia  Consultations: As needed  Discharge Concerns: Sobriety   Estimated LOS: 2-4 days  Other: Admit to [id number]   I certify that inpatient services furnished can reasonably be expected to improve the patient's condition.    Encarnacion Slates, NP, PMHNP-BC 5/3/201711:09 AM I personally assessed the patient, reviewed the physical exam and labs and formulated the treatment plan Geralyn Flash A. Sabra Heck, M.D.

## 2015-05-23 NOTE — Progress Notes (Signed)
Patient ID: [name] [name], female   DOB: 10-13-1980, 35 y.o.   MRN: [id number]  DAR: Pt. Denies SI/HI and A/V Hallucinations. She reports sleep is poor, appetite is poor, energy level is low, and concentration is fair. She rates depression 8/10, hopelessness 6/10, and anxiety 8-10/10. She reports withdrawal symptoms including abdominal cramping, pain, irritability, nausea, and cravings. Patient reports spasm in lower back and received PRN Robaxin for this. Support and encouragement provided to the patient to come into milieu as much as possible but patient is resistant. Scheduled medications administered to patient per physician's orders. Patient is minimal and isolative. Staying in her bed throughout the day with the lights off and curtains drawn. She reports not feeling well enough to get out of bed for meals or medications. Q15 minute checks are maintained for safety.

## 2015-05-23 NOTE — Progress Notes (Signed)
Patient did not attend N/A group tonight.  
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
## 2015-05-23 NOTE — Progress Notes (Signed)
Recreation Therapy Notes  Date: 05.03.2017 Time: 9:30am Location: 300 Hall Group Room   Group Topic: Stress Management  Goal Area(s) Addresses:  Patient will actively participate in stress management techniques presented during session.   Behavioral Response: Did not attend.   Laureen Ochs Alysia Scism, LRT/CTRS        Lane Hacker 05/23/2015 12:37 PM

## 2015-05-23 NOTE — Progress Notes (Signed)
D:Patient in the hallway on approach.  Patient states she is having bad withdrawal symptoms.  Patient states her goal was to get rest today and to get her medications to help with withdrawal. Patient denies SI/HI and denies AVH.   A: Staff to monitor Q 15 mins for safety.  Encouragement and support offered.  Scheduled medications administered per orders.  Several prn medications administered tonight. R: Patient remains safe on the unit.  Patient did not attend group tonight.  Patient only visible on hte unit one time and it was for medications tonight.  Patient taking administered medications.
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
## 2015-05-24 MED ORDER — BUPRENORPHINE HCL 2 MG SL SUBL
SUBLINGUAL_TABLET | SUBLINGUAL | Status: AC
  Filled 2015-05-24: qty 2

## 2015-05-24 NOTE — Progress Notes (Signed)
[facility] Group Notes:  (Nursing/MHT/Case Management/Adjunct)  Date:  05/24/2015  Time:  2100  Type of Therapy:  wrap up group  Participation Level:  Active  Participation Quality:  Appropriate, Attentive, Sharing and Supportive  Affect:  Appropriate  Cognitive:  Appropriate  Insight:  Lacking  Engagement in Group:  Engaged  Modes of Intervention:  Clarification, Education and Support  Summary of Progress/Problems: Pt shared that she is feeling much better today as today was the first day she got out of bed. Pt reported a relapse and a need to get back on Suboxone. Pt shares that her husband is now joining her in the suboxone treatment which will help prevent a future relapse.   Winfield Rast S 05/24/2015, 10:03 PM

## 2015-05-24 NOTE — BHH Group Notes (Signed)
[facility] Group Notes:  (Nursing/MHT/Case Management/Adjunct)  Date:  05/24/2015  Time:  10:45 AM  Type of Therapy:  Psychoeducational Skills  Participation Level:  Did Not Attend  Participation Quality:  N/A  Affect:  N/A  Cognitive:  N/A  Insight:  None  Engagement in Group:  None  Modes of Intervention:  Discussion and Education  Summary of Progress/Problems: Patient was invited to group but chose not to attend.   Dalilah Curlin E

## 2015-05-24 NOTE — Progress Notes (Signed)
Patient ID: [name] [name], female   DOB: [birth year], 35 y.o.   MRN: [id number]  DAR: Pt. Denies SI/HI and A/V Hallucinations. She reports sleep is poor, energy level is low, appetite is poor, and concentration is poor. She rates depression 9/10, hopelessness 8/10, and anxiety 9/10. She was in bed throughout most of the day but did get up this evening and received medications at the medication window, went to cafeteria for dinner, and sat in the dayroom after the meal which she has not previously done. Patient does report pain in her lower back and is receiving PRN pain medication. Support and encouragement provided to the patient. Scheduled medications administered to patient per physician's orders. Patient remains with anxious affect and mood. Q15 minute checks are maintained for safety.

## 2015-05-24 NOTE — BHH Group Notes (Signed)
[facility] LCSW Group Therapy  05/24/2015 4:29 PM  Type of Therapy:  Group Therapy  Participation Level:  Did Not Attend-pt invited. DID NOT ATTEND. Pt chose to remain in bed.   Summary of Progress/Problems: Today's Topic: Overcoming Obstacles. Patients identified one short term goal and potential obstacles in reaching this goal. Patients processed barriers involved in overcoming these obstacles. Patients identified steps necessary for overcoming these obstacles and explored motivation (internal and external) for facing these difficulties head on.   Smart, [name] Hove LCSW 05/24/2015, 4:29 PM

## 2015-05-24 NOTE — Progress Notes (Signed)
[facility] MD Progress Note  05/24/2015 5:43 PM [name] [name]  MRN:  [id number] Subjective:  [name] [name] states that she is not feeling as sick as she was feeling yesterday. She is back on her Suboxone.tolerating it well. She states that at home she was using 8 MG BID and 4 mg HS. She states she wants to continue to work on getting her life back together  Principal Problem: Opioid use disorder, severe, dependence ([place]) Diagnosis:   Patient Active Problem List   Diagnosis Date Noted  . Major depression ([place]) [F32.9] 01/05/2012    Priority: High  . Opioid dependence ([place]) [F11.20] 12/29/2011    Priority: High  . Back pain [M54.9] 12/29/2011    Priority: High  . MDD (major depressive disorder) ([place]) [F32.9] 05/22/2015  . MDD (major depressive disorder), recurrent severe, without psychosis ([place]) [F33.2] 09/22/2014  . Opioid use disorder, severe, dependence ([place]) [F11.20] 09/22/2014  . Hypokalemia [E87.6] 09/22/2014  . Tylenol overdose [T39.1X4A] 09/19/2014  . Suicide attempt ([place]) [T14.91] 09/19/2014  . GERD (gastroesophageal reflux disease) [K21.9] 09/19/2014  . Drug dependence ([place]) [F19.20]   . Polysubstance (including opioids) dependence with physiological dependence ([place]) [F19.20] 07/08/2013  . Suicidal ideation [R45.851] 07/06/2013  . Nausea & vomiting [R11.2] 07/06/2013  . Chronic pain disorder [G89.4] 07/06/2013  . Cocaine abuse [F14.10] 07/06/2013  . Alcohol dependence ([place]) [F10.20] 01/04/2012  . N&V (nausea and vomiting) [R11.2] 06/21/2011  . Tachycardia [R00.0] 06/21/2011  . Abdominal pain [R10.9] 06/21/2011  . Leukocytosis [D72.829] 06/21/2011   Total Time spent with patient: 20 minutes  Past Psychiatric History: see admission H and P Past Medical History:  Past Medical History  Diagnosis Date  . Migraine   . GERD (gastroesophageal reflux disease)     protonix evenings  . Endometriosis     chronic pain  . Anxiety     klonipin for stress disorder  . Degenerative disc disease    . Chronic back pain     Past Surgical History  Procedure Laterality Date  . Appendectomy    . Tonsillectomy    . Tubal ligation    . Laparoscopic assisted vaginal hysterectomy  08/19/2011    Procedure: LAPAROSCOPIC ASSISTED VAGINAL HYSTERECTOMY;  Surgeon: Gus Height, MD;  Location: [facility];  Service: Gynecology;  Laterality: N/A;  . Abdominal hysterectomy     Family History:  Family History  Problem Relation Age of Onset  . Coronary artery disease Father   . Coronary artery disease Maternal Grandmother   . Breast cancer Mother   . Cancer Mother    Family Psychiatric  History: see admission H and P Social History:  History  Alcohol Use No     History  Drug Use  . Yes  . Special: Heroin    Comment: denies    Social History   Social History  . Marital Status: Married    Spouse Name: N/A  . Number of Children: N/A  . Years of Education: N/A   Social History Main Topics  . Smoking status: Current Every Day Smoker -- 1.00 packs/day for 14 years    Types: Cigarettes  . Smokeless tobacco: None  . Alcohol Use: No  . Drug Use: Yes    Special: Heroin     Comment: denies  . Sexual Activity: Yes   Other Topics Concern  . None   Social History Narrative   Additional Social History:    Pain Medications: yes pt states she has taken oxycontin 60 mg bid and opana q8 hours  in the past; patient taking suboxone for the past year Prescriptions: suboxone Over the Counter: none History of alcohol / drug use?: Yes Longest period of sobriety (when/how long): pt denies alcohol and / or other drug abuse " on over 10 yrs" Negative Consequences of Use: Legal Withdrawal Symptoms: Anorexia, Fever / Chills, Tremors Name of Substance 1: Suboxone  1 - Age of First Use: 34 1 - Amount (size/oz): "Almost 1 year"; 32 milligrams daily  1 - Frequency: daily for almost 1 year  1 - Duration: "on-going for 1 year" 1 - Last Use / Amount: 05/21/15 Name of Substance 2: Opiates "I'm sure exactly  what kind but it was crushed up"; Pt suspects it was crused up Heroin and Fentanly. (relapsed 1-2 days ago) 2 - Age of First Use: 20's 2 - Amount (size/oz): unk 2 - Frequency: daily  2 - Duration: 1 day; relapsed 2 - Last Use / Amount: Started a month ago                Sleep: Fair  Appetite:  Fair  Current Medications: Current Facility-Administered Medications  Medication Dose Route Frequency Provider Last Rate Last Dose  . acetaminophen (TYLENOL) tablet 650 mg  650 mg Oral Q6H PRN Laverle Hobby, PA-C   650 mg at 05/24/15 1317  . buprenorphine (SUBUTEX) 2 MG SL tablet           . buprenorphine (SUBUTEX) sublingual tablet 8 mg  8 mg Sublingual QAC supper Nicholaus Bloom, MD   8 mg at 05/24/15 1702   And  . buprenorphine (SUBUTEX) sublingual tablet 12 mg  12 mg Sublingual Daily Nicholaus Bloom, MD   12 mg at 05/24/15 0816  . dicyclomine (BENTYL) tablet 20 mg  20 mg Oral Q6H PRN Laverle Hobby, PA-C   20 mg at 05/23/15 0827  . hydrOXYzine (ATARAX/VISTARIL) tablet 25 mg  25 mg Oral Q6H PRN Laverle Hobby, PA-C   25 mg at 05/24/15 1317  . loperamide (IMODIUM) capsule 2-4 mg  2-4 mg Oral PRN Laverle Hobby, PA-C      . magnesium hydroxide (MILK OF MAGNESIA) suspension 30 mL  30 mL Oral Daily PRN Laverle Hobby, PA-C      . methocarbamol (ROBAXIN) tablet 500 mg  500 mg Oral Q8H PRN Laverle Hobby, PA-C   500 mg at 05/23/15 2039  . nicotine (NICODERM CQ - dosed in mg/24 hours) patch 21 mg  21 mg Transdermal Q0600 Laverle Hobby, PA-C   21 mg at 05/24/15 0818  . traZODone (DESYREL) tablet 50 mg  50 mg Oral QHS PRN,MR X 1 Laverle Hobby, PA-C   50 mg at 05/23/15 2219    Lab Results: No results found for this or any previous visit (from the past 48 hour(s)).  Blood Alcohol level:  Lab Results  Component Value Date   ETH <5 05/22/2015   ETH <5 08/13/2014    Physical Findings: AIMS: Facial and Oral Movements Muscles of Facial Expression: None, normal Lips and Perioral Area:  None, normal Jaw: None, normal Tongue: None, normal,Extremity Movements Upper (arms, wrists, hands, fingers): None, normal Lower (legs, knees, ankles, toes): None, normal, Trunk Movements Neck, shoulders, hips: None, normal, Overall Severity Severity of abnormal movements (highest score from questions above): None, normal Incapacitation due to abnormal movements: None, normal Patient's awareness of abnormal movements (rate only patient's report): No Awareness, Dental Status Current problems with teeth and/or dentures?: No Does patient usually wear  dentures?: No  CIWA:    COWS:  COWS Total Score: 10  Musculoskeletal: Strength & Muscle Tone: within normal limits Gait & Station: normal Patient leans: normal  Psychiatric Specialty Exam: Review of Systems  Constitutional: Positive for malaise/fatigue.  HENT: Negative.   Eyes: Negative.   Respiratory: Negative.   Cardiovascular: Negative.   Gastrointestinal: Negative.   Genitourinary: Negative.   Musculoskeletal: Negative.   Skin: Negative.   Neurological: Positive for weakness.  Endo/Heme/Allergies: Negative.   Psychiatric/Behavioral: Positive for depression and substance abuse. The patient is nervous/anxious.     Blood pressure 98/71, pulse 101, temperature 97.9 F (36.6 C), temperature source Oral, resp. rate 20, height 5\' 3"  (1.6 m), weight 92.08 kg (203 lb), last menstrual period 08/10/2011, SpO2 99 %.Body mass index is 35.97 kg/(m^2).  General Appearance: Fairly Groomed  Engineer, water::  Fair  Speech:  Clear and Coherent  Volume:  Decreased  Mood:  Anxious and Depressed  Affect:  Restricted  Thought Process:  Coherent and Goal Directed  Orientation:  Full (Time, Place, and Person)  Thought Content:  symptoms events worries concerns  Suicidal Thoughts:  No  Homicidal Thoughts:  No  Memory:  Immediate;   Fair Recent;   Fair Remote;   Fair  Judgement:  Fair  Insight:  Present  Psychomotor Activity:  Restlessness   Concentration:  Fair  Recall:  [employer] of Knowledge:Fair  Language: Fair  Akathisia:  No  Handed:  Right  AIMS (if indicated):     Assets:  Desire for Improvement Housing Social Support  ADL's:  Intact  Cognition: WNL  Sleep:  Number of Hours: 6.75   Treatment Plan Summary: Daily contact with patient to assess and evaluate symptoms and progress in treatment and Medication management Supportive approach/coping skills Opioid dependence; continue the Subutex and optimize response Work a relapse prevention plan  Depression; reassess for the need for an antidepressant Work with CBT/mindfulness Uchechi Denison A, MD 05/24/2015, 5:43 PM

## 2015-05-25 MED ORDER — BUPRENORPHINE HCL 2 MG SL SUBL: 4.0000 mg | SUBLINGUAL_TABLET | Freq: Every day | SUBLINGUAL | Status: DC

## 2015-05-25 MED ORDER — BUPRENORPHINE HCL 8 MG SL SUBL
8.0000 mg | SUBLINGUAL_TABLET | Freq: Every day | SUBLINGUAL | Status: AC
Start: 2015-05-25 — End: 2015-05-25
  Administered 2015-05-25: 8 mg via SUBLINGUAL
  Filled 2015-05-25: qty 1

## 2015-05-25 MED ORDER — BUPRENORPHINE HCL 8 MG SL SUBL
8.0000 mg | SUBLINGUAL_TABLET | Freq: Two times a day (BID) | SUBLINGUAL | Status: DC
  Administered 2015-05-26 – 2015-05-28 (×5): 8 mg via SUBLINGUAL
  Filled 2015-05-25 (×7): qty 1

## 2015-05-25 MED ORDER — BUPRENORPHINE HCL 2 MG SL SUBL
4.0000 mg | SUBLINGUAL_TABLET | Freq: Every day | SUBLINGUAL | Status: DC
  Administered 2015-05-26 – 2015-05-27 (×2): 4 mg via SUBLINGUAL
  Filled 2015-05-25 (×2): qty 2

## 2015-05-25 MED ORDER — BUPRENORPHINE HCL 8 MG SL SUBL: 8.0000 mg | SUBLINGUAL_TABLET | Freq: Two times a day (BID) | SUBLINGUAL | Status: DC

## 2015-05-25 NOTE — Progress Notes (Signed)
Recreation Therapy Notes  Date: 05.05.2017 Time: 9:30am Location: 30 Hall Group Room   Group Topic: Stress Management  Goal Area(s) Addresses:  Patient will actively participate in stress management techniques presented during session.   Behavioral Response: Did not attend   Lane Hacker, LRT/CTRS        Lane Hacker 05/25/2015 9:58 AM

## 2015-05-25 NOTE — BHH Group Notes (Signed)
[facility] LCSW Group Therapy  05/25/2015 12:43 PM  Type of Therapy:  Group Therapy  Participation Level:  Minimal  Participation Quality:  Attentive  Affect:  Appropriate  Cognitive:  Alert  Insight:  Improving  Engagement in Therapy:  Improving  Modes of Intervention:  Confrontation, Discussion, Education, Exploration, Problem-solving, Rapport Building, Socialization and Support  Summary of Progress/Problems: Feelings around Relapse. Group members discussed the meaning of relapse and shared personal stories of relapse, how it affected them and others, and how they perceived themselves during this time. Group members were encouraged to identify triggers, warning signs and coping skills used when facing the possibility of relapse. Social supports were discussed and explored in detail. Post Acute Withdrawal Syndrome (handout provided) was introduced and examined. Pt's were encouraged to ask questions, talk about key points associated with PAWS, and process this information in terms of relapse prevention.  [name] [name] was attentive and engaged during today's processing group. She listened as others read the PAWS handout and shared that she needs to share this information with her family and come up with a safety plan. She continues to show progress in the group setting with improving insight.    Smart, [name] LCSW 05/25/2015, 12:43 PM

## 2015-05-25 NOTE — Progress Notes (Signed)
Per pt request, CSW faxed hospital note to [facility] clerk of court [phone]) and District Attorney's office [phone]) regarding her court date on Monday morning 05/28/15. Original and fax confirmation provided to pt for her records.  Maxie Better, MSW, LCSW Clinical Social Worker 05/25/2015 4:26 PM

## 2015-05-25 NOTE — Plan of Care (Signed)
Problem: Alteration in mood & ability to function due to Goal: STG-Patient will attend groups Outcome: Progressing Pt attended and engaged in evening wrap up group

## 2015-05-25 NOTE — BHH Group Notes (Signed)
[facility] LCSW Aftercare Discharge Planning Group Note   05/25/2015 9:34 AM  Participation Quality:  Invited. DID NOT ATTEND. Pt chose to remain in bed.   Smart, [name] Liew LCSW

## 2015-05-25 NOTE — BHH Suicide Risk Assessment (Signed)
[place] INPATIENT:  Family/Significant Other Suicide Prevention Education  Suicide Prevention Education:  Patient Refusal for Family/Significant Other Suicide Prevention Education: The patient [name] [name] has refused to provide written consent for family/significant other to be provided Family/Significant Other Suicide Prevention Education during admission and/or prior to discharge.  Physician notified.  SPE completed with pt, as pt refused to consent to family contact. SPI pamphlet provided to pt and pt was encouraged to share information with support network, ask questions, and talk about any concerns relating to SPE. Pt denies access to guns/firearms and verbalized understanding of information provided. Mobile Crisis information also provided to pt.   Smart, Katyra Tomassetti LCSW 05/25/2015, 2:40 PM

## 2015-05-25 NOTE — Progress Notes (Signed)
[facility] MD Progress Note  05/25/2015 3:54 PM [name] [name]  MRN:  [id number] Subjective:  [name] [name] states that she is not feeling as sick as when she came in. . She is back on her Suboxone.tolerating it well. She states that at home she was using 8 MG BID and 4 mg HS. She states she wants to continue to work on getting her life back together. Wants to be stronger before she gets out of here. States her husband is supportive Principal Problem: Opioid use disorder, severe, dependence ([place]) Diagnosis:   Patient Active Problem List   Diagnosis Date Noted  . Major depression ([place]) [F32.9] 01/05/2012    Priority: High  . Opioid dependence ([place]) [F11.20] 12/29/2011    Priority: High  . Back pain [M54.9] 12/29/2011    Priority: High  . MDD (major depressive disorder) ([place]) [F32.9] 05/22/2015  . MDD (major depressive disorder), recurrent severe, without psychosis ([place]) [F33.2] 09/22/2014  . Opioid use disorder, severe, dependence ([place]) [F11.20] 09/22/2014  . Hypokalemia [E87.6] 09/22/2014  . Tylenol overdose [T39.1X4A] 09/19/2014  . Suicide attempt ([place]) [T14.91] 09/19/2014  . GERD (gastroesophageal reflux disease) [K21.9] 09/19/2014  . Drug dependence ([place]) [F19.20]   . Polysubstance (including opioids) dependence with physiological dependence ([place]) [F19.20] 07/08/2013  . Suicidal ideation [R45.851] 07/06/2013  . Nausea & vomiting [R11.2] 07/06/2013  . Chronic pain disorder [G89.4] 07/06/2013  . Cocaine abuse [F14.10] 07/06/2013  . Alcohol dependence ([place]) [F10.20] 01/04/2012  . N&V (nausea and vomiting) [R11.2] 06/21/2011  . Tachycardia [R00.0] 06/21/2011  . Abdominal pain [R10.9] 06/21/2011  . Leukocytosis [D72.829] 06/21/2011   Total Time spent with patient: 20 minutes  Past Psychiatric History: see admission H and P Past Medical History:  Past Medical History  Diagnosis Date  . Migraine   . GERD (gastroesophageal reflux disease)     protonix evenings  . Endometriosis     chronic pain   . Anxiety     klonipin for stress disorder  . Degenerative disc disease   . Chronic back pain     Past Surgical History  Procedure Laterality Date  . Appendectomy    . Tonsillectomy    . Tubal ligation    . Laparoscopic assisted vaginal hysterectomy  08/19/2011    Procedure: LAPAROSCOPIC ASSISTED VAGINAL HYSTERECTOMY;  Surgeon: Gus Height, MD;  Location: [facility];  Service: Gynecology;  Laterality: N/A;  . Abdominal hysterectomy     Family History:  Family History  Problem Relation Age of Onset  . Coronary artery disease Father   . Coronary artery disease Maternal Grandmother   . Breast cancer Mother   . Cancer Mother    Family Psychiatric  History: see admission H and P Social History:  History  Alcohol Use No     History  Drug Use  . Yes  . Special: Heroin    Comment: denies    Social History   Social History  . Marital Status: Married    Spouse Name: N/A  . Number of Children: N/A  . Years of Education: N/A   Social History Main Topics  . Smoking status: Current Every Day Smoker -- 1.00 packs/day for 14 years    Types: Cigarettes  . Smokeless tobacco: None  . Alcohol Use: No  . Drug Use: Yes    Special: Heroin     Comment: denies  . Sexual Activity: Yes   Other Topics Concern  . None   Social History Narrative   Additional Social History:    Pain  Medications: yes pt states she has taken oxycontin 60 mg bid and opana q8 hours in the past; patient taking suboxone for the past year Prescriptions: suboxone Over the Counter: none History of alcohol / drug use?: Yes Longest period of sobriety (when/how long): pt denies alcohol and / or other drug abuse " on over 10 yrs" Negative Consequences of Use: Legal Withdrawal Symptoms: Anorexia, Fever / Chills, Tremors Name of Substance 1: Suboxone  1 - Age of First Use: 34 1 - Amount (size/oz): "Almost 1 year"; 32 milligrams daily  1 - Frequency: daily for almost 1 year  1 - Duration: "on-going for 1 year" 1 -  Last Use / Amount: 05/21/15 Name of Substance 2: Opiates "I'm sure exactly what kind but it was crushed up"; Pt suspects it was crused up Heroin and Fentanly. (relapsed 1-2 days ago) 2 - Age of First Use: 20's 2 - Amount (size/oz): unk 2 - Frequency: daily  2 - Duration: 1 day; relapsed 2 - Last Use / Amount: Started a month ago                Sleep: Fair  Appetite:  Fair  Current Medications: Current Facility-Administered Medications  Medication Dose Route Frequency Provider Last Rate Last Dose  . acetaminophen (TYLENOL) tablet 650 mg  650 mg Oral Q6H PRN Laverle Hobby, PA-C   650 mg at 05/25/15 0850  . [START ON 05/26/2015] buprenorphine (SUBUTEX) SL tablet 4 mg  4 mg Sublingual QHS Nicholaus Bloom, MD      . buprenorphine (SUBUTEX) sublingual tablet 8 mg  8 mg Sublingual Daily Nicholaus Bloom, MD      . Derrill Memo ON 05/26/2015] buprenorphine (SUBUTEX) sublingual tablet 8 mg  8 mg Sublingual BID Nicholaus Bloom, MD      . dicyclomine (BENTYL) tablet 20 mg  20 mg Oral Q6H PRN Laverle Hobby, PA-C   20 mg at 05/23/15 0827  . hydrOXYzine (ATARAX/VISTARIL) tablet 25 mg  25 mg Oral Q6H PRN Laverle Hobby, PA-C   25 mg at 05/24/15 1317  . loperamide (IMODIUM) capsule 2-4 mg  2-4 mg Oral PRN Laverle Hobby, PA-C      . magnesium hydroxide (MILK OF MAGNESIA) suspension 30 mL  30 mL Oral Daily PRN Laverle Hobby, PA-C      . methocarbamol (ROBAXIN) tablet 500 mg  500 mg Oral Q8H PRN Laverle Hobby, PA-C   500 mg at 05/23/15 2039  . nicotine (NICODERM CQ - dosed in mg/24 hours) patch 21 mg  21 mg Transdermal Q0600 Laverle Hobby, PA-C   21 mg at 05/25/15 [id number]  . traZODone (DESYREL) tablet 50 mg  50 mg Oral QHS PRN,MR X 1 Spencer E Simon, PA-C   50 mg at 05/24/15 2304    Lab Results: No results found for this or any previous visit (from the past 48 hour(s)).  Blood Alcohol level:  Lab Results  Component Value Date   ETH <5 05/22/2015   ETH <5 08/13/2014    Physical Findings: AIMS: Facial  and Oral Movements Muscles of Facial Expression: None, normal Lips and Perioral Area: None, normal Jaw: None, normal Tongue: None, normal,Extremity Movements Upper (arms, wrists, hands, fingers): None, normal Lower (legs, knees, ankles, toes): None, normal, Trunk Movements Neck, shoulders, hips: None, normal, Overall Severity Severity of abnormal movements (highest score from questions above): None, normal Incapacitation due to abnormal movements: None, normal Patient's awareness of abnormal movements (rate only patient's report):  No Awareness, Dental Status Current problems with teeth and/or dentures?: No Does patient usually wear dentures?: No  CIWA:    COWS:  COWS Total Score: 10  Musculoskeletal: Strength & Muscle Tone: within normal limits Gait & Station: normal Patient leans: normal  Psychiatric Specialty Exam: Review of Systems  Constitutional: Positive for malaise/fatigue.  HENT: Negative.   Eyes: Negative.   Respiratory: Negative.   Cardiovascular: Negative.   Gastrointestinal: Positive for nausea.  Genitourinary: Negative.   Musculoskeletal: Positive for myalgias.  Skin: Negative.   Neurological: Negative.   Endo/Heme/Allergies: Negative.   Psychiatric/Behavioral: Positive for depression and substance abuse. The patient is nervous/anxious.     Blood pressure 112/62, pulse 97, temperature 98.4 F (36.9 C), temperature source Oral, resp. rate 16, height 5\' 3"  (1.6 m), weight 92.08 kg (203 lb), last menstrual period 08/10/2011, SpO2 99 %.Body mass index is 35.97 kg/(m^2).  General Appearance: Fairly Groomed  Engineer, water::  Fair  Speech:  Clear and Coherent  Volume:  Decreased  Mood:  Anxious and Depressed  Affect:  Restricted  Thought Process:  Coherent and Goal Directed  Orientation:  Full (Time, Place, and Person)  Thought Content:  symptoms events worries concerns  Suicidal Thoughts:  No  Homicidal Thoughts:  No  Memory:  Immediate;   Fair Recent;    Fair Remote;   Fair  Judgement:  Fair  Insight:  Present  Psychomotor Activity:  Restlessness  Concentration:  Fair  Recall:  [employer] of Knowledge:Fair  Language: Fair  Akathisia:  No  Handed:  Right  AIMS (if indicated):     Assets:  Desire for Improvement Housing Social Support  ADL's:  Intact  Cognition: WNL  Sleep:  Number of Hours: 6.25   Treatment Plan Summary: Daily contact with patient to assess and evaluate symptoms and progress in treatment and Medication management Supportive approach/coping skills Opioid dependence; continue the Subutex and divide the dose as she states she was using it at home and optimize response Work a relapse prevention plan  Depression; reassess for the need for an antidepressant (R/O drug related) Work with CBT/mindfulness Nicholaus Bloom, MD 05/25/2015, 3:54 PM

## 2015-05-25 NOTE — Progress Notes (Signed)
Patient ID: [name] [name], female   DOB: Apr 17, 1980, 35 y.o.   MRN: [id number] D: Patient mood and affect appeared depressed and flat. Pt reports tolerating medication well without any adverse effects. Denies  SI/HI/AVH and pain.No behavioral issues noted.  A: Support and encouragement offered as needed. Medications administered as prescribed. Will continue to monitor patient for safety and stability.  R: Patient is safe on the unit. Pt attended and engaged in evening wrap up group.

## 2015-05-25 NOTE — Progress Notes (Signed)
DAR NOTE: Pt present with flat affect and depressed mood in the unit. Pt has been seen interacting with peers in the milieu.  Pt denies physical pain, took all her meds as scheduled. Pt stated she  had a good night sleep, good appetite, normal energy, and poor concentration. Pt rate depression at 5, hopeless ness at 4, and anxiety at 5. Pt's safety ensured with 15 minute and environmental checks. Pt currently denies SI/HI and A/V hallucinations. Pt verbally agrees to seek staff if SI/HI or A/VH occurs and to consult with staff before acting on these thoughts. Will continue POC.

## 2015-05-26 MED ORDER — TRAZODONE HCL 100 MG PO TABS
100.0000 mg | ORAL_TABLET | Freq: Every evening | ORAL | Status: DC | PRN
  Administered 2015-05-26 – 2015-05-27 (×2): 100 mg via ORAL
  Filled 2015-05-26 (×2): qty 1

## 2015-05-26 MED ORDER — HYDROCORTISONE 1 % EX CREA
TOPICAL_CREAM | Freq: Three times a day (TID) | CUTANEOUS | Status: DC
  Administered 2015-05-26: 18:00:00 via TOPICAL
  Filled 2015-05-26 (×2): qty 28

## 2015-05-26 MED ORDER — HYDROCERIN EX CREA
TOPICAL_CREAM | Freq: Two times a day (BID) | CUTANEOUS | Status: DC
  Administered 2015-05-26: 18:00:00 via TOPICAL
  Filled 2015-05-26: qty 113

## 2015-05-26 NOTE — Progress Notes (Signed)
Patient did attend the evening speaker AA meeting.  

## 2015-05-26 NOTE — BHH Group Notes (Signed)
[facility] Group Notes: (Clinical Social Work)   05/26/2015      Type of Therapy:  Group Therapy   Participation Level:  Did Not Attend despite MHT prompting   Selmer Dominion, LCSW 05/26/2015, 12:32 PM

## 2015-05-26 NOTE — Progress Notes (Signed)
D: Patient observed in milieu interacting with peers. Patient states his goal for the day was to stay sober." A: Support and encouragement offered. Q 15 minute checks in progress and maintained for safety. R: Patient remains safe on unit.

## 2015-05-26 NOTE — Progress Notes (Signed)
[facility] MD Progress Note  05/26/2015 8:17 PM [name] [name]  MRN:  [id number] Subjective:  [name] [name] states that she is starting to feel better. . She is back on her Suboxone.tolerating it well She is back using 8 MG BID and 4 mg HS. She states she wants to continue to work on getting her life back together. Wants to be stronger before she gets out of here. States her husband is supportive.Did not sleep as well last night.  Principal Problem: Opioid use disorder, severe, dependence ([place]) Diagnosis:   Patient Active Problem List   Diagnosis Date Noted  . Major depression ([place]) [F32.9] 01/05/2012    Priority: High  . Opioid dependence ([place]) [F11.20] 12/29/2011    Priority: High  . Back pain [M54.9] 12/29/2011    Priority: High  . MDD (major depressive disorder) ([place]) [F32.9] 05/22/2015  . MDD (major depressive disorder), recurrent severe, without psychosis ([place]) [F33.2] 09/22/2014  . Opioid use disorder, severe, dependence ([place]) [F11.20] 09/22/2014  . Hypokalemia [E87.6] 09/22/2014  . Tylenol overdose [T39.1X4A] 09/19/2014  . Suicide attempt ([place]) [T14.91] 09/19/2014  . GERD (gastroesophageal reflux disease) [K21.9] 09/19/2014  . Drug dependence ([place]) [F19.20]   . Polysubstance (including opioids) dependence with physiological dependence ([place]) [F19.20] 07/08/2013  . Suicidal ideation [R45.851] 07/06/2013  . Nausea & vomiting [R11.2] 07/06/2013  . Chronic pain disorder [G89.4] 07/06/2013  . Cocaine abuse [F14.10] 07/06/2013  . Alcohol dependence ([place]) [F10.20] 01/04/2012  . N&V (nausea and vomiting) [R11.2] 06/21/2011  . Tachycardia [R00.0] 06/21/2011  . Abdominal pain [R10.9] 06/21/2011  . Leukocytosis [D72.829] 06/21/2011   Total Time spent with patient: 20 minutes  Past Psychiatric History: see admission H and P Past Medical History:  Past Medical History  Diagnosis Date  . Migraine   . GERD (gastroesophageal reflux disease)     protonix evenings  . Endometriosis     chronic pain   . Anxiety     klonipin for stress disorder  . Degenerative disc disease   . Chronic back pain     Past Surgical History  Procedure Laterality Date  . Appendectomy    . Tonsillectomy    . Tubal ligation    . Laparoscopic assisted vaginal hysterectomy  08/19/2011    Procedure: LAPAROSCOPIC ASSISTED VAGINAL HYSTERECTOMY;  Surgeon: Gus Height, MD;  Location: [facility];  Service: Gynecology;  Laterality: N/A;  . Abdominal hysterectomy     Family History:  Family History  Problem Relation Age of Onset  . Coronary artery disease Father   . Coronary artery disease Maternal Grandmother   . Breast cancer Mother   . Cancer Mother    Family Psychiatric  History: see admission H and P Social History:  History  Alcohol Use No     History  Drug Use  . Yes  . Special: Heroin    Comment: denies    Social History   Social History  . Marital Status: Married    Spouse Name: N/A  . Number of Children: N/A  . Years of Education: N/A   Social History Main Topics  . Smoking status: Current Every Day Smoker -- 1.00 packs/day for 14 years    Types: Cigarettes  . Smokeless tobacco: None  . Alcohol Use: No  . Drug Use: Yes    Special: Heroin     Comment: denies  . Sexual Activity: Yes   Other Topics Concern  . None   Social History Narrative   Additional Social History:    Pain Medications: yes  pt states she has taken oxycontin 60 mg bid and opana q8 hours in the past; patient taking suboxone for the past year Prescriptions: suboxone Over the Counter: none History of alcohol / drug use?: Yes Longest period of sobriety (when/how long): pt denies alcohol and / or other drug abuse " on over 10 yrs" Negative Consequences of Use: Legal Withdrawal Symptoms: Anorexia, Fever / Chills, Tremors Name of Substance 1: Suboxone  1 - Age of First Use: 34 1 - Amount (size/oz): "Almost 1 year"; 32 milligrams daily  1 - Frequency: daily for almost 1 year  1 - Duration: "on-going for 1 year" 1 -  Last Use / Amount: 05/21/15 Name of Substance 2: Opiates "I'm sure exactly what kind but it was crushed up"; Pt suspects it was crused up Heroin and Fentanly. (relapsed 1-2 days ago) 2 - Age of First Use: 20's 2 - Amount (size/oz): unk 2 - Frequency: daily  2 - Duration: 1 day; relapsed 2 - Last Use / Amount: Started a month ago                Sleep: Fair  Appetite:  Fair  Current Medications: Current Facility-Administered Medications  Medication Dose Route Frequency Provider Last Rate Last Dose  . acetaminophen (TYLENOL) tablet 650 mg  650 mg Oral Q6H PRN Laverle Hobby, PA-C   650 mg at 05/25/15 1722  . buprenorphine (SUBUTEX) SL tablet 4 mg  4 mg Sublingual QHS Nicholaus Bloom, MD      . buprenorphine (SUBUTEX) sublingual tablet 8 mg  8 mg Sublingual BID Nicholaus Bloom, MD   8 mg at 05/26/15 1704  . dicyclomine (BENTYL) tablet 20 mg  20 mg Oral Q6H PRN Laverle Hobby, PA-C   20 mg at 05/23/15 0827  . hydrocerin (EUCERIN) cream   Topical BID Kerrie Buffalo, NP      . hydrocortisone cream 1 %   Topical TID Kerrie Buffalo, NP      . hydrOXYzine (ATARAX/VISTARIL) tablet 25 mg  25 mg Oral Q6H PRN Laverle Hobby, PA-C   25 mg at 05/24/15 1317  . loperamide (IMODIUM) capsule 2-4 mg  2-4 mg Oral PRN Laverle Hobby, PA-C      . magnesium hydroxide (MILK OF MAGNESIA) suspension 30 mL  30 mL Oral Daily PRN Laverle Hobby, PA-C      . methocarbamol (ROBAXIN) tablet 500 mg  500 mg Oral Q8H PRN Laverle Hobby, PA-C   500 mg at 05/23/15 2039  . nicotine (NICODERM CQ - dosed in mg/24 hours) patch 21 mg  21 mg Transdermal Q0600 Laverle Hobby, PA-C   21 mg at 05/25/15 [id number]  . traZODone (DESYREL) tablet 100 mg  100 mg Oral QHS PRN,MR X 1 Nicholaus Bloom, MD        Lab Results: No results found for this or any previous visit (from the past 48 hour(s)).  Blood Alcohol level:  Lab Results  Component Value Date   ETH <5 05/22/2015   ETH <5 08/13/2014    Physical Findings: AIMS: Facial and  Oral Movements Muscles of Facial Expression: None, normal Lips and Perioral Area: None, normal Jaw: None, normal Tongue: None, normal,Extremity Movements Upper (arms, wrists, hands, fingers): None, normal Lower (legs, knees, ankles, toes): None, normal, Trunk Movements Neck, shoulders, hips: None, normal, Overall Severity Severity of abnormal movements (highest score from questions above): None, normal Incapacitation due to abnormal movements: None, normal Patient's awareness of abnormal movements (  rate only patient's report): No Awareness, Dental Status Current problems with teeth and/or dentures?: No Does patient usually wear dentures?: No  CIWA:    COWS:  COWS Total Score: 10  Musculoskeletal: Strength & Muscle Tone: within normal limits Gait & Station: normal Patient leans: normal  Psychiatric Specialty Exam: Review of Systems  Constitutional: Positive for malaise/fatigue.  HENT: Negative.   Eyes: Negative.   Respiratory: Negative.   Cardiovascular: Negative.   Gastrointestinal: Positive for nausea.  Genitourinary: Negative.   Musculoskeletal: Positive for myalgias.  Skin: Negative.   Neurological: Negative.   Endo/Heme/Allergies: Negative.   Psychiatric/Behavioral: Positive for depression and substance abuse. The patient is nervous/anxious.     Blood pressure 115/75, pulse 103, temperature 98.4 F (36.9 C), temperature source Oral, resp. rate 18, height 5\' 3"  (1.6 m), weight 92.08 kg (203 lb), last menstrual period 08/10/2011, SpO2 99 %.Body mass index is 35.97 kg/(m^2).  General Appearance: Fairly Groomed  Engineer, water::  Fair  Speech:  Clear and Coherent  Volume:  Decreased  Mood:  Anxious and Depressed  Affect:  Restricted  Thought Process:  Coherent and Goal Directed  Orientation:  Full (Time, Place, and Person)  Thought Content:  symptoms events worries concerns  Suicidal Thoughts:  No  Homicidal Thoughts:  No  Memory:  Immediate;   Fair Recent;    Fair Remote;   Fair  Judgement:  Fair  Insight:  Present  Psychomotor Activity:  Restlessness  Concentration:  Fair  Recall:  [employer] of Knowledge:Fair  Language: Fair  Akathisia:  No  Handed:  Right  AIMS (if indicated):     Assets:  Desire for Improvement Housing Social Support  ADL's:  Intact  Cognition: WNL  Sleep:  Number of Hours: 6.25   Treatment Plan Summary: Daily contact with patient to assess and evaluate symptoms and progress in treatment and Medication management Supportive approach/coping skills Opioid dependence; continue the Subutex as prescribed 8-8-4 as she states she was using it at home and optimize response Work a relapse prevention plan  Depression; reassess for the need for an antidepressant (R/O drug related) Insomnia: increase the Trazodone to 100 mg HS Work with CBT/mindfulness Jaivian Battaglini A, MD 05/26/2015, 8:17 PM

## 2015-05-26 NOTE — Progress Notes (Signed)
D:Pt was in bed this morning with minimal interaction. Pt was difficult to wake up for her morning medication. Pt is up this afternoon in the dayroom and interacting with her peers.  A:Offered support and 15 minute checks. R:Pt denies si and hi. Safety maintained on the unit.

## 2015-05-27 DIAGNOSIS — F332 Major depressive disorder, recurrent severe without psychotic features: Secondary | ICD-10-CM | POA: Insufficient documentation

## 2015-05-27 MED ORDER — PANTOPRAZOLE SODIUM 20 MG PO TBEC
20.0000 mg | DELAYED_RELEASE_TABLET | Freq: Every day | ORAL | Status: DC
  Administered 2015-05-27 – 2015-05-28 (×2): 20 mg via ORAL
  Filled 2015-05-27 (×5): qty 1

## 2015-05-27 NOTE — Plan of Care (Signed)
Problem: Alteration in mood Goal: STG-Patient reports thoughts of self-harm to staff Outcome: Progressing [name] [name] denies SI to writer during shift assessment and on her daily inventory sheet.

## 2015-05-27 NOTE — Progress Notes (Signed)
Patient did attend the evening speaker AA meeting.  

## 2015-05-27 NOTE — Progress Notes (Signed)
Patient ID: [name] [name], female   DOB: 01/16/1981, 35 y.o.   MRN: [id number]  DAR: Pt. Denies SI/HI and A/V Hallucinations. She remains with blunted affect and depressed mood. She reports sleep is fair, appetite is fair, energy level is normal, and concentration is good. She rates depression 5/10, hopelessness 3/10, and anxiety 5/10. Patient reports a headache and received PRN Tylenol which provided some relief. Support and encouragement provided to the patient. Scheduled medications administered to patient per physician's orders however late this morning due to patient refusing to get out of bed to take them. Patient is seen in the bed throughout the morning but after taking her medications she is seen in the dayroom playing cards and interacting with her peers. She reports her goal for the day is to communicate with her husband instead of "avoiding the situation." Plan of care is continued. Q15 minute checks are maintained for safety.

## 2015-05-27 NOTE — BHH Group Notes (Signed)
[facility] Group Notes: (Clinical Social Work)   05/27/2015      Type of Therapy:  Group Therapy   Participation Level:  Did Not Attend despite MHT prompting   Selmer Dominion, LCSW 05/27/2015, 12:21 PM

## 2015-05-27 NOTE — BHH Group Notes (Signed)
[facility] Group Notes:  (Nursing/MHT/Case Management/Adjunct)  Date:  05/27/2015  Time:  4:22 PM  Type of Therapy:  Life Skills Group  Participation Level:  Minimal  Participation Quality:  Appropriate  Affect:  Anxious  Cognitive:  Appropriate  Insight:  Appropriate  Engagement in Group:  Engaged  Modes of Intervention:  Activity, Discussion and Education  Summary of Progress/Problems: Dorlisa attended group and was engaged.   Gaylan Gerold E 05/27/2015, 4:22 PM

## 2015-05-27 NOTE — Progress Notes (Signed)
[facility] MD Progress Note  05/27/2015 8:45 PM [name] [name]  MRN:  [id number] Subjective:  [name] [name] states that she is starting to feel better. . She is back on her Suboxone.tolerating it well She is back using 8 MG BID and 4 mg HS. She states she wants to continue to work on getting her life back together. She is also encouraged by the fact her husband is also seeking treatment a the [facility]. Things are falling in place and she does not want to mess them up Principal Problem: Opioid use disorder, severe, dependence ([place]) Diagnosis:   Patient Active Problem List   Diagnosis Date Noted  . Major depression ([place]) [F32.9] 01/05/2012    Priority: High  . Opioid dependence ([place]) [F11.20] 12/29/2011    Priority: High  . Back pain [M54.9] 12/29/2011    Priority: High  . MDD (major depressive disorder) ([name] [name] [name] [name]) [F32.9] 05/22/2015  . MDD (major depressive disorder), recurrent severe, without psychosis ([name] [name]) [F33.2] 09/22/2014  . Opioid use disorder, severe, dependence ([name] [name]) [F11.20] 09/22/2014  . Hypokalemia [E87.6] 09/22/2014  . Tylenol overdose [T39.1X4A] 09/19/2014  . Suicide attempt ([place]) [T14.91] 09/19/2014  . GERD (gastroesophageal reflux disease) [K21.9] 09/19/2014  . Drug dependence ([name] [name]) [F19.20]   . Polysubstance (including opioids) dependence with physiological dependence ([name] [name]) [F19.20] 07/08/2013  . Suicidal ideation [R45.851] 07/06/2013  . Nausea & vomiting [R11.2] 07/06/2013  . Chronic pain disorder [G89.4] 07/06/2013  . Cocaine abuse [F14.10] 07/06/2013  . Alcohol dependence ([name] [name]) [F10.20] 01/04/2012  . N&V (nausea and vomiting) [R11.2] 06/21/2011  . Tachycardia [R00.0] 06/21/2011  . Abdominal pain [R10.9] 06/21/2011  . Leukocytosis [D72.829] 06/21/2011   Total Time spent with patient: 20 minutes  Past Psychiatric History: see admission H and P Past Medical History:  Past Medical History  Diagnosis Date  . Migraine   . GERD (gastroesophageal reflux disease)    protonix evenings  . Endometriosis     chronic pain  . Anxiety     klonipin for stress disorder  . Degenerative disc disease   . Chronic back pain     Past Surgical History  Procedure Laterality Date  . Appendectomy    . Tonsillectomy    . Tubal ligation    . Laparoscopic assisted vaginal hysterectomy  08/19/2011    Procedure: LAPAROSCOPIC ASSISTED VAGINAL HYSTERECTOMY;  Surgeon: Gus Height, MD;  Location: [facility];  Service: Gynecology;  Laterality: N/A;  . Abdominal hysterectomy     Family History:  Family History  Problem Relation Age of Onset  . Coronary artery disease Father   . Coronary artery disease Maternal Grandmother   . Breast cancer Mother   . Cancer Mother    Family Psychiatric  History: see admission H and P Social History:  History  Alcohol Use No     History  Drug Use  . Yes  . Special: Heroin    Comment: denies    Social History   Social History  . Marital Status: Married    Spouse Name: N/A  . Number of Children: N/A  . Years of Education: N/A   Social History Main Topics  . Smoking status: Current Every Day Smoker -- 1.00 packs/day for 14 years    Types: Cigarettes  . Smokeless tobacco: None  . Alcohol Use: No  . Drug Use: Yes    Special: Heroin     Comment: denies  . Sexual Activity: Yes   Other Topics Concern  . None   Social History Narrative  Additional Social History:    Pain Medications: yes pt states she has taken oxycontin 60 mg bid and opana q8 hours in the past; patient taking suboxone for the past year Prescriptions: suboxone Over the Counter: none History of alcohol / drug use?: Yes Longest period of sobriety (when/how long): pt denies alcohol and / or other drug abuse " on over 10 yrs" Negative Consequences of Use: Legal Withdrawal Symptoms: Anorexia, Fever / Chills, Tremors Name of Substance 1: Suboxone  1 - Age of First Use: 34 1 - Amount (size/oz): "Almost 1 year"; 32 milligrams daily  1 - Frequency: daily for  almost 1 year  1 - Duration: "on-going for 1 year" 1 - Last Use / Amount: 05/21/15 Name of Substance 2: Opiates "I'm sure exactly what kind but it was crushed up"; Pt suspects it was crused up Heroin and Fentanly. (relapsed 1-2 days ago) 2 - Age of First Use: 20's 2 - Amount (size/oz): unk 2 - Frequency: daily  2 - Duration: 1 day; relapsed 2 - Last Use / Amount: Started a month ago                Sleep: Fair  Appetite:  Fair  Current Medications: Current Facility-Administered Medications  Medication Dose Route Frequency Provider Last Rate Last Dose  . acetaminophen (TYLENOL) tablet 650 mg  650 mg Oral Q6H PRN Laverle Hobby, PA-C   650 mg at 05/27/15 1955  . buprenorphine (SUBUTEX) SL tablet 4 mg  4 mg Sublingual QHS Nicholaus Bloom, MD   4 mg at 05/26/15 2240  . buprenorphine (SUBUTEX) sublingual tablet 8 mg  8 mg Sublingual BID Nicholaus Bloom, MD   8 mg at 05/27/15 1955  . dicyclomine (BENTYL) tablet 20 mg  20 mg Oral Q6H PRN Laverle Hobby, PA-C   20 mg at 05/23/15 0827  . hydrocerin (EUCERIN) cream   Topical BID Kerrie Buffalo, NP      . hydrocortisone cream 1 %   Topical TID Kerrie Buffalo, NP      . hydrOXYzine (ATARAX/VISTARIL) tablet 25 mg  25 mg Oral Q6H PRN Laverle Hobby, PA-C   25 mg at 05/24/15 1317  . loperamide (IMODIUM) capsule 2-4 mg  2-4 mg Oral PRN Laverle Hobby, PA-C      . magnesium hydroxide (MILK OF MAGNESIA) suspension 30 mL  30 mL Oral Daily PRN Laverle Hobby, PA-C      . methocarbamol (ROBAXIN) tablet 500 mg  500 mg Oral Q8H PRN Laverle Hobby, PA-C   500 mg at 05/23/15 2039  . nicotine (NICODERM CQ - dosed in mg/24 hours) patch 21 mg  21 mg Transdermal Q0600 Laverle Hobby, PA-C   21 mg at 05/25/15 [id number]  . pantoprazole (PROTONIX) EC tablet 20 mg  20 mg Oral Daily Shuvon B Rankin, NP   20 mg at 05/27/15 1211  . traZODone (DESYREL) tablet 100 mg  100 mg Oral QHS PRN,MR X 1 Nicholaus Bloom, MD   100 mg at 05/26/15 2240    Lab Results: No results found  for this or any previous visit (from the past 48 hour(s)).  Blood Alcohol level:  Lab Results  Component Value Date   ETH <5 05/22/2015   ETH <5 08/13/2014    Physical Findings: AIMS: Facial and Oral Movements Muscles of Facial Expression: None, normal Lips and Perioral Area: None, normal Jaw: None, normal Tongue: None, normal,Extremity Movements Upper (arms, wrists, hands, fingers): None, normal  Lower (legs, knees, ankles, toes): None, normal, Trunk Movements Neck, shoulders, hips: None, normal, Overall Severity Severity of abnormal movements (highest score from questions above): None, normal Incapacitation due to abnormal movements: None, normal Patient's awareness of abnormal movements (rate only patient's report): No Awareness, Dental Status Current problems with teeth and/or dentures?: No Does patient usually wear dentures?: No  CIWA:    COWS:  COWS Total Score: 10  Musculoskeletal: Strength & Muscle Tone: within normal limits Gait & Station: normal Patient leans: normal  Psychiatric Specialty Exam: Review of Systems  Constitutional: Positive for malaise/fatigue.  HENT: Negative.   Eyes: Negative.   Respiratory: Negative.   Cardiovascular: Negative.   Gastrointestinal: Positive for nausea.  Genitourinary: Negative.   Musculoskeletal: Positive for myalgias.  Skin: Negative.   Neurological: Negative.   Endo/Heme/Allergies: Negative.   Psychiatric/Behavioral: Positive for depression and substance abuse. The patient is nervous/anxious.     Blood pressure 110/69, pulse 100, temperature 98.7 F (37.1 C), temperature source Oral, resp. rate 18, height 5\' 3"  (1.6 m), weight 92.08 kg (203 lb), last menstrual period 08/10/2011, SpO2 99 %.Body mass index is 35.97 kg/(m^2).  General Appearance: Fairly Groomed  Engineer, water::  Fair  Speech:  Clear and Coherent  Volume:  Decreased  Mood:  Anxious and Depressed  Affect:  Restricted  Thought Process:  Coherent and Goal  Directed  Orientation:  Full (Time, Place, and Person)  Thought Content:  symptoms events worries concerns  Suicidal Thoughts:  No  Homicidal Thoughts:  No  Memory:  Immediate;   Fair Recent;   Fair Remote;   Fair  Judgement:  Fair  Insight:  Present  Psychomotor Activity:  Restlessness  Concentration:  Fair  Recall:  [employer] of Knowledge:Fair  Language: Fair  Akathisia:  No  Handed:  Right  AIMS (if indicated):     Assets:  Desire for Improvement Housing Social Support  ADL's:  Intact  Cognition: WNL  Sleep:  Number of Hours: 6.25   Treatment Plan Summary: Daily contact with patient to assess and evaluate symptoms and progress in treatment and Medication management Supportive approach/coping skills Opioid dependence; continue the Subutex as prescribed 8-8-4 as she states she was using it at home and optimize response Work a relapse prevention plan  Depression; her mood is improving as she is back on the Suboxone Insomnia: slept better with the increase in Trazodone to 100 mg HS Work with CBT/mindfulness Harrel Ferrone A, MD 05/27/2015, 8:45 PM

## 2015-05-27 NOTE — Progress Notes (Signed)
D   Pt is depressed and anxious   She is compliant with treatment  And interacts well with others A   Verbal support given    Medications administered and effectiveness monitored    Q 15 min checks R    Pt safe at present

## 2015-05-27 NOTE — [facility] Group Notes (Signed)
[facility] Group Notes:  (Nursing/MHT/Case Management/Adjunct)  Date:  05/27/2015  Time:  12:50 PM  Type of Therapy:  Psychoeducational Skills  Participation Level:  Did Not Attend  Participation Quality:  Did not attend'  Affect:  did not attend  Cognitive:  did not attend  Insight:  None  Engagement in Group:  None  Modes of Intervention:  did not attend  Summary of Progress/Problems: Patient was invited to group but did not attend.   Gaylan Gerold E 05/27/2015, 12:50 PM

## 2015-05-28 MED ORDER — HYDROCERIN EX CREA: 1.0000 "application " | TOPICAL_CREAM | Freq: Two times a day (BID) | CUTANEOUS | Status: DC

## 2015-05-28 MED ORDER — TRAZODONE HCL 100 MG PO TABS: 100.0000 mg | ORAL_TABLET | Freq: Every evening | ORAL | Status: DC | PRN

## 2015-05-28 MED ORDER — PANTOPRAZOLE SODIUM 20 MG PO TBEC: 20.0000 mg | DELAYED_RELEASE_TABLET | Freq: Every day | ORAL | Status: DC
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
## 2015-05-28 NOTE — Progress Notes (Signed)
  [facility] Adult Case Management Discharge Plan :  Will you be returning to the same living situation after discharge:  Yes,  home At discharge, do you have transportation home?: yes, boyfriend  Do you have the ability to pay for your medications: Yes,  [facility]  Release of information consent forms completed and submitted to medical records by CSW.  Patient to Follow up at: Follow-up Information    Follow up with [facility] On 05/29/2015.   Why:  Resume services on this date at 1:00PM. Thank you.    Contact information:   [address],  [address] Phone: [phone] Fax: [phone]      Next level of care provider has access to [place] and Suicide Prevention discussed: Yes,  SPE completed with pt, as she declined to consent to family contact. SPI pamphlet and mobile crisis information provided to pt and she was encouraged to share this with her support network, ask questions, and talk about any concerns relating to SPE.  Have you used any form of tobacco in the last 30 days? (Cigarettes, Smokeless Tobacco, Cigars, and/or Pipes): Yes  Has patient been referred to the Quitline?: Patient refused referral  Patient has been referred for addiction treatment: Yes  Smart, Carmelita Amparo LCSW 05/28/2015, 10:54 AM

## 2015-05-28 NOTE — Progress Notes (Signed)
Discharge note:  Patient discharged home per MD order.  Patient will follow [facility] with [facility]".  Reviewed AVS/discharge instructions with patient and she indicated understanding.  She denies SI/HI/AVH.  Patient was given prescriptions.  Patient understood that she will continue her suboxone from home from her supply.  Patient left ambulatory with her husband.

## 2015-05-28 NOTE — Discharge Summary (Signed)
Physician Discharge Summary Note  [name] [name]:  [name] [name] [name] is an 35 y.o., female MRN:  [id number] DOB:  16-Oct-1980 [name] [name] phone:  [phone] (home)  [name] [name] address:   [address] [facility] [address],  Total Time spent with [name] [name]: 30 minutes  Date of Admission:  05/22/2015 Date of Discharge: 05/28/2015  Reason for Admission: PER HPI-This is one of several admission assessments for this 35 year old Caucasian female. [name] [name] [name] in this hospital x multiple times all related to drug addiction & substance induced mood disorder. She is currently being admitted to the [facility] adult unit for opioid intoxication & withdrawal symptoms requiring detoxification treatments. During this assessment, [name] [name] [name], "My husband took me to the [facility] yesterday. I was having suicidal ideations & opioid withdrawal symptoms. I have had the suicidal thoughts for a couple of days. I was on Suboxen treatment for opioid addiction for 6 months. I really do not know what happened, but I relapsed on opioid about a month ago. I have been using heroin & other narcotic pain pills for about a month. I had stopped using my Suboxen for 3 days while using the other drugs, when I resumed, It made me very sick. I did not attempt to hurt myself this time, but have had 2 separate suicide attempts in the past by overdose on Tylenol. I want to resume my Suboxen treatment today. I'm going through bad withdrawals right now".   Principal Problem: Opioid use disorder, severe, dependence [facility]) Discharge Diagnoses: [name] [name] Active Problem List   Diagnosis Date Noted  . Severe episode of recurrent major depressive disorder, without psychotic features ([place]) [F33.2]   . MDD (major depressive disorder), recurrent severe, without psychosis ([place]) [F33.2] 09/22/2014  . Opioid use disorder, severe, dependence ([place]) [F11.20] 09/22/2014  . Hypokalemia [E87.6] 09/22/2014  . Tylenol overdose [T39.1X4A]  09/19/2014  . Suicide attempt ([place]) [T14.91] 09/19/2014  . GERD (gastroesophageal reflux disease) [K21.9] 09/19/2014  . Drug dependence ([place]) [F19.20]   . Polysubstance (including opioids) dependence with physiological dependence ([place]) [F19.20] 07/08/2013  . Suicidal ideation [R45.851] 07/06/2013  . Nausea & vomiting [R11.2] 07/06/2013  . Chronic pain disorder [G89.4] 07/06/2013  . Cocaine abuse [F14.10] 07/06/2013  . Major depression ([place]) [F32.9] 01/05/2012  . Alcohol dependence ([place]) [F10.20] 01/04/2012  . Opioid dependence ([place]) [F11.20] 12/29/2011  . Back pain [M54.9] 12/29/2011  . N&V (nausea and vomiting) [R11.2] 06/21/2011  . Tachycardia [R00.0] 06/21/2011  . Abdominal pain [R10.9] 06/21/2011  . Leukocytosis [D72.829] 06/21/2011    Past Psychiatric History: See Above  Past Medical History:  Past Medical History  Diagnosis Date  . Migraine   . GERD (gastroesophageal reflux disease)     protonix evenings  . Endometriosis     chronic pain  . Anxiety     klonipin for stress disorder  . Degenerative disc disease   . Chronic back pain     Past Surgical History  Procedure Laterality Date  . Appendectomy    . Tonsillectomy    . Tubal ligation    . Laparoscopic assisted vaginal hysterectomy  08/19/2011    Procedure: LAPAROSCOPIC ASSISTED VAGINAL HYSTERECTOMY;  Surgeon: Gus Height, MD;  Location: [facility];  Service: Gynecology;  Laterality: N/A;  . Abdominal hysterectomy     Family History:  Family History  Problem Relation Age of Onset  . Coronary artery disease Father   . Coronary artery disease Maternal Grandmother   . Breast cancer Mother   . Cancer Mother  Family Psychiatric  History: See Above Social History:  History  Alcohol Use No     History  Drug Use  . Yes  . Special: Heroin    Comment: denies    Social History   Social History  . Marital Status: Married    Spouse Name: N/A  . Number of Children: N/A  . Years of Education: N/A   Social  History Main Topics  . Smoking status: Current Every Day Smoker -- 1.00 packs/day for 14 years    Types: Cigarettes  . Smokeless tobacco: None  . Alcohol Use: No  . Drug Use: Yes    Special: Heroin     Comment: denies  . Sexual Activity: Yes   Other Topics Concern  . None   Social History Narrative    Hospital Course: [name] [name] [name] was admitted for Opioid use disorder, severe, dependence ([place]) and crisis management.  Pt was treated [name] [name] with the medications listed below under Medication List.  Medical problems were identified and treated as needed.  Home medications were restarted as appropriate.  Improvement was monitored by observation and [name] [name] [name] daily report of symptom reduction.  Emotional and mental status was monitored by daily self-inventory [name] [name] completed by [name] [name] [name].         [name] [name] [name] was evaluated by the treatment team for stability and plans for continued recovery upon discharge. [name] [name] [name] motivation was an integral factor for scheduling further treatment. Employment, transportation, bed availability, health status, family support, and any pending legal issues were also considered during hospital stay. Pt was offered further treatment options upon discharge including but not limited to Residential, Intensive Outpatient, and Outpatient treatment.  [name] [name] [name] will follow up with the services as listed below under Follow Up Information.     Upon completion of this admission the [name] [name] was both mentally and medically stable for discharge denying suicidal/homicidal ideation, auditory/visual/tactile hallucinations, delusional thoughts and paranoia.    [name] [name] [name] responded well to treatment with Protonix and Desyrel without adverse effects.  Pt demonstrated improvement without reported or observed adverse effects to the point of stability appropriate for outpatient management. Pertinent labs include:CMP for which outpatient  follow-up is necessary for lab recheck as mentioned below. Reviewed CBC, CMP, BAL, and UDS+ Opiates; all unremarkable aside from noted exceptions.   Physical Findings: AIMS: Facial and Oral Movements Muscles of Facial Expression: None, normal Lips and Perioral Area: None, normal Jaw: None, normal Tongue: None, normal,Extremity Movements Upper (arms, wrists, hands, fingers): None, normal Lower (legs, knees, ankles, toes): None, normal, Trunk Movements Neck, shoulders, hips: None, normal, Overall Severity Severity of abnormal movements (highest score from questions above): None, normal Incapacitation due to abnormal movements: None, normal TylerTyler awareness of abnormal movements (rate only TylerTyler report): No Awareness, Dental Status Current problems with teeth and/or dentures?: No Does [name] [name] usually wear dentures?: No  CIWA:    COWS:  COWS Total Score: 10  Musculoskeletal: Strength & Muscle Tone: within normal limits Gait & Station: normal [name] [name] leans: N/A  Psychiatric Specialty Exam: See SRA BY MD Review of Systems  Psychiatric/Behavioral: Negative for suicidal ideas and hallucinations. Depression: stable. Nervous/anxious: stable.   All other systems reviewed and are negative.   Blood pressure 105/66, pulse 92, temperature 97.7 F (36.5 C), temperature source Oral, resp. rate 18, height 5\' 3"  (1.6 m), weight 92.08 kg (203 lb), last menstrual period 08/10/2011, SpO2 99 %.Body mass index is 35.97 kg/(m^2).  Have you used any form  of tobacco in the last 30 days? (Cigarettes, Smokeless Tobacco, Cigars, and/or Pipes): Yes  Has this [name] [name] used any form of tobacco in the last 30 days? (Cigarettes, Smokeless Tobacco, Cigars, and/or Pipes) Yes, Yes, A prescription for an FDA-approved tobacco cessation medication was offered at discharge and the [name] [name] refused  Blood Alcohol level:  Lab Results  Component Value Date   [employer] <5 05/22/2015   ETH <5 [id number]    Metabolic  Disorder Labs:  Lab Results  Component Value Date   HGBA1C 5.4 12/29/2011   MPG 108 12/29/2011   MPG 111 06/22/2011   No results found for: PROLACTIN Lab Results  Component Value Date   CHOL 205* 12/29/2011   TRIG 269* 12/29/2011   HDL 28* 12/29/2011   CHOLHDL 7.3 12/29/2011   VLDL 54* 12/29/2011   LDLCALC 123* 12/29/2011    See Psychiatric Specialty Exam and Suicide Risk Assessment completed by Attending Physician prior to discharge.  Discharge destination:  Home  Is [name] [name] on multiple antipsychotic therapies at discharge:  No   Has [name] [name] had three or more failed trials of antipsychotic monotherapy by history:  No  Recommended Plan for Multiple Antipsychotic Therapies: NA  Discharge Instructions    Activity as tolerated - No restrictions    Complete by:  As directed      Diet general    Complete by:  As directed      Discharge instructions    Complete by:  As directed   Take all medications as prescribed. Keep all follow-up appointments as scheduled.  Do not consume alcohol or use illegal drugs while on prescription medications. Report any adverse effects from your medications to your primary care provider promptly.  In the event of recurrent symptoms or worsening symptoms, call 911, a crisis hotline, or go to the nearest emergency department for evaluation.            Medication List    STOP taking these medications        acetaminophen 500 MG tablet  Commonly known as:  TYLENOL     aspirin-acetaminophen-caffeine 250-250-65 MG tablet  Commonly known as:  EXCEDRIN MIGRAINE     cloNIDine 0.1 MG tablet  Commonly known as:  CATAPRES     DULoxetine 60 MG capsule  Commonly known as:  CYMBALTA     gabapentin 400 MG capsule  Commonly known as:  NEURONTIN     tiZANidine 2 MG tablet  Commonly known as:  ZANAFLEX      TAKE these medications      Indication   hydrocerin Crea  Apply 1 application topically 2 (two) times daily.   Indication:  Itching      pantoprazole 20 MG tablet  Commonly known as:  PROTONIX  Take 1 tablet (20 mg total) by mouth daily.   Indication:  Gastroesophageal Reflux Disease     SUBOXONE 8-2 MG Film  Generic drug:  Buprenorphine HCl-Naloxone HCl  Place 2.5 each under the tongue daily.      traZODone 100 MG tablet  Commonly known as:  DESYREL  Take 1 tablet (100 mg total) by mouth at bedtime as needed and may repeat dose one time if needed for sleep.   Indication:  Trouble Sleeping           Follow-up Information    Follow [facility] with [facility] On 05/29/2015.   Why:  Resume services on this date at 1:00PM. Thank you.    Contact information:   [address], [place] [address] Phone: [phone] Fax: [phone]      Follow-up recommendations:  Activity:  as tolerated Diet:  heart healthy  Comments:  Take all medications as prescribed. Keep all follow-up appointments as scheduled.  Do not consume alcohol or use illegal drugs while on prescription medications. Report any adverse effects from your medications to your primary care provider promptly.  In the event of recurrent symptoms or worsening symptoms, call 911, a crisis hotline, or go to the nearest emergency department for evaluation.   Signed: Derrill Center, NP 05/28/2015, 10:50 AM

## 2015-05-28 NOTE — Progress Notes (Signed)
D   Pt is depressed and anxious   She is compliant with treatment  And interacts well with others   She reports the doctor said she could go home tomorrow and she said she hopes to leave by noon   A   Verbal support given    Medications administered and effectiveness monitored  Explained the discharge process to patient and encouraged her to seek out her nurse to get things expedited   Q 15 min checks R    Pt safe at present and verbalized understanding

## 2015-05-28 NOTE — BHH Suicide Risk Assessment (Signed)
[facility] Discharge Suicide Risk Assessment   Principal Problem: Opioid use disorder, severe, dependence [facility]) Discharge Diagnoses:  Patient Active Problem List   Diagnosis Date Noted  . Severe episode of recurrent major depressive disorder, without psychotic features ([place]) [F33.2]   . MDD (major depressive disorder) ([place]) [F32.9] 05/22/2015  . MDD (major depressive disorder), recurrent severe, without psychosis ([place]) [F33.2] 09/22/2014  . Opioid use disorder, severe, dependence ([place]) [F11.20] 09/22/2014  . Hypokalemia [E87.6] 09/22/2014  . Tylenol overdose [T39.1X4A] 09/19/2014  . Suicide attempt ([place]) [T14.91] 09/19/2014  . GERD (gastroesophageal reflux disease) [K21.9] 09/19/2014  . Drug dependence ([place]) [F19.20]   . Polysubstance (including opioids) dependence with physiological dependence ([place]) [F19.20] 07/08/2013  . Suicidal ideation [R45.851] 07/06/2013  . Nausea & vomiting [R11.2] 07/06/2013  . Chronic pain disorder [G89.4] 07/06/2013  . Cocaine abuse [F14.10] 07/06/2013  . Major depression ([place]) [F32.9] 01/05/2012  . Alcohol dependence ([place]) [F10.20] 01/04/2012  . Opioid dependence ([place]) [F11.20] 12/29/2011  . Back pain [M54.9] 12/29/2011  . N&V (nausea and vomiting) [R11.2] 06/21/2011  . Tachycardia [R00.0] 06/21/2011  . Abdominal pain [R10.9] 06/21/2011  . Leukocytosis [D72.829] 06/21/2011    Total Time spent with patient: 30 minutes  Musculoskeletal: Strength & Muscle Tone: within normal limits Gait & Station: normal Patient leans: N/A  Psychiatric Specialty Exam: Review of Systems  Psychiatric/Behavioral: Positive for substance abuse. Negative for depression and hallucinations.  All other systems reviewed and are negative.   Blood pressure 105/66, pulse 92, temperature 97.7 F (36.5 C), temperature source Oral, resp. rate 18, height 5\' 3"  (1.6 m), weight 92.08 kg (203 lb), last menstrual period 08/10/2011, SpO2 99 %.Body mass index is 35.97 kg/(m^2).  General  Appearance: Fairly Groomed  Engineer, water::  Fair  Speech:  Clear and [id number]  Volume:  Normal  Mood:  Euthymic  Affect:  Congruent  Thought Process:  Goal Directed  Orientation:  Full (Time, Place, and Person)  Thought Content:  WDL  Suicidal Thoughts:  No  Homicidal Thoughts:  No  Memory:  Immediate;   Fair Recent;   Fair Remote;   Fair  Judgement:  Fair  Insight:  Fair  Psychomotor Activity:  Normal  Concentration:  Fair  Recall:  [employer] of Knowledge:Fair  Language: Fair  Akathisia:  No  Handed:  Right  AIMS (if indicated):     Assets:  Desire for Improvement  Sleep:  Number of Hours: 6.5  Cognition: WNL  ADL's:  Intact   Mental Status Per Nursing Assessment::   On Admission:  Self-harm thoughts  Demographic Factors:  Caucasian  Loss Factors: NA  Historical Factors: Impulsivity  Risk Reduction Factors:   Positive social support and Positive therapeutic relationship  Continued Clinical Symptoms:  Alcohol/Substance Abuse/Dependencies Previous Psychiatric Diagnoses and Treatments  Cognitive Features That Contribute To Risk:  None    Suicide Risk:  Minimal: No identifiable suicidal ideation.  Patients presenting with no risk factors but with morbid ruminations; may be classified as minimal risk based on the severity of the depressive symptoms  Follow-up Information    Follow up with [facility] On 05/29/2015.   Why:  Resume services on this date at 1:00PM. Thank you.    Contact information:   [address] [place] [address] Phone: [phone] Fax: [phone]      Plan Of Care/Follow-up recommendations:  Activity:  no restrictions Diet:  regular Tests:  as needed Other:  follow up with aftercare  Rito Lecomte, MD 05/28/2015, 9:48 AM

## 2015-05-28 NOTE — Progress Notes (Addendum)
D: Patient is being discharged today.  She got up late for her medications.  She presents with sad affect; anxious mood.  She states her boyfriend is picking her up.  Patient received her 0800 dose of subutex.  She denies SI/HI/AVH. A: Continue to monitor medication management and MD orders.  Safety checks completed every 15 minutes per protocol.  Offer support and encouragement as needed. R: Patient's behavior is appropriate.

## 2016-08-21 ENCOUNTER — Emergency Department (HOSPITAL_COMMUNITY)
Admission: EM | Admit: 2016-08-21 | Discharge: 2016-08-21 | Disposition: A | Discharge status: Home / Self Care | Payer: Medicaid Other | Attending: Emergency Medicine | Admitting: Emergency Medicine

## 2016-08-21 ENCOUNTER — Encounter (HOSPITAL_COMMUNITY): Payer: Self-pay | Admitting: Nurse Practitioner

## 2016-08-21 DIAGNOSIS — F17210 Nicotine dependence, cigarettes, uncomplicated: Secondary | ICD-10-CM | POA: Diagnosis not present

## 2016-08-21 DIAGNOSIS — M545 Low back pain, unspecified: Secondary | ICD-10-CM

## 2016-08-21 DIAGNOSIS — W19XXXA Unspecified fall, initial encounter: Secondary | ICD-10-CM

## 2016-08-21 DIAGNOSIS — M549 Dorsalgia, unspecified: Secondary | ICD-10-CM | POA: Diagnosis present

## 2016-08-21 MED ORDER — ACETAMINOPHEN 500 MG PO TABS
1000.0000 mg | ORAL_TABLET | Freq: Once | ORAL | Status: AC
  Administered 2016-08-21: 1000 mg via ORAL
  Filled 2016-08-21: qty 2

## 2016-08-21 MED ORDER — CYCLOBENZAPRINE HCL 10 MG PO TABS: 10.0000 mg | ORAL_TABLET | Freq: Every day | ORAL | 0 refills | Status: AC

## 2016-08-21 MED ORDER — NAPROXEN 375 MG PO TABS
375.0000 mg | ORAL_TABLET | Freq: Once | ORAL | Status: AC
  Administered 2016-08-21: 375 mg via ORAL
  Filled 2016-08-21: qty 1

## 2016-08-21 MED ORDER — METHOCARBAMOL 500 MG PO TABS
750.0000 mg | ORAL_TABLET | Freq: Once | ORAL | Status: AC
  Administered 2016-08-21: 750 mg via ORAL
  Filled 2016-08-21: qty 2

## 2016-08-21 NOTE — ED Notes (Signed)
Bed: [facility] Expected date:  Expected time:  Means of arrival:  Comments: EMS-fall-back pain

## 2016-08-21 NOTE — ED Provider Notes (Signed)
[facility] DEPT Provider Note   CSN: [id number] Arrival date & time: 08/21/16  1436     History   Chief Complaint No chief complaint on file.   HPI [name] [name] is a 36 y.o. female.  The history is provided by the patient.  Back Pain   Chronicity: acute on chronic. The current episode started 1 to 2 hours ago. The problem has not changed since onset.The pain is associated with falling. The pain is present in the lumbar spine and sacro-iliac joint. The pain radiates to the left thigh. The pain is moderate. The symptoms are aggravated by bending and certain positions. Pertinent negatives include no chest pain, no fever, no numbness, no abdominal swelling, no bowel incontinence, no perianal numbness, no bladder incontinence, no dysuria, no pelvic pain, no leg pain and no paresthesias. She has tried nothing for the symptoms.   Patient reports slipping on water, causing her to fall on her buttocks. This resulted in exacerbation of her chronic back pain.  Denies any other injuries including head, or loss of consciousness.   Past Medical History:  Diagnosis Date  . Anxiety    klonipin for stress disorder  . Chronic back pain   . Degenerative disc disease   . Endometriosis    chronic pain  . GERD (gastroesophageal reflux disease)    protonix evenings  . Migraine     Patient Active Problem List   Diagnosis Date Noted  . Severe episode of recurrent major depressive disorder, without psychotic features ([place])   . MDD (major depressive disorder), recurrent severe, without psychosis ([place]) 09/22/2014  . Opioid use disorder, severe, dependence ([place]) 09/22/2014  . Hypokalemia 09/22/2014  . Tylenol overdose 09/19/2014  . Suicide attempt ([place]) 09/19/2014  . GERD (gastroesophageal reflux disease) 09/19/2014  . Drug dependence ([place])   . Polysubstance (including opioids) dependence with physiological dependence ([place]) 07/08/2013  . Suicidal ideation 07/06/2013  . Nausea & vomiting  07/06/2013  . Chronic pain disorder 07/06/2013  . Cocaine abuse 07/06/2013  . Major depression 01/05/2012  . Alcohol dependence ([place]) 01/04/2012  . Opioid dependence ([place]) 12/29/2011  . Back pain 12/29/2011  . N&V (nausea and vomiting) 06/21/2011  . Tachycardia 06/21/2011  . Abdominal pain 06/21/2011  . Leukocytosis 06/21/2011    Past Surgical History:  Procedure Laterality Date  . ABDOMINAL HYSTERECTOMY    . APPENDECTOMY    . LAPAROSCOPIC ASSISTED VAGINAL HYSTERECTOMY  08/19/2011   Procedure: LAPAROSCOPIC ASSISTED VAGINAL HYSTERECTOMY;  Surgeon: Gus Height, MD;  Location: [facility];  Service: Gynecology;  Laterality: N/A;  . TONSILLECTOMY    . TUBAL LIGATION      OB History    Gravida Para Term Preterm AB Living   6 2           SAB TAB Ectopic Multiple Live Births                   Home Medications    Prior to Admission medications   Medication Sig Start Date End Date Taking? Authorizing Provider  Buprenorphine HCl-Naloxone HCl (SUBOXONE) 8-2 MG FILM Place 2.5 each under the tongue daily.    [provider]  cyclobenzaprine (FLEXERIL) 10 MG tablet Take 1 tablet (10 mg total) by mouth at bedtime. 08/21/16 08/31/16  Fatima Blank, MD  hydrocerin (EUCERIN) CREA Apply 1 application topically 2 (two) times daily. 05/28/15   Derrill Center, NP  pantoprazole (PROTONIX) 20 MG tablet Take 1 tablet (20 mg total) by mouth daily. 05/28/15  Derrill Center, NP  traZODone (DESYREL) 100 MG tablet Take 1 tablet (100 mg total) by mouth at bedtime as needed and may repeat dose one time if needed for sleep. 05/28/15   Derrill Center, NP    Family History Family History  Problem Relation Age of Onset  . Coronary artery disease Father   . Coronary artery disease Maternal Grandmother   . Breast cancer Mother   . Cancer Mother     Social History Social History  Substance Use Topics  . Smoking status: Current Every Day Smoker    Packs/day: 1.00    Years: 14.00    Types:  Cigarettes  . Smokeless tobacco: Never Used  . Alcohol use No     Allergies   Epidural tray 17gx3-1-2" [nerve block tray]; Ibuprofen; Zofran [ondansetron hcl]; and Penicillins   Review of Systems Review of Systems  Constitutional: Negative for fever.  Cardiovascular: Negative for chest pain.  Gastrointestinal: Negative for bowel incontinence.  Genitourinary: Negative for bladder incontinence, dysuria and pelvic pain.  Musculoskeletal: Positive for back pain.  Neurological: Negative for numbness and paresthesias.   All other systems are reviewed and are negative for acute change except as noted in the HPI  Physical Exam Updated Vital Signs BP 125/78 (BP Location: Right Arm)   Pulse (!) 120   Temp 99.2 F (37.3 C) (Oral)   Resp 16   LMP 08/10/2011   SpO2 97%   Physical Exam  Constitutional: She is oriented to person, place, and time. She appears well-developed and well-nourished. No distress.  HENT:  Head: Normocephalic and atraumatic.  Right Ear: External ear normal.  Left Ear: External ear normal.  Nose: Nose normal.  Eyes: Conjunctivae and EOM are normal. No scleral icterus.  Neck: Normal range of motion and phonation normal.  Cardiovascular: Normal rate and regular rhythm.   Pulmonary/Chest: Effort normal. No stridor. No respiratory distress.  Abdominal: She exhibits no distension. There is no tenderness. There is no rigidity, no rebound and no guarding.  Musculoskeletal: Normal range of motion. She exhibits no edema.       Lumbar back: She exhibits tenderness (mild). She exhibits no bony tenderness and no deformity.  Neurological: She is alert and oriented to person, place, and time.  Spine Exam: Strength: 5/5 throughout LE bilaterally (hip flexion/extension, adduction/abduction; knee flexion/extension; foot dorsiflexion/plantarflexion, inversion/eversion; great toe inversion) Sensation: Intact to light touch in proximal and distal LE bilaterally Reflexes: 1+  quadriceps and achilles reflexes     Skin: She is not diaphoretic.  Psychiatric: She has a normal mood and affect. Her behavior is normal.  Vitals reviewed.    ED Treatments / Results  Labs (all labs ordered are listed, but only abnormal results are displayed) Labs Reviewed - No data to display  EKG  EKG Interpretation None       Radiology No results found.  Procedures Procedures (including critical care time)  Medications Ordered in ED Medications  methocarbamol (ROBAXIN) tablet 750 mg (not administered)  acetaminophen (TYLENOL) tablet 1,000 mg (not administered)  naproxen (NAPROSYN) tablet 375 mg (not administered)     Initial Impression / Assessment and Plan / ED Course  I have reviewed the triage vital signs and the nursing notes.  Pertinent labs & imaging results that were available during my care of the patient were reviewed by me and considered in my medical decision making (see chart for details).     36 y.o. female presents with back pain in lumbar area for  2 hours after fall with signs of radicular pain to left leg. No red flag symptoms of fever, weight loss, saddle anesthesia, weakness, fecal/urinary incontinence or urinary retention.   Recommended imaging of the back to assess for fracture, but patient declined.  Suspect MSK etiology. Patient was recommended to take short course of scheduled NSAIDs (Naproxen as pt states that she can take this w/o reaction) and engage in early mobility as definitive treatment. Return precautions discussed for worsening or new concerning symptoms.   Will Provide pt with Rx for Flexeril.  The patient is safe for discharge with strict return precautions.    Final Clinical Impressions(s) / ED Diagnoses   Final diagnoses:  Fall, initial encounter  Lumbosacral pain   Disposition: Discharge  Condition: Good  I have discussed the results, Dx and Tx plan with the patient who expressed understanding and agree(s) with  the plan. Discharge instructions discussed at great length. The patient was given strict return precautions who verbalized understanding of the instructions. No further questions at time of discharge.    New Prescriptions   CYCLOBENZAPRINE (FLEXERIL) 10 MG TABLET    Take 1 tablet (10 mg total) by mouth at bedtime.    Follow Up: [name], MD [place] [id number] [phone]  Schedule an appointment as soon as possible for a visit    Orthopedic surgery  Schedule an appointment as soon as possible for a visit  As needed      Zalma Channing, Grayce Sessions, MD 08/21/16 1529

## 2016-08-21 NOTE — Discharge Instructions (Signed)
Take 1-2 tabs of Aleve (220-440mg ) twice per day as needed for pain. In addition, you may take Acetaminophen (Tylenol) 1000mg  every eight hours as needed for pain.

## 2016-08-21 NOTE — ED Triage Notes (Signed)
Patient slipped and fell on her back patient has a history of a herniated disk. Patient has chronic pain 4/10 now is at a 9/10. No obvious deformities no swelling redness noted.

## 2016-11-10 ENCOUNTER — Emergency Department (HOSPITAL_COMMUNITY)
Admission: EM | Admit: 2016-11-10 | Discharge: 2016-11-10 | Disposition: A | Discharge status: Home / Self Care | Payer: Medicaid Other | Attending: Emergency Medicine | Admitting: Emergency Medicine

## 2016-11-10 ENCOUNTER — Encounter (HOSPITAL_COMMUNITY): Payer: Self-pay | Admitting: Emergency Medicine

## 2016-11-10 ENCOUNTER — Encounter (HOSPITAL_COMMUNITY): Payer: Self-pay

## 2016-11-10 ENCOUNTER — Emergency Department (HOSPITAL_COMMUNITY)
Admission: EM | Admit: 2016-11-10 | Discharge: 2016-11-10 | Disposition: A | Discharge status: Home / Self Care | Payer: Medicaid Other | Source: Home / Self Care | Attending: Emergency Medicine | Admitting: Emergency Medicine

## 2016-11-10 DIAGNOSIS — Z79899 Other long term (current) drug therapy: Secondary | ICD-10-CM | POA: Insufficient documentation

## 2016-11-10 DIAGNOSIS — K6289 Other specified diseases of anus and rectum: Secondary | ICD-10-CM | POA: Diagnosis present

## 2016-11-10 DIAGNOSIS — K649 Unspecified hemorrhoids: Secondary | ICD-10-CM

## 2016-11-10 DIAGNOSIS — F17210 Nicotine dependence, cigarettes, uncomplicated: Secondary | ICD-10-CM | POA: Diagnosis not present

## 2016-11-10 MED ORDER — HYDROCODONE-ACETAMINOPHEN 5-325 MG PO TABS
1.0000 | ORAL_TABLET | Freq: Once | ORAL | Status: AC | PRN
  Administered 2016-11-10: 1 via ORAL
  Filled 2016-11-10: qty 1

## 2016-11-10 MED ORDER — LIDOCAINE HCL 2 % EX GEL: 1.0000 "application " | CUTANEOUS | 0 refills | Status: DC | PRN

## 2016-11-10 MED ORDER — LIDOCAINE HCL 2 % EX GEL
1.0000 "application " | Freq: Once | CUTANEOUS | Status: AC
  Administered 2016-11-10: 1 via TOPICAL
  Filled 2016-11-10: qty 11

## 2016-11-10 MED ORDER — OXYCODONE-ACETAMINOPHEN 5-325 MG PO TABS
1.0000 | ORAL_TABLET | Freq: Once | ORAL | Status: AC
  Administered 2016-11-10: 1 via ORAL
  Filled 2016-11-10: qty 1

## 2016-11-10 MED ORDER — LIDOCAINE HCL 2 % EX GEL: 1.0000 "application " | Freq: Once | CUTANEOUS | Status: DC

## 2016-11-10 MED ORDER — HYDROCORTISONE 2.5 % RE CREA: TOPICAL_CREAM | RECTAL | 0 refills | Status: DC

## 2016-11-10 MED ORDER — HYDROCORTISONE ACETATE 25 MG RE SUPP
25.0000 mg | Freq: Once | RECTAL | Status: AC
Start: 2016-11-10 — End: 2016-11-10
  Administered 2016-11-10: 25 mg via RECTAL
  Filled 2016-11-10: qty 1

## 2016-11-10 NOTE — ED Notes (Signed)
Patient has a round mass right near rectum.

## 2016-11-10 NOTE — Discharge Instructions (Signed)
Take the prescribed medication as directed.  Can use OTC medications to help with pain. Can also do sitz bath-- kit is available over the counter. Follow-up with general surgery-- call in the morning for appt. If you want to see about getting this removed surgically. Return to the ED for new or worsening symptoms.

## 2016-11-10 NOTE — ED Notes (Signed)
Patient refused vital signs. Patient wants more pain medication. PA talked with patient and explained what she need to do.

## 2016-11-10 NOTE — ED Notes (Signed)
Pt has had 2-3 episodes of blood clots in her stool since Wednesday. It is unknown if the blood clots are bright or dark red. She also has a painful mass partway inside her anus and partway out that is reported to be about 2-3 cm in diameter.

## 2016-11-10 NOTE — Discharge Instructions (Signed)
Take the prescribed medication you were given earlier today as directed.  Alternate with Tylenol and ibuprofen as needed for pain.  Warm sitz baths will also help!  Follow-up with general surgery-- please call today to arrange a follow up appointment. Please let them know you were seen in the ER and encouraged to follow up as soon as possible.  Return to the ED for new or worsening symptoms.

## 2016-11-10 NOTE — ED Triage Notes (Signed)
Reports having a hemorrhoid that developed over two days.  Seen at [facility].  Scheduled for surgery in the morning.  Here due to increased pain after having suppository placed.

## 2016-11-10 NOTE — ED Provider Notes (Signed)
[name]   CSN: [id number] Arrival date & time: 11/10/16  0530     History   Chief Complaint Chief Complaint  Patient presents with  . Hemorrhoids    HPI [name] [name] is a 36 y.o. female.  The history is provided by the patient and medical records. No language interpreter was used.   [name] [name] is a 36 y.o. female  with a PMH of chronic pain, anxiety, depression, constipation who presents to the Emergency Department complaining of progressively worsening constant rectal pain x 3 days due to a large hemorrhoid. She states that although painful, she is able to push the hemorrhoid back in but it will just come back out. She has hx of similar although has never had one this large before. Pain is worse with coughing, bowel movements and pressure to the area. Better when still and no pressure on the area. Patient was seen in ED earlier this morning at [facility] ED and given lidocaine jelly and hydrocortisone suppository. She states this has given her some relief in pain, but still 10/10 on pain scale. She was also told to call the surgery clinic this morning at 8am to arrange follow up for likely excision by gensurg.   Past Medical History:  Diagnosis Date  . Anxiety    klonipin for stress disorder  . Chronic back pain   . Degenerative disc disease   . Endometriosis    chronic pain  . GERD (gastroesophageal reflux disease)    protonix evenings  . Migraine     Patient Active Problem List   Diagnosis Date Noted  . Severe episode of recurrent major depressive disorder, without psychotic features ([place])   . MDD (major depressive disorder), recurrent severe, without psychosis ([place]) 09/22/2014  . Opioid use disorder, severe, dependence ([place]) 09/22/2014  . Hypokalemia 09/22/2014  . Tylenol overdose 09/19/2014  . Suicide attempt ([place]) 09/19/2014  . GERD (gastroesophageal reflux disease) 09/19/2014  . Drug dependence ([place])   .  Polysubstance (including opioids) dependence with physiological dependence ([place]) 07/08/2013  . Suicidal ideation 07/06/2013  . Nausea & vomiting 07/06/2013  . Chronic pain disorder 07/06/2013  . Cocaine abuse ([place]) 07/06/2013  . Major depression 01/05/2012  . Alcohol dependence ([place]) 01/04/2012  . Opioid dependence ([place]) 12/29/2011  . Back pain 12/29/2011  . N&V (nausea and vomiting) 06/21/2011  . Tachycardia 06/21/2011  . Abdominal pain 06/21/2011  . Leukocytosis 06/21/2011    Past Surgical History:  Procedure Laterality Date  . ABDOMINAL HYSTERECTOMY    . APPENDECTOMY    . LAPAROSCOPIC ASSISTED VAGINAL HYSTERECTOMY  08/19/2011   Procedure: LAPAROSCOPIC ASSISTED VAGINAL HYSTERECTOMY;  Surgeon: Gus Height, MD;  Location: [facility];  Service: Gynecology;  Laterality: N/A;  . TONSILLECTOMY    . TUBAL LIGATION      OB History    Gravida Para Term Preterm AB Living   6 2           SAB TAB Ectopic Multiple Live Births                   Home Medications    Prior to Admission medications   Medication Sig Start Date End Date Taking? Authorizing Provider  clonazePAM (KLONOPIN) 1 MG tablet Take 1 mg by mouth 2 (two) times daily. 11/05/16   [provider]  esomeprazole (NEXIUM) 40 MG capsule Take 40 mg by mouth daily. 10/10/16   [provider]  hydrocerin (EUCERIN) CREA Apply 1 application  topically 2 (two) times daily. Patient not taking: Reported on 11/10/2016 05/28/15   Derrill Center, NP  hydrocortisone (ANUSOL-HC) 2.5 % rectal cream Apply rectally 2 times daily 11/10/16   Larene Pickett, PA-C  lamoTRIgine (LAMICTAL) 100 MG tablet Take 100 mg by mouth daily. 10/10/16   [provider]  pantoprazole (PROTONIX) 20 MG tablet Take 1 tablet (20 mg total) by mouth daily. Patient not taking: Reported on 11/10/2016 05/28/15   Derrill Center, NP  SUBOXONE 8-2 MG FILM Place 1 Film under the tongue 3 (three) times daily. 11/05/16   [provider]    topiramate (TOPAMAX) 100 MG tablet Take 100 mg by mouth at bedtime. 10/09/16   [provider]  traZODone (DESYREL) 100 MG tablet Take 1 tablet (100 mg total) by mouth at bedtime as needed and may repeat dose one time if needed for sleep. Patient not taking: Reported on 11/10/2016 05/28/15   Derrill Center, NP  VYVANSE 70 MG capsule Take 70 mg by mouth daily after breakfast. 11/07/16   [provider]    Family History Family History  Problem Relation Age of Onset  . Coronary artery disease Father   . Coronary artery disease Maternal Grandmother   . Breast cancer Mother   . Cancer Mother     Social History Social History  Substance Use Topics  . Smoking status: Current Every Day Smoker    Packs/day: 1.00    Years: 14.00    Types: Cigarettes  . Smokeless tobacco: Never Used  . Alcohol use No     Allergies   Epidural tray 17gx3-1-2" [nerve block tray]; Ibuprofen; Zofran [ondansetron hcl]; and Penicillins   Review of Systems Review of Systems  Constitutional: Negative for chills and fever.  Gastrointestinal: Positive for constipation and rectal pain. Negative for abdominal pain, nausea and vomiting.  Musculoskeletal: Negative for back pain.     Physical Exam Updated Vital Signs BP (!) 124/95   Pulse (!) 118   Temp 98.7 F (37.1 C) (Oral)   Resp 20   Ht 5\' 2"  (1.575 m)   Wt 86.2 kg (190 lb)   LMP 08/10/2011   SpO2 100%   BMI 34.75 kg/m   Physical Exam  Constitutional: She appears well-developed and well-nourished. No distress.  HENT:  Head: Normocephalic and atraumatic.  Neck: Neck supple.  Cardiovascular: Normal rate, regular rhythm and normal heart sounds.   No murmur heard. Pulmonary/Chest: Effort normal and breath sounds normal. No respiratory distress. She has no wheezes. She has no rales.  Abdominal: Soft. She exhibits no distension. There is no tenderness.  Genitourinary:  Genitourinary Comments: Large tender, reducible pink area c/w  hemorrhoid. Mostly external, but does track internally. No active bleeding, drainage or fluctuance.  Neurological: She is alert.  Skin: Skin is warm and dry.  Nursing note and vitals reviewed.    ED Treatments / Results  Labs (all labs ordered are listed, but only abnormal results are displayed) Labs Reviewed - No data to display  EKG  EKG Interpretation None       Radiology No results found.  Procedures Procedures (including critical care time)  Medications Ordered in ED Medications  HYDROcodone-acetaminophen (NORCO/VICODIN) 5-325 MG per tablet 1 tablet (not administered)     Initial Impression / Assessment and Plan / ED Course  I have reviewed the triage vital signs and the nursing notes.  Pertinent labs & imaging results that were available during my care of the patient were reviewed  by me and considered in my medical decision making (see chart for details).    Alvie Speltz is a 36 y.o. female who presents to ED for rectal pain. Seen at [facility] ED earlier today and given anusol, lidocaine jelly and told she needs to follow up with general surgery. Patient states that she returned to ED today because she is still in a great deal of pain. I agree with previous provider assessment that it is in the best interest of the patient to follow up with general surgery for likely excision. I informed patient that I also agree that narcotic pain medication is not in her best interest, especially considering history and that she has not tried symptomatic home care instructions recommended earlier such as sitz baths or OTC pain medication. Patient to call General Surgery today this morning. Understands home care instructions. All questions answered.   Final Clinical Impressions(s) / ED Diagnoses   Final diagnoses:  Hemorrhoids, unspecified hemorrhoid type  Rectal pain    New Prescriptions New Prescriptions   No medications on file     Justise Ehmann, Ozella Almond, PA-C 11/10/16 1638      Ezequiel Essex, MD 11/10/16 [phone]

## 2016-11-10 NOTE — ED Triage Notes (Signed)
States since Thursday has had some rectal bleeding with clots states big mass at rectal area and states it is painful.

## 2016-11-10 NOTE — ED Provider Notes (Signed)
[facility] DEPT Provider Note   CSN: [id number] Arrival date & time: 11/10/16  0111     History   Chief Complaint Chief Complaint  Patient presents with  . Rectal Pain    HPI [name] [name] is a 36 y.o. female.  The history is provided by the patient and medical records.    36 year old female with history of anxiety, chronic back pain, endometriosis, GERD, migraine headaches, presenting to the ED for rectal pain.  Patient states this has been ongoing for the past 4 days.  States initially area on her rectum was small but has increased in size.  States she has been able to "push the tissue back inside" but it comes back out because of its size.  States she does have some bleeding with BM's-- bright red in color, occasional small clots.  No blood intermixed in the stool.  She does have history of internal and external hemorrhoids.  Has been using lidocaine jelly at home without relief.  Reports history of colon cancer without rectal involvement.  Past Medical History:  Diagnosis Date  . Anxiety    klonipin for stress disorder  . Chronic back pain   . Degenerative disc disease   . Endometriosis    chronic pain  . GERD (gastroesophageal reflux disease)    protonix evenings  . Migraine     Patient Active Problem List   Diagnosis Date Noted  . Severe episode of recurrent major depressive disorder, without psychotic features ([place])   . MDD (major depressive disorder), recurrent severe, without psychosis ([place]) 09/22/2014  . Opioid use disorder, severe, dependence ([place]) 09/22/2014  . Hypokalemia 09/22/2014  . Tylenol overdose 09/19/2014  . Suicide attempt ([place]) 09/19/2014  . GERD (gastroesophageal reflux disease) 09/19/2014  . Drug dependence ([place])   . Polysubstance (including opioids) dependence with physiological dependence ([place]) 07/08/2013  . Suicidal ideation 07/06/2013  . Nausea & vomiting 07/06/2013  . Chronic pain disorder 07/06/2013  .  Cocaine abuse ([place]) 07/06/2013  . Major depression 01/05/2012  . Alcohol dependence ([name]) 01/04/2012  . Opioid dependence ([place]) 12/29/2011  . Back pain 12/29/2011  . N&V (nausea and vomiting) 06/21/2011  . Tachycardia 06/21/2011  . Abdominal pain 06/21/2011  . Leukocytosis 06/21/2011    Past Surgical History:  Procedure Laterality Date  . ABDOMINAL HYSTERECTOMY    . APPENDECTOMY    . LAPAROSCOPIC ASSISTED VAGINAL HYSTERECTOMY  08/19/2011   Procedure: LAPAROSCOPIC ASSISTED VAGINAL HYSTERECTOMY;  Surgeon: Gus Height, MD;  Location: [facility];  Service: Gynecology;  Laterality: N/A;  . TONSILLECTOMY    . TUBAL LIGATION      OB History    Gravida Para Term Preterm AB Living   6 2           SAB TAB Ectopic Multiple Live Births                   Home Medications    Prior to Admission medications   Medication Sig Start Date End Date Taking? Authorizing Provider  clonazePAM (KLONOPIN) 1 MG tablet Take 1 mg by mouth 2 (two) times daily. 11/05/16  Yes [provider]  esomeprazole (NEXIUM) 40 MG capsule Take 40 mg by mouth daily. 10/10/16  Yes [provider]  lamoTRIgine (LAMICTAL) 100 MG tablet Take 100 mg by mouth daily. 10/10/16  Yes [provider]  topiramate (TOPAMAX) 100 MG tablet Take 100 mg by mouth at bedtime. 10/09/16  Yes [provider]  VYVANSE 70 MG capsule  Take 70 mg by mouth daily after breakfast. 11/07/16  Yes [provider]  hydrocerin (EUCERIN) CREA Apply 1 application topically 2 (two) times daily. Patient not taking: Reported on 11/10/2016 05/28/15   Derrill Center, NP  pantoprazole (PROTONIX) 20 MG tablet Take 1 tablet (20 mg total) by mouth daily. Patient not taking: Reported on 11/10/2016 05/28/15   Derrill Center, NP  SUBOXONE 8-2 MG FILM Place 1 Film under the tongue 3 (three) times daily. 11/05/16   [provider]  traZODone (DESYREL) 100 MG tablet Take 1 tablet (100 mg total) by mouth at bedtime as needed  and may repeat dose one time if needed for sleep. Patient not taking: Reported on 11/10/2016 05/28/15   Derrill Center, NP    Family History Family History  Problem Relation Age of Onset  . Coronary artery disease Father   . Coronary artery disease Maternal Grandmother   . Breast cancer Mother   . Cancer Mother     Social History Social History  Substance Use Topics  . Smoking status: Current Every Day Smoker    Packs/day: 1.00    Years: 14.00    Types: Cigarettes  . Smokeless tobacco: Never Used  . Alcohol use No     Allergies   Epidural tray 17gx3-1-2" [nerve block tray]; Ibuprofen; Zofran [ondansetron hcl]; and Penicillins   Review of Systems Review of Systems  Gastrointestinal: Positive for rectal pain.  All other systems reviewed and are negative.    Physical Exam Updated Vital Signs BP (!) 142/112 (BP Location: Left Arm)   Pulse (!) 121   Temp 98.8 F (37.1 C) (Oral)   Resp 18   LMP 08/10/2011   SpO2 97%   Physical Exam  Constitutional: She is oriented to person, place, and time. She appears well-developed and well-nourished.  HENT:  Head: Normocephalic and atraumatic.  Mouth/Throat: Oropharynx is clear and moist.  Eyes: Pupils are equal, round, and reactive to light. Conjunctivae and EOM are normal.  Neck: Normal range of motion.  Cardiovascular: Normal rate, regular rhythm and normal heart sounds.   Pulmonary/Chest: Effort normal and breath sounds normal.  Abdominal: Soft. Bowel sounds are normal. There is no tenderness. There is no rebound.  Genitourinary:  Genitourinary Comments: Very large (what appears to be) hemorrhoid noted externally that tracks internally, this is reducible and locally tender to palpation; there is no active bleeding; small area of possible early thrombosis noted on medial margin, remainder of tissue appears pink; I do not appreciate any colonic tissue to suggest rectal prolapse; no drainage or fluctuance to suggest abscess    Musculoskeletal: Normal range of motion.  Neurological: She is alert and oriented to person, place, and time.  Skin: Skin is warm and dry.  Psychiatric: She has a normal mood and affect.  Nursing note and vitals reviewed.    ED Treatments / Results  Labs (all labs ordered are listed, but only abnormal results are displayed) Labs Reviewed - No data to display  EKG  EKG Interpretation None       Radiology No results found.  Procedures Procedures (including critical care time)  Medications Ordered in ED Medications  lidocaine (XYLOCAINE) 2 % jelly 1 application (1 application Topical Given 11/10/16 0220)  oxyCODONE-acetaminophen (PERCOCET/ROXICET) 5-325 MG per tablet 1 tablet (1 tablet Oral Given 11/10/16 0318)  hydrocortisone (ANUSOL-HC) suppository 25 mg (25 mg Rectal Given 11/10/16 0320)     Initial Impression / Assessment and Plan / ED Course  I  have reviewed the triage vital signs and the nursing notes.  Pertinent labs & imaging results that were available during my care of the patient were reviewed by me and considered in my medical decision making (see chart for details).  36 year old female here with rectal pain. On exam she has what appears to be a rather large sized hemorrhoid that tracks internally. On the medial margin there are possibly some early signs of thrombosis, however remainder of tissue appears pink. I do not appreciate any colonic tissue to suggest rectal prolapse. No signs of rectal abscess. Hemorrhoid is reducible. Given the size of this, should it need to be excised I feel it should be done in a controlled environment such as with general surgery as I do anticipate quite a bit of bleeding. This was discussed with patient and her significant other, they acknowledged understanding. Will have her start Anusol suppositories, sitz bath, over-the-counter medications for pain. Patient was given dose of Percocet in the ED, however she has prior history of  multiple intentional medication overdoses in the past and I do not feel comfortable prescribing her narcotics at home.  Referred to general surgery clinic for follow-up-- call in the morning for appt.  Discussed plan with patient, she acknowledged understanding and agreed with plan of care.  Return precautions given for new or worsening symptoms.  4:05 AM At time of discharge, patient is refusing to leave. I have gone back into her room with RN to try to address her concerns. She began screaming and cursing, yelling that no one has "asked her pain number". I discussed with her that from her yelling and screaming, it was quite apparent that she was in pain and I felt like giving this a number was redundant at this point. She is asking why she was not treated here, I told her that we have given her lidocaine jelly, Anusol suppository which she removed, as well as oral Percocet. I discussed with her that given her history of opiate addiction and multiple prior overdoses requiring hospitalization/admission, I do not feel comfortable sending her home with narcotics. Patient then began yelling that she did not want to poop because of the pain.  I acknowledged this but again, reiterated there is not much I could do about this.  I did recommend a stool softener to help ease bowel movements to make less painful.  At this point, patient stated "ok" and left.  Final Clinical Impressions(s) / ED Diagnoses   Final diagnoses:  Rectal pain  Hemorrhoids, unspecified hemorrhoid type    New Prescriptions Discharge Medication List as of 11/10/2016  3:48 AM    START taking these medications   Details  hydrocortisone (ANUSOL-HC) 2.5 % rectal cream Apply rectally 2 times daily, Print         Larene Pickett, PA-C 11/10/16 0456    Ward, Delice Bison, DO 11/10/16 [phone]

## 2016-11-16 ENCOUNTER — Emergency Department (HOSPITAL_COMMUNITY)
Admission: EM | Admit: 2016-11-16 | Discharge: 2016-11-16 | Disposition: A | Discharge status: Home / Self Care | Payer: Medicaid Other | Attending: Emergency Medicine | Admitting: Emergency Medicine

## 2016-11-16 ENCOUNTER — Emergency Department (HOSPITAL_COMMUNITY): Payer: Medicaid Other

## 2016-11-16 ENCOUNTER — Encounter (HOSPITAL_COMMUNITY): Payer: Self-pay

## 2016-11-16 DIAGNOSIS — G8918 Other acute postprocedural pain: Secondary | ICD-10-CM | POA: Insufficient documentation

## 2016-11-16 DIAGNOSIS — F17210 Nicotine dependence, cigarettes, uncomplicated: Secondary | ICD-10-CM | POA: Insufficient documentation

## 2016-11-16 DIAGNOSIS — Z79899 Other long term (current) drug therapy: Secondary | ICD-10-CM | POA: Insufficient documentation

## 2016-11-16 DIAGNOSIS — K6289 Other specified diseases of anus and rectum: Secondary | ICD-10-CM | POA: Diagnosis present

## 2016-11-16 LAB — CBC WITH DIFFERENTIAL/PLATELET
BASOS ABS: 0 10*3/uL (ref 0.0–0.1)
Basophils Relative: 0 %
EOS PCT: 0 %
Eosinophils Absolute: 0 10*3/uL (ref 0.0–0.7)
HCT: 40.7 % (ref 36.0–46.0)
Hemoglobin: 13.6 g/dL (ref 12.0–15.0)
Lymphocytes Relative: 22 %
Lymphs Abs: 2.5 10*3/uL (ref 0.7–4.0)
MCH: 30.4 pg (ref 26.0–34.0)
MCHC: 33.4 g/dL (ref 30.0–36.0)
MCV: 91.1 fL (ref 78.0–100.0)
Monocytes Absolute: 0.6 10*3/uL (ref 0.1–1.0)
Monocytes Relative: 5 %
Neutro Abs: 8.5 10*3/uL — ABNORMAL HIGH (ref 1.7–7.7)
Neutrophils Relative %: 73 %
Platelets: 309 10*3/uL (ref 150–400)
RBC: 4.47 MIL/uL (ref 3.87–5.11)
RDW: 13.6 % (ref 11.5–15.5)
WBC: 11.6 10*3/uL — AB (ref 4.0–10.5)

## 2016-11-16 LAB — URINALYSIS, ROUTINE W REFLEX MICROSCOPIC
Bilirubin Urine: NEGATIVE
Glucose, UA: NEGATIVE mg/dL
HGB URINE DIPSTICK: NEGATIVE
KETONES UR: NEGATIVE mg/dL
Leukocytes, UA: NEGATIVE
Nitrite: NEGATIVE
PROTEIN: NEGATIVE mg/dL
Specific Gravity, Urine: >1.046 — ABNORMAL HIGH (ref 1.005–1.030)
pH: 8 (ref 5.0–8.0)

## 2016-11-16 LAB — COMPREHENSIVE METABOLIC PANEL
ALK PHOS: 74 U/L (ref 38–126)
ALT: 43 U/L (ref 14–54)
AST: 41 U/L (ref 15–41)
Albumin: 3.4 g/dL — ABNORMAL LOW (ref 3.5–5.0)
Anion gap: 10 (ref 5–15)
BILIRUBIN TOTAL: 1.5 mg/dL — AB (ref 0.3–1.2)
BUN: 5 mg/dL — AB (ref 6–20)
CO2: 19 mmol/L — ABNORMAL LOW (ref 22–32)
CREATININE: 0.82 mg/dL (ref 0.44–1.00)
Calcium: 8.9 mg/dL (ref 8.9–10.3)
Chloride: 108 mmol/L (ref 101–111)
GFR calc Af Amer: >60 mL/min (ref >60)
Glucose, Bld: 122 mg/dL — ABNORMAL HIGH (ref 65–99)
Potassium: 4.4 mmol/L (ref 3.5–5.1)
Sodium: 137 mmol/L (ref 135–145)
TOTAL PROTEIN: 6.3 g/dL — AB (ref 6.5–8.1)

## 2016-11-16 LAB — I-STAT CG4 LACTIC ACID, ED
Lactic Acid, Venous: 2.5 mmol/L (ref 0.5–1.9)
Lactic Acid, Venous: 1.85 mmol/L (ref 0.5–1.9)

## 2016-11-16 LAB — I-STAT BETA HCG BLOOD, ED (MC, WL, AP ONLY)

## 2016-11-16 MED ORDER — FENTANYL CITRATE (PF) 100 MCG/2ML IJ SOLN
100.0000 ug | Freq: Once | INTRAMUSCULAR | Status: AC
  Administered 2016-11-16: 100 ug via INTRAVENOUS
  Filled 2016-11-16: qty 2

## 2016-11-16 MED ORDER — SODIUM CHLORIDE 0.9 % IV BOLUS (SEPSIS)
1000.0000 mL | Freq: Once | INTRAVENOUS | Status: AC
  Administered 2016-11-16: 1000 mL via INTRAVENOUS

## 2016-11-16 MED ORDER — POLYETHYLENE GLYCOL 3350 17 G PO PACK: 17.0000 g | PACK | Freq: Every day | ORAL | 0 refills | Status: DC

## 2016-11-16 MED ORDER — IOPAMIDOL (ISOVUE-300) INJECTION 61%
INTRAVENOUS | Status: AC
  Administered 2016-11-16: 100 mL via INTRAVENOUS
  Filled 2016-11-16: qty 100

## 2016-11-16 NOTE — ED Provider Notes (Signed)
[facility] EMERGENCY DEPARTMENT Provider Note   CSN: [id number] Arrival date & time: 11/16/16  1050     History   Chief Complaint No chief complaint on file.   HPI [name] [name] is a 36 y.o. female.  HPI Patient had hemorrhoid surgery in the office by Dr. Marcello Moores 6 days ago.  Since then she had pain.  Said chills and possibly fevers at home.  Pain having bowel movements and some pain with urinating she states that straining hurts her rectal area.  Has had blood draining at home.  Has been eating less because of the pain.  She is on Suboxone.  Patient does not have a thermometer at home.  Has had some bloody drainage. Past Medical History:  Diagnosis Date  . Anxiety    klonipin for stress disorder  . Chronic back pain   . Degenerative disc disease   . Endometriosis    chronic pain  . GERD (gastroesophageal reflux disease)    protonix evenings  . Migraine     Patient Active Problem List   Diagnosis Date Noted  . Severe episode of recurrent major depressive disorder, without psychotic features ([place])   . MDD (major depressive disorder), recurrent severe, without psychosis ([place]) 09/22/2014  . Opioid use disorder, severe, dependence ([place]) 09/22/2014  . Hypokalemia 09/22/2014  . Tylenol overdose 09/19/2014  . Suicide attempt ([place]) 09/19/2014  . GERD (gastroesophageal reflux disease) 09/19/2014  . Drug dependence ([place])   . Polysubstance (including opioids) dependence with physiological dependence ([place]) 07/08/2013  . Suicidal ideation 07/06/2013  . Nausea & vomiting 07/06/2013  . Chronic pain disorder 07/06/2013  . Cocaine abuse ([place]) 07/06/2013  . Major depression 01/05/2012  . Alcohol dependence ([place]) 01/04/2012  . Opioid dependence ([place]) 12/29/2011  . Back pain 12/29/2011  . N&V (nausea and vomiting) 06/21/2011  . Tachycardia 06/21/2011  . Abdominal pain 06/21/2011  . Leukocytosis 06/21/2011    Past Surgical History:  Procedure Laterality Date    . ABDOMINAL HYSTERECTOMY    . APPENDECTOMY    . LAPAROSCOPIC ASSISTED VAGINAL HYSTERECTOMY  08/19/2011   Procedure: LAPAROSCOPIC ASSISTED VAGINAL HYSTERECTOMY;  Surgeon: Gus Height, MD;  Location: [facility];  Service: Gynecology;  Laterality: N/A;  . TONSILLECTOMY    . TUBAL LIGATION      OB History    Gravida Para Term Preterm AB Living   6 2           SAB TAB Ectopic Multiple Live Births                   Home Medications    Prior to Admission medications   Medication Sig Start Date End Date Taking? Authorizing Provider  clonazePAM (KLONOPIN) 1 MG tablet Take 1 mg by mouth 2 (two) times daily. 11/05/16   [provider]  esomeprazole (NEXIUM) 40 MG capsule Take 40 mg by mouth daily. 10/10/16   [provider]  hydrocerin (EUCERIN) CREA Apply 1 application topically 2 (two) times daily. Patient not taking: Reported on 11/10/2016 05/28/15   Derrill Center, NP  hydrocortisone (ANUSOL-HC) 2.5 % rectal cream Apply rectally 2 times daily 11/10/16   Larene Pickett, PA-C  lamoTRIgine (LAMICTAL) 100 MG tablet Take 100 mg by mouth daily. 10/10/16   [provider]  lidocaine (XYLOCAINE) 2 % jelly Apply 1 application topically as needed. 11/10/16   Ward, Ozella Almond, PA-C  pantoprazole (PROTONIX) 20 MG tablet Take 1 tablet (20 mg total) by mouth daily. Patient  not taking: Reported on 11/10/2016 05/28/15   Derrill Center, NP  polyethylene glycol (MIRALAX / GLYCOLAX) packet Take 17 g by mouth daily. 11/16/16   Davonna Belling, MD  SUBOXONE 8-2 MG FILM Place 1 Film under the tongue 3 (three) times daily. 11/05/16   [provider]  topiramate (TOPAMAX) 100 MG tablet Take 100 mg by mouth at bedtime. 10/09/16   [provider]  traZODone (DESYREL) 100 MG tablet Take 1 tablet (100 mg total) by mouth at bedtime as needed and may repeat dose one time if needed for sleep. Patient not taking: Reported on 11/10/2016 05/28/15   Derrill Center, NP  VYVANSE 70 MG  capsule Take 70 mg by mouth daily after breakfast. 11/07/16   [provider]    Family History Family History  Problem Relation Age of Onset  . Coronary artery disease Father   . Coronary artery disease Maternal Grandmother   . Breast cancer Mother   . Cancer Mother     Social History Social History  Substance Use Topics  . Smoking status: Current Every Day Smoker    Packs/day: 1.00    Years: 14.00    Types: Cigarettes  . Smokeless tobacco: Never Used  . Alcohol use No     Allergies   Epidural tray 17gx3-1-2" [nerve block tray]; Ibuprofen; Zofran [ondansetron hcl]; and Penicillins   Review of Systems Review of Systems  Constitutional: Positive for appetite change and chills.  HENT: Negative for congestion.   Respiratory: Negative for shortness of breath.   Cardiovascular: Negative for chest pain.  Gastrointestinal: Positive for anal bleeding and rectal pain. Negative for abdominal pain and constipation.  Genitourinary: Negative for flank pain.  Musculoskeletal: Negative for back pain.  Skin: Negative for wound.  Neurological: Negative for numbness.  Hematological: Negative for adenopathy.  Psychiatric/Behavioral: Negative for confusion.     Physical Exam Updated Vital Signs BP 118/83   Pulse (!) 117   Temp 98.3 F (36.8 C) (Oral)   Resp 13   Ht 5\' 2"  (1.575 m)   Wt 86.2 kg (190 lb)   LMP 08/10/2011   SpO2 97%   BMI 34.75 kg/m   Physical Exam  Constitutional: She appears well-developed.  HENT:  Head: Normocephalic.  Eyes: Pupils are equal, round, and reactive to light.  Neck: Neck supple.  Cardiovascular:  Tachycardia  Pulmonary/Chest: Effort normal.  Abdominal: There is no tenderness.  Genitourinary:  Genitourinary Comments: Has hemorrhoid that looks postsurgical on the right side lateral area.  No active bloody drainage.  No purulent drainage.  Patient is tender.  Without clear swelling.  Neurological: She is alert.  Skin: Capillary  refill takes less than 2 seconds.     ED Treatments / Results  Labs (all labs ordered are listed, but only abnormal results are displayed) Labs Reviewed  COMPREHENSIVE METABOLIC PANEL - Abnormal; Notable for the following:       Result Value   CO2 19 (*)    Glucose, Bld 122 (*)    BUN 5 (*)    Total Protein 6.3 (*)    Albumin 3.4 (*)    Total Bilirubin 1.5 (*)    All other components within normal limits  CBC WITH DIFFERENTIAL/PLATELET - Abnormal; Notable for the following:    WBC 11.6 (*)    Neutro Abs 8.5 (*)    All other components within normal limits  I-STAT CG4 LACTIC ACID, ED - Abnormal; Notable for the following:    Lactic Acid,  Venous 2.50 (*)    All other components within normal limits  URINALYSIS, ROUTINE W REFLEX MICROSCOPIC  I-STAT BETA HCG BLOOD, ED ([facility], [facility], [facility] ONLY)  I-STAT CG4 LACTIC ACID, ED    EKG  EKG Interpretation None       Radiology Dg Chest 2 View  Result Date: 11/16/2016 CLINICAL DATA:  Patient complains of ongoing rectal pain with swelling post hemorrhoid surgery with [facility] in office last week. Patient complains of nausea and chills with same EXAM: CHEST  2 VIEW COMPARISON:  None. FINDINGS: The heart size and mediastinal contours are within normal limits. Both lungs are clear. The visualized skeletal structures are unremarkable. IMPRESSION: No active cardiopulmonary disease. No evidence of pneumonia or pulmonary edema. Electronically Signed   By: Franki Cabot M.D.   On: 11/16/2016 12:33   Ct Pelvis W Contrast  Result Date: 11/16/2016 CLINICAL DATA:  Ongoing rectal pain with swelling post hemorrhoid surgery in the office last week. Nausea and chills. Question of rectal abscess. EXAM: CT PELVIS WITH CONTRAST TECHNIQUE: Multidetector CT imaging of the pelvis was performed using the standard protocol following the bolus administration of intravenous contrast. CONTRAST:  11mL ISOVUE-300 IOPAMIDOL (ISOVUE-300) INJECTION 61% COMPARISON:  CT of the  abdomen and pelvis on 09/23/2014 FINDINGS: Urinary Tract: The distal ureters are unremarkable in appearance. Urinary bladder is normal in appearance. Bowel: Surgical clips are seen in the region fall of the cecum, presumably following appendectomy. The region of the rectum is unremarkable in appearance. No significant edema or mass identified. Perirectal fat is normal in appearance. Vascular/Lymphatic: No pathologically enlarged lymph nodes. No significant vascular abnormality seen. Reproductive: Left ovarian follicle is 2.1 cm. Status post hysterectomy. Other: No free pelvic fluid. Anterior abdominal wall is unremarkable. Musculoskeletal: No suspicious bone lesions identified. IMPRESSION: 1. No evidence for perirectal mass or significant edema. 2. Status post appendectomy. 3. Status post hysterectomy. Electronically Signed   By: Nolon Nations M.D.   On: 11/16/2016 15:26    Procedures Procedures (including critical care time)  Medications Ordered in ED Medications  sodium chloride 0.9 % bolus 1,000 mL (not administered)  fentaNYL (SUBLIMAZE) injection 100 mcg (not administered)  fentaNYL (SUBLIMAZE) injection 100 mcg (100 mcg Intravenous Given 11/16/16 1430)  sodium chloride 0.9 % bolus 1,000 mL (1,000 mLs Intravenous New Bag/Given 11/16/16 1430)  iopamidol (ISOVUE-300) 61 % injection (100 mLs Intravenous Contrast Given 11/16/16 1445)     Initial Impression / Assessment and Plan / ED Course  I have reviewed the triage vital signs and the nursing notes.  Pertinent labs & imaging results that were available during my care of the patient were reviewed by me and considered in my medical decision making (see chart for details).     Patient with rectal pain post hemorrhoidectomy.  Done by Dr. Marcello Moores in the office.  It is been 6 days and still having severe pain.  Initially tachycardic and is improved somewhat with fluids.  White count minimally elevated.  Lactic acid initially mildly elevated but  has normalized.  Seen in the ER by Dr. Rosendo Gros from general surgery.  He reviewed the pictures with the patient brought in states it appears as if it is healing well.  States there is less inflammation and swelling and there was before.  CT scan done to look for abscess or other pathology in the area and it was reassuring. Patient had been on Suboxone.  States she has been off it for weeks but the drug database shows it is  still being filled.  Patient thinks it is either her ex-husband or her daughter that has been filling it and not giving it to her.  I think however with the Suboxone prescription filled I cannot give her pain medicines for home.  We will give another liter since she has had less oral intake.  Likely discharge home but care has been turned over to Dr. Cathleen Fears Final Clinical Impressions(s) / ED Diagnoses   Final diagnoses:  Postoperative pain    New Prescriptions New Prescriptions   POLYETHYLENE GLYCOL (MIRALAX / GLYCOLAX) PACKET    Take 17 g by mouth daily.     Davonna Belling, MD 11/16/16 [phone]

## 2016-11-16 NOTE — ED Triage Notes (Signed)
Patient complains of ongoing rectal pain with swelling post hemorrhoid surgery with [facility] in office last week. Patient complains of nausea and chills with same

## 2016-11-16 NOTE — Consult Note (Signed)
Reason for Consult: Hemorrhoids Referring Physician: Dr. Azzie Glatter [name] [name] [name] is an 36 y.o. [name] [name].  HPI: [name] [name] [name] is a 36 year old [name] [name] with a one-week history of hemorrhoids.  [name] [name] [name] states that hemorrhoids were likely due to extensive diarrhea.  [name] [name] [name] was recently seen approximately 1 week ago in the ER.  The [name] [name] [name] was then followed up with Dr. Marcello Moores in our office.  [name] [name] [name] underwent excision of thrombosed hemorrhoid.  [name] [name] [name] states that thereafter [name] [name] [name] continue with sits baths however the pain continued.  [name] [name] [name] states that [name] [name] [name] has had one bowel movement this past week.  [name] [name] [name] states there was minimal blood.  [name] [name] [name] comes in today secondary to continued pain. [name] [name] [name] underwent CT scan for evaluation of possible pelvic abscess.  CT results are as below.  I did review the CT scan myself.  Past Medical History:  Diagnosis Date  . Anxiety    klonipin for stress disorder  . Chronic back pain   . Degenerative disc disease   . Endometriosis    chronic pain  . GERD (gastroesophageal reflux disease)    protonix evenings  . Migraine     Past Surgical History:  Procedure Laterality Date  . ABDOMINAL HYSTERECTOMY    . APPENDECTOMY    . LAPAROSCOPIC ASSISTED VAGINAL HYSTERECTOMY  08/19/2011   Procedure: LAPAROSCOPIC ASSISTED VAGINAL HYSTERECTOMY;  Surgeon: Gus Height, MD;  Location: [facility];  Service: Gynecology;  Laterality: N/A;  . TONSILLECTOMY    . TUBAL LIGATION      Family History  Problem Relation Age of Onset  . Coronary artery disease Father   . Coronary artery disease Maternal Grandmother   . Breast cancer Mother   . Cancer Mother     Social History:  reports that [name] [name] [name] has been smoking Cigarettes.  [name] [name] [name] has a 14.00 pack-year smoking history. [name] [name] [name] has never used smokeless tobacco. [name] [name] [name] reports that [name] [name] [name] uses drugs, including Heroin. [name] [name] [name] reports that [name] [name] [name] does not drink alcohol.  Allergies:  Allergies  Allergen Reactions  . Epidural Tray 17gx3-1-2" [Nerve Block Tray] Other (See  Comments)    Paralysis and severe pain in head/neck/shoulders.  No anaphylaxis.  . Ibuprofen Hives  . Zofran [Ondansetron Hcl] Nausea And Vomiting  . Penicillins Hives    Has [name] [name] [name] had a PCN reaction causing immediate rash, facial/tongue/throat swelling, SOB or lightheadedness with hypotension: Yes Has [name] [name] [name] had a PCN reaction causing severe rash involving mucus membranes or skin necrosis: No Has [name] [name] [name] had a PCN reaction that required hospitalization No Has [name] [name] [name] had a PCN reaction occurring within the last 10 years: No If all of the above answers are "NO", then may proceed with Cephalosporin use.     Medications: I have reviewed the [name] [name] [name]'s current medications.  Results for orders placed or performed during the hospital encounter of 11/16/16 (from the past 48 hour(s))  Comprehensive metabolic panel     Status: Abnormal   Collection Time: 11/16/16 11:06 AM  Result Value Ref Range   Sodium 137 135 - 145 mmol/L   Potassium 4.4 3.5 - 5.1 mmol/L    Comment: HEMOLYSIS AT THIS LEVEL MAY AFFECT RESULT   Chloride 108 101 - 111 mmol/L   CO2 19 (L) 22 - 32 mmol/L   Glucose, Bld 122 (H) 65 - 99 mg/dL   BUN 5 (L) 6 - 20 mg/dL   Creatinine, Ser 0.82 0.44 - 1.00 mg/dL   Calcium 8.9 8.9 - 10.3 mg/dL   Total Protein 6.3 (L) 6.5 - 8.1 g/dL   Albumin 3.4 (L)  3.5 - 5.0 g/dL   AST 41 15 - 41 U/L   ALT 43 14 - 54 U/L   Alkaline Phosphatase 74 38 - 126 U/L   Total Bilirubin 1.5 (H) 0.3 - 1.2 mg/dL   GFR calc non Af Amer >60 >60 mL/min   GFR calc Af Amer >60 >60 mL/min    Comment: (NOTE) The eGFR has been calculated using the CKD EPI equation. This calculation has not been validated in all clinical situations. eGFR's persistently <60 mL/min signify possible Chronic Kidney Disease.    Anion gap 10 5 - 15  CBC with Differential     Status: Abnormal   Collection Time: 11/16/16 11:06 AM  Result Value Ref Range   WBC 11.6 (H) 4.0 - 10.5 K/uL   RBC 4.47 3.87 - 5.11 MIL/uL   Hemoglobin  13.6 12.0 - 15.0 g/dL   HCT 53.1 98.1 - 31.6 %   MCV 91.1 78.0 - 100.0 fL   MCH 30.4 26.0 - 34.0 pg   MCHC 33.4 30.0 - 36.0 g/dL   RDW 55.3 36.1 - 45.9 %   Platelets 309 150 - 400 K/uL   Neutrophils Relative % 73 %   Neutro Abs 8.5 (H) 1.7 - 7.7 K/uL   Lymphocytes Relative 22 %   Lymphs Abs 2.5 0.7 - 4.0 K/uL   Monocytes Relative 5 %   Monocytes Absolute 0.6 0.1 - 1.0 K/uL   Eosinophils Relative 0 %   Eosinophils Absolute 0.0 0.0 - 0.7 K/uL   Basophils Relative 0 %   Basophils Absolute 0.0 0.0 - 0.1 K/uL  I-Stat beta hCG blood, ED     Status: None   Collection Time: 11/16/16 11:22 AM  Result Value Ref Range   I-stat hCG, quantitative <5.0 <5 mIU/mL   Comment 3            Comment:   GEST. AGE      CONC.  (mIU/mL)   <=1 WEEK        5 - 50     2 WEEKS       50 - 500     3 WEEKS       100 - 10,000     4 WEEKS     1,000 - 30,000        [name] [name] AND NON-PREGNANT [name] [name]:     LESS THAN 5 mIU/mL   I-Stat CG4 Lactic Acid, ED     Status: Abnormal   Collection Time: 11/16/16 11:24 AM  Result Value Ref Range   Lactic Acid, Venous 2.50 (HH) 0.5 - 1.9 mmol/L   Comment NOTIFIED PHYSICIAN   I-Stat CG4 Lactic Acid, ED     Status: None   Collection Time: 11/16/16  1:30 PM  Result Value Ref Range   Lactic Acid, Venous 1.85 0.5 - 1.9 mmol/L    Dg Chest 2 View  Result Date: 11/16/2016 CLINICAL DATA:  [name] [name] [name] complains of ongoing rectal pain with swelling post hemorrhoid surgery with [facility] in office last week. [name] [name] [name] complains of nausea and chills with same EXAM: CHEST  2 VIEW COMPARISON:  None. FINDINGS: The heart size and mediastinal contours are within normal limits. Both lungs are clear. The visualized skeletal structures are unremarkable. IMPRESSION: No active cardiopulmonary disease. No evidence of pneumonia or pulmonary edema. Electronically Signed   By: Bary Richard M.D.   On: 11/16/2016 12:33   Ct Pelvis W Contrast  Result Date: 11/16/2016 CLINICAL DATA:  Ongoing rectal pain with  swelling  post hemorrhoid surgery in the office last week. Nausea and chills. Question of rectal abscess. EXAM: CT PELVIS WITH CONTRAST TECHNIQUE: Multidetector CT imaging of the pelvis was performed using the standard protocol following the bolus administration of intravenous contrast. CONTRAST:  117m ISOVUE-300 IOPAMIDOL (ISOVUE-300) INJECTION 61% COMPARISON:  CT of the abdomen and pelvis on 09/23/2014 FINDINGS: Urinary Tract: The distal ureters are unremarkable in appearance. Urinary bladder is normal in appearance. Bowel: Surgical clips are seen in the region fall of the cecum, presumably following appendectomy. The region of the rectum is unremarkable in appearance. No significant edema or mass identified. Perirectal fat is normal in appearance. Vascular/Lymphatic: No pathologically enlarged lymph nodes. No significant vascular abnormality seen. Reproductive: Left ovarian follicle is 2.1 cm. Status post hysterectomy. Other: No free pelvic fluid. Anterior abdominal wall is unremarkable. Musculoskeletal: No suspicious bone lesions identified. IMPRESSION: 1. No evidence for perirectal mass or significant edema. 2. Status post appendectomy. 3. Status post hysterectomy. Electronically Signed   By: ENolon NationsM.D.   On: 11/16/2016 15:26    Review of Systems  Constitutional: Negative for chills, fever and malaise/fatigue.  HENT: Negative for ear discharge, hearing loss and sore throat.   Eyes: Negative for blurred vision and discharge.  Respiratory: Negative for cough and shortness of breath.   Cardiovascular: Negative for chest pain, orthopnea and leg swelling.  Gastrointestinal: Positive for blood in stool. Negative for abdominal pain, constipation, diarrhea, heartburn, nausea and vomiting.  Musculoskeletal: Negative for myalgias and neck pain.  Skin: Negative for itching and rash.  Neurological: Negative for dizziness, focal weakness, seizures and loss of consciousness.  Endo/Heme/Allergies:  Negative for environmental allergies. Does not bruise/bleed easily.  Psychiatric/Behavioral: Negative for depression and suicidal ideas.  All other systems reviewed and are negative.  Blood pressure 122/89, pulse (!) 111, temperature 98.3 F (36.8 C), temperature source Oral, resp. rate 15, height '5\' 2"'$  (1.575 m), weight 86.2 kg (190 lb), last menstrual period 08/10/2011, SpO2 100 %. Physical Exam  Constitutional: [name] [name] [name] is oriented to person, place, and time. Vital signs are normal. [name] [name] [name] appears well-developed and well-nourished.  Conversant No acute distress  Eyes: Lids are normal. No scleral icterus.  No lid lag Moist conjunctiva  Neck: No tracheal tenderness present. No thyromegaly present.  No cervical lymphadenopathy  Cardiovascular: Normal rate, regular rhythm and intact distal pulses.   No murmur heard. Respiratory: Effort normal and breath sounds normal. [name] [name] [name] has no wheezes. [name] [name] [name] has no rales.  GI: Soft. Bowel sounds are normal. [name] [name] [name] exhibits no mass. There is no hepatosplenomegaly. There is no tenderness. There is no rebound and no guarding. No hernia.  Genitourinary:     Neurological: [name] [name] [name] is alert and oriented to person, place, and time.  Normal gait and station  Skin: Skin is warm. No rash noted. No cyanosis. Nails show no clubbing.  Normal skin turgor  Psychiatric: Judgment normal.  Appropriate affect    Assessment/Plan: [name] [name] [name] with resolving thrombosed hemorrhoid.    1.  At this time I do not see any need for any intervention. 2.  I recommend Anusol suppository versus cream. 3.  Would recommend lidocaine ointment and continue sitz baths. 4.  [name] [name] [name] can follow-up with [place].  RRosario [name] [name], [name] [name] [name] 11/16/2016, 3:55 PM

## 2016-11-16 NOTE — ED Notes (Signed)
Removed pt's IV, per Dr. Winfred Leeds.

## 2016-11-16 NOTE — Discharge Instructions (Addendum)
[facility] see if [facility] is anything else he can do to help with the pain.  Tylenol may help with the pain.  Continue the lidocaine ointment and the use of suppositories or cream.  Take the MiraLAX to help keep your stool soft.

## 2016-11-16 NOTE — ED Provider Notes (Addendum)
5:30 PM patient awake alert [place] Coma Score 15 continues to complain of pain at rectum.  Reports that she has Anusol suppositories and lidocaine ointment at home.  Which she is urged to continue.  She is advised to call [facility] tomorrow for follow-up.  She was prescribed MiraLAX by Dr. Alvino Chapel patient requested discharge after 1 L of IV fluid infused.  Will discharge patient.  Of note patient reportedly had prescription for Suboxone filled 3 days ago.   Orlie Dakin, MD 11/16/16 1737    Orlie Dakin, MD 11/16/16 [phone]

## 2016-11-16 NOTE — ED Notes (Signed)
Pt ambulated w/o assistance to the bathroom. 

## 2016-11-30 ENCOUNTER — Emergency Department (HOSPITAL_COMMUNITY)
Admission: EM | Admit: 2016-11-30 | Discharge: 2016-11-30 | Disposition: A | Discharge status: Home / Self Care | Payer: Medicaid Other | Attending: Emergency Medicine | Admitting: Emergency Medicine

## 2016-11-30 ENCOUNTER — Encounter (HOSPITAL_COMMUNITY): Payer: Self-pay

## 2016-11-30 DIAGNOSIS — S20369A Insect bite (nonvenomous) of unspecified front wall of thorax, initial encounter: Secondary | ICD-10-CM | POA: Insufficient documentation

## 2016-11-30 DIAGNOSIS — R21 Rash and other nonspecific skin eruption: Secondary | ICD-10-CM | POA: Diagnosis present

## 2016-11-30 DIAGNOSIS — Y92009 Unspecified place in unspecified non-institutional (private) residence as the place of occurrence of the external cause: Secondary | ICD-10-CM | POA: Diagnosis not present

## 2016-11-30 DIAGNOSIS — Z79811 Long term (current) use of aromatase inhibitors: Secondary | ICD-10-CM | POA: Insufficient documentation

## 2016-11-30 DIAGNOSIS — Y939 Activity, unspecified: Secondary | ICD-10-CM | POA: Insufficient documentation

## 2016-11-30 DIAGNOSIS — S80861A Insect bite (nonvenomous), right lower leg, initial encounter: Secondary | ICD-10-CM | POA: Insufficient documentation

## 2016-11-30 DIAGNOSIS — S80862A Insect bite (nonvenomous), left lower leg, initial encounter: Secondary | ICD-10-CM | POA: Insufficient documentation

## 2016-11-30 DIAGNOSIS — Y999 Unspecified external cause status: Secondary | ICD-10-CM | POA: Insufficient documentation

## 2016-11-30 DIAGNOSIS — F17210 Nicotine dependence, cigarettes, uncomplicated: Secondary | ICD-10-CM | POA: Insufficient documentation

## 2016-11-30 DIAGNOSIS — W57XXXA Bitten or stung by nonvenomous insect and other nonvenomous arthropods, initial encounter: Secondary | ICD-10-CM | POA: Diagnosis not present

## 2016-11-30 DIAGNOSIS — Z79899 Other long term (current) drug therapy: Secondary | ICD-10-CM | POA: Diagnosis not present

## 2016-11-30 DIAGNOSIS — S20469A Insect bite (nonvenomous) of unspecified back wall of thorax, initial encounter: Secondary | ICD-10-CM | POA: Diagnosis not present

## 2016-11-30 DIAGNOSIS — Z791 Long term (current) use of non-steroidal anti-inflammatories (NSAID): Secondary | ICD-10-CM | POA: Insufficient documentation

## 2016-11-30 DIAGNOSIS — Z880 Allergy status to penicillin: Secondary | ICD-10-CM | POA: Insufficient documentation

## 2016-11-30 MED ORDER — HYDROXYZINE HCL 25 MG PO TABS
50.0000 mg | ORAL_TABLET | Freq: Once | ORAL | Status: AC
Start: 2016-11-30 — End: 2016-11-30
  Administered 2016-11-30: 50 mg via ORAL
  Filled 2016-11-30: qty 2

## 2016-11-30 MED ORDER — PERMETHRIN 5 % EX CREA: TOPICAL_CREAM | CUTANEOUS | 0 refills | Status: DC

## 2016-11-30 MED ORDER — HYDROXYZINE HCL 25 MG PO TABS: 25.0000 mg | ORAL_TABLET | Freq: Four times a day (QID) | ORAL | 0 refills | Status: DC

## 2016-11-30 NOTE — ED Provider Notes (Signed)
[facility] DEPT Provider Note   CSN: [id number] Arrival date & time: 11/30/16  0030     History   Chief Complaint Chief Complaint  Patient presents with  . Insect Bite    HPI [name] [name] is a 36 y.o. female.  HPI  This is a 36 year old female who presents with rash and concerns for scabies.  Patient reports that she and her husband visited a friend's house twice.  Since that time she has noted multiple red itchy spots over her chest, back, and bilateral lower legs.  She states "my husband pulled out worms."  They spoke with their friend who stated that he knew there was an infestation of scabies and felt that they had infested his home.  Her husband does not have any lesions.  Onset of symptoms was after visiting her friend's house.  She denies any fevers or systemic symptoms.  She has not taken anything for her symptoms.  She is very anxious regarding her rash.  It is itchy but not painful.  Past Medical History:  Diagnosis Date  . Anxiety    klonipin for stress disorder  . Chronic back pain   . Degenerative disc disease   . Endometriosis    chronic pain  . GERD (gastroesophageal reflux disease)    protonix evenings  . Migraine     Patient Active Problem List   Diagnosis Date Noted  . Severe episode of recurrent major depressive disorder, without psychotic features ([place])   . MDD (major depressive disorder), recurrent severe, without psychosis ([place]) 09/22/2014  . Opioid use disorder, severe, dependence ([place]) 09/22/2014  . Hypokalemia 09/22/2014  . Tylenol overdose 09/19/2014  . Suicide attempt ([place]) 09/19/2014  . GERD (gastroesophageal reflux disease) 09/19/2014  . Drug dependence ([place])   . Polysubstance (including opioids) dependence with physiological dependence ([place]) 07/08/2013  . Suicidal ideation 07/06/2013  . Nausea & vomiting 07/06/2013  . Chronic pain disorder 07/06/2013  . Cocaine abuse ([place]) 07/06/2013  . Major depression  01/05/2012  . Alcohol dependence ([place]) 01/04/2012  . Opioid dependence ([place]) 12/29/2011  . Back pain 12/29/2011  . N&V (nausea and vomiting) 06/21/2011  . Tachycardia 06/21/2011  . Abdominal pain 06/21/2011  . Leukocytosis 06/21/2011    Past Surgical History:  Procedure Laterality Date  . ABDOMINAL HYSTERECTOMY    . APPENDECTOMY    . TONSILLECTOMY    . TUBAL LIGATION      OB History    Gravida Para Term Preterm AB Living   6 2           SAB TAB Ectopic Multiple Live Births                   Home Medications    Prior to Admission medications   Medication Sig Start Date End Date Taking? Authorizing Provider  clonazePAM (KLONOPIN) 1 MG tablet Take 1 mg by mouth 2 (two) times daily. 11/05/16   [provider]  esomeprazole (NEXIUM) 40 MG capsule Take 40 mg by mouth daily. 10/10/16   [provider]  hydrocerin (EUCERIN) CREA Apply 1 application topically 2 (two) times daily. Patient not taking: Reported on 11/10/2016 05/28/15   Derrill Center, NP  hydrocortisone (ANUSOL-HC) 2.5 % rectal cream Apply rectally 2 times daily 11/10/16   Larene Pickett, PA-C  hydrOXYzine (ATARAX/VISTARIL) 25 MG tablet Take 1 tablet (25 mg total) every 6 (six) hours by mouth. 11/30/16   Shlomie Romig, Barbette Hair, MD  lamoTRIgine (LAMICTAL) 100 MG  tablet Take 100 mg by mouth daily. 10/10/16   [provider]  lidocaine (XYLOCAINE) 2 % jelly Apply 1 application topically as needed. 11/10/16   Ward, Ozella Almond, PA-C  pantoprazole (PROTONIX) 20 MG tablet Take 1 tablet (20 mg total) by mouth daily. Patient not taking: Reported on 11/10/2016 05/28/15   Derrill Center, NP  permethrin (ELIMITE) 5 % cream Apply to affected area once; apply from the neck down overnight for approximately 8 hours, rinse thoroughly.  May re-dose in 1 week. 11/30/16   Caelin Rayl, Barbette Hair, MD  polyethylene glycol (MIRALAX / GLYCOLAX) packet Take 17 g by mouth daily. 11/16/16   Davonna Belling, MD  SUBOXONE 8-2  MG FILM Place 1 Film under the tongue 3 (three) times daily. 11/05/16   [provider]  topiramate (TOPAMAX) 100 MG tablet Take 100 mg by mouth at bedtime. 10/09/16   [provider]  traZODone (DESYREL) 100 MG tablet Take 1 tablet (100 mg total) by mouth at bedtime as needed and may repeat dose one time if needed for sleep. Patient not taking: Reported on 11/10/2016 05/28/15   Derrill Center, NP  VYVANSE 70 MG capsule Take 70 mg by mouth daily after breakfast. 11/07/16   [provider]    Family History Family History  Problem Relation Age of Onset  . Coronary artery disease Father   . Coronary artery disease Maternal Grandmother   . Breast cancer Mother   . Cancer Mother     Social History Social History   Tobacco Use  . Smoking status: Current Every Day Smoker    Packs/day: 1.00    Years: 14.00    Pack years: 14.00    Types: Cigarettes  . Smokeless tobacco: Never Used  Substance Use Topics  . Alcohol use: No  . Drug use: Yes    Types: Heroin    Comment: denies     Allergies   Epidural tray 17gx3-1-2" [nerve block tray]; Ibuprofen; Zofran [ondansetron hcl]; and Penicillins   Review of Systems Review of Systems  Constitutional: Negative for fever.  Skin: Positive for color change and rash. Negative for wound.  All other systems reviewed and are negative.    Physical Exam Updated Vital Signs BP (!) 128/91 (BP Location: Left Arm)   Pulse (!) 125   Temp 99.1 F (37.3 C) (Oral)   Resp 18   Ht 5\' 5"  (1.651 m)   Wt 86.2 kg (190 lb)   LMP 08/10/2011   SpO2 96%   BMI 31.62 kg/m   Physical Exam  Constitutional: She is oriented to person, place, and time.  Obese, anxious appearing, no acute distress  HENT:  Head: Normocephalic and atraumatic.  Cardiovascular: Normal rate and regular rhythm.  Pulmonary/Chest: Effort normal. No respiratory distress.  Neurological: She is alert and oriented to person, place, and time.  Skin: Skin is  warm and dry.  Multiple erythematous excoriated patches over the chest, lower back, bilateral lower extremities, no pustules noted, overlying excoriations noted without evidence of super imposed infection.  Psychiatric: She has a normal mood and affect.  Nursing note and vitals reviewed.    ED Treatments / Results  Labs (all labs ordered are listed, but only abnormal results are displayed) Labs Reviewed - No data to display  EKG  EKG Interpretation None       Radiology No results found.  Procedures Procedures (including critical care time)  Medications Ordered in ED Medications  hydrOXYzine (ATARAX/VISTARIL) tablet 50 mg (not  administered)     Initial Impression / Assessment and Plan / ED Course  I have reviewed the triage vital signs and the nursing notes.  Pertinent labs & imaging results that were available during my care of the patient were reviewed by me and considered in my medical decision making (see chart for details).     She presents with rash and concerns for scabies infestation.  She does have an erythematous patchy rash with excoriations likely secondary to itching.  It does not appear cellulitic or infected.  Given known infestation, will treat with permethrin.  Patient given hydroxyzine for both anxiety related to her rash and itching.  Discussed with the patient and her husband washing all linens and clothes in hot water.  After history, exam, and medical workup I feel the patient has been appropriately medically screened and is safe for discharge home. Pertinent diagnoses were discussed with the patient. Patient was given return precautions.   Final Clinical Impressions(s) / ED Diagnoses   Final diagnoses:  Rash  Insect bite, initial encounter    ED Discharge Orders        Ordered    permethrin (ELIMITE) 5 % cream     11/30/16 0125    hydrOXYzine (ATARAX/VISTARIL) 25 MG tablet  Every 6 hours     11/30/16 0125       Merryl Hacker,  MD 11/30/16 0130

## 2016-11-30 NOTE — ED Notes (Signed)
Bed: [facility] Expected date:  Expected time:  Means of arrival:  Comments: 

## 2016-11-30 NOTE — ED Triage Notes (Signed)
For 48 hours states bugs are crawling all over her unable to see any bugs in triage.

## 2016-11-30 NOTE — Discharge Instructions (Signed)
You were seen today for a rash.  This could be scabies.  You will be discharged with permethrin and Atarax for itching.  If he develops significant increasing redness, fevers or any new or worsening symptoms you should be reevaluated.

## 2017-02-25 ENCOUNTER — Telehealth: Payer: Self-pay | Admitting: *Deleted

## 2017-02-25 NOTE — Telephone Encounter (Signed)
Pt calls and states her clinic has closed, they were monitoring her opioid addiction, prescribing suboxone She has been seeing them for 7 months- since she was released from prison, while incarcerated she was not on suboxone before being incarcerated she had been on suboxone for 1 year The clinic was [facility], she did not go in regularly, states they would either give her weekly or biweekly scripts and sometimes it would be as much as a month between visits States she is on a "lockdown" program for medicaid [id number], they will need to be notified by new prescriber She has been stretching her suboxone out because she was afraid she would run out. She has some chronic back pain, disc disease and had some nerve damage from steroid injections that increase her pain levels greatly. She states she "snorted" a small amt of heroin yesterday due to her pain level and decreasing her suboxone. States no one else in her home uses illicit drugs She was hoping to [facility] today but when she told them that her urine was going to be dirty they told her they would not see her She has transportation available [facility] Would like to be seen asap if accepted Sending note to dr's mullen, vincent and hoffman Pt's ph# [phone] Could we see her tomorrow am in [facility] if she is accepted?

## 2017-02-25 NOTE — Telephone Encounter (Signed)
appt 0915 [facility] 2/7

## 2017-02-25 NOTE — Telephone Encounter (Signed)
Discussed with Bonnita Nasuti, will have patient seen in [facility] tomorrow.

## 2017-02-26 ENCOUNTER — Other Ambulatory Visit: Payer: Self-pay

## 2017-02-26 ENCOUNTER — Ambulatory Visit (INDEPENDENT_AMBULATORY_CARE_PROVIDER_SITE_OTHER): Payer: Medicaid Other | Admitting: Internal Medicine

## 2017-02-26 ENCOUNTER — Encounter: Payer: Self-pay | Admitting: Internal Medicine

## 2017-02-26 VITALS — BP 116/81 | HR 104 | Temp 98.2°F | Ht 62.0 in | Wt 195.6 lb

## 2017-02-26 DIAGNOSIS — F1120 Opioid dependence, uncomplicated: Secondary | ICD-10-CM

## 2017-02-26 MED ORDER — SUBOXONE 8-2 MG SL FILM: 1.0000 | ORAL_FILM | Freq: Three times a day (TID) | SUBLINGUAL | 0 refills | Status: DC

## 2017-02-26 NOTE — Patient Instructions (Addendum)
[name] [name],   Please schedule a follow up visit in the acute care clinic next Thursday 2/14.  We will call you if there is any concern on the lab work that we are collecting today.   It was a pleasure to work with you today, please call the clinic if you have any questions or concerns.

## 2017-02-26 NOTE — Progress Notes (Signed)
02/26/2017  [name] [name] presents for buprenorphine/naloxone intake visit.   I have reviewed EPIC data including labwork which was available.  I have reviewed outside records provided by patient if available.  The salient points were confirmed with the patient.    Review of substance use history (first use, substances used, any illicit purchases): Hx of degenerative disk and bulging disk. Started taking percocet 10 years ago and worked up to the point of taking 3 x oxycontin 80 mg daily and oxycodone IR 30 mg daily. Felt that she worked up a tolerance and pain was not being controlled. Four years ago she started injecting heroine, this lasted two years, spent 100$ daily. Two and a half years ago physician and patient decided to start suboxone, [facility]. She was on opioid replacement treatment for one and a half years. She stopped while incarcerated for one year because she wasn't offered this option. She has been back on suboxone for one year. She takes buprenorphine 24 mg daily. Her last relapse was five months ago, injected heroin. [facility] two weeks ago, she began running out of strips two days ago and has been using buprenorphine 12 mg daily for the past two days in an attempt not to run out of films. Two days ago she was having a lot of pain, worried about using up all of her films before finding a new provider so she used heroin. Says in the past she has been incarcerated for stealing in order to afford the suboxone. She was getting suboxone for $3 per prescription through medicaid. Has financial help from family and boyfriend is getting a job. Takes vyvanse for ADHD [facility] and [facility].   Last substance used: snorted Heroin, two days ago   If last substance not an opioid, last opioid used (type, dose, route, withdrawal symptoms): none this week but drug test yesterday had cocaine, she thinks the heroine was  contaminated    Mental Health History: ADHD ( on vyvanse) , depression ( on lexapro, topamax, and clonipine), Insomnia, has been hospitalized for suicidal ideations in the past, last was two years ago   Current counseling/behavioural health provider: none at this time, was [facility]   This patient has Opioid Use Disorder by following DSM-V criteria: Keep those that apply.  - Opioids taken in larger amounts or over a longer period than intended - Persistent desire to cut down - A great deal of time is spent to obtain/use/recover from the opioid - Cravings to use opioids - Use resulting in a failure to fulfill major role obligations - Continue opioid use despite persistent social or interpersonal problems - Important activities are given up or reduced because of opioid use - Recurrent opioid use in situations in which it is physically hazardous - Use despite knowledge of health problems caused by opioids - Tolerance - Withdrawal  Past Medical History:  Diagnosis Date  . Anxiety    klonipin for stress disorder  . Chronic back pain   . Degenerative disc disease   . Endometriosis    chronic pain  . GERD (gastroesophageal reflux disease)    protonix evenings  . Migraine    Family History  Problem Relation Age of Onset  . Coronary artery disease Father   . Coronary artery disease Maternal Grandmother   . Breast cancer Mother   . Cancer Mother    Social History  Socioeconomic History  . Marital status: Divorced    Spouse name: Not on file  . Number of children: Not on file  . Years of education: Not on file  . Highest education level: Not on file  Social Needs  . Financial resource strain: Not on file  . Food insecurity - worry: Not on file  . Food insecurity - inability: Not on file  . Transportation needs - medical: Not on file  . Transportation needs - non-medical: Not on file  Occupational History  . Not on file  Tobacco Use  . Smoking  status: Current Every Day Smoker    Packs/day: 1.00    Years: 14.00    Pack years: 14.00    Types: Cigarettes  . Smokeless tobacco: Never Used  Substance and Sexual Activity  . Alcohol use: No  . Drug use: Yes    Types: Heroin    Comment: denies  . Sexual activity: Yes  Other Topics Concern  . Not on file  Social History Narrative  . Not on file    Current Outpatient Medications on File Prior to Visit  Medication Sig Dispense Refill  . clonazePAM (KLONOPIN) 1 MG tablet Take 1 mg by mouth 2 (two) times daily.  1  . esomeprazole (NEXIUM) 40 MG capsule Take 40 mg by mouth daily.  1  . hydrocerin (EUCERIN) CREA Apply 1 application topically 2 (two) times daily. (Patient not taking: Reported on 11/10/2016) 1 g 0  . hydrocortisone (ANUSOL-HC) 2.5 % rectal cream Apply rectally 2 times daily 30 g 0  . hydrOXYzine (ATARAX/VISTARIL) 25 MG tablet Take 1 tablet (25 mg total) every 6 (six) hours by mouth. 12 tablet 0  . lamoTRIgine (LAMICTAL) 100 MG tablet Take 100 mg by mouth daily.  1  . lidocaine (XYLOCAINE) 2 % jelly Apply 1 application topically as needed. 10 mL 0  . pantoprazole (PROTONIX) 20 MG tablet Take 1 tablet (20 mg total) by mouth daily. (Patient not taking: Reported on 11/10/2016) 30 tablet 0  . permethrin (ELIMITE) 5 % cream Apply to affected area once; apply from the neck down overnight for approximately 8 hours, rinse thoroughly.  May re-dose in 1 week. 60 g 0  . polyethylene glycol (MIRALAX / GLYCOLAX) packet Take 17 g by mouth daily. 14 each 0  . SUBOXONE 8-2 MG FILM Place 1 Film under the tongue 3 (three) times daily.  0  . topiramate (TOPAMAX) 100 MG tablet Take 100 mg by mouth at bedtime.  2  . traZODone (DESYREL) 100 MG tablet Take 1 tablet (100 mg total) by mouth at bedtime as needed and may repeat dose one time if needed for sleep. (Patient not taking: Reported on 11/10/2016) 30 tablet 0  . VYVANSE 70 MG capsule Take 70 mg by mouth daily after breakfast.  0   No  current facility-administered medications on file prior to visit.     Physical Exam  Vitals:   02/26/17 0918  BP: 116/81  Pulse: (!) 104  Temp: 98.2 F (36.8 C)  TempSrc: Oral  SpO2: 98%  Weight: 195 lb 9.6 oz (88.7 kg)  Height: 5\' 2"  (1.575 m)   General: appears uncomfortable, no acute distress  Cardiac: tachycardic, regular rhythm, no appreciable murmur  Pulm: lungs clear to auscultation b/l, no respiratory distress  GI: soft, non tender, non distended     Clinical Opiate Withdrawal Scale: bold applicable COWS scoring   - Resting HR:    - 0 for < 80   -  1 for 81 - 100   - 2 for 101 - 120   - 4 for > 120  - Sweating:   - 0 for no chills/flushing   - 1 for subjective chills/flushing   - 3 for beads of sweat on brow/face   - 4 for sweat streaming off of face  - Restlessness:    - 0 for able to sit still   - 1 for subjective difficulty sitting still   - 3 for frequent shifting or extraneous movement   - 5 for unable to sit still for more than a few seconds  - Pupil size:    - 0 for pinpoint or normal   - 1 for possibly larger than normal   - 2 for moderately dilated   - 5 for only iris rim visible  - Bone/joint pain:    - 0 for not present    - 1 for mild diffuse discomfort   - 2 severe diffuse aching   - 4 for objectively rubbing joints/muscles and obviously in pain  - Runny nose/tearing:    - 0 for not present   - 1 for stuffy nose/moist eyes   - 2 for nose running/tearing   - 4 for nose constantly running or tears streaming down cheeks  - GI Upset:    - 0 for no GI symptoms   - 1 for stomach cramps   - 2 for nausea or loose stool   - 3 for vomiting or diarrhea   - 5 for multiple episodes of vomiting or diarrhea  - Tremor observation of outstretched hands:    - 0 for no tremor   - 1 for tremor can be felt but not observed   - 2 for slight tremor observable   - 4 for gross tremor or muscle twitching  - Yawning:    - 0 for no yawning   - 1 for yawning  once or twice during assessment   - 2 for yawning three or more times during assessment   - 4 for yawning several times per minute  - Anxiety or irritability:    - 0 for none   - 1 for patient reports increasing irritability or anxiousness   - 2 for patient obviously irritable/anxious   - 4 for patient so irritable/anxious that assessment is difficult  - Gooseflesh:    - 0 for skin is smooth   - 3 for piloerection of skin can be felt or seen   - 5 for prominent piloerection  TOTAL: 7, mild withdrawal   Assessment/Plan:   Based on a review of the patient's medical history including substance use and mental health factors, and physical exam, Fate Galanti is a suitable candidate for MAT with buprenorphine/naloxone.  UDS ordered this visit.    I have discussed HIV, syphilis and Hepatitis C screening with this patient.    I will place orders for CMET for baseline LFT evaluation if needed.    - Sent a prescription for 24 mg daily films to the [place] on [address]  - Dr. Evette Doffing has contacted her Medicaid lock in program to notify them that we will be prescribing her opioid replacement therapy   Home Induction:   I have instructed the patient how to appropriately take this medication, including placing under the tongue with head relaxed for 10 minutes and allowing to dissolve without chewing or swallowing tab/film, and with nothing to eat or drink in the subsequent 15 minutes.  They have been told not to start taking the medication until they have significant signs of withdrawal and I have explained the concept of precipitated withdrawal with the patient.    Intervisit Care:  I will call the patient the morning after induction to assess symptom burden and determine the need for additional dose titration.  We discussed this medication must be kept in a safe place and away from children.   We will see the patient back in 1 week in clinic, with options for a sooner appointment  based on patient and provider preference.   Patient was encouraged to call the office and speak with the MD on call for any urgent concerns.    Ledell Noss, MD 02/26/2017 9:19 AM

## 2017-02-27 ENCOUNTER — Encounter: Payer: Self-pay | Admitting: Internal Medicine

## 2017-02-27 LAB — CMP14 + ANION GAP
ALK PHOS: 80 IU/L (ref 39–117)
ALT: 30 IU/L (ref 0–32)
AST: 19 IU/L (ref 0–40)
Albumin/Globulin Ratio: 1.4 (ref 1.2–2.2)
Albumin: 4.1 g/dL (ref 3.5–5.5)
Anion Gap: 16 mmol/L (ref 10.0–18.0)
BUN/Creatinine Ratio: 14 (ref 9–23)
BUN: 10 mg/dL (ref 6–20)
CALCIUM: 9.2 mg/dL (ref 8.7–10.2)
CHLORIDE: 100 mmol/L (ref 96–106)
CO2: 22 mmol/L (ref 20–29)
Creatinine, Ser: 0.7 mg/dL (ref 0.57–1.00)
GFR calc Af Amer: 127 mL/min/{1.73_m2} (ref >59)
GFR calc non Af Amer: 110 mL/min/{1.73_m2} (ref >59)
GLUCOSE: 111 mg/dL — AB (ref 65–99)
Globulin, Total: 2.9 g/dL (ref 1.5–4.5)
POTASSIUM: 3.9 mmol/L (ref 3.5–5.2)
Sodium: 138 mmol/L (ref 134–144)
Total Protein: 7 g/dL (ref 6.0–8.5)

## 2017-02-27 LAB — HEPATITIS C ANTIBODY

## 2017-02-27 LAB — RPR: RPR: NONREACTIVE

## 2017-02-27 LAB — HIV ANTIBODY (ROUTINE TESTING W REFLEX): HIV SCREEN 4TH GENERATION: NONREACTIVE

## 2017-02-27 NOTE — Progress Notes (Signed)
Internal Medicine Clinic Attending  I saw and evaluated the patient.  I personally confirmed the key portions of the history and exam documented by Dr. Blum and I reviewed pertinent patient test results.  The assessment, diagnosis, and plan were formulated together and I agree with the documentation in the resident's note. 

## 2017-02-27 NOTE — Addendum Note (Signed)
Addended by: Lalla Brothers T on: 02/27/2017 03:30 PM   Modules accepted: Orders

## 2017-02-27 NOTE — Assessment & Plan Note (Addendum)
37 year old person with opioid use disorder here seeking help with continuing suboxone therapy. She has done well over the last year on suboxone, rare relapse she admits, through the [facility] in [place]. It appears this clinic abruptly closed about a month ago, and [facility] did not have a transition plan in place. She reports decreasing Suboxone dosage as a result, then had mild withdrawal symptoms, and then started snorting heroin to treat that withdrawal. She clearly meets DSM5 criteria for severe OUD, and is a good candidate to continue suboxone. I reviewed the controlled database today, collected a urine tox screen, and we signed a treatment agreement together. CMET, HIV, and HCV testing are normal. She is prescribed a low dose of clonazepam by a local psychiatrist which I think is fine. I reviewed with her the risk of precipitated withdrawal when restarting suboxone, she understands. I electronically prescribed Suboxone 24mg  daily, which is her previous dose last filled in January according to the database. Plan to follow up with me in one week to check on her progress.

## 2017-03-04 LAB — TOXASSURE SELECT,+ANTIDEPR,UR

## 2017-03-05 ENCOUNTER — Encounter: Payer: Self-pay | Admitting: Internal Medicine

## 2017-03-05 ENCOUNTER — Other Ambulatory Visit: Payer: Self-pay | Admitting: *Deleted

## 2017-03-05 ENCOUNTER — Ambulatory Visit (INDEPENDENT_AMBULATORY_CARE_PROVIDER_SITE_OTHER): Payer: Medicaid Other | Admitting: Internal Medicine

## 2017-03-05 VITALS — BP 131/75 | HR 102 | Temp 98.4°F | Ht 62.0 in | Wt 194.2 lb

## 2017-03-05 DIAGNOSIS — F1120 Opioid dependence, uncomplicated: Secondary | ICD-10-CM | POA: Diagnosis not present

## 2017-03-05 DIAGNOSIS — Z9114 Patient's other noncompliance with medication regimen: Secondary | ICD-10-CM

## 2017-03-05 MED ORDER — SUBOXONE 8-2 MG SL FILM: 1.0000 | ORAL_FILM | Freq: Three times a day (TID) | SUBLINGUAL | 0 refills | Status: DC

## 2017-03-05 NOTE — Progress Notes (Signed)
   03/05/2017  [name] [name] presents for follow up of opioid use disorder I have reviewed the prior induction visit, follow up visits, and telephone encounters relevant to opiate use disorder (OUD) treatment.   Current daily dose: Suboxone 24 mg daily   Date of Induction: 02/26/2017  Current follow up interval, in weeks: 1   The patient has not been adherent with the buprenorphine for OUD contract.   Last UDS Result: clonazepam metabolite, buprenorphine metabolites, oxazepam, cocaine metabolite, morphine, oxycodone, tramadol, and citalopram   HPI: Patient had an uncomplicated restart of suboxone. She was able to pick up the suboxone films at an affordable price through the discount program. She slipped in the shower and had back pain and took two of her neighbors percocet. She denies any illicit drug use such as marijuana or cocaine.  Exam:   Vitals:   03/05/17 1402  BP: 131/75  Pulse: (!) 102  Temp: 98.4 F (36.9 C)  TempSrc: Oral  SpO2: 100%  Weight: 194 lb 3.2 oz (88.1 kg)  Height: 5\' 2"  (1.575 m)    General: no acute distress  Cardiac: Regular Rate Rhythm, no peripheral edema   Pulm: no respiratory distress, lungs clear to auscultation   Assessment/Plan:  See Problem Based Charting in the Encounters Tab  -Refilled suboxone 24 mg daily  -UDS today  -return for follow up Tuesday 2/19   Ledell Noss, MD  03/05/2017  2:44 PM

## 2017-03-05 NOTE — Patient Instructions (Signed)
It was a pleasure to see you today [name] [name]   Keep up the good work!   Schedule your follow up on Tuesday 2/19.   Call the clinic if you need anything

## 2017-03-08 ENCOUNTER — Encounter: Payer: Self-pay | Admitting: Internal Medicine

## 2017-03-09 NOTE — Progress Notes (Signed)
Internal Medicine Clinic Attending  I saw and evaluated the patient.  I personally confirmed the key portions of the history and exam documented by Dr. Blum and I reviewed pertinent patient test results.  The assessment, diagnosis, and plan were formulated together and I agree with the documentation in the resident's note. 

## 2017-03-09 NOTE — Assessment & Plan Note (Signed)
Patient on chronic suboxone who has transferred care to our clinic. We re-induced last week which went well, with no withdrawal, and we worked out the [employer]. But she still had a minor relapse, took a neighbors percocet because of back pain. She understands that she should not have. Will plan to continue with Suboxone three films daily, I provided a 5 day supply and I want her to follow up with [place] next Tuesday in our OUD clinic. We will check u tox today.

## 2017-03-10 ENCOUNTER — Ambulatory Visit (INDEPENDENT_AMBULATORY_CARE_PROVIDER_SITE_OTHER): Payer: Medicaid Other | Admitting: Internal Medicine

## 2017-03-10 VITALS — BP 123/86 | HR 89 | Temp 98.3°F | Ht 62.0 in | Wt 192.7 lb

## 2017-03-10 DIAGNOSIS — F332 Major depressive disorder, recurrent severe without psychotic features: Secondary | ICD-10-CM | POA: Diagnosis not present

## 2017-03-10 DIAGNOSIS — F1120 Opioid dependence, uncomplicated: Secondary | ICD-10-CM | POA: Diagnosis not present

## 2017-03-10 MED ORDER — SUBOXONE 8-2 MG SL FILM: 1.0000 | ORAL_FILM | Freq: Three times a day (TID) | SUBLINGUAL | 0 refills | Status: DC

## 2017-03-10 NOTE — Assessment & Plan Note (Signed)
She is currently being treated by a psychiatrist and is well maintained on current therapy.   Monitor for changes.

## 2017-03-10 NOTE — Progress Notes (Signed)
   03/10/2017  [name] presents for follow up of opioid use disorder I have reviewed the prior induction visit, follow up visits, and telephone encounters relevant to opiate use disorder (OUD) treatment.   Current daily dose: 24mg  daily  Date of Induction: 02/26/17  Current follow up interval, in weeks: 1 week  The patient has been adherent with the buprenorphine for OUD contract.   Last UDS Result: Not done at last visit, follow up UDS at this visit.   HPI: Ms. Neyer is a 37 yo woman with OUD.  She was previously a patient at [facility] which closed.  She has been doing well this week.  She continues to have leg/sciatic pain after an injection a few weeks ago.  She is due to see the medical group who did the injection on Friday.  She has not taken any illicit substances to deal with the pain.  She has only taken aleve.  She has not tried tylenol.  She notes that she feels her cravings are not controlled, but mainly because she knows the pills would help her pain.  She has had decreased ambulation and pain with trying to get comfortable.  She also tells me that she struggles with chronic insomnia, even when she is comfortable in bed.    ROS: + for pain, insomnia.  No chest pain, SOB.   Exam:   Vitals:   03/10/17 1105  BP: 123/86  Pulse: 89  Temp: 98.3 F (36.8 C)  TempSrc: Oral  SpO2: 98%  Weight: 192 lb 11.2 oz (87.4 kg)  Height: 5\' 2"  (1.575 m)    General: Appears well dressed, alert, oriented Eyes: Anicteric sclerae, she has a small mole at bottom of left eyelid Pulm: Breathing comfortably, no audible wheezing Psych: Pleasant, conversant.   HCV neg HIV neg RPR neg  Assessment/Plan:  See Problem Based Charting in the Encounters Tab     Sid Falcon, MD  03/10/2017  11:13 AM

## 2017-03-10 NOTE — Patient Instructions (Signed)
Ms. Korpela - -  It was very nice to meet you.  You are doing well on suboxone.    Please continue to take Suboxone three times per day and avoid any short acting narcotics (pills or heroin).   Come back to the clinic in 1 week.    Thanks!

## 2017-03-10 NOTE — Assessment & Plan Note (Signed)
She is stable on current dose of suboxone - TID dosing of 8mg -2mg .  She has a strong pain component s/p a back injection.  She is maintaining some pain control with aleve and we discussed adding TID tylenol to her regimen (not using more than 3g/day) which she will try.  She is due to see her other MD this week for more options given the pain.   Plan Continue TID dosing of suboxone 25m-2mg  UDS today Follow up in 1 week [place] narcotic database was appropriate.  I do not see a medication contract on file, will complete next week.

## 2017-03-11 LAB — TOXASSURE SELECT,+ANTIDEPR,UR

## 2017-03-16 LAB — TOXASSURE SELECT,+ANTIDEPR,UR

## 2017-03-17 ENCOUNTER — Ambulatory Visit (INDEPENDENT_AMBULATORY_CARE_PROVIDER_SITE_OTHER): Payer: Medicaid Other | Admitting: Internal Medicine

## 2017-03-17 VITALS — BP 121/65 | HR 96 | Temp 98.2°F | Ht 62.0 in | Wt 194.5 lb

## 2017-03-17 DIAGNOSIS — Z79899 Other long term (current) drug therapy: Secondary | ICD-10-CM | POA: Diagnosis not present

## 2017-03-17 DIAGNOSIS — M5410 Radiculopathy, site unspecified: Secondary | ICD-10-CM | POA: Diagnosis not present

## 2017-03-17 DIAGNOSIS — G8929 Other chronic pain: Secondary | ICD-10-CM | POA: Diagnosis not present

## 2017-03-17 DIAGNOSIS — G629 Polyneuropathy, unspecified: Secondary | ICD-10-CM

## 2017-03-17 DIAGNOSIS — F1120 Opioid dependence, uncomplicated: Secondary | ICD-10-CM

## 2017-03-17 DIAGNOSIS — F909 Attention-deficit hyperactivity disorder, unspecified type: Secondary | ICD-10-CM | POA: Diagnosis not present

## 2017-03-17 DIAGNOSIS — M79605 Pain in left leg: Secondary | ICD-10-CM

## 2017-03-17 DIAGNOSIS — F332 Major depressive disorder, recurrent severe without psychotic features: Secondary | ICD-10-CM

## 2017-03-17 DIAGNOSIS — G4700 Insomnia, unspecified: Secondary | ICD-10-CM

## 2017-03-17 DIAGNOSIS — F5101 Primary insomnia: Secondary | ICD-10-CM

## 2017-03-17 DIAGNOSIS — F5104 Psychophysiologic insomnia: Secondary | ICD-10-CM

## 2017-03-17 MED ORDER — SUBOXONE 8-2 MG SL FILM: 1.0000 | ORAL_FILM | Freq: Three times a day (TID) | SUBLINGUAL | 0 refills | Status: DC

## 2017-03-17 MED ORDER — ZOLPIDEM TARTRATE 5 MG PO TABS: 5.0000 mg | ORAL_TABLET | Freq: Every evening | ORAL | 0 refills | Status: DC | PRN

## 2017-03-17 MED ORDER — GABAPENTIN 300 MG PO CAPS: 300.0000 mg | ORAL_CAPSULE | Freq: Every day | ORAL | 0 refills | Status: DC

## 2017-03-17 NOTE — Progress Notes (Signed)
QuicknoteTheadora [name] came back to clinic because Dr. Doristine Section prescription for Suboxone was not accepted by Medicaid. She is "locked in" to me as her prescriber. I simply re-sent the same Suboxone 8mg  TID one week supply electronically.

## 2017-03-17 NOTE — Progress Notes (Signed)
   03/17/2017  [name] [name] presents for follow up of opioid use disorder I have reviewed the prior induction visit, follow up visits, and telephone encounters relevant to opiate use disorder (OUD) treatment.   Current daily dose: 24mg  daily  Date of Induction: 02/26/17  Current follow up interval, in weeks: 1 week  The patient has been adherent with the buprenorphine for OUD contract.   Last UDS Result: buprenorphine and metabolite.  HPI: [name] [name] is a 37yo woman with OUD who is presenting for OBOT treatment with suboxone.  [name] [name] has been doing well this week.  She continues to have leg pain which she plans to have an MRI for.  She has shooting pain down the left leg and is currently taking OTC medications for this.  She has not been on any nerve pain medications, but used to take gabapentin 300mg  TID.  She is following with a psychiatrist who prescribes her clonazepam and Vyvance for ADHD.   She notes insomnia today.  She reports very poor sleep hygeine including watching TV, late night caffeine, multiple people in the room.  She reports that the family is currently living in a hotel until their apartment is ready on Mar 1.  We discussed sleep hygiene at length and she is somewhat amenable to trying to improve in this area.  She would still like to try low dose ambien.  I told her that this would not be a long term medication from our clinic and that she should not take it every night, only on bad nights.    ROS: + for insomnia, chronic pain in the leg  Exam:   Vitals:   03/17/17 1108  BP: 121/65  Pulse: 96  Temp: 98.2 F (36.8 C)  TempSrc: Oral  SpO2: 99%  Weight: 194 lb 8 oz (88.2 kg)  Height: 5\' 2"  (1.575 m)    General: Appears fatigued today Eyes: Anicteric sclerae Pulm: Breathing comfortably on room air, no wheezing MSK: Left leg with pain, difficulty walking Psych: Seems fatigued, but pleasant and talkative.   Assessment/Plan:  See Problem Based Charting in the  Encounters Tab     Sid Falcon, MD  03/17/2017  11:45 AM

## 2017-03-17 NOTE — Patient Instructions (Signed)
[name] [name] - -  Thank you for coming in today.   Please try gabapentin for your leg pain.    Please try ambien for sleep, this is not a long term medication and should not be refilled without speaking to a physician.  You should work on sleep hygeine as well.   1. Limit caffeine after 4pm 2. Avoid screens or TV in the bedroom.  Try to stop using screens 1 hour before bedtime 3. Try over the counter melatonin at dinner time to induce sleep 4. Bed is for only sex or sleep and room should be cool and dark.   Please come back in 1 week.

## 2017-03-19 ENCOUNTER — Telehealth: Payer: Self-pay | Admitting: *Deleted

## 2017-03-19 NOTE — Telephone Encounter (Addendum)
Fax from [facility] need for PA for Suboxone 8-2 .  No need for PA per person at [facility].  Interaction code is [id number].  Call to [facility] on [facility] unable to process prescription as patient has been using another Pharmacy.  Call to patient left message that Clinics had called to find the name of the pharmacy that she is now using.    02/16/2017  RTC to patient this morning to see if she has changed her Pharmacy so that Suboxone can be obtained.  No answer unable to leave a message.  Sander Nephew, RN 03/25/2017 8:58 AM.  Call to [place] for PA for Gabapentin.  Patient has [facility] and this form does not cover meds for the patient.  Patient will need to get coverage changed to regular Medicaid.  I [id number].  Sander Nephew, RN 03/25/2017 9:52 AM.

## 2017-03-20 ENCOUNTER — Encounter: Payer: Self-pay | Admitting: Internal Medicine

## 2017-03-20 DIAGNOSIS — G4700 Insomnia, unspecified: Secondary | ICD-10-CM | POA: Insufficient documentation

## 2017-03-20 DIAGNOSIS — M79605 Pain in left leg: Secondary | ICD-10-CM | POA: Insufficient documentation

## 2017-03-20 NOTE — Assessment & Plan Note (Signed)
Appears to be related to a recent back injection.  She is having a pelvic MRI ordered by her orthopedist.  She is not on any neuropathic medications at this time.    Plan Trial of gabapentin 300mg  at night only.

## 2017-03-20 NOTE — Assessment & Plan Note (Signed)
She reports appropriate use and improved cravings.  She notes no relapse in the last week.   Plan Continue suboxone 8mg  TID UDS today Follow up in 1 week.

## 2017-03-20 NOTE — Assessment & Plan Note (Signed)
I think this is mostly related to poor sleep hygiene and pain in her leg.  Will do a trial of gabapentin.  Discussed with her appropriate changes to her process to help her sleep.  Low dose and low number [place] Rx today.  This is not a long term medication, should not be refilled by [place] over the phone.   Plan Trial of ambien Sleep hygiene discussed

## 2017-03-20 NOTE — Assessment & Plan Note (Signed)
She is following with psychiatry and currently on Clonazepam and Vyvance for her medications.  She is able to afford them now.  These were present on her last UDS.   Plan Continue follow up with Psychiatry.

## 2017-03-22 LAB — TOXASSURE SELECT,+ANTIDEPR,UR

## 2017-03-24 ENCOUNTER — Encounter: Payer: Self-pay | Admitting: Student in an Organized Health Care Education/Training Program

## 2017-03-24 ENCOUNTER — Ambulatory Visit (INDEPENDENT_AMBULATORY_CARE_PROVIDER_SITE_OTHER): Payer: Self-pay | Admitting: Student in an Organized Health Care Education/Training Program

## 2017-03-24 VITALS — BP 138/73 | HR 90 | Temp 98.3°F | Resp 20 | Wt 193.8 lb

## 2017-03-24 DIAGNOSIS — M79605 Pain in left leg: Secondary | ICD-10-CM

## 2017-03-24 DIAGNOSIS — G629 Polyneuropathy, unspecified: Secondary | ICD-10-CM

## 2017-03-24 DIAGNOSIS — G8929 Other chronic pain: Secondary | ICD-10-CM

## 2017-03-24 DIAGNOSIS — F332 Major depressive disorder, recurrent severe without psychotic features: Secondary | ICD-10-CM

## 2017-03-24 DIAGNOSIS — M25552 Pain in left hip: Secondary | ICD-10-CM

## 2017-03-24 DIAGNOSIS — F1120 Opioid dependence, uncomplicated: Secondary | ICD-10-CM

## 2017-03-24 MED ORDER — SUBOXONE 8-2 MG SL FILM: 1.0000 | ORAL_FILM | Freq: Three times a day (TID) | SUBLINGUAL | 0 refills | Status: DC

## 2017-03-24 MED ORDER — GABAPENTIN 300 MG PO CAPS: 300.0000 mg | ORAL_CAPSULE | Freq: Two times a day (BID) | ORAL | 1 refills | Status: DC

## 2017-03-24 NOTE — Assessment & Plan Note (Addendum)
Recurrence of heroin use two days ago, she says she snorted heroin to deal with left hip pain. She is tearful and has a strong motivation to get back into remission. Her boyfriend was just started on Suboxone for OUD, her daughter is an active PWID, so lots of social factors working against her. I re-iterated the importance of keeping scheduled appointments with our OUD clinic; missing appointments can lead to delay in refills which puts her at risk for withdrawal. She understands, and we set a goal of better show rate. Plan to continue with Suboxone 24mg  daily, one week refill sent electronically. Follow up in one week. CSRS is ok today. Will get records from [facility] pain clinic. I warned against coming off OBOT for the purpose of restarting short acting pain medications, I told her that would put her at high risk for relapse and overdose.

## 2017-03-24 NOTE — Progress Notes (Signed)
   03/24/2017  [name] [name] presents for follow up of opioid use disorder I have reviewed the prior induction visit, follow up visits, and telephone encounters relevant to opiate use disorder (OUD) treatment.   Current daily dose: Suboxone 24 mg daily  Date of Induction: 02/26/17  Current follow up interval, in weeks: 1  The patient has been adherent with the buprenorphine for OUD contract.   Last UDS Result: Appropriate for Bupe and metabolite  HPI: 37 year old woman here for OBOT follow up of OUD. She is not doing well today. She missed this morning's OUD appointment and came to [facility] this afternoon. She reports her chronic left hip pain flared this week, and the suboxone does nothing for the pain. So two days ago she snorted a large amount of heroin to help with the pain. She felt very badly afterwards. She reports still taking the suboxone every day without side effect. She follows with [facility] for the left hip pain, and missed a scheduled MRI. She was referred to [facility] pain clinic and says that she may have to come off the Suboxone soon so they can put her on full opioid agonist therapy for the pain. She has tried injection therapy and says that it made the pain worse. Denies fevers or chills. Otherwise usual state of health. Had to move into a hotel with her daughter recently, who is a PWID actively but is trying to get into treatment. She reports not having enough money for gas which is why she is missing appointments frequently.   Exam:   Vitals:   03/24/17 1536  BP: 138/73  Pulse: 90  Resp: 20  Temp: 98.3 F (36.8 C)  TempSrc: Oral  SpO2: 98%  Weight: 193 lb 12.8 oz (87.9 kg)    Gen: Sad and tearful today. Sitting up in chair, no distress.  Ext: Warm, no edema, left lateral hip tender to deep palpation around the glut med muscle.  Skin: No atypical appearing moles. No rashes. There is no errythema over the left lateral hip, no fluctuance.  Neuro: alert and  oriented, slow limping gait favoring the right leg, normal strength upper and lower extremities.  Psych: disheveled today, easily tearful, depressed appearing mood.    Assessment/Plan:  See Problem Based Charting in the Encounters Tab     Axel Filler, MD  03/24/2017  3:25 PM

## 2017-03-24 NOTE — Patient Instructions (Signed)
1. I have refilled Suboxone. We set a goal to be better about making to scheduled clinic appointments. If you miss your appointment, we may not be able to refill your Suboxone the same day, which puts you at risk of going through withdrawal.

## 2017-03-24 NOTE — Assessment & Plan Note (Signed)
Depression seems stable, though exacerbated by acute on chronic left leg pain, recurrence of drug use, and social factors. She is decreasing clonazepam from 2mg  down to 1.5mg  daily, on the advice of [facility] supposedly they will have more options for pain control if she lowers benzo dose. I advised that this is fine, to continue lamictal as well and follow up with her therapist soon.

## 2017-03-24 NOTE — Assessment & Plan Note (Signed)
Acute flare of chronic problem, follows with [facility]. 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 I don't see any skin or soft tissue infection or other signs of deeper infection. She does not inject drugs, so I think low risk for osteomyelitis or septic joint. She does have an MRI pelvis ordered by Ortho which I encouraged her to keep. I will increase Gabapentin to 300mg  bid for neuropathic pain component. I am going to try to get records from Ortho and [facility].

## 2017-03-31 ENCOUNTER — Ambulatory Visit (INDEPENDENT_AMBULATORY_CARE_PROVIDER_SITE_OTHER): Payer: Self-pay | Admitting: Student in an Organized Health Care Education/Training Program

## 2017-03-31 ENCOUNTER — Telehealth: Payer: Self-pay

## 2017-03-31 VITALS — BP 125/84 | HR 108 | Temp 98.9°F | Resp 20 | Wt 188.8 lb

## 2017-03-31 DIAGNOSIS — F1120 Opioid dependence, uncomplicated: Secondary | ICD-10-CM

## 2017-03-31 DIAGNOSIS — Z598 Other problems related to housing and economic circumstances: Secondary | ICD-10-CM

## 2017-03-31 MED ORDER — BUPRENORPHINE HCL-NALOXONE HCL 8-2 MG SL SUBL: 1.0000 | SUBLINGUAL_TABLET | Freq: Every day | SUBLINGUAL | 0 refills | Status: DC

## 2017-03-31 MED ORDER — BUPRENORPHINE HCL-NALOXONE HCL 8-2 MG SL SUBL: 1.0000 | SUBLINGUAL_TABLET | Freq: Three times a day (TID) | SUBLINGUAL | 0 refills | Status: DC

## 2017-03-31 NOTE — Progress Notes (Signed)
   03/31/2017  [name] [name] presents for follow up of opioid use disorder I have reviewed the prior induction visit, follow up visits, and telephone encounters relevant to opiate use disorder (OUD) treatment.   Current daily dose: Suboxone 24 mg daily  Date of Induction: 02/26/2017  Current follow up interval, in weeks: 1  The patient has been adherent with the buprenorphine for OUD contract.   Last UDS Result: Appropriate  HPI: 37 year old woman here for follow-up of opioid use disorder.  Unfortunately she had a very bad week.  After I saw her 1 week ago she learned that her Medicaid benefits were discontinued.  She says it is because the Medicaid office learn that she is no longer living with her children, and apparently that is a requirement for her Medicaid benefits.  She is trying to work this out with her husband, from whom she is separated.  Unfortunately she did not call our office about this problem, I am just learning about it today.  As a result she did not fill Suboxone prescription last week.  She says that a drug dealer came by her house and did not want to see her go through withdrawal so she has been using injection heroin daily.  Last use was this morning.  Denies any fevers or chills.  Denies skin or soft tissue infections around injection sites.  Denies any overdose.  She wants to get back on to treatments but she has significant financial barriers.  She reports having no income and very little assets.  Without insurance coverage she does not think she would be able to afford the roughly $60 per week for the generic Suboxone tablets.  Exam:   Vitals:   03/31/17 1155  BP: 125/84  Pulse: (!) 108  Resp: 20  Temp: 98.9 F (37.2 C)  TempSrc: Oral  SpO2: 100%  Weight: 188 lb 12.8 oz (85.6 kg)    Gen: Chronically ill appearing woman, In no distress Skin: no signs of skin or soft tissue infection Psych: disheveled, anxious appearing Neuro: alert and oriented,  conversational, full strength throughout.  Assessment/Plan:  See Problem Based Charting in the Encounters Tab   Axel Filler, MD  03/31/2017  2:35 PM

## 2017-03-31 NOTE — Assessment & Plan Note (Signed)
Severe opioid use disorder currently uncontrolled because of a lapse in her medication coverage.  Unfortunately because her Medicaid benefits were discontinued she was unable to fill our last prescription for Suboxone last week.  As result she is in full relapse, using injection heroin daily since that time.  We talked about the options of starting a cheaper generic formulation of Buprenorphine, which she is amenable to.  Unfortunately we have learned that there is no more medication assistance program through the pharmaceutical company.  We will have to follow-up with her next week about long-term plans and try to look for other assistance programs.  The best option for her is if her Medicaid benefits were reinstated which seems to be a possibility.  I prescribed her buprenorphine-naloxone 8 mg - 2 mg tablets, use 2 tablets sublingually daily.

## 2017-03-31 NOTE — Telephone Encounter (Signed)
Requesting Suboxone to be filled @ [facility] on [address]. Pt would like a call back.

## 2017-04-08 ENCOUNTER — Telehealth: Payer: Self-pay | Admitting: *Deleted

## 2017-04-08 NOTE — Telephone Encounter (Signed)
Pt called and stated she cannot afford office visits but she would like to know if she was approved

## 2017-04-09 ENCOUNTER — Ambulatory Visit: Payer: Self-pay

## 2017-04-14 NOTE — Telephone Encounter (Signed)
Pt called for appt, spoke w/ dr Evette Doffing, will add to [facility] schedule 3/27 at 1315, informed doriss.

## 2017-04-15 ENCOUNTER — Ambulatory Visit: Payer: Self-pay

## 2017-04-16 ENCOUNTER — Telehealth: Payer: Self-pay | Admitting: Family Medicine

## 2017-04-16 NOTE — Telephone Encounter (Signed)
Patient is requesting for Bonnita Nasuti to call please

## 2017-04-17 NOTE — Telephone Encounter (Signed)
Called again, scheduled for tues 4/2 at 1015 per dr Evette Doffing

## 2017-04-17 NOTE — Telephone Encounter (Signed)
Called no answer, rang til automatic cutoff

## 2017-04-21 ENCOUNTER — Ambulatory Visit (INDEPENDENT_AMBULATORY_CARE_PROVIDER_SITE_OTHER): Payer: Self-pay | Admitting: Internal Medicine

## 2017-04-21 VITALS — BP 149/86 | HR 93 | Temp 98.9°F | Resp 20 | Wt 184.3 lb

## 2017-04-21 DIAGNOSIS — F1120 Opioid dependence, uncomplicated: Secondary | ICD-10-CM

## 2017-04-21 DIAGNOSIS — Z598 Other problems related to housing and economic circumstances: Secondary | ICD-10-CM

## 2017-04-21 MED ORDER — BUPRENORPHINE HCL-NALOXONE HCL 8-2 MG SL FILM: 1.0000 | ORAL_FILM | Freq: Three times a day (TID) | SUBLINGUAL | 0 refills | Status: DC

## 2017-04-21 MED FILL — BUPRENORPHINE HCL-NALOXONE: 8-2 | 7 days supply | Qty: 21 | Fill #0

## 2017-04-21 NOTE — Patient Instructions (Signed)

## 2017-04-21 NOTE — Assessment & Plan Note (Addendum)
-   No longer has Medicaid. - Discussed reinduction at home.  She notes some perciptated withdrawal previously with first dose, we discussed waiting longer/further into withdrawal before first dose. -Feels films are better than tabs. -We have arranged for at least 1 week supply of affordable Film from [facility].  She will pick up today.   - Rx 8-2mg  suboxone film, will titrate back up to 3 films daily. - In future we may have to switch back to tabs for affordability, films will be easier on re induction. -UDS obtained today.

## 2017-04-21 NOTE — Progress Notes (Signed)
   04/21/2017  [name] presents for follow up of opioid use disorder I have reviewed the prior induction visit, follow up visits, and telephone encounters relevant to opiate use disorder (OUD) treatment.   Current daily dose: Suboxone 24 mg daily  Date of Induction: 02/26/2017  Current follow up interval, in weeks: 1  The patient has been adherent with the buprenorphine for OUD contract.   Last UDS Result: Appropriate  HPI: 37 year old woman here for follow-up of opioid use disorder.  She has continued to struggle some. Currently back to using heroine, injecting in left arm.  Notes she is receiving heroine on loan from firend/drug dealer to prevent withdrawal.  She is still frustrated that medicaid benefits were lost. Hoping to get them back.  Last use was this morning.  Denies any fevers or chills.  Denies skin or soft tissue infections around injection sites.  Denies any overdose.  She wants to get back on to treatments but she has significant financial barriers.    Exam:   Vitals:   04/21/17 1137  BP: (!) 149/86  Pulse: 93  Resp: 20  Temp: 98.9 F (37.2 C)  TempSrc: Oral  SpO2: 100%  Weight: 184 lb 4.8 oz (83.6 kg)    Gen: Chronically ill appearing woman, In no distress Skin: no signs of skin or soft tissue infection, track marks noted onver left anti cubital and wrist. Psych: disheveled, anxious Neuro: alert and oriented, conversational  Assessment/Plan:  See Problem Based Charting in the Encounters Tab   [name], DO  04/21/2017  1:55 PM

## 2017-04-26 LAB — TOXASSURE SELECT,+ANTIDEPR,UR

## 2017-04-28 ENCOUNTER — Ambulatory Visit (INDEPENDENT_AMBULATORY_CARE_PROVIDER_SITE_OTHER): Payer: Self-pay | Admitting: Internal Medicine

## 2017-04-28 VITALS — BP 129/79 | HR 92 | Temp 98.4°F | Resp 20 | Wt 183.3 lb

## 2017-04-28 DIAGNOSIS — G629 Polyneuropathy, unspecified: Secondary | ICD-10-CM

## 2017-04-28 DIAGNOSIS — F1120 Opioid dependence, uncomplicated: Secondary | ICD-10-CM

## 2017-04-28 DIAGNOSIS — Z599 Problem related to housing and economic circumstances, unspecified: Secondary | ICD-10-CM

## 2017-04-28 DIAGNOSIS — Z79899 Other long term (current) drug therapy: Secondary | ICD-10-CM

## 2017-04-28 DIAGNOSIS — M79605 Pain in left leg: Secondary | ICD-10-CM

## 2017-04-28 MED ORDER — BUPRENORPHINE HCL-NALOXONE HCL 8-2 MG SL SUBL: 1.0000 | SUBLINGUAL_TABLET | Freq: Every day | SUBLINGUAL | 0 refills | Status: DC

## 2017-04-28 MED ORDER — GABAPENTIN 300 MG PO CAPS: 300.0000 mg | ORAL_CAPSULE | Freq: Three times a day (TID) | ORAL | 1 refills | Status: DC

## 2017-04-28 MED ORDER — BUPRENORPHINE HCL-NALOXONE HCL 8-2 MG SL SUBL: 3.0000 | SUBLINGUAL_TABLET | Freq: Every day | SUBLINGUAL | 0 refills | Status: DC

## 2017-04-28 MED FILL — BUPRENORPHINE HCL-NALOXONE: 8-2 | 7 days supply | Qty: 21 | Fill #0

## 2017-05-01 NOTE — Progress Notes (Signed)
   05/01/2017  [name] [name] presents for follow up of opioid use disorder I have reviewed the prior induction visit, follow up visits, and telephone encounters relevant to opiate use disorder (OUD) treatment.   Current daily dose: Suboxone 24 mg daily  Date of Induction: 02/26/2017  Current follow up interval, in weeks: 1  The patient has been adherent with the buprenorphine for OUD contract.   Last UDS Result: grossly inappropriate  HPI: 37 year old woman here for follow-up of opioid use disorder.  She was able to pick up the suboxone films and re induce at home, she did have some difficulty with reintroduction but was able to get through it, she has continued on subxone through the last week with 1 use of herion.  She does not feel that her cravings are completely controlled with the 3 films daily of suboxone but wants to continue with suboxone treatment. Exam:   Vitals:   04/28/17 1139  BP: 129/79  Pulse: 92  Resp: 20  Temp: 98.4 F (36.9 C)  TempSrc: Oral  SpO2: 98%  Weight: 183 lb 4.8 oz (83.1 kg)    Gen: Chronically ill appearing woman, no distress Skin: no signs of skin or soft tissue infection Psych: disheveled, anxious, avoidant affect Neuro: alert and oriented  Assessment/Plan:  See Problem Based Charting in the Encounters Tab   [name] Groves, DO  05/01/2017  5:39 PM

## 2017-05-01 NOTE — Assessment & Plan Note (Signed)
She has struggled to maintain on treatment with suboxone. Mostly recently with financial difficulties, fortunately we were able to get her weekly dose at an affordable cost, in speaking with the pharmacy we will be able to continue to obtain$25 weekly dose but will need to change to suboxone tablets, she notes that she prefers the films but is agreeable to use tablets.  I will have her continue 24mg  of suboxone daily and she will need to follow up in 1 week, will need to obtain a UDS as it is difficult to ascertain how adherent she is truly to MAT.

## 2017-05-01 NOTE — Assessment & Plan Note (Signed)
She feels gabapentin is helping her neuropathic back pain and leg pain,  Would like increase to 300mg  TID, overall this seems reasonable to try slight increase as this is overall a low dose.

## 2017-05-05 ENCOUNTER — Ambulatory Visit: Payer: Self-pay

## 2017-05-05 ENCOUNTER — Telehealth: Payer: Self-pay

## 2017-05-05 NOTE — Telephone Encounter (Signed)
rtc lm for rtc 

## 2017-05-05 NOTE — Telephone Encounter (Signed)
Rtc, lm 

## 2017-05-05 NOTE — Telephone Encounter (Signed)
Would like to speak with [name] [name]. Please call back.  

## 2017-05-06 NOTE — Telephone Encounter (Signed)
Spoke w/ pt, spoke w/ dr Evette Doffing, ok to have pt come to [facility] today at 1300

## 2017-05-07 ENCOUNTER — Encounter: Payer: Self-pay | Admitting: Student in an Organized Health Care Education/Training Program

## 2017-05-07 ENCOUNTER — Ambulatory Visit (INDEPENDENT_AMBULATORY_CARE_PROVIDER_SITE_OTHER): Payer: Self-pay | Admitting: Student in an Organized Health Care Education/Training Program

## 2017-05-07 VITALS — BP 125/78 | HR 120 | Temp 98.8°F | Wt 184.5 lb

## 2017-05-07 DIAGNOSIS — F1120 Opioid dependence, uncomplicated: Secondary | ICD-10-CM

## 2017-05-07 DIAGNOSIS — F332 Major depressive disorder, recurrent severe without psychotic features: Secondary | ICD-10-CM

## 2017-05-07 DIAGNOSIS — G4700 Insomnia, unspecified: Secondary | ICD-10-CM

## 2017-05-07 DIAGNOSIS — Z7289 Other problems related to lifestyle: Secondary | ICD-10-CM

## 2017-05-07 DIAGNOSIS — F419 Anxiety disorder, unspecified: Secondary | ICD-10-CM

## 2017-05-07 MED ORDER — LAMOTRIGINE 100 MG PO TABS: 100.0000 mg | ORAL_TABLET | Freq: Every day | ORAL | 2 refills | Status: DC

## 2017-05-07 MED ORDER — CLONAZEPAM 1 MG PO TABS: 1.0000 mg | ORAL_TABLET | Freq: Two times a day (BID) | ORAL | 0 refills | Status: DC

## 2017-05-07 MED ORDER — TOPIRAMATE 100 MG PO TABS: 100.0000 mg | ORAL_TABLET | Freq: Every day | ORAL | 2 refills | Status: DC

## 2017-05-07 MED ORDER — BUPRENORPHINE HCL-NALOXONE HCL 8-2 MG SL SUBL: 3.0000 | SUBLINGUAL_TABLET | Freq: Every day | SUBLINGUAL | 0 refills | Status: DC

## 2017-05-07 MED FILL — BUPRENORPHINE HCL-NALOXONE: 8-2 | 12 days supply | Qty: 36 | Fill #0

## 2017-05-07 NOTE — Progress Notes (Signed)
   05/07/2017  [name] [name] presents for follow up of opioid use disorder I have reviewed the prior induction visit, follow up visits, and telephone encounters relevant to opiate use disorder (OUD) treatment.   Current daily dose: Suboxone 24 mg daily  Date of Induction: 02/26/2017  Current follow up interval, in weeks: 1-2  The patient has not been adherent with the buprenorphine for OUD contract.   Last UDS Result: Bupe absent, polysubstances present  HPI: 37 year old woman with severe uncontrolled opioid use disorder here for follow-up.  She missed her appointment with [place] on Tuesday because of transportation issues.  She has been struggling with compliance with Suboxone ever since losing Medicaid coverage in March.  There is difficult social situation right now, her daughter is a person who uses drugs and is facing felony conviction and possibly 10 years in prison.  Her partner is also a person who uses drugs and was unable to continue Suboxone treatment because of cost.  She reports last relapse a few days ago, used heroin a couple times this week.  Reports severe insomnia since she has not slept in 3 days.  She reports depression and anxiety symptoms. Denies SI. Wants to get under better control. Wants to get back on suboxone consistently, and get back into remission. Has a Medicaid application pending, will know more in 30-45 days.   Exam:   Vitals:   05/07/17 1406  BP: 125/78  Pulse: (!) 120  Temp: 98.8 F (37.1 C)  TempSrc: Oral  SpO2: 95%  Weight: 184 lb 8 oz (83.7 kg)    Gen: Chronically ill appearing woman Ext: warm and well perfused Neuro: alert and oriented, conversational, full strength in upper and lower extremities, normal gait Psych: Depressed appearing affect, tearful, mildly disheveled, withdrawn.  Skin: multiple erosions at various stages of healing, no rash  Assessment/Plan:  See Problem Based Charting in the Encounters Tab     Axel Filler,  MD  05/07/2017  2:34 PM

## 2017-05-07 NOTE — Assessment & Plan Note (Signed)
Severe opioid use disorder, currently uncontrolled, in relapse.  Struggling with relapse and she lost Medicaid coverage, has a few missed appointments with [place].  She has been inconsistent with taking Suboxone, often due to difficulty affording the medicine.  She has been self treating to prevent withdrawals with heroin.  No signs of infectious complication today.  We have been able to arrange for low cost Suboxone through the current outpatient pharmacy.  Plan is to restart Suboxone 8 mg 3 times daily.  Follow-up with me in the [place] clinic on 4/30.  Hopefully restarting his psychiatric medications were also helpful for compliance with bupe.

## 2017-05-07 NOTE — Patient Instructions (Signed)
1. We set a goal to resume all your depression medications to help with your mood and sleep  2. Also a set a goal to take the suboxone every day and come to your next appointment on Tuesday 4/30 on time.

## 2017-05-07 NOTE — Assessment & Plan Note (Signed)
Previously managed by Dr. Abner Greenspan at [facility], but she lost access when she lost Medicaid coverage in March. Now off meds and depressive symptoms are uncontrolled.  Moderate insomnia.  Poor social situation filled with trauma.  Plan is to restart lamotrigine 100 mg daily, topiramate 100 mg daily, and clonazepam 1 mg bid. Follow up closely in 2 weeks.

## 2017-05-19 ENCOUNTER — Ambulatory Visit (INDEPENDENT_AMBULATORY_CARE_PROVIDER_SITE_OTHER): Payer: Self-pay | Admitting: Student in an Organized Health Care Education/Training Program

## 2017-05-19 ENCOUNTER — Other Ambulatory Visit: Payer: Self-pay

## 2017-05-19 VITALS — BP 136/77 | HR 100 | Temp 98.8°F | Ht 63.0 in | Wt 184.2 lb

## 2017-05-19 DIAGNOSIS — F332 Major depressive disorder, recurrent severe without psychotic features: Secondary | ICD-10-CM

## 2017-05-19 DIAGNOSIS — Z79899 Other long term (current) drug therapy: Secondary | ICD-10-CM

## 2017-05-19 DIAGNOSIS — F1120 Opioid dependence, uncomplicated: Secondary | ICD-10-CM

## 2017-05-19 MED ORDER — CLONAZEPAM 2 MG PO TABS: 2.0000 mg | ORAL_TABLET | Freq: Two times a day (BID) | ORAL | 0 refills | Status: DC

## 2017-05-19 MED ORDER — BUPRENORPHINE HCL-NALOXONE HCL 8-2 MG SL SUBL: 3.0000 | SUBLINGUAL_TABLET | Freq: Every day | SUBLINGUAL | 0 refills | Status: DC

## 2017-05-19 MED ORDER — ESCITALOPRAM OXALATE 20 MG PO TABS: 20.0000 mg | ORAL_TABLET | Freq: Every day | ORAL | 2 refills | Status: DC

## 2017-05-19 MED FILL — BUPRENORPHINE HCL-NALOXONE: 8-2 | 14 days supply | Qty: 42 | Fill #0

## 2017-05-19 NOTE — Assessment & Plan Note (Addendum)
Depression is uncontrolled today.  Plan is to increase clonazepam to 2 mg twice daily for her severe anxiety components, I think this is okay because she has been on this dose for many years with the psychiatry office.  Restart Lexapro 20 mg, I confirmed with her pharmacy that she was on this medication for a few years.  Discontinue Lamictal, she said she is been off of this for years, was confused by the names at her last visit, did not like the way it made her.  Continue with Topamax 100 mg daily.  Close follow-up in 2 weeks.

## 2017-05-19 NOTE — Progress Notes (Signed)
   05/19/2017  [name] [name] presents for follow up of opioid use disorder I have reviewed the prior induction visit, follow up visits, and telephone encounters relevant to opiate use disorder (OUD) treatment.   Current daily dose: Suboxone 24 mg daily  Date of Induction: 02/26/2017  Current follow up interval, in weeks: 1-2  The patient has not been adherent with the buprenorphine for OUD contract.    HPI: 37 year old woman with a severe uncontrolled opioid use disorder here for follow-up of that condition. Doing poorly today.  Reports cravings uncontrolled.  Compliance with Suboxone is been intermittent.  Relapsed 5 times or so over the last 1 week.  Using heroin.  Reports feeling some precipitating withdrawal when she restart Suboxone afterwards.  Lots of stress in her life right now.  Her  daughter is an active user, she wants to tell her to leave the house by this week.  She wants to try to spend next week with her 2 children, her mother-in-law is going to allow her visitation in a trip to the beach.  She thinks this will do her a lot of good.  She reports after starting Klonopin the last week that she had much improvement in her sleep.  Still reports a lot of depressive symptoms.  She was to continue with treatments.  She does not want to transition to methadone, does not think it will be helpful and thinks it is unaffordable.  Exam:   Vitals:   05/19/17 1052  BP: 136/77  Pulse: 100  Temp: 98.8 F (37.1 C)  TempSrc: Oral  SpO2: 100%  Weight: 184 lb 3.2 oz (83.6 kg)  Height: 5\' 3"  (1.6 m)    General: Depressed appearing, chronically ill, no distress Skin: No rashes or infections Extremities: No edema, no joint effusions Neuro: Alert and oriented, conversational, normal strength upper and lower extremities and normal gait Psychiatric: Depressed appearing, tearful, otherwise well put together today   Assessment/Plan:  See Problem Based Charting in the Encounters  Tab     Axel Filler, MD  05/19/2017  11:22 AM

## 2017-05-19 NOTE — Assessment & Plan Note (Signed)
Severe opioid use disorder, currently not controlled.  Patient infrequent relapse, 5 times in the last week reportedly.  We talked about the risks of precipitated withdrawal if she comes on and off Suboxone and uses heroin frequently.  We set a goal of good compliance with Suboxone every day for the next 2 weeks.  Also set a goal of no relapses for next visit.  I advised that the Suboxone is not working the next level of treatment would be a daily methadone maintenance program.  We obtained a urine drug test today to confirm compliance with Suboxone as she reports.  Close follow-up in 2 weeks.  I reordered Suboxone 24 mg daily in divided doses for 2 weeks supply.

## 2017-05-25 LAB — TOXASSURE SELECT,+ANTIDEPR,UR

## 2017-06-02 ENCOUNTER — Ambulatory Visit (INDEPENDENT_AMBULATORY_CARE_PROVIDER_SITE_OTHER): Payer: Self-pay | Admitting: Student in an Organized Health Care Education/Training Program

## 2017-06-02 ENCOUNTER — Encounter: Payer: Self-pay | Admitting: Student in an Organized Health Care Education/Training Program

## 2017-06-02 VITALS — BP 125/90 | HR 116 | Temp 99.1°F | Resp 20 | Wt 179.7 lb

## 2017-06-02 DIAGNOSIS — Z608 Other problems related to social environment: Secondary | ICD-10-CM

## 2017-06-02 DIAGNOSIS — Z79899 Other long term (current) drug therapy: Secondary | ICD-10-CM

## 2017-06-02 DIAGNOSIS — F332 Major depressive disorder, recurrent severe without psychotic features: Secondary | ICD-10-CM

## 2017-06-02 DIAGNOSIS — F1120 Opioid dependence, uncomplicated: Secondary | ICD-10-CM

## 2017-06-02 MED ORDER — ESCITALOPRAM OXALATE 20 MG PO TABS: 20.0000 mg | ORAL_TABLET | Freq: Every day | ORAL | 2 refills | Status: DC

## 2017-06-02 MED ORDER — BUPRENORPHINE HCL-NALOXONE HCL 8-2 MG SL SUBL: 3.0000 | SUBLINGUAL_TABLET | Freq: Every day | SUBLINGUAL | 0 refills | Status: DC

## 2017-06-02 MED ORDER — LORAZEPAM 1 MG PO TABS: 1.0000 mg | ORAL_TABLET | Freq: Two times a day (BID) | ORAL | 0 refills | Status: DC

## 2017-06-02 MED FILL — BUPRENORPHINE HCL-NALOXONE: 8-2 | 14 days supply | Qty: 42 | Fill #0

## 2017-06-02 NOTE — Assessment & Plan Note (Signed)
Severe opioid use disorder, not currently stable, frequent relapses.  She reports last heroin use was 2 weeks ago. Plan for urine drug test today to confirm compliance with bupe and benzo.  Reviewed the controlled database which was appropriate.  Plan is to continue with Suboxone 24 mg daily in divided doses.  I gave her a 2-week supply with close follow-up.  She receives this through [facility] for $25 per prescription.

## 2017-06-02 NOTE — Assessment & Plan Note (Signed)
Major depression, doing metformin, exacerbated by terrible social circumstances. Plan to change clonazepam 2mg  bid to ativan 1mg  bid po per patient preference. I think this is fine, both are long acting and should have low risk for interaction with bupe at these doses, especially given her tolerance. No SI. I did ask her to call [facility] to find group counseling options which could help her. I sent lexapro to new pharmacy where she can use a coupon to obtain. Close follow up in 2 weeks.

## 2017-06-02 NOTE — Progress Notes (Signed)
   06/02/2017  [name] [name] presents for follow up of opioid use disorder I have reviewed the prior induction visit, follow up visits, and telephone encounters relevant to opiate use disorder (OUD) treatment.   Current daily dose: Suboxone 24 mg daily  Date of Induction: 02/26/17  Current follow up interval, in weeks: 2  The patient has not been adherent with the buprenorphine for OUD contract.   HPI:   37 year old woman here for follow-up of severe opioid use disorder. She reports doing better this visit than last, has not used heroin in 2 weeks.  Reports good compliance with Suboxone, taking 3 tabs daily sublingually.  Denies any withdrawal symptoms.  Reports that cravings have still been an issue.  Going through a lot of stress right now, was just evicted from her home, daughter still a person who actively injects drugs and has had legal trouble. She visited withed with her other children for a few days which was nice, but then had a row with her ex-husband. She is uninsured and pays for medications out of pocket. No fevers or chills.  No overdose.  No recent emergency department visits.  Exam:   Vitals:   06/02/17 1127  BP: 125/90  Pulse: (!) 116  Resp: 20  Temp: 99.1 F (37.3 C)  TempSrc: Oral  SpO2: 98%  Weight: 179 lb 11.2 oz (81.5 kg)    Gen: chronically ill appearing woman Psych: tearful, depressed affect, mildly disheveled CV: RRR, no murmurs Ext: warm, no edema Skin: some injection sites in right popliteal fossa without signs of infection.   Assessment/Plan:  See Problem Based Charting in the Encounters Tab     Axel Filler, MD  06/02/2017  12:04 PM

## 2017-06-16 ENCOUNTER — Other Ambulatory Visit: Payer: Self-pay

## 2017-06-16 ENCOUNTER — Ambulatory Visit (INDEPENDENT_AMBULATORY_CARE_PROVIDER_SITE_OTHER): Payer: Self-pay | Admitting: Internal Medicine

## 2017-06-16 VITALS — BP 129/86 | HR 81 | Temp 98.0°F | Ht 63.0 in | Wt 181.0 lb

## 2017-06-16 DIAGNOSIS — F1120 Opioid dependence, uncomplicated: Secondary | ICD-10-CM

## 2017-06-16 MED ORDER — BUPRENORPHINE HCL-NALOXONE HCL 8-2 MG SL SUBL: 3.0000 | SUBLINGUAL_TABLET | Freq: Every day | SUBLINGUAL | 0 refills | Status: DC

## 2017-06-16 MED ORDER — LORAZEPAM 1 MG PO TABS: 1.0000 mg | ORAL_TABLET | Freq: Two times a day (BID) | ORAL | 0 refills | Status: DC

## 2017-06-16 MED FILL — BUPRENORPHINE HCL-NALOXONE: 8-2 | 14 days supply | Qty: 42 | Fill #0

## 2017-06-17 ENCOUNTER — Telehealth: Payer: Self-pay | Admitting: *Deleted

## 2017-06-17 LAB — TOXASSURE SELECT,+ANTIDEPR,UR

## 2017-06-17 NOTE — Telephone Encounter (Signed)
Pt called and stated [facility] had not got her lorazepam, called pharm, it is filled and awaiting pick up, pt informed

## 2017-06-17 NOTE — Progress Notes (Signed)
   06/17/2017  [name] presents for follow up of opioid use disorder I have reviewed the prior induction visit, follow up visits, and telephone encounters relevant to opiate use disorder (OUD) treatment.   Current daily dose: Suboxone 24 mg daily  Date of Induction: 02/26/17  Current follow up interval, in weeks: 2  The patient has not been adherent with the buprenorphine for OUD contract.   HPI:   37 year old woman here for follow-up of severe opioid use disorder. She notes that she has a good week. Spent some time with her children.  She is still having some instability of housing.  Has had some opioid use since last visit, she reports this was intranasal and not injection.  She reports that she has continued suboxone tablets 3 times a day, does not feel the tablets work as well as the films but overall is helping to curve cravings, she notes that her relapses are driven by contact with other PWID, she is trying to remove her self from these people.   Exam:   Vitals:   06/16/17 1201  BP: 129/86  Pulse: 81  Temp: 98 F (36.7 C)  TempSrc: Oral  SpO2: 100%  Weight: 181 lb (82.1 kg)  Height: 5\' 3"  (1.6 m)    Gen: chronically ill appearing woman, disheveled Psych: tearful CV: RRR, no murmurs Ext: warm, no edema   Assessment/Plan:  See Problem Based Charting in the Encounters Tab     [name], DO  06/17/2017  3:07 PM

## 2017-06-17 NOTE — Assessment & Plan Note (Addendum)
Check [place] Reviewed [place] appropriate. She has still struggled to stabilize, we will continue close follow up.

## 2017-06-23 LAB — TOXASSURE SELECT,+ANTIDEPR,UR

## 2017-06-30 ENCOUNTER — Ambulatory Visit (INDEPENDENT_AMBULATORY_CARE_PROVIDER_SITE_OTHER): Payer: Self-pay | Admitting: Student in an Organized Health Care Education/Training Program

## 2017-06-30 ENCOUNTER — Other Ambulatory Visit: Payer: Self-pay | Admitting: *Deleted

## 2017-06-30 ENCOUNTER — Other Ambulatory Visit: Payer: Self-pay

## 2017-06-30 ENCOUNTER — Encounter: Payer: Self-pay | Admitting: Student in an Organized Health Care Education/Training Program

## 2017-06-30 VITALS — BP 125/78 | HR 102 | Temp 98.6°F | Ht 62.5 in | Wt 183.9 lb

## 2017-06-30 DIAGNOSIS — Z638 Other specified problems related to primary support group: Secondary | ICD-10-CM

## 2017-06-30 DIAGNOSIS — F332 Major depressive disorder, recurrent severe without psychotic features: Secondary | ICD-10-CM

## 2017-06-30 DIAGNOSIS — F1120 Opioid dependence, uncomplicated: Secondary | ICD-10-CM

## 2017-06-30 MED ORDER — LORAZEPAM 1 MG PO TABS: 1.0000 mg | ORAL_TABLET | Freq: Two times a day (BID) | ORAL | 0 refills | Status: DC

## 2017-06-30 MED ORDER — BUPRENORPHINE HCL-NALOXONE HCL 8-2 MG SL SUBL: 3.0000 | SUBLINGUAL_TABLET | Freq: Every day | SUBLINGUAL | 0 refills | Status: DC

## 2017-06-30 MED FILL — BUPRENORPHINE HCL-NALOXONE: 8-2 | 14 days supply | Qty: 42 | Fill #0

## 2017-06-30 NOTE — Progress Notes (Signed)
   06/30/2017  [name] [name] presents for follow up of opioid use disorder I have reviewed the prior induction visit, follow up visits, and telephone encounters relevant to opiate use disorder (OUD) treatment.   Current daily dose: Suboxone 24 mg daily  Date of Induction: 02/26/2017  Current follow up interval, in weeks: 2  The patient has not been adherent with the buprenorphine for OUD contract.   Last UDS Result: Appropriate for you and metabolite, levels seem low for her high dosage, also polysubstances, prescribed lorazepam absent  HPI: 37 year old woman with severe opioid use disorder here for follow-up.  She is been on treatments for about 4 months now with maximum dose of Suboxone.  Treatment has been interrupted over the last few months because of insurance interruption.  Currently she is doing poorly, still having frequent relapses, still not being totally honest with [place] in our visits.  She admits to snorting heroin once a few days ago.  Says she does not know why she does it, it is available, her daughter and her partner bring it around her.  Has a lot of social stressors.  She reports compliance with the Suboxone, often 1 tablet in the morning and 2 in the afternoon.  She reports usually not feeling anything when she does use heroin.  She reports compliance with lorazepam over the last few weeks.  Says her anxiety and depression are better controlled as a result.  Exam:   Vitals:   06/30/17 1329  BP: 125/78  Pulse: (!) 102  Temp: 98.6 F (37 C)  TempSrc: Oral  SpO2: 99%  Weight: 183 lb 14.4 oz (83.4 kg)  Height: 5' 2.5" (1.588 m)    General: Well-appearing woman in no distress Skin: Mild erythema on her bilateral palms with some mild peeling Ext: warm and well-perfused with no edema Neuro: Alert and oriented, full strength, normal gait Psych, not depressed or anxious appearing today, more appropriate, not as easily tearful, will put together  Assessment/Plan:  See  Problem Based Charting in the Encounters Tab     [name] Filler, MD  06/30/2017  1:48 PM

## 2017-06-30 NOTE — Assessment & Plan Note (Signed)
Major depressive disorder, doing poorly, worsened by terrible social situation and no social support.  Has been treated with benzodiazepines for a long time, compliance has been intermittent over the last few months.  Treatment interrupted by loss of Medicaid covered I reviewed that her last urine testing did not show the prescribed Lorazepam, she explained that the prescription was lost in the move from a motel.  She assures me that she has been taking it regularly, but will be present in your think if the lorazepam is not presents, I am inclined to stop prescribing as I think there is some risk for diversion here.

## 2017-06-30 NOTE — Patient Instructions (Signed)
Today we set a goal of better compliance with her medicines, that means taking them as directed every day at the doses they are prescribed.  We also set a goal of coming on time to your next appointment on Tuesday morning  in 2 weeks

## 2017-06-30 NOTE — Assessment & Plan Note (Signed)
Severe opioid use disorder, not yet controlled.  Still intermittent relapses.  She reports compliance with Suboxone 3 tablets daily but I am suspicious she is micro-dosing to be able to continue to use heroin ad hoc.  Has very poor social support, her daughter is still a person who injects drugs, often around her, partner is also a person who drinks drugs.  We set a goal today of improving her compliance with the Suboxone, using it at the doses that are intended.  Checking a urine substance test today.  We can continue close follow-up every 2 weeks.  I reviewed the controlled substance reporting system which was appropriate.

## 2017-07-07 LAB — TOXASSURE SELECT,+ANTIDEPR,UR

## 2017-07-14 ENCOUNTER — Other Ambulatory Visit: Payer: Self-pay

## 2017-07-14 ENCOUNTER — Ambulatory Visit (INDEPENDENT_AMBULATORY_CARE_PROVIDER_SITE_OTHER): Payer: Self-pay | Admitting: Internal Medicine

## 2017-07-14 VITALS — BP 124/89 | HR 104 | Temp 98.6°F | Ht 62.0 in | Wt 180.7 lb

## 2017-07-14 DIAGNOSIS — G8929 Other chronic pain: Secondary | ICD-10-CM

## 2017-07-14 DIAGNOSIS — F419 Anxiety disorder, unspecified: Secondary | ICD-10-CM

## 2017-07-14 DIAGNOSIS — F332 Major depressive disorder, recurrent severe without psychotic features: Secondary | ICD-10-CM

## 2017-07-14 DIAGNOSIS — Z79899 Other long term (current) drug therapy: Secondary | ICD-10-CM

## 2017-07-14 DIAGNOSIS — F1120 Opioid dependence, uncomplicated: Secondary | ICD-10-CM

## 2017-07-14 DIAGNOSIS — M5416 Radiculopathy, lumbar region: Secondary | ICD-10-CM

## 2017-07-14 DIAGNOSIS — G629 Polyneuropathy, unspecified: Secondary | ICD-10-CM | POA: Insufficient documentation

## 2017-07-14 DIAGNOSIS — M545 Low back pain: Secondary | ICD-10-CM

## 2017-07-14 MED ORDER — GABAPENTIN 300 MG PO CAPS: 300.0000 mg | ORAL_CAPSULE | Freq: Three times a day (TID) | ORAL | 0 refills | Status: DC

## 2017-07-14 MED ORDER — BUPRENORPHINE HCL-NALOXONE HCL 8-2 MG SL SUBL: 3.0000 | SUBLINGUAL_TABLET | Freq: Every day | SUBLINGUAL | 0 refills | Status: DC

## 2017-07-14 MED ORDER — LORAZEPAM 1 MG PO TABS: 1.0000 mg | ORAL_TABLET | Freq: Two times a day (BID) | ORAL | 0 refills | Status: AC

## 2017-07-14 MED FILL — BUPRENORPHINE HCL-NALOXONE: 8-2 | 14 days supply | Qty: 42 | Fill #0

## 2017-07-14 NOTE — Assessment & Plan Note (Addendum)
She is continued use illicit polysubstances was frank in discussing with her that I do not think that she is doing well in our clinic that we likely will need to arrange for a higher level of care for her given that she has been unable to maintain consistent Suboxone use and abstain from opioid medications.  She reports that desire to continue with Suboxone MAT.  I did discuss with her some options for higher level of care.  I will refill a two-week prescription of Suboxone and I asked her to be strictly adherent to this I we will have her come back in 2 weeks I told her that she will need to be on time for her visit in order to continue MAT with our clinic.  Will check [facility]ox today (patient self reports Polysubstance abuse and absence of Suboxone)

## 2017-07-14 NOTE — Assessment & Plan Note (Signed)
Refilled Gabapentin for her chronic low back pain left leg radicular pain.  Continue 300mg  TID.

## 2017-07-14 NOTE — Progress Notes (Signed)
   07/14/2017  [name] [name] presents for follow up of opioid use disorder I have reviewed the prior induction visit, follow up visits, and telephone encounters relevant to opiate use disorder (OUD) treatment.   Current daily dose: Suboxone 24 mg daily  Date of Induction: 02/26/2017  Current follow up interval, in weeks: 2  The patient has not been adherent with the buprenorphine for OUD contract.   Last UDS Result: low buperenophrine dosage, also polysubstances, Prescribed lorazepam present  HPI: 37 year old woman with severe opioid use disorder here for follow-up.  She is been on treatments for about 4 months now with maximum dose of Suboxone.  Treatment has been interrupted over the last few months because of insurance interruption.  She continues to do very poorly.  She missed her appointment with [place] this morning and I had her come in for a working in the afternoon.  She reports this is due to her boyfriend not being able to get off work on time and as soon as she found that out she gave [place] a call and let us know that she would not be able to make her appointment.  In addition to that she is very tearful on exam she reports that 5 days ago her daughter stole her Suboxone prescription, she has been without any Suboxone for the last 5 days this is caused her to use "[name] [name]" for the first time however she did not have a good experience.  And more recently she notes she went into withdrawal she last used intranasal heroin yesterday.  She reports that she has continued to use her Ativan and this is helped control her anxiety. She is also requesting a refill of gabapentin for her peripheral neuropathy.  Exam:   Vitals:   07/14/17 1327  BP: 124/89  Pulse: (!) 104  Temp: 98.6 F (37 C)  TempSrc: Oral  SpO2: 97%  Weight: 180 lb 11.2 oz (82 kg)  Height: 5\' 2"  (1.575 m)    General: Tearful, anxious Skin: no IV track marks on upper extremites Ext: warm and well-perfused with no edema Neuro:  Alert and oriented Psych, depressed tearful  Assessment/Plan:  See Problem Based Charting in the Encounters Tab  [name] Groves, DO  07/14/2017  1:24 PM

## 2017-07-14 NOTE — Telephone Encounter (Signed)
Needs to speak with a nurse about oud clinic.please call back.

## 2017-07-14 NOTE — Assessment & Plan Note (Signed)
Continue Ativan 1mg  BID and Lexapro 20mg  daily.  Reassess adherence at next visit.  Will likely need to taper off Ativan.

## 2017-07-14 NOTE — Telephone Encounter (Signed)
Returned call to patient. States earliest she can be at clinic is 1140. Discussed with OUD provider and patient given appt at 1:15 today. Hubbard Hartshorn, RN, BSN

## 2017-07-14 NOTE — Patient Instructions (Signed)
[facility] [phone] [place], [place], [place] [phone] [address], [address] [place], [place]  GC Stop  Syringe exchange, assistance with treatment [phone]

## 2017-07-20 LAB — TOXASSURE SELECT,+ANTIDEPR,UR

## 2017-07-28 ENCOUNTER — Telehealth: Payer: Self-pay | Admitting: Family Medicine

## 2017-07-28 NOTE — Telephone Encounter (Signed)
Return call made to patient-no anwswer, HIPPA compliant message left on recorder.Despina Hidden Cassady7/9/201910:39 AM

## 2017-07-28 NOTE — Telephone Encounter (Signed)
Patient scheduled in [facility] tomorrow at 3:45 as there will be 2 OUD Attendings present at that time. Hubbard Hartshorn, RN, BSN

## 2017-07-28 NOTE — Telephone Encounter (Signed)
Pt is unable to go to the [place] clinic, patient doesn't have a Public librarian, patient would like a nurse to call her back to schedule an appt before next Tuesday. Pt contact # [phone]

## 2017-07-29 ENCOUNTER — Encounter: Payer: Self-pay | Admitting: Internal Medicine

## 2017-07-29 ENCOUNTER — Ambulatory Visit (INDEPENDENT_AMBULATORY_CARE_PROVIDER_SITE_OTHER): Payer: Self-pay | Admitting: Internal Medicine

## 2017-07-29 ENCOUNTER — Other Ambulatory Visit: Payer: Self-pay

## 2017-07-29 VITALS — BP 114/65 | HR 93 | Temp 99.3°F | Ht 62.0 in | Wt 185.0 lb

## 2017-07-29 DIAGNOSIS — M545 Low back pain: Secondary | ICD-10-CM

## 2017-07-29 DIAGNOSIS — F419 Anxiety disorder, unspecified: Secondary | ICD-10-CM

## 2017-07-29 DIAGNOSIS — G8929 Other chronic pain: Secondary | ICD-10-CM

## 2017-07-29 DIAGNOSIS — F1120 Opioid dependence, uncomplicated: Secondary | ICD-10-CM

## 2017-07-29 DIAGNOSIS — Z79899 Other long term (current) drug therapy: Secondary | ICD-10-CM

## 2017-07-29 DIAGNOSIS — F331 Major depressive disorder, recurrent, moderate: Secondary | ICD-10-CM

## 2017-07-29 MED ORDER — BUPRENORPHINE HCL-NALOXONE HCL 8-2 MG SL SUBL: 3.0000 | SUBLINGUAL_TABLET | Freq: Every day | SUBLINGUAL | 0 refills | Status: DC

## 2017-07-29 MED ORDER — LORAZEPAM 1 MG PO TABS: 1.0000 mg | ORAL_TABLET | Freq: Two times a day (BID) | ORAL | 0 refills | Status: DC

## 2017-07-29 MED FILL — BUPRENORPHIN-NALOXON 8-2 MG: 8-2 | 14 days supply | Qty: 42 | Fill #0

## 2017-07-29 NOTE — Assessment & Plan Note (Addendum)
-   She has been on suboxone program since 02/2017  - Last urine drug screen on 07/14/2017 showed recent illicit drug use - States she has not used any illicit drug use for 16 days - No withdrawal symptoms on history or physical exam - Continuing to endorse lower back pain, esp w/ heavy lifting - Will repeat urine drug screen today  - Suboxone and Ativan refills  - C/w gabapentin 300mg  TID for back pain - F/u in 2 weeks

## 2017-07-29 NOTE — Patient Instructions (Addendum)
Dear Mrs. Perazzo,  You came to the clinic for follow up appointment for your subxone program. We discussed about your progress with the program so far. Congratulations on staying off your drug use for 16 days. We will see you back in 2 weeks  -Gilberto Better

## 2017-07-29 NOTE — Progress Notes (Signed)
   CC: Opioid use disorder  HPI: [name] is a 37 y.o. F w/ PMH of anxiety, depression, chronic back pain and opioid use disorder presenting to the clinic for suboxone and ativan refills. She states she is tolerating both well without withdrawal symptoms. Her last illicit drug use was 16 days ago. Denies any flu-like symptoms, yawning, rhinorrea, malaise since then. She expresses sadness in having to distance herself from her daughter who is continuing to use heroin. Her boyfriend have recently started suboxone program with [facility] as well and she states that's been very helpful for her sobriety as well. She is asking about possibility of increasing her gabapentin dose because she is continuing to have increased back pain especially during and after heavy lifting while doing yardwork as she is about to sell her house.   Past Medical History:  Diagnosis Date  . Anxiety    klonipin for stress disorder  . Chronic back pain   . Cocaine abuse ([place]) 07/06/2013  . Degenerative disc disease   . Endometriosis    chronic pain  . GERD (gastroesophageal reflux disease)    protonix evenings  . Migraine     Review of Systems: Review of Systems  Constitutional: Negative for chills, fever and malaise/fatigue.  HENT: Negative for congestion, nosebleeds, sinus pain and sore throat.   Eyes: Negative for blurred vision, double vision and pain.  Respiratory: Negative for cough, sputum production, shortness of breath and wheezing.   Cardiovascular: Negative for chest pain, palpitations and leg swelling.  Gastrointestinal: Negative for constipation, diarrhea, nausea and vomiting.  Musculoskeletal: Positive for back pain. Negative for falls and myalgias.  Skin: Negative for itching and rash.  Neurological: Negative for dizziness, tingling, sensory change, weakness and headaches.     Physical Exam: Vitals:   07/29/17 1553  BP: 114/65  Pulse: 93  Temp: 99.3 F (37.4 C)  TempSrc: Oral  SpO2: 97%    Weight: 185 lb (83.9 kg)  Height: 5\' 2"  (1.575 m)    Physical Exam  Constitutional: She is oriented to person, place, and time. She appears well-developed and well-nourished. No distress.  HENT:  Head: Atraumatic.  Mouth/Throat: No oropharyngeal exudate.  Eyes: Pupils are equal, round, and reactive to light. Conjunctivae and EOM are normal. No scleral icterus.  Neck: Normal range of motion. Neck supple. No JVD present.  Cardiovascular: Normal rate, regular rhythm, normal heart sounds and intact distal pulses.  Respiratory: Effort normal and breath sounds normal. No respiratory distress. She has no wheezes.  GI: Soft. Bowel sounds are normal. There is no tenderness. There is no guarding.  Musculoskeletal: Normal range of motion. She exhibits no edema or deformity.  Positive straight leg raise on left. Tender to palpation around L5~S1 region   Neurological: She is alert and oriented to person, place, and time. She has normal reflexes. Coordination normal.  Psychiatric: She has a normal mood and affect. Her behavior is normal. Judgment and thought content normal.    Assessment & Plan:   See Encounters Tab for problem based charting.  Patient seen with Dr. Evette Doffing   -Gilberto Better, PGY1

## 2017-08-03 LAB — TOXASSURE SELECT,+ANTIDEPR,UR

## 2017-08-03 NOTE — Progress Notes (Signed)
Internal Medicine Clinic Attending  I saw and evaluated the patient.  I personally confirmed the key portions of the history and exam documented by Dr. Truman Hayward and I reviewed pertinent patient test results.  The assessment, diagnosis, and plan were formulated together and I agree with the documentation in the resident's note.  Severe OUD that has been hard to treat, and seems to be doing fairly well lately. Still having trouble making our appointments reliably on time. But, urine substance test is appropriate with bupe and metabolite. Will continue with suboxonoe. Also managed today is moderate MDD which is stable on ativan BID, also present in urine. Will continue with ativan and hopefully get her more stable on lexapro. Follow up in 2 weeks.

## 2017-08-11 ENCOUNTER — Encounter: Payer: Self-pay | Admitting: Student in an Organized Health Care Education/Training Program

## 2017-08-11 ENCOUNTER — Other Ambulatory Visit: Payer: Self-pay

## 2017-08-11 ENCOUNTER — Ambulatory Visit (INDEPENDENT_AMBULATORY_CARE_PROVIDER_SITE_OTHER): Payer: Self-pay | Admitting: Student in an Organized Health Care Education/Training Program

## 2017-08-11 VITALS — BP 133/76 | HR 77 | Temp 98.6°F | Ht 62.0 in | Wt 182.0 lb

## 2017-08-11 DIAGNOSIS — G629 Polyneuropathy, unspecified: Secondary | ICD-10-CM

## 2017-08-11 DIAGNOSIS — G8929 Other chronic pain: Secondary | ICD-10-CM

## 2017-08-11 DIAGNOSIS — F332 Major depressive disorder, recurrent severe without psychotic features: Secondary | ICD-10-CM

## 2017-08-11 DIAGNOSIS — Z79899 Other long term (current) drug therapy: Secondary | ICD-10-CM

## 2017-08-11 DIAGNOSIS — M545 Low back pain: Secondary | ICD-10-CM

## 2017-08-11 DIAGNOSIS — M5416 Radiculopathy, lumbar region: Secondary | ICD-10-CM

## 2017-08-11 DIAGNOSIS — F1120 Opioid dependence, uncomplicated: Secondary | ICD-10-CM

## 2017-08-11 DIAGNOSIS — Z560 Unemployment, unspecified: Secondary | ICD-10-CM

## 2017-08-11 MED ORDER — LORAZEPAM 1 MG PO TABS: 1.0000 mg | ORAL_TABLET | Freq: Two times a day (BID) | ORAL | 0 refills | Status: DC

## 2017-08-11 MED ORDER — BUPRENORPHINE HCL-NALOXONE HCL 8-2 MG SL SUBL: 3.0000 | SUBLINGUAL_TABLET | Freq: Every day | SUBLINGUAL | 0 refills | Status: DC

## 2017-08-11 MED ORDER — GABAPENTIN 300 MG PO CAPS
300.0000 mg | ORAL_CAPSULE | Freq: Three times a day (TID) | ORAL | 1 refills | Status: DC
Start: 2017-08-11 — End: 2017-11-03

## 2017-08-11 MED FILL — BUPRENORPHIN-NALOXON 8-2 MG: 8-2 | 14 days supply | Qty: 42 | Fill #0

## 2017-08-11 NOTE — Assessment & Plan Note (Signed)
Depression is stable.  PHQ score of 0 today.  We talked about eventually coming off the Ativan, but she does not think that is a good time for her right now to do that.  I gave her a 3-week supply of Ativan 1 mg twice daily.  Continue with Lexapro as well.

## 2017-08-11 NOTE — Progress Notes (Signed)
   08/11/2017  [name] [name] presents for follow up of opioid use disorder I have reviewed the prior induction visit, follow up visits, and telephone encounters relevant to opiate use disorder (OUD) treatment.   Current daily dose: Suboxone 24 mg daily  Date of Induction: 02/26/17  Current follow up interval, in weeks: 2  The patient has been adherent with the buprenorphine for OUD contract.   Last UDS Result: Appropriate for lorazepam and Suboxone  HPI: 37 year old woman here for follow-up of severe opioid use disorder.  Last saw her 2 weeks ago, reportedly she is doing very well.  Last urine substance screen looked great.  She reports good compliance with Suboxone.  Denies any recent relapses.  She says that her husband getting treatment has been extremely helpful for her own treatment.  Unfortunately her daughter also has opioid use disorder, they have had to distance themselves from her because she is still actively using.  No other recent fevers or chills.  Having some chronic low back pain with radiation down her left thigh.  Currently unemployed, applying for disability.  She reports her depression is well controlled.  Denies any recent alcohol use.  Reports good benefit from Ativan.  Exam:   Vitals:   08/11/17 1050  BP: 133/76  Pulse: 77  Temp: 98.6 F (37 C)  TempSrc: Oral  SpO2: 100%  Weight: 182 lb (82.6 kg)  Height: 5\' 2"  (1.575 m)    Well-appearing woman in no distress Heart: Regular rate and rhythm with no murmurs Lungs: Clear to auscultation throughout Psych: We will put together, not anxious or depressed appearing  Assessment/Plan:  See Problem Based Charting in the Encounters Tab     Axel Filler, MD  08/11/2017  10:50 AM

## 2017-08-11 NOTE — Patient Instructions (Signed)
Keep up the good work.  Take your medications every day.  We will see you back here in 3 weeks.  Call us if you have any trouble in the meantime.

## 2017-08-11 NOTE — Assessment & Plan Note (Signed)
Much better controlled.  Plan to continue with Suboxone 24 mg daily.  Follow-up interval increased to 3 weeks.  I checked the controlled substance reporting system which was appropriate.  Plan to check a urine substance test at next visit.  Overall doing well

## 2017-08-28 MED FILL — BUPRENORPHIN-NALOXON 8-2 MG: 8-2 | 7 days supply | Qty: 21 | Fill #1

## 2017-09-04 ENCOUNTER — Telehealth: Payer: Self-pay | Admitting: Student in an Organized Health Care Education/Training Program

## 2017-09-04 ENCOUNTER — Other Ambulatory Visit: Payer: Self-pay | Admitting: *Deleted

## 2017-09-04 NOTE — Telephone Encounter (Signed)
Called, scheduled appt, request refill

## 2017-09-04 NOTE — Telephone Encounter (Signed)
Pt wouldf like for Bonnita Nasuti to given her a call back.

## 2017-09-04 NOTE — Telephone Encounter (Signed)
Pt is calling back, pls call pt [phone]

## 2017-09-04 NOTE — Telephone Encounter (Signed)
I am going to deny this refill. Cornelius missed our appointment on 8/13. We do not refill over the phone, only in visits on dedicated Post clinic sessions.

## 2017-09-10 ENCOUNTER — Other Ambulatory Visit: Payer: Self-pay

## 2017-09-10 ENCOUNTER — Ambulatory Visit (INDEPENDENT_AMBULATORY_CARE_PROVIDER_SITE_OTHER): Payer: Self-pay | Admitting: Internal Medicine

## 2017-09-10 VITALS — BP 113/71 | HR 84 | Temp 98.7°F | Ht 62.0 in | Wt 182.0 lb

## 2017-09-10 DIAGNOSIS — F1120 Opioid dependence, uncomplicated: Secondary | ICD-10-CM

## 2017-09-10 DIAGNOSIS — F419 Anxiety disorder, unspecified: Secondary | ICD-10-CM

## 2017-09-10 DIAGNOSIS — F332 Major depressive disorder, recurrent severe without psychotic features: Secondary | ICD-10-CM

## 2017-09-10 DIAGNOSIS — Z6379 Other stressful life events affecting family and household: Secondary | ICD-10-CM

## 2017-09-10 DIAGNOSIS — Z79899 Other long term (current) drug therapy: Secondary | ICD-10-CM

## 2017-09-10 MED ORDER — ESCITALOPRAM OXALATE 20 MG PO TABS: 30.0000 mg | ORAL_TABLET | Freq: Every day | ORAL | 2 refills | Status: DC

## 2017-09-10 MED ORDER — LORAZEPAM 1 MG PO TABS: 1.0000 mg | ORAL_TABLET | Freq: Two times a day (BID) | ORAL | 0 refills | Status: DC

## 2017-09-10 MED ORDER — BUPRENORPHINE HCL-NALOXONE HCL 8-2 MG SL SUBL: 3.0000 | SUBLINGUAL_TABLET | Freq: Every day | SUBLINGUAL | 0 refills | Status: DC

## 2017-09-10 MED FILL — BUPRENORPHIN-NALOXON 8-2 MG: 8-2 | 14 days supply | Qty: 42 | Fill #0

## 2017-09-10 NOTE — Progress Notes (Signed)
   09/10/2017  [name] [name] presents for follow up of opioid use disorder I have reviewed the prior induction visit, follow up visits, and telephone encounters relevant to opiate use disorder (OUD) treatment.   Current daily dose: Buprenorphine 24mg  daily  Date of Induction: 02/26/17  Current follow up interval, in weeks: 4 weeks  The patient has been adherent with the buprenorphine for OUD contract.   Last UDS Result: ETOH, lorazepam, buprenorphine  HPI: [name] [name] is a 37yo woman with depression/anxiety and OUD who presents for follow up.  [name] [name] reports that from an OUD perspective she is doing well.  She has some mild cravings over the last week when she stretched her suboxone dosing out due to travel.  She has not used any illicit substances except possibly some marijuana.  She has stressors including her daughter being in jail.  She feels her depression and anxiety are better controlled on the lexapro.  She had a PHQ-9 of 17 down from 22 a month ago.  She continues to take her lorazepam.  We discussed increasing her lexapro today to 30mg  dosing to see if we could decrease her lorazepam at next visit.    Exam:   Vitals:   09/10/17 0932  BP: 113/71  Pulse: 84  Temp: 98.7 F (37.1 C)  TempSrc: Oral  SpO2: 98%  Weight: 182 lb (82.6 kg)  Height: 5\' 2"  (1.575 m)    Gen: well appearing, pleasant, NAD Eyes: Anicteric sclerae, mole at the left lower eye lid which is unchanged Pulm: Breathing comfortably, no audible wheezing Psych: Somewhat depressed mood, but speech is normal.   Assessment/Plan:  See Problem Based Charting in the Encounters Tab     Sid Falcon, MD  09/10/2017  9:55 AM

## 2017-09-10 NOTE — Patient Instructions (Signed)
Ms. Nangle -   For your depression and anxiety, please INCREASE your lexapro to 30mg  daily.  We will try to wean down your ativan next time I see you.    Please call us in 10-12 days to remind [place] to send in the 2nd 2 weeks of your buprenorphine prescription.    Thank you!

## 2017-09-11 ENCOUNTER — Encounter: Payer: Self-pay | Admitting: Internal Medicine

## 2017-09-11 NOTE — Assessment & Plan Note (Signed)
Kiira reports doing well.  She has no complaints today and feels her cravings are well controlled.  She has concomitant depression which makes her recovery complicated.  She is improving on lexapro.   Refill history is appropriate Utox today Return in 4 weeks.

## 2017-09-11 NOTE — Assessment & Plan Note (Signed)
PHQ-9 is improved to 17 today.  She reports some improvement in mood and anxiety.   Plan Increase lexapro to 30mg  daily Consider starting to wean lorazepam at next visit

## 2017-09-16 LAB — TOXASSURE SELECT,+ANTIDEPR,UR

## 2017-09-24 ENCOUNTER — Telehealth: Payer: Self-pay | Admitting: *Deleted

## 2017-09-24 MED ORDER — BUPRENORPHINE HCL-NALOXONE HCL 8-2 MG SL SUBL: 3.0000 | SUBLINGUAL_TABLET | Freq: Every day | SUBLINGUAL | 0 refills | Status: DC

## 2017-09-24 MED FILL — BUPRENORPHINE HCL-NALOXONE: 8-2 | 14 days supply | Qty: 42 | Fill #0

## 2017-09-24 NOTE — Telephone Encounter (Signed)
Patient called in states [name] [name] will only give 2 weeks of suboxone at a time. Last Rx written for 1 month on 09/10/2017. Will need new Rx for 2 weeks sent in. Hubbard Hartshorn, RN, BSN

## 2017-09-24 NOTE — Telephone Encounter (Signed)
Attempted to notify patient of below, however, no answer and VM box is full. Hubbard Hartshorn, RN, BSN

## 2017-09-24 NOTE — Telephone Encounter (Signed)
Ok. U tox from 8/22 visit looks good. She got a two week supply at that time. Because she [facility], she is limited to 2 week supply at a time. I will refill another two weeks now. She has follow up with [place] in 2 more weeks.  We should remind her for the future that we require a 3 day notice to refill medications. So if she calls [place] earlier in the future, there will be less risk of missing her refill.

## 2017-10-06 ENCOUNTER — Other Ambulatory Visit: Payer: Self-pay

## 2017-10-06 ENCOUNTER — Telehealth: Payer: Self-pay | Admitting: *Deleted

## 2017-10-06 ENCOUNTER — Ambulatory Visit (INDEPENDENT_AMBULATORY_CARE_PROVIDER_SITE_OTHER): Payer: Self-pay | Admitting: Internal Medicine

## 2017-10-06 VITALS — BP 156/101 | HR 94 | Temp 98.5°F | Ht 62.5 in | Wt 180.9 lb

## 2017-10-06 DIAGNOSIS — F419 Anxiety disorder, unspecified: Secondary | ICD-10-CM

## 2017-10-06 DIAGNOSIS — Z79899 Other long term (current) drug therapy: Secondary | ICD-10-CM

## 2017-10-06 DIAGNOSIS — Z803 Family history of malignant neoplasm of breast: Secondary | ICD-10-CM

## 2017-10-06 DIAGNOSIS — G629 Polyneuropathy, unspecified: Secondary | ICD-10-CM

## 2017-10-06 DIAGNOSIS — G8929 Other chronic pain: Secondary | ICD-10-CM

## 2017-10-06 DIAGNOSIS — F332 Major depressive disorder, recurrent severe without psychotic features: Secondary | ICD-10-CM

## 2017-10-06 DIAGNOSIS — D22122 Melanocytic nevi of left lower eyelid, including canthus: Secondary | ICD-10-CM

## 2017-10-06 DIAGNOSIS — F1120 Opioid dependence, uncomplicated: Secondary | ICD-10-CM

## 2017-10-06 DIAGNOSIS — N6310 Unspecified lump in the right breast, unspecified quadrant: Secondary | ICD-10-CM

## 2017-10-06 DIAGNOSIS — N6314 Unspecified lump in the right breast, lower inner quadrant: Secondary | ICD-10-CM

## 2017-10-06 MED ORDER — CYCLOBENZAPRINE HCL 5 MG PO TABS: 5.0000 mg | ORAL_TABLET | Freq: Every day | ORAL | 0 refills | Status: DC | PRN

## 2017-10-06 MED ORDER — BUPRENORPHINE HCL-NALOXONE HCL 8-2 MG SL SUBL: 3.0000 | SUBLINGUAL_TABLET | Freq: Every day | SUBLINGUAL | 0 refills | Status: DC

## 2017-10-06 MED ORDER — CYCLOBENZAPRINE HCL 7.5 MG PO TABS: 7.5000 mg | ORAL_TABLET | Freq: Every day | ORAL | 0 refills | Status: DC | PRN

## 2017-10-06 MED FILL — BUPRENORPHIN-NALOXON 8-2 MG: 8-2 | 14 days supply | Qty: 42 | Fill #0

## 2017-10-06 MED FILL — CYCLOBENZAPRINE 5 MG TABLET: 5 | 7 days supply | Qty: 7 | Fill #0

## 2017-10-06 NOTE — Telephone Encounter (Signed)
[facility] outpatient pharmacy will not give more than a 2 week supply of buprenorphine.  This is what was given to her. If she definitely needs a prolonged supply, she will need to get it at another pharmacy before leaving.  I am happy to send refills for her lorazepam to [facility] (which one?).  She needs 2 week follow up at this point, so would be preferred that she makes her 2 week appointment.  If she is in [place] for > 2 weeks, she will need to call the clinic and let us know so that an appropriate plan can be made.

## 2017-10-06 NOTE — Telephone Encounter (Signed)
Pt calls and has multiple issues that she states she did not know or remember to talk to you about at oud appt this am: 1) she states she needs her suboxone to be sent in for 1 month because she will be in new [place] from Monday for appr 2-3 weeks due to legal estate proceedings from a relative 2) she needs both the lorazepam and cyclobenzaprine sent to [facility]- [facility] scripts but I do not see a new one for the lorazepam, she states she can use coupons at [facility]

## 2017-10-06 NOTE — Patient Instructions (Addendum)
[name] [name] - -  You are doing well.   For your back pain, try to increase your gabapentin to 600mg  (2 tablets) at night.  Also try flexeril at night (not at the same time) to see if this helps.  You can continue the aleve.    For your breast mass, please call number to get mammogram and ultrasound scheduled.  I will call you with results when completed.    Continue your buprenorphine as prescribed.  Come back in 4 weeks for your buprenorphine visit.    Thank you!

## 2017-10-06 NOTE — Progress Notes (Signed)
   10/06/2017  [name] [name] presents for follow up of opioid use disorder I have reviewed the prior induction visit, follow up visits, and telephone encounters relevant to opiate use disorder (OUD) treatment.   Current daily dose: Buprenorphine 24mg  daily  Date of Induction: 02/26/17  Current follow up interval, in weeks: 4 weeks  The patient has been adherent with the buprenorphine for OUD contract.   Last UDS Result: appropriate  HPI: [name] [name] is a 37yo woman with OUD who presents for follow up of buprenorphine therapy.  She reports some increased stress recently.  Her daughter has been in jail, and she missed a hearing for her which was very upsetting to her.  She also has worsening pain in her leg from neuropathic pain.  She notes that this is keeping her awake and also waking her up from sleep.  This is a chronic issue for her, dating back at least to 2013.  We discussed her gabapentin and a short course of muscle relaxer which she was amenable to.  She has depression and anxiety for which we recently increased her lexapro and her PHQ-9 is down to 16 today.  We did not discuss weaning her lorazepam, but this should be the next step at next visit.   She noted a new issue of a breast nodule at the lower inner quadrant which she is concerned about.  Her mother had breast cancer and she is concerned about possible cancer.   Well after her visit, she called requesting a refill of lorazepam and her cyclobenzaprine to be filled at another pharmacy.  She also called multiple times insisting that the [facility] outpatient pharmacy give her a month supply of buprenorphine (their policy is 2 week supply only at current pricing for the IM program).  Our staff explained this to her.  I forwarded her prescriptions.  She notes that she will be in [place] for 3-4 weeks dealing with an estate.  Exam:   Vitals:   10/06/17 1147  BP: (!) 156/101  Pulse: 94  Temp: 98.5 F (36.9 C)  TempSrc: Oral  SpO2: 100%    Weight: 180 lb 14.4 oz (82.1 kg)  Height: 5' 2.5" (1.588 m)    General: Alert, oriented, NAD Eyes: She has a small mole at the bottom lid of left eye, stable Breast: She has a very small mobile nodule, about 1cm at the inner outer zone of the right breast.  She has no retraction or skin changes to either breast.  She has no galactorrhea.   Pulm: Breathing comfortably, no wheezing Psych: Tearful when talking about daughter.    Assessment/Plan:  See Problem Based Charting in the Encounters Tab     Sid Falcon, MD  10/06/2017  11:55 AM

## 2017-10-06 NOTE — Telephone Encounter (Signed)
[employer] listed on her profile, [employer] She states she is going to try to find a place that will take coupons for the suboxone, states she may just need to have sig other mail her next script overnight to her Her next appt is 10/15

## 2017-10-06 NOTE — Telephone Encounter (Signed)
Okay, will change to the 5mg  tablets.  Thanks!

## 2017-10-06 NOTE — Telephone Encounter (Signed)
Chris at [facility] called, the flexeril ordered is way too expensive, he said need 5 or 10 flexeril Also they are out of suboxone, will get some tomorrow

## 2017-10-07 ENCOUNTER — Other Ambulatory Visit: Payer: Self-pay | Admitting: Internal Medicine

## 2017-10-07 MED ORDER — CYCLOBENZAPRINE HCL 5 MG PO TABS: 5.0000 mg | ORAL_TABLET | Freq: Every day | ORAL | 0 refills | Status: DC | PRN

## 2017-10-07 NOTE — Telephone Encounter (Signed)
Pt states she gets all her meds from [facility] except for Suboxone. She needs Flexeril change to [facility]. Also states she comes every 4 not 2 weeks to OUD so needs 4 week supply of Suboxone sent to [place]. Thanks

## 2017-10-07 NOTE — Telephone Encounter (Signed)
She has not been being seen every 4 weeks yet.  She was only recently increased to every 3 week appointments and I advised her based on her increased stress and pain complaints that a 2 week appointment would be appropriate.  If she prefers a 3 week appointment due to travel, then that can be arranged.    As for the suboxone, [facility] pharmacy will only dispense suboxone at 2 week supply at the IM Program price.  I have explained this to her before and do not have control over this policy. If I send a 4 week supply, she will be paying full price for the suboxone.  If that is her preference I can send in a 4 week supply, not on the IM program.    I am going to send in her other prescriptions now to [facility].

## 2017-10-07 NOTE — Telephone Encounter (Signed)
Pt saw Dr Daryll Drown yesterday in [name].

## 2017-10-07 NOTE — Telephone Encounter (Signed)
On review of chart, I did write 4 weeks on her information, which is fine for follow up, however, I cannot change the policy at the [facility] outpatient pharmacy.

## 2017-10-07 NOTE — Telephone Encounter (Signed)
Rx done.  Thank you!

## 2017-10-08 NOTE — Telephone Encounter (Signed)
Have tried to call, no vmail

## 2017-10-08 NOTE — Telephone Encounter (Signed)
Called pt, she states that a tech at op pharm told her she would just need to pay 25.00 twice and she could get 1 mon worth of suboxone, I have called chris- pharmacist and he will return my call to verify policy

## 2017-10-08 NOTE — Telephone Encounter (Signed)
Called pt - mailbox is full, unable to leave message.

## 2017-10-12 ENCOUNTER — Encounter: Payer: Self-pay | Admitting: Internal Medicine

## 2017-10-12 DIAGNOSIS — N6310 Unspecified lump in the right breast, unspecified quadrant: Secondary | ICD-10-CM | POA: Insufficient documentation

## 2017-10-12 NOTE — Assessment & Plan Note (Signed)
She is in remission at this time, she has had no relapse in the last 4 weeks.  She is taking her medication as prescribed.   Plan Continue buprenorphine 8-2mg  TID Consider U tox at next visit if needed We signed a new medication contract today Return in 4 weeks.

## 2017-10-12 NOTE — Assessment & Plan Note (Signed)
Would start weaning lorazepam slowly at next visit.

## 2017-10-12 NOTE — Assessment & Plan Note (Signed)
Advised she increase her gabapentin to 600mg  at night, continuing 300mg  BID during the day.  She will also have a short course of cyclobenzaprine for acute flare of pain.  She may benefit from pain management clinic in future if pain continues to flair.

## 2017-10-12 NOTE — Telephone Encounter (Signed)
Have not heard from pt and lm for rtc, she should be gone to [place] now

## 2017-10-12 NOTE — Assessment & Plan Note (Signed)
Mammogram with ultrasound ordered given age and family history.

## 2017-10-19 ENCOUNTER — Other Ambulatory Visit: Payer: Self-pay | Admitting: *Deleted

## 2017-10-19 ENCOUNTER — Other Ambulatory Visit: Payer: Self-pay

## 2017-10-19 MED ORDER — BUPRENORPHINE HCL-NALOXONE HCL 8-2 MG SL SUBL: 3.0000 | SUBLINGUAL_TABLET | Freq: Every day | SUBLINGUAL | 0 refills | Status: DC

## 2017-10-19 MED FILL — BUPRENORPHIN-NALOXON 8-2 MG: 8-2 | 14 days supply | Qty: 42 | Fill #0

## 2017-10-19 NOTE — Telephone Encounter (Signed)
Pt is  Calling back checking on prescription, pls contact [phone]

## 2017-10-19 NOTE — Telephone Encounter (Signed)
Pt is calling back to speak with a nurse about med. Please call pt back.  

## 2017-10-19 NOTE — Telephone Encounter (Signed)
Pt needs to speak with a nurse about meds, please call pt back.

## 2017-10-19 NOTE — Telephone Encounter (Signed)
Sent request to dr's mullen, vincent, hoffman in [place]

## 2017-10-19 NOTE — Telephone Encounter (Signed)
There is no message, did Yaeko call in?

## 2017-10-20 MED ORDER — BUPRENORPHINE HCL-NALOXONE HCL 8-2 MG SL SUBL: 3.0000 | SUBLINGUAL_TABLET | Freq: Every day | SUBLINGUAL | 0 refills | Status: DC

## 2017-10-20 NOTE — Telephone Encounter (Addendum)
buprenorphine-naloxone (SUBOXONE) 8-2 mg SUBL SL tablet   REFILL REQUEST @  [place], [place] - [address] [phone] (Phone) [phone] (Fax)   Pt would like the nurse to call her back.

## 2017-10-20 NOTE — Telephone Encounter (Signed)
Verified with Vincente Liberty at [facility] that Rx is ready to be picked up. Attempted to notify patient, however, no answer and VM box is full. Hubbard Hartshorn, RN, BSN

## 2017-10-20 NOTE — Addendum Note (Signed)
Addended by: Lalla Brothers T on: 10/20/2017 11:26 AM   Modules accepted: Orders

## 2017-10-20 NOTE — Telephone Encounter (Signed)
This has been sent twice, once by me and once by Dr. Evette Doffing.  Can someone call and find out if the pharmacy received it?

## 2017-10-20 NOTE — Telephone Encounter (Signed)
This is an issue with our people getting meds at [facility]. We may send 4 weeks, but they will only give 2 week supplies. So I have been sending in a refill at that 2 week mark for them.

## 2017-11-02 ENCOUNTER — Other Ambulatory Visit: Payer: Self-pay | Admitting: Student in an Organized Health Care Education/Training Program

## 2017-11-02 DIAGNOSIS — G629 Polyneuropathy, unspecified: Secondary | ICD-10-CM

## 2017-11-03 ENCOUNTER — Ambulatory Visit (INDEPENDENT_AMBULATORY_CARE_PROVIDER_SITE_OTHER): Payer: Self-pay | Admitting: Student in an Organized Health Care Education/Training Program

## 2017-11-03 ENCOUNTER — Other Ambulatory Visit: Payer: Self-pay

## 2017-11-03 VITALS — BP 108/67 | HR 107 | Temp 98.1°F | Resp 16 | Wt 178.7 lb

## 2017-11-03 DIAGNOSIS — F1120 Opioid dependence, uncomplicated: Secondary | ICD-10-CM

## 2017-11-03 DIAGNOSIS — G629 Polyneuropathy, unspecified: Secondary | ICD-10-CM

## 2017-11-03 DIAGNOSIS — Z79899 Other long term (current) drug therapy: Secondary | ICD-10-CM

## 2017-11-03 DIAGNOSIS — F332 Major depressive disorder, recurrent severe without psychotic features: Secondary | ICD-10-CM

## 2017-11-03 DIAGNOSIS — L309 Dermatitis, unspecified: Secondary | ICD-10-CM

## 2017-11-03 DIAGNOSIS — F419 Anxiety disorder, unspecified: Secondary | ICD-10-CM

## 2017-11-03 MED ORDER — BUPRENORPHINE HCL-NALOXONE HCL 8-2 MG SL SUBL: 3.0000 | SUBLINGUAL_TABLET | Freq: Every day | SUBLINGUAL | 0 refills | Status: DC

## 2017-11-03 MED ORDER — LORAZEPAM 1 MG PO TABS: 1.0000 mg | ORAL_TABLET | Freq: Two times a day (BID) | ORAL | 0 refills | Status: DC

## 2017-11-03 MED ORDER — GABAPENTIN 300 MG PO CAPS: 300.0000 mg | ORAL_CAPSULE | Freq: Three times a day (TID) | ORAL | 1 refills | Status: DC

## 2017-11-03 MED FILL — BUPRENORPHIN-NALOXON 8-2 MG: 8-2 | 14 days supply | Qty: 42 | Fill #0

## 2017-11-03 NOTE — Assessment & Plan Note (Signed)
Stable. Plan is to continue with lorazepam 1mg  BID and lexapro. I prescribed one month refill of lorazepam. She is still struggling to get in with [facility], reports they have a four month wait list.

## 2017-11-03 NOTE — Progress Notes (Signed)
   11/03/2017  [name] [name] presents for follow up of opioid use disorder I have reviewed the prior induction visit, follow up visits, and telephone encounters relevant to opiate use disorder (OUD) treatment.   Current daily dose: Suboxone 24mg  daily  Date of Induction: 02/26/17  Current follow up interval, in weeks: 4  The patient has been adherent with the buprenorphine for OUD contract.   Last UDS Result: Appropriate  HPI: 37 year old woman here for OUD follow up. She is doing well on current dosing. Reports good compliance and says no recent relapses. A close friend died of presumed overdose a few weeks ago. She reports her anxiety and depression are stable, says the ativan is helpful. Denies any medication side effects.   Exam:   Vitals:   11/03/17 1155  BP: 108/67  Pulse: (!) 107  Resp: 16  Temp: 98.1 F (36.7 C)  SpO2: 100%  Weight: 178 lb 11.2 oz (81.1 kg)    Gen: well appearing woman, no distress CV: RRR, no murmurs Ext: warm and well perfused, no edema Skin: mild dermatitis on the hands Neuro: alert and conversational, normal strength throughout and normal gait Psych: appropriate affect, not anxious or depressed appearing  Assessment/Plan:  See Problem Based Charting in the Encounters Tab   Axel Filler, MD  11/03/2017  1:56 PM

## 2017-11-03 NOTE — Patient Instructions (Signed)
Continue your medicines as prescribed and we will see you in 4 weeks

## 2017-11-03 NOTE — Assessment & Plan Note (Signed)
Stable and doing well. Plan is to continue with generic suboxone 16mg  daily in divided doses. I sent 2 scripts to the outpatient pharmacy for a one month supply. Follow up with [place] in 4 weeks. Check urine substance test today. I checked the controlled database which was appropriate.

## 2017-11-07 LAB — TOXASSURE SELECT,+ANTIDEPR,UR

## 2017-11-17 MED FILL — BUPRENORPHIN-NALOXON 8-2 MG: 8-2 | 14 days supply | Qty: 42 | Fill #0

## 2017-12-01 ENCOUNTER — Other Ambulatory Visit: Payer: Self-pay

## 2017-12-01 ENCOUNTER — Ambulatory Visit (INDEPENDENT_AMBULATORY_CARE_PROVIDER_SITE_OTHER): Payer: Self-pay | Admitting: Internal Medicine

## 2017-12-01 ENCOUNTER — Telehealth: Payer: Self-pay | Admitting: *Deleted

## 2017-12-01 DIAGNOSIS — F1120 Opioid dependence, uncomplicated: Secondary | ICD-10-CM

## 2017-12-01 MED ORDER — BUPRENORPHINE HCL-NALOXONE HCL 8-2 MG SL SUBL: 3.0000 | SUBLINGUAL_TABLET | Freq: Every day | SUBLINGUAL | 0 refills | Status: DC

## 2017-12-01 MED FILL — BUPRENORPHIN-NALOXON 8-2 MG: 8-2 | 14 days supply | Qty: 42 | Fill #0

## 2017-12-01 NOTE — Telephone Encounter (Signed)
Received verbal auth from Dr. Daryll Drown to call in 10 tabs of suboxone to [facility]. This was called to [place] at [facility]. Patient notified and is very Patent attorney. Hubbard Hartshorn, RN, BSN

## 2017-12-01 NOTE — Patient Instructions (Signed)
[name] [name] - -  Thank you for coming in today.  For your buprenorphine, please keep taking the same dose and timing of medication.   Please come back in 4 weeks to see [place].    Thank you!

## 2017-12-01 NOTE — Progress Notes (Signed)
   12/01/2017  [name] presents for follow up of opioid use disorder I have reviewed the prior induction visit, follow up visits, and telephone encounters relevant to opiate use disorder (OUD) treatment.   Current daily dose: 24mg  of suboxone  Date of Induction: 02/26/17  Current follow up interval, in weeks: 4  The patient has been adherent with the buprenorphine for OUD contract.   Last UDS Result: appropriate  HPI: [name] [name] is a 37yo woman with OUD who presents for buprenorphine treatment.  She is doing well.  She is feeling more down because of situations with her kids.  From a drug use standpoint, she reports no opiate use, no heroin use.  She has been having multiple psychosocial stressors including 1 child in jail and custody issues with her other 2 children.  She has had a father figure pass away.  Overall, however, she seems to be doing okay.    Exam:   Vitals:   12/01/17 1136  BP: 123/73  Pulse: (!) 110  Temp: 98.5 F (36.9 C)  TempSrc: Oral  SpO2: 97%  Weight: 186 lb 6.4 oz (84.6 kg)  Height: 5' 2.5" (1.588 m)    General: Alert, NAD Pulm: Breathing comfortably no wheezing Eyes: Anicteric.  She has a chronic mole on left lower eyelid, non inflamed Psych: Somewhat depressed affect, at times tearful.  Assessment/Plan:  See Problem Based Charting in the Encounters Tab     Sid Falcon, MD  12/01/2017  11:59 AM

## 2017-12-01 NOTE — Telephone Encounter (Signed)
Patient walked in stating [facility] will not have suboxone Rx ready till tomorrow at 1 PM. Patient is scheduled to go out of town at 4 AM for 2 weeks. Requesting 10 tablets of suboxone be sent to [facility]. Boyfriend will be joining her in 3 days and will be able to pick up Rx at [facility] and bring to her. She does not want entire Rx switched to [facility] due to cost. Hubbard Hartshorn, RN, BSN

## 2017-12-02 NOTE — Assessment & Plan Note (Signed)
Continue buprenorphine 8mg  TID.  She is doing well and without cravings.  She feels that her daughter being in jail, though hard, has been helpful with avoiding certain groups in her life.    Plan Continue TID dosing of buprenorphine No Utox today [place] CSRS checked and was appropriate.  Return in 4 weeks.

## 2017-12-04 ENCOUNTER — Other Ambulatory Visit: Payer: Self-pay | Admitting: Internal Medicine

## 2017-12-06 ENCOUNTER — Other Ambulatory Visit: Payer: Self-pay | Admitting: Student in an Organized Health Care Education/Training Program

## 2017-12-06 ENCOUNTER — Other Ambulatory Visit: Payer: Self-pay | Admitting: Internal Medicine

## 2017-12-07 ENCOUNTER — Encounter: Payer: Self-pay | Admitting: *Deleted

## 2017-12-14 MED FILL — BUPRENORPHIN-NALOXON 8-2 MG: 8-2 | 14 days supply | Qty: 42 | Fill #0

## 2017-12-28 ENCOUNTER — Encounter: Payer: Self-pay | Admitting: Family Medicine

## 2017-12-28 ENCOUNTER — Telehealth: Payer: Self-pay

## 2017-12-28 ENCOUNTER — Ambulatory Visit: Payer: Self-pay

## 2017-12-28 NOTE — Telephone Encounter (Signed)
Pt called and is requesting an appt in the [place] clinic tomorrow morning.  Per encounter on 12/01/17, pt to return to [facility] clinic in 4 weeks, appt made in [place] clinic tomorrow, 12/29/17 at 1015.  Pt verbalized understanding. SChaplin, RN,BSN

## 2017-12-29 ENCOUNTER — Ambulatory Visit (INDEPENDENT_AMBULATORY_CARE_PROVIDER_SITE_OTHER): Payer: Self-pay | Admitting: Internal Medicine

## 2017-12-29 ENCOUNTER — Other Ambulatory Visit: Payer: Self-pay

## 2017-12-29 VITALS — BP 119/83 | HR 100 | Temp 98.4°F | Ht 62.5 in | Wt 184.0 lb

## 2017-12-29 DIAGNOSIS — F332 Major depressive disorder, recurrent severe without psychotic features: Secondary | ICD-10-CM

## 2017-12-29 DIAGNOSIS — F1120 Opioid dependence, uncomplicated: Secondary | ICD-10-CM

## 2017-12-29 DIAGNOSIS — Z79899 Other long term (current) drug therapy: Secondary | ICD-10-CM

## 2017-12-29 DIAGNOSIS — F419 Anxiety disorder, unspecified: Secondary | ICD-10-CM

## 2017-12-29 MED ORDER — LORAZEPAM 1 MG PO TABS: 1.0000 mg | ORAL_TABLET | Freq: Two times a day (BID) | ORAL | 0 refills | Status: DC

## 2017-12-29 MED ORDER — BUPRENORPHINE HCL-NALOXONE HCL 8-2 MG SL SUBL: 3.0000 | SUBLINGUAL_TABLET | Freq: Every day | SUBLINGUAL | 0 refills | Status: DC

## 2017-12-29 MED ORDER — BUPROPION HCL ER (SR) 150 MG PO TB12: ORAL_TABLET | ORAL | 2 refills | Status: DC

## 2017-12-29 MED ORDER — BUPRENORPHINE HCL-NALOXONE HCL 8-2 MG SL SUBL: 3.0000 | SUBLINGUAL_TABLET | Freq: Every day | SUBLINGUAL | 0 refills | Status: AC

## 2017-12-29 MED ORDER — ESCITALOPRAM OXALATE 20 MG PO TABS: 30.0000 mg | ORAL_TABLET | Freq: Every day | ORAL | 2 refills | Status: DC

## 2017-12-29 MED FILL — BUPRENORPHIN-NALOXON 8-2 MG: 8-2 | 14 days supply | Qty: 42 | Fill #0

## 2017-12-29 NOTE — Patient Instructions (Signed)
I am starting you on Wellbutrin for anxiety and depression.  Take 150mg  daily for 3 days then increase to twice a day.

## 2017-12-29 NOTE — Progress Notes (Signed)
   12/29/2017  [name] presents for follow up of opioid use disorder I have reviewed the prior induction visit, follow up visits, and telephone encounters relevant to opiate use disorder (OUD) treatment.   Current daily dose: 24mg  of suboxone  Date of Induction: 02/26/17  Current follow up interval, in weeks: 4  The patient has been adherent with the buprenorphine for OUD contract.   Last UDS Result: appropriate  HPI: [name] [name] is a 37yo woman with OUD who presents for buprenorphine treatment.  She is doing well.  Reports good adherence and cravings well controlled. Upbeat about holidays, daughter out of jail, wants to get her into OUD treatment.  Depression and anxiety still a problem but moderately controlled.  Continues with lexapro 30mg  daily, ativan 1mg  BID.  Willing to meet with our counselor Miquel Dunn.   GAD-7 unchanged at 15 PHQ-9 slightly down to 13 Exam:   Vitals:   12/29/17 1039  BP: 119/83  Pulse: 100  Temp: 98.4 F (36.9 C)  TempSrc: Oral  SpO2: 98%  Weight: 184 lb (83.5 kg)  Height: 5' 2.5" (1.588 m)    General: Alert, NAD Pulm: Breathing comfortably no wheezing Eyes: Anicteric.  She has a chronic mole on left lower eyelid, non inflamed Psych: good eye contact, not tearful, seems more upbeat  Assessment/Plan:  See Problem Based Charting in the Encounters Tab     [name], DO  12/29/2017  11:37 AM

## 2017-12-29 NOTE — Assessment & Plan Note (Signed)
Continue lexapro 30mg  daily Add wellbutrin 150mg  daily x3 days then increase to 150mg  BID Continue ativan 1g BID >may be able to wean down in future. Meet with [place]/ integrated behavioral health at next visit.

## 2017-12-29 NOTE — Assessment & Plan Note (Signed)
Continue Suboxone 8-2mg  TID, 4 weeks Rx provided.  UDS obtained. Plan follow up in 4 weeks.

## 2018-01-06 LAB — TOXASSURE SELECT,+ANTIDEPR,UR

## 2018-01-11 MED FILL — BUPRENORPHIN-NALOXON 8-2 MG: 8-2 | 14 days supply | Qty: 42 | Fill #0

## 2018-01-26 ENCOUNTER — Telehealth: Payer: Self-pay | Admitting: Student in an Organized Health Care Education/Training Program

## 2018-01-26 MED ORDER — BUPRENORPHINE HCL-NALOXONE HCL 8-2 MG SL SUBL: 3.0000 | SUBLINGUAL_TABLET | Freq: Every day | SUBLINGUAL | 0 refills | Status: DC

## 2018-01-26 MED FILL — BUPRENORPHIN-NALOXON 8-2 MG: 8-2 | 7 days supply | Qty: 21 | Fill #0

## 2018-01-26 NOTE — Telephone Encounter (Signed)
38 year old person living with opioid use disorder presented to her OUD follow-up appointment 1 hour late today.  We were unable to accommodate her today, I prescribed a 1 week supply and she should follow-up with [place] at our next clinic in 1 week.  She will need to show better compliance with appointment times in the future.

## 2018-02-02 ENCOUNTER — Ambulatory Visit (INDEPENDENT_AMBULATORY_CARE_PROVIDER_SITE_OTHER): Payer: Self-pay | Admitting: Internal Medicine

## 2018-02-02 DIAGNOSIS — F1120 Opioid dependence, uncomplicated: Secondary | ICD-10-CM

## 2018-02-02 DIAGNOSIS — F332 Major depressive disorder, recurrent severe without psychotic features: Secondary | ICD-10-CM

## 2018-02-02 DIAGNOSIS — F419 Anxiety disorder, unspecified: Secondary | ICD-10-CM

## 2018-02-02 DIAGNOSIS — Z79899 Other long term (current) drug therapy: Secondary | ICD-10-CM

## 2018-02-02 DIAGNOSIS — H029 Unspecified disorder of eyelid: Secondary | ICD-10-CM

## 2018-02-02 MED ORDER — BUPRENORPHINE HCL-NALOXONE HCL 8-2 MG SL SUBL: 3.0000 | SUBLINGUAL_TABLET | Freq: Every day | SUBLINGUAL | 0 refills | Status: DC

## 2018-02-02 MED ORDER — BUPROPION HCL ER (SR) 150 MG PO TB12: ORAL_TABLET | ORAL | 2 refills | Status: DC

## 2018-02-02 MED FILL — BUPRENORPHIN-NALOXON 8-2 MG: 8-2 | 14 days supply | Qty: 42 | Fill #0

## 2018-02-02 NOTE — Progress Notes (Signed)
   02/02/2018  [name] [name] presents for follow up of opioid use disorder I have reviewed the prior induction visit, follow up visits, and telephone encounters relevant to opiate use disorder (OUD) treatment.   Current daily dose: 8mg  of suboxone TID  Date of Induction: 02/26/17  Current follow up interval, in weeks: 4  The patient has been adherent with the buprenorphine for OUD contract.   Last UDS Result: appropriate  HPI: [name] [name] is a 38 year old woman with OUD who presents for treatment. She is stable on 8mg  of suboxone TID.  She is further on anxiety and depression treatment with [place].  We have been adding medications in the hopes of weaning her benzodiazepine.  She has not been very amenable to this plan.  She requested a change to clonazepam today because she is having increased anxiety and stress at home.  She has not started wellbutrin.  She notes that she has had no relapse.  She feels that the heroin/opiate does not work when she is on buprenorphine so she doesn't see the point.  Exam:   Vitals:   02/02/18 1150  BP: 129/77  Pulse: (!) 104  Temp: 100 F (37.8 C)  TempSrc: Oral  SpO2: 100%  Weight: 181 lb 12.8 oz (82.5 kg)    General: Somewhat tearful, but no distress Eyes: non icteric sclerae, chronic nodule on left lower eyelid, unchanged Pulm: Breathing comfortably Psych: Mood is anxious, otherwise normal affect.   Assessment/Plan:  See Problem Based Charting in the Encounters Tab     [name] Falcon, MD  02/02/2018  12:15 PM

## 2018-02-02 NOTE — Patient Instructions (Signed)
Gold[facility] - -  Thank you for coming in today.   I sent your Wellbutrin in to the [facility].  Please start this medication and see if it helps with your mood.  Please also make an appointment to see our counselor.   Come back to this clinic in 4 weeks.

## 2018-02-05 NOTE — Assessment & Plan Note (Addendum)
She is doing well from this aspect.  She has had no relapse.  Her boyfriend and daughter are currently attempting to get clean as well.  Will continue her buprenorphine at current dose.    Plan Continue Suboxone 8-2mg  TID UDS at next visit if needed PDMP reviewed and appropriate Follow up in 4 weeks

## 2018-02-05 NOTE — Assessment & Plan Note (Signed)
I advised her to pick up the Wellbutrin as recommended by Dr. Heber Strawn at last visit.  We will not change her BZD today.  Once she is on the wellbutrin for 2-4 weeks, will need to start weaning the wellbutrin.  If we are not able to get her stabilized and off of anxiolytic, may need referral to psychiatry.  She declined to see our counselor today.  I think that regular follow up with our counselor should be an expectation of her treatment going forward.    Plan Start Wellbutrin in addition to her other psychiatric medications.

## 2018-02-09 ENCOUNTER — Ambulatory Visit: Payer: Medicaid Other | Admitting: Licensed Clinical Social Worker

## 2018-02-09 ENCOUNTER — Encounter: Payer: Self-pay | Admitting: Licensed Clinical Social Worker

## 2018-02-09 DIAGNOSIS — F419 Anxiety disorder, unspecified: Secondary | ICD-10-CM

## 2018-02-09 DIAGNOSIS — F321 Major depressive disorder, single episode, moderate: Secondary | ICD-10-CM
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
## 2018-02-15 MED FILL — BUPRENORPHIN-NALOXON 8-2 MG: 8-2 | 14 days supply | Qty: 42 | Fill #1

## 2018-02-19 ENCOUNTER — Other Ambulatory Visit: Payer: Self-pay

## 2018-02-19 MED ORDER — LORAZEPAM 1 MG PO TABS: 1.0000 mg | ORAL_TABLET | Freq: Two times a day (BID) | ORAL | 0 refills | Status: DC

## 2018-02-19 NOTE — Telephone Encounter (Signed)
LORazepam (ATIVAN) 1 MG tablet   Refill request @ [facility] on [address].

## 2018-03-02 ENCOUNTER — Telehealth: Payer: Self-pay | Admitting: Family Medicine

## 2018-03-02 ENCOUNTER — Other Ambulatory Visit: Payer: Medicaid Other

## 2018-03-02 ENCOUNTER — Ambulatory Visit: Payer: Self-pay | Admitting: Licensed Clinical Social Worker

## 2018-03-02 ENCOUNTER — Other Ambulatory Visit: Payer: Self-pay | Admitting: Internal Medicine

## 2018-03-02 DIAGNOSIS — F1120 Opioid dependence, uncomplicated: Secondary | ICD-10-CM

## 2018-03-02 MED ORDER — BUPRENORPHINE HCL-NALOXONE HCL 8-2 MG SL SUBL: 3.0000 | SUBLINGUAL_TABLET | Freq: Every day | SUBLINGUAL | 0 refills | Status: AC

## 2018-03-02 MED FILL — BUPRENORPHIN-NALOXON 8-2 MG: 8-2 | 14 days supply | Qty: 42 | Fill #0

## 2018-03-02 NOTE — Telephone Encounter (Signed)
Spoke to pt, came to clinic

## 2018-03-02 NOTE — Telephone Encounter (Signed)
Requesting for [name] [name] to call back [phone]

## 2018-03-02 NOTE — Telephone Encounter (Signed)
Attempted to call, no answer, no vmail

## 2018-03-02 NOTE — Progress Notes (Signed)
Patient arrived after clinic hours for appointment. Will have her rescheduled for next available 2 weeks, will provide interim Rx. But she will need to make more of an effort to arrive on time.

## 2018-03-10 LAB — TOXASSURE SELECT,+ANTIDEPR,UR

## 2018-03-16 ENCOUNTER — Other Ambulatory Visit: Payer: Self-pay | Admitting: *Deleted

## 2018-03-17 ENCOUNTER — Other Ambulatory Visit: Payer: Self-pay

## 2018-03-17 NOTE — Telephone Encounter (Signed)
Needs to speak with a nurse about meds. Please call pt back.  

## 2018-03-17 NOTE — Telephone Encounter (Addendum)
Patient walked in requesting refill on suboxone. States she talked with OUD Nurse yesterday about this but not seeing documentation in chart. Advised patient that this would not be an issue if she showed on time for her appts. States partner's eye glass appt ran over and that was why she was late to yesterday's appt. States she took last suboxone last evening an is not feeling well today. Will route to OUD Attneding Pool for consideration. Hubbard Hartshorn, RN, BSN

## 2018-03-17 NOTE — Telephone Encounter (Signed)
Pt states she need suboxone to be filled by today.

## 2018-03-17 NOTE — Telephone Encounter (Signed)
Received TC from pt, states she was late for her appt yesterday and was not seen in [place] clinic.  States she was told that Suboxone RX would be sent to [facility] outpatient pharmacy to "get her through until her next appt on 03/23/18". States Suboxone not at pharmacy.  Suboxone not listed on active med list.  Will send to Dr. Melene Muller to advise. SChaplin, RN,BSN

## 2018-03-18 ENCOUNTER — Other Ambulatory Visit: Payer: Self-pay | Admitting: *Deleted

## 2018-03-18 MED ORDER — BUPROPION HCL ER (SR) 150 MG PO TB12: ORAL_TABLET | ORAL | 2 refills | Status: DC

## 2018-03-18 MED FILL — BUPROPION HCL SR 150 MG TAB: 150 | 30 days supply | Qty: 60 | Fill #0

## 2018-03-18 NOTE — Telephone Encounter (Signed)
My partners have seen her more recently than me, but this has been a recurring problem over the last few months. Has not shown signs of improvement despite warnings and encouragement. I think we need to set more clear boundaries that she can only continue on treatment if she complies with our appointment times. But my partners may have more insight to her specific issues.

## 2018-03-18 NOTE — Telephone Encounter (Signed)
Spoke w/ dr Evette Doffing about suboxone, it will not be refilled at this time. Due to pt's inability to come to appts and f/u as needed will refer her to [facility] for more intensive program. Will keep her appt for 3/3 and will speak w/ her at that time about the seriousness of situation.

## 2018-03-19 ENCOUNTER — Telehealth: Payer: Self-pay | Admitting: Family Medicine

## 2018-03-19 NOTE — Telephone Encounter (Signed)
Noted. Thanks.

## 2018-03-19 NOTE — Telephone Encounter (Signed)
Pt was calling to see if physician was sending her Suboxene to the pharmacy; pt contact [phone]

## 2018-03-19 NOTE — Telephone Encounter (Signed)
Pt rtc, she was ask to see [facility] or any of the other more intense/ emergent facilities/programs, she stated she did not want the names. She is upset her suboxone is not being refilled this week without a visit. She was agitated and ask why she was "being kicked out", it was explained she is not being kicked out but the situation is not where it needs to be, cancelling at the last minute, not being on time. These are issues that need work. Sympathized with the difficulty of transportation and housing but also emphasized that her md appts are the most important to get to for recovery issues. Stressed her appt tues 3/3 still stands and to try to be open and reasonable in thoughts of what she can do to help get to her goal of sobriety. She said "ok" and hung up.

## 2018-03-19 NOTE — Telephone Encounter (Signed)
Attempted to call pt, no answer, no vmail

## 2018-03-23 ENCOUNTER — Ambulatory Visit (INDEPENDENT_AMBULATORY_CARE_PROVIDER_SITE_OTHER): Payer: Self-pay | Admitting: Internal Medicine

## 2018-03-23 ENCOUNTER — Other Ambulatory Visit: Payer: Self-pay

## 2018-03-23 VITALS — BP 139/73 | HR 98 | Temp 98.6°F | Wt 177.7 lb

## 2018-03-23 DIAGNOSIS — F1120 Opioid dependence, uncomplicated: Secondary | ICD-10-CM

## 2018-03-23 MED ORDER — BUPRENORPHINE HCL-NALOXONE HCL 8-2 MG SL SUBL: 1.0000 | SUBLINGUAL_TABLET | Freq: Three times a day (TID) | SUBLINGUAL | 0 refills | Status: DC

## 2018-03-23 MED ORDER — LORAZEPAM 1 MG PO TABS: 1.0000 mg | ORAL_TABLET | Freq: Two times a day (BID) | ORAL | 0 refills | Status: DC

## 2018-03-23 MED FILL — BUPRENORPHIN-NALOXON 8-2 MG: 8-2 | 14 days supply | Qty: 42 | Fill #0

## 2018-03-23 MED FILL — LORazepam 1 MG TABS: 1 | 30 days supply | Qty: 60 | Fill #0

## 2018-03-24 NOTE — Progress Notes (Signed)
   03/24/2018  [name] [name] presents for follow up of opioid use disorder I have reviewed the prior induction visit, follow up visits, and telephone encounters relevant to opiate use disorder (OUD) treatment.   Current daily dose: 8mg  of suboxone TID  Date of Induction: 02/26/17  Current follow up interval, in weeks: 4  The patient has NOT been adherent with the buprenorphine for OUD contract.   Last UDS Result: appropriate for buprenorphine but also cocaine and THC  HPI: [name] [name] is a 38 year old woman with OUD who presents for treatment.  She has now been in treatment with our clinic for over a year.  She had previously been doing pretty well however over the past month she has been missing multiple appointments.  About 3 weeks ago she arrived hours after her scheduled appointment I sent in 2-week Rx for Suboxone for her to continue in therapy.  However on follow-up she also missed that appointment.  She called that day requesting a prescription and this was denied.  She reports that she did relapse to heroin use 4 days and reported that her last use was yesterday, she also reports that he suspects that heroin was laced with meth as she felt unexpectedly energetic with use.  She reports her missed appointments were due to moving and transportation issues she thinks that this will no longer be a problem as she is now on a bus line and does not depend on her significant other as much for transportation.  Exam:   Vitals:   03/23/18 1137  BP: 139/73  Pulse: 98  Temp: 98.6 F (37 C)  SpO2: 100%  Weight: 177 lb 11.2 oz (80.6 kg)    General: no distress, mildly anxious Eyes: non icteric sclerae, chronic nodule on left lower eyelid, unchanged Skin: no gooseflesh, tract marks on right arm, no erythema or tenderness Pulm: Breathing comfortably Psych: Mood is anxious, otherwise normal affect.   Assessment/Plan:  See Problem Based Charting in the Encounters Tab     [name],  DO  03/24/2018  2:17 PM

## 2018-03-24 NOTE — Assessment & Plan Note (Signed)
Unfortunately patient seems to have had full relapse to heroin use.  Rooming she told our nurse that it was our fault for lack of prescribing the medication.  She did not relay this same concern to me and seemed somewhat apologetic about missing her appointments.  I did print out her IUD contract and we discussed that missing appointments and requesting the refills after she ran out was not appropriate.  If she has continued behavior like this we would likely need to refer her to a more intensive program.  In the interim I would like to see her a little more frequently to reestablish close monitoring.  I will refill her Suboxone 8 mg 3 times daily and have her follow-up in 2 weeks.

## 2018-03-29 LAB — TOXASSURE SELECT,+ANTIDEPR,UR

## 2018-04-06 ENCOUNTER — Other Ambulatory Visit: Payer: Self-pay

## 2018-04-06 ENCOUNTER — Ambulatory Visit (INDEPENDENT_AMBULATORY_CARE_PROVIDER_SITE_OTHER): Payer: Self-pay | Admitting: Student in an Organized Health Care Education/Training Program

## 2018-04-06 VITALS — BP 153/96 | HR 114 | Temp 98.0°F | Wt 186.3 lb

## 2018-04-06 DIAGNOSIS — F1120 Opioid dependence, uncomplicated: Secondary | ICD-10-CM

## 2018-04-06 DIAGNOSIS — R1011 Right upper quadrant pain: Secondary | ICD-10-CM | POA: Insufficient documentation

## 2018-04-06 DIAGNOSIS — G629 Polyneuropathy, unspecified: Secondary | ICD-10-CM

## 2018-04-06 MED ORDER — GABAPENTIN 300 MG PO CAPS: 300.0000 mg | ORAL_CAPSULE | Freq: Three times a day (TID) | ORAL | 1 refills | Status: DC

## 2018-04-06 MED ORDER — BUPRENORPHINE HCL-NALOXONE HCL 8-2 MG SL SUBL: 1.0000 | SUBLINGUAL_TABLET | Freq: Three times a day (TID) | SUBLINGUAL | 0 refills | Status: DC

## 2018-04-06 MED ORDER — ESCITALOPRAM OXALATE 20 MG PO TABS: 30.0000 mg | ORAL_TABLET | Freq: Every day | ORAL | 2 refills | Status: DC

## 2018-04-06 MED FILL — BUPRENORPHIN-NALOXON 8-2 MG: 8-2 | 14 days supply | Qty: 42 | Fill #0 | Status: TO

## 2018-04-06 MED FILL — GABAPENTIN 300 MG CAPSULE: 300 | 30 days supply | Qty: 90 | Fill #0 | Status: TO

## 2018-04-06 MED FILL — ESCITALOPRAM 20 MG TABLET: 20 | 30 days supply | Qty: 45 | Fill #0

## 2018-04-06 NOTE — Assessment & Plan Note (Signed)
Difficult to control over the last several months.  Frequents missed appointments.  Still using injection heroin on occasion.  Very poor social determinants of health at home.  We will continue with Suboxone 8 mg tablets 3 times daily.  I provided a 1 month supply given we are having restrictions on routine follow-up visits due to the COVID-19 pandemic.  Hopefully those restrictions will be lifted and we will be able to see her in follow-up in a month.

## 2018-04-06 NOTE — Progress Notes (Signed)
   04/06/2018  [name] [name] presents for follow up of opioid use disorder I have reviewed the prior induction visit, follow up visits, and telephone encounters relevant to opiate use disorder (OUD) treatment.   Current daily dose: Suboxone 24 mg daily  Current follow up interval, in weeks: 2-4  The patient has not been adherent with the buprenorphine for OUD contract.   Last UDS Result: Polysubstances present  HPI: [name] [name] with opioid use disorder who we see in consultation for medication-assisted treatment.  Reconducted on 3/3 with Suboxone and says doing okay from that standpoint.  Acute complaint today is of right upper quadrant pain for 24 hours.  Sudden onset.  Sharp pain, unremitting.  Waxing waning.  Reported a fever 2 days ago to 100.8.  No nausea or vomiting.  No diarrhea.  No cough or respiratory symptoms.  Last injection drug use was 12 days ago.  Exam:   Vitals:   04/06/18 1031  BP: (!) 153/96  Pulse: (!) 114  Temp: 98 F (36.7 C)  TempSrc: Oral  SpO2: 97%  Weight: 186 lb 4.8 oz (84.5 kg)    General: Mildly ill-appearing, no distress, slow to get up onto the exam table Abdomen: Obese, soft, tenderness to palpation of the right upper quadrant, positive Murphy sign.  No distention, no rebound or guarding Extremities warm and well-perfused with no lower extremity edema.  Assessment/Plan:  See Problem Based Charting in the Encounters Tab    Axel Filler, MD  04/06/2018  11:17 AM

## 2018-04-06 NOTE — Patient Instructions (Signed)
For your abdominal pain, I want you to go to the emergency department. Your pain may be due to a gall bladder stone, which needs to have an ultrasound to evaluate it. Given your recent fever there is also a risk of infection which could be serious.

## 2018-04-06 NOTE — Assessment & Plan Note (Signed)
Acute right upper quadrant pain, unremitting, recent fever, significant tenderness and positive Murphy sign on exam consistent with acute cholecystitis.  Patient appears nontoxic.  Could be choledocholithiasis without infection.  She is at higher risk for underlying infection due to injection drug use, last injection 12 days ago, so I do not think it is playing a role here.  I advised her to go to the emergency department given the high risk of infection for a abdominal ultrasound to be done urgently.  Unfortunately patient has no primary care physician, no insurance, so I do not think we can accomplish this as an outpatient reliably today.

## 2018-04-08 ENCOUNTER — Other Ambulatory Visit: Payer: Self-pay

## 2018-04-08 ENCOUNTER — Emergency Department (HOSPITAL_COMMUNITY)
Admission: EM | Admit: 2018-04-08 | Discharge: 2018-04-09 | Discharge status: Against Medical Advice | Payer: Medicaid Other | Attending: Emergency Medicine | Admitting: Emergency Medicine

## 2018-04-08 DIAGNOSIS — Z5321 Procedure and treatment not carried out due to patient leaving prior to being seen by health care provider: Secondary | ICD-10-CM | POA: Insufficient documentation

## 2018-04-08 DIAGNOSIS — R109 Unspecified abdominal pain: Secondary | ICD-10-CM | POA: Insufficient documentation

## 2018-04-08 NOTE — ED Triage Notes (Signed)
Pt states that her Dr. Recommended that pt come to the ED for abdominal pain and possible gall stones. Pt has recently had a fever. Pt states that she has also recently missed her suboxone appointment and that she has gone without it for a week and relapsed but she was hurting too bad to start taking it again.

## 2018-04-09 NOTE — ED Notes (Signed)
Call pt.x4 no answer

## 2018-04-10 ENCOUNTER — Emergency Department (HOSPITAL_COMMUNITY): Payer: Medicaid Other

## 2018-04-10 ENCOUNTER — Other Ambulatory Visit: Payer: Self-pay

## 2018-04-10 ENCOUNTER — Encounter (HOSPITAL_COMMUNITY): Payer: Self-pay | Admitting: *Deleted

## 2018-04-10 ENCOUNTER — Emergency Department (HOSPITAL_COMMUNITY)
Admission: EM | Admit: 2018-04-10 | Discharge: 2018-04-10 | Disposition: A | Discharge status: Home / Self Care | Payer: Medicaid Other | Attending: Emergency Medicine | Admitting: Emergency Medicine

## 2018-04-10 DIAGNOSIS — R112 Nausea with vomiting, unspecified: Secondary | ICD-10-CM | POA: Diagnosis not present

## 2018-04-10 DIAGNOSIS — R1011 Right upper quadrant pain: Secondary | ICD-10-CM

## 2018-04-10 DIAGNOSIS — H11433 Conjunctival hyperemia, bilateral: Secondary | ICD-10-CM | POA: Diagnosis not present

## 2018-04-10 DIAGNOSIS — Z79899 Other long term (current) drug therapy: Secondary | ICD-10-CM | POA: Insufficient documentation

## 2018-04-10 DIAGNOSIS — F17210 Nicotine dependence, cigarettes, uncomplicated: Secondary | ICD-10-CM | POA: Diagnosis not present

## 2018-04-10 LAB — COMPREHENSIVE METABOLIC PANEL
ALBUMIN: 3.4 g/dL — AB (ref 3.5–5.0)
ALK PHOS: 121 U/L (ref 38–126)
ALT: 39 U/L (ref 0–44)
ANION GAP: 7 (ref 5–15)
AST: 25 U/L (ref 15–41)
BUN: 12 mg/dL (ref 6–20)
CO2: 26 mmol/L (ref 22–32)
Calcium: 9.1 mg/dL (ref 8.9–10.3)
Chloride: 103 mmol/L (ref 98–111)
Creatinine, Ser: 0.6 mg/dL (ref 0.44–1.00)
GFR calc Af Amer: >60 mL/min (ref >60)
GFR calc non Af Amer: >60 mL/min (ref >60)
GLUCOSE: 92 mg/dL (ref 70–99)
Potassium: 4 mmol/L (ref 3.5–5.1)
Sodium: 136 mmol/L (ref 135–145)
Total Bilirubin: 0.5 mg/dL (ref 0.3–1.2)
Total Protein: 8 g/dL (ref 6.5–8.1)

## 2018-04-10 LAB — CBC
HCT: 37.9 % (ref 36.0–46.0)
HEMOGLOBIN: 12.1 g/dL (ref 12.0–15.0)
MCH: 30.3 pg (ref 26.0–34.0)
MCHC: 31.9 g/dL (ref 30.0–36.0)
MCV: 95 fL (ref 80.0–100.0)
Platelets: 289 10*3/uL (ref 150–400)
RBC: 3.99 MIL/uL (ref 3.87–5.11)
RDW: 13.2 % (ref 11.5–15.5)
WBC: 7.4 10*3/uL (ref 4.0–10.5)
nRBC: 0 % (ref 0.0–0.2)

## 2018-04-10 LAB — RAPID URINE DRUG SCREEN, HOSP PERFORMED
Amphetamines: POSITIVE — AB
Barbiturates: NOT DETECTED
Benzodiazepines: NOT DETECTED
Cocaine: NOT DETECTED
Opiates: POSITIVE — AB
Tetrahydrocannabinol: NOT DETECTED

## 2018-04-10 LAB — URINALYSIS, ROUTINE W REFLEX MICROSCOPIC
Bilirubin Urine: NEGATIVE
Glucose, UA: NEGATIVE mg/dL
Hgb urine dipstick: NEGATIVE
Ketones, ur: NEGATIVE mg/dL
Leukocytes,Ua: NEGATIVE
Nitrite: NEGATIVE
Protein, ur: 30 mg/dL — AB
Specific Gravity, Urine: 1.028 (ref 1.005–1.030)
pH: 5 (ref 5.0–8.0)

## 2018-04-10 LAB — LIPASE, BLOOD: Lipase: 28 U/L (ref 11–51)

## 2018-04-10 MED ORDER — IOPAMIDOL (ISOVUE-300) INJECTION 61%
100.0000 mL | Freq: Once | INTRAVENOUS | Status: AC | PRN
  Administered 2018-04-10: 100 mL via INTRAVENOUS

## 2018-04-10 MED ORDER — SODIUM CHLORIDE (PF) 0.9 % IJ SOLN
INTRAMUSCULAR | Status: AC
  Filled 2018-04-10: qty 50

## 2018-04-10 MED ORDER — IOHEXOL 300 MG/ML  SOLN
100.0000 mL | Freq: Once | INTRAMUSCULAR | Status: AC | PRN
  Administered 2018-04-10: 100 mL via INTRAVENOUS

## 2018-04-10 MED ORDER — SODIUM CHLORIDE 0.9% FLUSH: 3.0000 mL | Freq: Once | INTRAVENOUS | Status: DC

## 2018-04-10 NOTE — ED Provider Notes (Signed)
Assumed care of patient at shift change, see previous note for complete H&P. Briefly, 38yo female with RUQ pain. Labs reassuring, pending [place], if negative then follow up with CT. Physical Exam  BP 118/80 (BP Location: Left Arm)   Pulse 94   Temp 98.8 F (37.1 C) (Oral)   Resp 18   Ht 5\' 2"  (1.575 m)   Wt 81.6 kg   LMP 08/10/2011   SpO2 97%   BMI 32.92 kg/m   Physical Exam  ED Course/Procedures     Procedures  MDM  US unremarkable, no gallstones or cholecystitis.  Ultrasound was followed by CT abdomen pelvis with contrast which is also normal, no explanation for patient's pain today.  Upon entering patient's room to discuss results, patient is sleeping, patient wakes easily and begins holding her right upper quadrant and rocking while hyperventilating.  Discussed patient's results, normal lab work, reassuring CT scan and ultrasound and recommend she follow-up with her PCP or GI. Patient then able to settle and begins texting, no evidence of further pain/discomfort. Patient given referral to GI for follow up.  UA appears contaminated, no urinary symptoms, do not recommend treatment at this time.       Tacy Learn, PA-C 04/10/18 0859    Shanon Rosser, MD 04/10/18 2236

## 2018-04-10 NOTE — Discharge Instructions (Addendum)
Follow up with your doctor or see gastroenterology, referral given.

## 2018-04-10 NOTE — ED Provider Notes (Signed)
[facility] DEPT Provider Note   CSN: [id number] Arrival date & time: 04/10/18  0230    History   Chief Complaint Chief Complaint  Patient presents with  . Abdominal Pain    HPI [name] [name] is a 38 y.o. female with a hx of anxiety, chronic back pain, polysubstance abuse, GERD presents to the Emergency Department complaining of gradual, persistent, progressively worsening right upper quadrant abdominal pain onset 5 days ago with associated nausea and vomiting.  Additionally patient reports associated fever to 100.8 at home.  Patient was evaluated by her primary care provider for Suboxone prescription on 04/06/2018.  At that time she had mild abdominal distention, right upper quadrant pain and reports a fever.  She was referred to the emergency department for an ultrasound but did not present here until tonight.  Patient reports she had financial concerns about this.  Additionally she reports she is continued to use heroin, other opioids and other drugs.  She states she is taking her Suboxone at this time.  Patient reports that movement, palpation and walking make her pain worse.  Nothing seems to make her pain better.  As recent travel or known sick contacts.  She denies diarrhea, bloody emesis, melena or hematochezia.  She denies headache, neck pain, chest pain, shortness of breath, weakness, dizziness, syncope, dysuria, hematuria.        The history is provided by the patient and medical records. No language interpreter was used.    Past Medical History:  Diagnosis Date  . Anxiety    klonipin for stress disorder  . Chronic back pain   . Cocaine abuse ([place]) 07/06/2013  . Degenerative disc disease   . Endometriosis    chronic pain  . GERD (gastroesophageal reflux disease)    protonix evenings  . Migraine     Patient Active Problem List   Diagnosis Date Noted  . RUQ pain 04/06/2018  . Breast mass, right 10/12/2017  . Neuropathy 07/14/2017  . 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 Opioid use disorder, severe, dependence ([place]) 09/22/2014  . Alcohol use disorder 01/04/2012    Past Surgical History:  Procedure Laterality Date  . ABDOMINAL HYSTERECTOMY    . APPENDECTOMY    . LAPAROSCOPIC ASSISTED VAGINAL HYSTERECTOMY  08/19/2011   Procedure: LAPAROSCOPIC ASSISTED VAGINAL HYSTERECTOMY;  Surgeon: Gus Height, MD;  Location: [facility];  Service: Gynecology;  Laterality: N/A;  . TONSILLECTOMY    . TUBAL LIGATION       OB History    Gravida  6   Para  2   Term      Preterm      AB      Living        SAB      TAB      Ectopic      Multiple      Live Births               Home Medications    Prior to Admission medications   Medication Sig Start Date End Date Taking? Authorizing Provider  buprenorphine-naloxone (SUBOXONE) 8-2 mg SUBL SL tablet Place 1 tablet under the tongue 3 (three) times daily. 04/06/18  Yes Axel Filler, MD  buPROPion [facility] SR) 150 MG 12 hr tablet Take 150 mg by mouth 2 (two) times daily.   Yes [provider]  escitalopram (LEXAPRO) 20 MG tablet Take 1.5 tablets (30 mg total) by mouth daily. 04/06/18 07/05/18  Yes Axel Filler, MD  gabapentin (NEURONTIN) 300 MG capsule Take 1 capsule (300 mg total) by mouth 3 (three) times daily. Patient taking differently: Take 300 mg by mouth 2 (two) times daily.  04/06/18  Yes Axel Filler, MD  LORazepam (ATIVAN) 1 MG tablet Take 1 tablet (1 mg total) by mouth 2 (two) times daily. 03/23/18  Yes [name], DO  Multiple Vitamin (MULTIVITAMIN WITH MINERALS) TABS tablet Take 1 tablet by mouth daily.   Yes [provider]  naproxen sodium (ALEVE) 220 MG tablet Take 440 mg by mouth 2 (two) times daily as needed (pain).   Yes [provider]  buPROPion (WELLBUTRIN SR) 150 MG 12 hr tablet Take 1 tablet (150 mg total) by mouth daily for 3 days, THEN 1 tablet  (150 mg total) 2 (two) times daily for 27 days. Patient not taking: Reported on 04/10/2018 03/18/18 04/17/18  Sid Falcon, MD  cyclobenzaprine (FLEXERIL) 5 MG tablet Take 1 tablet (5 mg total) by mouth daily as needed for muscle spasms. Patient not taking: Reported on 04/10/2018 10/07/17   Sid Falcon, MD    Family History Family History  Problem Relation Age of Onset  . Coronary artery disease Father   . Coronary artery disease Maternal Grandmother   . Breast cancer Mother   . Cancer Mother     Social History Social History   Tobacco Use  . Smoking status: Current Every Day Smoker    Packs/day: 1.00    Years: 14.00    Pack years: 14.00    Types: Cigarettes  . Smokeless tobacco: Never Used  . Tobacco comment: Smoking 1 PPD  Substance Use Topics  . Alcohol use: No  . Drug use: Yes    Types: Heroin    Comment: denies     Allergies   Epidural tray 17gx3-1-2" [nerve block tray]; Ibuprofen; Zofran [ondansetron hcl]; and Penicillins   Review of Systems Review of Systems  Constitutional: Positive for fever. Negative for appetite change, diaphoresis, fatigue and unexpected weight change.  HENT: Negative for mouth sores.   Eyes: Negative for visual disturbance.  Respiratory: Negative for cough, chest tightness, shortness of breath and wheezing.   Cardiovascular: Negative for chest pain.  Gastrointestinal: Positive for abdominal pain, nausea and vomiting. Negative for constipation and diarrhea.  Endocrine: Negative for polydipsia, polyphagia and polyuria.  Genitourinary: Negative for dysuria, frequency, hematuria and urgency.  Musculoskeletal: Negative for back pain and neck stiffness.  Skin: Negative for rash.  Allergic/Immunologic: Negative for immunocompromised state.  Neurological: Negative for syncope, light-headedness and headaches.  Hematological: Does not bruise/bleed easily.  Psychiatric/Behavioral: Negative for sleep disturbance. The patient is not  nervous/anxious.      Physical Exam Updated Vital Signs BP (!) 127/94 (BP Location: Left Arm)   Pulse 96   Temp 98.8 F (37.1 C) (Oral)   Resp 16   Ht 5\' 2"  (1.575 m)   Wt 81.6 kg   LMP 08/10/2011   SpO2 98%   BMI 32.92 kg/m   Physical Exam Vitals signs and nursing note reviewed.  Constitutional:      General: She is sleeping. She is not in acute distress.    Appearance: She is well-developed. She is not diaphoretic.     Comments: Patient is sleeping.  On arousal her words are slurred and she appears to be under the influence.  With continued arousal, she is able to orient, answer questions and give a history.  HENT:  Head: Normocephalic and atraumatic.     Mouth/Throat:     Pharynx: No oropharyngeal exudate.  Eyes:     General: No scleral icterus.    Conjunctiva/sclera:     Right eye: Right conjunctiva is injected.     Left eye: Left conjunctiva is injected.     Comments: Constricted pupils  Neck:     Musculoskeletal: Normal range of motion and neck supple.  Cardiovascular:     Rate and Rhythm: Normal rate and regular rhythm.  Pulmonary:     Effort: Pulmonary effort is normal. No respiratory distress.     Breath sounds: Normal breath sounds. No wheezing.  Abdominal:     General: Bowel sounds are decreased. There is distension.     Palpations: Abdomen is soft. There is no mass.     Tenderness: There is abdominal tenderness in the right upper quadrant. There is guarding and rebound. There is no right CVA tenderness or left CVA tenderness. Positive signs include Murphy's sign.  Musculoskeletal: Normal range of motion.  Skin:    General: Skin is warm and dry.     Comments: Back marks running along bilateral arms and hands  Neurological:     Comments: Speech is clear and goal oriented Moves extremities without ataxia      ED Treatments / Results  Labs (all labs ordered are listed, but only abnormal results are displayed) Labs Reviewed  COMPREHENSIVE  METABOLIC PANEL - Abnormal; Notable for the following components:      Result Value   Albumin 3.4 (*)    All other components within normal limits  LIPASE, BLOOD  CBC  URINALYSIS, ROUTINE W REFLEX MICROSCOPIC  RAPID URINE DRUG SCREEN, HOSP PERFORMED    Procedures Procedures (including critical care time)  Medications Ordered in ED Medications  sodium chloride flush (NS) 0.9 % injection 3 mL (3 mLs Intravenous Not Given 04/10/18 0423)     Initial Impression / Assessment and Plan / ED Course  I have reviewed the triage vital signs and the nursing notes.  Pertinent labs & imaging results that were available during my care of the patient were reviewed by me and considered in my medical decision making (see chart for details).        Patient presents with reports of fevers and right upper quadrant abdominal pain.  She does have a history of IV drug abuse and multiple track marks on her arm.  She is afebrile here in the emergency department.  Patient does have abdominal distention and significant right upper quadrant abdominal pain with rebound and guarding.  Labs are reassuring.  Patient without leukocytosis, elevation in AST/ALT or lipase.  Still concern for cholelithiasis, cholecystitis, cholangitis persist.  Will obtain right upper quadrant abdominal ultrasound.  If this is negative patient will need CT scan of her abdomen.  My exam, patient sleeping in the room.  When she is aroused, she reports 10 out of 10 pain.  She is not hypertensive or tachycardic at this time.  Will hold on pain control as she appears to currently be under the influence.  6:13 AM At shift change care was transferred to Suella Broad, PA-C who will follow pending studies, re-evaulate and determine disposition.     Final Clinical Impressions(s) / ED Diagnoses   Final diagnoses:  RUQ abdominal pain    ED Discharge Orders    None       Loni Muse Gwenlyn Perking 04/10/18 4268    Molpus, Jenny Reichmann, MD  04/10/18 [phone]

## 2018-04-10 NOTE — ED Triage Notes (Addendum)
Pt c/o abd pain, was seen Tuesday by IM.  Was told to go to the ED for [place] but pt stated " I didn't have the money.  The pain is worse now.  I go to a clinic for suboxone but I didn't take it because it doesn't help the pain.  I've been taking my boyfriend's pain meds.  My stomach is swollen.  My last BM was Tuesday but usually go on 2 times a week."  Pt presents with distended abd.  C/o pain worse to RUQ.

## 2018-04-10 NOTE — ED Notes (Signed)
Patient transported to CT 

## 2018-04-12 ENCOUNTER — Telehealth: Payer: Self-pay | Admitting: Student in an Organized Health Care Education/Training Program

## 2018-04-12 NOTE — Telephone Encounter (Signed)
Spoke by phone with [name] [name] regarding persistent abdominal pain.  I first saw this last week during our routine OUD visit.  We see her in consultation for management of opioid use disorder.  She does not have a primary care physician.  I referred her to the emergency department for evaluation with ultrasound to rule out cholecystitis.  She was at [facility] on 3/21, ultrasound was normal followed by a normal CT scan.  No other diagnosis made by the ED provider.  Blood work also looked normal.  She reports persistent pain, very bothersome, having difficulty getting around.  Denies fever, she is eating and drinking well, no vomiting, no diarrhea.  She is very distressed by this pain.  Because the [facility] is not her primary care clinic I did not offer her an [facility] visit.  I told her I am not sure what the cause of her pain is.  It is reassuring she has had normal imaging.  I advised that if there is a change in her status like worsening symptoms, she should go back to the emergency department or an urgent care center for a second evaluation.  However if she is doing okay at home, I advised supportive care and give it time.  Patient understands, I answered her other questions.  Advised to be very careful and to stay on the Suboxone, any withdrawal would make it worse.  These acute pain generators also are big triggers for relapsing which I advised would only make any underlying problem worse.  I think this advice struck home with her, we will follow-up with her regarding her opioid use disorder soon.  She was also given a referral to gastroenterology from the emergency department which she can follow-up with.  Lalla Brothers, MD

## 2018-04-17 MED FILL — BUPRENORPHIN-NALOXON 8-2 MG: 8-2 | 14 days supply | Qty: 42 | Fill #0

## 2018-04-19 ENCOUNTER — Telehealth: Payer: Self-pay | Admitting: Student in an Organized Health Care Education/Training Program

## 2018-04-19 NOTE — Telephone Encounter (Signed)
Pt states she is no better since 04/10/2018.  Stomach is hurting worse, swollen more.  Patient requesting a call back.
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
## 2018-04-20 NOTE — Telephone Encounter (Signed)
Called pt - no answer; left message to call the office . 

## 2018-04-20 NOTE — Telephone Encounter (Signed)
Patient with no primary care physician.  We manage the opioid use disorder only.  To my knowledge, Flexeril is not an appropriate treatment for abdominal pain.  Recent emergency department visit with reassuring ultrasound and CT scan.  If she would like another evaluation of her abdominal pain, that is reasonable especially if it is changing, but she will have to utilize the urgent care center.

## 2018-04-23 ENCOUNTER — Other Ambulatory Visit: Payer: Self-pay | Admitting: Internal Medicine

## 2018-04-23 MED FILL — GABAPENTIN 300 MG CAPSULE: 300 | 30 days supply | Qty: 90 | Fill #0

## 2018-04-26 ENCOUNTER — Other Ambulatory Visit: Payer: Self-pay | Admitting: Internal Medicine

## 2018-04-26 MED ORDER — LORAZEPAM 1 MG PO TABS: 1.0000 mg | ORAL_TABLET | Freq: Two times a day (BID) | ORAL | 0 refills | Status: DC

## 2018-04-26 MED FILL — LORazepam 1 MG TABS: 1 | 30 days supply | Qty: 60 | Fill #0

## 2018-04-26 NOTE — Telephone Encounter (Signed)
Also received fax refill request from [facility] for lorazepam 1mg . Will send to OUD attending pool for consideration.Despina Hidden Cassady4/6/202012:10 PM

## 2018-04-26 NOTE — Telephone Encounter (Signed)
This was sent to pharm today

## 2018-04-26 NOTE — Telephone Encounter (Signed)
Refill Request-Pt is aware of the   [facility]  LORazepam (ATIVAN) 1 MG tablet

## 2018-04-30 ENCOUNTER — Telehealth: Payer: Self-pay | Admitting: Internal Medicine

## 2018-04-30 MED ORDER — BUPRENORPHINE HCL-NALOXONE HCL 8-2 MG SL SUBL: 1.0000 | SUBLINGUAL_TABLET | Freq: Three times a day (TID) | SUBLINGUAL | 0 refills | Status: DC

## 2018-04-30 MED FILL — BUPRENORPHIN-NALOXON 8-2 MG: 8-2 | 14 days supply | Qty: 42 | Fill #0

## 2018-04-30 NOTE — Telephone Encounter (Addendum)
   Reason for call:   I received a call from Ms. [name] [name] at 8:45 PM indicating that she is in need of an early refill of her suboxone, which is prescribed to her in out OUD clinic.   Pertinent Data:   Patient is requesting an earlier refill (would be due in about 6 days) so she can go to [place] to help take care of her mother for a period of time who has been hospitalized due to a fall and who is due to be discharged from the hospital soon.  She anticipates being out of state for about 1 month.  Spoke with attending, Dr. Heber Weston, who has seen patient in the [facility] who will be able to refill the medication early if it is allowed by the pharmacy.  [facility] states they can refill the medication early, but request a note to pharmacy in the Rx statin it is okay to do so.   Assessment / Plan / Recommendations:   Dr. Heber Ballard (who authorized to prescribe suboxone) will provide 1 month refill of Suboxone, 6 days early.    [name], MD   04/30/2018, 4:08 PM

## 2018-04-30 NOTE — Telephone Encounter (Signed)
Spoke with Dr Trilby Drummer, will provide 6 day early refill.

## 2018-05-07 ENCOUNTER — Telehealth: Payer: Self-pay | Admitting: Internal Medicine

## 2018-05-07 NOTE — Telephone Encounter (Signed)
Attempted to call patient concerning refill of medications.  No answer.

## 2018-05-15 MED FILL — BUPRENORPHIN-NALOXON 8-2 MG: 8-2 | 14 days supply | Qty: 42 | Fill #1

## 2018-05-25 ENCOUNTER — Other Ambulatory Visit: Payer: Self-pay | Admitting: Internal Medicine

## 2018-05-27 ENCOUNTER — Other Ambulatory Visit: Payer: Self-pay | Admitting: Internal Medicine

## 2018-05-27 ENCOUNTER — Telehealth: Payer: Self-pay | Admitting: Internal Medicine

## 2018-05-27 MED ORDER — BUPRENORPHINE HCL-NALOXONE HCL 8-2 MG SL SUBL: 1.0000 | SUBLINGUAL_TABLET | Freq: Three times a day (TID) | SUBLINGUAL | 0 refills | Status: DC

## 2018-05-27 MED FILL — BUPRENORPHIN-NALOXON 8-2 MG: 8-2 | 14 days supply | Qty: 42 | Fill #0

## 2018-05-27 MED FILL — LORazepam 1 MG TABS: 1 | 30 days supply | Qty: 60 | Fill #0

## 2018-05-27 NOTE — Telephone Encounter (Signed)
Can you please call patient and get her set up for a telehealth visit?  Let me know what time is available for her in an afternoon slot.  No further refills until appointment.   Thank you!

## 2018-05-27 NOTE — Telephone Encounter (Signed)
Refill Request  [facility] - [place], [place] - [place] buprenorphine-naloxone (SUBOXONE) 8-2 mg SUBL SL tablet

## 2018-05-27 NOTE — Telephone Encounter (Signed)
Called patient, no answer.  Left [facility] compliant voicemail.   Will send in 1 month supply.  Patient will need telehealth visit prior to any further refills of medications.  Please call patient and schedule for telehealth visit with myself or Dr. Heber [name].  If she does not schedule or does not answer phone, will start weaning lorazepam down.   Gilles Chiquito, MD

## 2018-05-27 NOTE — Telephone Encounter (Signed)
Called patient back after she had spoke with Dr. Shan Levans.   Ms. Lye reports that her grandmother is ill and she is flying to see her.  She had not called in to the clinic b/c the prescription for buprenorphine she has stated that she had one more refill, but the pharmacy reported that she only had a 6 day supply called in.  She plans to be gone for 10 days.  She notes that she has had no heroin use since we last spoke with her.  She has had no further relapse.  She is just concerned about her grandmother.  I advised her that we would start having in person appointments again soon and she seemed happy with this plan to follow up.   Plan Refill buprenorphine.  Next visit should be in person in early June if possible.   Gilles Chiquito, MD

## 2018-05-28 NOTE — Telephone Encounter (Signed)
Called pt to schedule a tele health visit per Dr Daryll Drown - pt is agreeable. Call transferred to front office. Telehealth visit scheduled on Tues 5/12.

## 2018-05-28 NOTE — Telephone Encounter (Signed)
Done

## 2018-06-10 ENCOUNTER — Other Ambulatory Visit: Payer: Self-pay | Admitting: Student in an Organized Health Care Education/Training Program

## 2018-06-10 DIAGNOSIS — G629 Polyneuropathy, unspecified: Secondary | ICD-10-CM

## 2018-06-10 MED FILL — BUPRENORPHIN-NALOXON 8-2 MG: 8-2 | 14 days supply | Qty: 42 | Fill #1

## 2018-08-24 ENCOUNTER — Ambulatory Visit: Payer: Medicaid Other

## 2018-08-24 ENCOUNTER — Encounter: Payer: Self-pay | Admitting: Student in an Organized Health Care Education/Training Program

## 2018-08-27 ENCOUNTER — Telehealth: Payer: Self-pay | Admitting: Student in an Organized Health Care Education/Training Program

## 2018-08-27 NOTE — Telephone Encounter (Signed)
Patient called in requesting an appt for [facility]UD clinic.  In comments states she has been dismissed..  An appt was not scheduled and patient informed that she would be getting a call back.

## 2018-08-27 NOTE — Telephone Encounter (Signed)
Ok. Thank you.

## 2018-09-13 ENCOUNTER — Telehealth: Payer: Self-pay | Admitting: *Deleted

## 2018-09-13 NOTE — Telephone Encounter (Signed)
Pt calls and states someone was to call her for an appt and no one has called back. She would like an appt. She is informed that she has been dismissed from [facility]. She states she has not gotten the letter so can she have an appt, she is informed no and offered the ph# of the possible places for treatment, she states no she will look them up.she disconnected call at this time. I have added an accurate address to her demographics. Sending to front office to resend letter

## 2018-09-14 NOTE — Telephone Encounter (Signed)
Ok. Thank you.

## 2018-10-20 ENCOUNTER — Encounter (HOSPITAL_COMMUNITY): Payer: Self-pay | Admitting: *Deleted

## 2018-10-20 ENCOUNTER — Inpatient Hospital Stay (HOSPITAL_COMMUNITY): Payer: Medicaid Other | Admitting: Anesthesiology

## 2018-10-20 ENCOUNTER — Emergency Department (HOSPITAL_COMMUNITY): Payer: Medicaid Other

## 2018-10-20 ENCOUNTER — Inpatient Hospital Stay (HOSPITAL_COMMUNITY): Payer: Medicaid Other

## 2018-10-20 ENCOUNTER — Inpatient Hospital Stay (HOSPITAL_COMMUNITY)
Admission: EM | Admit: 2018-10-20 | Discharge: 2018-11-08 | DRG: 853 | Disposition: A | Discharge status: Home / Self Care | Payer: Medicaid Other | Attending: Internal Medicine | Admitting: Internal Medicine

## 2018-10-20 ENCOUNTER — Inpatient Hospital Stay (HOSPITAL_COMMUNITY)
Admission: EM | Disposition: A | Discharge status: Home / Self Care | Payer: Self-pay | Source: Home / Self Care | Attending: Internal Medicine

## 2018-10-20 DIAGNOSIS — F329 Major depressive disorder, single episode, unspecified: Secondary | ICD-10-CM | POA: Diagnosis not present

## 2018-10-20 DIAGNOSIS — Z5329 Procedure and treatment not carried out because of patient's decision for other reasons: Secondary | ICD-10-CM | POA: Diagnosis not present

## 2018-10-20 DIAGNOSIS — F1190 Opioid use, unspecified, uncomplicated: Secondary | ICD-10-CM

## 2018-10-20 DIAGNOSIS — K08409 Partial loss of teeth, unspecified cause, unspecified class: Secondary | ICD-10-CM | POA: Diagnosis present

## 2018-10-20 DIAGNOSIS — A4102 Sepsis due to Methicillin resistant Staphylococcus aureus: Principal | ICD-10-CM | POA: Diagnosis present

## 2018-10-20 DIAGNOSIS — G061 Intraspinal abscess and granuloma: Secondary | ICD-10-CM | POA: Diagnosis present

## 2018-10-20 DIAGNOSIS — B9561 Methicillin susceptible Staphylococcus aureus infection as the cause of diseases classified elsewhere: Secondary | ICD-10-CM | POA: Diagnosis not present

## 2018-10-20 DIAGNOSIS — L743 Miliaria, unspecified: Secondary | ICD-10-CM

## 2018-10-20 DIAGNOSIS — Y92003 Bedroom of unspecified non-institutional (private) residence as the place of occurrence of the external cause: Secondary | ICD-10-CM | POA: Diagnosis not present

## 2018-10-20 DIAGNOSIS — R202 Paresthesia of skin: Secondary | ICD-10-CM

## 2018-10-20 DIAGNOSIS — G9520 Unspecified cord compression: Secondary | ICD-10-CM

## 2018-10-20 DIAGNOSIS — N319 Neuromuscular dysfunction of bladder, unspecified: Secondary | ICD-10-CM | POA: Diagnosis present

## 2018-10-20 DIAGNOSIS — I361 Nonrheumatic tricuspid (valve) insufficiency: Secondary | ICD-10-CM | POA: Diagnosis not present

## 2018-10-20 DIAGNOSIS — Z95828 Presence of other vascular implants and grafts: Secondary | ICD-10-CM | POA: Diagnosis not present

## 2018-10-20 DIAGNOSIS — L740 Miliaria rubra: Secondary | ICD-10-CM | POA: Diagnosis not present

## 2018-10-20 DIAGNOSIS — K219 Gastro-esophageal reflux disease without esophagitis: Secondary | ICD-10-CM | POA: Diagnosis present

## 2018-10-20 DIAGNOSIS — J869 Pyothorax without fistula: Secondary | ICD-10-CM | POA: Diagnosis not present

## 2018-10-20 DIAGNOSIS — M545 Low back pain: Secondary | ICD-10-CM

## 2018-10-20 DIAGNOSIS — F419 Anxiety disorder, unspecified: Secondary | ICD-10-CM | POA: Diagnosis not present

## 2018-10-20 DIAGNOSIS — R339 Retention of urine, unspecified: Secondary | ICD-10-CM | POA: Diagnosis present

## 2018-10-20 DIAGNOSIS — F1990 Other psychoactive substance use, unspecified, uncomplicated: Secondary | ICD-10-CM

## 2018-10-20 DIAGNOSIS — Z419 Encounter for procedure for purposes other than remedying health state, unspecified: Secondary | ICD-10-CM

## 2018-10-20 DIAGNOSIS — T1490XA Injury, unspecified, initial encounter: Secondary | ICD-10-CM

## 2018-10-20 DIAGNOSIS — J90 Pleural effusion, not elsewhere classified: Secondary | ICD-10-CM | POA: Diagnosis present

## 2018-10-20 DIAGNOSIS — F339 Major depressive disorder, recurrent, unspecified: Secondary | ICD-10-CM | POA: Diagnosis not present

## 2018-10-20 DIAGNOSIS — B9562 Methicillin resistant Staphylococcus aureus infection as the cause of diseases classified elsewhere: Secondary | ICD-10-CM | POA: Diagnosis not present

## 2018-10-20 DIAGNOSIS — L299 Pruritus, unspecified: Secondary | ICD-10-CM | POA: Diagnosis not present

## 2018-10-20 DIAGNOSIS — K5900 Constipation, unspecified: Secondary | ICD-10-CM | POA: Diagnosis present

## 2018-10-20 DIAGNOSIS — R7881 Bacteremia: Secondary | ICD-10-CM | POA: Diagnosis not present

## 2018-10-20 DIAGNOSIS — F1123 Opioid dependence with withdrawal: Secondary | ICD-10-CM | POA: Diagnosis not present

## 2018-10-20 DIAGNOSIS — R531 Weakness: Secondary | ICD-10-CM

## 2018-10-20 DIAGNOSIS — F17210 Nicotine dependence, cigarettes, uncomplicated: Secondary | ICD-10-CM | POA: Diagnosis present

## 2018-10-20 DIAGNOSIS — G8929 Other chronic pain: Secondary | ICD-10-CM

## 2018-10-20 DIAGNOSIS — Z960 Presence of urogenital implants: Secondary | ICD-10-CM | POA: Diagnosis not present

## 2018-10-20 DIAGNOSIS — R21 Rash and other nonspecific skin eruption: Secondary | ICD-10-CM | POA: Diagnosis not present

## 2018-10-20 DIAGNOSIS — R509 Fever, unspecified: Secondary | ICD-10-CM

## 2018-10-20 DIAGNOSIS — I76 Septic arterial embolism: Secondary | ICD-10-CM

## 2018-10-20 DIAGNOSIS — Z884 Allergy status to anesthetic agent status: Secondary | ICD-10-CM | POA: Diagnosis not present

## 2018-10-20 DIAGNOSIS — D72829 Elevated white blood cell count, unspecified: Secondary | ICD-10-CM | POA: Diagnosis not present

## 2018-10-20 DIAGNOSIS — Z20828 Contact with and (suspected) exposure to other viral communicable diseases: Secondary | ICD-10-CM | POA: Diagnosis present

## 2018-10-20 DIAGNOSIS — Z886 Allergy status to analgesic agent status: Secondary | ICD-10-CM | POA: Diagnosis not present

## 2018-10-20 DIAGNOSIS — Z888 Allergy status to other drugs, medicaments and biological substances status: Secondary | ICD-10-CM | POA: Diagnosis not present

## 2018-10-20 DIAGNOSIS — W06XXXA Fall from bed, initial encounter: Secondary | ICD-10-CM | POA: Diagnosis present

## 2018-10-20 DIAGNOSIS — M546 Pain in thoracic spine: Secondary | ICD-10-CM | POA: Diagnosis not present

## 2018-10-20 DIAGNOSIS — Z880 Allergy status to penicillin: Secondary | ICD-10-CM

## 2018-10-20 HISTORY — PX: THORACIC LAMINECTOMY FOR EPIDURAL ABSCESS: SHX6115

## 2018-10-20 LAB — CBC WITH DIFFERENTIAL/PLATELET
Abs Immature Granulocytes: 0.14 10*3/uL — ABNORMAL HIGH (ref 0.00–0.07)
Basophils Absolute: 0 10*3/uL (ref 0.0–0.1)
Basophils Relative: 0 %
Eosinophils Absolute: 0.2 10*3/uL (ref 0.0–0.5)
Eosinophils Relative: 1 %
HCT: 38.7 % (ref 36.0–46.0)
Hemoglobin: 12.9 g/dL (ref 12.0–15.0)
Immature Granulocytes: 1 %
Lymphocytes Relative: 15 %
Lymphs Abs: 2.9 10*3/uL (ref 0.7–4.0)
MCH: 28.7 pg (ref 26.0–34.0)
MCHC: 33.3 g/dL (ref 30.0–36.0)
MCV: 86 fL (ref 80.0–100.0)
Monocytes Absolute: 1 10*3/uL (ref 0.1–1.0)
Monocytes Relative: 5 %
Neutro Abs: 16 10*3/uL — ABNORMAL HIGH (ref 1.7–7.7)
Neutrophils Relative %: 78 %
Platelets: 580 10*3/uL — ABNORMAL HIGH (ref 150–400)
RBC: 4.5 MIL/uL (ref 3.87–5.11)
RDW: 14.8 % (ref 11.5–15.5)
WBC: 20.3 10*3/uL — ABNORMAL HIGH (ref 4.0–10.5)
nRBC: 0 % (ref 0.0–0.2)

## 2018-10-20 LAB — COMPREHENSIVE METABOLIC PANEL
ALT: 25 U/L (ref 0–44)
AST: 36 U/L (ref 15–41)
Albumin: 2.6 g/dL — ABNORMAL LOW (ref 3.5–5.0)
Alkaline Phosphatase: 338 U/L — ABNORMAL HIGH (ref 38–126)
Anion gap: 13 (ref 5–15)
BUN: 12 mg/dL (ref 6–20)
CO2: 23 mmol/L (ref 22–32)
Calcium: 9 mg/dL (ref 8.9–10.3)
Chloride: 94 mmol/L — ABNORMAL LOW (ref 98–111)
Creatinine, Ser: 0.59 mg/dL (ref 0.44–1.00)
GFR calc Af Amer: >60 mL/min (ref >60)
GFR calc non Af Amer: >60 mL/min (ref >60)
Glucose, Bld: 119 mg/dL — ABNORMAL HIGH (ref 70–99)
Potassium: 4 mmol/L (ref 3.5–5.1)
Sodium: 130 mmol/L — ABNORMAL LOW (ref 135–145)
Total Bilirubin: 1.1 mg/dL (ref 0.3–1.2)
Total Protein: 8.3 g/dL — ABNORMAL HIGH (ref 6.5–8.1)

## 2018-10-20 LAB — RAPID URINE DRUG SCREEN, HOSP PERFORMED
Amphetamines: POSITIVE — AB
Barbiturates: NOT DETECTED
Benzodiazepines: NOT DETECTED
Cocaine: NOT DETECTED
Opiates: POSITIVE — AB
Tetrahydrocannabinol: NOT DETECTED

## 2018-10-20 LAB — SEDIMENTATION RATE: Sed Rate: 98 mm/hr — ABNORMAL HIGH (ref 0–22)

## 2018-10-20 LAB — URINALYSIS, ROUTINE W REFLEX MICROSCOPIC
Bacteria, UA: NONE SEEN
Bilirubin Urine: NEGATIVE
Glucose, UA: NEGATIVE mg/dL
Hgb urine dipstick: NEGATIVE
Ketones, ur: NEGATIVE mg/dL
Leukocytes,Ua: NEGATIVE
Nitrite: NEGATIVE
Protein, ur: 30 mg/dL — AB
Specific Gravity, Urine: 1.014 (ref 1.005–1.030)
pH: 6 (ref 5.0–8.0)

## 2018-10-20 LAB — I-STAT BETA HCG BLOOD, ED (MC, WL, AP ONLY): I-stat hCG, quantitative: <5 m[IU]/mL

## 2018-10-20 LAB — SARS CORONAVIRUS 2 BY RT PCR (HOSPITAL ORDER, PERFORMED IN ~~LOC~~ HOSPITAL LAB): SARS Coronavirus 2: NEGATIVE

## 2018-10-20 LAB — LACTIC ACID, PLASMA: Lactic Acid, Venous: 1 mmol/L (ref 0.5–1.9)

## 2018-10-20 SURGERY — THORACIC LAMINECTOMY FOR EPIDURAL ABSCESS
Anesthesia: General | Site: Back

## 2018-10-20 MED ORDER — SODIUM CHLORIDE 0.9 % IV SOLN
INTRAVENOUS | Status: DC | PRN
  Administered 2018-10-20: 23:00:00 25 ug/min via INTRAVENOUS

## 2018-10-20 MED ORDER — OXYCODONE HCL 5 MG PO TABS: 5.0000 mg | ORAL_TABLET | Freq: Once | ORAL | Status: DC | PRN

## 2018-10-20 MED ORDER — VANCOMYCIN HCL 10 G IV SOLR
1250.0000 mg | Freq: Two times a day (BID) | INTRAVENOUS | Status: DC
  Filled 2018-10-20: qty 1250

## 2018-10-20 MED ORDER — PHENYLEPHRINE 40 MCG/ML (10ML) SYRINGE FOR IV PUSH (FOR BLOOD PRESSURE SUPPORT)
PREFILLED_SYRINGE | INTRAVENOUS | Status: DC | PRN
  Administered 2018-10-20: 120 ug via INTRAVENOUS

## 2018-10-20 MED ORDER — LACTATED RINGERS IV SOLN
INTRAVENOUS | Status: DC | PRN
  Administered 2018-10-20 – 2018-10-21 (×2): via INTRAVENOUS

## 2018-10-20 MED ORDER — FENTANYL CITRATE (PF) 100 MCG/2ML IJ SOLN: 25.0000 ug | INTRAMUSCULAR | Status: DC | PRN

## 2018-10-20 MED ORDER — THROMBIN 20000 UNITS EX SOLR
CUTANEOUS | Status: DC | PRN
  Administered 2018-10-20: 20 mL via TOPICAL

## 2018-10-20 MED ORDER — SODIUM CHLORIDE 0.9 % IV SOLN
2.0000 g | Freq: Once | INTRAVENOUS | Status: DC
  Filled 2018-10-20: qty 20

## 2018-10-20 MED ORDER — GADOBUTROL 1 MMOL/ML IV SOLN
8.0000 mL | Freq: Once | INTRAVENOUS | Status: AC | PRN
  Administered 2018-10-20: 20:00:00 8 mL via INTRAVENOUS

## 2018-10-20 MED ORDER — IOHEXOL 300 MG/ML  SOLN
100.0000 mL | Freq: Once | INTRAMUSCULAR | Status: AC | PRN
  Administered 2018-10-20: 100 mL via INTRAVENOUS

## 2018-10-20 MED ORDER — DEXAMETHASONE SODIUM PHOSPHATE 10 MG/ML IJ SOLN
INTRAMUSCULAR | Status: DC | PRN
  Administered 2018-10-20: 4 mg via INTRAVENOUS

## 2018-10-20 MED ORDER — MIDAZOLAM HCL 5 MG/5ML IJ SOLN
INTRAMUSCULAR | Status: DC | PRN
  Administered 2018-10-20: 2 mg via INTRAVENOUS

## 2018-10-20 MED ORDER — ACETAMINOPHEN 650 MG RE SUPP: 650.0000 mg | Freq: Four times a day (QID) | RECTAL | Status: DC | PRN

## 2018-10-20 MED ORDER — THROMBIN 5000 UNITS EX SOLR
CUTANEOUS | Status: AC
  Filled 2018-10-20: qty 5000

## 2018-10-20 MED ORDER — ACETAMINOPHEN 325 MG PO TABS: 650.0000 mg | ORAL_TABLET | Freq: Four times a day (QID) | ORAL | Status: DC | PRN

## 2018-10-20 MED ORDER — SUCCINYLCHOLINE CHLORIDE 200 MG/10ML IV SOSY
PREFILLED_SYRINGE | INTRAVENOUS | Status: DC | PRN
  Administered 2018-10-20: 100 mg via INTRAVENOUS

## 2018-10-20 MED ORDER — FENTANYL CITRATE (PF) 250 MCG/5ML IJ SOLN
INTRAMUSCULAR | Status: DC | PRN
  Administered 2018-10-20: 50 ug via INTRAVENOUS
  Administered 2018-10-20: 100 ug via INTRAVENOUS
  Administered 2018-10-21 (×7): 50 ug via INTRAVENOUS

## 2018-10-20 MED ORDER — LACTATED RINGERS IV SOLN
INTRAVENOUS | Status: DC
  Administered 2018-10-20: 22:00:00 via INTRAVENOUS

## 2018-10-20 MED ORDER — OXYCODONE HCL 5 MG/5ML PO SOLN: 5.0000 mg | Freq: Once | ORAL | Status: DC | PRN

## 2018-10-20 MED ORDER — HYDROMORPHONE HCL 1 MG/ML IJ SOLN
1.0000 mg | Freq: Four times a day (QID) | INTRAMUSCULAR | Status: DC | PRN
  Administered 2018-10-20: 1 mg via INTRAVENOUS
  Filled 2018-10-20: qty 1

## 2018-10-20 MED ORDER — METRONIDAZOLE IN NACL 5-0.79 MG/ML-% IV SOLN
500.0000 mg | Freq: Three times a day (TID) | INTRAVENOUS | Status: DC
  Administered 2018-10-21 – 2018-10-22 (×6): 500 mg via INTRAVENOUS
  Filled 2018-10-20 (×6): qty 100

## 2018-10-20 MED ORDER — ROCURONIUM BROMIDE 10 MG/ML (PF) SYRINGE
PREFILLED_SYRINGE | INTRAVENOUS | Status: DC | PRN
  Administered 2018-10-20: 50 mg via INTRAVENOUS
  Administered 2018-10-21: 10 mg via INTRAVENOUS

## 2018-10-20 MED ORDER — MIDAZOLAM HCL 2 MG/2ML IJ SOLN
INTRAMUSCULAR | Status: AC
  Filled 2018-10-20: qty 2

## 2018-10-20 MED ORDER — PROPOFOL 10 MG/ML IV BOLUS
INTRAVENOUS | Status: DC | PRN
  Administered 2018-10-20: 190 mg via INTRAVENOUS
  Administered 2018-10-21: 20 mg via INTRAVENOUS

## 2018-10-20 MED ORDER — 0.9 % SODIUM CHLORIDE (POUR BTL) OPTIME
TOPICAL | Status: DC | PRN
  Administered 2018-10-20: 1000 mL

## 2018-10-20 MED ORDER — KETAMINE HCL 50 MG/ML IJ SOLN
INTRAMUSCULAR | Status: DC | PRN
  Administered 2018-10-20: 30 mg via INTRAMUSCULAR
  Administered 2018-10-20: 20 mg via INTRAMUSCULAR

## 2018-10-20 MED ORDER — SODIUM CHLORIDE 0.9 % IV SOLN
INTRAVENOUS | Status: DC | PRN
  Administered 2018-10-20: 500 mL

## 2018-10-20 MED ORDER — KETAMINE HCL 50 MG/5ML IJ SOSY
PREFILLED_SYRINGE | INTRAMUSCULAR | Status: AC
  Filled 2018-10-20: qty 5

## 2018-10-20 MED ORDER — BUPIVACAINE HCL 0.25 % IJ SOLN
INTRAMUSCULAR | Status: DC | PRN
  Administered 2018-10-20: 3 mL

## 2018-10-20 MED ORDER — THROMBIN 20000 UNITS EX SOLR
CUTANEOUS | Status: AC
  Filled 2018-10-20: qty 20000

## 2018-10-20 MED ORDER — VANCOMYCIN HCL 10 G IV SOLR
1250.0000 mg | INTRAVENOUS | Status: DC
  Administered 2018-10-21 – 2018-10-22 (×2): 1250 mg via INTRAVENOUS
  Filled 2018-10-20 (×3): qty 1250

## 2018-10-20 MED ORDER — SODIUM CHLORIDE 0.9% FLUSH: 3.0000 mL | Freq: Two times a day (BID) | INTRAVENOUS | Status: DC

## 2018-10-20 MED ORDER — FENTANYL CITRATE (PF) 250 MCG/5ML IJ SOLN
INTRAMUSCULAR | Status: AC
  Filled 2018-10-20: qty 5

## 2018-10-20 MED ORDER — LORAZEPAM 2 MG/ML IJ SOLN
1.0000 mg | Freq: Once | INTRAMUSCULAR | Status: AC
  Administered 2018-10-20: 17:00:00 1 mg via INTRAVENOUS
  Filled 2018-10-20: qty 1

## 2018-10-20 MED ORDER — SODIUM CHLORIDE 0.9 % IV BOLUS
1000.0000 mL | Freq: Once | INTRAVENOUS | Status: AC
  Administered 2018-10-20: 14:00:00 1000 mL via INTRAVENOUS

## 2018-10-20 MED ORDER — KETOROLAC TROMETHAMINE 30 MG/ML IJ SOLN
15.0000 mg | Freq: Once | INTRAMUSCULAR | Status: AC
  Administered 2018-10-20: 15 mg via INTRAVENOUS
  Filled 2018-10-20: qty 1

## 2018-10-20 MED ORDER — VANCOMYCIN HCL 10 G IV SOLR
1750.0000 mg | Freq: Once | INTRAVENOUS | Status: AC
  Administered 2018-10-20: 20:00:00 1750 mg via INTRAVENOUS
  Filled 2018-10-20: qty 1750

## 2018-10-20 MED ORDER — HEPARIN SODIUM (PORCINE) 5000 UNIT/ML IJ SOLN: 5000.0000 [IU] | Freq: Three times a day (TID) | INTRAMUSCULAR | Status: DC

## 2018-10-20 MED ORDER — SODIUM CHLORIDE 0.9 % IV BOLUS
1000.0000 mL | Freq: Once | INTRAVENOUS | Status: AC
  Administered 2018-10-20: 1000 mL via INTRAVENOUS

## 2018-10-20 MED ORDER — PROPOFOL 10 MG/ML IV BOLUS
INTRAVENOUS | Status: AC
  Filled 2018-10-20: qty 40

## 2018-10-20 MED ORDER — BUPIVACAINE HCL (PF) 0.25 % IJ SOLN
INTRAMUSCULAR | Status: AC
  Filled 2018-10-20: qty 30

## 2018-10-20 MED ORDER — LIDOCAINE 2% (20 MG/ML) 5 ML SYRINGE
INTRAMUSCULAR | Status: DC | PRN
  Administered 2018-10-20: 60 mg via INTRAVENOUS

## 2018-10-20 MED ORDER — SODIUM CHLORIDE 0.9 % IV SOLN
2.0000 g | Freq: Once | INTRAVENOUS | Status: AC
  Administered 2018-10-20: 2 g via INTRAVENOUS

## 2018-10-20 MED ORDER — SODIUM CHLORIDE 0.9 % IV SOLN
2.0000 g | Freq: Three times a day (TID) | INTRAVENOUS | Status: DC
  Administered 2018-10-21 – 2018-10-22 (×4): 2 g via INTRAVENOUS
  Filled 2018-10-20 (×10): qty 2

## 2018-10-20 MED ORDER — FENTANYL CITRATE (PF) 100 MCG/2ML IJ SOLN: 100.0000 ug | Freq: Once | INTRAMUSCULAR | Status: DC

## 2018-10-20 SURGICAL SUPPLY — 66 items
BAG DECANTER FOR FLEXI CONT (MISCELLANEOUS) ×3 IMPLANT
BENZOIN TINCTURE PRP APPL 2/3 (GAUZE/BANDAGES/DRESSINGS) ×3 IMPLANT
BLADE CLIPPER SURG (BLADE) IMPLANT
BUR MATCHSTICK NEURO 3.0 LAGG (BURR) IMPLANT
CANISTER SUCT 3000ML PPV (MISCELLANEOUS) ×3 IMPLANT
CARTRIDGE OIL MAESTRO DRILL (MISCELLANEOUS) ×1 IMPLANT
CLOSURE STERI-STRIP 1/2X4 (GAUZE/BANDAGES/DRESSINGS) ×1
CLOSURE WOUND 1/2 X4 (GAUZE/BANDAGES/DRESSINGS) ×1
CLSR STERI-STRIP ANTIMIC 1/2X4 (GAUZE/BANDAGES/DRESSINGS) ×2 IMPLANT
COVER WAND RF STERILE (DRAPES) ×3 IMPLANT
DERMABOND ADVANCED (GAUZE/BANDAGES/DRESSINGS) ×2
DERMABOND ADVANCED .7 DNX12 (GAUZE/BANDAGES/DRESSINGS) ×1 IMPLANT
DIFFUSER DRILL AIR PNEUMATIC (MISCELLANEOUS) ×3 IMPLANT
DRAPE C-ARM 42X72 X-RAY (DRAPES) IMPLANT
DRAPE LAPAROTOMY 100X72 PEDS (DRAPES) IMPLANT
DRAPE LAPAROTOMY 100X72X124 (DRAPES) ×3 IMPLANT
DRAPE MICROSCOPE LEICA (MISCELLANEOUS) ×3 IMPLANT
DRAPE POUCH INSTRU U-SHP 10X18 (DRAPES) ×3 IMPLANT
DRAPE SURG 17X23 STRL (DRAPES) ×6 IMPLANT
DRSG OPSITE 4X5.5 SM (GAUZE/BANDAGES/DRESSINGS) ×3 IMPLANT
DRSG OPSITE POSTOP 4X6 (GAUZE/BANDAGES/DRESSINGS) ×3 IMPLANT
ELECT REM PT RETURN 9FT ADLT (ELECTROSURGICAL) ×3
ELECTRODE REM PT RTRN 9FT ADLT (ELECTROSURGICAL) ×1 IMPLANT
EVACUATOR 1/8 PVC DRAIN (DRAIN) ×3 IMPLANT
GAUZE 4X4 16PLY RFD (DISPOSABLE) IMPLANT
GAUZE SPONGE 4X4 12PLY STRL (GAUZE/BANDAGES/DRESSINGS) ×3 IMPLANT
GLOVE BIO SURGEON STRL SZ7 (GLOVE) IMPLANT
GLOVE BIO SURGEON STRL SZ8 (GLOVE) ×3 IMPLANT
GLOVE BIOGEL M 6.5 STRL (GLOVE) ×3 IMPLANT
GLOVE BIOGEL M 7.0 STRL (GLOVE) ×3 IMPLANT
GLOVE BIOGEL PI IND STRL 7.0 (GLOVE) IMPLANT
GLOVE BIOGEL PI INDICATOR 7.0 (GLOVE)
GOWN STRL REUS W/ TWL LRG LVL3 (GOWN DISPOSABLE) ×2 IMPLANT
GOWN STRL REUS W/ TWL XL LVL3 (GOWN DISPOSABLE) ×1 IMPLANT
GOWN STRL REUS W/TWL 2XL LVL3 (GOWN DISPOSABLE) IMPLANT
GOWN STRL REUS W/TWL LRG LVL3 (GOWN DISPOSABLE) ×4
GOWN STRL REUS W/TWL XL LVL3 (GOWN DISPOSABLE) ×2
HEMOSTAT POWDER KIT SURGIFOAM (HEMOSTASIS) IMPLANT
HEMOSTAT SURGICEL 2X14 (HEMOSTASIS) IMPLANT
KIT BASIN OR (CUSTOM PROCEDURE TRAY) ×3 IMPLANT
KIT TURNOVER KIT B (KITS) ×3 IMPLANT
NEEDLE HYPO 22GX1.5 SAFETY (NEEDLE) ×3 IMPLANT
NEEDLE SPNL 20GX3.5 QUINCKE YW (NEEDLE) IMPLANT
NS IRRIG 1000ML POUR BTL (IV SOLUTION) ×3 IMPLANT
OIL CARTRIDGE MAESTRO DRILL (MISCELLANEOUS) ×3
PACK LAMINECTOMY NEURO (CUSTOM PROCEDURE TRAY) ×3 IMPLANT
PATTIES SURGICAL .5 X3 (DISPOSABLE) ×3 IMPLANT
RUBBERBAND STERILE (MISCELLANEOUS) ×6 IMPLANT
SPONGE LAP 4X18 RFD (DISPOSABLE) IMPLANT
SPONGE SURGIFOAM ABS GEL 100 (HEMOSTASIS) IMPLANT
STAPLER VISISTAT 35W (STAPLE) IMPLANT
STRIP CLOSURE SKIN 1/2X4 (GAUZE/BANDAGES/DRESSINGS) ×2 IMPLANT
SUT BONE WAX W31G (SUTURE) IMPLANT
SUT ETHILON 4 0 PS 2 18 (SUTURE) ×3 IMPLANT
SUT NURALON 4 0 TR CR/8 (SUTURE) IMPLANT
SUT PROLENE 6 0 BV (SUTURE) IMPLANT
SUT SILK 2 0 TIES 10X30 (SUTURE) ×3 IMPLANT
SUT VIC AB 0 CT1 18XCR BRD8 (SUTURE) IMPLANT
SUT VIC AB 0 CT1 8-18 (SUTURE)
SUT VIC AB 2-0 CP2 18 (SUTURE) ×3 IMPLANT
SUT VIC AB 3-0 SH 8-18 (SUTURE) IMPLANT
SUT VICRYL 4-0 PS2 18IN ABS (SUTURE) IMPLANT
TOWEL GREEN STERILE (TOWEL DISPOSABLE) ×3 IMPLANT
TOWEL GREEN STERILE FF (TOWEL DISPOSABLE) ×3 IMPLANT
TRAY FOLEY MTR SLVR 16FR STAT (SET/KITS/TRAYS/PACK) IMPLANT
WATER STERILE IRR 1000ML POUR (IV SOLUTION) ×3 IMPLANT

## 2018-10-20 NOTE — ED Notes (Signed)
Pt returned from MRI °

## 2018-10-20 NOTE — ED Notes (Signed)
Pt remains in MRI 

## 2018-10-20 NOTE — Anesthesia Preprocedure Evaluation (Addendum)
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,   Endo/Other    Renal/GU      Musculoskeletal  (+) Arthritis , narcotic dependent  Abdominal   Peds  Hematology   Anesthesia Other Findings   Reproductive/Obstetrics                             Anesthesia Physical Anesthesia Plan  ASA: II  Anesthesia Plan: General   Post-op Pain Management:    Induction: Intravenous  PONV Risk Score and Plan: 2 and Dexamethasone, Treatment may vary due to age or medical condition and Midazolam  Airway Management Planned: Oral ETT  Additional Equipment:   Intra-op Plan:   Post-operative Plan: Extubation in OR  Informed Consent: I have reviewed the patients History and Physical, chart, labs and discussed the procedure including the risks, benefits and alternatives for the proposed anesthesia with the patient or authorized representative who has indicated his/her understanding and acceptance.       Plan Discussed with: CRNA, Anesthesiologist and Surgeon  Anesthesia Plan Comments:        Anesthesia Quick Evaluation

## 2018-10-20 NOTE — ED Notes (Signed)
Bladder scanner completed result: >932mL

## 2018-10-20 NOTE — ED Notes (Signed)
IV team at bedside 

## 2018-10-20 NOTE — ED Notes (Signed)
Called to MRI to give pt meds

## 2018-10-20 NOTE — ED Notes (Signed)
Patient transported to CT 

## 2018-10-20 NOTE — Anesthesia Procedure Notes (Signed)
Procedure Name: Intubation Date/Time: 10/20/2018 11:11 PM Performed by: Jearld Pies, CRNA Pre-anesthesia Checklist: Patient identified, Emergency Drugs available, Suction available and Patient being monitored Patient Re-evaluated:Patient Re-evaluated prior to induction Oxygen Delivery Method: Circle System Utilized Preoxygenation: Pre-oxygenation with 100% oxygen Induction Type: IV induction and Rapid sequence Laryngoscope Size: Mac and 3 Grade View: Grade I Tube type: Oral Tube size: 7.0 mm Number of attempts: 1 Airway Equipment and Method: Stylet Placement Confirmation: ETT inserted through vocal cords under direct vision,  positive ETCO2 and breath sounds checked- equal and bilateral Secured at: 22 cm Tube secured with: Tape Dental Injury: Teeth and Oropharynx as per pre-operative assessment

## 2018-10-20 NOTE — ED Triage Notes (Addendum)
To ED for eval of left side chest wall/rib pain since falling from bed 5 days ago. States 2 days ago her legs started to get numb. States she hasn't been able to tell if she has to urinate. States she just feels numbness and pressure in peri-area so she goes to toilet when she feels the pressure. Pt states she has been medicating herself with heroin at home - this is why she didn't want to come to the hospital. Last used 6 hrs ago. Pt denies ever having back pain.

## 2018-10-20 NOTE — ED Provider Notes (Signed)
[facility] EMERGENCY DEPARTMENT Provider Note   CSN: [id number] Arrival date & time: 10/20/18  [id number]     History   Chief Complaint Chief Complaint  Patient presents with   Chest Pain   leg numbness   Fall    HPI [name] [name] is a 38 y.o. female.     HPI Patient fell out of bed around 5 days ago.  States she had her left knee go up and hit her in the left lower chest.  Has pain in the left chest since then.  States the pain is getting severe.  States however she began to have numbness and then weakness from this area on the left chest that started to go down both her legs.  Now states she has pretty much this numbness below this.  States she does not hurt that much below it but just feels numb.  States she been having more trouble walking.  States she is been having difficulty urinating.  States she just feels that if she has to but then only a little come out.  No fevers.  No specific mid back pain.  She states she is been using heroin injected to help with the pain.  Last use around 6 hours ago.  No fevers or chills.  Did not hit her head.  No neck pain. 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   Opioid use disorder, severe, dependence ([place]) 09/22/2014   Alcohol use disorder 01/04/2012    Past Surgical History:  Procedure Laterality Date   ABDOMINAL HYSTERECTOMY     APPENDECTOMY     LAPAROSCOPIC ASSISTED VAGINAL HYSTERECTOMY  08/19/2011   Procedure: LAPAROSCOPIC ASSISTED VAGINAL  HYSTERECTOMY;  Surgeon: Gus Height, MD;  Location: [facility];  Service: Gynecology;  Laterality: N/A;   TONSILLECTOMY     TUBAL LIGATION       OB History    Gravida  6   Para  2   Term      Preterm      AB      Living        SAB      TAB      Ectopic      Multiple      Live Births               Home Medications    Prior to Admission medications   Medication Sig Start Date End Date Taking? Authorizing Provider  acetaminophen (TYLENOL) 500 MG tablet Take 1,000 mg by mouth every 6 (six) hours as needed for mild pain.   Yes [provider]  buprenorphine-naloxone (SUBOXONE) 8-2 mg SUBL SL tablet Place 1 tablet under the tongue 3 (three) times daily. Patient not taking: Reported on 10/20/2018 05/27/18   Sid Falcon, MD  buPROPion [facility] SR) 150 MG 12 hr tablet Take 1 tablet (150 mg total) by mouth daily for 3 days, THEN 1 tablet (150 mg total) 2 (two) times daily for  27 days. Patient not taking: Reported on 04/10/2018 03/18/18 04/17/18  Sid Falcon, MD  cyclobenzaprine (FLEXERIL) 5 MG tablet Take 1 tablet (5 mg total) by mouth daily as needed for muscle spasms. Patient not taking: Reported on 04/10/2018 10/07/17   Sid Falcon, MD  escitalopram (LEXAPRO) 20 MG tablet Take 1.5 tablets (30 mg total) by mouth daily. Patient not taking: Reported on 10/20/2018 04/06/18 07/05/18  Axel Filler, MD  gabapentin (NEURONTIN) 300 MG capsule TAKE 1 CAPSULE BY MOUTH 3 TIMES DAILY. Patient not taking: Reported on 10/20/2018 06/10/18   Aldine Contes, MD  LORazepam (ATIVAN) 1 MG tablet TAKE 1 TABLET (1 MG TOTAL) BY MOUTH 2 TIMES DAILY. Patient not taking: Reported on 10/20/2018 05/27/18   Sid Falcon, MD    Family History Family History  Problem Relation Age of Onset   Coronary artery disease Father    Coronary artery disease Maternal Grandmother    Breast cancer Mother    Cancer Mother     Social History Social History   Tobacco Use    Smoking status: Current Every Day Smoker    Packs/day: 1.00    Years: 14.00    Pack years: 14.00    Types: Cigarettes   Smokeless tobacco: Never Used   Tobacco comment: Smoking 1 PPD  Substance Use Topics   Alcohol use: No   Drug use: Yes    Types: Heroin, IV    Comment: heroin     Allergies   Epidural tray 17gx3-1-2" [nerve block tray], Ibuprofen, Zofran [ondansetron hcl], and Penicillins   Review of Systems Review of Systems  Constitutional: Positive for appetite change. Negative for fever.  HENT: Negative for congestion.   Respiratory: Negative for shortness of breath.   Cardiovascular: Positive for chest pain.  Gastrointestinal: Negative for abdominal pain.  Genitourinary: Positive for difficulty urinating.  Musculoskeletal: Negative for back pain.  Skin: Negative for wound.  Neurological: Positive for weakness and numbness.  Psychiatric/Behavioral: Negative for confusion.     Physical Exam Updated Vital Signs BP 120/67    Pulse (!) 117    Temp 98.6 F (37 C) (Oral)    Resp (!) 22    Ht 5\' 2"  (1.575 m)    Wt 81.6 kg    LMP 08/10/2011    SpO2 98%    BMI 32.92 kg/m   Physical Exam Vitals signs reviewed.  Constitutional:      Appearance: She is well-developed.  Eyes:     Extraocular Movements: Extraocular movements intact.     Comments: Mass on left lower eyelid.  Cardiovascular:     Rate and Rhythm: Tachycardia present.  Pulmonary:     Breath sounds: No wheezing, rhonchi or rales.  Chest:     Comments: Tenderness to left lateral lower chest wall. Abdominal:     Tenderness: There is abdominal tenderness.     Comments: Tender to lower abdomen with fullness/mass.  Tenderness to left upper quadrant also.  No ecchymosis.  Musculoskeletal:     Right lower leg: She exhibits no tenderness.     Left lower leg: She exhibits no tenderness.  Skin:    Comments: Few areas of skin breakdown/lesions on bilateral forearms.  Neurological:     Comments: Patient is  awake and appropriate.  From lower thoracic level there is no spine tenderness however there is paresthesias from this level down.  Difficulty raising either leg.  Patellar reflexes are brisk.  Paresthesias over both lower extremities.  Able to feel  me touch of states it feels tingly like when your legs are asleep.      ED Treatments / Results  Labs (all labs ordered are listed, but only abnormal results are displayed) Labs Reviewed  CBC WITH DIFFERENTIAL/PLATELET - Abnormal; Notable for the following components:      Result Value   WBC 20.3 (*)    Platelets 580 (*)    Neutro Abs 16.0 (*)    Abs Immature Granulocytes 0.14 (*)    All other components within normal limits  COMPREHENSIVE METABOLIC PANEL - Abnormal; Notable for the following components:   Sodium 130 (*)    Chloride 94 (*)    Glucose, Bld 119 (*)    Total Protein 8.3 (*)    Albumin 2.6 (*)    Alkaline Phosphatase 338 (*)    All other components within normal limits  URINALYSIS, ROUTINE W REFLEX MICROSCOPIC - Abnormal; Notable for the following components:   Protein, ur 30 (*)    All other components within normal limits  RAPID URINE DRUG SCREEN, HOSP PERFORMED - Abnormal; Notable for the following components:   Opiates POSITIVE (*)    Amphetamines POSITIVE (*)    All other components within normal limits  CULTURE, BLOOD (ROUTINE X 2)  CULTURE, BLOOD (ROUTINE X 2)  SARS CORONAVIRUS 2 (HOSPITAL ORDER, [facility])  LACTIC ACID, PLASMA  LACTIC ACID, PLASMA  I-STAT BETA HCG BLOOD, ED ([facility], [facility], [facility] ONLY)    EKG EKG Interpretation  Date/Time:  Wednesday October 20 2018 07:50:15 EDT Ventricular Rate:  113 PR Interval:    QRS Duration: 93 QT Interval:  319 QTC Calculation: 438 R Axis:   5 Text Interpretation:  Sinus tachycardia Ventricular premature complex Aberrant complex RSR' in V1 or V2, probably normal variant Confirmed by Davonna Belling [phone]) on 10/20/2018 9:36:06  AM   Radiology Ct Abdomen Pelvis Wo Contrast  Result Date: 10/20/2018 CLINICAL DATA:  Rib fracture suspected status post trauma. EXAM: CT CHEST, ABDOMEN AND PELVIS WITHOUT CONTRAST TECHNIQUE: Multidetector CT imaging of the chest, abdomen and pelvis was performed following the standard protocol without IV contrast. Contrast extravasation occurred during this examination. Approximately 60-80 cc of contrast was extravasated into the left upper extremity. COMPARISON:  April 10, 2018 FINDINGS: CT CHEST FINDINGS Cardiovascular: The heart size is normal. There is no significant pericardial effusion. The aorta is not significantly dilated. Mediastinum/Nodes: --No mediastinal or hilar lymphadenopathy. --No axillary lymphadenopathy. --No supraclavicular lymphadenopathy. --Normal thyroid gland. --The esophagus is unremarkable Lungs/Pleura: There is a moderate likely complex loculated left-sided pleural effusion with compressive atelectasis involving the left lower lobe. There is a trace right-sided pleural effusion with adjacent compressive atelectasis. There is no pneumothorax. There is some debris within the trachea. Musculoskeletal: There is subcutaneous gas involving the patient's upper breast with surrounding fat stranding (axial series 3, image 15). There is a similar area of fat stranding and subcutaneous gas in the left breast (axial series 3, image 19). CT ABDOMEN PELVIS FINDINGS Hepatobiliary: The liver is normal. Normal gallbladder.There is no biliary ductal dilation. Pancreas: Normal contours without ductal dilatation. No peripancreatic fluid collection. Spleen: No splenic laceration or hematoma. Adrenals/Urinary Tract: --Adrenal glands: No adrenal hemorrhage. --Right kidney/ureter: No hydronephrosis or perinephric hematoma. --Left kidney/ureter: No hydronephrosis or perinephric hematoma. --Urinary bladder: The bladder is decompressed with a Foley catheter and therefore is poorly evaluated. Stomach/Bowel:  --Stomach/Duodenum: No hiatal hernia or other gastric abnormality. Normal duodenal course and caliber. --Small bowel: No dilatation or inflammation. --  Colon: There is a moderate amount of stool in the colon. --Appendix: Surgically absent. Vascular/Lymphatic: Normal course and caliber of the major abdominal vessels. --there is nonspecific mild retroperitoneal adenopathy. --No mesenteric lymphadenopathy. --No pelvic or inguinal lymphadenopathy. Reproductive: Status post hysterectomy. No adnexal mass. Other: No ascites or free air. The abdominal wall is normal. Musculoskeletal. No acute displaced fractures. IMPRESSION: 1. Contrast extravasation to the patient's left upper extremity. The patient was examined following this scan. There was no evidence for neurovascular compromise. These findings were relayed to the ordering physician by the CT technologist. Careful follow-up is recommended. 2. Moderate-sized complex appearing, likely loculated left-sided pleural effusion. 3. Foci of subcutaneous gas with surrounding fat stranding involving both breasts. These may represent sites of subcutaneous injection. A developing infectious process is not excluded. Clinical correlation is recommended. 4. No acute intra-abdominal or pelvic injury. 5. No displaced rib fracture. Electronically Signed   By: Constance Holster M.D.   On: 10/20/2018 13:23   Dg Chest 2 View  Result Date: 10/20/2018 CLINICAL DATA:  Chest pain after fall. EXAM: CHEST - 2 VIEW COMPARISON:  Radiograph of November 16, 2016. FINDINGS: The heart size and mediastinal contours are within normal limits. No pneumothorax is noted. Right lung is clear. Mild left basilar atelectasis or infiltrate is noted with associated pleural effusion. The visualized skeletal structures are unremarkable. IMPRESSION: Mild left basilar atelectasis or infiltrate is noted with associated pleural effusion. Electronically Signed   By: Marijo Conception M.D.   On: 10/20/2018 07:31   Ct  Chest Wo Contrast  Result Date: 10/20/2018 CLINICAL DATA:  Rib fracture suspected status post trauma. EXAM: CT CHEST, ABDOMEN AND PELVIS WITHOUT CONTRAST TECHNIQUE: Multidetector CT imaging of the chest, abdomen and pelvis was performed following the standard protocol without IV contrast. Contrast extravasation occurred during this examination. Approximately 60-80 cc of contrast was extravasated into the left upper extremity. COMPARISON:  April 10, 2018 FINDINGS: CT CHEST FINDINGS Cardiovascular: The heart size is normal. There is no significant pericardial effusion. The aorta is not significantly dilated. Mediastinum/Nodes: --No mediastinal or hilar lymphadenopathy. --No axillary lymphadenopathy. --No supraclavicular lymphadenopathy. --Normal thyroid gland. --The esophagus is unremarkable Lungs/Pleura: There is a moderate likely complex loculated left-sided pleural effusion with compressive atelectasis involving the left lower lobe. There is a trace right-sided pleural effusion with adjacent compressive atelectasis. There is no pneumothorax. There is some debris within the trachea. Musculoskeletal: There is subcutaneous gas involving the patient's upper breast with surrounding fat stranding (axial series 3, image 15). There is a similar area of fat stranding and subcutaneous gas in the left breast (axial series 3, image 19). CT ABDOMEN PELVIS FINDINGS Hepatobiliary: The liver is normal. Normal gallbladder.There is no biliary ductal dilation. Pancreas: Normal contours without ductal dilatation. No peripancreatic fluid collection. Spleen: No splenic laceration or hematoma. Adrenals/Urinary Tract: --Adrenal glands: No adrenal hemorrhage. --Right kidney/ureter: No hydronephrosis or perinephric hematoma. --Left kidney/ureter: No hydronephrosis or perinephric hematoma. --Urinary bladder: The bladder is decompressed with a Foley catheter and therefore is poorly evaluated. Stomach/Bowel: --Stomach/Duodenum: No hiatal  hernia or other gastric abnormality. Normal duodenal course and caliber. --Small bowel: No dilatation or inflammation. --Colon: There is a moderate amount of stool in the colon. --Appendix: Surgically absent. Vascular/Lymphatic: Normal course and caliber of the major abdominal vessels. --there is nonspecific mild retroperitoneal adenopathy. --No mesenteric lymphadenopathy. --No pelvic or inguinal lymphadenopathy. Reproductive: Status post hysterectomy. No adnexal mass. Other: No ascites or free air. The abdominal wall is normal. Musculoskeletal. No acute displaced  fractures. IMPRESSION: 1. Contrast extravasation to the patient's left upper extremity. The patient was examined following this scan. There was no evidence for neurovascular compromise. These findings were relayed to the ordering physician by the CT technologist. Careful follow-up is recommended. 2. Moderate-sized complex appearing, likely loculated left-sided pleural effusion. 3. Foci of subcutaneous gas with surrounding fat stranding involving both breasts. These may represent sites of subcutaneous injection. A developing infectious process is not excluded. Clinical correlation is recommended. 4. No acute intra-abdominal or pelvic injury. 5. No displaced rib fracture. Electronically Signed   By: Constance Holster M.D.   On: 10/20/2018 13:23   Ct T-spine No Charge  Result Date: 10/20/2018 CLINICAL DATA:  [place] 5 days ago. Numbness in lower extremities and right rib pain. EXAM: CT THORACIC AND LUMBAR SPINE WITHOUT CONTRAST TECHNIQUE: Multidetector CT imaging of the thoracic and lumbar spine was performed without contrast. Multiplanar CT image reconstructions were also generated. COMPARISON:  CT scan from 2016 FINDINGS: CT THORACIC SPINE FINDINGS Alignment: Scoliosis but normal alignment in the sagittal plane. Vertebrae: No acute bony findings.  Mild degenerative changes. Paraspinal and other soft tissues: No significant paraspinal findings. A  moderate-sized left pleural effusion is noted with overlying atelectasis. No obvious posterior rib fractures. Disc levels: The spinal canal is generous. No obvious thoracic disc protrusions or canal compromise. No pedicle, posterior element or facet fractures. CT LUMBAR SPINE FINDINGS Segmentation: There are five lumbar type vertebral bodies. The last full intervertebral disc space is labeled L5-S1. Alignment: Normal Vertebrae: No lumbar fracture is identified. The facets are normally aligned. No facet or laminar fracture. No pars defects. Paraspinal and other soft tissues: No significant paraspinal or retroperitoneal findings. Disc levels: There is a broad-based left paracentral disc protrusion at L5-S1 which appears to contact the left S1 nerve root. The other intervertebral discs are unremarkable. IMPRESSION: CT THORACIC SPINE IMPRESSION Scoliosis and mild degenerative changes but no acute fracture or spinal canal compromise. CT LUMBAR SPINE IMPRESSION Normal alignment and no acute bony findings. Shallow broad-based left paracentral disc protrusion noted at L5-S1. Electronically Signed   By: Marijo Sanes M.D.   On: 10/20/2018 13:08   Ct L-spine No Charge  Result Date: 10/20/2018 CLINICAL DATA:  [place] 5 days ago. Numbness in lower extremities and right rib pain. EXAM: CT THORACIC AND LUMBAR SPINE WITHOUT CONTRAST TECHNIQUE: Multidetector CT imaging of the thoracic and lumbar spine was performed without contrast. Multiplanar CT image reconstructions were also generated. COMPARISON:  CT scan from 2016 FINDINGS: CT THORACIC SPINE FINDINGS Alignment: Scoliosis but normal alignment in the sagittal plane. Vertebrae: No acute bony findings.  Mild degenerative changes. Paraspinal and other soft tissues: No significant paraspinal findings. A moderate-sized left pleural effusion is noted with overlying atelectasis. No obvious posterior rib fractures. Disc levels: The spinal canal is generous. No obvious thoracic disc  protrusions or canal compromise. No pedicle, posterior element or facet fractures. CT LUMBAR SPINE FINDINGS Segmentation: There are five lumbar type vertebral bodies. The last full intervertebral disc space is labeled L5-S1. Alignment: Normal Vertebrae: No lumbar fracture is identified. The facets are normally aligned. No facet or laminar fracture. No pars defects. Paraspinal and other soft tissues: No significant paraspinal or retroperitoneal findings. Disc levels: There is a broad-based left paracentral disc protrusion at L5-S1 which appears to contact the left S1 nerve root. The other intervertebral discs are unremarkable. IMPRESSION: CT THORACIC SPINE IMPRESSION Scoliosis and mild degenerative changes but no acute fracture or spinal canal compromise. CT LUMBAR SPINE IMPRESSION  Normal alignment and no acute bony findings. Shallow broad-based left paracentral disc protrusion noted at L5-S1. Electronically Signed   By: Marijo Sanes M.D.   On: 10/20/2018 13:08    Procedures Procedures (including critical care time)  Medications Ordered in ED Medications  cefTRIAXone (ROCEPHIN) 2 g in sodium chloride 0.9 % 100 mL IVPB (has no administration in time range)  sodium chloride 0.9 % bolus 1,000 mL (has no administration in time range)  vancomycin (VANCOCIN) 1,750 mg in sodium chloride 0.9 % 500 mL IVPB (has no administration in time range)  sodium chloride 0.9 % bolus 1,000 mL (0 mLs Intravenous Stopped 10/20/18 1127)  iohexol (OMNIPAQUE) 300 MG/ML solution 100 mL (100 mLs Intravenous Contrast Given 10/20/18 1127)  ketorolac (TORADOL) 30 MG/ML injection 15 mg (15 mg Intravenous Given 10/20/18 1322)     Initial Impression / Assessment and Plan / ED Course  I have reviewed the triage vital signs and the nursing notes.  Pertinent labs & imaging results that were available during my care of the patient were reviewed by me and considered in my medical decision making (see chart for details).         Patient presents with a fall a few days ago.  Left chest pain.  Has loculated pleural effusion on the left side.  Has a white count of 20.  Difficult IV access.  However has numbness from about mid chest going down with some weakness and urinary retention.  This coupled with the IV drug use is worrisome for a spinal process such as epidural abscess.  Although she states she does not have too much back pain.  I think with the loculated effusion and the white count we need to treat for infection.  Also MRI is been ordered for the weakness.  However patient require admission the hospital.  Depending on MRI results neurology versus neurosurgery could be consulted for the weakness and numbness.  Blood cultures been sent.  Reassuring lactic acid.  Final Clinical Impressions(s) / ED Diagnoses   Final diagnoses:  Pleural effusion  IVDU (intravenous drug user)  Weakness  Urinary retention    ED Discharge Orders    None       Davonna Belling, MD 10/20/18 1343

## 2018-10-20 NOTE — ED Notes (Signed)
Pt remains in MRI at this time  

## 2018-10-20 NOTE — ED Notes (Signed)
Pt to MRI at this time.
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
## 2018-10-20 NOTE — Consult Note (Signed)
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 of the normal thoracic kyphosis. No listhesis. Vertebrae: Vertebral body height maintained without evidence for acute or chronic fracture. Visualized bone marrow signal intensity diffusely decreased on T1 weighted imaging. No discrete or  worrisome osseous lesions. Reactive marrow edema seen about the left T8-9 facet with associated small joint effusion (series 17, image 8). Finding concerning for possible septic arthritis. Possible involvement of the adjacent left T8 and T9 costovertebral articulations. No other abnormal marrow edema or definite evidence for osteomyelitis. Cord: Epidural collection involving the dorsal epidural space extending from T7 through T10 is seen, highly suspicious for epidural abscess. This measures approximately 0.7 x 2.2 x 6.8 cm in maximal dimensions (AP by transverse by craniocaudad) (series 21, image 5). Abnormal epidural enhancement involves the ventral epidural space at these levels as well without frank abscess. Resultant up to severe spinal stenosis, most pronounced at the level of T8-9. Thecal sac measures 5 mm in AP diameter at its most narrow point (series 18, image 19). No definite cord signal changes. Remainder of the thoracic spinal cord and spinal canal  within normal limits. Paraspinal soft tissues: Mild localized edema and enhancement centered about the left T8-9 facet. No discrete soft tissue abscess or collection. Irregular complex multiloculated left pleural effusion, partially visualize, but concerning for possible concomitant empyema. Associated irregular pleural enhancement. Additional small layering right pleural effusion with associated atelectasis. Visualized visceral structures otherwise unremarkable. No overt evidence for discitis. Disc levels: T1-2:  Unremarkable. T2-3: Unremarkable. T3-4:  Unremarkable. T4-5:  Unremarkable. T5-6: Tiny left paracentral disc protrusion mildly flattens the left ventral thecal sac. No significant stenosis. T6-7: Tiny left paracentral disc protrusion mildly flattens the left ventral thecal sac. No significant stenosis. T7-8: Small central disc protrusion mildly indents the ventral thecal sac. Superior aspect of the dorsal epidural abscess. No significant stenosis. T8-9: Small central disc protrusion mildly indents the ventral thecal sac. Superimposed epidural abscess. Resultant fairly severe spinal stenosis. Foramina remain patent. T9-10: Mild disc bulge with superimposed small central disc protrusion. Epidural abscess. Resultant fairly severe spinal stenosis. Foramina remain patent. T10-11:  Unremarkable. T11-12:  Unremarkable. T12-L1:  Unremarkable. MRI LUMBAR SPINE FINDINGS Segmentation: Examination moderately degraded by motion artifact. Standard segmentation. Lowest well-formed disc labeled the L5-S1 level. Alignment: Physiologic with preservation of the normal lumbar lordosis. No listhesis. Vertebrae: Vertebral body height maintained without evidence for acute or chronic fracture. Diffusely decreased T1 weighted signal seen throughout the visualized bone marrow. No discrete or worrisome osseous lesions. No evidence for osteomyelitis discitis. Conus: Conus medullaris terminates at the L1 level and is normal in  appearance. Nerve roots of the cauda equina grossly within normal limits. Paraspinal soft tissues: Paraspinous soft tissues demonstrate no acute finding. Visualized visceral structures grossly unremarkable. Disc levels: L1-2:  Unremarkable. L2-3:  Unremarkable. L3-4:  Unremarkable. L4-5: Negative interspace. Moderate bilateral facet hypertrophy. No stenosis or impingement. L5-S1: Broad-based left subarticular disc protrusion with associated annular fissure. Protruding disc impinges upon the descending left S1 nerve root in the left lateral recess. Mild facet hypertrophy. Resultant moderate left lateral recess stenosis. Central canal remains widely patent. No foraminal stenosis. IMPRESSION: MRI HEAD IMPRESSION: 1. No acute intracranial abnormality identified. 2. Small remote left thalamic lacunar infarct. 3. Otherwise unremarkable and normal brain MRI. MRI CERVICAL SPINE IMPRESSION: 1. No acute abnormality within the cervical spine. No evidence for acute infection. 2. Mild left eccentric disc bulge at C3-4 with resultant mild left C4 foraminal stenosis. 3. Additional minor noncompressive disc bulging at C5-6 and C6-7 without stenosis or impingement. MRI THORACIC SPINE IMPRESSION: 1. 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 No cord signal changes at this  time. 2. Reactive marrow edema and enhancement about the left T8-9 facet, concerning for septic arthritis, with possible involvement of the adjacent T8 and T9 costovertebral articulations. No overt evidence for discitis at this time. 3. Complex multiloculated left pleural effusion, concerning for possible empyema given the findings in the adjacent thoracic spine. 4. Small layering right pleural effusion with associated atelectasis. MRI LUMBAR SPINE IMPRESSION: 1. No acute abnormality within the lumbar spine. No evidence for acute infection. 2. Left subarticular disc  protrusion at L5-S1, impinging upon the descending left S1 nerve root in the left lateral recess. 3. Moderate bilateral facet hypertrophy at L4-5 without stenosis. Current attempt is being made to contact the covering clinician. Results will be communicated as soon as possible. Electronically Signed   By: Jeannine Boga M.D.   On: 10/20/2018 21:02   Mr Thoracic Spine W Wo Contrast  Result Date: 10/20/2018 CLINICAL DATA:  Initial evaluation for acute numbness, tingling, paresthesias. Recent fall. EXAM: MRI HEAD WITHOUT AND WITH CONTRAST MRI CERVICAL SPINE WITHOUT AND WITH CONTRAST TECHNIQUE: Multiplanar, multiecho pulse sequences of the brain and surrounding structures, and cervical spine, to include the craniocervical junction and cervicothoracic junction, were obtained without and with intravenous contrast. CONTRAST:  64mL GADAVIST GADOBUTROL 1 MMOL/ML IV SOLN COMPARISON:  Comparison made with prior CT of the thoracic and lumbar spine performed earlier the same day. FINDINGS: MRI HEAD FINDINGS Brain: Examination moderately degraded by motion artifact. Cerebral volume within normal limits for patient age. Approximate 1 cm chronic lacunar type infarct noted involving the left thalamus. No other focal parenchymal signal abnormality. No abnormal foci of restricted diffusion to suggest acute or subacute ischemia. Gray-white matter differentiation well maintained. No encephalomalacia to suggest chronic infarction. No foci of susceptibility artifact to suggest acute or chronic intracranial hemorrhage. No mass lesion, midline shift or mass effect. No hydrocephalus. No extra-axial fluid collection. Major dural sinuses are grossly patent. Pituitary gland and suprasellar region are normal. Midline structures intact and normal. No definite abnormal enhancement on this motion degraded exam. Vascular: Major intracranial vascular flow voids well maintained and normal in appearance. Skull and upper cervical spine:  Craniocervical junction normal. Visualized upper cervical spine within normal limits. Bone marrow signal intensity normal. No scalp soft tissue abnormality. Sinuses/Orbits: Globes and orbital soft tissues within normal limits. Paranasal sinuses are largely clear. No mastoid effusion. Inner ear structures normal. Other: None. MRI CERVICAL SPINE FINDINGS Alignment: Examination moderately degraded by motion artifact. Straightening of the normal cervical lordosis. No listhesis or malalignment. Vertebrae: Vertebral body height maintained without evidence for acute or chronic fracture. Bone marrow signal intensity diffusely decreased on T1 weighted imaging, nonspecific, but most commonly related to anemia, smoking, or obesity. No discrete or worrisome osseous lesions. No abnormal marrow edema or enhancement. Cord: Signal intensity within the cervical spinal cord is within normal limits. Normal cord caliber and morphology. No abnormal enhancement. Posterior Fossa, vertebral arteries, paraspinal tissues: Craniocervical junction within normal limits. Paraspinous and prevertebral soft tissues are normal. Normal intravascular flow voids seen within the vertebral arteries bilaterally. Disc levels: C2-C3: Unremarkable. C3-C4: Mild left eccentric disc bulge with uncovertebral hypertrophy. Flattening of the ventral thecal sac without significant spinal stenosis. Mild left C4 foraminal narrowing. No significant right foraminal stenosis. C4-C5:  Unremarkable. C5-C6:  Mild annular disc bulge.  No canal or foraminal stenosis. C6-C7: Mild disc bulge. No significant canal or foraminal stenosis. C7-T1:  Unremarkable. MRI THORACIC SPINE FINDINGS Alignment: Dextroscoliosis. Alignment otherwise normal with preservation of the normal thoracic kyphosis. No  listhesis. Vertebrae: Vertebral body height maintained without evidence for acute or chronic fracture. Visualized bone marrow signal intensity diffusely decreased on T1 weighted imaging. No  discrete or worrisome osseous lesions. Reactive marrow edema seen about the left T8-9 facet with associated small joint effusion (series 17, image 8). Finding concerning for possible septic arthritis. Possible involvement of the adjacent left T8 and T9 costovertebral articulations. No other abnormal marrow edema or definite evidence for osteomyelitis. Cord: Epidural collection involving the dorsal epidural space extending from T7 through T10 is seen, highly suspicious for epidural abscess. This measures approximately 0.7 x 2.2 x 6.8 cm in maximal dimensions (AP by transverse by craniocaudad) (series 21, image 5). Abnormal epidural enhancement involves the ventral epidural space at these levels as well without frank abscess. Resultant up to severe spinal stenosis, most pronounced at the level of T8-9. Thecal sac measures 5 mm in AP diameter at its most narrow point (series 18, image 19). No definite cord signal changes. Remainder of the thoracic spinal cord and spinal canal within normal limits. Paraspinal soft tissues: Mild localized edema and enhancement centered about the left T8-9 facet. No discrete soft tissue abscess or collection. Irregular complex multiloculated left pleural effusion, partially visualize, but concerning for possible concomitant empyema. Associated irregular pleural enhancement. Additional small layering right pleural effusion with associated atelectasis. Visualized visceral structures otherwise unremarkable. No overt evidence for discitis. Disc levels: T1-2:  Unremarkable. T2-3: Unremarkable. T3-4:  Unremarkable. T4-5:  Unremarkable. T5-6: Tiny left paracentral disc protrusion mildly flattens the left ventral thecal sac. No significant stenosis. T6-7: Tiny left paracentral disc protrusion mildly flattens the left ventral thecal sac. No significant stenosis. T7-8: Small central disc protrusion mildly indents the ventral thecal sac. Superior aspect of the dorsal epidural abscess. No significant  stenosis. T8-9: Small central disc protrusion mildly indents the ventral thecal sac. Superimposed epidural abscess. Resultant fairly severe spinal stenosis. Foramina remain patent. T9-10: Mild disc bulge with superimposed small central disc protrusion. Epidural abscess. Resultant fairly severe spinal stenosis. Foramina remain patent. T10-11:  Unremarkable. T11-12:  Unremarkable. T12-L1:  Unremarkable. MRI LUMBAR SPINE FINDINGS Segmentation: Examination moderately degraded by motion artifact. Standard segmentation. Lowest well-formed disc labeled the L5-S1 level. Alignment: Physiologic with preservation of the normal lumbar lordosis. No listhesis. Vertebrae: Vertebral body height maintained without evidence for acute or chronic fracture. Diffusely decreased T1 weighted signal seen throughout the visualized bone marrow. No discrete or worrisome osseous lesions. No evidence for osteomyelitis discitis. Conus: Conus medullaris terminates at the L1 level and is normal in appearance. Nerve roots of the cauda equina grossly within normal limits. Paraspinal soft tissues: Paraspinous soft tissues demonstrate no acute finding. Visualized visceral structures grossly unremarkable. Disc levels: L1-2:  Unremarkable. L2-3:  Unremarkable. L3-4:  Unremarkable. L4-5: Negative interspace. Moderate bilateral facet hypertrophy. No stenosis or impingement. L5-S1: Broad-based left subarticular disc protrusion with associated annular fissure. Protruding disc impinges upon the descending left S1 nerve root in the left lateral recess. Mild facet hypertrophy. Resultant moderate left lateral recess stenosis. Central canal remains widely patent. No foraminal stenosis. IMPRESSION: MRI HEAD IMPRESSION: 1. No acute intracranial abnormality identified. 2. Small remote left thalamic lacunar infarct. 3. Otherwise unremarkable and normal brain MRI. MRI CERVICAL SPINE IMPRESSION: 1. No acute abnormality within the cervical spine. No evidence for acute  infection. 2. Mild left eccentric disc bulge at C3-4 with resultant mild left C4 foraminal stenosis. 3. Additional minor noncompressive disc bulging at C5-6 and C6-7 without stenosis or impingement. MRI THORACIC SPINE IMPRESSION: 1. Approximate  0.7 x 2.2 x 6.8 cm dorsal epidural abscess extending from T7 through T10 with resultant severe spinal stenosis as detailed above. No cord signal changes at this time. 2. Reactive marrow edema and enhancement about the left T8-9 facet, concerning for septic arthritis, with possible involvement of the adjacent T8 and T9 costovertebral articulations. No overt evidence for discitis at this time. 3. Complex multiloculated left pleural effusion, concerning for possible empyema given the findings in the adjacent thoracic spine. 4. Small layering right pleural effusion with associated atelectasis. MRI LUMBAR SPINE IMPRESSION: 1. No acute abnormality within the lumbar spine. No evidence for acute infection. 2. Left subarticular disc protrusion at L5-S1, impinging upon the descending left S1 nerve root in the left lateral recess. 3. Moderate bilateral facet hypertrophy at L4-5 without stenosis. Current attempt is being made to contact the covering clinician. Results will be communicated as soon as possible. Electronically Signed   By: Jeannine Boga M.D.   On: 10/20/2018 21:02   Mr Lumbar Spine W Wo Contrast  Result Date: 10/20/2018 CLINICAL DATA:  Initial evaluation for acute numbness, tingling, paresthesias. Recent fall. EXAM: MRI HEAD WITHOUT AND WITH CONTRAST MRI CERVICAL SPINE WITHOUT AND WITH CONTRAST TECHNIQUE: Multiplanar, multiecho pulse sequences of the brain and surrounding structures, and cervical spine, to include the craniocervical junction and cervicothoracic junction, were obtained without and with intravenous contrast. CONTRAST:  40mL GADAVIST GADOBUTROL 1 MMOL/ML IV SOLN COMPARISON:  Comparison made with prior CT of the thoracic and lumbar spine performed  earlier the same day. FINDINGS: MRI HEAD FINDINGS Brain: Examination moderately degraded by motion artifact. Cerebral volume within normal limits for patient age. Approximate 1 cm chronic lacunar type infarct noted involving the left thalamus. No other focal parenchymal signal abnormality. No abnormal foci of restricted diffusion to suggest acute or subacute ischemia. Gray-white matter differentiation well maintained. No encephalomalacia to suggest chronic infarction. No foci of susceptibility artifact to suggest acute or chronic intracranial hemorrhage. No mass lesion, midline shift or mass effect. No hydrocephalus. No extra-axial fluid collection. Major dural sinuses are grossly patent. Pituitary gland and suprasellar region are normal. Midline structures intact and normal. No definite abnormal enhancement on this motion degraded exam. Vascular: Major intracranial vascular flow voids well maintained and normal in appearance. Skull and upper cervical spine: Craniocervical junction normal. Visualized upper cervical spine within normal limits. Bone marrow signal intensity normal. No scalp soft tissue abnormality. Sinuses/Orbits: Globes and orbital soft tissues within normal limits. Paranasal sinuses are largely clear. No mastoid effusion. Inner ear structures normal. Other: None. MRI CERVICAL SPINE FINDINGS Alignment: Examination moderately degraded by motion artifact. Straightening of the normal cervical lordosis. No listhesis or malalignment. Vertebrae: Vertebral body height maintained without evidence for acute or chronic fracture. Bone marrow signal intensity diffusely decreased on T1 weighted imaging, nonspecific, but most commonly related to anemia, smoking, or obesity. No discrete or worrisome osseous lesions. No abnormal marrow edema or enhancement. Cord: Signal intensity within the cervical spinal cord is within normal limits. Normal cord caliber and morphology. No abnormal enhancement. Posterior Fossa,  vertebral arteries, paraspinal tissues: Craniocervical junction within normal limits. Paraspinous and prevertebral soft tissues are normal. Normal intravascular flow voids seen within the vertebral arteries bilaterally. Disc levels: C2-C3: Unremarkable. C3-C4: Mild left eccentric disc bulge with uncovertebral hypertrophy. Flattening of the ventral thecal sac without significant spinal stenosis. Mild left C4 foraminal narrowing. No significant right foraminal stenosis. C4-C5:  Unremarkable. C5-C6:  Mild annular disc bulge.  No canal or foraminal stenosis. C6-C7: Mild  disc bulge. No significant canal or foraminal stenosis. C7-T1:  Unremarkable. MRI THORACIC SPINE FINDINGS Alignment: Dextroscoliosis. Alignment otherwise normal with preservation of the normal thoracic kyphosis. No listhesis. Vertebrae: Vertebral body height maintained without evidence for acute or chronic fracture. Visualized bone marrow signal intensity diffusely decreased on T1 weighted imaging. No discrete or worrisome osseous lesions. Reactive marrow edema seen about the left T8-9 facet with associated small joint effusion (series 17, image 8). Finding concerning for possible septic arthritis. Possible involvement of the adjacent left T8 and T9 costovertebral articulations. No other abnormal marrow edema or definite evidence for osteomyelitis. Cord: Epidural collection involving the dorsal epidural space extending from T7 through T10 is seen, highly suspicious for epidural abscess. This measures approximately 0.7 x 2.2 x 6.8 cm in maximal dimensions (AP by transverse by craniocaudad) (series 21, image 5). Abnormal epidural enhancement involves the ventral epidural space at these levels as well without frank abscess. Resultant up to severe spinal stenosis, most pronounced at the level of T8-9. Thecal sac measures 5 mm in AP diameter at its most narrow point (series 18, image 19). No definite cord signal changes. Remainder of the thoracic spinal cord  and spinal canal within normal limits. Paraspinal soft tissues: Mild localized edema and enhancement centered about the left T8-9 facet. No discrete soft tissue abscess or collection. Irregular complex multiloculated left pleural effusion, partially visualize, but concerning for possible concomitant empyema. Associated irregular pleural enhancement. Additional small layering right pleural effusion with associated atelectasis. Visualized visceral structures otherwise unremarkable. No overt evidence for discitis. Disc levels: T1-2:  Unremarkable. T2-3: Unremarkable. T3-4:  Unremarkable. T4-5:  Unremarkable. T5-6: Tiny left paracentral disc protrusion mildly flattens the left ventral thecal sac. No significant stenosis. T6-7: Tiny left paracentral disc protrusion mildly flattens the left ventral thecal sac. No significant stenosis. T7-8: Small central disc protrusion mildly indents the ventral thecal sac. Superior aspect of the dorsal epidural abscess. No significant stenosis. T8-9: Small central disc protrusion mildly indents the ventral thecal sac. Superimposed epidural abscess. Resultant fairly severe spinal stenosis. Foramina remain patent. T9-10: Mild disc bulge with superimposed small central disc protrusion. Epidural abscess. Resultant fairly severe spinal stenosis. Foramina remain patent. T10-11:  Unremarkable. T11-12:  Unremarkable. T12-L1:  Unremarkable. MRI LUMBAR SPINE FINDINGS Segmentation: Examination moderately degraded by motion artifact. Standard segmentation. Lowest well-formed disc labeled the L5-S1 level. Alignment: Physiologic with preservation of the normal lumbar lordosis. No listhesis. Vertebrae: Vertebral body height maintained without evidence for acute or chronic fracture. Diffusely decreased T1 weighted signal seen throughout the visualized bone marrow. No discrete or worrisome osseous lesions. No evidence for osteomyelitis discitis. Conus: Conus medullaris terminates at the L1 level and is  normal in appearance. Nerve roots of the cauda equina grossly within normal limits. Paraspinal soft tissues: Paraspinous soft tissues demonstrate no acute finding. Visualized visceral structures grossly unremarkable. Disc levels: L1-2:  Unremarkable. L2-3:  Unremarkable. L3-4:  Unremarkable. L4-5: Negative interspace. Moderate bilateral facet hypertrophy. No stenosis or impingement. L5-S1: Broad-based left subarticular disc protrusion with associated annular fissure. Protruding disc impinges upon the descending left S1 nerve root in the left lateral recess. Mild facet hypertrophy. Resultant moderate left lateral recess stenosis. Central canal remains widely patent. No foraminal stenosis. IMPRESSION: MRI HEAD IMPRESSION: 1. No acute intracranial abnormality identified. 2. Small remote left thalamic lacunar infarct. 3. Otherwise unremarkable and normal brain MRI. MRI CERVICAL SPINE IMPRESSION: 1. No acute abnormality within the cervical spine. No evidence for acute infection. 2. Mild left eccentric disc bulge at C3-4  with resultant mild left C4 foraminal stenosis. 3. Additional minor noncompressive disc bulging at C5-6 and C6-7 without stenosis or impingement. MRI THORACIC SPINE IMPRESSION: 1. 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 No cord signal changes at this time. 2. Reactive marrow edema and enhancement about the left T8-9 facet, concerning for septic arthritis, with possible involvement of the adjacent T8 and T9 costovertebral articulations. No overt evidence for discitis at this time. 3. Complex multiloculated left pleural effusion, concerning for possible empyema given the findings in the adjacent thoracic spine. 4. Small layering right pleural effusion with associated atelectasis. MRI LUMBAR SPINE IMPRESSION: 1. No acute abnormality within the lumbar spine. No evidence for acute infection. 2. Left  subarticular disc protrusion at L5-S1, impinging upon the descending left S1 nerve root in the left lateral recess. 3. Moderate bilateral facet hypertrophy at L4-5 without stenosis. Current attempt is being made to contact the covering clinician. Results will be communicated as soon as possible. Electronically Signed   By: Jeannine Boga M.D.   On: 10/20/2018 21:02   Ct T-spine No Charge  Result Date: 10/20/2018 CLINICAL DATA:  [place] 5 days ago. Numbness in lower extremities and right rib pain. EXAM: CT THORACIC AND LUMBAR SPINE WITHOUT CONTRAST TECHNIQUE: Multidetector CT imaging of the thoracic and lumbar spine was performed without contrast. Multiplanar CT image reconstructions were also generated. COMPARISON:  CT scan from 2016 FINDINGS: CT THORACIC SPINE FINDINGS Alignment: Scoliosis but normal alignment in the sagittal plane. Vertebrae: No acute bony findings.  Mild degenerative changes. Paraspinal and other soft tissues: No significant paraspinal findings. A moderate-sized left pleural effusion is noted with overlying atelectasis. No obvious posterior rib fractures. Disc levels: The spinal canal is generous. No obvious thoracic disc protrusions or canal compromise. No pedicle, posterior element or facet fractures. CT LUMBAR SPINE FINDINGS Segmentation: There are five lumbar type vertebral bodies. The last full intervertebral disc space is labeled L5-S1. Alignment: Normal Vertebrae: No lumbar fracture is identified. The facets are normally aligned. No facet or laminar fracture. No pars defects. Paraspinal and other soft tissues: No significant paraspinal or retroperitoneal findings. Disc levels: There is a broad-based left paracentral disc protrusion at L5-S1 which appears to contact the left S1 nerve root. The other intervertebral discs are unremarkable. IMPRESSION: CT THORACIC SPINE IMPRESSION Scoliosis and mild degenerative changes but no acute fracture or spinal canal compromise. CT LUMBAR SPINE  IMPRESSION Normal alignment and no acute bony findings. Shallow broad-based left paracentral disc protrusion noted at L5-S1. Electronically Signed   By: Marijo Sanes M.D.   On: 10/20/2018 13:08   Ct L-spine No Charge  Result Date: 10/20/2018 CLINICAL DATA:  [place] 5 days ago. Numbness in lower extremities and right rib pain. EXAM: CT THORACIC AND LUMBAR SPINE WITHOUT CONTRAST TECHNIQUE: Multidetector CT imaging of the thoracic and lumbar spine was performed without contrast. Multiplanar CT image reconstructions were also generated. COMPARISON:  CT scan from 2016 FINDINGS: CT THORACIC SPINE FINDINGS Alignment: Scoliosis but normal alignment in the sagittal plane. Vertebrae: No acute bony findings.  Mild degenerative changes. Paraspinal and other soft tissues: No significant paraspinal findings. A moderate-sized left pleural effusion is noted with overlying atelectasis. No obvious posterior rib fractures. Disc levels: The spinal canal is generous. No obvious thoracic disc protrusions or canal compromise. No pedicle, posterior element or facet fractures. CT LUMBAR SPINE FINDINGS Segmentation: There are five lumbar type vertebral bodies. The  last full intervertebral disc space is labeled L5-S1. Alignment: Normal Vertebrae: No lumbar fracture is identified. The facets are normally aligned. No facet or laminar fracture. No pars defects. Paraspinal and other soft tissues: No significant paraspinal or retroperitoneal findings. Disc levels: There is a broad-based left paracentral disc protrusion at L5-S1 which appears to contact the left S1 nerve root. The other intervertebral discs are unremarkable. IMPRESSION: CT THORACIC SPINE IMPRESSION Scoliosis and mild degenerative changes but no acute fracture or spinal canal compromise. CT LUMBAR SPINE IMPRESSION Normal alignment and no acute bony findings. Shallow broad-based left paracentral disc protrusion noted at L5-S1. Electronically Signed   By: Marijo Sanes M.D.   On:  10/20/2018 13:08    Assessment/Plan: 38 year old female presented to the ED today after a fall 5 days ago and weakness and paraesthesias in her legs 3 days ago. MRI thoracic spine shows a dorsal epidural abscess extending from T7-T10 resulting in severe spinal stenosis. Given her bilateral lower extremity weakness we will take her to the OR tonight for a decompression of her thoracic spine for evacuation of epidural abscess. Plan of care of discussed with patient. We discussed the risk and benefits associated with the surgery, she understood and agrees to move forward.    Ocie Cornfield Bradely Rudin 10/20/2018 10:41 PM

## 2018-10-20 NOTE — Progress Notes (Addendum)
Pharmacy Antibiotic Note  [name] [name] is a 38 y.o. female admitted on 10/20/2018 with concern for epidural abscess.  Pharmacy has been consulted for Vancomycin dosing.  Height: 5\' 2"  (157.5 cm) Weight: 180 lb (81.6 kg) IBW/kg (Calculated) : 50.1  Temp (24hrs), Avg:98.6 F (37 C), Min:98.6 F (37 C), Max:98.6 F (37 C)  Recent Labs  Lab 10/20/18 0926 10/20/18 0927  WBC 20.3*  --   CREATININE 0.59  --   LATICACIDVEN  --  1.0    Estimated Creatinine Clearance: 92.5 mL/min (by C-G formula based on SCr of 0.59 mg/dL).    Allergies  Allergen Reactions  . Epidural Tray 17gx3-1-2" [Nerve Block Tray] Other (See Comments)    Paralysis and severe pain in head/neck/shoulders.  No anaphylaxis.  . Ibuprofen Hives  . Zofran [Ondansetron Hcl] Nausea And Vomiting  . Penicillins Hives    Has patient had a PCN reaction causing immediate rash, facial/tongue/throat swelling, SOB or lightheadedness with hypotension: Yes Has patient had a PCN reaction causing severe rash involving mucus membranes or skin necrosis: No Has patient had a PCN reaction that required hospitalization No Has patient had a PCN reaction occurring within the last 10 years: No If all of the above answers are "NO", then may proceed with Cephalosporin use.     Antimicrobials this admission: Ceftriaxone 9/30 >>  Vancomycin 9/30 >>   Dose adjustments this admission: n/a  Microbiology results: 9/30 BCx: pending  Plan: - Will give loading dose of Vancomycin 1750mg  IV x 1 dose - Maintenance dose of Vancomycin 1250mg  IV q24h  - Will monitor Vancomycin using AUC:MIC dosing   Thank you for allowing pharmacy to be a part of this patient's care.  Duanne Limerick PharmD. BCPS  10/20/2018 1:51 PM

## 2018-10-20 NOTE — Progress Notes (Signed)
Pt with 20g 1.88 inch IV in upper left arm; returned to ED from CT with IV infiltrated. Pt reported that during CT, tech pulled hard on IV, took off tape and pulled back IV and retaped. ED nurse verbalized CT radiologist reported to him that catheter was difficult to flush and he thought the line was kinked; after pulling back and adjusting line, he instilled contrast which did not show up on CT. Pt's arm swollen above IV insertion site. Cold pack applied.

## 2018-10-21 ENCOUNTER — Encounter (HOSPITAL_COMMUNITY): Payer: Self-pay | Admitting: Neurological Surgery

## 2018-10-21 ENCOUNTER — Other Ambulatory Visit: Payer: Self-pay

## 2018-10-21 ENCOUNTER — Inpatient Hospital Stay (HOSPITAL_COMMUNITY): Payer: Medicaid Other

## 2018-10-21 DIAGNOSIS — F1990 Other psychoactive substance use, unspecified, uncomplicated: Secondary | ICD-10-CM

## 2018-10-21 DIAGNOSIS — G061 Intraspinal abscess and granuloma: Secondary | ICD-10-CM

## 2018-10-21 DIAGNOSIS — I361 Nonrheumatic tricuspid (valve) insufficiency: Secondary | ICD-10-CM

## 2018-10-21 DIAGNOSIS — K5900 Constipation, unspecified: Secondary | ICD-10-CM

## 2018-10-21 DIAGNOSIS — R339 Retention of urine, unspecified: Secondary | ICD-10-CM

## 2018-10-21 DIAGNOSIS — I76 Septic arterial embolism: Secondary | ICD-10-CM

## 2018-10-21 HISTORY — DX: Intraspinal abscess and granuloma: G06.1

## 2018-10-21 LAB — CBC
HCT: 31.1 % — ABNORMAL LOW (ref 36.0–46.0)
Hemoglobin: 10 g/dL — ABNORMAL LOW (ref 12.0–15.0)
MCH: 28.2 pg (ref 26.0–34.0)
MCHC: 32.2 g/dL (ref 30.0–36.0)
MCV: 87.6 fL (ref 80.0–100.0)
Platelets: 712 10*3/uL — ABNORMAL HIGH (ref 150–400)
RBC: 3.55 MIL/uL — ABNORMAL LOW (ref 3.87–5.11)
RDW: 14.6 % (ref 11.5–15.5)
WBC: 19.8 10*3/uL — ABNORMAL HIGH (ref 4.0–10.5)
nRBC: 0 % (ref 0.0–0.2)

## 2018-10-21 LAB — COMPREHENSIVE METABOLIC PANEL
ALT: 32 U/L (ref 0–44)
AST: 51 U/L — ABNORMAL HIGH (ref 15–41)
Albumin: 2.3 g/dL — ABNORMAL LOW (ref 3.5–5.0)
Alkaline Phosphatase: 406 U/L — ABNORMAL HIGH (ref 38–126)
Anion gap: 14 (ref 5–15)
BUN: 11 mg/dL (ref 6–20)
CO2: 20 mmol/L — ABNORMAL LOW (ref 22–32)
Calcium: 8.5 mg/dL — ABNORMAL LOW (ref 8.9–10.3)
Chloride: 99 mmol/L (ref 98–111)
Creatinine, Ser: 0.55 mg/dL (ref 0.44–1.00)
GFR calc Af Amer: >60 mL/min (ref >60)
GFR calc non Af Amer: >60 mL/min (ref >60)
Glucose, Bld: 194 mg/dL — ABNORMAL HIGH (ref 70–99)
Potassium: 3.7 mmol/L (ref 3.5–5.1)
Sodium: 133 mmol/L — ABNORMAL LOW (ref 135–145)
Total Bilirubin: 1.2 mg/dL (ref 0.3–1.2)
Total Protein: 7.5 g/dL (ref 6.5–8.1)

## 2018-10-21 LAB — ECHOCARDIOGRAM COMPLETE
Height: 62 in
Weight: 3118.19 oz

## 2018-10-21 LAB — HIV ANTIBODY (ROUTINE TESTING W REFLEX): HIV Screen 4th Generation wRfx: NONREACTIVE

## 2018-10-21 LAB — MRSA PCR SCREENING: MRSA by PCR: NEGATIVE

## 2018-10-21 MED ORDER — PHENOL 1.4 % MT LIQD: 1.0000 | OROMUCOSAL | Status: DC | PRN

## 2018-10-21 MED ORDER — FENTANYL CITRATE (PF) 250 MCG/5ML IJ SOLN
INTRAMUSCULAR | Status: AC
  Filled 2018-10-21: qty 5

## 2018-10-21 MED ORDER — SUGAMMADEX SODIUM 200 MG/2ML IV SOLN
INTRAVENOUS | Status: DC | PRN
  Administered 2018-10-21: 150 mg via INTRAVENOUS
  Administered 2018-10-21: 50 mg via INTRAVENOUS

## 2018-10-21 MED ORDER — SODIUM CHLORIDE 0.9% FLUSH
3.0000 mL | Freq: Two times a day (BID) | INTRAVENOUS | Status: DC
  Administered 2018-10-21 – 2018-10-22 (×3): 3 mL via INTRAVENOUS

## 2018-10-21 MED ORDER — FLEET ENEMA 7-19 GM/118ML RE ENEM
1.0000 | ENEMA | Freq: Once | RECTAL | Status: AC
  Administered 2018-10-21: 1 via RECTAL
  Filled 2018-10-21: qty 1

## 2018-10-21 MED ORDER — DIPHENHYDRAMINE HCL 50 MG/ML IJ SOLN
INTRAMUSCULAR | Status: DC | PRN
  Administered 2018-10-21: 12.5 mg via INTRAVENOUS

## 2018-10-21 MED ORDER — VANCOMYCIN HCL 1000 MG IV SOLR
INTRAVENOUS | Status: DC | PRN
  Administered 2018-10-21: 1000 mg

## 2018-10-21 MED ORDER — HYDROMORPHONE HCL 1 MG/ML IJ SOLN
0.5000 mg | Freq: Once | INTRAMUSCULAR | Status: AC
  Administered 2018-10-21: 0.5 mg via INTRAVENOUS

## 2018-10-21 MED ORDER — LIDOCAINE HCL 1 % IJ SOLN
INTRAMUSCULAR | Status: AC
  Filled 2018-10-21: qty 20

## 2018-10-21 MED ORDER — SENNA 8.6 MG PO TABS
1.0000 | ORAL_TABLET | Freq: Two times a day (BID) | ORAL | Status: DC
  Administered 2018-10-21 – 2018-11-03 (×15): 8.6 mg via ORAL
  Filled 2018-10-21 (×25): qty 1

## 2018-10-21 MED ORDER — HYDROMORPHONE HCL 1 MG/ML IJ SOLN
0.5000 mg | INTRAMUSCULAR | Status: DC | PRN
  Administered 2018-10-21 (×4): 0.5 mg via INTRAVENOUS
  Filled 2018-10-21 (×5): qty 1

## 2018-10-21 MED ORDER — OXYCODONE HCL 5 MG PO TABS
10.0000 mg | ORAL_TABLET | ORAL | Status: DC | PRN
  Administered 2018-10-21 – 2018-10-26 (×20): 10 mg via ORAL
  Filled 2018-10-21 (×20): qty 2

## 2018-10-21 MED ORDER — ACETAMINOPHEN 325 MG PO TABS
650.0000 mg | ORAL_TABLET | ORAL | Status: DC | PRN
  Administered 2018-10-21 – 2018-11-08 (×12): 650 mg via ORAL
  Filled 2018-10-21 (×13): qty 2

## 2018-10-21 MED ORDER — POTASSIUM CHLORIDE IN NACL 20-0.9 MEQ/L-% IV SOLN
INTRAVENOUS | Status: DC
  Administered 2018-10-21: 03:00:00 via INTRAVENOUS
  Filled 2018-10-21 (×2): qty 1000

## 2018-10-21 MED ORDER — ACETAMINOPHEN 650 MG RE SUPP: 650.0000 mg | RECTAL | Status: DC | PRN

## 2018-10-21 MED ORDER — VANCOMYCIN HCL 1000 MG IV SOLR
INTRAVENOUS | Status: AC
  Filled 2018-10-21: qty 1000

## 2018-10-21 MED ORDER — THROMBIN 5000 UNITS EX SOLR
OROMUCOSAL | Status: DC | PRN
  Administered 2018-10-21: 5 mL via TOPICAL

## 2018-10-21 MED ORDER — SODIUM CHLORIDE 0.9% FLUSH: 3.0000 mL | INTRAVENOUS | Status: DC | PRN

## 2018-10-21 MED ORDER — HYDROXYZINE HCL 25 MG PO TABS
50.0000 mg | ORAL_TABLET | Freq: Once | ORAL | Status: AC
  Administered 2018-10-21: 50 mg via ORAL
  Filled 2018-10-21: qty 2

## 2018-10-21 MED ORDER — METHOCARBAMOL 1000 MG/10ML IJ SOLN
500.0000 mg | Freq: Four times a day (QID) | INTRAVENOUS | Status: DC | PRN
  Filled 2018-10-21: qty 5

## 2018-10-21 MED ORDER — OXYCODONE HCL 5 MG PO TABS
10.0000 mg | ORAL_TABLET | ORAL | Status: DC | PRN
  Administered 2018-10-21 (×3): 10 mg via ORAL
  Filled 2018-10-21 (×3): qty 2

## 2018-10-21 MED ORDER — DEXMEDETOMIDINE HCL 200 MCG/2ML IV SOLN
INTRAVENOUS | Status: DC | PRN
  Administered 2018-10-21: 12 ug via INTRAVENOUS
  Administered 2018-10-21: 4 ug via INTRAVENOUS
  Administered 2018-10-21: 12 ug via INTRAVENOUS
  Administered 2018-10-21: 2 ug via INTRAVENOUS

## 2018-10-21 MED ORDER — METHOCARBAMOL 500 MG PO TABS
500.0000 mg | ORAL_TABLET | Freq: Four times a day (QID) | ORAL | Status: DC | PRN
  Administered 2018-10-21 – 2018-11-07 (×20): 500 mg via ORAL
  Filled 2018-10-21 (×21): qty 1

## 2018-10-21 MED ORDER — MENTHOL 3 MG MT LOZG: 1.0000 | LOZENGE | OROMUCOSAL | Status: DC | PRN

## 2018-10-21 MED ORDER — SODIUM CHLORIDE 0.9 % IV SOLN
250.0000 mL | INTRAVENOUS | Status: DC
  Administered 2018-10-22: 250 mL via INTRAVENOUS

## 2018-10-21 MED ORDER — HYDROMORPHONE HCL 1 MG/ML IJ SOLN
1.0000 mg | INTRAMUSCULAR | Status: DC | PRN
  Administered 2018-10-21 – 2018-10-24 (×22): 1 mg via INTRAVENOUS
  Filled 2018-10-21 (×23): qty 1

## 2018-10-21 MED FILL — Thrombin For Soln 5000 Unit: CUTANEOUS | Qty: 5000 | Status: AC

## 2018-10-21 MED FILL — Gelatin Absorbable MT Powder: OROMUCOSAL | Qty: 1 | Status: AC

## 2018-10-21 NOTE — Progress Notes (Signed)
Rehab Admissions Coordinator Note:  Patient was screened by Cleatrice Burke for appropriateness for an Inpatient Acute Rehab Consult per PT recs pending how patient develops tolerance for more activity. Pain is a barrier. I will follow.  Cleatrice Burke RN MSN 10/21/2018, 1:22 PM  I can be reached at [phone].

## 2018-10-21 NOTE — Progress Notes (Signed)
Patient brought to IR for possible thoracentesis.   Patient unable to position for procedure-- could not sit up, could not roll despite max encouragement and assistance.  Refuses to proceed due to pain.  No procedure performed today.   Will continue to work towards procedure.   Brynda Greathouse, MS RD PA-C

## 2018-10-21 NOTE — Progress Notes (Signed)
Pt agrees to go down to IR. Multiple attempts made to call IR for next appointment time available. Will continue to call for an appointment time.

## 2018-10-21 NOTE — Transfer of Care (Signed)
Immediate Anesthesia Transfer of Care Note  Patient: [name] [name]  Procedure(s) Performed: THORACIC Eight, Thoracic nine LAMINECTOMY FOR EPIDURAL ABSCESS (N/A Back)  Patient Location: PACU  Anesthesia Type:General  Level of Consciousness: awake, alert  and oriented  Airway & Oxygen Therapy: Patient Spontanous Breathing and Patient connected to face mask oxygen  Post-op Assessment: Report given to RN and Post -op Vital signs reviewed and stable  Post vital signs: Reviewed and stable  Last Vitals:  Vitals Value Taken Time  BP 112/73 10/21/18 0057  Temp    Pulse 112 10/21/18 0100  Resp 21 10/21/18 0100  SpO2 94% 10/21/18 0100  Vitals shown include unvalidated device data.  Last Pain:  Vitals:   10/20/18 2206  TempSrc: Oral  PainSc:          Complications: No apparent anesthesia complications

## 2018-10-21 NOTE — Progress Notes (Signed)
Per Dr. Ronnald Ramp, okay for patient to return 2W12 post op.  VSS.  Will continue to monitor.

## 2018-10-21 NOTE — Progress Notes (Signed)
Subjective: Patient examined at bed side this morning. She continues to have chest pain with inspiration and pain after the spine decompression surgery. She report leg numbness but with improved discrimination of light touch on the legs.   Objective: Vital signs in last 24 hours: Vitals:   10/21/18 0620 10/21/18 0714 10/21/18 0754 10/21/18 0925  BP: 121/78 121/80    Pulse: (!) 106 (!) 118 (!) 102 (!) 106  Resp: 18 (!) 35 (!) 24 (!) 21  Temp: 98.9 F (37.2 C) 99.6 F (37.6 C)    TempSrc: Oral Oral    SpO2: 99% 99% 95% 100%  Weight:      Height:       Physical Exam: General: Alert but distressed and in pain Cardiovascular: S1S2 RRR w/o m/r/g.  Pulmonary: Clear to ausculation, breathing shallow and quick  Abdomen: Normoactive bowel sounds. No rebound or guarding with light palpation. Extremities: Good sensation to temperature and touch. Good strength in legs and feet.   Lab Results: CBC Latest Ref Rng & Units 10/21/2018 10/20/2018 04/10/2018  WBC 4.0 - 10.5 K/uL 19.8(H) 20.3(H) 7.4  Hemoglobin 12.0 - 15.0 g/dL 10.0(L) 12.9 12.1  Hematocrit 36.0 - 46.0 % 31.1(L) 38.7 37.9  Platelets 150 - 400 K/uL 712(H) 580(H) 289   CMP Latest Ref Rng & Units 10/21/2018 10/20/2018 04/10/2018  Glucose 70 - 99 mg/dL 194(H) 119(H) 92  BUN 6 - 20 mg/dL 11 12 12   Creatinine 0.44 - 1.00 mg/dL 0.55 0.59 0.60  Sodium 135 - 145 mmol/L 133(L) 130(L) 136  Potassium 3.5 - 5.1 mmol/L 3.7 4.0 4.0  Chloride 98 - 111 mmol/L 99 94(L) 103  CO2 22 - 32 mmol/L 20(L) 23 26  Calcium 8.9 - 10.3 mg/dL 8.5(L) 9.0 9.1  Total Protein 6.5 - 8.1 g/dL 7.5 8.3(H) 8.0  Total Bilirubin 0.3 - 1.2 mg/dL 1.2 1.1 0.5  Alkaline Phos 38 - 126 U/L 406(H) 338(H) 121  AST 15 - 41 U/L 51(H) 36 25  ALT 0 - 44 U/L 32 25 39   Micro Results: Blood cx: No growth in <12 hrs  Epidural abcess cx: Moderate g(+) cocci with PMNs, culture pending  MRSA PCR: Negative   Studies/Results:  CT Chest/ Abdomen / Pelvis w/o contrast (9/30):  Moderate-sized complex appearing, likely loculated left-sided pleural effusion with compressive left lower lobe atelectasis. Trace right-sided pleural effusion with adjacent compressive atelectasis. No pneumothorax. No pericardial effusion or dilated aorta. Normal sized heart.  Foci of subq gas in both breast likely site of injection or a developing infection. No acute intra abdominal or pelvic injury or displaced rib fracture.  CT Thoracic Spine (9/30): Scoliosis and mild degenerative changes but no acute fracture or spinal canal compromise. Mild degenerative changes.  CT Lumbar Spine (9/30): Normal alignment and no acute bony findings. Shallow broad-based left paracentral disc protrusion noted at L5-S1.  CXR 2V (9/30): Mild left basilar atelectasis or infiltrate is noted with associated pleural effusion.  MR Head (9/30): No acute intracranial abnormality. Small remote left thalamic lacunar infarct.   MR Cervical Spine (9/30): No acute abnormality of cervical spine.   MR Thoracic Spine (9/30): Approximate 0.7 x 2.2 x 6.8 cm dorsal epidural abscess extending from T7 through T10 with resultant severe spinal stenosis. Reactive marrow edema and enhancement about the left T8-9 facet, concerning for septic arthritis, with possible involvement of the adjacent T8 and T9 costovertebral articulations. No overt evidence for discitis at this time.  MR Lumbar Spine (9/30): No acute abnormality of lumbar  spine.   Assessment/Plan: Active Problems:   Loculated pleural effusion   Spinal epidural abscess  Thoracic spine epidural abscess: 3 day history of numbness below T8 dermatomal level down to legs. Numbness progressed to paraesthesia and weakness in the ED. Also 2 day hx of constipation and 1 day hx of urinary retention, >999 ml on bladder scanner at ED. MR thoracic spine on 9/30 with dorsal epidural abscess extending from T7 through T10. Abscess decompressed by neurosurgery and gram stain was positive  for g+ cocci, culture still pending. Leukocytosis down trending. Started on vancomycin.  *follow abscess culture  *Received vancomycin, will continue.   Loculated left-sided pleural effusion: Non-radiating left sided chest pain after a fall 5 days ago. Pain worse with inspiration and non radiating. Denies fever, cough, sore throat, recent URI. CXR and CT chest with loculated left-sided pleural effusion with compressive left lower lobe atelectasis c/w chest pain. Also foci of subq gas in both breast likely site of injection that could have contributed to the possible effusion or empyema. Endocarditis complicated with effusion also considered. Elevated WBC downtrending. IR consulted and pleural effusion likely to be drained today.   * Has been NPO for effusion drainage.  * Follow Echo for possible endocarditis complicated by pleural effusion.  * Received vancomycin + cefepime in ER, will continue IV Cefepime 2g + Metronidazole 500 mg + Vancomycin 1,250 mg  * Tylenol 650 mg Q6HR PRN for mild pain + Dilaudid 1 mg Q6HR PRN for moderate or severe pain  Opioid use disorder: History of opioid use started for management of chronic low back pain. IV drug use history managed with Suboxone. Relapse 03/2018. Last use 6 hours prior to arrival.  *Follow HIV abs and RPR *Suboxone at discharge  Diet: NPO for effusion drainage DVT ppx: Heparin 5,000 units  Dispo: Admit to floor   This is a Careers information officer Note.  The care of the patient was discussed with Dr. Marianna Payment and the assessment and plan formulated with their assistance.  Please see their attached note for official documentation of the daily encounter.   LOS: 1 day   Evaristo Bury, Medical Student 10/21/2018, 1:01 PM

## 2018-10-21 NOTE — Progress Notes (Signed)
  Echocardiogram 2D Echocardiogram has been performed.  [name] [name] 10/21/2018, 2:10 PM

## 2018-10-21 NOTE — Progress Notes (Signed)
OT Cancellation Note  Patient Details Name: [name] [name] MRN: [id number] DOB: [birth year]   Cancelled Treatment:    Reason Eval/Treat Not Completed: Other (comment)(Pt going to IR today; unable to participate in OT d/t pain)  Pt worked minimally with PT and OTR believes tomorrow will be a better day for OT eval.   Ebony Hail Harold Hedge) Marsa Aris OTR/L [place] Pager: [phone] Office: [place] 10/21/2018, 2:09 PM

## 2018-10-21 NOTE — Progress Notes (Addendum)
Patient ID: [name] [name], female   DOB: 1980-08-26, 38 y.o.   MRN: [id number] Looks good post-op, appropriately sore, legs feel a little better but still some numbness, moving legs well, drain with little output, GS is GPC, on vanco. Should L chest effusion be drained by IR or CTS???? Otherwise, based on my previous experience it may be very difficult to treat with ABX alone and lead to further contiguous infection of the spine

## 2018-10-21 NOTE — Progress Notes (Signed)
Pt refuses to go down for thoracentesis procedure after multiple requests. States that she wants to wait until boyfriend arrives for a visit before going down for procedure.
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
## 2018-10-21 NOTE — Op Note (Signed)
10/20/2018 - 10/21/2018  12:47 AM  PATIENT:  [name] [name]  38 y.o. female  PRE-OPERATIVE DIAGNOSIS: Thoracic epidural abscess  POST-OPERATIVE DIAGNOSIS:  same  PROCEDURE: T8 and T9 laminectomy and medial facetectomy for evacuation of thoracic epidural abscess  SURGEON:  Sherley Bounds, MD  ASSISTANTS: Juanda Bond, FNP  ANESTHESIA:   General  EBL: 100 ml  Total I/O In: 900 [I.V.:900] Out: 950 [Urine:850; Blood:100]  BLOOD ADMINISTERED: none  DRAINS: Medium Hemovac  SPECIMEN:  none  INDICATION FOR PROCEDURE: This patient presented with weakness in her legs and urinary retention and numbness in her legs. Imaging showed thoracic epidural abscess dorsal to the spinal cord at T8 and T9.  Recommended thoracic laminectomy for evacuation of epidural abscess. Patient understood the risks, benefits, and alternatives and potential outcomes and wished to proceed.  PROCEDURE DETAILS: The patient was taken to the operating room and after induction of adequate generalized endotracheal anesthesia, the patient was rolled into the prone position on chest rolls and all pressure points were padded.  Our incision was marked with AP fluoroscopy. the thoracic and lumbar region was cleaned and then prepped with DuraPrep and draped in the usual sterile fashion. 5 cc of local anesthesia was injected and then a dorsal midline incision was made and carried down to the thoracic fascia. The fascia was opened and the paraspinous musculature was taken down in a subperiosteal fashion to expose T8 and T9 bilaterally. Intraoperative fluoroscopy confirmed my level, and then I moved the spinous processes of T8 and T9 and used a combination of the high-speed drill and the Kerrison punches to perform a laminectomy, medial facetectomy, and foraminotomy at T7-8 T8-9 and T9-10 bilaterally.  Purulence was seen especially on the left at the T8-9 level and dorsal to the cord.  This was cultured.  The fat was edematous and stuck  to the dura.  The epidural fat was also removed as well as all purulence.  We worked in the lateral recesses to remove more purulence.  The underlying yellow ligament was opened and removed in a piecemeal fashion to expose the underlying dura. I undercut the lateral recess and dissected down until I was medial to and distal to the pedicles. The spinal cord appeared to be well decompressed. I irrigated with saline solution containing bacitracin. Achieved hemostasis with bipolar cautery, lined the dura with Gelfoam and vancomycin powder, placed a medium Hemovac drain through a separate stab incision and then closed the fascia with 0 Vicryl. I closed the subcutaneous tissues with 2-0 Vicryl and the subcuticular tissues with 3-0 Vicryl. The skin was then closed with benzoin and Steri-Strips. The drapes were removed, a sterile dressing was applied. The patient was awakened from general anesthesia and transferred to the recovery room in stable condition. At the end of the procedure all sponge, needle and instrument counts were correct.    PLAN OF CARE: Admit to inpatient   PATIENT DISPOSITION:  PACU - hemodynamically stable.   Delay start of Pharmacological VTE agent (>24hrs) due to surgical blood loss or risk of bleeding:  yes

## 2018-10-22 ENCOUNTER — Inpatient Hospital Stay (HOSPITAL_COMMUNITY): Payer: Medicaid Other

## 2018-10-22 DIAGNOSIS — B9561 Methicillin susceptible Staphylococcus aureus infection as the cause of diseases classified elsewhere: Secondary | ICD-10-CM

## 2018-10-22 LAB — COMPREHENSIVE METABOLIC PANEL
ALT: 28 U/L (ref 0–44)
AST: 36 U/L (ref 15–41)
Albumin: 2 g/dL — ABNORMAL LOW (ref 3.5–5.0)
Alkaline Phosphatase: 331 U/L — ABNORMAL HIGH (ref 38–126)
Anion gap: 14 (ref 5–15)
BUN: 7 mg/dL (ref 6–20)
CO2: 19 mmol/L — ABNORMAL LOW (ref 22–32)
Calcium: 8.3 mg/dL — ABNORMAL LOW (ref 8.9–10.3)
Chloride: 97 mmol/L — ABNORMAL LOW (ref 98–111)
Creatinine, Ser: 0.52 mg/dL (ref 0.44–1.00)
GFR calc Af Amer: >60 mL/min (ref >60)
GFR calc non Af Amer: >60 mL/min (ref >60)
Glucose, Bld: 111 mg/dL — ABNORMAL HIGH (ref 70–99)
Potassium: 3.5 mmol/L (ref 3.5–5.1)
Sodium: 130 mmol/L — ABNORMAL LOW (ref 135–145)
Total Bilirubin: 0.7 mg/dL (ref 0.3–1.2)
Total Protein: 6.8 g/dL (ref 6.5–8.1)

## 2018-10-22 LAB — CBC
HCT: 35.4 % — ABNORMAL LOW (ref 36.0–46.0)
Hemoglobin: 11.5 g/dL — ABNORMAL LOW (ref 12.0–15.0)
MCH: 28.3 pg (ref 26.0–34.0)
MCHC: 32.5 g/dL (ref 30.0–36.0)
MCV: 87 fL (ref 80.0–100.0)
Platelets: 524 10*3/uL — ABNORMAL HIGH (ref 150–400)
RBC: 4.07 MIL/uL (ref 3.87–5.11)
RDW: 14.6 % (ref 11.5–15.5)
WBC: 16.8 10*3/uL — ABNORMAL HIGH (ref 4.0–10.5)
nRBC: 0 % (ref 0.0–0.2)

## 2018-10-22 LAB — MAGNESIUM: Magnesium: 1.8 mg/dL (ref 1.7–2.4)

## 2018-10-22 LAB — RPR: RPR Ser Ql: NONREACTIVE

## 2018-10-22 MED ORDER — HYDROMORPHONE HCL 1 MG/ML IJ SOLN
1.0000 mg | Freq: Once | INTRAMUSCULAR | Status: AC | PRN
  Administered 2018-10-22: 1 mg via INTRAVENOUS

## 2018-10-22 MED ORDER — LIDOCAINE HCL 1 % IJ SOLN
INTRAMUSCULAR | Status: AC
  Filled 2018-10-22: qty 20

## 2018-10-22 MED ORDER — SODIUM CHLORIDE 0.9 % IV SOLN
INTRAVENOUS | Status: AC
  Administered 2018-10-22 – 2018-10-23 (×2): via INTRAVENOUS

## 2018-10-22 MED ORDER — PROCHLORPERAZINE EDISYLATE 10 MG/2ML IJ SOLN: 10.0000 mg | Freq: Four times a day (QID) | INTRAMUSCULAR | Status: DC | PRN

## 2018-10-22 NOTE — Progress Notes (Signed)
Patient ID: [name] [name], female   DOB: 11-06-1980, 38 y.o.   MRN: [id number] L chest pain remains severe and limiting, legs feel "numb," moves legs well, hasn't mobilized due to pain, on iv abx

## 2018-10-22 NOTE — Progress Notes (Deleted)
Check on patient.  She was resting.  Will take her vitals every 2 hours.

## 2018-10-22 NOTE — Progress Notes (Signed)
..    Vital Signs MEWS/VS Documentation      10/21/2018 1910 10/21/2018 1929 10/21/2018 2315 10/22/2018 0002   MEWS Score:  3  3  3  3    MEWS Score Color:  Yellow  Yellow  Yellow  Yellow   Resp:  -  (!) 26  -  (!) 29   Pulse:  -  99  -  (!) 109   BP:  -  130/74  -  127/79   Temp:  -  98.7 F (37.1 C)  -  98.6 F (37 C)   O2 Device:  -  Room Air  -  Room Air   Level of Consciousness:  -  -  Alert  -      Checked on patient.  She was resting.  No acute change in status.  Will monitor vitals every two hours.     Jacqulyn Ducking 10/22/2018,12:27 AM
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
## 2018-10-22 NOTE — Anesthesia Postprocedure Evaluation (Signed)
Anesthesia Post Note  Patient: [name] [name]  Procedure(s) Performed: THORACIC Eight, Thoracic nine LAMINECTOMY FOR EPIDURAL ABSCESS (N/A Back)     Patient location during evaluation: PACU Anesthesia Type: General Level of consciousness: awake and alert Pain management: pain level controlled Vital Signs Assessment: post-procedure vital signs reviewed and stable Respiratory status: spontaneous breathing, nonlabored ventilation, respiratory function stable and patient connected to nasal cannula oxygen Cardiovascular status: blood pressure returned to baseline and stable Postop Assessment: no apparent nausea or vomiting Anesthetic complications: no    Last Vitals:  Vitals:   10/22/18 0527 10/22/18 0934  BP: 127/84   Pulse: (!) 105   Resp: 18   Temp: 37.1 C 36.9 C  SpO2: 97%     Last Pain:  Vitals:   10/22/18 0934  TempSrc: Oral  PainSc:                  [place] S

## 2018-10-22 NOTE — Progress Notes (Signed)
..    Vital Signs MEWS/VS Documentation      10/21/2018 1929 10/21/2018 2315 10/22/2018 0002 10/22/2018 0418   MEWS Score:  3  3  3  4    MEWS Score Color:  Yellow  Yellow  Yellow  Red   Resp:  (!) 26  -  (!) 29  (!) 27   Pulse:  99  -  (!) 109  (!) 111   BP:  130/74  -  127/79  135/83   Temp:  98.7 F (37.1 C)  -  98.6 F (37 C)  98.9 F (37.2 C)   O2 Device:  Room Air  -  Room Air  Room Air   Level of Consciousness:  -  Alert  -  -     Patient was in pain.  Administered pain medicine.  Patient is alert and oriented.  No acute changes in patient's status.  Will monitor vitals every 15 minutes for an hour.      Jacqulyn Ducking 10/22/2018,4:47 AM
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
## 2018-10-22 NOTE — Progress Notes (Signed)
Inpatient Rehabilitation Admissions Coordinator  Therapy has changed their recommendations to [facility] recommendations. Not in need of an inpt rehab admit at this time.  Danne Baxter, RN, MSN Rehab Admissions Coordinator [phone] 10/22/2018 3:41 PM

## 2018-10-22 NOTE — Progress Notes (Signed)
Patient given pain medication on unit prior to coming to radiology for thoracentesis today.  She was able to position on the side of the bed for 1-2 minutes before stating "I can't do this. I don't want to do this anymore."  Offered encouragement and rest prior to terminating procedure, however patient refuses to proceed.   Limited US Chest performed prior to returning to bed.   Images available for review.  Patient does have a very small amount of loculated fluid which is likely amenable to thoracentesis if patient can eventually tolerate.   Brynda Greathouse, MS RD PA-C

## 2018-10-22 NOTE — Progress Notes (Signed)
    [facility] has been requested to perform a transesophageal echocardiogram on [name] [name] for bacteremia.  After careful review of history and examination, the risks and benefits of transesophageal echocardiogram have been explained including risks of esophageal damage, perforation (1:10,000 risk), bleeding, pharyngeal hematoma as well as other potential complications associated with conscious sedation including aspiration, arrhythmia, respiratory failure and death. Alternatives to treatment were discussed, questions were answered. Patient is willing to proceed.   Fareedah Mahler Ninfa Meeker, PA-C  10/22/2018 3:08 PM

## 2018-10-22 NOTE — Progress Notes (Signed)
Subjective: Patient examined at bed side this morning. She looks better than she did yesterday. She reports chest pain and pain medication is not reliving her pain. Reports legs are doing better.   Objective: Vital signs in last 24 hours: Vitals:   10/22/18 0418 10/22/18 0527 10/22/18 0615 10/22/18 0934  BP: 135/83 127/84    Pulse: (!) 111 (!) 105    Resp: (!) 27 18    Temp: 98.9 F (37.2 C) 98.7 F (37.1 C)  98.5 F (36.9 C)  TempSrc: Oral Oral  Oral  SpO2: 95% 97%    Weight:   87.4 kg   Height:       Physical Exam: General: Alert but distressed and in pain Cardiovascular: S1S2 RRR w/o m/r/g.  Pulmonary: Clear to ausculation, good work of breathing.   Abdomen: Normoactive bowel sounds. No rebound or guarding with light palpation. Extremities: Good sensation to temperature and touch. Able to move legs without pain.   Lab Results: CBC Latest Ref Rng & Units 10/22/2018 10/21/2018 10/20/2018  WBC 4.0 - 10.5 K/uL 16.8(H) 19.8(H) 20.3(H)  Hemoglobin 12.0 - 15.0 g/dL 11.5(L) 10.0(L) 12.9  Hematocrit 36.0 - 46.0 % 35.4(L) 31.1(L) 38.7  Platelets 150 - 400 K/uL 524(H) 712(H) 580(H)   CMP Latest Ref Rng & Units 10/22/2018 10/21/2018 10/20/2018  Glucose 70 - 99 mg/dL 111(H) 194(H) 119(H)  BUN 6 - 20 mg/dL 7 11 12   Creatinine 0.44 - 1.00 mg/dL 0.52 0.55 0.59  Sodium 135 - 145 mmol/L 130(L) 133(L) 130(L)  Potassium 3.5 - 5.1 mmol/L 3.5 3.7 4.0  Chloride 98 - 111 mmol/L 97(L) 99 94(L)  CO2 22 - 32 mmol/L 19(L) 20(L) 23  Calcium 8.9 - 10.3 mg/dL 8.3(L) 8.5(L) 9.0  Total Protein 6.5 - 8.1 g/dL 6.8 7.5 8.3(H)  Total Bilirubin 0.3 - 1.2 mg/dL 0.7 1.2 1.1  Alkaline Phos 38 - 126 U/L 331(H) 406(H) 338(H)  AST 15 - 41 U/L 36 51(H) 36  ALT 0 - 44 U/L 28 32 25   Micro Results: Blood cx: No growth in 2 days  Surgical/deep wound cx: Moderate g(+) cocci with PMNs, moderate staphylococcus aureus with susceptibilities to follow  MRSA PCR: Negative   Studies/Results:  CT Chest/ Abdomen /  Pelvis w/o contrast (9/30): Moderate-sized complex appearing, likely loculated left-sided pleural effusion with compressive left lower lobe atelectasis. Trace right-sided pleural effusion with adjacent compressive atelectasis. No pneumothorax. No pericardial effusion or dilated aorta. Normal sized heart.  Foci of subq gas in both breast likely site of injection or a developing infection. No acute intra abdominal or pelvic injury or displaced rib fracture.  CT Thoracic Spine (9/30): Scoliosis and mild degenerative changes but no acute fracture or spinal canal compromise. Mild degenerative changes.  CT Lumbar Spine (9/30): Normal alignment and no acute bony findings. Shallow broad-based left paracentral disc protrusion noted at L5-S1.  CXR 2V (9/30): Mild left basilar atelectasis or infiltrate is noted with associated pleural effusion.  MR Head (9/30): No acute intracranial abnormality. Small remote left thalamic lacunar infarct.   MR Cervical Spine (9/30): No acute abnormality of cervical spine.   MR Thoracic Spine (9/30): Approximate 0.7 x 2.2 x 6.8 cm dorsal epidural abscess extending from T7 through T10 with resultant severe spinal stenosis. Reactive marrow edema and enhancement about the left T8-9 facet, concerning for septic arthritis, with possible involvement of the adjacent T8 and T9 costovertebral articulations. No overt evidence for discitis at this time.  MR Lumbar Spine (9/30): No acute abnormality of  lumbar spine.   Echo (10/01): Normal LV function with EF of 60-65%. Mitral, tricuspid and pulmonic valves normal in structure. Aortic valve not well visualized.   Assessment/Plan: Principal Problem:   Septic embolism (HCC) Active Problems:   Loculated pleural effusion   Spinal epidural abscess   IVDU (intravenous drug user)  Thoracic spine epidural abscess: 3 day hx of numbness below T8 dermatomal level down to legs. Numbness progressed to paraesthesia and weakness in the ED.  Also 2 day hx of constipation and 1 day hx of urinary retention, >999 ml on bladder scanner at ED. MR thoracic spine on 9/30 with dorsal epidural abscess extending from T7 through T10. Abscess removed and T8/T9 laminectomy by neurosurgery on 10/01. Wound culture grew Staph. Aureus. Leukocytosis down trending. Continuing vancomycin.   *Continue vancomycin.   Loculated left-sided pleural effusion: Non-radiating left sided chest pain after a fall 5 days ago. Pain worse with inspiration and non radiating. Denies fever, cough, sore throat, recent URI. CXR and CT chest with loculated left-sided pleural effusion with compressive left lower lobe atelectasis c/w chest pain. Also foci of subq gas in both breast likely site of injection that could have contributed to the possible effusion or empyema. Echo negative for endocarditis yesterday but considering septic emboli secondary to bacterial endocarditis, that led to a pleural effusion/empyema.  Elevated WBC downtrending. IR consulted. Patient could not undergo thoracentesis yesterday due to pain. Will try again with PRN Dilaudid before thoracentesis today.  * Thoracentesis today with Dilaudid 1 mg before the procedure.  * Received vancomycin + cefepime in ER, will continue IV Cefepime 2g + Metronidazole 500 mg + Vancomycin 1,250 mg  * Tylenol 650 mg q6hr PRN for mild pain + Dilaudid 1 mg q3hr PRN for severe pain +  Oxycodone 10 mg q4hr PRN for severe pain  * Cards consulted. TEE likely Monday to look for bacterial endocarditis   Opioid use disorder: History of opioid use started for management of chronic low back pain. IV drug use history managed with Suboxone. Relapse 03/2018. Last use 6 hours prior to arrival. RPR and HIV antibody non-reactive.  *Suboxone at discharge   Diet: Heart healthy diet DVT ppx: Heparin 5,000 units  Dispo: Admit to floor   This is a Careers information officer Note.  The care of the patient was discussed with Dr. Philipp Ovens and the assessment and  plan formulated with their assistance.  Please see their attached note for official documentation of the daily encounter.   LOS: 2 days   Evaristo Bury, Medical Student 10/22/2018, 10:58 AM

## 2018-10-23 DIAGNOSIS — I76 Septic arterial embolism: Secondary | ICD-10-CM

## 2018-10-23 LAB — CBC
HCT: 31.5 % — ABNORMAL LOW (ref 36.0–46.0)
Hemoglobin: 10.7 g/dL — ABNORMAL LOW (ref 12.0–15.0)
MCH: 28.8 pg (ref 26.0–34.0)
MCHC: 34 g/dL (ref 30.0–36.0)
MCV: 84.9 fL (ref 80.0–100.0)
Platelets: 627 10*3/uL — ABNORMAL HIGH (ref 150–400)
RBC: 3.71 MIL/uL — ABNORMAL LOW (ref 3.87–5.11)
RDW: 14.2 % (ref 11.5–15.5)
WBC: 16.3 10*3/uL — ABNORMAL HIGH (ref 4.0–10.5)
nRBC: 0 % (ref 0.0–0.2)

## 2018-10-23 LAB — COMPREHENSIVE METABOLIC PANEL
ALT: 30 U/L (ref 0–44)
AST: 25 U/L (ref 15–41)
Albumin: 2 g/dL — ABNORMAL LOW (ref 3.5–5.0)
Alkaline Phosphatase: 258 U/L — ABNORMAL HIGH (ref 38–126)
Anion gap: 10 (ref 5–15)
BUN: 6 mg/dL (ref 6–20)
CO2: 21 mmol/L — ABNORMAL LOW (ref 22–32)
Calcium: 8.1 mg/dL — ABNORMAL LOW (ref 8.9–10.3)
Chloride: 101 mmol/L (ref 98–111)
Creatinine, Ser: 0.47 mg/dL (ref 0.44–1.00)
GFR calc Af Amer: >60 mL/min (ref >60)
GFR calc non Af Amer: >60 mL/min (ref >60)
Glucose, Bld: 106 mg/dL — ABNORMAL HIGH (ref 70–99)
Potassium: 3.2 mmol/L — ABNORMAL LOW (ref 3.5–5.1)
Sodium: 132 mmol/L — ABNORMAL LOW (ref 135–145)
Total Bilirubin: 0.4 mg/dL (ref 0.3–1.2)
Total Protein: 6.6 g/dL (ref 6.5–8.1)

## 2018-10-23 LAB — RPR: RPR Ser Ql: NONREACTIVE

## 2018-10-23 MED ORDER — CHLORHEXIDINE GLUCONATE CLOTH 2 % EX PADS
6.0000 | MEDICATED_PAD | Freq: Every day | CUTANEOUS | Status: DC
  Administered 2018-10-23 – 2018-11-08 (×14): 6 via TOPICAL

## 2018-10-23 MED ORDER — VANCOMYCIN HCL 10 G IV SOLR
1500.0000 mg | INTRAVENOUS | Status: DC
  Administered 2018-10-23 – 2018-10-25 (×3): 1500 mg via INTRAVENOUS
  Filled 2018-10-23 (×4): qty 1500

## 2018-10-23 MED ORDER — HYDROMORPHONE HCL 1 MG/ML IJ SOLN: 1.0000 mg | Freq: Once | INTRAMUSCULAR | Status: DC | PRN
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
## 2018-10-23 NOTE — Progress Notes (Signed)
  Date: 10/23/2018  Patient name: [name] [name]  Medical record number: [id number]  Date of birth: July 31, 1980        I have seen and evaluated this patient and I have discussed the plan of care with the house staff. Please see their note for complete details. I concur with their findings with the following additions/corrections: [name] [name] was seen this morning on team rounds.  She continues to complain of severe pain.  She was unable to tolerate thoracentesis yesterday as she is in too much pain when sits up.  Dr. Sharon Seller had discussed thoracentesis with IR yesterday who felt that the effusion was too small to tap.  Our concern is that this is loculated and could develop into an empyema.  Although she is on vancomycin, it is unlikely to penetrate the pleural space.  However, if the effusion truly is too tiny to aspirate, it is unlikely to develop into an empyema.  We will continue antibiotics, follow her temperature curve as she is currently afebrile, and her leukocytosis which is downward trending.  ReachOut to IR on Monday the fifth.  Bartholomew Crews, MD 10/23/2018, 3:37 PM

## 2018-10-23 NOTE — Progress Notes (Addendum)
   Subjective:  Patient continues to have significant pain in the left upper quadrant, lower anterior thoracics. She denies SOB but does have some pain with deep breaths. She had a BM this am.   Objective:  Vital signs in last 24 hours: Vitals:   10/22/18 2031 10/23/18 0042 10/23/18 0426 10/23/18 0550  BP: 133/81 126/84 121/79   Pulse: (!) 103 (!) 106 (!) 104 99  Resp: 18 14 16  (!) 29  Temp: 98.6 F (37 C) 98.9 F (37.2 C) 98.6 F (37 C)   TempSrc: Oral Oral Oral   SpO2: 100% 97% 97% 97%  Weight:    87 kg  Height:       Constitution: mild distress, supine in bed  Cardio: tachycardic, no m/r/g  Respiratory: CTAB,  Abdominal: TTP LUQ, soft, non-distended  MSK: moving all extremities Neuro: alert and oriented, tearful affect Skin: c/d/i    Assessment/Plan:  Principal Problem:   Septic embolism (HCC) Active Problems:   Loculated pleural effusion   Spinal epidural abscess   IVDU (intravenous drug user)  38yo female with PMH IVDU with recent relapse this past March, initially presented with b/l LE numbness and found to have epidural abscess. Additionally had moderate loculated pleural effusion on CT. CT abd wnl. Now s/p laminectomy and epidural abscess drainage with neurosurgery on 9/30.   Loculated Pleural Effusion Loculated pleural effusion on CT at admission 9/30. She has had thoracentesis attempted twice now but unable to sit up 2/2 pain. She is on very high dose dilaudid 2/2 to likely high tolerance due to her IVDU. Spoke with IR today who state yesterday her pleural effusion had significantly decreased compared to admission. Additionally this area is complex, and needle placement would be difficult.   - She is on RA and saturating well. Considering effusion is decreasing and she has had difficulty with tolerating procedure due to pain we will hold off for now on thoracentesis - narrowed from flagyl, cefepime, vanc to vanc alone yesterday - leukocytosis similar to  yesterday, consider restarting empiric treatment if no improvement in leukocytosis tomorrow.  - am CBC, CMP  - cont dilaudid 1mg  q3h prn, oxycodone 10 mg q4h prn pain  Thoracic Spine Epidural Abscess s/p drainage and laminectomy Drain removed today per neurosurgery. Wound growing MRSA. Blood cultures have been negative 3 days.  - cont. IV vanc - f/u neurosurgery recommendations  - start pharmacological VTE prophylaxis tomorrow, SCDs one more day - Echo showed no endocarditis, TEE planned for Monday considering IVDU and epidural abscess positive for MRSA  IVDU History of opioid use started for management of chronic low back pain. IV drug use history managed with Suboxone. Relapse 03/2018. Last use 6 hours prior to arrival. RPR and HIV antibody non-reactive.  - Suboxone at discharge    VTE: SCDs IVF: SCDs Diet: HH Code: full   Dispo: Anticipated discharge pending medical improvement.   Marty Heck, DO 10/23/2018, 6:26 AM Pager: [phone]

## 2018-10-23 NOTE — Progress Notes (Signed)
Pharmacy Antibiotic Note  [name] [name] is a 38 y.o. female admitted on 10/20/2018 with concern for epidural abscess and septic emboli. PMH significant for IVDU. 10/1 ECHO negative for vegetations. Thoracentesis planned for 10/2 but pt could not tolerate. TEE planned for 10/5 to r/o endocarditis. Pharmacy has been consulted for Vancomycin dosing.  Afebrile, WBC trending down 16.3. SCr stable at 0.47. Vancomycin regimen was calculated using wt of 82 kg from 10/1; however recorded wt since then have been around 87 kg. Will recalculate dose based on 87 kg.   Plan: Increase vancomycin dose to 1500 mg IV Q 24 hrs. Goal AUC 400-550. Expected AUC: 524.3 SCr used: 0.8 F/U abscess cx susceptibility, renal fxn, and vanc levels at steady state  Height: 5\' 2"  (157.5 cm) Weight: 191 lb 12.8 oz (87 kg) IBW/kg (Calculated) : 50.1  Temp (24hrs), Avg:98.6 F (37 C), Min:98.2 F (36.8 C), Max:98.9 F (37.2 C)  Recent Labs  Lab 10/20/18 0926 10/20/18 0927 10/21/18 0534 10/22/18 0518 10/23/18 0527  WBC 20.3*  --  19.8* 16.8* 16.3*  CREATININE 0.59  --  0.55 0.52 0.47  LATICACIDVEN  --  1.0  --   --   --     Estimated Creatinine Clearance: 95.8 mL/min (by C-G formula based on SCr of 0.47 mg/dL).    Allergies  Allergen Reactions  . Epidural Tray 17gx3-1-2" [Nerve Block Tray] Other (See Comments)    Paralysis and severe pain in head/neck/shoulders.  No anaphylaxis.  . Ibuprofen Hives  . Zofran [Ondansetron Hcl] Nausea And Vomiting  . Penicillins Hives    Has patient had a PCN reaction causing immediate rash, facial/tongue/throat swelling, SOB or lightheadedness with hypotension: Yes Has patient had a PCN reaction causing severe rash involving mucus membranes or skin necrosis: No Has patient had a PCN reaction that required hospitalization No Has patient had a PCN reaction occurring within the last 10 years: No If all of the above answers are "NO", then may proceed with Cephalosporin use.      Antimicrobials this admission: Cefepime 9/30 >> 10/2 Flagyl 10/1 >>10/2 Vancomycin 9/30 >>   Dose adjustments this admission: 10/3 Vanc 1250 IV q24h to 1500 IV q24h  Microbiology results: 9/30 covid - negative 9/30 BCx - ngtd 9/30 thoracic abscess epidural B - SA 9/30 thoracic abscess epidural A - SA 10/1 mrsa pcr - neg    Thank you for allowing pharmacy to be a part of this patient's care.  Berenice Bouton, PharmD PGY1 Pharmacy Resident Office phone: [phone] 10/23/2018 12:12 PM

## 2018-10-23 NOTE — Progress Notes (Signed)
Patient ID: [name] [name], female   DOB: Sep 30, 1980, 38 y.o.   MRN: [id number] Pain seems a little better controlled until she tries to roll in bed.  She is moving her legs well.  Her dressing is dry.  I removed the drain today.  She remains on antibiotics.  She is on vancomycin and growing staph aureus out of her wound cultures.  No new recommendations.  We continue to follow.

## 2018-10-24 DIAGNOSIS — R339 Retention of urine, unspecified: Secondary | ICD-10-CM

## 2018-10-24 LAB — CBC
HCT: 32 % — ABNORMAL LOW (ref 36.0–46.0)
Hemoglobin: 10.8 g/dL — ABNORMAL LOW (ref 12.0–15.0)
MCH: 28.2 pg (ref 26.0–34.0)
MCHC: 33.8 g/dL (ref 30.0–36.0)
MCV: 83.6 fL (ref 80.0–100.0)
Platelets: 708 10*3/uL — ABNORMAL HIGH (ref 150–400)
RBC: 3.83 MIL/uL — ABNORMAL LOW (ref 3.87–5.11)
RDW: 13.9 % (ref 11.5–15.5)
WBC: 19.1 10*3/uL — ABNORMAL HIGH (ref 4.0–10.5)
nRBC: 0.2 % (ref 0.0–0.2)

## 2018-10-24 MED ORDER — KETOROLAC TROMETHAMINE 30 MG/ML IJ SOLN
30.0000 mg | Freq: Once | INTRAMUSCULAR | Status: AC
  Administered 2018-10-24: 30 mg via INTRAVENOUS
  Filled 2018-10-24: qty 1

## 2018-10-24 MED ORDER — SODIUM CHLORIDE 0.9 % IV SOLN
2.0000 g | Freq: Three times a day (TID) | INTRAVENOUS | Status: DC
  Administered 2018-10-24 – 2018-10-25 (×4): 2 g via INTRAVENOUS
  Filled 2018-10-24 (×7): qty 2

## 2018-10-24 MED ORDER — HYDROMORPHONE HCL 1 MG/ML IJ SOLN
0.5000 mg | Freq: Four times a day (QID) | INTRAMUSCULAR | Status: AC | PRN
  Administered 2018-10-24: 0.5 mg via INTRAVENOUS
  Filled 2018-10-24: qty 1

## 2018-10-24 MED ORDER — METRONIDAZOLE IN NACL 5-0.79 MG/ML-% IV SOLN
500.0000 mg | Freq: Three times a day (TID) | INTRAVENOUS | Status: DC
  Administered 2018-10-24 – 2018-10-25 (×4): 500 mg via INTRAVENOUS
  Filled 2018-10-24 (×4): qty 100

## 2018-10-24 MED ORDER — POTASSIUM CHLORIDE CRYS ER 20 MEQ PO TBCR
40.0000 meq | EXTENDED_RELEASE_TABLET | Freq: Two times a day (BID) | ORAL | Status: DC
  Administered 2018-10-24 (×2): 40 meq via ORAL
  Filled 2018-10-24 (×3): qty 2

## 2018-10-24 MED ORDER — KETOROLAC TROMETHAMINE 30 MG/ML IJ SOLN
30.0000 mg | Freq: Four times a day (QID) | INTRAMUSCULAR | Status: DC
  Administered 2018-10-25 – 2018-10-26 (×7): 30 mg via INTRAVENOUS
  Filled 2018-10-24 (×7): qty 1

## 2018-10-24 MED ORDER — HEPARIN SODIUM (PORCINE) 5000 UNIT/ML IJ SOLN
5000.0000 [IU] | Freq: Three times a day (TID) | INTRAMUSCULAR | Status: DC
  Administered 2018-10-24 – 2018-10-26 (×5): 5000 [IU] via SUBCUTANEOUS
  Filled 2018-10-24 (×6): qty 1

## 2018-10-24 MED ORDER — HYDROMORPHONE HCL 1 MG/ML IJ SOLN
1.2500 mg | INTRAMUSCULAR | Status: DC | PRN
  Administered 2018-10-24 – 2018-10-28 (×24): 1.25 mg via INTRAVENOUS
  Filled 2018-10-24 (×25): qty 2

## 2018-10-24 MED ORDER — HYDROMORPHONE HCL 1 MG/ML IJ SOLN
0.5000 mg | Freq: Once | INTRAMUSCULAR | Status: AC
  Administered 2018-10-24: 0.5 mg via INTRAVENOUS
  Filled 2018-10-24: qty 1

## 2018-10-24 NOTE — Plan of Care (Signed)
  Problem: Clinical Measurements: Goal: Respiratory complications will improve Outcome: Progressing   Problem: Nutrition: Goal: Adequate nutrition will be maintained Outcome: Progressing   Problem: Education: Goal: Knowledge of General Education information will improve Description: Including pain rating scale, medication(s)/side effects and non-pharmacologic comfort measures Outcome: Not Progressing   Problem: Health Behavior/Discharge Planning: Goal: Ability to manage health-related needs will improve Outcome: Not Progressing   Problem: Pain Managment: Goal: General experience of comfort will improve Outcome: Not Progressing

## 2018-10-24 NOTE — Progress Notes (Signed)
Administered dilaudid 0.5mg  and then assisted patient to [facility].  Allowed patient to sit on [name] for approximately 20 minutes and she was not able to void.  Complained her back was hurting and asked to go back to bed.  Patient was assisted back to bed.

## 2018-10-24 NOTE — Progress Notes (Signed)
Pt asked multiple staff members to retrieve bag brought from home by significant other Andres Shad who was in the emergency department. Charge RN explained to pt that RN and security would have to go through patient's belongings before giving them to the pt and that no outside drugs or medications were permitted to be brought from home. Pt verbalized understanding and gave permission for RN and security to search belongings. When searching patient's belongings this RN found what appeared to be white powdered substance in eye contact lenses case. RN immediately notified security.
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
## 2018-10-24 NOTE — Progress Notes (Addendum)
Subjective: Patient examined at bed side this morning. Reports left chest pain and back pain. She still has a foley catheter but did have a BM. She thinks she is withdrawing from heroin, she reports fevers, chills and diaphoresis.  Objective: Vital signs in last 24 hours: Vitals:   10/23/18 1713 10/23/18 1802 10/23/18 2236 10/24/18 0533  BP:   119/83   Pulse: (!) 103  (!) 102   Resp: 18 16 15    Temp:   98.8 F (37.1 C)   TempSrc:   Axillary   SpO2: 100%  100%   Weight:   87.4 kg 80.7 kg  Height:       Physical Exam: General: Alert but in mild distress Cardiovascular: S1S2 tachycardic, no m/r/g Respiratory: CTAB. Good work of breathing. Tenderness to auscultation of left lower rib region.  Abdomen: Normoactive bowel sounds, soft and non-distended. No tenderness on palpation of the LUQ.  MSK: Moves both legs. Good sensation.  Neuro: Alert and oriented. Tearful affect.    Lab Results: CBC Latest Ref Rng & Units 10/24/2018 10/23/2018 10/22/2018  WBC 4.0 - 10.5 K/uL 19.1(H) 16.3(H) 16.8(H)  Hemoglobin 12.0 - 15.0 g/dL 10.8(L) 10.7(L) 11.5(L)  Hematocrit 36.0 - 46.0 % 32.0(L) 31.5(L) 35.4(L)  Platelets 150 - 400 K/uL 708(H) 627(H) 524(H)   CMP Latest Ref Rng & Units 10/23/2018 10/22/2018 10/21/2018  Glucose 70 - 99 mg/dL 106(H) 111(H) 194(H)  BUN 6 - 20 mg/dL 6 7 11   Creatinine 0.44 - 1.00 mg/dL 0.47 0.52 0.55  Sodium 135 - 145 mmol/L 132(L) 130(L) 133(L)  Potassium 3.5 - 5.1 mmol/L 3.2(L) 3.5 3.7  Chloride 98 - 111 mmol/L 101 97(L) 99  CO2 22 - 32 mmol/L 21(L) 19(L) 20(L)  Calcium 8.9 - 10.3 mg/dL 8.1(L) 8.3(L) 8.5(L)  Total Protein 6.5 - 8.1 g/dL 6.6 6.8 7.5  Total Bilirubin 0.3 - 1.2 mg/dL 0.4 0.7 1.2  Alkaline Phos 38 - 126 U/L 258(H) 331(H) 406(H)  AST 15 - 41 U/L 25 36 51(H)  ALT 0 - 44 U/L 30 28 32    Studies/Results: IR U/S Chest (10/04): Left pleural effusion smaller in size compared to on arrival.   Assessment/Plan: Principal Problem:   Septic embolism (HCC)  Active Problems:   Pleural effusion   Spinal epidural abscess   IVDU (intravenous drug user)  Loculated pleural effusion: Left side pleuritic chest pain on arrival c/w finding of left loculated pleural effusion on CT chest. Two unsuccessful attempts of thoracentesis due to pain intolerance even with pain medication prior to procedure. Receiving Dilaudid 1 mg q3hr prn and Oxycodone 10mg  q4hr prn for severe pain. Effusion smaller in size according to U/S by IR, likely getting smaller from the course of antibiotics. Effusion still needs to be drained since there is a likelihood that it is an empyema. WBC initially down trending but up-trended from 16.3 to 19.1 today. Antibiotics were narrowed down to just vancomycin yesterday.  -- U/S of effusion today to assess size   -- Routine CBC and CMP -- Thoracentesis likely tomorrow  -- Continue vancomycin -- WBC up-trending so restarting empiric abx tx for empyema with cefepime 2 g and metronidazole 500 mg until thoracentesis yields pleural effusion culture  -- Continue dilaudid 1 mg q3hr and oxycodone 10 mg q4hr for severe pain - prn .5mg  diaudid for PT/OT  Thoracic spine epidural abscess s/p thoracic decompression and laminectomy: Decompression surgery on 9/30 and drain removed on 10/04 by neurosurgery. Wound culture grew MRSA. No growth in 3 days  on blood culture. TEE scheduled for 10/05 to r/o septic emboli from vegetative bacterial endocarditis.   -- Continue vancomycin -- remove foley cath today  -- NPO @ [facility] for TEE tomorrow   IVDU: History of opioid use started for management of chronic low back pain. IV drug use history managed with Suboxone. Relapse 03/2018. Dismissed from [facility] in 08/2018. Last use 6 hours prior to arrival with a probable use during stay at hospital.RPR and HIV antibody non-reactive. -- Suboxone at discharge   Diet: NPO at [facility] for TEE DVT: SCD today, probable switch to heparin 5000 units tomorrow  Dispo: Discharge likely  after 4-6 weeks after completing course of IV abx   This is a Careers information officer Note.  The care of the patient was discussed with Drs. Ambermarie Honeyman and Coe, and the assessment and plan formulated with their assistance.  Please see their attached note for official documentation of the daily encounter.   LOS: 4 days   Evaristo Bury, Medical Student 10/24/2018, 8:38 AM   Attestation for Student Documentation:  I personally was present and performed or re-performed the history, physical exam and medical decision-making activities of this service and have verified that the service and findings are accurately documented in the student's note.  Council Munguia A, DO 10/24/2018, 4:18 PM

## 2018-10-24 NOTE — H&P (View-Only) (Signed)
Performed in and out cath on patient.  Bladder scan showed >600 ml.  Removed 800 ml of urine.  Urine was yellow and clear.  Patient tolerated procedure well.

## 2018-10-24 NOTE — Progress Notes (Signed)
Performed in and out cath on patient.  Bladder scan showed >600 ml.  Removed 800 ml of urine.  Urine was yellow and clear.  Patient tolerated procedure well.

## 2018-10-24 NOTE — Progress Notes (Addendum)
Paged to the patient's room twice in the last 3 hours for pain. I personally evaluated the patients. Upon entering the room, the patient was found sleeping peacefully with normal vitals. When I woke her up to exam her, she began to cry stating she is in significant pain. Patient states that she can have pain on her left side and back.  She states the pain is constant and worse with movement.  She denies associated nausea, vomiting, abdominal pain. When examining the patients surgical site on her back, she experienced more pain with palpation in her lower thoracic and lumbar back than around the surgical site. Patient admits to numbness and tingling in her legs, but does have gross sensation and 5/5 strength in bilateral lower extremities. Patient has had a difficult time voiding on her own since removing the follow catheter. Recent bladder scan showed > 600cc of urine. She denies abdominal pain, but does have mild suprapubic fullness.   Vitals:   10/24/18 1005 10/24/18 1706  BP:  123/79  Pulse: (!) 116 94  Resp: 19 17  Temp:  98.5 F (36.9 C)  SpO2: 100% 100%   Plan: 1. Considering patients opioid use disorder, she has a significantly high tolerance for pain medication. I will increased her scheduled Dilaudid to 1.25 mg q3h 2. Patient has good kidney function - I will order Toradol 30mg  q 6h prn 3. Ordered in and out cath and continue q6 bladder scan. if she continues have bladder retention we will replace the foly cath.  Marianna Payment, D.O. Date 10/24/2018 Time 8:30 PM IMTP, PGY-1 Pager: [phone]

## 2018-10-25 ENCOUNTER — Encounter (HOSPITAL_COMMUNITY)
Admission: EM | Disposition: A | Discharge status: Home / Self Care | Payer: Self-pay | Source: Home / Self Care | Attending: Internal Medicine

## 2018-10-25 ENCOUNTER — Inpatient Hospital Stay: Payer: Self-pay

## 2018-10-25 ENCOUNTER — Inpatient Hospital Stay (HOSPITAL_COMMUNITY): Payer: Medicaid Other

## 2018-10-25 ENCOUNTER — Inpatient Hospital Stay (HOSPITAL_COMMUNITY): Payer: Medicaid Other | Admitting: Anesthesiology

## 2018-10-25 ENCOUNTER — Encounter (HOSPITAL_COMMUNITY): Payer: Self-pay | Admitting: *Deleted

## 2018-10-25 DIAGNOSIS — B9562 Methicillin resistant Staphylococcus aureus infection as the cause of diseases classified elsewhere: Secondary | ICD-10-CM

## 2018-10-25 DIAGNOSIS — R7881 Bacteremia: Secondary | ICD-10-CM

## 2018-10-25 HISTORY — PX: TEE WITHOUT CARDIOVERSION: SHX5443

## 2018-10-25 LAB — COMPREHENSIVE METABOLIC PANEL
ALT: 34 U/L (ref 0–44)
AST: 21 U/L (ref 15–41)
Albumin: 2 g/dL — ABNORMAL LOW (ref 3.5–5.0)
Alkaline Phosphatase: 135 U/L — ABNORMAL HIGH (ref 38–126)
Anion gap: 10 (ref 5–15)
BUN: 7 mg/dL (ref 6–20)
CO2: 19 mmol/L — ABNORMAL LOW (ref 22–32)
Calcium: 8.6 mg/dL — ABNORMAL LOW (ref 8.9–10.3)
Chloride: 107 mmol/L (ref 98–111)
Creatinine, Ser: 0.54 mg/dL (ref 0.44–1.00)
GFR calc Af Amer: >60 mL/min (ref >60)
GFR calc non Af Amer: >60 mL/min (ref >60)
Glucose, Bld: 119 mg/dL — ABNORMAL HIGH (ref 70–99)
Potassium: 4.1 mmol/L (ref 3.5–5.1)
Sodium: 136 mmol/L (ref 135–145)
Total Bilirubin: 1 mg/dL (ref 0.3–1.2)
Total Protein: 6.8 g/dL (ref 6.5–8.1)

## 2018-10-25 LAB — VANCOMYCIN, TROUGH: Vancomycin Tr: <4 ug/mL — ABNORMAL LOW (ref 15–20)

## 2018-10-25 LAB — CBC
HCT: 32.1 % — ABNORMAL LOW (ref 36.0–46.0)
Hemoglobin: 10.9 g/dL — ABNORMAL LOW (ref 12.0–15.0)
MCH: 28.8 pg (ref 26.0–34.0)
MCHC: 34 g/dL (ref 30.0–36.0)
MCV: 84.9 fL (ref 80.0–100.0)
Platelets: 671 10*3/uL — ABNORMAL HIGH (ref 150–400)
RBC: 3.78 MIL/uL — ABNORMAL LOW (ref 3.87–5.11)
RDW: 14 % (ref 11.5–15.5)
WBC: 16.5 10*3/uL — ABNORMAL HIGH (ref 4.0–10.5)
nRBC: 0 % (ref 0.0–0.2)

## 2018-10-25 LAB — CULTURE, BLOOD (ROUTINE X 2): Culture: NO GROWTH

## 2018-10-25 LAB — VANCOMYCIN, PEAK: Vancomycin Pk: <4 ug/mL — ABNORMAL LOW (ref 30–40)

## 2018-10-25 LAB — MAGNESIUM: Magnesium: 2 mg/dL (ref 1.7–2.4)

## 2018-10-25 LAB — GLUCOSE, CAPILLARY
Glucose-Capillary: 114 mg/dL — ABNORMAL HIGH (ref 70–99)
Glucose-Capillary: 235 mg/dL — ABNORMAL HIGH (ref 70–99)

## 2018-10-25 SURGERY — ECHOCARDIOGRAM, TRANSESOPHAGEAL
Anesthesia: Monitor Anesthesia Care

## 2018-10-25 MED ORDER — PHENYLEPHRINE HCL-NACL 10-0.9 MG/250ML-% IV SOLN
INTRAVENOUS | Status: AC
  Filled 2018-10-25: qty 250

## 2018-10-25 MED ORDER — SODIUM CHLORIDE 0.9 % IV SOLN
INTRAVENOUS | Status: DC | PRN
  Administered 2018-10-25: 13:00:00 via INTRAVENOUS

## 2018-10-25 MED ORDER — PROPOFOL 500 MG/50ML IV EMUL
INTRAVENOUS | Status: DC | PRN
  Administered 2018-10-25: 150 ug/kg/min via INTRAVENOUS

## 2018-10-25 MED ORDER — SODIUM CHLORIDE 0.9 % IV SOLN
INTRAVENOUS | Status: DC
  Administered 2018-10-25: 12:00:00 via INTRAVENOUS

## 2018-10-25 MED ORDER — PROPOFOL 10 MG/ML IV BOLUS
INTRAVENOUS | Status: DC | PRN
  Administered 2018-10-25 (×7): 20 mg via INTRAVENOUS

## 2018-10-25 MED ORDER — LIDOCAINE 2% (20 MG/ML) 5 ML SYRINGE
INTRAMUSCULAR | Status: DC | PRN
  Administered 2018-10-25: 40 mg via INTRAVENOUS
  Administered 2018-10-25: 60 mg via INTRAVENOUS

## 2018-10-25 NOTE — Transfer of Care (Signed)
Immediate Anesthesia Transfer of Care Note  Patient: [name] [name]  Procedure(s) Performed: TRANSESOPHAGEAL ECHOCARDIOGRAM (TEE) (N/A )  Patient Location: Endoscopy Unit  Anesthesia Type:MAC  Level of Consciousness: awake, alert  and oriented  Airway & Oxygen Therapy: Patient Spontanous Breathing and Patient connected to nasal cannula oxygen  Post-op Assessment: Report given to RN, Post -op Vital signs reviewed and stable and Patient moving all extremities  Post vital signs: Reviewed and stable  Last Vitals:  Vitals Value Taken Time  BP 122/71 10/25/18 1347  Temp 36.5 C 10/25/18 1347  Pulse 61 10/25/18 1353  Resp 17 10/25/18 1353  SpO2 100 % 10/25/18 1353  Vitals shown include unvalidated device data.  Last Pain:  Vitals:   10/25/18 1347  TempSrc: Temporal  PainSc: 3       Patients Stated Pain Goal: 3 (80/88/11 0315)  Complications: No apparent anesthesia complications

## 2018-10-25 NOTE — Anesthesia Preprocedure Evaluation (Signed)
Anesthesia Evaluation  Patient identified by MRN, date of birth, ID band Patient awake    Reviewed: Allergy & Precautions, H&P , NPO status , Patient's Chart, lab work & pertinent test results  Airway Mallampati: II  TM Distance: >3 FB Neck ROM: full    Dental no notable dental hx.    Pulmonary Current Smoker,    Pulmonary exam normal breath sounds clear to auscultation       Cardiovascular negative cardio ROS Normal cardiovascular exam Rhythm:regular Rate:Normal     Neuro/Psych  Headaches, PSYCHIATRIC DISORDERS Anxiety Depression    GI/Hepatic GERD  ,(+)     substance abuse  cocaine use and IV drug use,   Endo/Other    Renal/GU      Musculoskeletal  (+) Arthritis , narcotic dependent  Abdominal   Peds  Hematology   Anesthesia Other Findings   Reproductive/Obstetrics                             Anesthesia Physical  Anesthesia Plan  ASA: II  Anesthesia Plan: General and MAC   Post-op Pain Management:    Induction: Intravenous  PONV Risk Score and Plan: 1 and Treatment may vary due to age or medical condition and Ondansetron  Airway Management Planned: Nasal Cannula  Additional Equipment:   Intra-op Plan:   Post-operative Plan:   Informed Consent: I have reviewed the patients History and Physical, chart, labs and discussed the procedure including the risks, benefits and alternatives for the proposed anesthesia with the patient or authorized representative who has indicated his/her understanding and acceptance.       Plan Discussed with: CRNA, Anesthesiologist and Surgeon  Anesthesia Plan Comments:         Anesthesia Quick Evaluation

## 2018-10-25 NOTE — Interval H&P Note (Signed)
History and Physical Interval Note:  10/25/2018 12:15 PM  [name] [name]  has presented today for surgery, with the diagnosis of BACTEREMIA.  The various methods of treatment have been discussed with the patient and family. After consideration of risks, benefits and other options for treatment, the patient has consented to  Procedure(s): TRANSESOPHAGEAL ECHOCARDIOGRAM (TEE) (N/A) as a surgical intervention.  The patient's history has been reviewed, patient examined, no change in status, stable for surgery.  I have reviewed the patient's chart and labs.  Questions were answered to the patient's satisfaction.     Jenkins Rouge

## 2018-10-25 NOTE — Progress Notes (Signed)
PT Cancellation Note  Patient Details Name: [name] [name] MRN: [id number] DOB: Jun 01, 1980   Cancelled Treatment:    Reason Eval/Treat Not Completed: Patient at procedure or test/unavailable   Shary Decamp [facility] 10/25/2018, 1:03 PM [place] Pager [phone] Office [phone]

## 2018-10-25 NOTE — Progress Notes (Signed)
Subjective: Patient examined at bed side this morning. Patient states the her recent increase in pain medications has help. She denies shortness of breath or chest pain. States that her numbness and tingling in her legs has not improved or worsened. Still has good strength.   Objective: Vital signs in last 24 hours: Vitals:   10/25/18 1347 10/25/18 1350 10/25/18 1400 10/25/18 1407  BP: 122/71 137/87  139/89  Pulse: 77 71  78  Resp: 17 18 (!) 24 (!) 21  Temp: 97.7 F (36.5 C)     TempSrc: Temporal     SpO2: 100% 99% 100% 100%  Weight:      Height:       Physical Exam: Physical Exam  Constitutional: She is oriented to person, place, and time. No distress.  HENT:  Head: Atraumatic.  Eyes: EOM are normal.  Neck: Normal range of motion.  Cardiovascular: Normal rate, regular rhythm and intact distal pulses. Exam reveals no gallop and no friction rub.  No murmur heard. Pulmonary/Chest: Effort normal and breath sounds normal.  Abdominal: Soft. She exhibits no distension.  Musculoskeletal: Normal range of motion.  Neurological: She is alert and oriented to person, place, and time.  Skin: Skin is warm and dry.   Lab Results: CMP Latest Ref Rng & Units 10/25/2018 10/23/2018 10/22/2018  Glucose 70 - 99 mg/dL 119(H) 106(H) 111(H)  BUN 6 - 20 mg/dL 7 6 7   Creatinine 0.44 - 1.00 mg/dL 0.54 0.47 0.52  Sodium 135 - 145 mmol/L 136 132(L) 130(L)  Potassium 3.5 - 5.1 mmol/L 4.1 3.2(L) 3.5  Chloride 98 - 111 mmol/L 107 101 97(L)  CO2 22 - 32 mmol/L 19(L) 21(L) 19(L)  Calcium 8.9 - 10.3 mg/dL 8.6(L) 8.1(L) 8.3(L)  Total Protein 6.5 - 8.1 g/dL 6.8 6.6 6.8  Total Bilirubin 0.3 - 1.2 mg/dL 1.0 0.4 0.7  Alkaline Phos 38 - 126 U/L 135(H) 258(H) 331(H)  AST 15 - 41 U/L 21 25 36  ALT 0 - 44 U/L 34 30 28   CBC Latest Ref Rng & Units 10/25/2018 10/24/2018 10/23/2018  WBC 4.0 - 10.5 K/uL 16.5(H) 19.1(H) 16.3(H)  Hemoglobin 12.0 - 15.0 g/dL 10.9(L) 10.8(L) 10.7(L)  Hematocrit 36.0 - 46.0 % 32.1(L)  32.0(L) 31.5(L)  Platelets 150 - 400 K/uL 671(H) 708(H) 627(H)   Micro Results: Wound culture: Grew MRSA Blood culture: No growth in 5 days.   Studies/Results: TEE: Normal LV function with LVEF of 60-65 %. No vegetation on TV, AV and MV.   Assessment/Plan: Principal Problem:   Septic embolism (HCC) Active Problems:   Loculated pleural effusion   Spinal epidural abscess   IVDU (intravenous drug user)   Urinary retention  [name] [name] is a 38 years old female with pmhx of anxiety, DDD and IVDU who presented with left pleuritic chest pain c/w loculated left lung effusion and bilateral leg weakness and numbness form a thoracic epidural abscess s/p decompression and laminectomy on 9/30.   Loculated pleural effusion: Left sided pleuritic chest pain on arrival c/w finding of left loculated pleural effusion on CT chest. Effusion smaller in size when compared to arrival so IR had concern about needle placement for drainage. Will get chest X-ray to evaluate size of effusion. If unable to determine the change in pleural effusion, we will repeat CT scan. Depending on the result we will consult IR vs CT surgery for source control of the likely empyema. No evidence of vegetation on heart valves on TEE so antibiotics were narrowed from cefepime+metronidazole+vancomycin to just  vancomycin. WBC down-trending today. Spike likely from the dexamethasone dose for surgery.  -- f/u with CXR -- cont vanc  -- Continue dilaudid 0.5 mg q3hr and oxycodone 10 mg q4hr for severe pain and Toradol 30 mg  -- Consider PCA pump tomorrow   Thoracic spine epidural abscess s/p thoracic decompression and laminectomy: Decompression surgery on 9/30 and drain removed on 10/04 by neurosurgery. Wound culture grew MRSA. No growth in 5 days on blood culture. TEE w/o evidence for vegetative bacterial endocarditis. Narrowing antibiotics down to just vancomycin. Retaining urine concerning for neurogenic bladder.  -- Pain control with  dilaudid 0.5 mg q3hr and oxycodone 10 mg q4hr for severe pain and Toradol 30 mg -- cont vanc -- Urology consult before discharge  IVDU: History of opioid use started for management of chronic low back pain. IV drug use history managed with Suboxone. Relapse 03/2018. Dismissed from [facility] in 08/2018. Last use 6 hours prior to arrival with a probable use during stay at hospital.RPR and HIV antibody non-reactive. --Suboxone at discharge  This is a Careers information officer Note.  The care of the patient was discussed with Dr. Marianna Payment and the assessment and plan formulated with their assistance.  Please see their attached note for official documentation of the daily encounter.   LOS: 5 days   Evaristo Bury, Medical Student 10/25/2018, 2:51 PM

## 2018-10-25 NOTE — Anesthesia Postprocedure Evaluation (Signed)
Anesthesia Post Note  Patient: [name] [name]  Procedure(s) Performed: TRANSESOPHAGEAL ECHOCARDIOGRAM (TEE) (N/A )     Patient location during evaluation: Endoscopy Anesthesia Type: MAC Level of consciousness: awake and alert Pain management: pain level controlled Vital Signs Assessment: post-procedure vital signs reviewed and stable Respiratory status: spontaneous breathing, nonlabored ventilation and respiratory function stable Cardiovascular status: stable and blood pressure returned to baseline Postop Assessment: no apparent nausea or vomiting Anesthetic complications: no    Last Vitals:  Vitals:   10/25/18 1400 10/25/18 1407  BP:  139/89  Pulse:  78  Resp: (!) 24 (!) 21  Temp:    SpO2: 100% 100%    Last Pain:  Vitals:   10/25/18 1407  TempSrc:   PainSc: [name]

## 2018-10-25 NOTE — CV Procedure (Signed)
TEE: Anesthesia: Propofol Procedure delayed due to lack of adequate iv Eventually started by anesthesia with [place]  Normal TEE Trivial MR No SBE/Vegetations  Jenkins Rouge

## 2018-10-25 NOTE — Progress Notes (Signed)
Pt adamantly refusing vanc peak lab. Pt instructed on importance of lab draw. Notified pharmacy.

## 2018-10-25 NOTE — Progress Notes (Signed)
  Echocardiogram Echocardiogram Transesophageal has been performed.  [name] [name] 10/25/2018, 1:53 PM

## 2018-10-26 ENCOUNTER — Encounter (HOSPITAL_COMMUNITY): Payer: Self-pay | Admitting: Cardiovascular Disease

## 2018-10-26 DIAGNOSIS — Z95828 Presence of other vascular implants and grafts: Secondary | ICD-10-CM

## 2018-10-26 LAB — COMPREHENSIVE METABOLIC PANEL
ALT: 28 U/L (ref 0–44)
AST: 16 U/L (ref 15–41)
Albumin: 2 g/dL — ABNORMAL LOW (ref 3.5–5.0)
Alkaline Phosphatase: 128 U/L — ABNORMAL HIGH (ref 38–126)
Anion gap: 11 (ref 5–15)
BUN: 6 mg/dL (ref 6–20)
CO2: 23 mmol/L (ref 22–32)
Calcium: 8.4 mg/dL — ABNORMAL LOW (ref 8.9–10.3)
Chloride: 102 mmol/L (ref 98–111)
Creatinine, Ser: 0.5 mg/dL (ref 0.44–1.00)
GFR calc Af Amer: >60 mL/min (ref >60)
GFR calc non Af Amer: >60 mL/min (ref >60)
Glucose, Bld: 197 mg/dL — ABNORMAL HIGH (ref 70–99)
Potassium: 3.3 mmol/L — ABNORMAL LOW (ref 3.5–5.1)
Sodium: 136 mmol/L (ref 135–145)
Total Bilirubin: 0.4 mg/dL (ref 0.3–1.2)
Total Protein: 6.6 g/dL (ref 6.5–8.1)

## 2018-10-26 LAB — AEROBIC/ANAEROBIC CULTURE W GRAM STAIN (SURGICAL/DEEP WOUND)

## 2018-10-26 LAB — CBC
HCT: 31 % — ABNORMAL LOW (ref 36.0–46.0)
Hemoglobin: 10.4 g/dL — ABNORMAL LOW (ref 12.0–15.0)
MCH: 28.7 pg (ref 26.0–34.0)
MCHC: 33.5 g/dL (ref 30.0–36.0)
MCV: 85.6 fL (ref 80.0–100.0)
Platelets: 667 10*3/uL — ABNORMAL HIGH (ref 150–400)
RBC: 3.62 MIL/uL — ABNORMAL LOW (ref 3.87–5.11)
RDW: 14 % (ref 11.5–15.5)
WBC: 14.2 10*3/uL — ABNORMAL HIGH (ref 4.0–10.5)
nRBC: 0 % (ref 0.0–0.2)

## 2018-10-26 MED ORDER — DIPHENHYDRAMINE HCL 25 MG PO CAPS
25.0000 mg | ORAL_CAPSULE | Freq: Once | ORAL | Status: AC
  Administered 2018-10-26: 25 mg via ORAL
  Filled 2018-10-26: qty 1

## 2018-10-26 MED ORDER — POTASSIUM CHLORIDE 20 MEQ PO PACK
40.0000 meq | PACK | ORAL | Status: DC
  Filled 2018-10-26 (×2): qty 2

## 2018-10-26 MED ORDER — SODIUM CHLORIDE 0.9% FLUSH
10.0000 mL | INTRAVENOUS | Status: DC | PRN
  Administered 2018-10-27: 10 mL
  Filled 2018-10-26: qty 40

## 2018-10-26 MED ORDER — POTASSIUM CHLORIDE CRYS ER 20 MEQ PO TBCR
40.0000 meq | EXTENDED_RELEASE_TABLET | Freq: Once | ORAL | Status: DC
  Filled 2018-10-26: qty 2

## 2018-10-26 MED ORDER — HYDROMORPHONE HCL 1 MG/ML IJ SOLN: 0.2500 mg | Freq: Once | INTRAMUSCULAR | Status: DC | PRN

## 2018-10-26 MED ORDER — SODIUM CHLORIDE 0.9% FLUSH
10.0000 mL | Freq: Two times a day (BID) | INTRAVENOUS | Status: DC
  Administered 2018-10-26 – 2018-11-08 (×25): 10 mL

## 2018-10-26 MED ORDER — VANCOMYCIN HCL 10 G IV SOLR
1500.0000 mg | Freq: Two times a day (BID) | INTRAVENOUS | Status: DC
  Administered 2018-10-26 – 2018-11-06 (×23): 1500 mg via INTRAVENOUS
  Filled 2018-10-26 (×24): qty 1500

## 2018-10-26 MED ORDER — PHENYLEPHRINE HCL-NACL 10-0.9 MG/250ML-% IV SOLN
INTRAVENOUS | Status: AC
  Filled 2018-10-26: qty 250

## 2018-10-26 MED ORDER — DIPHENHYDRAMINE-ZINC ACETATE 2-0.1 % EX CREA
1.0000 "application " | TOPICAL_CREAM | Freq: Three times a day (TID) | CUTANEOUS | Status: DC | PRN
  Administered 2018-10-26 – 2018-11-05 (×5): 1 via TOPICAL
  Filled 2018-10-26 (×3): qty 28

## 2018-10-26 MED ORDER — POTASSIUM CHLORIDE 10 MEQ/100ML IV SOLN
10.0000 meq | INTRAVENOUS | Status: AC
  Administered 2018-10-26 – 2018-10-27 (×6): 10 meq via INTRAVENOUS
  Filled 2018-10-26 (×6): qty 100

## 2018-10-26 MED ORDER — ENOXAPARIN SODIUM 40 MG/0.4ML ~~LOC~~ SOLN
40.0000 mg | SUBCUTANEOUS | Status: DC
  Administered 2018-10-26 – 2018-11-07 (×13): 40 mg via SUBCUTANEOUS
  Filled 2018-10-26 (×13): qty 0.4

## 2018-10-26 NOTE — Progress Notes (Signed)
Pt refusing am labs

## 2018-10-26 NOTE — Progress Notes (Addendum)
Pharmacy Antibiotic Note  [name] [name] is a 38 y.o. female admitted on 10/20/2018 with concern for epidural abscess and septic emboli. PMH significant for IVDU. 10/1 ECHO negative for vegetations. Thoracentesis planned for 10/2 but pt could not tolerate. TEE 10/5 negative. Pharmacy has been consulted for Vancomycin dosing.  Afebrile, WBC trending down 16.5. SCr stable at 0.54.   [place] drawn on 10/5 was <4  Vancomycin calculation: Goal AUC: 400-550 TBW used for Vd, CrCl & Ke Vd coef: 0.5 L/kg SCr used: 0.54 mg/dL  Plan: Increase vancomycin dose to 1500 mg IV every 12 hours, exp. AUC of 449. Monitor renal fxn, and vanc levels at steady state  Height: 5\' 2"  (157.5 cm) Weight: 186 lb 8.2 oz (84.6 kg) IBW/kg (Calculated) : 50.1  Temp (24hrs), Avg:98.5 F (36.9 C), Min:97.7 F (36.5 C), Max:99.1 F (37.3 C)  Recent Labs  Lab 10/20/18 0926 10/20/18 0927 10/21/18 0534 10/22/18 0518 10/23/18 0527 10/24/18 0717 10/25/18 0744 10/25/18 1622 10/25/18 1806  WBC 20.3*  --  19.8* 16.8* 16.3* 19.1* 16.5*  --   --   CREATININE 0.59  --  0.55 0.52 0.47  --  0.54  --   --   LATICACIDVEN  --  1.0  --   --   --   --   --   --   --   VANCOTROUGH  --   --   --   --   --   --   --  <4*  --   VANCOPEAK  --   --   --   --   --   --   --   --  <4*    Estimated Creatinine Clearance: 94.3 mL/min (by C-G formula based on SCr of 0.54 mg/dL).    Allergies  Allergen Reactions  . Epidural Tray [id number]" [Nerve Block Tray] Other (See Comments)    Paralysis and severe pain in head/neck/shoulders.  No anaphylaxis.  . Ibuprofen Hives  . Zofran [Ondansetron Hcl] Nausea And Vomiting  . Penicillins Hives    Has patient had a PCN reaction causing immediate rash, facial/tongue/throat swelling, SOB or lightheadedness with hypotension: Yes Has patient had a PCN reaction causing severe rash involving mucus membranes or skin necrosis: No Has patient had a PCN reaction that required hospitalization No Has  patient had a PCN reaction occurring within the last 10 years: No If all of the above answers are "NO", then may proceed with Cephalosporin use.     Antimicrobials this admission: Cefepime 9/30 >> 10/2 10/4>>10/5 Flagyl 10/1 >>10/2 10/4>>10/5 Vancomycin 9/30 >>   Dose adjustments this admission: 10/6 Vanc 1500 mg IV Q24H changed to 1500 mg IV every 12 hours.  Microbiology results: 9/30 covid - negative 9/30 BCx - ngtd 9/30 thoracic abscess epidural B - MRSA 9/30 thoracic abscess epidural A - MRSA 10/1 mrsa pcr - neg    Thank you for allowing pharmacy to participate in this patient's care.  Mimi L. Devin Going, PharmD, [place] PGY1 Pharmacy Resident 10/26/18      12:10 PM  Please check AMION for all [place] phone numbers After 10:00 PM, call the [place] [phone]

## 2018-10-26 NOTE — Progress Notes (Signed)
Patient ID: [name] [name], female   DOB: 06-05-1980, 37 y.o.   MRN: [id number] Seems more comfortable, moving legs well, still c/o numbness in legs, on vanco. Following.

## 2018-10-26 NOTE — Progress Notes (Addendum)
Subjective: Patient states that she is starting to feel better. Continues to have pain in her back and left flank. She got a PICC line this morning during rounds. Overnight episode of refusing lab draw due to constant sticks and she is a hard stick. Continues to have numbess and tingling in legs. Leg strength is good. Patient still unable to void on her own.    Objective: Vital signs in last 24 hours: Vitals:   10/25/18 1407 10/25/18 1740 10/25/18 2351 10/26/18 0748  BP: 139/89 (!) 125/94 115/71 127/80  Pulse: 78 83 88 85  Resp: (!) 21 16 20 18   Temp:  98.3 F (36.8 C) 99 F (37.2 C) 99.1 F (37.3 C)  TempSrc:  Oral Oral Oral  SpO2: 100% 97% 100% 96%  Weight:      Height:       Physical exam: Physical Exam  Constitutional: She is oriented to person, place, and time. No distress.  HENT:  Head: Atraumatic.  Eyes: EOM are normal.  Neck: Normal range of motion.  Cardiovascular: Normal rate, regular rhythm, normal heart sounds and intact distal pulses. Exam reveals no gallop and no friction rub.  No murmur heard. Pulmonary/Chest: Effort normal and breath sounds normal. No respiratory distress.  Abdominal: Soft. She exhibits no distension. There is no abdominal tenderness.  Musculoskeletal: Normal range of motion.     Comments: Left flank pain.   Neurological: She is alert and oriented to person, place, and time.  Skin: She is not diaphoretic.     Lab Results:  CBC Latest Ref Rng & Units 10/25/2018 10/24/2018 10/23/2018  WBC 4.0 - 10.5 K/uL 16.5(H) 19.1(H) 16.3(H)  Hemoglobin 12.0 - 15.0 g/dL 10.9(L) 10.8(L) 10.7(L)  Hematocrit 36.0 - 46.0 % 32.1(L) 32.0(L) 31.5(L)  Platelets 150 - 400 K/uL 671(H) 708(H) 627(H)   CMP Latest Ref Rng & Units 10/25/2018 10/23/2018 10/22/2018  Glucose 70 - 99 mg/dL 119(H) 106(H) 111(H)  BUN 6 - 20 mg/dL 7 6 7   Creatinine 0.44 - 1.00 mg/dL 0.54 0.47 0.52  Sodium 135 - 145 mmol/L 136 132(L) 130(L)  Potassium 3.5 - 5.1 mmol/L 4.1 3.2(L) 3.5   Chloride 98 - 111 mmol/L 107 101 97(L)  CO2 22 - 32 mmol/L 19(L) 21(L) 19(L)  Calcium 8.9 - 10.3 mg/dL 8.6(L) 8.1(L) 8.3(L)  Total Protein 6.5 - 8.1 g/dL 6.8 6.6 6.8  Total Bilirubin 0.3 - 1.2 mg/dL 1.0 0.4 0.7  Alkaline Phos 38 - 126 U/L 135(H) 258(H) 331(H)  AST 15 - 41 U/L 21 25 36  ALT 0 - 44 U/L 34 30 28    Micro Results: Wound culture: Grew MRSA Blood culture: No growth in 5 days.   Studies/Results: CXR: Retrocardiac streaky opacity as on the prior exam which could be due to atelectasis and/or atypical infectious etiology.   Assessment/Plan: Principal Problem:   Septic embolism (HCC) Active Problems:   Loculated pleural effusion   Spinal epidural abscess   IVDU (intravenous drug user)   Urinary retention   [name] [name] is a 38 year old female with pmhx of DDD, IVDU and anxiety who presented with left sided pleuritic chest pain likely from her left lobe loculated effusion and bilateral leg weakness from thoracic epidural abscess.   Loculated pleural effusion: Patient's leukocytosis appears to be resolving. Recent chest XR shows effusion has decreased since admission. Holding off on IR thoracentesis as patient could not tolerate the procedure due to pain. We will continue to hold off on further management of the effusion in the  setting of clinical improvement.  - Continue IV vancomycin. She will likely need IV antibiotics for 6 weeks per ID - PICC line placed today  Thoracic spine epidural abscess s/p thoracic decompression and laminectomy:  -- Pain control with dilaudid 1.25 mg q3hr and oxycodone 10 mg q4hr for severe painand Toradol 30 mg, will likely need to give extra dose of dilaudid.  Urinary retention: - Patient has failed voiding trial and will likely need urology consult.   Diet: Heart-healthy DVT: Enoxaparin 40 mg Dispo: Discharge likely in 4-6 weeks after completion of her IV vancomycin course   [name] [name], D.O. Date 10/26/2018 Time 5:43 PM IMTP, PGY-1  Pager: [phone]

## 2018-10-26 NOTE — Progress Notes (Signed)
Paged to bedside for evaluation of rash. New since this morning. Pruritic. PE significant for raised erythematous maculopapular rash of thoracic region of back involving skin overlying wound site. Some dry skin peeling. No drainage. See media tab for uploaded photo.   Medications reviewed. Is noted to have hx of allergy to ibuprofen causing hives. Pt is currently on toradol.  Plan: d/c toradol. Can order anti-itch cream if symptoms become bothersome. RN instructed to notify us of progression of rash.

## 2018-10-26 NOTE — Progress Notes (Signed)
  Date: 10/26/2018  Patient name: [name] [name]  Medical record number: [id number]  Date of birth: 10-Apr-1980        I have seen and evaluated this patient and I have discussed the plan of care with the house staff. Please see Dr. Sammie Bench note for complete details. I concur with his findings and plan.   Sid Falcon, MD 10/26/2018, 7:56 PM
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
## 2018-10-26 NOTE — Progress Notes (Signed)
Peripherally Inserted Central Catheter/Midline Placement  The IV Nurse has discussed with the patient and/or persons authorized to consent for the patient, the purpose of this procedure and the potential benefits and risks involved with this procedure.  The benefits include less needle sticks, lab draws from the catheter, and the patient may be discharged home with the catheter. Risks include, but not limited to, infection, bleeding, blood clot (thrombus formation), and puncture of an artery; nerve damage and irregular heartbeat and possibility to perform a PICC exchange if needed/ordered by physician.  Alternatives to this procedure were also discussed.  Bard Power PICC patient education guide, fact sheet on infection prevention and patient information card has been provided to patient /or left at bedside.  Noted harden area about 1 inch below insertion site.  PICC/Midline Placement Documentation  PICC Single Lumen 10/26/18 PICC Right Brachial 35 cm 0 cm (Active)  Indication for Insertion or Continuance of Line Home intravenous therapies (PICC only) 10/26/18 1127  Exposed Catheter (cm) 0 cm 10/26/18 1127  Site Assessment Clean;Dry;Intact 10/26/18 1127  Line Status Flushed;Saline locked;Blood return noted 10/26/18 1127  Dressing Type Transparent;Other (Comment) 10/26/18 1127  Dressing Status Clean;Dry;Intact;Antimicrobial disc in place 10/26/18 1127  Dressing Intervention New dressing 10/26/18 1127  Dressing Change Due 11/02/18 10/26/18 1127       Gordan Payment 10/26/2018, 11:28 AM

## 2018-10-27 DIAGNOSIS — Z960 Presence of urogenital implants: Secondary | ICD-10-CM

## 2018-10-27 DIAGNOSIS — L743 Miliaria, unspecified: Secondary | ICD-10-CM

## 2018-10-27 DIAGNOSIS — F1990 Other psychoactive substance use, unspecified, uncomplicated: Secondary | ICD-10-CM

## 2018-10-27 LAB — CBC
HCT: 31.9 % — ABNORMAL LOW (ref 36.0–46.0)
Hemoglobin: 10.2 g/dL — ABNORMAL LOW (ref 12.0–15.0)
MCH: 27.8 pg (ref 26.0–34.0)
MCHC: 32 g/dL (ref 30.0–36.0)
MCV: 86.9 fL (ref 80.0–100.0)
Platelets: 643 10*3/uL — ABNORMAL HIGH (ref 150–400)
RBC: 3.67 MIL/uL — ABNORMAL LOW (ref 3.87–5.11)
RDW: 13.7 % (ref 11.5–15.5)
WBC: 14.9 10*3/uL — ABNORMAL HIGH (ref 4.0–10.5)
nRBC: 0 % (ref 0.0–0.2)

## 2018-10-27 LAB — BASIC METABOLIC PANEL
Anion gap: 11 (ref 5–15)
BUN: <5 mg/dL — ABNORMAL LOW (ref 6–20)
CO2: 23 mmol/L (ref 22–32)
Calcium: 6.5 mg/dL — ABNORMAL LOW (ref 8.9–10.3)
Chloride: 100 mmol/L (ref 98–111)
Creatinine, Ser: 0.59 mg/dL (ref 0.44–1.00)
GFR calc Af Amer: >60 mL/min (ref >60)
GFR calc non Af Amer: >60 mL/min (ref >60)
Glucose, Bld: 199 mg/dL — ABNORMAL HIGH (ref 70–99)
Potassium: 4.9 mmol/L (ref 3.5–5.1)
Sodium: 134 mmol/L — ABNORMAL LOW (ref 135–145)

## 2018-10-27 MED ORDER — DIPHENHYDRAMINE HCL 25 MG PO CAPS
25.0000 mg | ORAL_CAPSULE | Freq: Once | ORAL | Status: AC
  Administered 2018-10-27: 25 mg via ORAL
  Filled 2018-10-27: qty 1

## 2018-10-27 MED ORDER — HYDROXYZINE HCL 25 MG PO TABS
25.0000 mg | ORAL_TABLET | Freq: Three times a day (TID) | ORAL | Status: DC | PRN
  Administered 2018-10-28 – 2018-11-08 (×13): 25 mg via ORAL
  Filled 2018-10-27 (×14): qty 1

## 2018-10-27 MED ORDER — HYDROXYZINE HCL 25 MG PO TABS: 25.0000 mg | ORAL_TABLET | Freq: Three times a day (TID) | ORAL | Status: DC | PRN

## 2018-10-27 MED ORDER — PHENYLEPHRINE HCL-NACL 10-0.9 MG/250ML-% IV SOLN
INTRAVENOUS | Status: AC
  Filled 2018-10-27: qty 500

## 2018-10-27 MED ORDER — HYDROXYZINE HCL 25 MG PO TABS
25.0000 mg | ORAL_TABLET | Freq: Three times a day (TID) | ORAL | Status: DC
  Administered 2018-10-27: 25 mg via ORAL
  Filled 2018-10-27: qty 1

## 2018-10-27 MED ORDER — PROPOFOL 1000 MG/100ML IV EMUL
INTRAVENOUS | Status: AC
  Filled 2018-10-27: qty 200

## 2018-10-27 MED ORDER — ALUM & MAG HYDROXIDE-SIMETH 200-200-20 MG/5ML PO SUSP
15.0000 mL | Freq: Four times a day (QID) | ORAL | Status: DC | PRN
  Filled 2018-10-27: qty 30

## 2018-10-27 MED ORDER — PHENYLEPHRINE HCL-NACL 10-0.9 MG/250ML-% IV SOLN
INTRAVENOUS | Status: AC
  Filled 2018-10-27: qty 250

## 2018-10-27 MED ORDER — OXYCODONE HCL 5 MG PO TABS
15.0000 mg | ORAL_TABLET | Freq: Four times a day (QID) | ORAL | Status: DC | PRN
  Administered 2018-10-27 – 2018-10-29 (×6): 15 mg via ORAL
  Filled 2018-10-27 (×8): qty 3

## 2018-10-27 NOTE — Progress Notes (Signed)
Pt making a lot of frequent calls to nurse's station, and frequent calls for pain medication. Pt apologized for calling so much stating "I had to walk to the door with PT today"

## 2018-10-27 NOTE — Progress Notes (Addendum)
Subjective: Patient appears to be clinically improving. She continues to have pruritis form her rash over her back. Diphenhydramine seems to improve her symptoms. She denies chest pain, shortness of breath, weakness.  She continues to have stable paresthesias over her legs.  Objective:  Vital signs in last 24 hours: Vitals:   10/26/18 1659 10/26/18 2251 10/27/18 0350 10/27/18 0730  BP: 137/76 (!) 139/57  131/81  Pulse: 92 93  (!) 106  Resp: 18 (!) 21    Temp: 98.9 F (37.2 C) 98.6 F (37 C)  98.6 F (37 C)  TempSrc: Oral   Oral  SpO2: 98% 100%  100%  Weight:   85 kg   Height:       Physical Exam  Constitutional: She is oriented to person, place, and time. No distress.  Eyes: EOM are normal.  Neck: Normal range of motion.  Cardiovascular: Normal rate, regular rhythm, normal heart sounds and intact distal pulses. Exam reveals no gallop and no friction rub.  No murmur heard. Pulmonary/Chest: Effort normal and breath sounds normal.  Abdominal: Soft. Bowel sounds are normal. She exhibits no distension. There is abdominal tenderness (left flank - pain is stable).  Musculoskeletal: Normal range of motion.  Neurological: She is alert and oriented to person, place, and time.  Numbness and tingling in legs persists.  Skin: Skin is warm and dry. She is not diaphoretic.  Rash - patient has a maculopapular rash distributed over her back.See media for image     Anti-microbials: 1. Vancomycin 1,500 mg BID (10/03 - Continued) 2. Cefepime 2 g IV (10/04 - 10/05) 4. Metronidazole 500 mg IV (10/04 - 10/05)  Cultures: 1. Blood cultures: No growth at day 2 days 2. Epidural abscess culture: MRSA  Pertinent labs/Imaging:  CBC    Component Value Date/Time   WBC 14.9 (H) 10/27/2018 0744   RBC 3.67 (L) 10/27/2018 0744   HGB 10.2 (L) 10/27/2018 0744   HCT 31.9 (L) 10/27/2018 0744   PLT 643 (H) 10/27/2018 0744   MCV 86.9 10/27/2018 0744   MCH 27.8 10/27/2018 0744   MCHC 32.0 10/27/2018  0744   RDW 13.7 10/27/2018 0744   LYMPHSABS 2.9 10/20/2018 0926   MONOABS 1.0 10/20/2018 0926   EOSABS 0.2 10/20/2018 0926   BASOSABS 0.0 10/20/2018 0926     Patient Summary:  10/07: Patient clinically improving.    Assessment/Plan:  Principal Problem:   Septic embolism (HCC) Active Problems:   Loculated pleural effusion   Spinal epidural abscess   IVDU (intravenous drug user)   Urinary retention   #Loculated pleural effusion:  - Continue IV vancomycin. - Patient will not be able to get outpatient placement for IV antibiotics.  #Thoracic spine epidural abscess s/p thoracic decompression and laminectomy. - Continue vancomycin IV for 2 weeks (end date Friday 10/16) and then transition to [place] for a month. - Pain management: 1.25 mg Dilaudid every 3 hours, 10 mg oxycodone every 6 hours. We will start to wean her IV pain medication pending discharge after another week of IV antibiotics. - Leukocytosis is trending downward. Currently 14.9. We will keep trending WBC.   #Urinary retention:   -Patient has a Foley catheter at this time. She will likely need urology follow-up as an outpatient.  #[name]: - Hydroxyzine 25 mg TID prn for pruritis. - Ddx: possible drug reaction to Toradol. Medication discontinued.   Diet: Heart healthy IVF: None VTE: Enoxaparin 40 mg Code: Full  Dispo: Anticipate discharge after finishing 2-week course  of IV vancomycin and pending clinical improvement from her MRSA epidural abscess and loculated pleural effusion.  Marianna Payment, MD 10/27/2018, 12:31 PM Pager: [phone]
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
## 2018-10-27 NOTE — Progress Notes (Signed)
Pt requesting to order [employer]. Pt instructed that visiting hours are over because of Covid restrictions. Then pt asked if she could have a visitor. Pt again reminded visiting hours are over.

## 2018-10-27 NOTE — Progress Notes (Signed)
Area of induration with accompanying reddness noted at site of incision on upper back.  This was marked off with Sharpie.  Notified today's rounding MD.  Awaiting return call.
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
## 2018-10-28 ENCOUNTER — Inpatient Hospital Stay (HOSPITAL_COMMUNITY): Payer: Medicaid Other

## 2018-10-28 ENCOUNTER — Encounter (HOSPITAL_COMMUNITY): Payer: Self-pay | Admitting: Student

## 2018-10-28 HISTORY — PX: IR THORACENTESIS ASP PLEURAL SPACE W/IMG GUIDE: IMG5380

## 2018-10-28 LAB — HEMOGLOBIN A1C
Hgb A1c MFr Bld: 5.9 % — ABNORMAL HIGH (ref 4.8–5.6)
Mean Plasma Glucose: 122.63 mg/dL

## 2018-10-28 LAB — BASIC METABOLIC PANEL
Anion gap: 10 (ref 5–15)
BUN: 5 mg/dL — ABNORMAL LOW (ref 6–20)
CO2: 26 mmol/L (ref 22–32)
Calcium: 8.7 mg/dL — ABNORMAL LOW (ref 8.9–10.3)
Chloride: 97 mmol/L — ABNORMAL LOW (ref 98–111)
Creatinine, Ser: 0.6 mg/dL (ref 0.44–1.00)
GFR calc Af Amer: >60 mL/min (ref >60)
GFR calc non Af Amer: >60 mL/min (ref >60)
Glucose, Bld: 171 mg/dL — ABNORMAL HIGH (ref 70–99)
Potassium: 3.5 mmol/L (ref 3.5–5.1)
Sodium: 133 mmol/L — ABNORMAL LOW (ref 135–145)

## 2018-10-28 LAB — CBC
HCT: 34.8 % — ABNORMAL LOW (ref 36.0–46.0)
Hemoglobin: 11.3 g/dL — ABNORMAL LOW (ref 12.0–15.0)
MCH: 28.1 pg (ref 26.0–34.0)
MCHC: 32.5 g/dL (ref 30.0–36.0)
MCV: 86.6 fL (ref 80.0–100.0)
Platelets: 723 10*3/uL — ABNORMAL HIGH (ref 150–400)
RBC: 4.02 MIL/uL (ref 3.87–5.11)
RDW: 14 % (ref 11.5–15.5)
WBC: 17.5 10*3/uL — ABNORMAL HIGH (ref 4.0–10.5)
nRBC: 0 % (ref 0.0–0.2)

## 2018-10-28 LAB — BODY FLUID CELL COUNT WITH DIFFERENTIAL
Eos, Fluid: 0 %
Lymphs, Fluid: 10 %
Monocyte-Macrophage-Serous Fluid: 1 % — ABNORMAL LOW (ref 50–90)
Neutrophil Count, Fluid: 89 % — ABNORMAL HIGH (ref 0–25)
Total Nucleated Cell Count, Fluid: 235 cu mm (ref 0–1000)

## 2018-10-28 LAB — PHOSPHORUS: Phosphorus: 3.8 mg/dL (ref 2.5–4.6)

## 2018-10-28 LAB — MAGNESIUM: Magnesium: 1.8 mg/dL (ref 1.7–2.4)

## 2018-10-28 MED ORDER — LIDOCAINE HCL (PF) 1 % IJ SOLN
INTRAMUSCULAR | Status: DC | PRN
  Administered 2018-10-28: 20 mL

## 2018-10-28 MED ORDER — IOHEXOL 300 MG/ML  SOLN
75.0000 mL | Freq: Once | INTRAMUSCULAR | Status: AC | PRN
  Administered 2018-10-28: 75 mL via INTRAVENOUS

## 2018-10-28 MED ORDER — HYDROMORPHONE HCL 1 MG/ML IJ SOLN
1.0000 mg | INTRAMUSCULAR | Status: DC | PRN
  Administered 2018-10-28 – 2018-10-31 (×18): 1 mg via INTRAVENOUS
  Filled 2018-10-28 (×18): qty 1

## 2018-10-28 MED ORDER — HYDROMORPHONE HCL 1 MG/ML IJ SOLN: 1.2500 mg | INTRAMUSCULAR | Status: DC | PRN

## 2018-10-28 MED ORDER — LIDOCAINE HCL 1 % IJ SOLN
INTRAMUSCULAR | Status: AC
  Filled 2018-10-28: qty 20
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
## 2018-10-28 NOTE — Progress Notes (Signed)
  Date: 10/28/2018  Patient name: [name] [name]  Medical record number: [id number]  Date of birth: 1980/03/24        I have seen and evaluated this patient and I have discussed the plan of care with the house staff. Please see their note for complete details. I concur with their findings.   Velna Ochs, MD 10/28/2018, 6:13 PM

## 2018-10-28 NOTE — Procedures (Signed)
PROCEDURE SUMMARY:  Successful US guided diagnostic left thoracentesis. Yielded 2 mL of bloody, yellow fluid. Pt tolerated procedure well. No immediate complications.  Specimen was sent for labs. CXR ordered. Ordering service notified of small yield for possible prioritization of labs requested.  EBL < 5 mL  Docia Barrier PA-C 10/28/2018 3:48 PM

## 2018-10-28 NOTE — Progress Notes (Signed)
Patient ID: [name] [name], female   DOB: 1980/01/24, 38 y.o.   MRN: [id number] Overall seems stable.  Her pain seems better controlled.  She was sleeping quietly when I walked in.  She rolled easier in the bed for me today.  Her incision looks fine.  She has good strength in her legs.  She still has a catheter in place.  She still complains of tingling in her legs.  I have spoken at length with the primary team.  Plan is for continued efforts to address the left pleural effusion.  Lan CT scan of the chest today.  Plan MRI of the thoracic spine with and without contrast for surveillance in the next few days.  Continue IV vancomycin for MRSA epidural abscess and pleural effusion.

## 2018-10-28 NOTE — Progress Notes (Signed)
Subjective: Patient denies new symptoms. She states that her rash is improving and her pain is well controlled. Denies fever, chills, dizziness. Numbness and tingling is stable and continues to have good strength.  Objective:  Vital signs in last 24 hours: Vitals:   10/27/18 2300 10/28/18 0023 10/28/18 0343 10/28/18 0455  BP:  131/82 131/79   Pulse:      Resp:      Temp: 99.2 F (37.3 C) 100.1 F (37.8 C)    TempSrc: Oral Oral    SpO2:  98% 98%   Weight:    83.7 kg  Height:       Physical Exam  Constitutional: She is oriented to person, place, and time. No distress.  HENT:  Head: Atraumatic.  Eyes: EOM are normal.  Neck: Normal range of motion.  Cardiovascular: Normal rate, regular rhythm, normal heart sounds and intact distal pulses. Exam reveals no gallop and no friction rub.  No murmur heard. Pulmonary/Chest: Effort normal. No respiratory distress. She exhibits tenderness (left flank pain - stable).  Abdominal: Soft. There is no abdominal tenderness.  Musculoskeletal:        General: No edema.  Neurological: She is alert and oriented to person, place, and time. She displays no weakness. Coordination normal.  Bilateral leg paresthesias.   Skin: Skin is warm and dry. She is not diaphoretic.        Anti-microbials: 1. Vancomycin (09/30 - 10/16) 2. Cefepime (09/30 - 10/05) 3. Metronidazole (09/30 - 10/05)  Cultures: 1. No growth at 4 days 2. Epidural abscess culture: MRSA  Pertinent labs/Imaging:  CBC Latest Ref Rng & Units 10/28/2018 10/27/2018 10/26/2018  WBC 4.0 - 10.5 K/uL 17.5(H) 14.9(H) 14.2(H)  Hemoglobin 12.0 - 15.0 g/dL 11.3(L) 10.2(L) 10.4(L)  Hematocrit 36.0 - 46.0 % 34.8(L) 31.9(L) 31.0(L)  Platelets 150 - 400 K/uL 723(H) 643(H) 667(H)    CT chest:  1. There is a small to moderate dependent left pleural effusion, loculated appearing with associated atelectasis or consolidation, and slightly decreased in volume in comparison to CT dated  10/20/2018. No specific etiology appreciated by CT.  2. Redemonstrated subcutaneous air and fluid collection of the upper inner right breast (series 3, image 54), of uncertain significance, possibly a small cutaneous abscess.    Patient Summary:  [name] [name] is a 38 year old female with a pertinent past medical history of degenerative disc disc disease anxiety and opioid use disorder.  She presented to [facility] ED on 9/30 with a 5-day history of nonradiating sharp chest pain with bilateral leg numbness after a fall.  The fall occurred 5 days prior to the onset of the numbness when she fell out of bed as a result of a nightmare. Two days after the fall she began to experience left-sided thoracic numbness which progressively worsened and began to radiate down her legs with paresthesias in her legs.  During this time, she had increasing abdominal pressure and noted inability to void.  She admitted to using opioids within 6 to 8 hours of admission.  She denies reusing needles or using the clean needle exchange program.  At this time she denied fever, nausea or vomiting, weight changes, or chills.  She initially had a leukocytosis of 20.3 with a left shift without a lactic acidosis. CT chest showed moderate-sized complex appearing, loculated left-sided pleural effusion with compression of the left lower lobe atelectasis.  Trace right-sided pleural effusion with adjacent compression atelectasis. IR was consulted to drain the pleural effusion. Received vancomycin + cefepime  in ER, which were continued with the addition of Metronidazole. MRI showed Epidural collection involving the dorsal epidural space extending from T7 through T10 system with epidural abscess.  Neurosurgery was consulted.  10/01: [name] [name] underwent a T8 and T9 laminectomy and medial facetectomy for evacuation of the epidural abcess by Dr. Ronnald Ramp. Abscess cultures were obtained. Patient refused thoracentesis via IR due to pain.  Patient symptoms were concerning for infective endocarditis, but TTE was negative for vegetations.   10/02: Blood cultures remain no growth at days 2. Abscess cultures grew MRSA, patient remained on broad spectrum Abs due to concerns of polymicrobial infection. 2nd attempt to drain the pleural effusion by IR without success. Patient could not tolerate the procedure due to pain. Due to high suspicion for endocarditis, TEE was performed and was negative for vegetations. Considering the effusion has slightly decreased since admission, IR recommended holding off on thoracentesis until patients pain was better controlled. Patients leukocytosis decreased to 16.8.  10/03: Patient stable. continued antibiotics. Leukocytosis 16.3.  10/08: CT shows stable loculated pleural effusion minimally changed form prior study. Leukocytosis of 17.5 with low grade temperature of 100.1. We will reach out to IR today for a 3rd attempt at thoracentesis.   Assessment/Plan:  Principal Problem:   Septic embolism (HCC) Active Problems:   Loculated pleural effusion   Spinal epidural abscess   IVDU (intravenous drug user)   Urinary retention   [name]   #Loculated pleural effusion:  - Patient is stable without worsening of her symptoms. She continues have a significantly elevated leukocytosis of 17.5, on day 9 of IV vancomycin.  -  Repeat CT chest showed minimally reduced loculated pleural on the left that is minimally changed since prior study.  - These findings are concerning that her infection will not improve without better source controlled.This was discussed with the patient and she agrees to have the thoracentesis.  - Consult IR for 3rd attempt at thoracentesis vs CT surgery. IR agreed to thoracentesis  - Given her tenuous clinical picture, we will likely keep her on IV vancomycin for at least 4 to 6 weeks. We will reevaluate the patient to determine if she qualifies for PO Linezolid as she progresses. We will  continue to closely monitor the patient.   #Thoracic spine epidural abscess s/p thoracic decompression and laminectomy. - Continue IV vancomycin.  #[name]: - Rash is improving. We will continue to monitor this problem.  - Continue hydroxyzine prn for pruritis.  Diet: Heart IVF:  None VTE:  Enoxaparin 40mg  Code:  Full  Dispo: Anticipate discharge pending clinical improvement.   Marianna Payment, MD 10/28/2018, 5:41 AM Pager: [phone]

## 2018-10-29 LAB — BASIC METABOLIC PANEL
Anion gap: 12 (ref 5–15)
BUN: 9 mg/dL (ref 6–20)
CO2: 25 mmol/L (ref 22–32)
Calcium: 8.9 mg/dL (ref 8.9–10.3)
Chloride: 97 mmol/L — ABNORMAL LOW (ref 98–111)
Creatinine, Ser: 0.56 mg/dL (ref 0.44–1.00)
GFR calc Af Amer: >60 mL/min (ref >60)
GFR calc non Af Amer: >60 mL/min (ref >60)
Glucose, Bld: 139 mg/dL — ABNORMAL HIGH (ref 70–99)
Potassium: 3.9 mmol/L (ref 3.5–5.1)
Sodium: 134 mmol/L — ABNORMAL LOW (ref 135–145)

## 2018-10-29 LAB — CBC
HCT: 33.8 % — ABNORMAL LOW (ref 36.0–46.0)
Hemoglobin: 11.1 g/dL — ABNORMAL LOW (ref 12.0–15.0)
MCH: 28.2 pg (ref 26.0–34.0)
MCHC: 32.8 g/dL (ref 30.0–36.0)
MCV: 85.8 fL (ref 80.0–100.0)
Platelets: 618 10*3/uL — ABNORMAL HIGH (ref 150–400)
RBC: 3.94 MIL/uL (ref 3.87–5.11)
RDW: 14.1 % (ref 11.5–15.5)
WBC: 15.7 10*3/uL — ABNORMAL HIGH (ref 4.0–10.5)
nRBC: 0 % (ref 0.0–0.2)

## 2018-10-29 LAB — VANCOMYCIN, TROUGH: Vancomycin Tr: 7 ug/mL — ABNORMAL LOW (ref 15–20)

## 2018-10-29 LAB — VANCOMYCIN, PEAK
Vancomycin Pk: 10 ug/mL — ABNORMAL LOW (ref 30–40)
Vancomycin Pk: 41 ug/mL — ABNORMAL HIGH (ref 30–40)

## 2018-10-29 LAB — CALCIUM, IONIZED: Calcium, Ionized, Serum: 5.1 mg/dL (ref 4.5–5.6)

## 2018-10-29 MED ORDER — GABAPENTIN 300 MG PO CAPS
300.0000 mg | ORAL_CAPSULE | Freq: Three times a day (TID) | ORAL | Status: DC
  Administered 2018-10-29 – 2018-10-30 (×4): 300 mg via ORAL
  Filled 2018-10-29 (×4): qty 1

## 2018-10-29 MED ORDER — OXYCODONE HCL 5 MG PO TABS
15.0000 mg | ORAL_TABLET | ORAL | Status: DC | PRN
  Administered 2018-10-29 – 2018-11-08 (×40): 15 mg via ORAL
  Filled 2018-10-29 (×41): qty 3

## 2018-10-29 NOTE — Progress Notes (Signed)
Pharmacy Antibiotic Note  [name] [name] is a 38 y.o. female admitted on 10/20/2018 with concern for epidural abscess and septic emboli. PMH significant for IVDU. 10/1 ECHO negative for vegetations. Thoracentesis planned for 10/2 but pt could not tolerate. TEE 10/5 negative. Pharmacy has been consulted for Vancomycin dosing.  Afebrile, WBC trending down to 15.7. SCr stable at 0.56.  VP today is 41, VT is 7 - AUC 525 (goal 400-550)  Plan: Continue vancomycin 1500 mg IV every 12 hours Monitor renal fxn, and vanc levels at steady state  Height: 5\' 2"  (157.5 cm) Weight: 184 lb 8.4 oz (83.7 kg) IBW/kg (Calculated) : 50.1  Temp (24hrs), Avg:98.6 F (37 C), Min:98.6 F (37 C), Max:98.7 F (37.1 C)  Recent Labs  Lab 10/25/18 0744 10/25/18 1622  10/26/18 1256 10/27/18 0744 10/28/18 0651 10/28/18 0654 10/29/18 0030 10/29/18 0442 10/29/18 1330  WBC 16.5*  --   --  14.2* 14.9* 17.5*  --   --  15.7*  --   CREATININE 0.54  --   --  0.50 0.59  --  0.60 0.56  --   --   VANCOTROUGH  --  <4*  --   --   --   --   --   --   --  7*  VANCOPEAK  --   --    < >  --   --   --   --  10* 41*  --    < > = values in this interval not displayed.    Estimated Creatinine Clearance: 93.7 mL/min (by C-G formula based on SCr of 0.56 mg/dL).    Allergies  Allergen Reactions  . Epidural Tray 17gx3-1-2" [Nerve Block Tray] Other (See Comments)    Paralysis and severe pain in head/neck/shoulders.  No anaphylaxis.  . Ibuprofen Hives  . Zofran [Ondansetron Hcl] Nausea And Vomiting  . Penicillins Hives    Has patient had a PCN reaction causing immediate rash, facial/tongue/throat swelling, SOB or lightheadedness with hypotension: Yes Has patient had a PCN reaction causing severe rash involving mucus membranes or skin necrosis: No Has patient had a PCN reaction that required hospitalization No Has patient had a PCN reaction occurring within the last 10 years: No If all of the above answers are "NO", then may  proceed with Cephalosporin use.     Elicia Lamp, PharmD, BCPS Please check AMION for all [facility] contact numbers Clinical Pharmacist 10/29/2018 3:03 PM

## 2018-10-29 NOTE — Progress Notes (Signed)
Subjective: Patient is POD#1 for thoracentesis for her loculated pleural effusion and doing well. Pain continues to be a problem for her. Otherwise, she denies any new symptoms. She is clinically stable at this time.  Objective:  Vital signs in last 24 hours: Vitals:   10/28/18 0455 10/28/18 0817 10/28/18 1726 10/28/18 2350  BP:  125/78 116/83 130/76  Pulse:  97 92 88  Resp:  16 16   Temp:  98.2 F (36.8 C) 98.6 F (37 C) 98.6 F (37 C)  TempSrc:  Oral Oral Oral  SpO2:  100% 100% 98%  Weight: 83.7 kg     Height:       Physical Exam  Constitutional: She is oriented to person, place, and time.  HENT:  Head: Atraumatic.  Eyes: EOM are normal.  Neck: Normal range of motion.  Cardiovascular: Normal rate, regular rhythm, normal heart sounds and intact distal pulses. Exam reveals no friction rub.  No murmur heard. Pulmonary/Chest: Effort normal. No respiratory distress. She exhibits tenderness (stable left sided flank pain).  Abdominal: Soft. Bowel sounds are normal. She exhibits no distension. There is no abdominal tenderness.  Musculoskeletal: Normal range of motion.  Neurological: She is alert and oriented to person, place, and time.  Continues to have paresthesias bilateral lower extremities.  Skin: She is not diaphoretic.    Anti-microbials: (Day 10 of AB) 1. Vancomycin (09/30 - 10/16) 2. Cefepime (09/30 - 10/05) 3. Metronidazole (09/30 - 10/05)  Cultures: 1. Body fluid culture: pending  Pertinent labs/Imaging: CBC Latest Ref Rng & Units 10/29/2018 10/28/2018 10/27/2018  WBC 4.0 - 10.5 K/uL 15.7(H) 17.5(H) 14.9(H)  Hemoglobin 12.0 - 15.0 g/dL 11.1(L) 11.3(L) 10.2(L)  Hematocrit 36.0 - 46.0 % 33.8(L) 34.8(L) 31.9(L)  Platelets 150 - 400 K/uL 618(H) 723(H) 643(H)  1.   Assessment/Plan:  Principal Problem:   Septic embolism (HCC) Active Problems:   Loculated pleural effusion   Spinal epidural abscess   IVDU (intravenous drug user)   Urinary retention   [name] [name] is a 38 year old female with a pertinent past medical history of degenerative disc disc  disease anxiety and opioid use disorder.  She presented to [facility] health ED on 9/30 with a 5-day history of nonradiating sharp chest pain with bilateral leg numbness after a fall.  The fall occurred 5 days prior to the onset of the numbness when she fell out of bed as a result of a nightmare. Two days after the fall she began to experience left-sided thoracic numbness which progressively worsened and began to radiate down her legs with paresthesias in her legs.  During this time, she had increasing abdominal pressure and noted inability to void.  She admitted to using opioids within 6 to 8 hours of admission.  She denies reusing needles or using the clean needle exchange program.  At this time she denied fever, nausea or vomiting, weight changes, or chills.  She initially had a leukocytosis of 20.3 with a left shift without a lactic acidosis. CT chest showed moderate-sized complex appearing, loculated left-sided pleural effusion with compression of the left lower lobe atelectasis.  Trace right-sided pleural effusion with adjacent compression atelectasis. IR was consulted to drain the pleural effusion. Received vancomycin + cefepime in ER, which were continued with the addition of Metronidazole. MRI showed Epidural collection involving the dorsal epidural space extending from T7 through T10 system with epidural abscess.  Neurosurgery was consulted.  10/01: [name] underwent a T8 and T9 laminectomy and medial facetectomy for  evacuation of the epidural abcess by Dr. Ronnald Ramp. Abscess cultures were obtained. Patient refused thoracentesis via IR due to pain. Patient symptoms were concerning for infective endocarditis, but TTE was negative for vegetations.   10/02: Blood cultures remain no growth at days 2. Abscess cultures grew MRSA, patient remained on broad spectrum Abs due to concerns of polymicrobial  infection. 2nd attempt to drain the pleural effusion by IR without success. Patient could not tolerate the procedure due to pain. Due to high suspicion for endocarditis, TEE was performed and was negative for vegetations. Considering the effusion has slightly decreased since admission, IR recommended holding off on thoracentesis until patients pain was better controlled. Patients leukocytosis decreased to 16.8.  10/03: Patient stable. continued antibiotics. Leukocytosis 16.3.  10/08: CT shows stable loculated pleural effusion minimally changed form prior study. Leukocytosis of 17.5 with low grade temperature of 100.1. We will reach out to IR today for a 3rd attempt at thoracentesis.   10/09: Thoracentesis drained 2 cc of fluid. Pending fluid cultures. Leukocytosis down to 10 at this time and afebrile.   #Loculated pleural effusion:  - Follow up CXR showed no signs of pneumothorax. - Thora drained 2 cc of fluid, pending cultures.  - Continue IV vancomycin - MRI ordered for Monday.  - Increased oral oxycodone 15 mg to q4 hours - We will continue to follow her progress closely.   #Thoracic spine epidural abscess s/p thoracic decompression and laminectomy. - Continue IV vancomycin.  #[name]: - Rash is improving. We will continue to monitor this problem.  - Continue hydroxyzine prn for pruritis.  Diet: Heart IVF: None VTE: Enoxaparin 40mg  Code: Full  Dispo:Anticipate discharge pending clinical improvement.  Marianna Payment, MD 10/29/2018, 6:20 AM Pager: [phone]

## 2018-10-30 ENCOUNTER — Inpatient Hospital Stay (HOSPITAL_COMMUNITY): Payer: Medicaid Other

## 2018-10-30 DIAGNOSIS — I76 Septic arterial embolism: Secondary | ICD-10-CM

## 2018-10-30 DIAGNOSIS — R202 Paresthesia of skin: Secondary | ICD-10-CM

## 2018-10-30 LAB — CBC
HCT: 35.9 % — ABNORMAL LOW (ref 36.0–46.0)
Hemoglobin: 11.2 g/dL — ABNORMAL LOW (ref 12.0–15.0)
MCH: 27.6 pg (ref 26.0–34.0)
MCHC: 31.2 g/dL (ref 30.0–36.0)
MCV: 88.4 fL (ref 80.0–100.0)
Platelets: 698 10*3/uL — ABNORMAL HIGH (ref 150–400)
RBC: 4.06 MIL/uL (ref 3.87–5.11)
RDW: 14.4 % (ref 11.5–15.5)
WBC: 14.9 10*3/uL — ABNORMAL HIGH (ref 4.0–10.5)
nRBC: 0 % (ref 0.0–0.2)

## 2018-10-30 LAB — CULTURE, BLOOD (ROUTINE X 2)
Culture: NO GROWTH
Culture: NO GROWTH
Special Requests: ADEQUATE
Special Requests: ADEQUATE

## 2018-10-30 LAB — PATHOLOGIST SMEAR REVIEW

## 2018-10-30 MED ORDER — ESCITALOPRAM OXALATE 20 MG PO TABS
20.0000 mg | ORAL_TABLET | Freq: Every day | ORAL | Status: DC
  Administered 2018-10-30 – 2018-11-08 (×11): 20 mg via ORAL
  Filled 2018-10-30 (×11): qty 1

## 2018-10-30 MED ORDER — GABAPENTIN 400 MG PO CAPS
400.0000 mg | ORAL_CAPSULE | Freq: Three times a day (TID) | ORAL | Status: DC
  Administered 2018-10-30 – 2018-11-08 (×28): 400 mg via ORAL
  Filled 2018-10-30 (×28): qty 1

## 2018-10-30 MED ORDER — LORAZEPAM 1 MG PO TABS
1.0000 mg | ORAL_TABLET | Freq: Once | ORAL | Status: AC | PRN
  Administered 2018-10-30: 1 mg via ORAL
  Filled 2018-10-30: qty 1

## 2018-10-30 MED ORDER — GADOBUTROL 1 MMOL/ML IV SOLN
8.0000 mL | Freq: Once | INTRAVENOUS | Status: AC | PRN
  Administered 2018-10-30: 8 mL via INTRAVENOUS

## 2018-10-30 NOTE — Progress Notes (Signed)
Paged for patient having ongoing lower back pain. Spoke with Mrs. Machen who states she is feeling slightly better as she'd just received her pain medication and is trying to time it to help keep her pain covered. She did not receive her robaxin last night as she did not realize she needed to ask for it and has intermittent muscle spasms. We discussed that this can be ordered every 6 hours. She states she was able to ambulate yesterday with nursing's assistance.  She states she has had some anxiety but attributes most of this to her upcoming MRI as she has a fear of enclosed spaces. She was previously on lexapro which she felt worked for her very well for anxiety and depression as well.   - MRI order moved up to today from 10/12 - cont. Prn robaxin, oxycodone, and dilaudid  - increased gabapentin to 400 mg tid  - restart lexapro 20 mg qd  - cont. Atarax prn anxiety  - one time dose ativan 1 mg with MRI   Molli Hazard A, DO 10/30/2018, 12:17 PM Pager: [phone]

## 2018-10-30 NOTE — Progress Notes (Signed)
Patient has complained of very severe back pain this morning. She received her Oxycodone 15mg  at 0758 with a dose of PRN Robaxin. I attempted to contract with the patient to rotate Oxy and Dilaudid every two hours to best manage her pain with the available PRN medications. She agreed. By [id number] when I administered her Dilaudid, she was crying and trembling in pain. She had actually asked for the dose around 0900, but I was unable to get to her immediately and back-up was unavailable. Previous notes show that the plan was to wean opiates and discharge patient on suboxone, but that she was not tolerating being weaned post-operatively. This is still the case. MD notified.

## 2018-10-30 NOTE — Progress Notes (Signed)
   Subjective:  Endorses some stabbing back pain that resolved with her medications. Parasthesias have slightly improved after starting gabapentin. She denies nausea, SOB, chest pain. She had a BM yesterday and denies abdominal pain.   Objective:  Vital signs in last 24 hours: Vitals:   10/29/18 0837 10/29/18 1500 10/29/18 1652 10/29/18 2347  BP: 118/83  120/85 120/73  Pulse: 100   (!) 104  Resp:      Temp: 98.7 F (37.1 C) 98.5 F (36.9 C) 98.5 F (36.9 C) 98.9 F (37.2 C)  TempSrc: Oral Oral Oral Oral  SpO2: 100%  100% 98%  Weight:      Height:       Constitution: NAD, supine in bed Cardio: tachycardic, regular rhythm, no m/r/g  Respiratory: Clear lung sounds bilaterally, no w/r/r  Abdominal: NTTP, soft, non-distended MSK: moving all extremities GU: foley in place, urine yellow Neuro: a&o, normal affect Skin: upper back rash, maculopapular, peeling, no warmth. Surgical site c/d/i    Assessment/Plan:  Principal Problem:   Septic embolism (HCC) Active Problems:   Loculated pleural effusion   Spinal epidural abscess   IVDU (intravenous drug user)   Urinary retention   [name]  38yo female with PMH IVDU who presented with loculated pleural effusion and epidural abscess, now s/p decompression and laminectomy.   Loculated Pleural Effusion 2cc fluid drained 10/9. Leukocytosis stable.   - cont. IV vanc - cont. Oxycodone 15mg  q4h, dilaudid 1mg  q4h prn  - f/u thoracentesis cultures, PMN predominant   Thoracic Epidural Abscess s/p decompression & laminectomy She is on day 10 IV abx and will require 6 weeks antibiotics with possible transition to po antibiotics after 4 weeks.   - MRI back planned 10/12 - f/u neurosurgery recommendations - cont. IV vanc  - pain control as above  - increase gabapentin to 400mg  tid for parasthesia - OOB to chair daily   [name] Patient developed heat rash on her back.   - cont. Atarax 25 mg tid prn - benadryl cream prn - oob to  chair to let skin breathe    Anxiety  - cont. Atarax 25 mg tid prn   IVDU Previously part of suboxone clinic. Will need to discuss at some point cessation of IV drugs and possible follow-up with new outpatient clinic.   VTE: lovenox  IVF: none Diet: regular  Code: full   Dispo: Anticipated discharge in approximately pending completion of antibiotic therapy.   Marty Heck, DO 10/30/2018, 6:40 AM Pager: [phone]

## 2018-10-30 NOTE — Progress Notes (Signed)
Patient ID: [name] [name], female   DOB: 05/05/1980, 38 y.o.   MRN: [id number] BP 107/76   Pulse (!) 101   Temp 98.5 F (36.9 C) (Oral)   Resp 16   Ht 5\' 2"  (1.575 m)   Wt 83.7 kg   LMP 08/10/2011   SpO2 99%   BMI 33.75 kg/m  Alert and oriented x 4 5/5 strength in the lower extremities Light touch intact, as is pressure. Proprioception not intact at the great toe Assessment/plan [name] [name] states that her back pain is worse now than it was about a week ago. There is a fluid collection in the canal which perfectly matches the laminectomy site. I do not believe this represents an abscess recurrence, nonetheless the fluid is causing cord compression. Her exam and the scan are not congruous. She has full strength in the lower extremities.  Given her strength there is absolutely no emergency tonight to decompress her cord, and she has eaten a full meal. I plan on redo decompression of the fluid collection tomorrow, and will make [name] [name] npo after midnight.

## 2018-10-31 ENCOUNTER — Encounter (HOSPITAL_COMMUNITY)
Admission: EM | Disposition: A | Discharge status: Home / Self Care | Payer: Self-pay | Source: Home / Self Care | Attending: Internal Medicine

## 2018-10-31 ENCOUNTER — Encounter (HOSPITAL_COMMUNITY): Payer: Self-pay | Admitting: Surgery

## 2018-10-31 ENCOUNTER — Inpatient Hospital Stay (HOSPITAL_COMMUNITY): Payer: Medicaid Other | Admitting: Certified Registered Nurse Anesthetist

## 2018-10-31 DIAGNOSIS — G061 Intraspinal abscess and granuloma: Secondary | ICD-10-CM | POA: Diagnosis present

## 2018-10-31 HISTORY — PX: LAMINECTOMY FOR CEREBROSPINAL FLUID LEAK: SHX5886

## 2018-10-31 LAB — BASIC METABOLIC PANEL
Anion gap: 10 (ref 5–15)
BUN: 10 mg/dL (ref 6–20)
CO2: 25 mmol/L (ref 22–32)
Calcium: 8.9 mg/dL (ref 8.9–10.3)
Chloride: 100 mmol/L (ref 98–111)
Creatinine, Ser: 0.49 mg/dL (ref 0.44–1.00)
GFR calc Af Amer: >60 mL/min (ref >60)
GFR calc non Af Amer: >60 mL/min (ref >60)
Glucose, Bld: 112 mg/dL — ABNORMAL HIGH (ref 70–99)
Potassium: 4.3 mmol/L (ref 3.5–5.1)
Sodium: 135 mmol/L (ref 135–145)

## 2018-10-31 LAB — CBC
HCT: 33.2 % — ABNORMAL LOW (ref 36.0–46.0)
Hemoglobin: 10.9 g/dL — ABNORMAL LOW (ref 12.0–15.0)
MCH: 28.5 pg (ref 26.0–34.0)
MCHC: 32.8 g/dL (ref 30.0–36.0)
MCV: 86.9 fL (ref 80.0–100.0)
Platelets: 593 10*3/uL — ABNORMAL HIGH (ref 150–400)
RBC: 3.82 MIL/uL — ABNORMAL LOW (ref 3.87–5.11)
RDW: 14.3 % (ref 11.5–15.5)
WBC: 10.9 10*3/uL — ABNORMAL HIGH (ref 4.0–10.5)
nRBC: 0 % (ref 0.0–0.2)

## 2018-10-31 LAB — BODY FLUID CULTURE
Culture: NO GROWTH
Special Requests: NORMAL

## 2018-10-31 SURGERY — LAMINECTOMY FOR CEREBROSPINAL FLUID LEAK
Anesthesia: General | Site: Back

## 2018-10-31 MED ORDER — MIDAZOLAM HCL 5 MG/5ML IJ SOLN
INTRAMUSCULAR | Status: DC | PRN
  Administered 2018-10-31 (×2): 1 mg via INTRAVENOUS

## 2018-10-31 MED ORDER — HYDROMORPHONE HCL 1 MG/ML IJ SOLN
INTRAMUSCULAR | Status: AC
  Filled 2018-10-31: qty 1

## 2018-10-31 MED ORDER — THROMBIN 20000 UNITS EX SOLR
CUTANEOUS | Status: AC
  Filled 2018-10-31: qty 20000

## 2018-10-31 MED ORDER — BACITRACIN ZINC 500 UNIT/GM EX OINT
TOPICAL_OINTMENT | CUTANEOUS | Status: DC | PRN
  Administered 2018-10-31: 1 via TOPICAL

## 2018-10-31 MED ORDER — PHENYLEPHRINE 40 MCG/ML (10ML) SYRINGE FOR IV PUSH (FOR BLOOD PRESSURE SUPPORT)
PREFILLED_SYRINGE | INTRAVENOUS | Status: AC
  Filled 2018-10-31: qty 10

## 2018-10-31 MED ORDER — DIPHENHYDRAMINE HCL 50 MG/ML IJ SOLN
INTRAMUSCULAR | Status: AC
  Filled 2018-10-31: qty 1

## 2018-10-31 MED ORDER — SUGAMMADEX SODIUM 200 MG/2ML IV SOLN
INTRAVENOUS | Status: DC | PRN
  Administered 2018-10-31: 400 mg via INTRAVENOUS

## 2018-10-31 MED ORDER — DEXAMETHASONE SODIUM PHOSPHATE 10 MG/ML IJ SOLN
INTRAMUSCULAR | Status: AC
  Filled 2018-10-31: qty 1

## 2018-10-31 MED ORDER — PHENYLEPHRINE 40 MCG/ML (10ML) SYRINGE FOR IV PUSH (FOR BLOOD PRESSURE SUPPORT)
PREFILLED_SYRINGE | INTRAVENOUS | Status: AC
  Filled 2018-10-31: qty 20

## 2018-10-31 MED ORDER — FENTANYL CITRATE (PF) 250 MCG/5ML IJ SOLN
INTRAMUSCULAR | Status: DC | PRN
  Administered 2018-10-31 (×2): 50 ug via INTRAVENOUS

## 2018-10-31 MED ORDER — HYDROMORPHONE HCL 1 MG/ML IJ SOLN
1.0000 mg | INTRAMUSCULAR | Status: DC | PRN
  Administered 2018-10-31: 1 mg via INTRAVENOUS
  Administered 2018-10-31 – 2018-11-01 (×4): 1.5 mg via INTRAVENOUS
  Administered 2018-11-01 (×4): 1 mg via INTRAVENOUS
  Administered 2018-11-01 – 2018-11-02 (×7): 1.5 mg via INTRAVENOUS
  Administered 2018-11-03: 1 mg via INTRAVENOUS
  Administered 2018-11-03: 1.5 mg via INTRAVENOUS
  Administered 2018-11-03 – 2018-11-04 (×10): 1 mg via INTRAVENOUS
  Administered 2018-11-04: 1.5 mg via INTRAVENOUS
  Administered 2018-11-04: 1 mg via INTRAVENOUS
  Administered 2018-11-05: 1.5 mg via INTRAVENOUS
  Administered 2018-11-05: 1 mg via INTRAVENOUS
  Administered 2018-11-05: 1.5 mg via INTRAVENOUS
  Administered 2018-11-05: 1 mg via INTRAVENOUS
  Administered 2018-11-05: 1.5 mg via INTRAVENOUS
  Administered 2018-11-05 (×3): 1 mg via INTRAVENOUS
  Administered 2018-11-06: 1.5 mg via INTRAVENOUS
  Administered 2018-11-06 (×2): 1 mg via INTRAVENOUS
  Administered 2018-11-06 (×5): 1.5 mg via INTRAVENOUS
  Administered 2018-11-07 (×6): 1 mg via INTRAVENOUS
  Filled 2018-10-31: qty 2
  Filled 2018-10-31: qty 1
  Filled 2018-10-31: qty 2
  Filled 2018-10-31 (×5): qty 1
  Filled 2018-10-31: qty 2
  Filled 2018-10-31: qty 1
  Filled 2018-10-31: qty 2
  Filled 2018-10-31 (×2): qty 1
  Filled 2018-10-31: qty 2
  Filled 2018-10-31 (×2): qty 1
  Filled 2018-10-31 (×3): qty 2
  Filled 2018-10-31: qty 1
  Filled 2018-10-31 (×2): qty 2
  Filled 2018-10-31 (×5): qty 1
  Filled 2018-10-31 (×5): qty 2
  Filled 2018-10-31 (×4): qty 1
  Filled 2018-10-31: qty 2
  Filled 2018-10-31: qty 1
  Filled 2018-10-31 (×2): qty 2
  Filled 2018-10-31: qty 1
  Filled 2018-10-31: qty 2
  Filled 2018-10-31 (×3): qty 1
  Filled 2018-10-31 (×2): qty 2
  Filled 2018-10-31 (×5): qty 1
  Filled 2018-10-31 (×2): qty 2

## 2018-10-31 MED ORDER — CELECOXIB 200 MG PO CAPS: 400.0000 mg | ORAL_CAPSULE | Freq: Once | ORAL | Status: DC

## 2018-10-31 MED ORDER — PROPOFOL 10 MG/ML IV BOLUS
INTRAVENOUS | Status: AC
  Filled 2018-10-31: qty 40

## 2018-10-31 MED ORDER — BUPIVACAINE HCL (PF) 0.5 % IJ SOLN
INTRAMUSCULAR | Status: AC
  Filled 2018-10-31: qty 30

## 2018-10-31 MED ORDER — KETAMINE HCL 10 MG/ML IJ SOLN
INTRAMUSCULAR | Status: DC | PRN
  Administered 2018-10-31: 20 mg via INTRAVENOUS

## 2018-10-31 MED ORDER — PROPOFOL 10 MG/ML IV BOLUS
INTRAVENOUS | Status: DC | PRN
  Administered 2018-10-31: 200 mg via INTRAVENOUS

## 2018-10-31 MED ORDER — DEXAMETHASONE SODIUM PHOSPHATE 10 MG/ML IJ SOLN
INTRAMUSCULAR | Status: DC | PRN
  Administered 2018-10-31: 5 mg via INTRAVENOUS

## 2018-10-31 MED ORDER — THROMBIN 20000 UNITS EX SOLR
CUTANEOUS | Status: DC | PRN
  Administered 2018-10-31: 20 mL via TOPICAL

## 2018-10-31 MED ORDER — HYDROMORPHONE HCL 1 MG/ML IJ SOLN
0.2500 mg | INTRAMUSCULAR | Status: DC | PRN
  Administered 2018-10-31 (×4): 0.5 mg via INTRAVENOUS

## 2018-10-31 MED ORDER — ROCURONIUM BROMIDE 100 MG/10ML IV SOLN
INTRAVENOUS | Status: DC | PRN
  Administered 2018-10-31: 100 mg via INTRAVENOUS

## 2018-10-31 MED ORDER — ACETAMINOPHEN 10 MG/ML IV SOLN
INTRAVENOUS | Status: AC
  Filled 2018-10-31: qty 100

## 2018-10-31 MED ORDER — ACETAMINOPHEN 10 MG/ML IV SOLN
INTRAVENOUS | Status: DC | PRN
  Administered 2018-10-31: 1000 mg via INTRAVENOUS

## 2018-10-31 MED ORDER — PROPOFOL 10 MG/ML IV BOLUS
INTRAVENOUS | Status: AC
  Filled 2018-10-31: qty 20

## 2018-10-31 MED ORDER — LIDOCAINE 2% (20 MG/ML) 5 ML SYRINGE
INTRAMUSCULAR | Status: DC | PRN
  Administered 2018-10-31: 100 mg via INTRAVENOUS

## 2018-10-31 MED ORDER — ONDANSETRON HCL 4 MG/2ML IJ SOLN
INTRAMUSCULAR | Status: AC
  Filled 2018-10-31: qty 2

## 2018-10-31 MED ORDER — MIDAZOLAM HCL 2 MG/2ML IJ SOLN
INTRAMUSCULAR | Status: AC
  Filled 2018-10-31: qty 2

## 2018-10-31 MED ORDER — DIPHENHYDRAMINE HCL 50 MG/ML IJ SOLN
INTRAMUSCULAR | Status: DC | PRN
  Administered 2018-10-31: 6.25 mg via INTRAVENOUS

## 2018-10-31 MED ORDER — HYDROMORPHONE HCL 1 MG/ML IJ SOLN
0.2500 mg | Freq: Once | INTRAMUSCULAR | Status: AC
  Administered 2018-10-31: 0.25 mg via INTRAVENOUS
  Filled 2018-10-31: qty 1

## 2018-10-31 MED ORDER — LACTATED RINGERS IV SOLN
INTRAVENOUS | Status: DC | PRN
  Administered 2018-10-31: 13:00:00 via INTRAVENOUS

## 2018-10-31 MED ORDER — KETAMINE HCL 50 MG/5ML IJ SOSY
PREFILLED_SYRINGE | INTRAMUSCULAR | Status: AC
  Filled 2018-10-31: qty 5

## 2018-10-31 MED ORDER — BACITRACIN ZINC 500 UNIT/GM EX OINT
TOPICAL_OINTMENT | CUTANEOUS | Status: AC
  Filled 2018-10-31: qty 28.35

## 2018-10-31 MED ORDER — SODIUM CHLORIDE 0.9 % IV SOLN
INTRAVENOUS | Status: DC | PRN
  Administered 2018-10-31: 50 ug/min via INTRAVENOUS

## 2018-10-31 MED ORDER — PROMETHAZINE HCL 25 MG/ML IJ SOLN: 6.2500 mg | INTRAMUSCULAR | Status: DC | PRN

## 2018-10-31 MED ORDER — PHENYLEPHRINE 40 MCG/ML (10ML) SYRINGE FOR IV PUSH (FOR BLOOD PRESSURE SUPPORT)
PREFILLED_SYRINGE | INTRAVENOUS | Status: DC | PRN
  Administered 2018-10-31 (×4): 80 ug via INTRAVENOUS
  Administered 2018-10-31: 120 ug via INTRAVENOUS
  Administered 2018-10-31 (×3): 80 ug via INTRAVENOUS

## 2018-10-31 MED ORDER — ACETAMINOPHEN 500 MG PO TABS: 1000.0000 mg | ORAL_TABLET | Freq: Once | ORAL | Status: DC

## 2018-10-31 MED ORDER — BUPIVACAINE HCL 0.5 % IJ SOLN
INTRAMUSCULAR | Status: DC | PRN
  Administered 2018-10-31: 30 mL

## 2018-10-31 MED ORDER — 0.9 % SODIUM CHLORIDE (POUR BTL) OPTIME
TOPICAL | Status: DC | PRN
  Administered 2018-10-31: 1000 mL

## 2018-10-31 MED ORDER — FENTANYL CITRATE (PF) 250 MCG/5ML IJ SOLN
INTRAMUSCULAR | Status: AC
  Filled 2018-10-31: qty 5

## 2018-10-31 SURGICAL SUPPLY — 45 items
BAG DECANTER FOR FLEXI CONT (MISCELLANEOUS) ×3 IMPLANT
BENZOIN TINCTURE PRP APPL 2/3 (GAUZE/BANDAGES/DRESSINGS) IMPLANT
BLADE CLIPPER SURG (BLADE) IMPLANT
CANISTER SUCT 3000ML PPV (MISCELLANEOUS) ×3 IMPLANT
CARTRIDGE OIL MAESTRO DRILL (MISCELLANEOUS) ×1 IMPLANT
CLOSURE WOUND 1/2 X4 (GAUZE/BANDAGES/DRESSINGS)
COVER WAND RF STERILE (DRAPES) ×3 IMPLANT
DECANTER SPIKE VIAL GLASS SM (MISCELLANEOUS) ×3 IMPLANT
DIFFUSER DRILL AIR PNEUMATIC (MISCELLANEOUS) ×3 IMPLANT
DRAPE LAPAROTOMY 100X72X124 (DRAPES) ×3 IMPLANT
DRAPE POUCH INSTRU U-SHP 10X18 (DRAPES) ×3 IMPLANT
DRAPE SURG 17X23 STRL (DRAPES) ×3 IMPLANT
DRSG OPSITE POSTOP 4X6 (GAUZE/BANDAGES/DRESSINGS) ×3 IMPLANT
DURAPREP 26ML APPLICATOR (WOUND CARE) ×3 IMPLANT
ELECT REM PT RETURN 9FT ADLT (ELECTROSURGICAL) ×3
ELECTRODE REM PT RTRN 9FT ADLT (ELECTROSURGICAL) ×1 IMPLANT
GAUZE 4X4 16PLY RFD (DISPOSABLE) IMPLANT
GAUZE SPONGE 4X4 12PLY STRL (GAUZE/BANDAGES/DRESSINGS) IMPLANT
GLOVE BIO SURGEON STRL SZ 6.5 (GLOVE) ×4 IMPLANT
GLOVE BIO SURGEON STRL SZ7 (GLOVE) ×6 IMPLANT
GLOVE BIO SURGEONS STRL SZ 6.5 (GLOVE) ×2
GLOVE ECLIPSE 6.5 STRL STRAW (GLOVE) ×3 IMPLANT
GLOVE EXAM NITRILE XL STR (GLOVE) IMPLANT
GOWN STRL REUS W/ TWL LRG LVL3 (GOWN DISPOSABLE) ×2 IMPLANT
GOWN STRL REUS W/ TWL XL LVL3 (GOWN DISPOSABLE) IMPLANT
GOWN STRL REUS W/TWL 2XL LVL3 (GOWN DISPOSABLE) IMPLANT
GOWN STRL REUS W/TWL LRG LVL3 (GOWN DISPOSABLE) ×4
GOWN STRL REUS W/TWL XL LVL3 (GOWN DISPOSABLE)
KIT BASIN OR (CUSTOM PROCEDURE TRAY) ×3 IMPLANT
KIT TURNOVER KIT B (KITS) ×3 IMPLANT
NS IRRIG 1000ML POUR BTL (IV SOLUTION) ×3 IMPLANT
OIL CARTRIDGE MAESTRO DRILL (MISCELLANEOUS) ×3
PACK LAMINECTOMY NEURO (CUSTOM PROCEDURE TRAY) ×3 IMPLANT
PAD ARMBOARD 7.5X6 YLW CONV (MISCELLANEOUS) ×9 IMPLANT
SPONGE LAP 4X18 RFD (DISPOSABLE) IMPLANT
SPONGE SURGIFOAM ABS GEL SZ50 (HEMOSTASIS) ×3 IMPLANT
STRIP CLOSURE SKIN 1/2X4 (GAUZE/BANDAGES/DRESSINGS) IMPLANT
SUT PROLENE 6 0 BV (SUTURE) ×6 IMPLANT
SUT VIC AB 0 CT1 18XCR BRD8 (SUTURE) ×1 IMPLANT
SUT VIC AB 0 CT1 8-18 (SUTURE) ×2
SUT VIC AB 2-0 CT1 18 (SUTURE) ×3 IMPLANT
SUT VIC AB 3-0 SH 8-18 (SUTURE) ×3 IMPLANT
TOWEL GREEN STERILE (TOWEL DISPOSABLE) ×3 IMPLANT
TOWEL GREEN STERILE FF (TOWEL DISPOSABLE) ×3 IMPLANT
WATER STERILE IRR 1000ML POUR (IV SOLUTION) ×3 IMPLANT

## 2018-10-31 NOTE — Op Note (Signed)
10/20/2018 - 10/31/2018  2:51 PM  PATIENT:  [name] [name]  38 y.o. female with a suspected epidural abscess recurrence. She has had increasing pain and discomfort. There have however been no neurological decline in function.   PRE-OPERATIVE DIAGNOSIS:  Epidural Abscess  POST-OPERATIVE DIAGNOSIS:  Wound Edema  PROCEDURE:  Procedure(s): Thoracic Wound Exploration  SURGEON: Surgeon(s): Ashok Pall, MD  ASSISTANTS:none  ANESTHESIA:   general  EBL:  Total I/O In: -  Out: [id number] [Urine:850; Blood:20]  BLOOD ADMINISTERED:none    COUNT:per nursing  DRAINS: none   SPECIMEN:  No Specimen  DICTATION: [name] was taken to the operating room, intubated, and placed under a general anesthetic without difficulty. She was positioned prone on a Wilson frame. Her previous incision was prepped and draped in a sterile manner. I opened the incision and found no purulence or drainage. I opened the thoracolumbar fascia and encountered no hematoma, nor infection. I believe the material on the spinal cord was surgifoam. I removed this with suction. I irrigated, then closed the wound. I approximated the thoracolumbar fascia, subcutaneous layers with vicryl sutures. I approximated the skin edges with staples.   PLAN OF CARE: Admit to inpatient   PATIENT DISPOSITION:  PACU - hemodynamically stable.   Delay start of Pharmacological VTE agent (>24hrs) due to surgical blood loss or risk of bleeding:  yes

## 2018-10-31 NOTE — Progress Notes (Signed)
   Subjective:  She is having lower back pain. She states she is otherwise feeling ok and is not too nervous for surgery. She denies nausea, shortness of breath. She has ongoing tingling in her lower extremities and thinks there is numbness in her left great toe.   Objective:  Vital signs in last 24 hours: Vitals:   10/29/18 1652 10/29/18 2347 10/30/18 0746 10/30/18 2300  BP: 120/85 120/73 107/76 108/76  Pulse:  (!) 104 (!) 101 (!) 103  Resp:   16 16  Temp: 98.5 F (36.9 C) 98.9 F (37.2 C) 98.5 F (36.9 C) 98.3 F (36.8 C)  TempSrc: Oral Oral Oral Oral  SpO2: 100% 98% 99% 99%  Weight:      Height:       Constitution: tearful, supine in bed  Cardio: tachycardic, regular rhythm Respiratory: CTA bilaterally  Abdominal: soft, non-distended, NTTP  MSK: decreased sensation left toes, sensation intact forefoot, sole of the foot and above, strength intact   Assessment/Plan:  Principal Problem:   Septic embolism (HCC) Active Problems:   Pleural effusion   Spinal epidural abscess   IVDU (intravenous drug user)   Urinary retention   [name]   Paresthesia of both legs   38yo female with PMH IVDU who presented with loculated pleural effusion and epidural abscess, now s/p decompression and laminectomy.   Thoracic Epidural Abscess MRI done yesterday evening showed second epidural abscess 11x16x16mm at T8-T9 causing significant cord compression. No osteo or discitis seen. She is s/p T8-T9 laminectomy and epidural abscess decompression on 9/29. Neurosurgery consulted overnight. Case was non-emergent and thought not to be recurrent of initial abscess. She is NPO today to undergo decompression.   - neurosurgery consulted with plans for OR today  - f/u abscess cultures - may need to consult ID to discuss if abx needs to be restarted. Will likely need IV abx entire 6 weeks pending clinical course - cont. IV vanc  - assess pain after surgery to titrate medications - extra .25mg  dilaudid  now for pain - cont. Gabapentin 400 mg tid.   Loculated Pleural Effusion Thoracentesis done 10/8, no growth to date but PMN predominant. Large persistent loculated pleural effusion seen on MRI yesterday. Measured this appears 6mm at it's largest AP view compared to 35mm on previous CT scan.   - cont. IV vanc   Anxiety and Depression IVDU Lexapro 20 mg qd restarted yesterday as she states this previously helped.   - cont. lexapro  - cont. Atarax prn  - discuss reestablishing with an outpatient suboxone clinic.    VTE: SCDs IVF: none Diet: NPO  Code: full   Dispo: Anticipated discharge in approximately pending antibiotic treatment.   Molli Hazard A, DO 10/31/2018, 5:59 AM Pager: [phone]

## 2018-10-31 NOTE — Anesthesia Preprocedure Evaluation (Addendum)
Anesthesia Evaluation  Patient identified by MRN, date of birth, ID band Patient awake    Reviewed: Allergy & Precautions, H&P , NPO status , Patient's Chart, lab work & pertinent test results  History of Anesthesia Complications Negative for: history of anesthetic complications  Airway Mallampati: II  TM Distance: >3 FB Neck ROM: full    Dental  (+) Edentulous Upper, Dental Advisory Given   Pulmonary Current Smoker,    breath sounds clear to auscultation       Cardiovascular negative cardio ROS   Rhythm:regular Rate:Normal  IMPRESSIONS     1. Left ventricular ejection fraction, by visual estimation, is 60 to 65%. The left ventricle has normal function. Normal left ventricular size. There is no left ventricular hypertrophy. 2. Global right ventricle has normal systolic function.The right ventricular size is normal. No increase in right ventricular wall thickness. 3. Left atrial size was normal. 4. Right atrial size was normal. 5. The mitral valve is normal in structure. No evidence of mitral valve regurgitation. No evidence of mitral stenosis. 6. The tricuspid valve is normal in structure. Tricuspid valve regurgitation is mild. 7. The aortic valve was not well visualized Aortic valve regurgitation was not visualized by color flow Doppler. Mild aortic valve sclerosis without stenosis. 8. The pulmonic valve was normal in structure. Pulmonic valve regurgitation is not visualized by color flow Doppler. 9. Mildly elevated pulmonary artery systolic pressure. 10. The inferior vena cava is normal in size with greater than 50% respiratory variability, suggesting right atrial pressure of 3 mmHg.   Neuro/Psych  Headaches, PSYCHIATRIC DISORDERS Anxiety Depression    GI/Hepatic GERD  ,(+)     substance abuse  cocaine use and IV drug use,   Endo/Other    Renal/GU      Musculoskeletal  (+) Arthritis , narcotic dependent   Abdominal   Peds  Hematology   Anesthesia Other Findings   Reproductive/Obstetrics                            Anesthesia Physical  Anesthesia Plan  ASA: III  Anesthesia Plan: General   Post-op Pain Management:    Induction: Intravenous  PONV Risk Score and Plan: 3 and Dexamethasone, Treatment may vary due to age or medical condition, Midazolam and Ondansetron  Airway Management Planned: Oral ETT  Additional Equipment:   Intra-op Plan:   Post-operative Plan: Extubation in OR  Informed Consent: I have reviewed the patients History and Physical, chart, labs and discussed the procedure including the risks, benefits and alternatives for the proposed anesthesia with the patient or authorized representative who has indicated his/her understanding and acceptance.     Dental advisory given  Plan Discussed with: CRNA and Anesthesiologist  Anesthesia Plan Comments:        Anesthesia Quick Evaluation

## 2018-10-31 NOTE — Anesthesia Procedure Notes (Signed)
Procedure Name: Intubation Date/Time: 10/31/2018 1:42 PM Performed by: Janene Harvey, CRNA Pre-anesthesia Checklist: Patient identified, Emergency Drugs available, Suction available and Patient being monitored Patient Re-evaluated:Patient Re-evaluated prior to induction Oxygen Delivery Method: Circle system utilized Preoxygenation: Pre-oxygenation with 100% oxygen Induction Type: IV induction Ventilation: Mask ventilation without difficulty Laryngoscope Size: Mac and 4 Grade View: Grade I Tube type: Oral Tube size: 7.0 mm Number of attempts: 1 Airway Equipment and Method: Stylet and Oral airway Placement Confirmation: ETT inserted through vocal cords under direct vision,  positive ETCO2 and breath sounds checked- equal and bilateral Secured at: 21 cm Tube secured with: Tape Dental Injury: Teeth and Oropharynx as per pre-operative assessment

## 2018-10-31 NOTE — Anesthesia Postprocedure Evaluation (Signed)
Anesthesia Post Note  Patient: [name] [name]  Procedure(s) Performed: Thoracic Wound Exploration (N/A Back)     Patient location during evaluation: PACU Anesthesia Type: General Level of consciousness: sedated Pain management: pain level controlled Vital Signs Assessment: post-procedure vital signs reviewed and stable Respiratory status: spontaneous breathing and respiratory function stable Cardiovascular status: stable Postop Assessment: no apparent nausea or vomiting Anesthetic complications: no    Last Vitals:  Vitals:   10/31/18 1603 10/31/18 1633  BP: 110/73 103/73  Pulse: 86 89  Resp: 12 16  Temp: 36.8 C 36.8 C  SpO2: 100% 96%    Last Pain:  Vitals:   10/31/18 1633  TempSrc: Oral  PainSc:                  Mikhia Dusek DANIEL

## 2018-10-31 NOTE — Transfer of Care (Signed)
Immediate Anesthesia Transfer of Care Note  Patient: [name] [name]  Procedure(s) Performed: Thoracic Wound Exploration (N/A Back)  Patient Location: PACU  Anesthesia Type:General  Level of Consciousness: drowsy  Airway & Oxygen Therapy: Patient Spontanous Breathing and Patient connected to face mask oxygen  Post-op Assessment: Report given to RN and Post -op Vital signs reviewed and stable  Post vital signs: Reviewed  Last Vitals:  Vitals Value Taken Time  BP    Temp    Pulse    Resp    SpO2      Last Pain:  Vitals:   10/31/18 1125  TempSrc:   PainSc: 8       Patients Stated Pain Goal: 0 ([id number])  Complications: No apparent anesthesia complications

## 2018-11-01 ENCOUNTER — Encounter (HOSPITAL_COMMUNITY): Payer: Self-pay | Admitting: Neurosurgery

## 2018-11-01 DIAGNOSIS — F339 Major depressive disorder, recurrent, unspecified: Secondary | ICD-10-CM

## 2018-11-01 LAB — CBC
HCT: 34.7 % — ABNORMAL LOW (ref 36.0–46.0)
Hemoglobin: 10.9 g/dL — ABNORMAL LOW (ref 12.0–15.0)
MCH: 27.7 pg (ref 26.0–34.0)
MCHC: 31.4 g/dL (ref 30.0–36.0)
MCV: 88.1 fL (ref 80.0–100.0)
Platelets: 619 10*3/uL — ABNORMAL HIGH (ref 150–400)
RBC: 3.94 MIL/uL (ref 3.87–5.11)
RDW: 14.2 % (ref 11.5–15.5)
WBC: 13.3 10*3/uL — ABNORMAL HIGH (ref 4.0–10.5)
nRBC: 0 % (ref 0.0–0.2)

## 2018-11-01 LAB — BASIC METABOLIC PANEL
Anion gap: 9 (ref 5–15)
BUN: 10 mg/dL (ref 6–20)
CO2: 27 mmol/L (ref 22–32)
Calcium: 9 mg/dL (ref 8.9–10.3)
Chloride: 100 mmol/L (ref 98–111)
Creatinine, Ser: 0.52 mg/dL (ref 0.44–1.00)
GFR calc Af Amer: >60 mL/min (ref >60)
GFR calc non Af Amer: >60 mL/min (ref >60)
Glucose, Bld: 113 mg/dL — ABNORMAL HIGH (ref 70–99)
Potassium: 4.3 mmol/L (ref 3.5–5.1)
Sodium: 136 mmol/L (ref 135–145)

## 2018-11-01 MED ORDER — DICLOFENAC SODIUM 1 % TD GEL
2.0000 g | Freq: Three times a day (TID) | TRANSDERMAL | Status: DC
  Administered 2018-11-02 – 2018-11-04 (×10): 2 g via TOPICAL
  Filled 2018-11-01: qty 100

## 2018-11-01 MED ORDER — HYDROMORPHONE HCL 1 MG/ML IJ SOLN
0.5000 mg | Freq: Once | INTRAMUSCULAR | Status: AC
  Administered 2018-11-02: 0.5 mg via INTRAVENOUS
  Filled 2018-11-01: qty 1

## 2018-11-01 NOTE — Progress Notes (Signed)
   Subjective: Patient was seen on rounds this morning. She continues to have pain, but denies progression of her symptoms. She denies chest pain, shortness of breath, weakness, or change in neurologic symptoms.   Objective:  Vital signs in last 24 hours: Vitals:   10/31/18 1548 10/31/18 1603 10/31/18 1633 10/31/18 2100  BP: 109/72 110/73 103/73 112/72  Pulse: 83 86 89 84  Resp: 14 12 16 16   Temp:  98.2 F (36.8 C) 98.2 F (36.8 C) 98 F (36.7 C)  TempSrc:   Oral Oral  SpO2: 98% 100% 96% 97%  Weight:      Height:        Physical Exam: Physical Exam  Constitutional: She is oriented to person, place, and time. No distress.  HENT:  Head: Atraumatic.  Eyes: EOM are normal.  Neck: Normal range of motion.  Cardiovascular: Normal rate, regular rhythm, normal heart sounds and intact distal pulses. Exam reveals no gallop and no friction rub.  No murmur heard. Pulmonary/Chest: Effort normal. No respiratory distress. She has no wheezes. She has no rales. She exhibits no tenderness.  Abdominal: Soft. She exhibits no distension. There is no abdominal tenderness.  Musculoskeletal: Normal range of motion.        General: No tenderness or edema.  Neurological: She is alert and oriented to person, place, and time.  Continues to have paresthesias of LE bilaterally.   Skin: Skin is warm and dry. She is not diaphoretic.       Pertinent labs/Imaging: CBC Latest Ref Rng & Units 11/01/2018 10/31/2018 10/30/2018  WBC 4.0 - 10.5 K/uL 13.3(H) 10.9(H) 14.9(H)  Hemoglobin 12.0 - 15.0 g/dL 10.9(L) 10.9(L) 11.2(L)  Hematocrit 36.0 - 46.0 % 34.7(L) 33.2(L) 35.9(L)  Platelets 150 - 400 K/uL 619(H) 593(H) 698(H)    Assessment/Plan:  Principal Problem:   Septic embolism (HCC) Active Problems:   Loculated pleural effusion   Spinal epidural abscess   IVDU (intravenous drug user)   Urinary retention   [name]   Paresthesia of both legs   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   38  y/o/ female with PMH of IVDU presented with loculated pleural effusion s/p thoracentesis (10/08) and epidural abscess s/p decompression and laminectomy (09/30) with revision on 10/11 due to concerns of cord compression bu recurrent epidural abscess. Now on day 13 of IV vancomycin with a persistent leukocytosis of 13.3.   Thoracic Epidural Abscess POD# 1 for non-emergent thoracic wound exploration due to signs and symptoms concerning for cord compression with MRI showing an 11x16x79mm fluid collection at T8-T9 concerning for recurrence of epidural abscess. Post op dx was found to be wound edema.  - f/u epidural abscess cultures? - Continue IV vancomycin.  - Pain managment with Dilaudid 1mg  q4 and oxycodone 15 mg q4. - Continue Gabapentin 400 mg TID.  - We will try to wean pain medications as tolerable.  Loculated Pleural Effusion IR Thoracentesis done 10/8. Culture shows no growth at 3 days and is PMN predominant. MRI on 10/10 shwos persistent, large loculated pleural effusion, Decreased in size CT scan.  - Continue IV vancomycin.   Anxiety and Depression IVDU - Continue Lexapro - Continue Atarax prn for anxiety.  - Suboxone clinic at D/c  Diet: SCDs (likely add enoxaparin tomorrow) IVF: None VTE: Regular Code: Full code  Dispo: Anticipated discharge pending completion of AB treatment.   Marianna Payment, MD 11/01/2018, 6:04 AM Pager: [phone]
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
## 2018-11-01 NOTE — Progress Notes (Signed)
Paged to the patients room regarding chest pain. The pain is located at the midclavicular line of the left chest at the level of the 6th rib. Patient states that she developed this chest pain during PT while walking to the door. The pain does not radiate and it is not associated dyspnea, nausea/vomiting or diaphoresis. Patient was given scheduled dose of dilaudid with which successfully alleviated her symptoms.  Vitals were within normal limits Heart and lung sounds were wnl. No murmurs, rubs or gallops.  Plan: 1. Told patient I would give one extra dose of pain medication if she needs it to help her with pain tonight. 2. Although I have a low index of suspicions for cardiac chest pain, ordered EKG.  Marianna Payment, D.O. Date 11/01/2018 Time 7:18 PM IMTP, PGY-1 Pager: [phone]

## 2018-11-01 NOTE — Progress Notes (Signed)
Contacted by nurse regarding patient having worsening chest pain and back pain.  She reported that she had recently given her the Dilaudid, oxycodone, and other pain medications and patient was reporting that have not they did not help at all.  Went to evaluate at bedside.  Patient reports that she has been having this left-sided chest pain since working with PT earlier today.  She reports that it is located over her left lower chest area, points to around her sixth rib. She reports that it does shoot straight to the back.  She also reports that it is worse with deep inspiration. Denies any shortness of breath, nausea, vomiting, diaphoresis, fevers, or chills.  She reported to [place] that the pain medication for about 2 hours however her pain returned and continues to worsen. On exam she appeared to be in distress, was tearful and in pain.  Cardiac exam was unremarkable, lung exam unremarkable.  She did have some mild warmth over the lateral right upper quadrant/left lower chest area, no bruising or redness noted, she had some tenderness to palpation around this area as well. Patient does have a history of a loculated pleural effusion in the left lower chest. She had a thoracentesis on 10/8 that only drained about 2 cc of fluid. MRI on 10/10 showed persistent pleural effusion however appears to have decreased to 27 mm from 42 mm. She does have a mild leukocytosis of 13, however remains afebrile. Will repeat CT chest to see if there is enlargement of the pleural effusion. Given the TTP will give voltaren gel. EKG was unchanged from prior, less concerned about cardiac etiology however will check a troponin.   -CT chest -One extra dose of dilaudid -Voltaren gel -Troponin

## 2018-11-01 NOTE — Progress Notes (Addendum)
Patient ID: [name] [name], female   DOB: [birth year], 38 y.o.   MRN: [id number] BP 112/72 (BP Location: Left Arm)   Pulse 84   Temp 98 F (36.7 C) (Oral)   Resp 16   Ht 5\' 2"  (1.575 m)   Wt 83.7 kg   LMP 08/10/2011   SpO2 97%   BMI 33.75 kg/m  Alert and oriented x 4 Wound is clean,dry, no signs of infection. The substance that the radiologist took for infection was gelfoam which I removed intraoperatively. No evidence of new infection  Moving all extremities today Continue abx. No repeat Mri for one month unless neuro changes.

## 2018-11-01 NOTE — Progress Notes (Signed)
Pharmacy Antibiotic Note  [name] [name] is a 38 y.o. female admitted on 10/20/2018 with concern for epidural abscess and septic emboli. PMH significant for IVDU. 10/1 ECHO negative for vegetations. Thoracentesis planned for 10/2 but pt could not tolerate. TEE 10/5 negative.   Plan: Continue vancomycin 1500 mg IV every 12 hours Monitor renal fxn, and vanc levels at steady state  Height: 5\' 2"  (157.5 cm) Weight: 184 lb 8.4 oz (83.7 kg) IBW/kg (Calculated) : 50.1  Temp (24hrs), Avg:98.3 F (36.8 C), Min:98 F (36.7 C), Max:98.7 F (37.1 C)  Recent Labs  Lab 10/25/18 1622  10/27/18 0744 10/28/18 0651 10/28/18 0654 10/29/18 0030 10/29/18 0442 10/29/18 1330 10/30/18 0644 10/31/18 0607 11/01/18 0500  WBC  --    < > 14.9* 17.5*  --   --  15.7*  --  14.9* 10.9* 13.3*  CREATININE  --    < > 0.59  --  0.60 0.56  --   --   --  0.49 0.52  VANCOTROUGH <4*  --   --   --   --   --   --  7*  --   --   --   VANCOPEAK  --    < >  --   --   --  10* 41*  --   --   --   --    < > = values in this interval not displayed.    Estimated Creatinine Clearance: 93.7 mL/min (by C-G formula based on SCr of 0.52 mg/dL).    Allergies  Allergen Reactions  . Epidural Tray 17gx3-1-2" [Nerve Block Tray] Other (See Comments)    Paralysis and severe pain in head/neck/shoulders.  No anaphylaxis.  . Ibuprofen Hives  . Zofran [Ondansetron Hcl] Nausea And Vomiting  . Penicillins Hives    Has patient had a PCN reaction causing immediate rash, facial/tongue/throat swelling, SOB or lightheadedness with hypotension: Yes Has patient had a PCN reaction causing severe rash involving mucus membranes or skin necrosis: No Has patient had a PCN reaction that required hospitalization No Has patient had a PCN reaction occurring within the last 10 years: No If all of the above answers are "NO", then may proceed with Cephalosporin use.    Levester Fresh, PharmD, BCPS, BCCCP Clinical Pharmacist [phone]  Please check  AMION for all [place] numbers  11/01/2018 10:07 AM
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
## 2018-11-01 NOTE — Progress Notes (Signed)
  Date: 11/01/2018  Patient name: [name] [name]  Medical record number: [id number]  Date of birth: 04-25-1980        I have seen and evaluated this patient and I have discussed the plan of care with the house staff. Please see their note for complete details. I concur with their findings.  Velna Ochs, MD 11/01/2018, 2:51 PM

## 2018-11-02 ENCOUNTER — Inpatient Hospital Stay (HOSPITAL_COMMUNITY): Payer: Medicaid Other

## 2018-11-02 DIAGNOSIS — R21 Rash and other nonspecific skin eruption: Secondary | ICD-10-CM

## 2018-11-02 DIAGNOSIS — N319 Neuromuscular dysfunction of bladder, unspecified: Secondary | ICD-10-CM

## 2018-11-02 LAB — CBC
HCT: 35.5 % — ABNORMAL LOW (ref 36.0–46.0)
Hemoglobin: 11.7 g/dL — ABNORMAL LOW (ref 12.0–15.0)
MCH: 28.5 pg (ref 26.0–34.0)
MCHC: 33 g/dL (ref 30.0–36.0)
MCV: 86.6 fL (ref 80.0–100.0)
Platelets: 560 10*3/uL — ABNORMAL HIGH (ref 150–400)
RBC: 4.1 MIL/uL (ref 3.87–5.11)
RDW: 14.4 % (ref 11.5–15.5)
WBC: 12.4 10*3/uL — ABNORMAL HIGH (ref 4.0–10.5)
nRBC: 0 % (ref 0.0–0.2)

## 2018-11-02 LAB — BASIC METABOLIC PANEL
Anion gap: 14 (ref 5–15)
BUN: 12 mg/dL (ref 6–20)
CO2: 22 mmol/L (ref 22–32)
Calcium: 9.3 mg/dL (ref 8.9–10.3)
Chloride: 98 mmol/L (ref 98–111)
Creatinine, Ser: 0.45 mg/dL (ref 0.44–1.00)
GFR calc Af Amer: >60 mL/min (ref >60)
GFR calc non Af Amer: >60 mL/min (ref >60)
Glucose, Bld: 144 mg/dL — ABNORMAL HIGH (ref 70–99)
Potassium: 4.2 mmol/L (ref 3.5–5.1)
Sodium: 134 mmol/L — ABNORMAL LOW (ref 135–145)

## 2018-11-02 LAB — TROPONIN I (HIGH SENSITIVITY)
Troponin I (High Sensitivity): 2 ng/L (ref <18)
Troponin I (High Sensitivity): 3 ng/L (ref <18)

## 2018-11-02 MED ORDER — ALTEPLASE 2 MG IJ SOLR
2.0000 mg | Freq: Once | INTRAMUSCULAR | Status: AC
  Administered 2018-11-02: 2 mg

## 2018-11-02 NOTE — Progress Notes (Addendum)
Subjective: Patient appears to be improving clinically.  She was sitting comfortably on the edge of the bed eating her meal.  She continues to admit to having pain in the left chest.  Work-up overnight was negative for worsening pleural effusion or cardiac etiology of chest pain.  Objective:  Vital signs in last 24 hours: Vitals:   10/31/18 2100 11/01/18 2337 11/02/18 0559 11/02/18 0744  BP: 112/72 114/83  115/70  Pulse: 84 100  98  Resp: 16 20  16   Temp: 98 F (36.7 C) 98.8 F (37.1 C)  98.2 F (36.8 C)  TempSrc: Oral   Oral  SpO2: 97% 95%  97%  Weight:   80.9 kg   Height:        Physical Exam: Physical Exam  Constitutional: She is oriented to person, place, and time. No distress.  HENT:  Head: Normocephalic and atraumatic.  Eyes: EOM are normal.  Neck: Normal range of motion.  Cardiovascular: Normal rate, regular rhythm, normal heart sounds and intact distal pulses. Exam reveals no gallop and no friction rub.  No murmur heard. Pulmonary/Chest: Effort normal and breath sounds normal. No respiratory distress. She exhibits tenderness.  Abdominal: Soft. She exhibits no distension. There is no abdominal tenderness.  Genitourinary:    Genitourinary Comments: Continues to have urinary retention likely secondary to neurogenic bladder.   Musculoskeletal: Normal range of motion.        General: No edema.  Neurological: She is alert and oriented to person, place, and time.  Continues to have paresthesias of her lower extremities bilaterally.  Strength remains intact.   Skin: Skin is warm and dry. She is not diaphoretic.  Patient's rash on the back appears to be clearing up with increased ambulation.  Surgery site appears clean and dry.     Pertinent labs/Imaging: CBC    Component Value Date/Time   WBC 12.4 (H) 11/02/2018 0704   RBC 4.10 11/02/2018 0704   HGB 11.7 (L) 11/02/2018 0704   HCT 35.5 (L) 11/02/2018 0704   PLT 560 (H) 11/02/2018 0704   MCV 86.6 11/02/2018 0704    MCH 28.5 11/02/2018 0704   MCHC 33.0 11/02/2018 0704   RDW 14.4 11/02/2018 0704   LYMPHSABS 2.9 10/20/2018 0926   MONOABS 1.0 10/20/2018 0926   EOSABS 0.2 10/20/2018 0926   BASOSABS 0.0 10/20/2018 0926       Assessment/Plan:  Principal Problem:   Septic embolism (HCC) Active Problems:   Loculated pleural effusion   Spinal epidural abscess   IVDU (intravenous drug user)   Urinary retention   [name]   Paresthesia of both legs   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   38 y/o/ female with PMH of IVDU presented with loculated pleural effusion s/p thoracentesis (10/08) and epidural abscess s/p decompression and laminectomy (09/30) with revision on 10/11 due to concerns of cord compression bu recurrent epidural abscess. Now on day 14 of IV vancomycin with a persistent leukocytosis of 12.4.   #Thoracic Epidural Abscess POD# 2 for non-emergent thoracic wound exploration and doing well.  -Continue IV vancomycin -We will continue pain management with Dilaudid 1.25 mg every 3 hours and oxycodone 15 mg every 4 hours with plans to taper pain medication slowly over the next week to 2 weeks. -We will continue gabapentin 400 mg 3 times daily   #Loculated Pleural Effusion - Continue IV vancomycin. -Patient states that she has had increased left-sided chest pain over the last 24 hours that is exacerbated by taking  deep breaths or movement.  This is likely secondary to her pleural effusion with possible increased atelectasis.  She would likely benefit from an incentive spirometer at this time.  Anxiety and Depression IVDU - Continue Lexapro - Continue Atarax prn for anxiety.    Diet: Enoxaparin 40 mg IVF: None VTE: Regular Code: Full code  Dispo: Anticipated discharge pending clinical improvement.  Marianna Payment, MD 11/02/2018, 2:24 PM Pager: [phone]
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
## 2018-11-03 DIAGNOSIS — F329 Major depressive disorder, single episode, unspecified: Secondary | ICD-10-CM

## 2018-11-03 LAB — CBC
HCT: 35 % — ABNORMAL LOW (ref 36.0–46.0)
Hemoglobin: 11.3 g/dL — ABNORMAL LOW (ref 12.0–15.0)
MCH: 28.5 pg (ref 26.0–34.0)
MCHC: 32.3 g/dL (ref 30.0–36.0)
MCV: 88.2 fL (ref 80.0–100.0)
Platelets: 556 10*3/uL — ABNORMAL HIGH (ref 150–400)
RBC: 3.97 MIL/uL (ref 3.87–5.11)
RDW: 14.4 % (ref 11.5–15.5)
WBC: 12.4 10*3/uL — ABNORMAL HIGH (ref 4.0–10.5)
nRBC: 0 % (ref 0.0–0.2)

## 2018-11-03 LAB — BASIC METABOLIC PANEL
Anion gap: 8 (ref 5–15)
BUN: 11 mg/dL (ref 6–20)
CO2: 27 mmol/L (ref 22–32)
Calcium: 9.1 mg/dL (ref 8.9–10.3)
Chloride: 101 mmol/L (ref 98–111)
Creatinine, Ser: 0.53 mg/dL (ref 0.44–1.00)
GFR calc Af Amer: >60 mL/min (ref >60)
GFR calc non Af Amer: >60 mL/min (ref >60)
Glucose, Bld: 125 mg/dL — ABNORMAL HIGH (ref 70–99)
Potassium: 4.1 mmol/L (ref 3.5–5.1)
Sodium: 136 mmol/L (ref 135–145)

## 2018-11-03 LAB — VANCOMYCIN, TROUGH: Vancomycin Tr: 10 ug/mL — ABNORMAL LOW (ref 15–20)

## 2018-11-03 NOTE — Progress Notes (Addendum)
..    Vital Signs MEWS/VS Documentation      11/02/2018 1900 11/02/2018 2100 11/02/2018 2313 11/03/2018 0542   MEWS Score:  1  1  1  3    MEWS Score Color:  Nyoka Cowden  Green  Green  Yellow   Resp:  -  -  16  -   Pulse:  -  -  (!) 106  -   BP:  -  -  125/75  -   Temp:  -  -  98.1 F (36.7 C)  -   O2 Device:  -  -  Room Air  -   Level of Consciousness:  -  Alert  -  Responds to Pain     Reassessed patient.  No acute changes.  Patient is alert and oriented X 4.  Unsure of why MEWS has LOC as Responds to Pain.    MEWS is GREEN.  Disregard information above.      Jacqulyn Ducking 11/03/2018,5:53 AM
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
## 2018-11-03 NOTE — Plan of Care (Signed)

## 2018-11-03 NOTE — Progress Notes (Signed)
Patient ID: [name] [name], female   DOB: Sep 10, 1980, 38 y.o.   MRN: [id number] Pt is no different, she looks comfortable, still describes tingling in legs, foley in place, on IV abx, MRI T reviewed and op note read and surgery discussed with Dr Christella Noa, sounds like he found mostly seroma and hemostatic agents which is what I would have suspected based on the imaging. Wound looks great. I've asked her mobilize more  And roll off her back more as she has a rash on the skin of her back and getting pressure off the wound will help it heal. Will follow from [place] now, please call if you have any questions.

## 2018-11-03 NOTE — Progress Notes (Addendum)
   Subjective: Patient states that she doesn't feel any better or any worse today. Continues to have paresthesia in her bilateral lower extremities with preserved strength. She denies worsening chest pain, shortness of breath, abdominal pain.   Objective:  Vital signs in last 24 hours: Vitals:   11/02/18 1620 11/02/18 2313 11/03/18 0541 11/03/18 0753  BP:  125/75  98/60  Pulse: 95 (!) 106  97  Resp:  16  19  Temp: 98.5 F (36.9 C) 98.1 F (36.7 C)  98.9 F (37.2 C)  TempSrc: Oral Oral  Oral  SpO2:  97%  98%  Weight:   82.1 kg   Height:        Physical Exam: Physical Exam  Constitutional: She is oriented to person, place, and time. No distress.  HENT:  Head: Atraumatic.  Eyes: EOM are normal.  Neck: Normal range of motion.  Cardiovascular: Normal rate, regular rhythm, normal heart sounds and intact distal pulses. Exam reveals no gallop and no friction rub.  No murmur heard. Pulmonary/Chest: Effort normal and breath sounds normal. No respiratory distress.  Abdominal: Soft. She exhibits no distension. There is no abdominal tenderness.  Musculoskeletal: Normal range of motion.        General: No tenderness.  Neurological: She is alert and oriented to person, place, and time.  Skin: Skin is warm and dry. She is not diaphoretic.  Continues to have rash on her back Surgical site appears clean and dry.    Pertinent labs/Imaging: CBC    Component Value Date/Time   WBC 12.4 (H) 11/03/2018 0524   RBC 3.97 11/03/2018 0524   HGB 11.3 (L) 11/03/2018 0524   HCT 35.0 (L) 11/03/2018 0524   PLT 556 (H) 11/03/2018 0524   MCV 88.2 11/03/2018 0524   MCH 28.5 11/03/2018 0524   MCHC 32.3 11/03/2018 0524   RDW 14.4 11/03/2018 0524   LYMPHSABS 2.9 10/20/2018 0926   MONOABS 1.0 10/20/2018 0926   EOSABS 0.2 10/20/2018 0926   BASOSABS 0.0 10/20/2018 0926       Assessment/Plan:  Principal Problem:   Septic embolism (HCC) Active Problems:   Pleural effusion   Spinal epidural  abscess   IVDU (intravenous drug user)   Urinary retention   [name]   Paresthesia of both legs   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    Patient Summary: 38 y/o/ female with PMH of IVDU presented with loculated pleural effusion s/p thoracentesis (10/08) and epidural abscess s/p decompression and laminectomy (09/30) with revision on 10/11due toconcerns of cord compression bu recurrent epidural abscess. Now on day 15of IV vancomycinwith a persistent leukocytosis of 12.4.Patient is in clinically stable condition. She continues to have paresthesias in her lower extremities but has 5/5 strength bilaterally. Continues to have rash on her back secondary to moisture and laying in between consistently. Encouraged to ambulate for frequently.   #Thoracic Epidural Abscess POD#3 for non-emergent thoracic wound exploration and doing well.  - Continue pain management and will begin to taper by the end of the week.  #Loculated Pleural Effusion -Continue IV vancomycin.  Anxiety and Depression IVDU - Continue Lexapro - Continue Atarax prn for anxiety.    Diet:Enoxaparin 40 mg [id number] Code:Full code  Dispo: Anticipated dischargepending clinical improvement.  Marianna Payment, MD 11/03/2018, 12:58 PM Pager: [phone]

## 2018-11-04 DIAGNOSIS — M546 Pain in thoracic spine: Secondary | ICD-10-CM

## 2018-11-04 LAB — CBC
HCT: 37.1 % (ref 36.0–46.0)
Hemoglobin: 11.9 g/dL — ABNORMAL LOW (ref 12.0–15.0)
MCH: 28.3 pg (ref 26.0–34.0)
MCHC: 32.1 g/dL (ref 30.0–36.0)
MCV: 88.3 fL (ref 80.0–100.0)
Platelets: 519 10*3/uL — ABNORMAL HIGH (ref 150–400)
RBC: 4.2 MIL/uL (ref 3.87–5.11)
RDW: 14.5 % (ref 11.5–15.5)
WBC: 11.9 10*3/uL — ABNORMAL HIGH (ref 4.0–10.5)
nRBC: 0 % (ref 0.0–0.2)

## 2018-11-04 LAB — VANCOMYCIN, PEAK: Vancomycin Pk: 30 ug/mL (ref 30–40)
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
## 2018-11-04 NOTE — Progress Notes (Signed)
Subjective: Patient states that she is about the same as yesterday. She admits to back pain in the lower thoracic region, below her surgical site. Otherwise, she denies  Chest pain, shortness of breath, weakness, or changes in neurologic deficits. Continues to have difficulty urinating but denies any pain or suprapubic pressure. Foley catheter is in place. Continues to have paresthesias in lewer extremities bilaterally.  Objective:  Vital signs in last 24 hours: Vitals:   11/03/18 2316 11/04/18 0330 11/04/18 0854 11/04/18 0906  BP: 116/78   117/82  Pulse: 97  (!) 128 (!) 107  Resp: (!) 21   19  Temp: 98.6 F (37 C)   98.7 F (37.1 C)  TempSrc:      SpO2: 99%   99%  Weight:  82.9 kg    Height:        Physical Exam: Physical Exam  Constitutional: She is oriented to person, place, and time. No distress.  HENT:  Head: Atraumatic.  Eyes: EOM are normal.  Neck: Normal range of motion.  Cardiovascular: Normal rate, regular rhythm, normal heart sounds and intact distal pulses. Exam reveals no gallop and no friction rub.  No murmur heard. Pulmonary/Chest: Effort normal and breath sounds normal. No respiratory distress. She exhibits no tenderness.  Abdominal: Soft. She exhibits no distension. There is no abdominal tenderness. There is no rebound.  Musculoskeletal: Normal range of motion.        General: No tenderness or edema.  Neurological: She is alert and oriented to person, place, and time. She has normal strength.  Cranial nerves grossly intact, has sensory changes in her lower extremities bilaterally consistent with paresthesias.  Skin: Skin is warm and dry. She is not diaphoretic.     Pertinent labs/Imaging: CBC    Component Value Date/Time   WBC 11.9 (H) 11/04/2018 0500   RBC 4.20 11/04/2018 0500   HGB 11.9 (L) 11/04/2018 0500   HCT 37.1 11/04/2018 0500   PLT 519 (H) 11/04/2018 0500   MCV 88.3 11/04/2018 0500   MCH 28.3 11/04/2018 0500   MCHC 32.1 11/04/2018 0500   RDW 14.5 11/04/2018 0500   LYMPHSABS 2.9 10/20/2018 0926   MONOABS 1.0 10/20/2018 0926   EOSABS 0.2 10/20/2018 0926   BASOSABS 0.0 10/20/2018 0926   CMP Latest Ref Rng & Units 11/03/2018 11/02/2018 11/01/2018  Glucose 70 - 99 mg/dL 125(H) 144(H) 113(H)  BUN 6 - 20 mg/dL 11 12 10   Creatinine 0.44 - 1.00 mg/dL 0.53 0.45 0.52  Sodium 135 - 145 mmol/L 136 134(L) 136  Potassium 3.5 - 5.1 mmol/L 4.1 4.2 4.3  Chloride 98 - 111 mmol/L 101 98 100  CO2 22 - 32 mmol/L 27 22 27   Calcium 8.9 - 10.3 mg/dL 9.1 9.3 9.0  Total Protein 6.5 - 8.1 g/dL - - -  Total Bilirubin 0.3 - 1.2 mg/dL - - -  Alkaline Phos 38 - 126 U/L - - -  AST 15 - 41 U/L - - -  ALT 0 - 44 U/L - - -    Assessment/Plan:  Principal Problem:   Septic embolism (HCC) Active Problems:   Pleural effusion   Spinal epidural abscess   IVDU (intravenous drug user)   Urinary retention   [name]   Paresthesia of both legs   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    Patient Summary: 38 y/o/ female with PMH of IVDU presented with loculated pleural effusion s/p thoracentesis (10/08) and epidural abscess s/p decompression and laminectomy (09/30) with  revision on 10/11due toconcerns of cord compression but recurrent epidural abscess. Now on day 16 of IV vancomycinwith a persistent leukocytosis of11.9 but trending downward.Patient is in clinically stable condition and pain management is her biggest complaint at this time. She continues to have paresthesias in her lower extremities but has 5/5 strength bilaterally. Rash appears to be improving as she is able to ambulate more.   #Thoracic Epidural Abscess POD#4 for non-emergent thoracic wound exploration and doing well.  - Continue pain management.  #Loculated Pleural Effusion -Continue IV antibiotics  Diet:Enoxaparin 40 mg [id number] Code:Full code  Dispo: Anticipated dischargependingclinical improvement.  Marianna Payment, MD 11/04/2018, 11:31 AM  Pager: [phone]

## 2018-11-04 NOTE — Progress Notes (Signed)
Pharmacy Antibiotic Note  [name] [name] is a 38 y.o. female admitted on 10/20/2018 with concern for epidural abscess and septic emboli. PMH significant for IVDU. 10/1 ECHO negative for vegetations. Thoracentesis planned for 10/2 but pt could not tolerate. TEE 10/5 negative.   Vanc levels within desired ranges. Calculated AUC 524.  WBX 11.9.  Plan: Continue vancomycin 1500 mg IV every 12 hours Monitor renal fxn, and vanc levels at steady state  Height: 5\' 2"  (157.5 cm) Weight: 182 lb 12.2 oz (82.9 kg) IBW/kg (Calculated) : 50.1  Temp (24hrs), Avg:98.5 F (36.9 C), Min:98.4 F (36.9 C), Max:98.6 F (37 C)  Recent Labs  Lab 10/29/18 0030 10/29/18 0442 10/29/18 1330  10/31/18 0607 11/01/18 0500 11/02/18 0704 11/03/18 0524 11/03/18 1357 11/04/18 0430 11/04/18 0500  WBC  --  15.7*  --    < > 10.9* 13.3* 12.4* 12.4*  --   --  11.9*  CREATININE 0.56  --   --   --  0.49 0.52 0.45 0.53  --   --   --   VANCOTROUGH  --   --  7*  --   --   --   --   --  10*  --   --   VANCOPEAK 10* 41*  --   --   --   --   --   --   --  30  --    < > = values in this interval not displayed.    Estimated Creatinine Clearance: 93.3 mL/min (by C-G formula based on SCr of 0.53 mg/dL).    Allergies  Allergen Reactions  . Epidural Tray 17gx3-1-2" [Nerve Block Tray] Other (See Comments)    Paralysis and severe pain in head/neck/shoulders.  No anaphylaxis.  . Ibuprofen Hives  . Zofran [Ondansetron Hcl] Nausea And Vomiting  . Penicillins Hives    Has patient had a PCN reaction causing immediate rash, facial/tongue/throat swelling, SOB or lightheadedness with hypotension: Yes Has patient had a PCN reaction causing severe rash involving mucus membranes or skin necrosis: No Has patient had a PCN reaction that required hospitalization No Has patient had a PCN reaction occurring within the last 10 years: No If all of the above answers are "NO", then may proceed with Cephalosporin use.    Alanda Slim,  PharmD, [facility] Clinical Pharmacist Please see AMION for all Pharmacists' Contact Phone Numbers 11/04/2018, 9:05 AM

## 2018-11-05 LAB — CBC
HCT: 35.3 % — ABNORMAL LOW (ref 36.0–46.0)
Hemoglobin: 11 g/dL — ABNORMAL LOW (ref 12.0–15.0)
MCH: 27.6 pg (ref 26.0–34.0)
MCHC: 31.2 g/dL (ref 30.0–36.0)
MCV: 88.7 fL (ref 80.0–100.0)
Platelets: 535 10*3/uL — ABNORMAL HIGH (ref 150–400)
RBC: 3.98 MIL/uL (ref 3.87–5.11)
RDW: 14.5 % (ref 11.5–15.5)
WBC: 10.4 10*3/uL (ref 4.0–10.5)
nRBC: 0 % (ref 0.0–0.2)
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
## 2018-11-05 NOTE — Progress Notes (Signed)
Subjective: Patient doing well today. She was able to ambulate to the nurses station yesterday with some help. She continues to have back pain that is well controlled with current pain medications.  Objective:  Vital signs in last 24 hours: Vitals:   11/04/18 0906 11/04/18 1623 11/04/18 2314 11/05/18 0732  BP: 117/82 123/90 112/81 105/67  Pulse: (!) 107 (!) 111 (!) 116 99  Resp: 19 19 16 16   Temp: 98.7 F (37.1 C) 98.7 F (37.1 C) 98.5 F (36.9 C) 98.4 F (36.9 C)  TempSrc:   Oral Oral  SpO2: 99% 98% 96% 96%  Weight:      Height:        Physical Exam: Physical Exam  Constitutional: She is oriented to person, place, and time. No distress.  HENT:  Head: Atraumatic.  Eyes: EOM are normal.  Neck: Normal range of motion.  Cardiovascular: Normal rate, regular rhythm, normal heart sounds and intact distal pulses. Exam reveals no gallop and no friction rub.  No murmur heard. Pulmonary/Chest: Effort normal and breath sounds normal. No respiratory distress. She exhibits no tenderness.  Abdominal: Soft. Bowel sounds are normal. She exhibits no distension and no mass. There is no abdominal tenderness.  Musculoskeletal: Normal range of motion.        General: Tenderness (low back pain) present.  Neurological: She is alert and oriented to person, place, and time.  Continues to have bilateral lower extremity parethesias  Skin: Skin is warm and dry. She is not diaphoretic.    Pertinent labs/Imaging: CBC Latest Ref Rng & Units 11/05/2018 11/04/2018 11/03/2018  WBC 4.0 - 10.5 K/uL 10.4 11.9(H) 12.4(H)  Hemoglobin 12.0 - 15.0 g/dL 11.0(L) 11.9(L) 11.3(L)  Hematocrit 36.0 - 46.0 % 35.3(L) 37.1 35.0(L)  Platelets 150 - 400 K/uL 535(H) 519(H) 556(H)   CMP Latest Ref Rng & Units 11/03/2018 11/02/2018 11/01/2018  Glucose 70 - 99 mg/dL 125(H) 144(H) 113(H)  BUN 6 - 20 mg/dL 11 12 10   Creatinine 0.44 - 1.00 mg/dL 0.53 0.45 0.52  Sodium 135 - 145 mmol/L 136 134(L) 136  Potassium 3.5 - 5.1  mmol/L 4.1 4.2 4.3  Chloride 98 - 111 mmol/L 101 98 100  CO2 22 - 32 mmol/L 27 22 27   Calcium 8.9 - 10.3 mg/dL 9.1 9.3 9.0  Total Protein 6.5 - 8.1 g/dL - - -  Total Bilirubin 0.3 - 1.2 mg/dL - - -  Alkaline Phos 38 - 126 U/L - - -  AST 15 - 41 U/L - - -  ALT 0 - 44 U/L - - -     Assessment/Plan:  Principal Problem:   Septic embolism (HCC) Active Problems:   Pleural effusion   Spinal epidural abscess   IVDU (intravenous drug user)   Urinary retention   [name]   Paresthesia of both legs   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    Patient Summary: 38 y/o/ female with PMH of IVDU presented with loculated pleural effusion s/p thoracentesis (10/08) and epidural abscess s/p decompression and laminectomy (09/30) with revision on 10/11due toconcerns of cord compression but recurrent epidural abscess. Now on day 17 of IV vancomycinwith a WBC count of 10.4 and continue to trending downward.Patient is in clinically stable condition and pain management is her biggest complaint at this time. She continues to have parethesias in her lower extremities but has 5/5 strength bilaterally. Rash appears to be improving as she is able to ambulate more. Patient was able to ambulate to the nurses station.   #  Thoracic Epidural Abscess POD#75for non-emergent thoracic wound exploration and doing well.  -Continue pain management. Will wean her pain medication over the week.   #Bladder retention - likely 2/2 to neurogenic bladder. Patient admits to having a sensation of urination for the first time since prior to surgery.  - We will possible try a voiding trial over the weekend.   #Loculated Pleural Effusion -Continue IV antibiotics  #Anxiety: - Continue Lexapro    Diet:Enoxaparin 40 mg [id number] Code:Full code  Dispo: Anticipated dischargependingclinical improvement. Still not sure if patient will be able to leave prior to finishing her full 6 weeks course of IV  antibiotics.  Marianna Payment, MD 11/05/2018, 11:25 AM Pager: [phone]

## 2018-11-06 LAB — VANCOMYCIN, TROUGH: Vancomycin Tr: 10 ug/mL — ABNORMAL LOW (ref 15–20)

## 2018-11-06 LAB — MAGNESIUM: Magnesium: 1.8 mg/dL (ref 1.7–2.4)

## 2018-11-06 LAB — CBC
HCT: 32.1 % — ABNORMAL LOW (ref 36.0–46.0)
Hemoglobin: 10.7 g/dL — ABNORMAL LOW (ref 12.0–15.0)
MCH: 29.4 pg (ref 26.0–34.0)
MCHC: 33.3 g/dL (ref 30.0–36.0)
MCV: 88.2 fL (ref 80.0–100.0)
Platelets: 435 10*3/uL — ABNORMAL HIGH (ref 150–400)
RBC: 3.64 MIL/uL — ABNORMAL LOW (ref 3.87–5.11)
RDW: 14.6 % (ref 11.5–15.5)
WBC: 8.5 10*3/uL (ref 4.0–10.5)
nRBC: 0 % (ref 0.0–0.2)

## 2018-11-06 LAB — BASIC METABOLIC PANEL
Anion gap: 7 (ref 5–15)
BUN: 9 mg/dL (ref 6–20)
CO2: 23 mmol/L (ref 22–32)
Calcium: 7.4 mg/dL — ABNORMAL LOW (ref 8.9–10.3)
Chloride: 108 mmol/L (ref 98–111)
Creatinine, Ser: 0.44 mg/dL (ref 0.44–1.00)
GFR calc Af Amer: >60 mL/min (ref >60)
GFR calc non Af Amer: >60 mL/min (ref >60)
Glucose, Bld: 127 mg/dL — ABNORMAL HIGH (ref 70–99)
Potassium: 3.2 mmol/L — ABNORMAL LOW (ref 3.5–5.1)
Sodium: 138 mmol/L (ref 135–145)

## 2018-11-06 LAB — VANCOMYCIN, PEAK: Vancomycin Pk: 31 ug/mL (ref 30–40)

## 2018-11-06 MED ORDER — POTASSIUM CHLORIDE CRYS ER 20 MEQ PO TBCR
20.0000 meq | EXTENDED_RELEASE_TABLET | Freq: Two times a day (BID) | ORAL | Status: DC
  Filled 2018-11-06 (×3): qty 1

## 2018-11-06 MED ORDER — VANCOMYCIN HCL IN DEXTROSE 1-5 GM/200ML-% IV SOLN
1000.0000 mg | Freq: Two times a day (BID) | INTRAVENOUS | Status: DC
  Administered 2018-11-06 – 2018-11-08 (×5): 1000 mg via INTRAVENOUS
  Filled 2018-11-06 (×6): qty 200

## 2018-11-06 NOTE — Progress Notes (Signed)
Paged MD about patient's high HR 130s-140s, which triggered a Red Mews Score. Patient is asymptomatic and resting comfortably in bed.  MD gave no new orders at this time. Will continue to monitor.

## 2018-11-06 NOTE — Plan of Care (Signed)
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
## 2018-11-06 NOTE — Progress Notes (Signed)
   Subjective: Patient was seen this morning on rounds.  States that her symptoms are stable at this time.  She denies chest pain or shortness of breath.  Continues to have difficulty with paresthesias in her legs but her strength is still good.  She has been able to ambulate to the nurses station.  Objective:  Vital signs in last 24 hours: Vitals:   11/05/18 0732 11/05/18 1600 11/06/18 0048 11/06/18 0748  BP: 105/67 123/79 117/66 95/70  Pulse: 99 (!) 103 (!) 103 (!) 105  Resp: 16 15  18   Temp: 98.4 F (36.9 C) 98.6 F (37 C) 98.9 F (37.2 C) 98.5 F (36.9 C)  TempSrc: Oral Oral Oral Oral  SpO2: 96% 98% 98% 98%  Weight:      Height:        Physical Exam: Physical Exam  Constitutional: She is oriented to person, place, and time. No distress.  HENT:  Head: Atraumatic.  Eyes: EOM are normal.  Neck: Normal range of motion.  Cardiovascular: Normal rate, regular rhythm, normal heart sounds and intact distal pulses. Exam reveals no gallop and no friction rub.  No murmur heard. Pulmonary/Chest: Effort normal and breath sounds normal. No respiratory distress. She exhibits tenderness (left flant tenderness).  Abdominal: Soft. Bowel sounds are normal. She exhibits no distension. There is no abdominal tenderness.  Musculoskeletal: Normal range of motion.        General: No tenderness or edema.  Neurological: She is alert and oriented to person, place, and time.  Skin: Skin is warm and dry. She is not diaphoretic.     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 Now on day 18of IV vancomycinwith a WBC count of 8.5 and continue to trending downward. Patient continues to have pain on the left side likely due to her pleural effusion and paresthesias on her legs bilaterally.  Continue to encourage ambulation as much as possible.   #Thoracic Epidural Abscess POD#9for non-emergent thoracic wound exploration and doing well.  -Hopefully be able to wean pain medication in the coming week.   #Bladder retention - Try voiding trial today with q4 bladder scans and post void scans.   #Loculated Pleural Effusion -Continue IV antibiotics  #Anxiety: - Continue Lexapro    Diet:Enoxaparin 40 mg [id number] Code:Full code  Dispo: Anticipated dischargependingclinical improvement. Still not sure if patient will be able to leave prior to finishing her full 6 weeks course of IV antibiotics.  Marianna Payment, MD 11/06/2018, 12:11 PM Pager: [phone]

## 2018-11-07 ENCOUNTER — Inpatient Hospital Stay (HOSPITAL_COMMUNITY): Payer: Medicaid Other

## 2018-11-07 DIAGNOSIS — R509 Fever, unspecified: Secondary | ICD-10-CM

## 2018-11-07 LAB — CBC
HCT: 34.3 % — ABNORMAL LOW (ref 36.0–46.0)
Hemoglobin: 11.1 g/dL — ABNORMAL LOW (ref 12.0–15.0)
MCH: 28.2 pg (ref 26.0–34.0)
MCHC: 32.4 g/dL (ref 30.0–36.0)
MCV: 87.3 fL (ref 80.0–100.0)
Platelets: 409 10*3/uL — ABNORMAL HIGH (ref 150–400)
RBC: 3.93 MIL/uL (ref 3.87–5.11)
RDW: 14.7 % (ref 11.5–15.5)
WBC: 14.8 10*3/uL — ABNORMAL HIGH (ref 4.0–10.5)
nRBC: 0 % (ref 0.0–0.2)

## 2018-11-07 LAB — URINALYSIS, ROUTINE W REFLEX MICROSCOPIC
Bilirubin Urine: NEGATIVE
Glucose, UA: NEGATIVE mg/dL
Hgb urine dipstick: NEGATIVE
Ketones, ur: NEGATIVE mg/dL
Leukocytes,Ua: NEGATIVE
Nitrite: NEGATIVE
Protein, ur: NEGATIVE mg/dL
Specific Gravity, Urine: 1.008 (ref 1.005–1.030)
pH: 6 (ref 5.0–8.0)

## 2018-11-07 LAB — BASIC METABOLIC PANEL
Anion gap: 12 (ref 5–15)
BUN: 9 mg/dL (ref 6–20)
CO2: 27 mmol/L (ref 22–32)
Calcium: 9.4 mg/dL (ref 8.9–10.3)
Chloride: 94 mmol/L — ABNORMAL LOW (ref 98–111)
Creatinine, Ser: 0.73 mg/dL (ref 0.44–1.00)
GFR calc Af Amer: >60 mL/min (ref >60)
GFR calc non Af Amer: >60 mL/min (ref >60)
Glucose, Bld: 209 mg/dL — ABNORMAL HIGH (ref 70–99)
Potassium: 4.1 mmol/L (ref 3.5–5.1)
Sodium: 133 mmol/L — ABNORMAL LOW (ref 135–145)

## 2018-11-07 MED ORDER — SODIUM CHLORIDE 0.9 % IV SOLN
1.0000 g | Freq: Once | INTRAVENOUS | Status: AC
  Administered 2018-11-07: 1 g via INTRAVENOUS
  Filled 2018-11-07: qty 1

## 2018-11-07 MED ORDER — HYDROMORPHONE HCL 1 MG/ML IJ SOLN
0.5000 mg | INTRAMUSCULAR | Status: DC | PRN
  Administered 2018-11-08 (×4): 0.5 mg via INTRAVENOUS
  Filled 2018-11-07 (×4): qty 1

## 2018-11-07 MED ORDER — SODIUM CHLORIDE 0.9 % IV SOLN
1.0000 g | INTRAVENOUS | Status: DC
  Administered 2018-11-08: 1 g via INTRAVENOUS
  Filled 2018-11-07: qty 10
  Filled 2018-11-07: qty 1

## 2018-11-07 MED ORDER — SENNA 8.6 MG PO TABS: 1.0000 | ORAL_TABLET | Freq: Two times a day (BID) | ORAL | Status: DC | PRN

## 2018-11-07 NOTE — Significant Event (Signed)
Rapid Response Event Note  Overview:Called d/t MEWS-5(HR-138, T-101.6). Pt here for pleural effusion and epidural abscess. Pt is on day 18 of vancyomycin IV. Time Called: 0155 Arrival Time: 0210 Event Type: Cardiac  Initial Focused Assessment: Pt laying in bed, alert and oriented, c/o pain in L lower chest. Lungs clear t/o, decreased in bases. Skin warm and dry. T-101.6, HR-138(ST), BP-106/70, RR-22, SpO2-96% on RA. R arm PICC site C/D/I. Pt's foley d/c'd yesterday. Per RN, pt voiding clear yellow urine since foley d/c'd.  Interventions: Tylenol for fever BC X 2 UA and cx- Rocephin 1g q24h CBC, BMP-already ordered for AM  Plan of Care (if not transferred): Complete interventions. F/u lab results with MD. Continue to monitor pt. Call RRT if further assistance needed.  Event Summary: Name of Physician Notified: Dr. Sheppard Coil at (PTA RRT)    at    Outcome: Stayed in room and stabalized  Event End Time: 0230  Dillard [place]

## 2018-11-07 NOTE — Progress Notes (Signed)
   Subjective:  [name] [name] states she is feeling well this morning although she did overtax herself yesterday. She did not have PT and decided to ambulate and exercise on her own which made her very tired. She denies feeling feverish, chills, shortness of breath, chest pain, or other acute concerns. She does have some burning with urination but believes this may be related to having her foley removed. She was excited she is urinating on her own.   Objective:  Vital signs in last 24 hours: Vitals:   11/06/18 0748 11/06/18 1633 11/07/18 0205 11/07/18 0533  BP: 95/70 119/81 106/70 112/78  Pulse: (!) 105 (!) 113 (!) 138 (!) 108  Resp: 18 19  18   Temp: 98.5 F (36.9 C) (!) 97.5 F (36.4 C) (!) 101.6 F (38.7 C) 98.6 F (37 C)  TempSrc: Oral Oral Oral Oral  SpO2: 98% 98% 96% 99%  Weight:      Height:       Constitution: NAD, supine in bed Cardio: RRR, no m/r/g  Respiratory: decreased breath sounds left, otherwise no w/r/r MSK: moving all extremities Neuro: normal affect, pleasant  Skin: c/d/i    Assessment/Plan:  Principal Problem:   Septic embolism (HCC) Active Problems:   Pleural effusion   Spinal epidural abscess   IVDU (intravenous drug user)   Urinary retention   [name]   Paresthesia of both legs   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   Loculated Pleural Effusion  Developed fever overnight, has had ongoing tachycardia the past few days. Blood cultures were drawn and patient was broadened to ceftriaxone IV. UA and urine cultures also done as she had her foley removed yesterday, UA benign. She has a leukocytosis today from 14.8 yesterday. She does have decreased breath sounds LL lung base.   - chest xr 2 view  -  cont. Ceftriaxone   MRSA+ Thoracic Epidural Abscess Surgical site appears clean. Pain is well controlled. She has been working with therapy and ambulating. Discussed that we will start tapering  opioid medications tomorrow, which she understands.   - cont. Vancomycin - cont. Prn pain medications - will begin weaning tomorrow  Bladder Retention Resolved. Making good urine yesterday and today.   Anxiety - cont. lexapro  VTE: lovenox  IVF: none  Diet: regular  Code: full   Dispo: Anticipated discharge pending completion of IV antibiotic therapy.   Marty Heck, DO 11/07/2018, 6:24 AM Pager: [phone]

## 2018-11-07 NOTE — Progress Notes (Signed)
Pt had female visitor arrive around 1800 range.  When Probation officer and oncoming nightshift RN "Shirlean Mylar" did bedside report pt appeared extremely drowsy and could not keep her eyes open while she was talking and sitting on edge of bed.  Female visitor remained during bedside report then quickly left after.  This Probation officer and oncoming shift Shirlean Mylar safely assisted pt back to bed in supine position. Call light placed within pt reach. Oncoming RN will place fall mats.  This Probation officer then Orthoptist of situation of pt increased level of drowsiness which she was not prior to female visitors visit.   Irven [place], RN

## 2018-11-08 DIAGNOSIS — Z884 Allergy status to anesthetic agent status: Secondary | ICD-10-CM

## 2018-11-08 DIAGNOSIS — R7881 Bacteremia: Secondary | ICD-10-CM

## 2018-11-08 DIAGNOSIS — Z5329 Procedure and treatment not carried out because of patient's decision for other reasons: Secondary | ICD-10-CM

## 2018-11-08 LAB — COMPREHENSIVE METABOLIC PANEL
ALT: 141 U/L — ABNORMAL HIGH (ref 0–44)
AST: 89 U/L — ABNORMAL HIGH (ref 15–41)
Albumin: 2.7 g/dL — ABNORMAL LOW (ref 3.5–5.0)
Alkaline Phosphatase: 224 U/L — ABNORMAL HIGH (ref 38–126)
Anion gap: 13 (ref 5–15)
BUN: 6 mg/dL (ref 6–20)
CO2: 24 mmol/L (ref 22–32)
Calcium: 8.9 mg/dL (ref 8.9–10.3)
Chloride: 97 mmol/L — ABNORMAL LOW (ref 98–111)
Creatinine, Ser: 0.76 mg/dL (ref 0.44–1.00)
GFR calc Af Amer: >60 mL/min (ref >60)
GFR calc non Af Amer: >60 mL/min (ref >60)
Glucose, Bld: 206 mg/dL — ABNORMAL HIGH (ref 70–99)
Potassium: 3.7 mmol/L (ref 3.5–5.1)
Sodium: 134 mmol/L — ABNORMAL LOW (ref 135–145)
Total Bilirubin: 0.8 mg/dL (ref 0.3–1.2)
Total Protein: 7.6 g/dL (ref 6.5–8.1)

## 2018-11-08 LAB — CBC WITH DIFFERENTIAL/PLATELET
Abs Immature Granulocytes: 0.04 10*3/uL (ref 0.00–0.07)
Basophils Absolute: 0 10*3/uL (ref 0.0–0.1)
Basophils Relative: 0 %
Eosinophils Absolute: 0.1 10*3/uL (ref 0.0–0.5)
Eosinophils Relative: 1 %
HCT: 33.5 % — ABNORMAL LOW (ref 36.0–46.0)
Hemoglobin: 10.3 g/dL — ABNORMAL LOW (ref 12.0–15.0)
Immature Granulocytes: 0 %
Lymphocytes Relative: 5 %
Lymphs Abs: 0.5 10*3/uL — ABNORMAL LOW (ref 0.7–4.0)
MCH: 27.6 pg (ref 26.0–34.0)
MCHC: 30.7 g/dL (ref 30.0–36.0)
MCV: 89.8 fL (ref 80.0–100.0)
Monocytes Absolute: 0.2 10*3/uL (ref 0.1–1.0)
Monocytes Relative: 2 %
Neutro Abs: 8.6 10*3/uL — ABNORMAL HIGH (ref 1.7–7.7)
Neutrophils Relative %: 92 %
Platelets: 311 10*3/uL (ref 150–400)
RBC: 3.73 MIL/uL — ABNORMAL LOW (ref 3.87–5.11)
RDW: 15.2 % (ref 11.5–15.5)
WBC: 9.5 10*3/uL (ref 4.0–10.5)
nRBC: 0 % (ref 0.0–0.2)

## 2018-11-08 LAB — RAPID URINE DRUG SCREEN, HOSP PERFORMED
Amphetamines: NOT DETECTED
Barbiturates: NOT DETECTED
Benzodiazepines: NOT DETECTED
Cocaine: NOT DETECTED
Opiates: POSITIVE — AB
Tetrahydrocannabinol: NOT DETECTED

## 2018-11-08 MED ORDER — OXYCODONE-ACETAMINOPHEN 5-325 MG PO TABS: 1.0000 | ORAL_TABLET | ORAL | 0 refills | Status: DC | PRN

## 2018-11-08 MED ORDER — LINEZOLID 600 MG PO TABS: 600.0000 mg | ORAL_TABLET | Freq: Two times a day (BID) | ORAL | 0 refills | Status: DC

## 2018-11-08 MED ORDER — HYDROMORPHONE HCL 1 MG/ML IJ SOLN: 0.5000 mg | Freq: Three times a day (TID) | INTRAMUSCULAR | Status: DC

## 2018-11-08 MED ORDER — HYDROMORPHONE HCL 2 MG PO TABS
4.0000 mg | ORAL_TABLET | ORAL | Status: DC | PRN
  Administered 2018-11-08: 4 mg via ORAL
  Filled 2018-11-08: qty 2

## 2018-11-08 MED ORDER — PROMETHAZINE HCL 25 MG/ML IJ SOLN: 12.5000 mg | Freq: Once | INTRAMUSCULAR | Status: DC

## 2018-11-08 MED FILL — LINEZOLID 600 MG TABS: 600 | 21 days supply | Qty: 42 | Fill #0

## 2018-11-08 NOTE — Progress Notes (Addendum)
Charge nurse informed this rn pt with HR 160s.  This rn arrived to room to find pt anxious sbp 120s and endorsing chest pain. Pt with episode of n/v of undigested food a minutes after onset of tachycardia. This rn paged Internal med and ekg done. Charge nurse notified rapid response nurse of pt hr 160s.

## 2018-11-08 NOTE — Progress Notes (Signed)
Patient ID: [name] [name], female   DOB: [birth year], 38 y.o.   MRN: [id number] Patient seems to be in less pain.  She is walking unassisted.  Her incision is clean dry and intact.  She describes less numbness and she has her Foley catheter removed.  She is happy she can empty her bladder.  Her staples can be removed.  We will sign off now.  Please call if we can be of any further assistance in this case.

## 2018-11-08 NOTE — Progress Notes (Addendum)
Daily Nursing Note  Patient evaluated in AM, vocalized constant pain, "all over". Got oxycodone and dilaudid.   In afternoon patient agitated and anxious to leave the hospital. Threatening to leave AMA, Dr. Marianna Payment informed.  Dr. Marianna Payment came to bedside to discuss the risks of going home prematurely and stopping IV ABX therapy. Patient stated that she understood the risks including paresthesias and possible irresolution of her infection. She said that she would like to discharge home to spend time with her son who was recently injured in a football game.    Patients linezolid delivered to bedside in early evening.   Discharge orders written  Discussed discharge instructions with patient  PICC line DC'd by PICC team  Patients loved ones will be picking her up this evening

## 2018-11-08 NOTE — Progress Notes (Signed)
Chaplain referred by nurse to see Ms. [name] [name].  Ms. [name] [name] expressed that she wanted to see her 40 year old son who had recently suffered an injury from football.  Chaplain asked if she had been able to [facility] with him and her other children, and Ms. [name] [name] noted that she had.  Chaplain offered support in acknowledging it must be hard to be away from her children. Ms. [name] [name] also expressed that she had been in the hospital for three weeks and was expected to have to stay in the hospital another four weeks.  Ms. [name] [name] noted that she wanted to ask to be switched to an oral medication to bring that four week timeframe down.  Through the conversation [name] [name] stated that she has a good support system which includes her boyfriend and children, and that they are planning on moving in with her boyfriend's parents who will offer support when she leaves.  She also noted that the doctors and nurses have treated her very well.   Chaplain worked to provide spiritual presence and support to [place] as she navigates her long hospital stay. Chaplain prayed for her healing, which [name] [name] noted that the sensation in her legs and movement in her legs had gotten better, and for protection over her family.

## 2018-11-08 NOTE — TOC Progression Note (Signed)
Transition of Care [facility] - Progression Note    Patient Details  Name: [name] [name] MRN: [id number] Date of Birth: 01-15-1981  Transition of Care [facility] CM/SW [place], [place] Phone Number: 11/08/2018, 2:19 PM  Clinical Narrative:   CSW met with patient to provide substance abuse resources. CSW provided contact information for ADS, for patient to follow up on discharge. CSW reiterated information given to patient from [facility]. CSW provided information on [facility] for needle exchange as well as substance abuse counseling. Patient agreeable to follow up, and hopeful that doctors can send her home today.    Expected Discharge Plan: Home/Self Care Barriers to Discharge: Continued Medical Work up(Still on IV antibtiotics - will need to remain in house until completed as pt is a IVDA)  Expected Discharge Plan and Services Expected Discharge Plan: Home/Self Care       Living arrangements for the past 2 months: Single Family Home                                       Social Determinants of Health (SDOH) Interventions    Readmission Risk Interventions No flowsheet data found.

## 2018-11-08 NOTE — Progress Notes (Signed)
Received call back from md. Informed Dr Isac Sarna pt HR 160s anxious endorsing chest discomfort and episode of n/v. Md reviewed ekg and notes interpretation for sinus tachycardia. Md states she will remotely monitor pts cardiac monitor and will reevaluate in an hour.

## 2018-11-08 NOTE — Care Management (Addendum)
CM contacted by attending - pt will discharge home today with PO antibiotics.  CM spoke with pt via phone.  Pt's boyfriend will pick her up at discharge and pt plans to stay with boyfriend and his mom at discharge.  Pt has active medicaid and informed CM that she will follow up with IM clinic at discharge for PCP.  Pt is in agreement for substance abuse resources - CSW will provide resources prior to discharge.  Pt is interested in ASD clinic, CM spoke with [facility] and was informed that pt can go to clinic any day between 7am and 10am and ask for resource to be triaged -  Benefits/acceptance will be determined at that time.    Pt given choice for RW - pt chose [facility]- agency contacted and referral accepted.  Pt declined 3:1  IM clinic will not allow pt back into practice .  CM made first available appt with [facility] clinic - see AVS

## 2018-11-08 NOTE — Progress Notes (Signed)
Per MD order, PICC line removed. Cath intact at 35cm. Vaseline pressure gauze to site, pressure held x 84min. No bleeding to site. Pt instructed to keep dressing CDI x 24 hours. Avoid heavy lifting, pushing or pulling x 24 hours,  If bleeding occurs hold pressure, if bleeding does not stop contact MD or go to the ED. Pt does not have any questions. [employer]
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
## 2018-11-08 NOTE — Progress Notes (Signed)
Subjective: [name] [name] States that she is feeling better. She denies pain in her flank. She states her ability to urinate has returned and she has noticed improvement of her lower extremity paresthesias. Patient continues to have improvement of her strength and has been able to ambulate to the nurses station on several occasions.   During the examination today, patient was questions regarding recent allegations of illicit drug use from an outside source. She states that she has not been using heroin while being treated at [facility]. I discussed in detail the risks of taking outside opioid medications through her PICC line and the dangers of taking heroin on top of the pain medication we are giving her. Patient stated that she wants to leave AMA at this time because her some was recently injured during a sporting event and she would like to be with him when he gets home from the hospital.   Objective:  Vital signs in last 24 hours: Vitals:   11/07/18 2300 11/08/18 0032 11/08/18 0759 11/08/18 1631  BP: 120/76 127/81 112/68 (!) 130/92  Pulse: (!) 106  (!) 103 (!) 106  Resp: 20  16 16   Temp: (!) 100.5 F (38.1 C)  98.2 F (36.8 C) 98.2 F (36.8 C)  TempSrc: Oral  Oral Oral  SpO2: 97% 93% 93% 98%  Weight:      Height:        Physical Exam: Physical Exam  Constitutional: She is oriented to person, place, and time. No distress.  HENT:  Head: Atraumatic.  Eyes: EOM are normal.  Cardiovascular: Normal rate, regular rhythm, normal heart sounds and intact distal pulses. Exam reveals no gallop and no friction rub.  No murmur heard. Pulmonary/Chest: Effort normal and breath sounds normal. No respiratory distress. She exhibits no tenderness.  Abdominal: Soft. There is no abdominal tenderness.  Musculoskeletal: Normal range of motion.        General: No tenderness or edema.  Neurological: She is alert and oriented to person, place, and time. A sensory deficit is present.  Skin: Skin is warm  and dry. She is not diaphoretic.    Pertinent labs/Imaging: CBC Latest Ref Rng & Units 11/08/2018 11/07/2018 11/06/2018  WBC 4.0 - 10.5 K/uL 9.5 14.8(H) 8.5  Hemoglobin 12.0 - 15.0 g/dL 10.3(L) 11.1(L) 10.7(L)  Hematocrit 36.0 - 46.0 % 33.5(L) 34.3(L) 32.1(L)  Platelets 150 - 400 K/uL 311 409(H) 435(H)    CMP Latest Ref Rng & Units 11/08/2018 11/07/2018 11/06/2018  Glucose 70 - 99 mg/dL 206(H) 209(H) 127(H)  BUN 6 - 20 mg/dL 6 9 9   Creatinine 0.44 - 1.00 mg/dL 0.76 0.73 0.44  Sodium 135 - 145 mmol/L 134(L) 133(L) 138  Potassium 3.5 - 5.1 mmol/L 3.7 4.1 3.2(L)  Chloride 98 - 111 mmol/L 97(L) 94(L) 108  CO2 22 - 32 mmol/L 24 27 23   Calcium 8.9 - 10.3 mg/dL 8.9 9.4 7.4(L)  Total Protein 6.5 - 8.1 g/dL 7.6 - -  Total Bilirubin 0.3 - 1.2 mg/dL 0.8 - -  Alkaline Phos 38 - 126 U/L 224(H) - -  AST 15 - 41 U/L 89(H) - -  ALT 0 - 44 U/L 141(H) - -      Assessment/Plan:  Principal Problem:   Septic embolism (HCC) Active Problems:   Loculated pleural effusion   Spinal epidural abscess   IVDU (intravenous drug user)   Urinary retention   [name]   Paresthesia of both legs   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  Fever    Patient Summary: 51 yoF with PMH of IVDU presented with loculated pleural effusion now s/p thoracentesis (10/08) and epidural abscess s/p decompression and laminectomy (09/30) with revision on 10/11due toconcerns of cord compression 2/2recurrent epidural abscess.   Patient states that she would like to go home today and leave [name] so that she could be home with her son when he gets home from the hospital due to her recent accident during a sporting event.  I spent significant amount of time discussing with her the risks and benefits of leaving roughly 3 weeks into a 6 weeks course of IV antibiotics for MRSA bacteremia.  I explained the we could switch her to p.o. linezolid for the duration of her 6-week therapy, but due to the severity of  her infection the risk of treatment failure is substantial for [name] [name].  I advised her to stay for the remainder of her IV antibiotic therapy.  I explained that if the oral therapy does not work she could have worsening neurologic symptoms including paralysis that could lead to significant physical disability and morbidity.  Furthermore, her infection may worsen to the point where mortality is a realistic complication. I explained to her the warning signs of changes in neurologic functioning as well as signs and symptoms of infection progression.  I told her that if she experiences any of the symptoms to please come back to the hospital immediately for care.  Loculated Pleural Effusion  MRSA+ Thoracic Epidural Abscess Patient states that she wanted to leave today and that she would leave [name] if she needed to.  I spoke with infectious disease several weeks ago regarding [name] [name]'s antibiotic therapy.  They said that p.o. linezolid is an alternative option for patient who no longer tolerate IV antibiotic therapy.  Considering the patient is no longer tolerant of IV antibiotic therapy in the Inpatient setting combined with the high probability of infection progress if she leaves AMA without therapy, she was prescribed linezolid 600 mg twice daily for the remainder of her 6 weeks (23 days). She  Was counseled on the risk vs the benefits of leaving without completing her full IV antibiotic course as well as tapering her IV pain medications, which could include mortality and severe morbidity.   Considering she decided to leave abruptly without sufficient time for discharge planning, the patient was given information for outpatient resources and  follow-up for neurology and [facility] services. Patient wa advised to make follow up visits for appts. Within the next week or two. The original plan was to wean the patient's IV pain medication over the course of the next week to prevent complications due  to withdrawal, but now the patient will be set up at [facility] clinic for methadone treatment as early as tomorrow between 7 AM and 10 AM. - Continue Linezolid BID q23 days.  - Prescribed Percocet 5-325mg  q4 hours for pain. Give 12 pills total.   Bladder retention Resolved.   Anxiety - cont. lexapro and Bupropion   VTE: Enoxaparin IVF: None  Diet: Regular  Code: Full code  Dispo: Anticipated discharge pending completion of IV antibiotic therapy.  Marianna Payment, MD 11/08/2018, 5:09 PM Pager: [phone]

## 2018-11-10 ENCOUNTER — Telehealth: Payer: Self-pay | Admitting: Internal Medicine

## 2018-11-10 DIAGNOSIS — G061 Intraspinal abscess and granuloma: Secondary | ICD-10-CM

## 2018-11-10 DIAGNOSIS — J90 Pleural effusion, not elsewhere classified: Secondary | ICD-10-CM

## 2018-11-10 MED ORDER — OXYCODONE-ACETAMINOPHEN 10-325 MG PO TABS: 1.0000 | ORAL_TABLET | ORAL | 0 refills | Status: AC | PRN

## 2018-11-10 NOTE — Telephone Encounter (Signed)
   Reason for call:   I received a call from Ms. [name] [name] at ~12:36 PM indicating that she would not be able to start in the Methadone clinic until next week and was in severe pain from her back and legs.   Pertinent Data:   Recently left the hospital against medical advice as she needed to care for her 40 year old son who was at home. She continued to state that there was no other individual who could assist her with his care after his football injury. She stated that the pain control was insufficient with the 5mg  Percocet tablets and that her back/leg pain was unbearable. She has picked up and used her percocet as directed since discharge with the exception that she took 1-2 on 2-3 occassions.   She denied fever, chills, vomiting, diarrhea but endorses associated dyspnea and back pain.   Assessment / Plan / Recommendations:   Given her high dose of oxycodone 15mg  q6 hours and dilaudid 1.5mg  q3 hrs prn while admitted she was certainly discharged on a low dose which was likely insufficient given her opioid tolerance and use history.   We will provide a refill to the maximum of seven days for acute pain given her worsening consisting of an appropriate dosing with 10/325mg  percocet q4 hours PRN for severe pain until at which time she can hopefully start with the methadone clinic. This change was approved by Dr. Philipp [name] the attending who treated the patient while she was admitted.   As always, pt is advised that if symptoms worsen or new symptoms arise, they should go to an urgent care facility or to to ER for further evaluation.    [name] Ludwig, MD   11/10/2018, 1:11 PM

## 2018-11-11 NOTE — Discharge Summary (Addendum)
Name: [name] [name] MRN: [id number] DOB: 1980-08-02 38 y.o. PCP: [name], MD  Date of Admission: 10/20/2018  6:27 AM Date of Discharge: 11/08/2018 Attending Physician: Dr. Philipp Ovens  Discharge Diagnosis: 1. MRSA Epidural Abscess  Discharge Medications: Allergies as of 11/08/2018      Reactions   Epidural Tray 17gx3-1-2" [nerve Block Tray] Other (See Comments)   Paralysis and severe pain in head/neck/shoulders.  No anaphylaxis.   Ibuprofen Hives   Penicillins Hives   Pt has tolerated cephalosporins on past admissions. Has patient had a PCN reaction causing immediate rash, facial/tongue/throat swelling, SOB or lightheadedness with hypotension: Yes Has patient had a PCN reaction causing severe rash involving mucus membranes or skin necrosis: No Has patient had a PCN reaction that required hospitalization No Has patient had a PCN reaction occurring within the last 10 years: No If all of the above answers are "NO", then may proceed with Cephalosporin use.   Zofran [ondansetron Hcl] Nausea And Vomiting      Medication List    STOP taking these medications   acetaminophen 500 MG tablet Commonly known as: TYLENOL   buprenorphine-naloxone 8-2 mg Subl SL tablet Commonly known as: SUBOXONE   cyclobenzaprine 5 MG tablet Commonly known as: FLEXERIL   gabapentin 300 MG capsule Commonly known as: NEURONTIN   LORazepam 1 MG tablet Commonly known as: ATIVAN     TAKE these medications   buPROPion 150 MG 12 hr tablet Commonly known as: WELLBUTRIN SR Take 1 tablet (150 mg total) by mouth daily for 3 days, THEN 1 tablet (150 mg total) 2 (two) times daily for 27 days. Start taking on: March 18, 2018   escitalopram 20 MG tablet Commonly known as: Lexapro Take 1.5 tablets (30 mg total) by mouth daily.   linezolid 600 MG tablet Commonly known as: Zyvox Take 1 tablet (600 mg total) by mouth 2 (two) times daily.       Disposition and follow-up:   Ms.Tonjua  Busha was discharged from [facility] [place] condition.  At the hospital follow up visit please address:  1.  Follow up:  (1) MRSA Epidural Abscess: Please follow-up labs including CBC and BMP.  Make sure patient is taking linezolid antibiotics as prescribed each day.  Please reassess any progressing neurologic symptoms or respiratory symptoms.  (2) opioid use disorder: Please make sure patient has been able to get set up for methadone therapy at [facility].  (3) bladder retention: Please make sure the patient has a follow-up appointment at urology.  Patient was discharged without a Foley catheter but spent the majority of her hospital stay with a catheter in.  May need to be assessed in the outpatient setting for neurogenic bladder.   2.  Labs / imaging needed at time of follow-up: CBC, BMP.  3.  Pending labs/ test needing follow-up: none  Follow-up Appointments: Follow-up Information    Eustace Moore, MD. Schedule an appointment as soon as possible for a visit in 2 week(s).   Specialty: Neurosurgery Contact information: [address] [place] [address] [phone]        [facility].   Why: Please go to clinic any day between 7 -10am and ask for Less for an assessment to determine if clinic can accept you .  Contact information:  [address] [place],  [address]  [phone]          Hospital Course by problem list: Jonni Sanger  Seaborn is a 39 year old female with a pertinent past medical history of anxiety and opioid use disorder, who presented to [facility] with MRSA bacteremia complicated by epidural abscess and loculated pleural effusion requiring 6 antibiotic therapy.  Due personal family emergency in which her son was injured during a sporting event the patient's hospital course was cut short.  She only received 3 weeks of IV vancomycin before being transitioned to PO linezolid and discharged home despite  being counseled regarding the risks of being discharged prior to her entire 6 weeks course of IV vancomycin.   1.  Loculated Pleural Effusion/Epidural Abscess 2/2 MRSA:  Patient was admitted to [facility] on 9/30 with severe left-sided chest pain and bilateral lower extremity weakness with numbness and tingling.  On admission the patient was tachycardic and tachypneic with a leukocytosis of 20.3. CT scan of the abdomen and pelvis showed evidence of a left-sided loculated pleural effusion with no evidence of bony abnormalities on spinal CT imaging.  Patient received MRI of spine due to neurologic deficits which showed Approximate 0.7 x 2.2 x 6.8 cm dorsal epidural abscess extending from T7 through T10 with resultant severe spinal stenosis.  Abscess cultures were positive for MRSA .  Neurosurgery took the patient to surgery on 9/34 abscess decompression and laminectomy.  Multiple attempts were made at thoracentesis of the left loculated pleural effusion without success due to patient not being able to tolerate the procedure secondary to pain.  Patient continued to have a stable elevated leukocytosis despite being on appropriate IV antibiotics.  Concerns for source control.  Reattempt at thoracentesis on 10/08 was successful with 2 cc of fluid removed.  Patient continued to have paresthesias in the bilateral lower extremities.  Repeat MRI which showed a fluid collection at T8-T9 concerning for recurrence of abscess.  Nonemergent thoracic wound exploration was performed on 10/11 the fluid collection was found to be wound edema with Surgifoam and not reoccurrence of abscess.  Patient began to clinically improve on IV vancomycin.  Was given a total of 3 weeks of IV vancomycin prior to being transitioned to oral linezolid and discharge.   2.  Opioid to use disorder:  Patient has a history of opioid use disorder that started after pain management for chronic low back pain.  She does have a history of IV  drug use and has previously been managed at the Suboxone clinic at [facility].  Patient states that she relapsed on 03/2018.  Patient's last use was 6 hours prior to arrival to the hospital and reports using heroin.  Patient's hospital course was complicated by pain.  Pain management was extremely difficult to achieve due to patient's high tolerance and low threshold to pain.  Patient was discharged with Percocet 5-3 25 every 4 hours for pain and was given a total of 12 pills.  Patient was set up for an appointment with the last at [facility] the day after discharge to begin methadone therapy.  Discharge Vitals:   BP (!) 130/92 (BP Location: Left Arm)    Pulse (!) 106    Temp 98.2 F (36.8 C) (Oral)    Resp 16    Ht 5\' 2"  (1.575 m)    Wt 82.9 kg    LMP 08/10/2011    SpO2 98%    BMI 33.43 kg/m   Pertinent Labs, Studies, and Procedures:  CBC Latest Ref Rng & Units 11/08/2018 11/07/2018 11/06/2018  WBC 4.0 - 10.5 K/uL 9.5 14.8(H) 8.5  Hemoglobin  12.0 - 15.0 g/dL 10.3(L) 11.1(L) 10.7(L)  Hematocrit 36.0 - 46.0 % 33.5(L) 34.3(L) 32.1(L)  Platelets 150 - 400 K/uL 311 409(H) 435(H)   CMP Latest Ref Rng & Units 11/08/2018 11/07/2018 11/06/2018  Glucose 70 - 99 mg/dL 206(H) 209(H) 127(H)  BUN 6 - 20 mg/dL 6 9 9   Creatinine 0.44 - 1.00 mg/dL 0.76 0.73 0.44  Sodium 135 - 145 mmol/L 134(L) 133(L) 138  Potassium 3.5 - 5.1 mmol/L 3.7 4.1 3.2(L)  Chloride 98 - 111 mmol/L 97(L) 94(L) 108  CO2 22 - 32 mmol/L 24 27 23   Calcium 8.9 - 10.3 mg/dL 8.9 9.4 7.4(L)  Total Protein 6.5 - 8.1 g/dL 7.6 - -  Total Bilirubin 0.3 - 1.2 mg/dL 0.8 - -  Alkaline Phos 38 - 126 U/L 224(H) - -  AST 15 - 41 U/L 89(H) - -  ALT 0 - 44 U/L 141(H) - -   CT Chest: 1. Contrast extravasation to the patient's left upper extremity. The patient was examined following this scan. There was no evidence for neurovascular compromise. These findings were relayed to the ordering physician by the CT technologist. Careful  follow-up is recommended. 2. Moderate-sized complex appearing, likely loculated left-sided pleural effusion. 3. Foci of subcutaneous gas with surrounding fat stranding involving both breasts. These may represent sites of subcutaneous injection. A developing infectious process is not excluded. Clinical correlation is recommended. 4. No acute intra-abdominal or pelvic injury. 5. No displaced rib fracture.   MRI Thoracic Spine (09/30): T1-2:  Unremarkable. T2-3: Unremarkable. T3-4:  Unremarkable. T4-5:  Unremarkable T5-6: Tiny left paracentral disc protrusion mildly flattens the left ventral thecal sac. No significant stenosis. T6-7: Tiny left paracentral disc protrusion mildly flattens the left ventral thecal sac. No significant stenosis. T7-8: Small central disc protrusion mildly indents the ventral thecal sac. Superior aspect of the dorsal epidural abscess. No significant stenosis. T8-9: Small central disc protrusion mildly indents the ventral thecal sac. Superimposed epidural abscess. Resultant fairly severe spinal stenosis. Foramina remain patent. T9-10: Mild disc bulge with superimposed small central disc protrusion. Epidural abscess. Resultant fairly severe spinal stenosis. Foramina remain patent. T10-11:  Unremarkable. T11-12:  Unremarkable. T12-L1:  Unremarkable.   CT Chest (10/08): 1. There is a small to moderate dependent left pleural effusion, loculated appearing with associated atelectasis or consolidation, and slightly decreased in volume in comparison to CT dated 10/20/2018. No specific etiology appreciated by CT. 2.  The right lung is normally aerated. 3. Redemonstrated subcutaneous air and fluid collection of the upper inner right breast (series 3, image 54), of uncertain significance, possibly a small cutaneous abscess.  MR Thoracic Spine (10/10): 1. Significant cord compression at T8 and T9 from recurrent epidural abscess at the laminectomy site. 2.  Dorsal paraspinous fluid versus abscess appears to communicate with the incision. 3. No signs of osteomyelitis or discitis. 4. Large LEFT pleural empyema, better characterized on recent CT chest. 5. These results were called by telephone at the time of interpretation on 10/30/2018 at 7:51 pm to Dr. Madilyn Fireman who verbally acknowledged these results.  Discharge Instructions: Discharge Instructions    Diet - low sodium heart healthy   Complete by: As directed    Discharge instructions   Complete by: As directed    You were hospitalized for Sepsis due to MRSA. Thank you for allowing [place] to be part of your care.   We arranged for you to follow up at:   (1) We were unable to arrange a follow up appointment with a new  primary care physician due to you leaving the hospital prior to the conclusion of your therapy. The Internal Medicine Clinic at [facility] May be able to see you for a one time visit for your hospital follow up. Please call the office at [phone]  (2) Please make an appointment for some time within the next two weeks for a follow up with Neurology as an outpatient. Please call this number [phone]) to set up an appointment.    (3) Please follow up at the [facility] clinic between 7-10 am tomorrow in order to be set up on Methadone. Please ask for Less.    Please note these changes made to your medications:   Start: (1) Linezolid 600 mg twice daily for 23 days.  (2) Oxycodone-acetaminophen 5-325 mg every 4 hours as needed for pain. (3) Bupropion 150 mg every 12 hours  (4) Esitalopram 20 mg, take 1.5 tablets for a total fo 30 mg daily.  Please make sure to Follow up with a primary care physician, Neurology, and [facility] clinic. Please make sure to take you medication as prescribed. Especially your LINEZOLID.   Please call our clinic if you have any questions or concerns, we may be able to help and keep you from a long and expensive emergency room wait. Our clinic and after hours  phone number is [phone], the best time to call is Monday through Friday 9 am to 4 pm but there is always someone available 24/7 if you have an emergency. If you need medication refills please notify your pharmacy one week in advance and they will send [place] a request.   Increase activity slowly   Complete by: As directed       Signed: Marianna Payment, MD 11/11/2018, 9:00 PM   Pager: [phone]

## 2018-11-12 LAB — CULTURE, BLOOD (ROUTINE X 2)
Culture: NO GROWTH
Culture: NO GROWTH
Special Requests: ADEQUATE

## 2018-12-14 ENCOUNTER — Inpatient Hospital Stay: Payer: Medicaid Other

## 2019-10-20 ENCOUNTER — Inpatient Hospital Stay (HOSPITAL_COMMUNITY)
Admission: EM | Admit: 2019-10-20 | Discharge: 2019-11-01 | DRG: 853 | Disposition: A | Discharge status: Home Health | Payer: Medicaid Other | Attending: Internal Medicine | Admitting: Internal Medicine

## 2019-10-20 DIAGNOSIS — F1910 Other psychoactive substance abuse, uncomplicated: Secondary | ICD-10-CM

## 2019-10-20 DIAGNOSIS — T148XXA Other injury of unspecified body region, initial encounter: Secondary | ICD-10-CM

## 2019-10-20 DIAGNOSIS — Z886 Allergy status to analgesic agent status: Secondary | ICD-10-CM

## 2019-10-20 DIAGNOSIS — E43 Unspecified severe protein-calorie malnutrition: Secondary | ICD-10-CM | POA: Diagnosis present

## 2019-10-20 DIAGNOSIS — F32A Depression, unspecified: Secondary | ICD-10-CM

## 2019-10-20 DIAGNOSIS — R748 Abnormal levels of other serum enzymes: Secondary | ICD-10-CM

## 2019-10-20 DIAGNOSIS — Z888 Allergy status to other drugs, medicaments and biological substances status: Secondary | ICD-10-CM

## 2019-10-20 DIAGNOSIS — M726 Necrotizing fasciitis: Secondary | ICD-10-CM | POA: Diagnosis present

## 2019-10-20 DIAGNOSIS — N61 Mastitis without abscess: Secondary | ICD-10-CM | POA: Diagnosis present

## 2019-10-20 DIAGNOSIS — F1390 Sedative, hypnotic, or anxiolytic use, unspecified, uncomplicated: Secondary | ICD-10-CM | POA: Diagnosis present

## 2019-10-20 DIAGNOSIS — L97829 Non-pressure chronic ulcer of other part of left lower leg with unspecified severity: Secondary | ICD-10-CM | POA: Diagnosis present

## 2019-10-20 DIAGNOSIS — Z8249 Family history of ischemic heart disease and other diseases of the circulatory system: Secondary | ICD-10-CM

## 2019-10-20 DIAGNOSIS — Z6838 Body mass index (BMI) 38.0-38.9, adult: Secondary | ICD-10-CM

## 2019-10-20 DIAGNOSIS — Z880 Allergy status to penicillin: Secondary | ICD-10-CM

## 2019-10-20 DIAGNOSIS — F17210 Nicotine dependence, cigarettes, uncomplicated: Secondary | ICD-10-CM | POA: Diagnosis present

## 2019-10-20 DIAGNOSIS — F419 Anxiety disorder, unspecified: Secondary | ICD-10-CM

## 2019-10-20 DIAGNOSIS — R7303 Prediabetes: Secondary | ICD-10-CM | POA: Diagnosis present

## 2019-10-20 DIAGNOSIS — D75839 Thrombocytosis, unspecified: Secondary | ICD-10-CM | POA: Diagnosis present

## 2019-10-20 DIAGNOSIS — B950 Streptococcus, group A, as the cause of diseases classified elsewhere: Secondary | ICD-10-CM

## 2019-10-20 DIAGNOSIS — L089 Local infection of the skin and subcutaneous tissue, unspecified: Secondary | ICD-10-CM | POA: Diagnosis present

## 2019-10-20 DIAGNOSIS — Z90710 Acquired absence of both cervix and uterus: Secondary | ICD-10-CM

## 2019-10-20 DIAGNOSIS — Z803 Family history of malignant neoplasm of breast: Secondary | ICD-10-CM

## 2019-10-20 DIAGNOSIS — L03116 Cellulitis of left lower limb: Secondary | ICD-10-CM | POA: Diagnosis present

## 2019-10-20 DIAGNOSIS — A419 Sepsis, unspecified organism: Principal | ICD-10-CM | POA: Diagnosis present

## 2019-10-20 DIAGNOSIS — Z20822 Contact with and (suspected) exposure to covid-19: Secondary | ICD-10-CM | POA: Diagnosis present

## 2019-10-20 DIAGNOSIS — E871 Hypo-osmolality and hyponatremia: Secondary | ICD-10-CM

## 2019-10-20 DIAGNOSIS — D638 Anemia in other chronic diseases classified elsewhere: Secondary | ICD-10-CM | POA: Diagnosis present

## 2019-10-20 DIAGNOSIS — L0390 Cellulitis, unspecified: Secondary | ICD-10-CM

## 2019-10-20 DIAGNOSIS — F1110 Opioid abuse, uncomplicated: Secondary | ICD-10-CM | POA: Diagnosis present

## 2019-10-20 DIAGNOSIS — D649 Anemia, unspecified: Secondary | ICD-10-CM

## 2019-10-20 NOTE — ED Triage Notes (Signed)
Pt BIB [facility] from home with a wound on Left Lower Leg.  Wound is large, about 1 inch in depth, 8 inches long, 2 inches wide, currently wrapped. Been progressively worse since Sept 4.  Pt began falling asleep in truck and pt admitted to Heroin and Xanax use.   EMS VS:  366 systolic BP HR 294 RR 10 O2 90s RA

## 2019-10-21 ENCOUNTER — Other Ambulatory Visit: Payer: Self-pay

## 2019-10-21 ENCOUNTER — Encounter (HOSPITAL_COMMUNITY): Payer: Self-pay

## 2019-10-21 ENCOUNTER — Emergency Department (HOSPITAL_COMMUNITY): Payer: Medicaid Other

## 2019-10-21 ENCOUNTER — Inpatient Hospital Stay (HOSPITAL_COMMUNITY): Payer: Medicaid Other

## 2019-10-21 DIAGNOSIS — Z8249 Family history of ischemic heart disease and other diseases of the circulatory system: Secondary | ICD-10-CM | POA: Diagnosis not present

## 2019-10-21 DIAGNOSIS — M79605 Pain in left leg: Secondary | ICD-10-CM | POA: Diagnosis not present

## 2019-10-21 DIAGNOSIS — T148XXA Other injury of unspecified body region, initial encounter: Secondary | ICD-10-CM | POA: Diagnosis not present

## 2019-10-21 DIAGNOSIS — Z888 Allergy status to other drugs, medicaments and biological substances status: Secondary | ICD-10-CM | POA: Diagnosis not present

## 2019-10-21 DIAGNOSIS — Z90710 Acquired absence of both cervix and uterus: Secondary | ICD-10-CM | POA: Diagnosis not present

## 2019-10-21 DIAGNOSIS — B950 Streptococcus, group A, as the cause of diseases classified elsewhere: Secondary | ICD-10-CM | POA: Diagnosis not present

## 2019-10-21 DIAGNOSIS — F17210 Nicotine dependence, cigarettes, uncomplicated: Secondary | ICD-10-CM | POA: Diagnosis present

## 2019-10-21 DIAGNOSIS — R748 Abnormal levels of other serum enzymes: Secondary | ICD-10-CM | POA: Diagnosis present

## 2019-10-21 DIAGNOSIS — Z880 Allergy status to penicillin: Secondary | ICD-10-CM | POA: Diagnosis not present

## 2019-10-21 DIAGNOSIS — D638 Anemia in other chronic diseases classified elsewhere: Secondary | ICD-10-CM | POA: Diagnosis present

## 2019-10-21 DIAGNOSIS — E871 Hypo-osmolality and hyponatremia: Secondary | ICD-10-CM | POA: Diagnosis present

## 2019-10-21 DIAGNOSIS — M726 Necrotizing fasciitis: Secondary | ICD-10-CM | POA: Diagnosis present

## 2019-10-21 DIAGNOSIS — L03116 Cellulitis of left lower limb: Secondary | ICD-10-CM | POA: Diagnosis present

## 2019-10-21 DIAGNOSIS — F1390 Sedative, hypnotic, or anxiolytic use, unspecified, uncomplicated: Secondary | ICD-10-CM | POA: Diagnosis present

## 2019-10-21 DIAGNOSIS — F1910 Other psychoactive substance abuse, uncomplicated: Secondary | ICD-10-CM | POA: Diagnosis not present

## 2019-10-21 DIAGNOSIS — L089 Local infection of the skin and subcutaneous tissue, unspecified: Secondary | ICD-10-CM

## 2019-10-21 DIAGNOSIS — Z6838 Body mass index (BMI) 38.0-38.9, adult: Secondary | ICD-10-CM | POA: Diagnosis not present

## 2019-10-21 DIAGNOSIS — F419 Anxiety disorder, unspecified: Secondary | ICD-10-CM

## 2019-10-21 DIAGNOSIS — E43 Unspecified severe protein-calorie malnutrition: Secondary | ICD-10-CM | POA: Diagnosis present

## 2019-10-21 DIAGNOSIS — N61 Mastitis without abscess: Secondary | ICD-10-CM | POA: Diagnosis present

## 2019-10-21 DIAGNOSIS — R7303 Prediabetes: Secondary | ICD-10-CM | POA: Diagnosis present

## 2019-10-21 DIAGNOSIS — Z886 Allergy status to analgesic agent status: Secondary | ICD-10-CM | POA: Diagnosis not present

## 2019-10-21 DIAGNOSIS — Z20822 Contact with and (suspected) exposure to covid-19: Secondary | ICD-10-CM | POA: Diagnosis present

## 2019-10-21 DIAGNOSIS — L0390 Cellulitis, unspecified: Secondary | ICD-10-CM

## 2019-10-21 DIAGNOSIS — F32A Depression, unspecified: Secondary | ICD-10-CM | POA: Diagnosis present

## 2019-10-21 DIAGNOSIS — Z803 Family history of malignant neoplasm of breast: Secondary | ICD-10-CM | POA: Diagnosis not present

## 2019-10-21 DIAGNOSIS — D75839 Thrombocytosis, unspecified: Secondary | ICD-10-CM | POA: Diagnosis present

## 2019-10-21 DIAGNOSIS — A419 Sepsis, unspecified organism: Secondary | ICD-10-CM | POA: Diagnosis present

## 2019-10-21 DIAGNOSIS — L97829 Non-pressure chronic ulcer of other part of left lower leg with unspecified severity: Secondary | ICD-10-CM | POA: Diagnosis present

## 2019-10-21 LAB — CBC WITH DIFFERENTIAL/PLATELET
Abs Immature Granulocytes: 0.08 10*3/uL — ABNORMAL HIGH (ref 0.00–0.07)
Basophils Absolute: 0.1 10*3/uL (ref 0.0–0.1)
Basophils Relative: 0 %
Eosinophils Absolute: 0.4 10*3/uL (ref 0.0–0.5)
Eosinophils Relative: 2 %
HCT: 33 % — ABNORMAL LOW (ref 36.0–46.0)
Hemoglobin: 10.7 g/dL — ABNORMAL LOW (ref 12.0–15.0)
Immature Granulocytes: 1 %
Lymphocytes Relative: 14 %
Lymphs Abs: 2.1 10*3/uL (ref 0.7–4.0)
MCH: 28.8 pg (ref 26.0–34.0)
MCHC: 32.4 g/dL (ref 30.0–36.0)
MCV: 88.9 fL (ref 80.0–100.0)
Monocytes Absolute: 1.2 10*3/uL — ABNORMAL HIGH (ref 0.1–1.0)
Monocytes Relative: 8 %
Neutro Abs: 11.4 10*3/uL — ABNORMAL HIGH (ref 1.7–7.7)
Neutrophils Relative %: 75 %
Platelets: 441 10*3/uL — ABNORMAL HIGH (ref 150–400)
RBC: 3.71 MIL/uL — ABNORMAL LOW (ref 3.87–5.11)
RDW: 14.5 % (ref 11.5–15.5)
WBC: 15.3 10*3/uL — ABNORMAL HIGH (ref 4.0–10.5)
nRBC: 0.1 % (ref 0.0–0.2)

## 2019-10-21 LAB — COMPREHENSIVE METABOLIC PANEL
ALT: 24 U/L (ref 0–44)
AST: 23 U/L (ref 15–41)
Albumin: 2.6 g/dL — ABNORMAL LOW (ref 3.5–5.0)
Alkaline Phosphatase: 153 U/L — ABNORMAL HIGH (ref 38–126)
Anion gap: 8 (ref 5–15)
BUN: 10 mg/dL (ref 6–20)
CO2: 27 mmol/L (ref 22–32)
Calcium: 9 mg/dL (ref 8.9–10.3)
Chloride: 97 mmol/L — ABNORMAL LOW (ref 98–111)
Creatinine, Ser: 0.65 mg/dL (ref 0.44–1.00)
GFR calc Af Amer: >60 mL/min (ref >60)
GFR calc non Af Amer: >60 mL/min (ref >60)
Glucose, Bld: 104 mg/dL — ABNORMAL HIGH (ref 70–99)
Potassium: 4.6 mmol/L (ref 3.5–5.1)
Sodium: 132 mmol/L — ABNORMAL LOW (ref 135–145)
Total Bilirubin: 0.4 mg/dL (ref 0.3–1.2)
Total Protein: 9.1 g/dL — ABNORMAL HIGH (ref 6.5–8.1)

## 2019-10-21 LAB — URINALYSIS, ROUTINE W REFLEX MICROSCOPIC
Bilirubin Urine: NEGATIVE
Glucose, UA: NEGATIVE mg/dL
Hgb urine dipstick: NEGATIVE
Ketones, ur: NEGATIVE mg/dL
Leukocytes,Ua: NEGATIVE
Nitrite: NEGATIVE
Protein, ur: NEGATIVE mg/dL
Specific Gravity, Urine: 1.01 (ref 1.005–1.030)
pH: 8.5 — ABNORMAL HIGH (ref 5.0–8.0)

## 2019-10-21 LAB — PROTIME-INR
INR: 1.1 (ref 0.8–1.2)
Prothrombin Time: 13.5 seconds (ref 11.4–15.2)

## 2019-10-21 LAB — URINE CULTURE

## 2019-10-21 LAB — RESPIRATORY PANEL BY RT PCR (FLU A&B, COVID)
Influenza A by PCR: NEGATIVE
Influenza B by PCR: NEGATIVE
SARS Coronavirus 2 by RT PCR: NEGATIVE

## 2019-10-21 LAB — RAPID URINE DRUG SCREEN, HOSP PERFORMED
Amphetamines: POSITIVE — AB
Barbiturates: NOT DETECTED
Benzodiazepines: POSITIVE — AB
Cocaine: NOT DETECTED
Opiates: NOT DETECTED
Tetrahydrocannabinol: NOT DETECTED

## 2019-10-21 LAB — CK: Total CK: 28 U/L — ABNORMAL LOW (ref 38–234)

## 2019-10-21 LAB — APTT: aPTT: 30 seconds (ref 24–36)

## 2019-10-21 LAB — SURGICAL PCR SCREEN
MRSA, PCR: NEGATIVE
Staphylococcus aureus: NEGATIVE

## 2019-10-21 LAB — C-REACTIVE PROTEIN: CRP: 18.8 mg/dL — ABNORMAL HIGH (ref <1.0)

## 2019-10-21 LAB — LACTIC ACID, PLASMA: Lactic Acid, Venous: 1.6 mmol/L (ref 0.5–1.9)

## 2019-10-21 MED ORDER — CEFAZOLIN SODIUM-DEXTROSE 2-4 GM/100ML-% IV SOLN: 2.0000 g | INTRAVENOUS | Status: DC

## 2019-10-21 MED ORDER — MORPHINE SULFATE (PF) 2 MG/ML IV SOLN
1.0000 mg | INTRAVENOUS | Status: DC | PRN
  Administered 2019-10-21 (×5): 1 mg via INTRAVENOUS
  Filled 2019-10-21 (×5): qty 1

## 2019-10-21 MED ORDER — ACETAMINOPHEN 650 MG RE SUPP: 650.0000 mg | Freq: Four times a day (QID) | RECTAL | Status: DC | PRN

## 2019-10-21 MED ORDER — CHLORHEXIDINE GLUCONATE 4 % EX LIQD
60.0000 mL | Freq: Once | CUTANEOUS | Status: AC
  Administered 2019-10-22: 4 via TOPICAL
  Filled 2019-10-21: qty 60

## 2019-10-21 MED ORDER — SODIUM CHLORIDE 0.9 % IV SOLN
2.0000 g | Freq: Three times a day (TID) | INTRAVENOUS | Status: DC
  Administered 2019-10-21: 2 g via INTRAVENOUS
  Filled 2019-10-21: qty 2

## 2019-10-21 MED ORDER — SODIUM CHLORIDE 0.9 % IV SOLN
INTRAVENOUS | Status: DC | PRN
  Administered 2019-10-21 – 2019-10-24 (×2): 250 mL via INTRAVENOUS

## 2019-10-21 MED ORDER — LACTATED RINGERS IV BOLUS (SEPSIS)
1000.0000 mL | Freq: Once | INTRAVENOUS | Status: AC
  Administered 2019-10-21: 1000 mL via INTRAVENOUS

## 2019-10-21 MED ORDER — OXYCODONE-ACETAMINOPHEN 5-325 MG PO TABS
1.0000 | ORAL_TABLET | Freq: Once | ORAL | Status: AC
  Administered 2019-10-21: 1 via ORAL
  Filled 2019-10-21: qty 1

## 2019-10-21 MED ORDER — SODIUM CHLORIDE 0.9 % IV SOLN
2.0000 g | Freq: Three times a day (TID) | INTRAVENOUS | Status: DC
  Administered 2019-10-21 – 2019-10-25 (×13): 2 g via INTRAVENOUS
  Filled 2019-10-21 (×13): qty 2

## 2019-10-21 MED ORDER — SODIUM CHLORIDE 0.9% FLUSH
10.0000 mL | INTRAVENOUS | Status: DC | PRN
  Administered 2019-10-21: 10 mL

## 2019-10-21 MED ORDER — VANCOMYCIN HCL IN DEXTROSE 1-5 GM/200ML-% IV SOLN
1000.0000 mg | Freq: Once | INTRAVENOUS | Status: AC
  Administered 2019-10-21: 1000 mg via INTRAVENOUS
  Filled 2019-10-21: qty 200

## 2019-10-21 MED ORDER — VANCOMYCIN HCL IN DEXTROSE 1-5 GM/200ML-% IV SOLN
1000.0000 mg | Freq: Three times a day (TID) | INTRAVENOUS | Status: DC
  Administered 2019-10-21 – 2019-10-24 (×8): 1000 mg via INTRAVENOUS
  Filled 2019-10-21 (×9): qty 200

## 2019-10-21 MED ORDER — ACETAMINOPHEN 325 MG PO TABS: 650.0000 mg | ORAL_TABLET | Freq: Four times a day (QID) | ORAL | Status: DC | PRN

## 2019-10-21 MED ORDER — SODIUM CHLORIDE 0.9 % IV BOLUS
1000.0000 mL | Freq: Once | INTRAVENOUS | Status: AC
  Administered 2019-10-21: 1000 mL via INTRAVENOUS

## 2019-10-21 MED ORDER — SODIUM CHLORIDE 0.9% FLUSH
10.0000 mL | Freq: Two times a day (BID) | INTRAVENOUS | Status: DC
  Administered 2019-10-22: 10 mL

## 2019-10-21 MED ORDER — AZTREONAM 2 G IJ SOLR
2.0000 g | Freq: Three times a day (TID) | INTRAMUSCULAR | Status: DC
  Filled 2019-10-21: qty 2

## 2019-10-21 MED ORDER — POVIDONE-IODINE 10 % EX SWAB
2.0000 "application " | Freq: Once | CUTANEOUS | Status: AC
  Administered 2019-10-21: 2 via TOPICAL

## 2019-10-21 MED ORDER — IOHEXOL 300 MG/ML  SOLN
100.0000 mL | Freq: Once | INTRAMUSCULAR | Status: AC | PRN
  Administered 2019-10-21: 100 mL via INTRAVENOUS

## 2019-10-21 NOTE — ED Notes (Signed)
CHanged patients sheet due to urinating on self.

## 2019-10-21 NOTE — H&P (View-Only) (Signed)
   ORTHOPAEDIC CONSULTATION  REQUESTING PHYSICIAN: Ogbata, Sylvester I, MD  Chief Complaint: left leg wound  HPI: [name] [name] is a 39 y.o. female with medical history significant of IVDU, epidural abscess in October 2020, tobacco use, depression, anxiety, GERD presenting with complaints of left leg wound. Orthopedics has been consulted for evaluation.  Patient was sleeping comfortably when I entered the room. She became emotional when I woke her up. She complains of left leg pain and burning. She feels like "Acid" has been poured on her leg. She states the wound started off as a rash that gradually got worse. She denies any bites or injury. She said it has been getting worse back in September and she was unable to walk on her left leg. States 5 days ago was when it started to blister. Her boyfriend has been taking care of the area. When asked what took her so long to get the area checked, she responded in round about ways saying it was too difficult for her to walk and come in. She denies any pain to any other extremity besides her left leg.   History of back surgery. Per chart review from patient's admission in September -October 2020, " MRI of spine due to neurologic deficits which showed approximate 0.7 x 2.2 x 6.8 cm dorsal epidural abscess extending from T7 through T10 with resultant severe spinal stenosis.  Abscess cultures were positive for MRSA. Neurosurgery took the patient to surgery on 9/34 abscess decompression and laminectomy." She states she has had lower extremity difficulty since then and reports decreased sensation in bilateral lower legs since that surgery.   Past Medical History:  Diagnosis Date  . Anxiety    klonipin for stress disorder  . Chronic back pain   . Cocaine abuse ([facility]) 07/06/2013  . Degenerative disc disease   . Endometriosis    chronic pain  . GERD (gastroesophageal reflux disease)    protonix evenings  . Migraine    Past Surgical History:  Procedure  Laterality Date  . ABDOMINAL HYSTERECTOMY    . APPENDECTOMY    . IR THORACENTESIS ASP PLEURAL SPACE W/IMG GUIDE  10/28/2018  . LAMINECTOMY FOR CEREBROSPINAL FLUID LEAK N/A 10/31/2018   Procedure: Thoracic Wound Exploration;  Surgeon: Cabbell, Kyle, MD;  Location: [facility];  Service: Neurosurgery;  Laterality: N/A;  . LAPAROSCOPIC ASSISTED VAGINAL HYSTERECTOMY  08/19/2011   Procedure: LAPAROSCOPIC ASSISTED VAGINAL HYSTERECTOMY;  Surgeon: Allan Ross, MD;  Location: [facility];  Service: Gynecology;  Laterality: N/A;  . TEE WITHOUT CARDIOVERSION N/A 10/25/2018   Procedure: TRANSESOPHAGEAL ECHOCARDIOGRAM (TEE);  Surgeon: Nishan, Peter C, MD;  Location: [facility];  Service: Cardiovascular;  Laterality: N/A;  . THORACIC LAMINECTOMY FOR EPIDURAL ABSCESS N/A 10/20/2018   Procedure: THORACIC Eight, Thoracic nine LAMINECTOMY FOR EPIDURAL ABSCESS;  Surgeon: Jones, David S, MD;  Location: [facility];  Service: Neurosurgery;  Laterality: N/A;  . TONSILLECTOMY    . TUBAL LIGATION     Social History   Socioeconomic History  . Marital status: Divorced    Spouse name: Not on file  . Number of children: Not on file  . Years of education: Not on file  . Highest education level: Not on file  Occupational History  . Not on file  Tobacco Use  . Smoking status: Current Every Day Smoker    Packs/day: 1.00    Years: 14.00    Pack years: 14.00    Types: Cigarettes  . Smokeless tobacco: Never Used  . Tobacco comment:   Smoking 1 PPD  Substance and Sexual Activity  . Alcohol use: No  . Drug use: Yes    Types: Heroin, IV    Comment: heroin  . Sexual activity: Yes  Other Topics Concern  . Not on file  Social History Narrative  . Not on file   Social Determinants of Health   Financial Resource Strain:   . Difficulty of Paying Living Expenses: Not on file  Food Insecurity:   . Worried About Charity fundraiser in the Last Year: Not on file  . Ran Out of Food in the Last Year: Not on file  Transportation Needs:     . Lack of Transportation (Medical): Not on file  . Lack of Transportation (Non-Medical): Not on file  Physical Activity:   . Days of Exercise per Week: Not on file  . Minutes of Exercise per Session: Not on file  Stress:   . Feeling of Stress : Not on file  Social Connections:   . Frequency of Communication with Friends and Family: Not on file  . Frequency of Social Gatherings with Friends and Family: Not on file  . Attends Religious Services: Not on file  . Active Member of Clubs or Organizations: Not on file  . Attends Archivist Meetings: Not on file  . Marital Status: Not on file   Family History  Problem Relation Age of Onset  . Coronary artery disease Father   . Coronary artery disease Maternal Grandmother   . Breast cancer Mother   . Cancer Mother    Allergies  Allergen Reactions  . Epidural Tray 17gx3-1-2" [Nerve Block Tray] Other (See Comments)    Paralysis and severe pain in head/neck/shoulders.  No anaphylaxis.  . Ibuprofen Hives  . Penicillins Hives    Pt has tolerated cephalosporins on past admissions. Has patient had a PCN reaction causing immediate rash, facial/tongue/throat swelling, SOB or lightheadedness with hypotension: Yes Has patient had a PCN reaction causing severe rash involving mucus membranes or skin necrosis: No Has patient had a PCN reaction that required hospitalization No Has patient had a PCN reaction occurring within the last 10 years: No If all of the above answers are "NO", then may proceed with Cephalosporin use.   Marland Kitchen Zofran [Ondansetron Hcl] Nausea And Vomiting   Prior to Admission medications   Medication Sig Start Date End Date Taking? Authorizing Provider  buPROPion (WELLBUTRIN SR) 150 MG 12 hr tablet Take 1 tablet (150 mg total) by mouth daily for 3 days, THEN 1 tablet (150 mg total) 2 (two) times daily for 27 days. Patient not taking: Reported on 10/21/2019 03/18/18 10/20/28  Sid Falcon, MD  escitalopram (LEXAPRO) 20 MG  tablet Take 1.5 tablets (30 mg total) by mouth daily. Patient not taking: Reported on 10/21/2019 04/06/18 10/20/28  Axel Filler, MD  linezolid (ZYVOX) 600 MG tablet Take 1 tablet (600 mg total) by mouth 2 (two) times daily. Patient not taking: Reported on 10/21/2019 11/08/18   Marianna Payment, MD   CT TIBIA FIBULA LEFT W CONTRAST  Result Date: 10/21/2019 CLINICAL DATA:  Wound to the left lower leg. Soft tissue infection is suspected. Worsening since September 4. EXAM: CT OF THE LOWER LEFT EXTREMITY WITH CONTRAST TECHNIQUE: Multidetector CT imaging of the lower right extremity was performed according to the standard protocol following intravenous contrast administration. COMPARISON:  Left knee radiographs 09/13/2005 CONTRAST:  182m OMNIPAQUE IOHEXOL 300 MG/ML  SOLN FINDINGS: Bones/Joint/Cartilage Degenerative changes in the left knee. Visualized bones  appear otherwise intact. No focal bone lesion or bone destruction. No cortical erosion or sclerosis to suggest osteomyelitis. No acute fracture or dislocation. Ligaments Suboptimally assessed by CT. Muscles and Tendons Normal appearance of the musculature. No intramuscular mass or hematoma. Soft tissues There is a large skin and subcutaneous defect demonstrated in the anterior lower leg with extension to the deep fascial compartment. Small amount of adjacent subcutaneous gas. Soft tissue thickening and edema throughout. No loculated collections. IMPRESSION: 1. Large skin and subcutaneous defect in the anterior lower leg with extension to the deep fascial compartment. Small amount of adjacent subcutaneous gas. Soft tissue thickening and edema throughout. No loculated collections. 2. Degenerative changes in the left knee. 3. No evidence of osteomyelitis. Electronically Signed   By: William  Stevens M.D.   On: 10/21/2019 05:35   DG Chest Port 1 View  Result Date: 10/21/2019 CLINICAL DATA:  Possible sepsis. EXAM: PORTABLE CHEST 1 VIEW COMPARISON:  November 07, 2018 FINDINGS: Mildly decreased lung volumes are seen which is likely secondary to the degree of patient inspiration. Mild areas of linear atelectasis are noted within the bilateral lung bases. There is no evidence of a pleural effusion or pneumothorax. The heart size and mediastinal contours are within normal limits. The visualized skeletal structures are unremarkable. IMPRESSION: Mild bibasilar linear atelectasis. Electronically Signed   By: Thaddeus  Houston M.D.   On: 10/21/2019 00:57   Family History Reviewed and non-contributory, no pertinent history of problems with bleeding or anesthesia      Review of Systems 14 system ROS conducted and negative except for that noted in HPI   OBJECTIVE  Vitals: Patient Vitals for the past 8 hrs:  BP Temp Temp src Pulse Resp SpO2  10/21/19 1632 106/83 98.4 F (36.9 C) Oral 100 20 97 %  10/21/19 1149 102/64 99.7 F (37.6 C) Oral (!) 107 20 96 %  10/21/19 1119 104/76 99.3 F (37.4 C) Oral (!) 101 20 99 %   General: Emotional, crying Cardiovascular: Warm extremities noted Respiratory: No cyanosis, no use of accessory musculature GI: No organomegaly, abdomen is soft and non-tender Skin: Multiple skin lesions throughout her body. Large area of erythema on left side of her chest. Bandages on right side of chest.   Neurologic: Sensation intact distally save for the below mentioned MSK exam Psychiatric: Patient is competent for consent. Emotional. Anxious. Lymphatic: No swelling obvious and reported other than the area involved in the exam below  Extremities  RUE + LUE: actively moves upper extremities without pain. Non-tender to palpation throughout. NVI.  RLE: actively moves RLE without pain. Non-tender to palpation right hip, knee, ankle, foot. NVI LLE: Non-tender to palpation left hip, thigh, knee or foot. Becomes extremely anxious and hysterical when trying to examine her wound. Complains of severe pain in her lower leg. Worse just proximal  to her ankle. Performs active ROM of ankle and knee. Significant wound on the anterior aspect of her left lower leg. Evidence of purulent drainage. Surrounding erythema. (Pictures in media which were obtained in the ED) Endorses distal sensation, but say sensory function has been affected since back surgery. Warm well perfused foot.     Test Results Imaging CT left tib/fib showing:  1. Large skin and subcutaneous defect in the anterior lower leg with extension to the deep fascial compartment. Small amount of adjacent subcutaneous gas. Soft tissue thickening and edema throughout. No loculated collections. 2. Degenerative changes in the left knee. 3. No evidence of osteomyelitis.  Labs cbc   Recent Labs    10/21/19 0031  WBC 15.3*  HGB 10.7*  HCT 33.0*  PLT 441*    Labs inflam No results for input(s): CRP in the last 72 hours.  Invalid input(s): ESR  Labs coag Recent Labs    10/21/19 0031  INR 1.1    Recent Labs    10/21/19 0031  NA 132*  K 4.6  CL 97*  CO2 27  GLUCOSE 104*  BUN 10  CREATININE 0.65  CALCIUM 9.0     ASSESSMENT AND PLAN: 39 y.o. female with the following: Severe left lower leg wound  Orthopedics recommends admission to a medical service and we will provide consultation and follow along. Patient has been started on IV antibiotics. She is being worked up for sepsis secondary to her infection and cellulitis.   - Plan: Urgent surgical debridement.  - Procedures: Left wound irrigation and debridement tomorrow morning with Dr. Murphy.  - NPO at midnight - Weight Bearing Status/Activity: NWB - Additional recommended labs/tests: CRP - VTE Prophylaxis: per medicine - Pain control: per medicine  The risks benefits and alternatives were discussed with the patient including but not limited to the risks of nonoperative treatment, versus surgical intervention including infection, bleeding, nerve injury, the need for additional surgery, blood clots,  cardiopulmonary complications, morbidity, mortality, among others, and they were willing to proceed. We had a long discussed about the severity of her infection - she will likely require multiple surgeries, a long hospital stay, and possibly loss of limb. Patient states her understanding. Agrees to proceed with debridement tomorrow morning.  Tiffnay Bossi, PA-C 10/21/2019     

## 2019-10-21 NOTE — ED Notes (Signed)
IV team at bedside to attempt midline since lab and another RN attempted to get blood.

## 2019-10-21 NOTE — Consult Note (Signed)
ORTHOPAEDIC CONSULTATION  REQUESTING PHYSICIAN: Bonnell Public, MD  Chief Complaint: left leg wound  HPI: [name] [name] is a 39 y.o. female with medical history significant of IVDU, epidural abscess in October 2020, tobacco use, depression, anxiety, GERD presenting with complaints of left leg wound. Orthopedics has been consulted for evaluation.  Patient was sleeping comfortably when I entered the room. She became emotional when I woke her up. She complains of left leg pain and burning. She feels like "Acid" has been poured on her leg. She states the wound started off as a rash that gradually got worse. She denies any bites or injury. She said it has been getting worse back in September and she was unable to walk on her left leg. States 5 days ago was when it started to blister. Her boyfriend has been taking care of the area. When asked what took her so long to get the area checked, she responded in round about ways saying it was too difficult for her to walk and come in. She denies any pain to any other extremity besides her left leg.   History of back surgery. Per chart review from patient's admission in September -October 2020, " MRI of spine due to neurologic deficits which showed approximate 0.7 x 2.2 x 6.8 cm dorsal epidural abscess extending from T7 through T10 with resultant severe spinal stenosis.  Abscess cultures were positive for MRSA. Neurosurgery took the patient to surgery on 9/34 abscess decompression and laminectomy." She states she has had lower extremity difficulty since then and reports decreased sensation in bilateral lower legs since that surgery.   Past Medical History:  Diagnosis Date  . Anxiety    klonipin for stress disorder  . Chronic back pain   . Cocaine abuse ([place]) 07/06/2013  . Degenerative disc disease   . Endometriosis    chronic pain  . GERD (gastroesophageal reflux disease)    protonix evenings  . Migraine    Past Surgical History:  Procedure  Laterality Date  . ABDOMINAL HYSTERECTOMY    . APPENDECTOMY    . IR THORACENTESIS ASP PLEURAL SPACE W/IMG GUIDE  10/28/2018  . LAMINECTOMY FOR CEREBROSPINAL FLUID LEAK N/A 10/31/2018   Procedure: Thoracic Wound Exploration;  Surgeon: Ashok Pall, MD;  Location: [facility];  Service: Neurosurgery;  Laterality: N/A;  . LAPAROSCOPIC ASSISTED VAGINAL HYSTERECTOMY  08/19/2011   Procedure: LAPAROSCOPIC ASSISTED VAGINAL HYSTERECTOMY;  Surgeon: Gus Height, MD;  Location: [facility];  Service: Gynecology;  Laterality: N/A;  . TEE WITHOUT CARDIOVERSION N/A 10/25/2018   Procedure: TRANSESOPHAGEAL ECHOCARDIOGRAM (TEE);  Surgeon: Josue Hector, MD;  Location: [facility];  Service: Cardiovascular;  Laterality: N/A;  . THORACIC LAMINECTOMY FOR EPIDURAL ABSCESS N/A 10/20/2018   Procedure: THORACIC Eight, Thoracic nine LAMINECTOMY FOR EPIDURAL ABSCESS;  Surgeon: Eustace Moore, MD;  Location: [facility];  Service: Neurosurgery;  Laterality: N/A;  . TONSILLECTOMY    . TUBAL LIGATION     Social History   Socioeconomic History  . Marital status: Divorced    Spouse name: Not on file  . Number of children: Not on file  . Years of education: Not on file  . Highest education level: Not on file  Occupational History  . Not on file  Tobacco Use  . Smoking status: Current Every Day Smoker    Packs/day: 1.00    Years: 14.00    Pack years: 14.00    Types: Cigarettes  . Smokeless tobacco: Never Used  . Tobacco comment:  Smoking 1 PPD  Substance and Sexual Activity  . Alcohol use: No  . Drug use: Yes    Types: Heroin, IV    Comment: heroin  . Sexual activity: Yes  Other Topics Concern  . Not on file  Social History Narrative  . Not on file   Social Determinants of Health   Financial Resource Strain:   . Difficulty of Paying Living Expenses: Not on file  Food Insecurity:   . Worried About Charity fundraiser in the Last Year: Not on file  . Ran Out of Food in the Last Year: Not on file  Transportation Needs:     . Lack of Transportation (Medical): Not on file  . Lack of Transportation (Non-Medical): Not on file  Physical Activity:   . Days of Exercise per Week: Not on file  . Minutes of Exercise per Session: Not on file  Stress:   . Feeling of Stress : Not on file  Social Connections:   . Frequency of Communication with Friends and Family: Not on file  . Frequency of Social Gatherings with Friends and Family: Not on file  . Attends Religious Services: Not on file  . Active Member of Clubs or Organizations: Not on file  . Attends Archivist Meetings: Not on file  . Marital Status: Not on file   Family History  Problem Relation Age of Onset  . Coronary artery disease Father   . Coronary artery disease Maternal Grandmother   . Breast cancer Mother   . Cancer Mother    Allergies  Allergen Reactions  . Epidural Tray 17gx3-1-2" [Nerve Block Tray] Other (See Comments)    Paralysis and severe pain in head/neck/shoulders.  No anaphylaxis.  . Ibuprofen Hives  . Penicillins Hives    Pt has tolerated cephalosporins on past admissions. Has patient had a PCN reaction causing immediate rash, facial/tongue/throat swelling, SOB or lightheadedness with hypotension: Yes Has patient had a PCN reaction causing severe rash involving mucus membranes or skin necrosis: No Has patient had a PCN reaction that required hospitalization No Has patient had a PCN reaction occurring within the last 10 years: No If all of the above answers are "NO", then may proceed with Cephalosporin use.   Marland Kitchen Zofran [Ondansetron Hcl] Nausea And Vomiting   Prior to Admission medications   Medication Sig Start Date End Date Taking? Authorizing Provider  buPROPion (WELLBUTRIN SR) 150 MG 12 hr tablet Take 1 tablet (150 mg total) by mouth daily for 3 days, THEN 1 tablet (150 mg total) 2 (two) times daily for 27 days. Patient not taking: Reported on 10/21/2019 03/18/18 10/20/28  Sid Falcon, MD  escitalopram (LEXAPRO) 20 MG  tablet Take 1.5 tablets (30 mg total) by mouth daily. Patient not taking: Reported on 10/21/2019 04/06/18 10/20/28  Axel Filler, MD  linezolid (ZYVOX) 600 MG tablet Take 1 tablet (600 mg total) by mouth 2 (two) times daily. Patient not taking: Reported on 10/21/2019 11/08/18   Marianna Payment, MD   CT TIBIA FIBULA LEFT W CONTRAST  Result Date: 10/21/2019 CLINICAL DATA:  Wound to the left lower leg. Soft tissue infection is suspected. Worsening since September 4. EXAM: CT OF THE LOWER LEFT EXTREMITY WITH CONTRAST TECHNIQUE: Multidetector CT imaging of the lower right extremity was performed according to the standard protocol following intravenous contrast administration. COMPARISON:  Left knee radiographs 09/13/2005 CONTRAST:  163m OMNIPAQUE IOHEXOL 300 MG/ML  SOLN FINDINGS: Bones/Joint/Cartilage Degenerative changes in the left knee. Visualized bones  appear otherwise intact. No focal bone lesion or bone destruction. No cortical erosion or sclerosis to suggest osteomyelitis. No acute fracture or dislocation. Ligaments Suboptimally assessed by CT. Muscles and Tendons Normal appearance of the musculature. No intramuscular mass or hematoma. Soft tissues There is a large skin and subcutaneous defect demonstrated in the anterior lower leg with extension to the deep fascial compartment. Small amount of adjacent subcutaneous gas. Soft tissue thickening and edema throughout. No loculated collections. IMPRESSION: 1. Large skin and subcutaneous defect in the anterior lower leg with extension to the deep fascial compartment. Small amount of adjacent subcutaneous gas. Soft tissue thickening and edema throughout. No loculated collections. 2. Degenerative changes in the left knee. 3. No evidence of osteomyelitis. Electronically Signed   By: William  Stevens M.D.   On: 10/21/2019 05:35   DG Chest Port 1 View  Result Date: 10/21/2019 CLINICAL DATA:  Possible sepsis. EXAM: PORTABLE CHEST 1 VIEW COMPARISON:  November 07, 2018 FINDINGS: Mildly decreased lung volumes are seen which is likely secondary to the degree of patient inspiration. Mild areas of linear atelectasis are noted within the bilateral lung bases. There is no evidence of a pleural effusion or pneumothorax. The heart size and mediastinal contours are within normal limits. The visualized skeletal structures are unremarkable. IMPRESSION: Mild bibasilar linear atelectasis. Electronically Signed   By: Thaddeus  Houston M.D.   On: 10/21/2019 00:57   Family History Reviewed and non-contributory, no pertinent history of problems with bleeding or anesthesia      Review of Systems 14 system ROS conducted and negative except for that noted in HPI   OBJECTIVE  Vitals: Patient Vitals for the past 8 hrs:  BP Temp Temp src Pulse Resp SpO2  10/21/19 1632 106/83 98.4 F (36.9 C) Oral 100 20 97 %  10/21/19 1149 102/64 99.7 F (37.6 C) Oral (!) 107 20 96 %  10/21/19 1119 104/76 99.3 F (37.4 C) Oral (!) 101 20 99 %   General: Emotional, crying Cardiovascular: Warm extremities noted Respiratory: No cyanosis, no use of accessory musculature GI: No organomegaly, abdomen is soft and non-tender Skin: Multiple skin lesions throughout her body. Large area of erythema on left side of her chest. Bandages on right side of chest.   Neurologic: Sensation intact distally save for the below mentioned MSK exam Psychiatric: Patient is competent for consent. Emotional. Anxious. Lymphatic: No swelling obvious and reported other than the area involved in the exam below  Extremities  RUE + LUE: actively moves upper extremities without pain. Non-tender to palpation throughout. NVI.  RLE: actively moves RLE without pain. Non-tender to palpation right hip, knee, ankle, foot. NVI LLE: Non-tender to palpation left hip, thigh, knee or foot. Becomes extremely anxious and hysterical when trying to examine her wound. Complains of severe pain in her lower leg. Worse just proximal  to her ankle. Performs active ROM of ankle and knee. Significant wound on the anterior aspect of her left lower leg. Evidence of purulent drainage. Surrounding erythema. (Pictures in media which were obtained in the ED) Endorses distal sensation, but say sensory function has been affected since back surgery. Warm well perfused foot.     Test Results Imaging CT left tib/fib showing:  1. Large skin and subcutaneous defect in the anterior lower leg with extension to the deep fascial compartment. Small amount of adjacent subcutaneous gas. Soft tissue thickening and edema throughout. No loculated collections. 2. Degenerative changes in the left knee. 3. No evidence of osteomyelitis.  Labs cbc   Recent Labs    10/21/19 0031  WBC 15.3*  HGB 10.7*  HCT 33.0*  PLT 441*    Labs inflam No results for input(s): CRP in the last 72 hours.  Invalid input(s): ESR  Labs coag Recent Labs    10/21/19 0031  INR 1.1    Recent Labs    10/21/19 0031  NA 132*  K 4.6  CL 97*  CO2 27  GLUCOSE 104*  BUN 10  CREATININE 0.65  CALCIUM 9.0     ASSESSMENT AND PLAN: 39 y.o. female with the following: Severe left lower leg wound  Orthopedics recommends admission to a medical service and we will provide consultation and follow along. Patient has been started on IV antibiotics. She is being worked up for sepsis secondary to her infection and cellulitis.   - Plan: Urgent surgical debridement.  - Procedures: Left wound irrigation and debridement tomorrow morning with Dr. Percell Miller.  - NPO at midnight - Weight Bearing Status/Activity: NWB - Additional recommended labs/tests: CRP - VTE Prophylaxis: per medicine - Pain control: per medicine  The risks benefits and alternatives were discussed with the patient including but not limited to the risks of nonoperative treatment, versus surgical intervention including infection, bleeding, nerve injury, the need for additional surgery, blood clots,  cardiopulmonary complications, morbidity, mortality, among others, and they were willing to proceed. We had a long discussed about the severity of her infection - she will likely require multiple surgeries, a long hospital stay, and possibly loss of limb. Patient states her understanding. Agrees to proceed with debridement tomorrow morning.  Noemi Chapel, PA-C 10/21/2019

## 2019-10-21 NOTE — ED Notes (Signed)
Wound culture obtained and sent to lab.  Wet to dry dressing with sterile saline applied with abd pads and kerlix to left lower leg.

## 2019-10-21 NOTE — Progress Notes (Signed)
Brief note: -Patient was admitted earlier today. -As per high H&P done by Dr. Shela Leff:  [name] [name] is a 39 y.o. female with medical history significant of IVDU, epidural abscess in October 2020, tobacco use, depression, anxiety, GERD presenting with complaints of left leg wound.  Patient was somnolent in route to the hospital and admitted to heroin and Xanax use.  She was sleeping comfortably and in no distress when I entered the room but after waking up started crying and screaming.  It was difficult to obtain a thorough history from her.  Reported having severe burning pain in her left leg.  She thinks she had an ulcer at the beginning of September which has been growing.  No additional history could be obtained from her.  ED Course: Afebrile.  Tachycardic.  WBC 15.3, hemoglobin 10.7 (at baseline), hematocrit 33, platelet 441K.  Sodium 132, potassium 4.6, chloride 97, bicarb 27, BUN 10, creatinine 0.6, glucose 104.  Lactic acid normal.  INR 1.1.  SARS-CoV-2 PCR test negative.  Influenza panel negative.  UDS pending.  Beta hCG pending.  UA and urine culture pending.  Wound culture pending.  Blood culture x2 pending.  Chest x-ray not suggestive of pneumonia.  Patient was given vancomycin, aztreonam, oxycodone, and 1 L LR bolus. -Orthopedic team has been consulted. -For surgery tomorrow. -Patient has been counseled to quit illicit drug use. -Further management will depend on hospital course.

## 2019-10-21 NOTE — ED Notes (Signed)
Patient placed on bedpan.

## 2019-10-21 NOTE — ED Notes (Signed)
Pt complaining of pain with midline IV. Swelling noted distal to site. Pt stopped complaining shortly after assessment.

## 2019-10-21 NOTE — ED Notes (Signed)
Transported to CT 

## 2019-10-21 NOTE — ED Notes (Signed)
Patient is resting comfortably. 

## 2019-10-21 NOTE — Progress Notes (Addendum)
Pharmacy Antibiotic Note  [name] [name] is a 39 y.o. female admitted on 10/20/2019 with left leg ulcer.  Pharmacy has been consulted for Cefepime and Vancomycin dosing.  Plan: Cefepime 2gm IV q8h Vancomycin 1000mg  IV q8h Will f/u renal function, micro data, and pt's clinical condition Vanc levels prn   Height: 5\' 2"  (157.5 cm) Weight: 83 kg (182 lb 15.7 oz) IBW/kg (Calculated) : 50.1  Temp (24hrs), Avg:99.2 F (37.3 C), Min:99.2 F (37.3 C), Max:99.2 F (37.3 C)  Recent Labs  Lab 10/21/19 0031  WBC 15.3*  CREATININE 0.65  LATICACIDVEN 1.6    Estimated Creatinine Clearance: 92.5 mL/min (by C-G formula based on SCr of 0.65 mg/dL).    Allergies  Allergen Reactions  . Epidural Tray 17gx3-1-2" [Nerve Block Tray] Other (See Comments)    Paralysis and severe pain in head/neck/shoulders.  No anaphylaxis.  . Ibuprofen Hives  . Penicillins Hives    Pt has tolerated cephalosporins on past admissions. Has patient had a PCN reaction causing immediate rash, facial/tongue/throat swelling, SOB or lightheadedness with hypotension: Yes Has patient had a PCN reaction causing severe rash involving mucus membranes or skin necrosis: No Has patient had a PCN reaction that required hospitalization No Has patient had a PCN reaction occurring within the last 10 years: No If all of the above answers are "NO", then may proceed with Cephalosporin use.   Marland Kitchen Zofran [Ondansetron Hcl] Nausea And Vomiting    Antimicrobials this admission: 10/1 Aztreonam x 1 10/1 Cefepime >>  10/1 Vancomycin >>  Microbiology results: 10/1 BCx:  10/1 L leg wound:  UCx:  Thank you for allowing pharmacy to be a part of this patient's care.  Sherlon Handing, PharmD, BCPS Please see amion for complete clinical pharmacist phone list 10/21/2019 4:03 AM

## 2019-10-21 NOTE — H&P (Addendum)
History and Physical    [name] [name] NID:[id number] DOB: 29-Oct-1980 DOA: 10/20/2019  PCP: [name], MD Patient coming from: Home  Chief Complaint: Left leg wound  HPI: [name] [name] is a 39 y.o. female with medical history significant of IVDU, epidural abscess in October 2020, tobacco use, depression, anxiety, GERD presenting with complaints of left leg wound.  Patient was somnolent in route to the hospital and admitted to heroin and Xanax use.  She was sleeping comfortably and in no distress when I entered the room but after waking up started crying and screaming.  It was difficult to obtain a thorough history from her.  Reported having severe burning pain in her left leg.  She thinks she had an ulcer at the beginning of September which has been growing.  No additional history could be obtained from her.  ED Course: Afebrile.  Tachycardic.  WBC 15.3, hemoglobin 10.7 (at baseline), hematocrit 33, platelet 441K.  Sodium 132, potassium 4.6, chloride 97, bicarb 27, BUN 10, creatinine 0.6, glucose 104.  Lactic acid normal.  INR 1.1.  SARS-CoV-2 PCR test negative.  Influenza panel negative.  UDS pending.  Beta hCG pending.  UA and urine culture pending.  Wound culture pending.  Blood culture x2 pending.  Chest x-ray not suggestive of pneumonia.  Patient was given vancomycin, aztreonam, oxycodone, and 1 L LR bolus.  Review of Systems:  All systems reviewed and apart from history of presenting illness, are negative.  Past Medical History:  Diagnosis Date  . Anxiety    klonipin for stress disorder  . Chronic back pain   . Cocaine abuse ([place]) 07/06/2013  . Degenerative disc disease   . Endometriosis    chronic pain  . GERD (gastroesophageal reflux disease)    protonix evenings  . Migraine     Past Surgical History:  Procedure Laterality Date  . ABDOMINAL HYSTERECTOMY    . APPENDECTOMY    . IR THORACENTESIS ASP PLEURAL SPACE W/IMG GUIDE  10/28/2018  . LAMINECTOMY FOR CEREBROSPINAL  FLUID LEAK N/A 10/31/2018   Procedure: Thoracic Wound Exploration;  Surgeon: Ashok Pall, MD;  Location: [place];  Service: Neurosurgery;  Laterality: N/A;  . LAPAROSCOPIC ASSISTED VAGINAL HYSTERECTOMY  08/19/2011   Procedure: LAPAROSCOPIC ASSISTED VAGINAL HYSTERECTOMY;  Surgeon: Gus Height, MD;  Location: [facility];  Service: Gynecology;  Laterality: N/A;  . TEE WITHOUT CARDIOVERSION N/A 10/25/2018   Procedure: TRANSESOPHAGEAL ECHOCARDIOGRAM (TEE);  Surgeon: Josue Hector, MD;  Location: [place];  Service: Cardiovascular;  Laterality: N/A;  . THORACIC LAMINECTOMY FOR EPIDURAL ABSCESS N/A 10/20/2018   Procedure: THORACIC Eight, Thoracic nine LAMINECTOMY FOR EPIDURAL ABSCESS;  Surgeon: Eustace Moore, MD;  Location: [place];  Service: Neurosurgery;  Laterality: N/A;  . TONSILLECTOMY    . TUBAL LIGATION       reports that she has been smoking cigarettes. She has a 14.00 pack-year smoking history. She has never used smokeless tobacco. She reports current drug use. Drugs: Heroin and IV. She reports that she does not drink alcohol.  Allergies  Allergen Reactions  . Epidural Tray 17gx3-1-2" [Nerve Block Tray] Other (See Comments)    Paralysis and severe pain in head/neck/shoulders.  No anaphylaxis.  . Ibuprofen Hives  . Penicillins Hives    Pt has tolerated cephalosporins on past admissions. Has patient had a PCN reaction causing immediate rash, facial/tongue/throat swelling, SOB or lightheadedness with hypotension: Yes Has patient had a PCN reaction causing severe rash involving mucus membranes or skin necrosis: No Has patient  had a PCN reaction that required hospitalization No Has patient had a PCN reaction occurring within the last 10 years: No If all of the above answers are "NO", then may proceed with Cephalosporin use.   Marland Kitchen Zofran [Ondansetron Hcl] Nausea And Vomiting    Family History  Problem Relation Age of Onset  . Coronary artery disease Father   . Coronary artery disease Maternal  Grandmother   . Breast cancer Mother   . Cancer Mother     Prior to Admission medications   Medication Sig Start Date End Date Taking? Authorizing Provider  buPROPion (WELLBUTRIN SR) 150 MG 12 hr tablet Take 1 tablet (150 mg total) by mouth daily for 3 days, THEN 1 tablet (150 mg total) 2 (two) times daily for 27 days. Patient not taking: Reported on 04/10/2018 03/18/18 04/17/18  Sid Falcon, MD  escitalopram (LEXAPRO) 20 MG tablet Take 1.5 tablets (30 mg total) by mouth daily. Patient not taking: Reported on 10/20/2018 04/06/18 07/05/18  Axel Filler, MD  linezolid (ZYVOX) 600 MG tablet Take 1 tablet (600 mg total) by mouth 2 (two) times daily. 11/08/18   Marianna Payment, MD    Physical Exam: Vitals:   10/21/19 0215 10/21/19 0230 10/21/19 0300 10/21/19 0330  BP: 116/86 127/88 96/75 111/80  Pulse: (!) 118 (!) 117 (!) 115 (!) 113  Resp: (!) 21 19 (!) 25 (!) 26  Temp:      TempSrc:      SpO2: 98% 96% 97% 100%  Weight:      Height:        Physical Exam Constitutional:      General: She is not in acute distress. HENT:     Head: Normocephalic and atraumatic.  Eyes:     Conjunctiva/sclera: Conjunctivae normal.  Cardiovascular:     Rate and Rhythm: Regular rhythm. Tachycardia present.     Pulses: Normal pulses.     Comments: Slightly tachycardic Pulmonary:     Effort: Pulmonary effort is normal. No respiratory distress.     Breath sounds: No wheezing or rales.  Abdominal:     General: Bowel sounds are normal. There is no distension.     Palpations: Abdomen is soft.     Tenderness: There is no abdominal tenderness.  Musculoskeletal:     Cervical back: Normal range of motion and neck supple.     Comments: Left chest wall/breast with a large area of erythema and induration.  Area tender to palpation. Large deep open wound on the anterior aspect of the left lower extremity with purulent drainage and erythema of surrounding skin.  Dorsalis pedis pulse strong.  Skin:     General: Skin is warm and dry.  Neurological:     General: No focal deficit present.     Mental Status: She is alert and oriented to person, place, and time.           Labs on Admission: I have personally reviewed following labs and imaging studies  CBC: Recent Labs  Lab 10/21/19 0031  WBC 15.3*  NEUTROABS 11.4*  HGB 10.7*  HCT 33.0*  MCV 88.9  PLT 151*   Basic Metabolic Panel: Recent Labs  Lab 10/21/19 0031  NA 132*  K 4.6  CL 97*  CO2 27  GLUCOSE 104*  BUN 10  CREATININE 0.65  CALCIUM 9.0   GFR: Estimated Creatinine Clearance: 92.5 mL/min (by C-G formula based on SCr of 0.65 mg/dL). Liver Function Tests: Recent Labs  Lab 10/21/19 0031  AST 23  ALT 24  ALKPHOS 153*  BILITOT 0.4  PROT 9.1*  ALBUMIN 2.6*   No results for input(s): LIPASE, AMYLASE in the last 168 hours. No results for input(s): AMMONIA in the last 168 hours. Coagulation Profile: Recent Labs  Lab 10/21/19 0031  INR 1.1   Cardiac Enzymes: No results for input(s): CKTOTAL, CKMB, CKMBINDEX, TROPONINI in the last 168 hours. BNP (last 3 results) No results for input(s): PROBNP in the last 8760 hours. HbA1C: No results for input(s): HGBA1C in the last 72 hours. CBG: No results for input(s): GLUCAP in the last 168 hours. Lipid Profile: No results for input(s): CHOL, HDL, LDLCALC, TRIG, CHOLHDL, LDLDIRECT in the last 72 hours. Thyroid Function Tests: No results for input(s): TSH, T4TOTAL, FREET4, T3FREE, THYROIDAB in the last 72 hours. Anemia Panel: No results for input(s): VITAMINB12, FOLATE, FERRITIN, TIBC, IRON, RETICCTPCT in the last 72 hours. Urine analysis:    Component Value Date/Time   COLORURINE YELLOW 11/07/2018 0239   APPEARANCEUR HAZY (A) 11/07/2018 0239   LABSPEC 1.008 11/07/2018 0239   PHURINE 6.0 11/07/2018 0239   GLUCOSEU NEGATIVE 11/07/2018 0239   HGBUR NEGATIVE 11/07/2018 0239   BILIRUBINUR NEGATIVE 11/07/2018 0239   KETONESUR NEGATIVE 11/07/2018 0239    PROTEINUR NEGATIVE 11/07/2018 0239   UROBILINOGEN 1.0 05/03/2012 2114   NITRITE NEGATIVE 11/07/2018 0239   LEUKOCYTESUR NEGATIVE 11/07/2018 0239    Radiological Exams on Admission: DG Chest Port 1 View  Result Date: 10/21/2019 CLINICAL DATA:  Possible sepsis. EXAM: PORTABLE CHEST 1 VIEW COMPARISON:  November 07, 2018 FINDINGS: Mildly decreased lung volumes are seen which is likely secondary to the degree of patient inspiration. Mild areas of linear atelectasis are noted within the bilateral lung bases. There is no evidence of a pleural effusion or pneumothorax. The heart size and mediastinal contours are within normal limits. The visualized skeletal structures are unremarkable. IMPRESSION: Mild bibasilar linear atelectasis. Electronically Signed   By: Virgina Norfolk M.D.   On: 10/21/2019 00:57    EKG: Independently reviewed.  Sinus tachycardia.  Assessment/Plan Principal Problem:   Wound infection Active Problems:   Depression   Cellulitis   Substance abuse ([place])   Anxiety   Sepsis secondary to left lower leg wound infection and cellulitis: Tachycardic and blood pressure soft.  Labs showing leukocytosis.  Lactic acid normal. -Vancomycin and cefepime.  She has an obvious penicillin allergy listed and has been able to tolerate cephalosporins in the past (confirmed with pharmacy).  Received 1 L fluid bolus, continues to be slightly tachycardic and blood pressure soft.  Will order additional IV fluid bolus.  Pain management.  Wound culture pending.  Blood culture x2 pending.  Check CK level.  Monitor WBC count.  Will order CT due to suspicion for possible necrotizing infection.  Given extent of wound infection, will need surgical debridement.  Please consult surgery in a.m. Keep n.p.o.  Left chest wall/breast cellulitis: Noted to have a large area of induration and erythema with no large drainable abscess. -Continue antibiotics  Substance abuse: Patient admitted to using heroin and  Xanax. -Social work consult, counseling.  UDS pending.  Depression, anxiety -Resume home meds after pharmacy med rec is done  DVT prophylaxis: SCDs Code Status: Full code Family Communication: No family available at this time. Disposition Plan: Status is: Inpatient  Remains inpatient appropriate because:IV treatments appropriate due to intensity of illness or inability to take PO, Inpatient level of care appropriate due to severity of illness and Needs surgical debridement of  wound   Dispo: The patient is from: Home              Anticipated d/c is to: SNF              Anticipated d/c date is: > 3 days              Patient currently is not medically stable to d/c.  The medical decision making on this patient was of high complexity and the patient is at high risk for clinical deterioration, therefore this is a level 3 visit.  Shela Leff MD [facility]  If 7PM-7AM, please contact night-coverage [email or web address]  10/21/2019, 3:58 AM

## 2019-10-21 NOTE — ED Provider Notes (Signed)
[facility] EMERGENCY DEPARTMENT Provider Note   CSN: [id number] Arrival date & time: 10/20/19  2352   History Chief complaint: Leg ulcer  [name] [name] is a 39 y.o. female.  The history is provided by the patient.  She has history of substance abuse and called EMS because of a worsening ulcer of her left lower leg.  She states that it started out as a rash on 9/4 and has been getting progressively worse.  EMS noted patient was sitting in the truck and kept falling asleep.  She does admit to using heroin this afternoon.  Patient is very tearful and it is very difficult to get any accurate history.  Past Medical History:  Diagnosis Date  . Anxiety    klonipin for stress disorder  . Chronic back pain   . Cocaine abuse ([place]) 07/06/2013  . Degenerative disc disease   . Endometriosis    chronic pain  . GERD (gastroesophageal reflux disease)    protonix evenings  . Migraine     Patient Active Problem List   Diagnosis Date Noted  . Fever   . 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  . Paresthesia of both legs   . [name]   . Urinary retention   . 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 IVDU (intravenous drug user)   . Septic embolism ([place])   . Loculated pleural effusion 10/20/2018  . RUQ pain 04/06/2018  . Breast mass, right 10/12/2017  . Neuropathy 07/14/2017  . 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 Opioid use disorder, severe, dependence ([place]) 09/22/2014  . Alcohol use disorder 01/04/2012    Past Surgical History:  Procedure Laterality Date  . ABDOMINAL HYSTERECTOMY    . APPENDECTOMY    . IR THORACENTESIS ASP PLEURAL SPACE W/IMG GUIDE  10/28/2018  . LAMINECTOMY FOR CEREBROSPINAL FLUID LEAK N/A 10/31/2018   Procedure: Thoracic Wound Exploration;  Surgeon: Ashok Pall, MD;  Location: [place];  Service: Neurosurgery;  Laterality: N/A;  . LAPAROSCOPIC ASSISTED VAGINAL HYSTERECTOMY  08/19/2011    Procedure: LAPAROSCOPIC ASSISTED VAGINAL HYSTERECTOMY;  Surgeon: Gus Height, MD;  Location: [facility] [facility];  Service: Gynecology;  Laterality: N/A;  . TEE WITHOUT CARDIOVERSION N/A 10/25/2018   Procedure: TRANSESOPHAGEAL ECHOCARDIOGRAM (TEE);  Surgeon: Josue Hector, MD;  Location: [place];  Service: Cardiovascular;  Laterality: N/A;  . THORACIC LAMINECTOMY FOR EPIDURAL ABSCESS N/A 10/20/2018   Procedure: THORACIC Eight, Thoracic nine LAMINECTOMY FOR EPIDURAL ABSCESS;  Surgeon: Eustace Moore, MD;  Location: [place];  Service: Neurosurgery;  Laterality: N/A;  . TONSILLECTOMY    . TUBAL LIGATION       OB History    Gravida  6   Para  2   Term      Preterm      AB      Living        SAB      TAB      Ectopic      Multiple      Live Births              Family History  Problem Relation Age of Onset  . Coronary artery disease Father   . Coronary artery disease Maternal Grandmother   . Breast cancer Mother   . Cancer Mother     Social History   Tobacco Use  . Smoking status: Current Every Day Smoker    Packs/day: 1.00    Years: 14.00  Pack years: 14.00    Types: Cigarettes  . Smokeless tobacco: Never Used  . Tobacco comment: Smoking 1 PPD  Substance Use Topics  . Alcohol use: No  . Drug use: Yes    Types: Heroin, IV    Comment: heroin    Home Medications Prior to Admission medications   Medication Sig Start Date End Date Taking? Authorizing Provider  buPROPion (WELLBUTRIN SR) 150 MG 12 hr tablet Take 1 tablet (150 mg total) by mouth daily for 3 days, THEN 1 tablet (150 mg total) 2 (two) times daily for 27 days. Patient not taking: Reported on 04/10/2018 03/18/18 04/17/18  Sid Falcon, MD  escitalopram (LEXAPRO) 20 MG tablet Take 1.5 tablets (30 mg total) by mouth daily. Patient not taking: Reported on 10/20/2018 04/06/18 07/05/18  Axel Filler, MD  linezolid (ZYVOX) 600 MG tablet Take 1 tablet (600 mg total) by mouth 2 (two) times daily.  11/08/18   Marianna Payment, MD    Allergies    Epidural tray 17gx3-1-2" [nerve block tray], Ibuprofen, Penicillins, and Zofran [ondansetron hcl]  Review of Systems   Review of Systems  All other systems reviewed and are negative.   Physical Exam Updated Vital Signs BP 124/83 (BP Location: Right Arm)   Pulse (!) 129   Temp 99.2 F (37.3 C) (Oral)   Resp (!) 22   Ht 5\' 2"  (1.575 m)   Wt 83 kg   LMP 08/10/2011   SpO2 100%   BMI 33.47 kg/m   Physical Exam Vitals and nursing note reviewed.   39 year old female, tearful, but in no acute distress. Vital signs are significant for rapid heart rate and slightly elevated respiratory rate. Oxygen saturation is 100%, which is normal. Head is normocephalic and atraumatic. PERRLA, EOMI. Oropharynx is clear. Neck is nontender and supple without adenopathy or JVD. Back is nontender and there is no CVA tenderness. Lungs are clear without rales, wheezes, or rhonchi. Chest is nontender. Heart is tachycardic without murmur. Abdomen is soft, flat, nontender without masses or hepatosplenomegaly and peristalsis is normoactive. Extremities: Deep wound present anterior aspect of left lower leg with gross purulence.  There is erythema of the surrounding skin going down to the foot.  Capillary refill is prompt and sensation normal.  Please see pictures below. Skin is warm and dry without rash. Neurologic: Mental status is normal, cranial nerves are intact, there are no motor or sensory deficits.    ED Results / Procedures / Treatments   Labs (all labs ordered are listed, but only abnormal results are displayed) Labs Reviewed - No data to display  EKG EKG Interpretation  Date/Time:  Friday October 21 2019 00:04:19 EDT Ventricular Rate:  129 PR Interval:    QRS Duration: 91 QT Interval:  303 QTC Calculation: 444 R Axis:   6 Text Interpretation: Sinus tachycardia Low voltage, precordial leads Abnormal R-wave progression, early transition When  compared with ECG of 11/08/2018, No significant change was found Confirmed by Delora Fuel ([id number]) on 10/21/2019 12:11:54 AM   Radiology No results found.  Procedures Procedures  CRITICAL CARE Performed by: Delora Fuel Total critical care time: 50 minutes Critical care time was exclusive of separately billable procedures and treating other patients. Critical care was necessary to treat or prevent imminent or life-threatening deterioration. Critical care was time spent personally by me on the following activities: development of treatment plan with patient and/or surrogate as well as nursing, discussions with consultants, evaluation of patient's response to treatment, examination  of patient, obtaining history from patient or surrogate, ordering and performing treatments and interventions, ordering and review of laboratory studies, ordering and review of radiographic studies, pulse oximetry and re-evaluation of patient's condition.  Medications Ordered in ED Medications  aztreonam (AZACTAM) 2 g in sodium chloride 0.9 % 100 mL IVPB (0 g Intravenous Stopped 10/21/19 0207)  sodium chloride flush (NS) 0.9 % injection 10-40 mL (has no administration in time range)  sodium chloride flush (NS) 0.9 % injection 10-40 mL (has no administration in time range)  lactated ringers bolus 1,000 mL (0 mLs Intravenous Stopped 10/21/19 0302)  vancomycin (VANCOCIN) IVPB 1000 mg/200 mL premix (0 mg Intravenous Stopped 10/21/19 0337)  oxyCODONE-acetaminophen (PERCOCET/ROXICET) 5-325 MG per tablet 1 tablet (1 tablet Oral Given 10/21/19 0210)    ED Course  I have reviewed the triage vital signs and the nursing notes.  Pertinent labs & imaging results that were available during my care of the patient were reviewed by me and considered in my medical decision making (see chart for details).  MDM Rules/Calculators/A&P Purulent wound of the left lower leg with some signs suggestive of sepsis (tachycardia but no fever).   Cultures were obtained and she is started on the evolving sepsis pathway and was started on antibiotics.  Given initial bolus of lactated Ringer's.  Old records are reviewed, and she has no relevant past visits.  Labs do show mild leukocytosis, mild anemia, mild hyponatremia and elevated alkaline phosphatase.  Lactate is normal.  Chest x-ray is unremarkable.  ECG shows sinus tachycardia, low voltage and unchanged from prior.  Case is discussed with Dr. Marlowe Sax of [facility], who agrees to admit the patient.  Final Clinical Impression(s) / ED Diagnoses Final diagnoses:  Skin infection  Normochromic normocytic anemia  Thrombocytosis  Hyponatremia  Alkaline phosphatase elevation    Rx / DC Orders ED Discharge Orders    None       Delora Fuel, MD 90/38/33 [phone]

## 2019-10-21 NOTE — Consult Note (Signed)
I have reviewed her history and imaging.   Plan for surgical debridement of her Left leg wound.   Formal consult to follow.    Renette Butters

## 2019-10-22 ENCOUNTER — Inpatient Hospital Stay (HOSPITAL_COMMUNITY): Payer: Medicaid Other | Admitting: Certified Registered"

## 2019-10-22 ENCOUNTER — Encounter (HOSPITAL_COMMUNITY): Payer: Self-pay | Admitting: Internal Medicine

## 2019-10-22 ENCOUNTER — Encounter (HOSPITAL_COMMUNITY)
Admission: EM | Disposition: A | Discharge status: Home Health | Payer: Self-pay | Source: Home / Self Care | Attending: Internal Medicine

## 2019-10-22 DIAGNOSIS — L089 Local infection of the skin and subcutaneous tissue, unspecified: Secondary | ICD-10-CM | POA: Diagnosis not present

## 2019-10-22 DIAGNOSIS — T148XXA Other injury of unspecified body region, initial encounter: Secondary | ICD-10-CM | POA: Diagnosis not present

## 2019-10-22 HISTORY — PX: I & D EXTREMITY: SHX5045

## 2019-10-22 LAB — CBC
HCT: 29.2 % — ABNORMAL LOW (ref 36.0–46.0)
Hemoglobin: 9.5 g/dL — ABNORMAL LOW (ref 12.0–15.0)
MCH: 28.3 pg (ref 26.0–34.0)
MCHC: 32.5 g/dL (ref 30.0–36.0)
MCV: 86.9 fL (ref 80.0–100.0)
Platelets: 340 K/uL (ref 150–400)
RBC: 3.36 MIL/uL — ABNORMAL LOW (ref 3.87–5.11)
RDW: 14 % (ref 11.5–15.5)
WBC: 10.9 K/uL — ABNORMAL HIGH (ref 4.0–10.5)
nRBC: 0 % (ref 0.0–0.2)

## 2019-10-22 LAB — CREATININE, SERUM
Creatinine, Ser: 0.57 mg/dL (ref 0.44–1.00)
GFR calc Af Amer: >60 mL/min (ref >60)
GFR calc non Af Amer: >60 mL/min (ref >60)

## 2019-10-22 SURGERY — IRRIGATION AND DEBRIDEMENT EXTREMITY
Anesthesia: General | Site: Leg Lower | Laterality: Left

## 2019-10-22 MED ORDER — ACETAMINOPHEN 160 MG/5ML PO SOLN: 325.0000 mg | Freq: Once | ORAL | Status: DC | PRN

## 2019-10-22 MED ORDER — HYDROMORPHONE HCL 1 MG/ML IJ SOLN
0.5000 mg | INTRAMUSCULAR | Status: DC | PRN
  Administered 2019-10-22 – 2019-10-23 (×5): 1 mg via INTRAVENOUS
  Filled 2019-10-22 (×5): qty 1

## 2019-10-22 MED ORDER — CEFAZOLIN SODIUM-DEXTROSE 1-4 GM/50ML-% IV SOLN: 1.0000 g | Freq: Four times a day (QID) | INTRAVENOUS | Status: DC

## 2019-10-22 MED ORDER — METOCLOPRAMIDE HCL 5 MG PO TABS: 5.0000 mg | ORAL_TABLET | Freq: Three times a day (TID) | ORAL | Status: DC | PRN

## 2019-10-22 MED ORDER — HYDROMORPHONE HCL 1 MG/ML IJ SOLN
0.5000 mg | Freq: Once | INTRAMUSCULAR | Status: AC
  Administered 2019-10-22: 0.5 mg via INTRAVENOUS

## 2019-10-22 MED ORDER — DIPHENHYDRAMINE HCL 12.5 MG/5ML PO ELIX
12.5000 mg | ORAL_SOLUTION | ORAL | Status: DC | PRN
  Filled 2019-10-22: qty 10

## 2019-10-22 MED ORDER — FENTANYL CITRATE (PF) 100 MCG/2ML IJ SOLN
INTRAMUSCULAR | Status: DC | PRN
Start: 2019-10-22 — End: 2019-10-22
  Administered 2019-10-22: 100 ug via INTRAVENOUS
  Administered 2019-10-22 (×5): 50 ug via INTRAVENOUS
  Administered 2019-10-22: 100 ug via INTRAVENOUS
  Administered 2019-10-22: 50 ug via INTRAVENOUS

## 2019-10-22 MED ORDER — LACTATED RINGERS IV SOLN: INTRAVENOUS | Status: DC

## 2019-10-22 MED ORDER — MEPERIDINE HCL 25 MG/ML IJ SOLN: 6.2500 mg | INTRAMUSCULAR | Status: DC | PRN

## 2019-10-22 MED ORDER — LIDOCAINE 2% (20 MG/ML) 5 ML SYRINGE
INTRAMUSCULAR | Status: DC | PRN
  Administered 2019-10-22: 50 mg via INTRAVENOUS

## 2019-10-22 MED ORDER — SODIUM CHLORIDE 0.9% FLUSH
10.0000 mL | INTRAVENOUS | Status: DC | PRN
  Administered 2019-10-27: 10 mL

## 2019-10-22 MED ORDER — METHOCARBAMOL 500 MG PO TABS
500.0000 mg | ORAL_TABLET | Freq: Four times a day (QID) | ORAL | Status: DC | PRN
  Administered 2019-10-22: 500 mg via ORAL
  Filled 2019-10-22: qty 1

## 2019-10-22 MED ORDER — DEXMEDETOMIDINE (PRECEDEX) IN NS 20 MCG/5ML (4 MCG/ML) IV SYRINGE
PREFILLED_SYRINGE | INTRAVENOUS | Status: DC | PRN
  Administered 2019-10-22: 12 ug via INTRAVENOUS
  Administered 2019-10-22: 8 ug via INTRAVENOUS

## 2019-10-22 MED ORDER — MIDAZOLAM HCL 2 MG/2ML IJ SOLN
1.0000 mg | Freq: Once | INTRAMUSCULAR | Status: AC
  Administered 2019-10-22: 1 mg via INTRAVENOUS

## 2019-10-22 MED ORDER — ACETAMINOPHEN 325 MG PO TABS: 325.0000 mg | ORAL_TABLET | Freq: Once | ORAL | Status: DC | PRN

## 2019-10-22 MED ORDER — HYDROMORPHONE HCL 1 MG/ML IJ SOLN
INTRAMUSCULAR | Status: AC
Start: 2019-10-22 — End: 2019-10-22
  Filled 2019-10-22: qty 1

## 2019-10-22 MED ORDER — MIDAZOLAM BOLUS VIA INFUSION: 1.0000 mg | Freq: Once | INTRAVENOUS | Status: DC

## 2019-10-22 MED ORDER — ENOXAPARIN SODIUM 40 MG/0.4ML ~~LOC~~ SOLN
40.0000 mg | SUBCUTANEOUS | Status: DC
  Administered 2019-10-23 – 2019-10-25 (×3): 40 mg via SUBCUTANEOUS
  Filled 2019-10-22 (×3): qty 0.4

## 2019-10-22 MED ORDER — SODIUM CHLORIDE 0.9% FLUSH
10.0000 mL | Freq: Two times a day (BID) | INTRAVENOUS | Status: DC
  Administered 2019-10-22 – 2019-10-28 (×9): 10 mL

## 2019-10-22 MED ORDER — KETOROLAC TROMETHAMINE 30 MG/ML IJ SOLN
INTRAMUSCULAR | Status: AC
  Filled 2019-10-22: qty 1

## 2019-10-22 MED ORDER — DIPHENHYDRAMINE HCL 50 MG/ML IJ SOLN
INTRAMUSCULAR | Status: DC | PRN
  Administered 2019-10-22: 12.5 mg via INTRAVENOUS

## 2019-10-22 MED ORDER — MIDAZOLAM HCL 5 MG/5ML IJ SOLN
INTRAMUSCULAR | Status: DC | PRN
  Administered 2019-10-22: 2 mg via INTRAVENOUS

## 2019-10-22 MED ORDER — CHLORHEXIDINE GLUCONATE 0.12 % MT SOLN
15.0000 mL | Freq: Once | OROMUCOSAL | Status: AC
  Administered 2019-10-22: 15 mL via OROMUCOSAL

## 2019-10-22 MED ORDER — HYDROCODONE-ACETAMINOPHEN 7.5-325 MG PO TABS
1.0000 | ORAL_TABLET | Freq: Four times a day (QID) | ORAL | Status: DC | PRN
  Administered 2019-10-23: 1 via ORAL
  Filled 2019-10-22: qty 1

## 2019-10-22 MED ORDER — HYDROMORPHONE HCL 1 MG/ML IJ SOLN
INTRAMUSCULAR | Status: AC
  Filled 2019-10-22: qty 1

## 2019-10-22 MED ORDER — BUSPIRONE HCL 10 MG PO TABS
10.0000 mg | ORAL_TABLET | Freq: Three times a day (TID) | ORAL | Status: DC | PRN
  Administered 2019-10-22 – 2019-10-28 (×5): 10 mg via ORAL
  Filled 2019-10-22 (×5): qty 1

## 2019-10-22 MED ORDER — HYDROMORPHONE HCL 1 MG/ML IJ SOLN
1.0000 mg | INTRAMUSCULAR | Status: AC | PRN
  Administered 2019-10-22 (×3): 1 mg via INTRAVENOUS
  Filled 2019-10-22 (×3): qty 1

## 2019-10-22 MED ORDER — SODIUM CHLORIDE 0.9 % IR SOLN
Status: DC | PRN
  Administered 2019-10-22: 3000 mL

## 2019-10-22 MED ORDER — ACETAMINOPHEN 10 MG/ML IV SOLN: 1000.0000 mg | Freq: Once | INTRAVENOUS | Status: DC | PRN

## 2019-10-22 MED ORDER — METOCLOPRAMIDE HCL 5 MG/ML IJ SOLN: 5.0000 mg | Freq: Three times a day (TID) | INTRAMUSCULAR | Status: DC | PRN

## 2019-10-22 MED ORDER — ALUM & MAG HYDROXIDE-SIMETH 200-200-20 MG/5ML PO SUSP
30.0000 mL | Freq: Four times a day (QID) | ORAL | Status: DC | PRN
  Administered 2019-10-23: 30 mL via ORAL
  Filled 2019-10-22: qty 30

## 2019-10-22 MED ORDER — POLYETHYLENE GLYCOL 3350 17 G PO PACK: 17.0000 g | PACK | Freq: Every day | ORAL | Status: DC | PRN

## 2019-10-22 MED ORDER — PROMETHAZINE HCL 25 MG/ML IJ SOLN
INTRAMUSCULAR | Status: AC
  Filled 2019-10-22: qty 1

## 2019-10-22 MED ORDER — KETOROLAC TROMETHAMINE 30 MG/ML IJ SOLN
30.0000 mg | Freq: Once | INTRAMUSCULAR | Status: AC
  Administered 2019-10-22: 30 mg via INTRAVENOUS

## 2019-10-22 MED ORDER — CELECOXIB 200 MG PO CAPS
200.0000 mg | ORAL_CAPSULE | Freq: Two times a day (BID) | ORAL | Status: DC
  Administered 2019-10-22 – 2019-10-27 (×11): 200 mg via ORAL
  Filled 2019-10-22 (×14): qty 1

## 2019-10-22 MED ORDER — ACETAMINOPHEN 500 MG PO TABS
1000.0000 mg | ORAL_TABLET | Freq: Three times a day (TID) | ORAL | Status: DC
  Administered 2019-10-22 – 2019-10-23 (×3): 1000 mg via ORAL
  Filled 2019-10-22 (×3): qty 2

## 2019-10-22 MED ORDER — METHOCARBAMOL 1000 MG/10ML IJ SOLN
500.0000 mg | Freq: Four times a day (QID) | INTRAVENOUS | Status: DC | PRN
  Filled 2019-10-22: qty 5

## 2019-10-22 MED ORDER — HYDROMORPHONE HCL 1 MG/ML IJ SOLN
0.2500 mg | INTRAMUSCULAR | Status: DC | PRN
  Administered 2019-10-22 (×4): 0.5 mg via INTRAVENOUS

## 2019-10-22 MED ORDER — DOCUSATE SODIUM 100 MG PO CAPS
100.0000 mg | ORAL_CAPSULE | Freq: Two times a day (BID) | ORAL | Status: DC
  Administered 2019-10-22 – 2019-10-26 (×7): 100 mg via ORAL
  Filled 2019-10-22 (×10): qty 1

## 2019-10-22 MED ORDER — GABAPENTIN 100 MG PO CAPS
100.0000 mg | ORAL_CAPSULE | Freq: Three times a day (TID) | ORAL | Status: DC
  Administered 2019-10-22 – 2019-10-23 (×3): 100 mg via ORAL
  Filled 2019-10-22 (×3): qty 1

## 2019-10-22 MED ORDER — VANCOMYCIN HCL 1000 MG IV SOLR
INTRAVENOUS | Status: AC
  Filled 2019-10-22: qty 1000

## 2019-10-22 MED ORDER — MIDAZOLAM HCL 2 MG/2ML IJ SOLN
INTRAMUSCULAR | Status: AC
  Filled 2019-10-22: qty 2

## 2019-10-22 MED ORDER — AMISULPRIDE (ANTIEMETIC) 5 MG/2ML IV SOLN: 10.0000 mg | Freq: Once | INTRAVENOUS | Status: DC | PRN

## 2019-10-22 MED ORDER — OXYCODONE HCL 5 MG PO TABS
10.0000 mg | ORAL_TABLET | ORAL | Status: DC | PRN
  Administered 2019-10-22: 10 mg via ORAL
  Filled 2019-10-22: qty 2

## 2019-10-22 MED ORDER — SODIUM CHLORIDE 0.9 % IR SOLN
Status: DC | PRN
  Administered 2019-10-22: 1000 mL

## 2019-10-22 MED ORDER — PROPOFOL 10 MG/ML IV BOLUS
INTRAVENOUS | Status: DC | PRN
  Administered 2019-10-22: 200 mg via INTRAVENOUS
  Administered 2019-10-22: 50 mg via INTRAVENOUS

## 2019-10-22 MED ORDER — OXYCODONE HCL 5 MG PO TABS: 5.0000 mg | ORAL_TABLET | ORAL | Status: DC | PRN

## 2019-10-22 MED ORDER — BISACODYL 5 MG PO TBEC: 5.0000 mg | DELAYED_RELEASE_TABLET | Freq: Every day | ORAL | Status: DC | PRN

## 2019-10-22 MED ORDER — MAGNESIUM CITRATE PO SOLN: 1.0000 | Freq: Once | ORAL | Status: DC | PRN

## 2019-10-22 SURGICAL SUPPLY — 57 items
BANDAGE ESMARK 6X9 LF (GAUZE/BANDAGES/DRESSINGS) IMPLANT
BLADE SURG 10 STRL SS (BLADE) ×3 IMPLANT
BNDG COHESIVE 4X5 TAN STRL (GAUZE/BANDAGES/DRESSINGS) ×1 IMPLANT
BNDG COHESIVE 6X5 TAN STRL LF (GAUZE/BANDAGES/DRESSINGS) ×2 IMPLANT
BNDG ELASTIC 4X5.8 VLCR STR LF (GAUZE/BANDAGES/DRESSINGS) ×3 IMPLANT
BNDG ELASTIC 6X5.8 VLCR STR LF (GAUZE/BANDAGES/DRESSINGS) ×1 IMPLANT
BNDG ESMARK 4X9 LF (GAUZE/BANDAGES/DRESSINGS) ×2 IMPLANT
BNDG ESMARK 6X9 LF (GAUZE/BANDAGES/DRESSINGS) ×3
BNDG GAUZE ELAST 4 BULKY (GAUZE/BANDAGES/DRESSINGS) ×1 IMPLANT
CANISTER WOUND CARE 500ML ATS (WOUND CARE) ×2 IMPLANT
CASSETTE VERAFLO VERALINK (MISCELLANEOUS) ×2 IMPLANT
CNTNR URN SCR LID CUP LEK RST (MISCELLANEOUS) IMPLANT
CONT SPEC 4OZ STRL OR WHT (MISCELLANEOUS)
COVER SURGICAL LIGHT HANDLE (MISCELLANEOUS) ×3 IMPLANT
DRAPE SURG 17X23 STRL (DRAPES) IMPLANT
DRAPE U-SHAPE 47X51 STRL (DRAPES) IMPLANT
DRESSING VERAFLO CLEANSE CC (GAUZE/BANDAGES/DRESSINGS) IMPLANT
DRSG PAD ABDOMINAL 8X10 ST (GAUZE/BANDAGES/DRESSINGS) ×1 IMPLANT
DRSG VERAFLO CLEANSE CC (GAUZE/BANDAGES/DRESSINGS) ×3
DRSG VERAFLO VAC MED (GAUZE/BANDAGES/DRESSINGS) IMPLANT
ELECT REM PT RETURN 9FT ADLT (ELECTROSURGICAL)
ELECTRODE REM PT RTRN 9FT ADLT (ELECTROSURGICAL) IMPLANT
FACESHIELD WRAPAROUND (MASK) IMPLANT
FACESHIELD WRAPAROUND OR TEAM (MASK) ×1 IMPLANT
GAUZE SPONGE 4X4 12PLY STRL (GAUZE/BANDAGES/DRESSINGS) ×1 IMPLANT
GAUZE XEROFORM 1X8 LF (GAUZE/BANDAGES/DRESSINGS) ×1 IMPLANT
GLOVE BIO SURGEON STRL SZ7.5 (GLOVE) ×5 IMPLANT
GLOVE BIOGEL PI IND STRL 7.5 (GLOVE) ×1 IMPLANT
GLOVE BIOGEL PI IND STRL 8 (GLOVE) ×1 IMPLANT
GLOVE BIOGEL PI INDICATOR 7.5 (GLOVE) ×4
GLOVE BIOGEL PI INDICATOR 8 (GLOVE) ×2
GLOVE SURG SYN 7.5  E (GLOVE) ×3
GLOVE SURG SYN 7.5 E (GLOVE) ×1 IMPLANT
GLOVE SURG SYN 7.5 PF PI (GLOVE) ×1 IMPLANT
GOWN STRL REUS W/ TWL LRG LVL3 (GOWN DISPOSABLE) ×2 IMPLANT
GOWN STRL REUS W/ TWL XL LVL3 (GOWN DISPOSABLE) ×2 IMPLANT
GOWN STRL REUS W/TWL LRG LVL3 (GOWN DISPOSABLE) ×6
GOWN STRL REUS W/TWL XL LVL3 (GOWN DISPOSABLE) ×6
KIT BASIN OR (CUSTOM PROCEDURE TRAY) ×3 IMPLANT
KIT TURNOVER KIT B (KITS) ×3 IMPLANT
MANIFOLD NEPTUNE II (INSTRUMENTS) ×3 IMPLANT
NDL HYPO 25GX1X1/2 BEV (NEEDLE) IMPLANT
NEEDLE HYPO 25GX1X1/2 BEV (NEEDLE) IMPLANT
NS IRRIG 1000ML POUR BTL (IV SOLUTION) ×3 IMPLANT
PACK ORTHO EXTREMITY (CUSTOM PROCEDURE TRAY) ×3 IMPLANT
PAD ARMBOARD 7.5X6 YLW CONV (MISCELLANEOUS) ×6 IMPLANT
SET CYSTO W/LG BORE CLAMP LF (SET/KITS/TRAYS/PACK) ×2 IMPLANT
SOL PREP PROV IODINE SCRUB 4OZ (MISCELLANEOUS) ×2 IMPLANT
SOLUTION BETADINE 4OZ (MISCELLANEOUS) ×2 IMPLANT
SPONGE LAP 18X18 RF (DISPOSABLE) ×4 IMPLANT
STOCKINETTE IMPERVIOUS 9X36 MD (GAUZE/BANDAGES/DRESSINGS) ×3 IMPLANT
TOWEL GREEN STERILE (TOWEL DISPOSABLE) ×3 IMPLANT
TOWEL GREEN STERILE FF (TOWEL DISPOSABLE) ×3 IMPLANT
TUBE CONNECTING 12'X1/4 (SUCTIONS) ×1
TUBE CONNECTING 12X1/4 (SUCTIONS) ×2 IMPLANT
UNDERPAD 30X36 HEAVY ABSORB (UNDERPADS AND DIAPERS) ×3 IMPLANT
YANKAUER SUCT BULB TIP NO VENT (SUCTIONS) ×3 IMPLANT

## 2019-10-22 NOTE — Anesthesia Preprocedure Evaluation (Signed)
Anesthesia Evaluation  Patient identified by MRN, date of birth, ID band Patient awake    Reviewed: Allergy & Precautions, NPO status , Patient's Chart, lab work & pertinent test results  Airway Mallampati: II  TM Distance: >3 FB Neck ROM: Full    Dental  (+) Partial Upper, Dental Advisory Given, Poor Dentition   Pulmonary Current Smoker,    breath sounds clear to auscultation (-) decreased breath sounds      Cardiovascular negative cardio ROS   Rhythm:Regular Rate:Normal     Neuro/Psych  Headaches, PSYCHIATRIC DISORDERS Anxiety Depression    GI/Hepatic Neg liver ROS, GERD  ,  Endo/Other  negative endocrine ROS  Renal/GU negative Renal ROS     Musculoskeletal  (+) Arthritis ,   Abdominal (+) + obese,   Peds  Hematology negative hematology ROS (+)   Anesthesia Other Findings   Reproductive/Obstetrics                             Anesthesia Physical Anesthesia Plan  ASA: II  Anesthesia Plan: General   Post-op Pain Management:    Induction: Intravenous  PONV Risk Score and Plan: 3 and Ondansetron, Dexamethasone and Midazolam  Airway Management Planned: LMA  Additional Equipment: None  Intra-op Plan:   Post-operative Plan: Extubation in OR  Informed Consent: I have reviewed the patients History and Physical, chart, labs and discussed the procedure including the risks, benefits and alternatives for the proposed anesthesia with the patient or authorized representative who has indicated his/her understanding and acceptance.       Plan Discussed with: CRNA  Anesthesia Plan Comments:         Anesthesia Quick Evaluation

## 2019-10-22 NOTE — Anesthesia Postprocedure Evaluation (Signed)
Anesthesia Post Note  Patient: [name] [name]  Procedure(s) Performed: IRRIGATION AND DEBRIDEMENT EXTREMITY; APPLICATION OF WOUND VAC (Left Leg Lower)     Patient location during evaluation: PACU Anesthesia Type: General Level of consciousness: awake and alert Vital Signs Assessment: post-procedure vital signs reviewed and stable Respiratory status: spontaneous breathing, nonlabored ventilation, respiratory function stable and patient connected to nasal cannula oxygen Cardiovascular status: blood pressure returned to baseline and stable Postop Assessment: no apparent nausea or vomiting Anesthetic complications: no   No complications documented.  Last Vitals:  Vitals:   10/22/19 1036 10/22/19 1040  BP: 124/83   Pulse: 83 97  Resp:  15  Temp:  (!) 36.3 C  SpO2: 100% 100%           Effie Berkshire

## 2019-10-22 NOTE — Transfer of Care (Signed)
Immediate Anesthesia Transfer of Care Note  Patient: [name] [name]  Procedure(s) Performed: IRRIGATION AND DEBRIDEMENT EXTREMITY; APPLICATION OF WOUND VAC (Left Leg Lower)  Patient Location: PACU  Anesthesia Type:General  Level of Consciousness: awake, alert , oriented and pateint uncooperative  Airway & Oxygen Therapy: Patient Spontanous Breathing  Post-op Assessment: Report given to RN and Post -op Vital signs reviewed and stable  Post vital signs: Reviewed and stable  Last Vitals:  Vitals Value Taken Time  BP 139/109 10/22/19 0905  Temp 36.3 C 10/22/19 0903  Pulse 102 10/22/19 0914  Resp 30 10/22/19 0904  SpO2 99 % 10/22/19 0914  Vitals shown include unvalidated device data.  Last Pain:  Vitals:   10/22/19 0458  TempSrc: Oral  PainSc:          Complications: No complications documented.

## 2019-10-22 NOTE — Anesthesia Procedure Notes (Signed)
Procedure Name: LMA Insertion Date/Time: 10/22/2019 8:04 AM Performed by: Griffin Dakin, CRNA Pre-anesthesia Checklist: Patient identified, Emergency Drugs available, Suction available and Patient being monitored Patient Re-evaluated:Patient Re-evaluated prior to induction Oxygen Delivery Method: Circle system utilized Preoxygenation: Pre-oxygenation with 100% oxygen Induction Type: IV induction Ventilation: Mask ventilation without difficulty LMA: LMA inserted LMA Size: 4.0 Number of attempts: 1 Placement Confirmation: positive ETCO2 and breath sounds checked- equal and bilateral Tube secured with: Tape Dental Injury: Teeth and Oropharynx as per pre-operative assessment

## 2019-10-22 NOTE — Progress Notes (Signed)
MD on call notified that patient states she is not getting relief from pain medication. She was very emotional stated she lost her daughter a month ago. She requested Dilaudid and anxiety and break through pain medication. Arthor Captain LPN
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
## 2019-10-22 NOTE — Plan of Care (Signed)
°  Problem: Clinical Measurements: °Goal: Ability to maintain clinical measurements within normal limits will improve °Outcome: Progressing °Goal: Will remain free from infection °Outcome: Progressing °Goal: Diagnostic test results will improve °Outcome: Progressing °Goal: Respiratory complications will improve °Outcome: Progressing °Goal: Cardiovascular complication will be avoided °Outcome: Progressing °  °Problem: Activity: °Goal: Risk for activity intolerance will decrease °Outcome: Progressing °  °Problem: Nutrition: °Goal: Adequate nutrition will be maintained °Outcome: Progressing °  °Problem: Coping: °Goal: Level of anxiety will decrease °Outcome: Progressing °  °Problem: Elimination: °Goal: Will not experience complications related to bowel motility °Outcome: Progressing °Goal: Will not experience complications related to urinary retention °Outcome: Progressing °  °Problem: Pain Managment: °Goal: General experience of comfort will improve °Outcome: Progressing °  °Problem: Safety: °Goal: Ability to remain free from injury will improve °Outcome: Progressing °  °

## 2019-10-22 NOTE — Interval H&P Note (Signed)
History and Physical Interval Note:  10/22/2019 7:28 AM  [name] [name]  has presented today for surgery, with the diagnosis of Left leg infection.  The various methods of treatment have been discussed with the patient and family. After consideration of risks, benefits and other options for treatment, the patient has consented to  Procedure(s): IRRIGATION AND DEBRIDEMENT EXTREMITY (Left) as a surgical intervention.  The patient's history has been reviewed, patient examined, no change in status, stable for surgery.  I have reviewed the patient's chart and labs.  Questions were answered to the patient's satisfaction.     Renette Butters

## 2019-10-22 NOTE — Progress Notes (Signed)
VAST consulted to obtain IV access. Pt taken to OR and IV access obtained before IV team could respond.

## 2019-10-23 ENCOUNTER — Encounter (HOSPITAL_COMMUNITY): Payer: Self-pay | Admitting: Orthopedic Surgery

## 2019-10-23 DIAGNOSIS — T148XXA Other injury of unspecified body region, initial encounter: Secondary | ICD-10-CM | POA: Diagnosis not present

## 2019-10-23 DIAGNOSIS — L089 Local infection of the skin and subcutaneous tissue, unspecified: Secondary | ICD-10-CM | POA: Diagnosis not present

## 2019-10-23 LAB — AEROBIC CULTURE W GRAM STAIN (SUPERFICIAL SPECIMEN)

## 2019-10-23 LAB — BASIC METABOLIC PANEL
Anion gap: 10 (ref 5–15)
BUN: 6 mg/dL (ref 6–20)
CO2: 23 mmol/L (ref 22–32)
Calcium: 8.3 mg/dL — ABNORMAL LOW (ref 8.9–10.3)
Chloride: 101 mmol/L (ref 98–111)
Creatinine, Ser: 0.61 mg/dL (ref 0.44–1.00)
GFR calc Af Amer: >60 mL/min (ref >60)
GFR calc non Af Amer: >60 mL/min (ref >60)
Glucose, Bld: 82 mg/dL (ref 70–99)
Potassium: 3.2 mmol/L — ABNORMAL LOW (ref 3.5–5.1)
Sodium: 134 mmol/L — ABNORMAL LOW (ref 135–145)

## 2019-10-23 LAB — CBC
HCT: 29.5 % — ABNORMAL LOW (ref 36.0–46.0)
Hemoglobin: 9.8 g/dL — ABNORMAL LOW (ref 12.0–15.0)
MCH: 28.8 pg (ref 26.0–34.0)
MCHC: 33.2 g/dL (ref 30.0–36.0)
MCV: 86.8 fL (ref 80.0–100.0)
Platelets: 304 10*3/uL (ref 150–400)
RBC: 3.4 MIL/uL — ABNORMAL LOW (ref 3.87–5.11)
RDW: 14.5 % (ref 11.5–15.5)
WBC: 30.1 10*3/uL — ABNORMAL HIGH (ref 4.0–10.5)
nRBC: 0 % (ref 0.0–0.2)

## 2019-10-23 MED ORDER — HYDROCODONE-ACETAMINOPHEN 5-325 MG PO TABS
1.0000 | ORAL_TABLET | Freq: Four times a day (QID) | ORAL | Status: DC | PRN
  Administered 2019-10-24 – 2019-11-01 (×18): 1 via ORAL
  Filled 2019-10-23 (×18): qty 1

## 2019-10-23 MED ORDER — INFLUENZA VAC SPLIT QUAD 0.5 ML IM SUSY: 0.5000 mL | PREFILLED_SYRINGE | INTRAMUSCULAR | Status: DC

## 2019-10-23 MED ORDER — METHOCARBAMOL 1000 MG/10ML IJ SOLN
1000.0000 mg | Freq: Three times a day (TID) | INTRAVENOUS | Status: DC | PRN
  Filled 2019-10-23: qty 10

## 2019-10-23 MED ORDER — POTASSIUM CHLORIDE CRYS ER 20 MEQ PO TBCR
40.0000 meq | EXTENDED_RELEASE_TABLET | ORAL | Status: DC
  Filled 2019-10-23: qty 2

## 2019-10-23 MED ORDER — ACETAMINOPHEN 500 MG PO TABS
500.0000 mg | ORAL_TABLET | Freq: Three times a day (TID) | ORAL | Status: AC
  Administered 2019-10-23 – 2019-10-26 (×9): 500 mg via ORAL
  Filled 2019-10-23 (×9): qty 1

## 2019-10-23 MED ORDER — GABAPENTIN 300 MG PO CAPS
300.0000 mg | ORAL_CAPSULE | Freq: Three times a day (TID) | ORAL | Status: DC
  Administered 2019-10-23 – 2019-11-01 (×27): 300 mg via ORAL
  Filled 2019-10-23 (×27): qty 1

## 2019-10-23 MED ORDER — ADULT MULTIVITAMIN W/MINERALS CH
1.0000 | ORAL_TABLET | Freq: Every day | ORAL | Status: DC
  Administered 2019-10-23 – 2019-11-01 (×9): 1 via ORAL
  Filled 2019-10-23 (×9): qty 1

## 2019-10-23 MED ORDER — POTASSIUM CHLORIDE 10 MEQ/100ML IV SOLN
10.0000 meq | INTRAVENOUS | Status: AC
  Administered 2019-10-23 (×3): 10 meq via INTRAVENOUS
  Filled 2019-10-23 (×3): qty 100

## 2019-10-23 MED ORDER — ENSURE ENLIVE PO LIQD
237.0000 mL | Freq: Three times a day (TID) | ORAL | Status: DC
  Administered 2019-10-23 – 2019-10-31 (×18): 237 mL via ORAL

## 2019-10-23 MED ORDER — METHOCARBAMOL 500 MG PO TABS
1000.0000 mg | ORAL_TABLET | Freq: Three times a day (TID) | ORAL | Status: DC | PRN
  Administered 2019-10-23 – 2019-11-01 (×14): 1000 mg via ORAL
  Filled 2019-10-23 (×14): qty 2

## 2019-10-23 MED ORDER — MORPHINE SULFATE (PF) 2 MG/ML IV SOLN
2.0000 mg | INTRAVENOUS | Status: DC | PRN
  Administered 2019-10-23 – 2019-10-24 (×4): 2 mg via INTRAVENOUS
  Filled 2019-10-23 (×4): qty 1

## 2019-10-23 MED ORDER — HYDROMORPHONE HCL 1 MG/ML IJ SOLN: 0.5000 mg | INTRAMUSCULAR | Status: DC | PRN

## 2019-10-23 NOTE — Progress Notes (Addendum)
Patient refused to take PO Potassium. Educated patient prior, she stated" those pills are awful, I might need to talk MD to do IV option". Informed MD Ogbata about refusal and patient statement. MD aware.

## 2019-10-23 NOTE — Progress Notes (Signed)
Dear Doctor: This patient has been identified as a candidate for PICC line for the following reason (s): on vancomycin multiple other medications, has already had 2 PIV and 2 midlines. Poor vasculature.  If you agree, please write an order for the indicated device.   Thank you for supporting the early vascular access assessment program.
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
## 2019-10-23 NOTE — Progress Notes (Signed)
Paged MD again about patient refusal.

## 2019-10-23 NOTE — Progress Notes (Signed)
Initial Nutrition Assessment  DOCUMENTATION CODES:   Not applicable  INTERVENTION:    Ensure Enlive po TID, each supplement provides 350 kcal and 20 grams of protein   MVI with minerals daily.  NUTRITION DIAGNOSIS:   Increased nutrient needs related to wound healing as evidenced by estimated needs.  GOAL:   Patient will meet greater than or equal to 90% of their needs  MONITOR:   PO intake, Supplement acceptance, Skin  REASON FOR ASSESSMENT:   Consult Wound healing  ASSESSMENT:   39 yo female admitted with large infected left lower leg wound. S/P I&D 10/2. PMH includes IVDU, epidural abscess, tobacco use, depression, anxiety, GERD.   Patient is now requiring IV antibiotics. May need PICC placement.  Currently on a regular diet. She ate 0% of dinner yesterday. No other meal intakes documented.  Patient did not answer when room number called this morning. Unable to obtain nutrition history.   Patient now has increased nutrient needs related to LLE wound. Wound VAC in place. Will add PO supplements and MVI.  Labs reviewed. Sodium 134, potassium 3.2  Medications reviewed and include colace.   NUTRITION - FOCUSED PHYSICAL EXAM:  unable to complete, RD working remotely  Diet Order:   Diet Order            Diet regular Room service appropriate? Yes; Fluid consistency: Thin  Diet effective now                 EDUCATION NEEDS:   Not appropriate for education at this time  Skin:  Skin Assessment: Skin Integrity Issues: Skin Integrity Issues:: Wound VAC Wound Vac: L leg wound s/p I&D  Last BM:  10/1  Height:   Ht Readings from Last 1 Encounters:  10/21/19 5\' 2"  (1.575 m)    Weight:   Wt Readings from Last 1 Encounters:  10/22/19 89.7 kg    Ideal Body Weight:  50 kg  BMI:  Body mass index is 36.17 kg/m.  Estimated Nutritional Needs:   Kcal:  2000-2200  Protein:  100-125 gm  Fluid:  >/= 2 L    Lucas Mallow, RD, LDN, CNSC Please  refer to Amion for contact information.

## 2019-10-23 NOTE — Op Note (Signed)
10/22/2019  11:59 AM  PATIENT:  [name] [name]    PRE-OPERATIVE DIAGNOSIS:  Left leg infection  POST-OPERATIVE DIAGNOSIS:  Same  PROCEDURE:  IRRIGATION AND DEBRIDEMENT EXTREMITY; APPLICATION OF WOUND VAC  SURGEON:  Renette Butters, MD  ASSISTANT: Margy Clarks, PA-C, he was present and scrubbed throughout the case, critical for completion in a timely fashion, and for retraction, instrumentation, and closure.   ANESTHESIA:   gen  PREOPERATIVE INDICATIONS:  [name] [name] is a  39 y.o. female with a diagnosis of Left leg infection who failed conservative measures and elected for surgical management.    The risks benefits and alternatives were discussed with the patient preoperatively including but not limited to the risks of infection, bleeding, nerve injury, cardiopulmonary complications, the need for revision surgery, among others, and the patient was willing to proceed.  OPERATIVE IMPLANTS: none  OPERATIVE FINDINGS: purulent fluid  BLOOD LOSS: min  COMPLICATIONS: non  TOURNIQUET TIME: none  OPERATIVE PROCEDURE:  Patient was identified in the preoperative holding area and site was marked by me She was transported to the operating theater and placed on the table in supine position taking care to pad all bony prominences. After a preincinduction time out anesthesia was induced. The left lower extremity was prepped and draped in normal sterile fashion and a pre-incision timeout was performed. She received ancef for preoperative antibiotics.   I initially debrided her wound of soft tissue with an excisional debridement down to the level of bone and fascia wound was roughly 5 x 11 cm.  Performed a tenolysis of her anterior compartment.  There was some penetration here so I did I decompress the anterior compartment and confirm no purulent fluid and here.  I debrided her skin edges.  Next I thoroughly irrigated and placed a irrigation wound VAC.  POST OPERATIVE PLAN: DVT  prophylaxis and antibiotics per primary team Dr. [name] Given to assume care on Monday

## 2019-10-23 NOTE — Social Work (Signed)
MSW Intern met with patient to discuss substance abuse resources. MSW Intern notes patient appeared to be in discomfort and stated she is waiting on her pain meds. Patient advised she does not feel like talking about this today.  MSW Intern provided patient with the list and advised her to reach out to social work department for questions or concerns.

## 2019-10-23 NOTE — Progress Notes (Signed)
OT Cancellation Note  Patient Details Name: [name] [name] MRN: [id number] DOB: 04/30/1980   Cancelled Treatment:    Reason Eval/Treat Not Completed: Patient declined, no reason specified Pt declined therapy at this time, requesting to sleep and OT return later. OT will follow-up as time allows and pt is appropriate.   [facility] OTR/L Acute Rehabilitation Services Office: [place] 10/23/2019, 12:29 PM

## 2019-10-23 NOTE — Progress Notes (Signed)
PT Cancellation Note  Patient Details Name: [name] [name] MRN: [id number] DOB: [birth year]   Cancelled Treatment:    Reason Eval/Treat Not Completed: Patient declined, no reason specified.  Pt refused stating she can't get her pain under control. 10/23/2019  [name] Carne., PT Acute Rehabilitation Services [phone]  (pager) [phone]  (office)   [name] [name] 10/23/2019, 2:28 PM
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
## 2019-10-24 ENCOUNTER — Other Ambulatory Visit: Payer: Self-pay | Admitting: Physician Assistant

## 2019-10-24 DIAGNOSIS — L089 Local infection of the skin and subcutaneous tissue, unspecified: Secondary | ICD-10-CM

## 2019-10-24 DIAGNOSIS — M79605 Pain in left leg: Secondary | ICD-10-CM

## 2019-10-24 DIAGNOSIS — T148XXA Other injury of unspecified body region, initial encounter: Secondary | ICD-10-CM

## 2019-10-24 DIAGNOSIS — F1910 Other psychoactive substance abuse, uncomplicated: Secondary | ICD-10-CM

## 2019-10-24 DIAGNOSIS — M726 Necrotizing fasciitis: Secondary | ICD-10-CM

## 2019-10-24 DIAGNOSIS — B950 Streptococcus, group A, as the cause of diseases classified elsewhere: Secondary | ICD-10-CM

## 2019-10-24 LAB — RENAL FUNCTION PANEL
Albumin: 2.2 g/dL — ABNORMAL LOW (ref 3.5–5.0)
Anion gap: 9 (ref 5–15)
BUN: 7 mg/dL (ref 6–20)
CO2: 23 mmol/L (ref 22–32)
Calcium: 8.7 mg/dL — ABNORMAL LOW (ref 8.9–10.3)
Chloride: 103 mmol/L (ref 98–111)
Creatinine, Ser: 0.6 mg/dL (ref 0.44–1.00)
GFR calc Af Amer: >60 mL/min (ref >60)
GFR calc non Af Amer: >60 mL/min (ref >60)
Glucose, Bld: 79 mg/dL (ref 70–99)
Phosphorus: 2.4 mg/dL — ABNORMAL LOW (ref 2.5–4.6)
Potassium: 3.8 mmol/L (ref 3.5–5.1)
Sodium: 135 mmol/L (ref 135–145)

## 2019-10-24 LAB — CBC
HCT: 32.7 % — ABNORMAL LOW (ref 36.0–46.0)
Hemoglobin: 10.5 g/dL — ABNORMAL LOW (ref 12.0–15.0)
MCH: 28.4 pg (ref 26.0–34.0)
MCHC: 32.1 g/dL (ref 30.0–36.0)
MCV: 88.4 fL (ref 80.0–100.0)
Platelets: 335 10*3/uL (ref 150–400)
RBC: 3.7 MIL/uL — ABNORMAL LOW (ref 3.87–5.11)
RDW: 14.5 % (ref 11.5–15.5)
WBC: 23 10*3/uL — ABNORMAL HIGH (ref 4.0–10.5)
nRBC: 0 % (ref 0.0–0.2)

## 2019-10-24 LAB — VANCOMYCIN, TROUGH: Vancomycin Tr: 19 ug/mL (ref 15–20)

## 2019-10-24 LAB — MAGNESIUM: Magnesium: 2 mg/dL (ref 1.7–2.4)

## 2019-10-24 LAB — HEMOGLOBIN A1C
Hgb A1c MFr Bld: 5.6 % (ref 4.8–5.6)
Mean Plasma Glucose: 114.02 mg/dL

## 2019-10-24 MED ORDER — ENSURE MAX PROTEIN PO LIQD
11.0000 [oz_av] | Freq: Two times a day (BID) | ORAL | Status: DC
  Administered 2019-10-24: 11 [oz_av] via ORAL
  Filled 2019-10-24 (×2): qty 330

## 2019-10-24 MED ORDER — HYDROMORPHONE HCL 1 MG/ML IJ SOLN
0.5000 mg | INTRAMUSCULAR | Status: DC | PRN
  Administered 2019-10-24 – 2019-10-26 (×16): 0.5 mg via INTRAVENOUS
  Filled 2019-10-24 (×16): qty 0.5

## 2019-10-24 MED ORDER — VANCOMYCIN HCL IN DEXTROSE 1-5 GM/200ML-% IV SOLN
1000.0000 mg | Freq: Two times a day (BID) | INTRAVENOUS | Status: DC
  Administered 2019-10-24 – 2019-10-25 (×2): 1000 mg via INTRAVENOUS
  Filled 2019-10-24 (×2): qty 200

## 2019-10-24 NOTE — Evaluation (Signed)
Occupational Therapy Evaluation Patient Details Name: [name] [name] MRN: [id number] DOB: 12-04-1980 Today's Date: 10/24/2019    History of Present Illness Patient is a 39 y/o female who presents with LLE wound now s/p I&D with wound vac application 78/2. + heroin and xanax on admission. PMh includes cocaine abuse, migraine, anxiety.   Clinical Impression   Pt was independent prior to admission. Presents with L LE pain limiting mobility and ADL. Pt requiring min assist for transfers and set up to moderate assistance for ADL. Pt reports her home had mold and she and her boyfriend are looking for a new place to stay. Pt likely to progress well, but anticipates further surgery. Will follow acutely.    Follow Up Recommendations  No OT follow up    Equipment Recommendations  None recommended by OT    Recommendations for Other Services       Precautions / Restrictions Precautions Precautions: Fall;Other (comment) Precaution Comments: wound vac Restrictions Weight Bearing Restrictions: Yes LLE Weight Bearing: Weight bearing as tolerated      Mobility Bed Mobility Overal bed mobility: Needs Assistance Bed Mobility: Supine to Sit;Sit to Supine     Supine to sit: Min guard;HOB elevated Sit to supine: Min guard;HOB elevated   General bed mobility comments: increased time, use of rail, HOB up 30 degrees, pain limits  Transfers Overall transfer level: Needs assistance Equipment used: None Transfers: Sit to/from [facility] Sit to Stand: Min assist Stand pivot transfers: Min assist       General transfer comment: steadying assist due to pt being unable to place much weight through L LE    Balance Overall balance assessment: Needs assistance Sitting-balance support: Feet unsupported;Single extremity supported Sitting balance-Leahy Scale: Fair     Standing balance support: During functional activity Standing balance-Leahy Scale: Poor Standing balance  comment: requries UE support for standing/transfers due to unwillingness to place weight through LLE                           ADL either performed or assessed with clinical judgement   ADL Overall ADL's : Needs assistance/impaired Eating/Feeding: Independent   Grooming: Sitting;Set up   Upper Body Bathing: Set up;Sitting   Lower Body Bathing: Moderate assistance;Sit to/from stand   Upper Body Dressing : Set up;Sitting   Lower Body Dressing: Moderate assistance;Sit to/from stand   Toilet Transfer: Minimal assistance;Stand-pivot;BSC   Toileting- Clothing Manipulation and Hygiene: Minimal assistance;Sit to/from stand               Vision Baseline Vision/History: No visual deficits Patient Visual Report: No change from baseline       Perception     Praxis      Pertinent Vitals/Pain Pain Assessment: Faces Pain Score: 10-Worst pain ever Faces Pain Scale: Hurts whole lot Pain Location: LLE Pain Descriptors / Indicators: Discomfort;Grimacing;Crying;Guarding;Throbbing Pain Intervention(s): Monitored during session;Premedicated before session     Hand Dominance Right   Extremity/Trunk Assessment Upper Extremity Assessment Upper Extremity Assessment: Overall WFL for tasks assessed   Lower Extremity Assessment Lower Extremity Assessment: Defer to PT evaluation    Cervical / Trunk Assessment Cervical / Trunk Assessment: Normal;Other exceptions (hx of back sx)   Communication Communication Communication: No difficulties   Cognition Arousal/Alertness: Awake/alert Behavior During Therapy: Anxious Overall Cognitive Status: Within Functional Limits for tasks assessed  General Comments      Exercises     Shoulder Instructions      Home Living Family/patient expects to be discharged to:: Private residence Living Arrangements: Spouse/significant other Available Help at Discharge: Family;Available 24  hours/day Type of Home: Homeless                    Home Equipment: None          Prior Functioning/Environment Level of Independence: Independent        Comments: Lives with b/f. Reports all their things are in storage and they are in between homes. Might be staying with his parents at d/c.         OT Problem List: Impaired balance (sitting and/or standing);Decreased knowledge of use of DME or AE;Pain      OT Treatment/Interventions: Self-care/ADL training;DME and/or AE instruction;Patient/family education;Balance training;Therapeutic activities    OT Goals(Current goals can be found in the care plan section) Acute Rehab OT Goals Patient Stated Goal: to get pain under control OT Goal Formulation: With patient Time For Goal Achievement: 11/07/19 Potential to Achieve Goals: Good ADL Goals Pt Will Perform Grooming: with modified independence;standing Pt Will Perform Lower Body Bathing: with modified independence Pt Will Perform Lower Body Dressing: with modified independence;sit to/from stand Pt Will Transfer to Toilet: with modified independence;ambulating;regular height toilet Pt Will Perform Toileting - Clothing Manipulation and hygiene: with modified independence;sit to/from stand Additional ADL Goal #1: Pt will gather items necessary for ADL around room with RW modified independently.  OT Frequency: Min 2X/week   Barriers to D/C:            Co-evaluation              AM-PAC OT "6 Clicks" Daily Activity     Outcome Measure Help from another person eating meals?: None Help from another person taking care of personal grooming?: A Little Help from another person toileting, which includes using toliet, bedpan, or urinal?: A Little Help from another person bathing (including washing, rinsing, drying)?: A Lot Help from another person to put on and taking off regular upper body clothing?: A Little Help from another person to put on and taking off regular  lower body clothing?: A Lot 6 Click Score: 17   End of Session Equipment Utilized During Treatment: Gait belt  Activity Tolerance: Patient limited by pain Patient left: in bed;with call bell/phone within reach  OT Visit Diagnosis: Unsteadiness on feet (R26.81);Pain;Muscle weakness (generalized) (M62.81)                Time: 4917-9150 OT Time Calculation (min): 16 min Charges:  OT General Charges $OT Visit: 1 Visit OT Evaluation $OT Eval Moderate Complexity: 1 Mod  Nestor Lewandowsky, OTR/L Acute Rehabilitation Services Pager: [phone] Office: [phone]  [name] 10/24/2019, 3:16 PM
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
## 2019-10-24 NOTE — Progress Notes (Signed)
Pharmacy Antibiotic Note  Chanley Mcenery is a 39 y.o. female admitted on 10/20/2019 with left leg ulcer.  Pharmacy has been consulted for Cefepime and Vancomycin dosing.  Pt is on D4 of vanc/cefepime for necrotizing fasciitis. Cultures have grown out group A strep. She is s/p I&D on 10/2. Wound vac is in place. She has remained AF. WBC has trended up to 23. Likely to return to OR on Friday for skin graft. Vanc trough came back at 19 today (goal 10-15). Scr stable. We will optimize abx once cx finalized. Likely tomorrow.   Plan: Cefepime 2gm IV q8h Decrease vancomycin 1000mg  IV q12h Level as needed  Height: 5\' 2"  (157.5 cm) Weight: 89.7 kg (197 lb 12 oz) IBW/kg (Calculated) : 50.1  Temp (24hrs), Avg:98 F (36.7 C), Min:98 F (36.7 C), Max:98 F (36.7 C)  Recent Labs  Lab 10/21/19 0031 10/22/19 1320 10/23/19 0640 10/24/19 0353 10/24/19 0820  WBC 15.3* 10.9* 30.1* 23.0*  --   CREATININE 0.65 0.57 0.61 0.60  --   LATICACIDVEN 1.6  --   --   --   --   VANCOTROUGH  --   --   --   --  19    Estimated Creatinine Clearance: 96.3 mL/min (by C-G formula based on SCr of 0.6 mg/dL).    Allergies  Allergen Reactions  . Epidural Tray 17gx3-1-2" [Nerve Block Tray] Other (See Comments)    Paralysis and severe pain in head/neck/shoulders.  No anaphylaxis.  . Ibuprofen Hives  . Penicillins Hives     Tolerated Cephalosporin 10/22/2019.   Pt has tolerated cephalosporins on past admissions. Has patient had a PCN reaction causing immediate rash, facial/tongue/throat swelling, SOB or lightheadedness with hypotension: Yes Has patient had a PCN reaction causing severe rash involving mucus membranes or skin necrosis: No Has patient had a PCN reaction that required hospitalization No Has patient had a PCN reaction occurring within the last 10 years: No If all of the above answers are "NO", then may  . Zofran [Ondansetron Hcl] Nausea And Vomiting    Antimicrobials this admission: 10/1 Aztreonam  x 1 10/1 Cefepime >>  10/1 Vancomycin >>  Microbiology results: 10/1 BCx: ngtd 10/1 L leg wound: GAS  UCx: neg 10/2 shin>>GAS  Onnie Boer, PharmD, BCIDP, AAHIVP, CPP Infectious Disease Pharmacist 10/24/2019 11:19 AM

## 2019-10-24 NOTE — Evaluation (Signed)
Physical Therapy Evaluation Patient Details Name: [name] [name] MRN: [id number] DOB: [birth year] Today's Date: 10/24/2019   History of Present Illness  Patient is a 39 y/o female who presents with LLE wound now s/p I&D with wound vac application 86/7. + heroin and xanax on admission. PMh includes cocaine abuse, migraine, anxiety.  Clinical Impression  Patient presents with pain, decreased activity tolerance and impaired mobility s/p above. Pt and her boyfriend are currently trying to find a place to live and might be staying with bf/s parents for awhile. Pt independent PTA. Today, mobility limited by pain despite being premedicated. Requires Min A for standing and transfers; pt reluctant to place weight through LLE for transfers. Will further assess DME needs with increased activity/tolerance. Per pt, plan for more surgery later this week. Pt highly anxious. Will follow acutely to maximize independence and mobility prior to return home.    Follow Up Recommendations Home health PT;Supervision - Intermittent (pending improvement)    Equipment Recommendations  Other (comment) (TBA)    Recommendations for Other Services       Precautions / Restrictions Precautions Precautions: Other (comment);Fall Precaution Comments: wound vac Restrictions Weight Bearing Restrictions: Yes LLE Weight Bearing: Weight bearing as tolerated      Mobility  Bed Mobility Overal bed mobility: Needs Assistance Bed Mobility: Supine to Sit;Sit to Supine     Supine to sit: Min guard;HOB elevated Sit to supine: Min guard;HOB elevated   General bed mobility comments: Increased time and use of rail tog et into bed and return to supine. Limited mainly by pain with movement.  Transfers Overall transfer level: Needs assistance Equipment used: None Transfers: Sit to/from [facility] Sit to Stand: Min assist Stand pivot transfers: Min assist       General transfer comment: ASsist to power to  standing and to steady; SPT bed to/from [facility], reluctant to place weight through LLE.  Ambulation/Gait             General Gait Details: Unable/unwilling  Stairs            Wheelchair Mobility    Modified Rankin (Stroke Patients Only)       Balance Overall balance assessment: Needs assistance Sitting-balance support: Feet unsupported;Single extremity supported Sitting balance-Leahy Scale: Fair     Standing balance support: During functional activity Standing balance-Leahy Scale: Poor Standing balance comment: requries UE support for standing/transfers due to unwillingness to place weight through LLE                             Pertinent Vitals/Pain Pain Assessment: 0-10 Pain Score: 10-Worst pain ever Pain Location: LLE Pain Descriptors / Indicators: Discomfort;Grimacing;Crying;Guarding;Throbbing Pain Intervention(s): Monitored during session;Repositioned;Limited activity within patient's tolerance;Premedicated before session    [place] expects to be discharged to:: Private residence Living Arrangements: Spouse/significant other Available Help at Discharge: Family;Available 24 hours/day Type of Home: House (in between finding a new place to live) Home Access: Level entry     Home Layout: One level Home Equipment: None      Prior Function Level of Independence: Independent         Comments: Lives with b/f. Reports all their things are in storage and they are in between homes. Might be staying with his parents at d/c. Info above is boyfriends parents house info     Hand Dominance   Dominant Hand: Right    Extremity/Trunk Assessment   Upper Extremity Assessment Upper Extremity  Assessment: Defer to OT evaluation    Lower Extremity Assessment Lower Extremity Assessment: RLE deficits/detail;LLE deficits/detail RLE Deficits / Details: Grossly ~4/5 throughout RLE Sensation: decreased light touch LLE Deficits / Details:  Limited movement due to pain, able to wiggle toes and move ankle. LLE Sensation: decreased light touch    Cervical / Trunk Assessment Cervical / Trunk Assessment: Normal  Communication   Communication: No difficulties  Cognition Arousal/Alertness: Awake/alert Behavior During Therapy: Anxious Overall Cognitive Status: Within Functional Limits for tasks assessed                                 General Comments: Distracted by pain.      General Comments General comments (skin integrity, edema, etc.): VSS on RA.    Exercises     Assessment/Plan    PT Assessment Patient needs continued PT services  PT Problem List Decreased mobility;Decreased range of motion;Decreased skin integrity;Pain;Impaired sensation;Decreased balance;Decreased knowledge of use of DME;Decreased activity tolerance       PT Treatment Interventions Therapeutic activities;Gait training;Therapeutic exercise;Patient/family education;Balance training;Functional mobility training;DME instruction    PT Goals (Current goals can be found in the Care Plan section)  Acute Rehab PT Goals Patient Stated Goal: to get pain under control PT Goal Formulation: With patient Time For Goal Achievement: 11/07/19 Potential to Achieve Goals: Good    Frequency Min 3X/week   Barriers to discharge        Co-evaluation               AM-PAC PT "6 Clicks" Mobility  Outcome Measure Help needed turning from your back to your side while in a flat bed without using bedrails?: A Little Help needed moving from lying on your back to sitting on the side of a flat bed without using bedrails?: A Little Help needed moving to and from a bed to a chair (including a wheelchair)?: A Little Help needed standing up from a chair using your arms (e.g., wheelchair or bedside chair)?: A Little Help needed to walk in hospital room?: A Little Help needed climbing 3-5 steps with a railing? : Total 6 Click Score: 16    End of  Session   Activity Tolerance: Patient limited by pain Patient left: in bed;with call bell/phone within reach;with nursing/sitter in room Nurse Communication: Mobility status PT Visit Diagnosis: Pain;Difficulty in walking, not elsewhere classified (R26.2) Pain - Right/Left: Left Pain - part of body: Leg    Time: [phone] PT Time Calculation (min) (ACUTE ONLY): 17 min   Charges:   PT Evaluation $PT Eval Moderate Complexity: 1 Mod PT Treatments $Therapeutic Activity: 8-22 mins        Marisa Severin, PT, DPT Acute Rehabilitation Services Pager [phone] Office [phone]      Marguarite Arbour A [place] 10/24/2019, 11:58 AM

## 2019-10-24 NOTE — Consult Note (Addendum)
ORTHOPAEDIC CONSULTATION  REQUESTING PHYSICIAN: Bonnell Public, MD  Chief Complaint: Left leg pain.  HPI: [name] [name] is a 39 y.o. female who presents with necrotizing fasciitis wound left leg with group A strep.  Patient underwent initial irrigation and debridement with Dr. Di Kindle on Saturday I saw the patient in the operating room at this time and will provide follow-up care and treatment.  Past Medical History:  Diagnosis Date  . Anxiety    klonipin for stress disorder  . Chronic back pain   . Cocaine abuse ([place]) 07/06/2013  . Degenerative disc disease   . Endometriosis    chronic pain  . GERD (gastroesophageal reflux disease)    protonix evenings  . Migraine    Past Surgical History:  Procedure Laterality Date  . ABDOMINAL HYSTERECTOMY    . APPENDECTOMY    . I & D EXTREMITY Left 10/22/2019   Procedure: IRRIGATION AND DEBRIDEMENT EXTREMITY; APPLICATION OF WOUND VAC;  Surgeon: Renette Butters, MD;  Location: [place];  Service: Orthopedics;  Laterality: Left;  . IR THORACENTESIS ASP PLEURAL SPACE W/IMG GUIDE  10/28/2018  . LAMINECTOMY FOR CEREBROSPINAL FLUID LEAK N/A 10/31/2018   Procedure: Thoracic Wound Exploration;  Surgeon: Ashok Pall, MD;  Location: [place];  Service: Neurosurgery;  Laterality: N/A;  . LAPAROSCOPIC ASSISTED VAGINAL HYSTERECTOMY  08/19/2011   Procedure: LAPAROSCOPIC ASSISTED VAGINAL HYSTERECTOMY;  Surgeon: Gus Height, MD;  Location: [facility];  Service: Gynecology;  Laterality: N/A;  . TEE WITHOUT CARDIOVERSION N/A 10/25/2018   Procedure: TRANSESOPHAGEAL ECHOCARDIOGRAM (TEE);  Surgeon: Josue Hector, MD;  Location: [place];  Service: Cardiovascular;  Laterality: N/A;  . THORACIC LAMINECTOMY FOR EPIDURAL ABSCESS N/A 10/20/2018   Procedure: THORACIC Eight, Thoracic nine LAMINECTOMY FOR EPIDURAL ABSCESS;  Surgeon: Eustace Moore, MD;  Location: [place];  Service: Neurosurgery;  Laterality: N/A;  . TONSILLECTOMY    . TUBAL LIGATION     Social  History   Socioeconomic History  . Marital status: Divorced    Spouse name: Not on file  . Number of children: Not on file  . Years of education: Not on file  . Highest education level: Not on file  Occupational History  . Not on file  Tobacco Use  . Smoking status: Current Every Day Smoker    Packs/day: 1.00    Years: 14.00    Pack years: 14.00    Types: Cigarettes  . Smokeless tobacco: Never Used  . Tobacco comment: Smoking 1 PPD  Substance and Sexual Activity  . Alcohol use: No  . Drug use: Yes    Types: Heroin, IV    Comment: heroin  . Sexual activity: Yes  Other Topics Concern  . Not on file  Social History Narrative  . Not on file   Social Determinants of Health   Financial Resource Strain:   . Difficulty of Paying Living Expenses: Not on file  Food Insecurity:   . Worried About Charity fundraiser in the Last Year: Not on file  . Ran Out of Food in the Last Year: Not on file  Transportation Needs:   . Lack of Transportation (Medical): Not on file  . Lack of Transportation (Non-Medical): Not on file  Physical Activity:   . Days of Exercise per Week: Not on file  . Minutes of Exercise per Session: Not on file  Stress:   . Feeling of Stress : Not on file  Social Connections:   . Frequency of Communication with Friends and  Family: Not on file  . Frequency of Social Gatherings with Friends and Family: Not on file  . Attends Religious Services: Not on file  . Active Member of Clubs or Organizations: Not on file  . Attends Archivist Meetings: Not on file  . Marital Status: Not on file   Family History  Problem Relation Age of Onset  . Coronary artery disease Father   . Coronary artery disease Maternal Grandmother   . Breast cancer Mother   . Cancer Mother    - negative except otherwise stated in the family history section Allergies  Allergen Reactions  . Epidural Tray 17gx3-1-2" [Nerve Block Tray] Other (See Comments)    Paralysis and severe  pain in head/neck/shoulders.  No anaphylaxis.  . Ibuprofen Hives  . Penicillins Hives    Pt has tolerated cephalosporins on past admissions. Has patient had a PCN reaction causing immediate rash, facial/tongue/throat swelling, SOB or lightheadedness with hypotension: Yes Has patient had a PCN reaction causing severe rash involving mucus membranes or skin necrosis: No Has patient had a PCN reaction that required hospitalization No Has patient had a PCN reaction occurring within the last 10 years: No If all of the above answers are "NO", then may proceed with Cephalosporin use.   Marland Kitchen Zofran [Ondansetron Hcl] Nausea And Vomiting   Prior to Admission medications   Medication Sig Start Date End Date Taking? Authorizing Provider  buPROPion (WELLBUTRIN SR) 150 MG 12 hr tablet Take 1 tablet (150 mg total) by mouth daily for 3 days, THEN 1 tablet (150 mg total) 2 (two) times daily for 27 days. Patient not taking: Reported on 10/21/2019 03/18/18 10/20/28  Sid Falcon, MD  escitalopram (LEXAPRO) 20 MG tablet Take 1.5 tablets (30 mg total) by mouth daily. Patient not taking: Reported on 10/21/2019 04/06/18 10/20/28  Axel Filler, MD  linezolid (ZYVOX) 600 MG tablet Take 1 tablet (600 mg total) by mouth 2 (two) times daily. Patient not taking: Reported on 10/21/2019 11/08/18   Marianna Payment, MD   No results found. - pertinent xrays, CT, MRI studies were reviewed and independently interpreted  Positive ROS: All other systems have been reviewed and were otherwise negative with the exception of those mentioned in the HPI and as above.  Physical Exam: General: Alert, no acute distress Psychiatric: Patient is competent for consent with normal mood and affect Lymphatic: No axillary or cervical lymphadenopathy Cardiovascular: No pedal edema Respiratory: No cyanosis, no use of accessory musculature GI: No organomegaly, abdomen is soft and non-tender    Images:  @ENCIMAGES @  Labs:  Lab  Results  Component Value Date   HGBA1C 5.9 (H) 10/28/2018   HGBA1C 5.4 12/29/2011   HGBA1C 5.5 06/22/2011   ESRSEDRATE 98 (H) 10/20/2018   CRP 18.8 (H) 10/21/2019   REPTSTATUS PENDING 10/22/2019   GRAMSTAIN  10/22/2019    MODERATE WBC PRESENT,BOTH PMN AND MONONUCLEAR ABUNDANT GRAM POSITIVE COCCI IN PAIRS IN CHAINS IN CLUSTERS    CULT  10/22/2019    CULTURE REINCUBATED FOR BETTER GROWTH Performed at [facility], [place] [address] [place], [place] [address]    LABORGA METHICILLIN RESISTANT STAPHYLOCOCCUS AUREUS 10/20/2018    Lab Results  Component Value Date   ALBUMIN 2.2 (L) 10/24/2019   ALBUMIN 2.6 (L) 10/21/2019   ALBUMIN 2.7 (L) 11/08/2018    Neurologic: Patient does not have protective sensation bilateral lower extremities.   MUSCULOSKELETAL:   Skin: The wound VAC is intact the irrigation was clogged I adjusted  the settings in the installation therapy is working well.  There is clear serosanguineous drainage in the wound VAC.  Patient is tearful with complaints of global pain.  There is no ascending cellulitis no crepitation in the thigh.  Patient's hemoglobin A1c 1 year ago was 5.9.  Her recent albumin is 2.2.  Blood cultures are negative.  Assessment: Assessment: Status post excisional debridement of necrotizing fasciitis left leg on Saturday with Dr. Percell Miller with installation therapy in place currently working.  Prediabetic with severe protein caloric malnutrition.  Plan: Plan: Patient's pain was controlled with Dilaudid I will switch her back from morphine to Dilaudid.  The wound VAC is functioning well plan for return to the operating room on Wednesday for repeat debridement reapplication of installation wound VAC and most likely return to the operating room on Friday for application of split-thickness skin graft.  Discussed with the patient risks of sepsis limb loss persistent pain.  Patient states she understands wished to proceed with the planned  treatment.  I will order a hemoglobin A1c and nutrition supplements.  Thank you for the consult and the opportunity to see Ms. Alena Bills, MD [facility] [phone] 7:36 AM

## 2019-10-24 NOTE — H&P (View-Only) (Signed)
ORTHOPAEDIC CONSULTATION  REQUESTING PHYSICIAN: Bonnell Public, MD  Chief Complaint: Left leg pain.  HPI: [name] [name] is a 39 y.o. female who presents with necrotizing fasciitis wound left leg with group A strep.  Patient underwent initial irrigation and debridement with Dr. Di Kindle on Saturday I saw the patient in the operating room at this time and will provide follow-up care and treatment.  Past Medical History:  Diagnosis Date  . Anxiety    klonipin for stress disorder  . Chronic back pain   . Cocaine abuse ([place]) 07/06/2013  . Degenerative disc disease   . Endometriosis    chronic pain  . GERD (gastroesophageal reflux disease)    protonix evenings  . Migraine    Past Surgical History:  Procedure Laterality Date  . ABDOMINAL HYSTERECTOMY    . APPENDECTOMY    . I & D EXTREMITY Left 10/22/2019   Procedure: IRRIGATION AND DEBRIDEMENT EXTREMITY; APPLICATION OF WOUND VAC;  Surgeon: Renette Butters, MD;  Location: [place];  Service: Orthopedics;  Laterality: Left;  . IR THORACENTESIS ASP PLEURAL SPACE W/IMG GUIDE  10/28/2018  . LAMINECTOMY FOR CEREBROSPINAL FLUID LEAK N/A 10/31/2018   Procedure: Thoracic Wound Exploration;  Surgeon: Ashok Pall, MD;  Location: [place];  Service: Neurosurgery;  Laterality: N/A;  . LAPAROSCOPIC ASSISTED VAGINAL HYSTERECTOMY  08/19/2011   Procedure: LAPAROSCOPIC ASSISTED VAGINAL HYSTERECTOMY;  Surgeon: Gus Height, MD;  Location: [facility];  Service: Gynecology;  Laterality: N/A;  . TEE WITHOUT CARDIOVERSION N/A 10/25/2018   Procedure: TRANSESOPHAGEAL ECHOCARDIOGRAM (TEE);  Surgeon: Josue Hector, MD;  Location: [place];  Service: Cardiovascular;  Laterality: N/A;  . THORACIC LAMINECTOMY FOR EPIDURAL ABSCESS N/A 10/20/2018   Procedure: THORACIC Eight, Thoracic nine LAMINECTOMY FOR EPIDURAL ABSCESS;  Surgeon: Eustace Moore, MD;  Location: [place];  Service: Neurosurgery;  Laterality: N/A;  . TONSILLECTOMY    . TUBAL LIGATION     Social  History   Socioeconomic History  . Marital status: Divorced    Spouse name: Not on file  . Number of children: Not on file  . Years of education: Not on file  . Highest education level: Not on file  Occupational History  . Not on file  Tobacco Use  . Smoking status: Current Every Day Smoker    Packs/day: 1.00    Years: 14.00    Pack years: 14.00    Types: Cigarettes  . Smokeless tobacco: Never Used  . Tobacco comment: Smoking 1 PPD  Substance and Sexual Activity  . Alcohol use: No  . Drug use: Yes    Types: Heroin, IV    Comment: heroin  . Sexual activity: Yes  Other Topics Concern  . Not on file  Social History Narrative  . Not on file   Social Determinants of Health   Financial Resource Strain:   . Difficulty of Paying Living Expenses: Not on file  Food Insecurity:   . Worried About Charity fundraiser in the Last Year: Not on file  . Ran Out of Food in the Last Year: Not on file  Transportation Needs:   . Lack of Transportation (Medical): Not on file  . Lack of Transportation (Non-Medical): Not on file  Physical Activity:   . Days of Exercise per Week: Not on file  . Minutes of Exercise per Session: Not on file  Stress:   . Feeling of Stress : Not on file  Social Connections:   . Frequency of Communication with Friends and  Family: Not on file  . Frequency of Social Gatherings with Friends and Family: Not on file  . Attends Religious Services: Not on file  . Active Member of Clubs or Organizations: Not on file  . Attends Archivist Meetings: Not on file  . Marital Status: Not on file   Family History  Problem Relation Age of Onset  . Coronary artery disease Father   . Coronary artery disease Maternal Grandmother   . Breast cancer Mother   . Cancer Mother    - negative except otherwise stated in the family history section Allergies  Allergen Reactions  . Epidural Tray 17gx3-1-2" [Nerve Block Tray] Other (See Comments)    Paralysis and severe  pain in head/neck/shoulders.  No anaphylaxis.  . Ibuprofen Hives  . Penicillins Hives    Pt has tolerated cephalosporins on past admissions. Has patient had a PCN reaction causing immediate rash, facial/tongue/throat swelling, SOB or lightheadedness with hypotension: Yes Has patient had a PCN reaction causing severe rash involving mucus membranes or skin necrosis: No Has patient had a PCN reaction that required hospitalization No Has patient had a PCN reaction occurring within the last 10 years: No If all of the above answers are "NO", then may proceed with Cephalosporin use.   Marland Kitchen Zofran [Ondansetron Hcl] Nausea And Vomiting   Prior to Admission medications   Medication Sig Start Date End Date Taking? Authorizing Provider  buPROPion (WELLBUTRIN SR) 150 MG 12 hr tablet Take 1 tablet (150 mg total) by mouth daily for 3 days, THEN 1 tablet (150 mg total) 2 (two) times daily for 27 days. Patient not taking: Reported on 10/21/2019 03/18/18 10/20/28  Sid Falcon, MD  escitalopram (LEXAPRO) 20 MG tablet Take 1.5 tablets (30 mg total) by mouth daily. Patient not taking: Reported on 10/21/2019 04/06/18 10/20/28  Axel Filler, MD  linezolid (ZYVOX) 600 MG tablet Take 1 tablet (600 mg total) by mouth 2 (two) times daily. Patient not taking: Reported on 10/21/2019 11/08/18   Marianna Payment, MD   No results found. - pertinent xrays, CT, MRI studies were reviewed and independently interpreted  Positive ROS: All other systems have been reviewed and were otherwise negative with the exception of those mentioned in the HPI and as above.  Physical Exam: General: Alert, no acute distress Psychiatric: Patient is competent for consent with normal mood and affect Lymphatic: No axillary or cervical lymphadenopathy Cardiovascular: No pedal edema Respiratory: No cyanosis, no use of accessory musculature GI: No organomegaly, abdomen is soft and non-tender    Images:  @ENCIMAGES @  Labs:  Lab  Results  Component Value Date   HGBA1C 5.9 (H) 10/28/2018   HGBA1C 5.4 12/29/2011   HGBA1C 5.5 06/22/2011   ESRSEDRATE 98 (H) 10/20/2018   CRP 18.8 (H) 10/21/2019   REPTSTATUS PENDING 10/22/2019   GRAMSTAIN  10/22/2019    MODERATE WBC PRESENT,BOTH PMN AND MONONUCLEAR ABUNDANT GRAM POSITIVE COCCI IN PAIRS IN CHAINS IN CLUSTERS    CULT  10/22/2019    CULTURE REINCUBATED FOR BETTER GROWTH Performed at [facility], [place] [address], [place], [place] [address]    LABORGA METHICILLIN RESISTANT STAPHYLOCOCCUS AUREUS 10/20/2018    Lab Results  Component Value Date   ALBUMIN 2.2 (L) 10/24/2019   ALBUMIN 2.6 (L) 10/21/2019   ALBUMIN 2.7 (L) 11/08/2018    Neurologic: Patient does not have protective sensation bilateral lower extremities.   MUSCULOSKELETAL:   Skin: The wound VAC is intact the irrigation was clogged I adjusted  the settings in the installation therapy is working well.  There is clear serosanguineous drainage in the wound VAC.  Patient is tearful with complaints of global pain.  There is no ascending cellulitis no crepitation in the thigh.  Patient's hemoglobin A1c 1 year ago was 5.9.  Her recent albumin is 2.2.  Blood cultures are negative.  Assessment: Assessment: Status post excisional debridement of necrotizing fasciitis left leg on Saturday with Dr. Percell Miller with installation therapy in place currently working.  Prediabetic with severe protein caloric malnutrition.  Plan: Plan: Patient's pain was controlled with Dilaudid I will switch her back from morphine to Dilaudid.  The wound VAC is functioning well plan for return to the operating room on Wednesday for repeat debridement reapplication of installation wound VAC and most likely return to the operating room on Friday for application of split-thickness skin graft.  Discussed with the patient risks of sepsis limb loss persistent pain.  Patient states she understands wished to proceed with the planned  treatment.  I will order a hemoglobin A1c and nutrition supplements.  Thank you for the consult and the opportunity to see Ms. Alena Bills, MD [facility] [phone] 7:36 AM

## 2019-10-24 NOTE — Progress Notes (Addendum)
Nutrition Follow-up  DOCUMENTATION CODES:   Not applicable  INTERVENTION:   -D/c Ensure Max po BID, each supplement provides 150 kcal and 30 grams of protein.  -Continue Ensure Enlive po TID, each supplement provides 350 kcal and 20 grams of protein -Magic cup TID with meals, each supplement provides 290 kcal and 9 grams of protein -Continue MVI with minerals daily  NUTRITION DIAGNOSIS:   Increased nutrient needs related to wound healing as evidenced by estimated needs.  Ongoing  GOAL:   Patient will meet greater than or equal to 90% of their needs  Progressing   MONITOR:   PO intake, Supplement acceptance, Skin  REASON FOR ASSESSMENT:   Consult Wound healing  ASSESSMENT:   39 yo female admitted with large infected left lower leg wound. S/P I&D 10/2. PMH includes IVDU, epidural abscess, tobacco use, depression, anxiety, GERD.  10/3- s/p PROCEDURE:  IRRIGATION AND DEBRIDEMENT EXTREMITY; APPLICATION OF WOUND VAC  Reviewed I/O's: -2.7 L x 24 hours and -796 ml since admission  UOP: 3 L x 24 hours  Drian output: 300 ml x 24 hours  Case discussed with RN, who reports pt with minimal appetite and has been refusing to take supplements.   Spoke with pt at bedside, who was preparing to eat lunch at time of visit. She shares that she hasn't been feeling like eating much lately as she feels depressed, but is willing to try to eat lunch today (noted meal completion 10-80%).Marland Kitchen Pt reports PTA her appetite was fair and was consuming 2 meals per day.   Pt unsure if she has lost weight. Weight hx reveals wt stability.   RD discussed importance of good meal and supplement intake to promote healing. Reviewed supplements of formulary; pt would like to continue with Ensure Enlive (noted pt with Ensure Max and Ensure Enlive on tray table). Pt reported "I promised my nurse I would drink one this shift, so I will do that later".   Medications reviewed and include colace.  Labs reviewed:  Na: 134, K: 3.2.   NUTRITION - FOCUSED PHYSICAL EXAM:    Most Recent Value  Orbital Region No depletion  Upper Arm Region No depletion  Thoracic and Lumbar Region No depletion  Buccal Region No depletion  Temple Region No depletion  Clavicle Bone Region No depletion  Clavicle and Acromion Bone Region No depletion  Scapular Bone Region No depletion  Dorsal Hand No depletion  Patellar Region No depletion  Anterior Thigh Region No depletion  Posterior Calf Region No depletion  Edema (RD Assessment) Mild  Hair Reviewed  Eyes Reviewed  Mouth Reviewed  Skin Reviewed  Nails Reviewed       Diet Order:   Diet Order            Diet regular Room service appropriate? Yes; Fluid consistency: Thin  Diet effective now                 EDUCATION NEEDS:   Not appropriate for education at this time  Skin:  Skin Assessment: Skin Integrity Issues: Skin Integrity Issues:: Wound VAC Wound Vac: L leg wound s/p I&D  Last BM:  10/23/19  Height:   Ht Readings from Last 1 Encounters:  10/21/19 5\' 2"  (1.575 m)    Weight:   Wt Readings from Last 1 Encounters:  10/22/19 89.7 kg    Ideal Body Weight:  50 kg  BMI:  Body mass index is 36.17 kg/m.  Estimated Nutritional Needs:   Kcal:  2000-2200  Protein:  100-125 gm  Fluid:  >/= 2 L    Loistine Chance, RD, LDN, [place] Registered Dietitian II Certified Diabetes Care and Education Specialist Please refer to [facility] for RD and/or RD on-call/weekend/after hours pager

## 2019-10-25 DIAGNOSIS — B950 Streptococcus, group A, as the cause of diseases classified elsewhere: Secondary | ICD-10-CM

## 2019-10-25 DIAGNOSIS — F1910 Other psychoactive substance abuse, uncomplicated: Secondary | ICD-10-CM | POA: Diagnosis not present

## 2019-10-25 DIAGNOSIS — L089 Local infection of the skin and subcutaneous tissue, unspecified: Secondary | ICD-10-CM | POA: Diagnosis not present

## 2019-10-25 DIAGNOSIS — M726 Necrotizing fasciitis: Secondary | ICD-10-CM

## 2019-10-25 DIAGNOSIS — M79605 Pain in left leg: Secondary | ICD-10-CM | POA: Diagnosis not present

## 2019-10-25 DIAGNOSIS — T148XXA Other injury of unspecified body region, initial encounter: Secondary | ICD-10-CM | POA: Diagnosis not present

## 2019-10-25 MED ORDER — SODIUM CHLORIDE 0.9 % IV SOLN
2.0000 g | INTRAVENOUS | Status: DC
  Administered 2019-10-25 – 2019-11-01 (×8): 2 g via INTRAVENOUS
  Filled 2019-10-25 (×8): qty 20

## 2019-10-25 MED ORDER — CHLORHEXIDINE GLUCONATE 4 % EX LIQD
60.0000 mL | Freq: Once | CUTANEOUS | Status: AC
  Administered 2019-10-26: 4 via TOPICAL
  Filled 2019-10-25: qty 60

## 2019-10-25 MED ORDER — POVIDONE-IODINE 10 % EX SWAB: 2.0000 "application " | Freq: Once | CUTANEOUS | Status: DC

## 2019-10-25 NOTE — Progress Notes (Signed)
Patient ID: [name] [name], female   DOB: 1980/05/08, 39 y.o.   MRN: [id number] Patient is postoperative day 3 irrigation and debridement left calf necrotizing fasciitis with group A strep the installation wound VAC is functioning well.  Plan for return to the operating room tomorrow for repeat irrigation and debridement and anticipate reapplying the installation wound VAC with return to the operating room on Friday for skin graft.

## 2019-10-25 NOTE — Progress Notes (Signed)
Patient ID: Margarete Horace, female   DOB: August 26, 1980, 39 y.o.   MRN: [id number]  PROGRESS NOTE    Chaney Ingram  DJM:[id number] DOB: [birth year] DOA: 10/20/2019 PCP: [name], MD   Brief Narrative:   39 year old female with past medical history significant for IVDU, epidural abscess in October 2020, tobacco use, depression, anxiety and GERD was admitted with large infected left lower leg wound.  She was started on IV antibiotics.  She underwent debridement of the large left lower leg wound and placement of wound VAC.  Assessment & Plan:   Large lower leg wound infection/necrotizing fasciitis -Status post debridement and placement of wound VAC on 10/22/19.  Wound cultures growing strep positive.  Currently on vancomycin and cefepime.  Switch antibiotics to Rocephin. -Dr. [name] Given is planning for repeat surgical intervention tomorrow and on Friday.  Continue pain management.  Leukocytosis -Monitor  Left chest wall/breast cellulitis -Continue wound care and antibiotics plan as above  Substance abuse -Counseled to quit illicit drug use. -Continue to monitor for withdrawals.      DVT prophylaxis: SCDs Code Status:  Full code Family Communication: none at bedside Disposition Plan: Status is: Inpatient  Remains inpatient appropriate because:Inpatient level of care appropriate due to severity of illness   Dispo: The patient is from: Home              Anticipated d/c is to: Home              Anticipated d/c date is: > 3 days              Patient currently is not medically stable to d/c.   Consultants: Orthopedics  Procedures: debridement and placement of wound VAC on 10/22/19  Antimicrobials:  Anti-infectives (From admission, onward)   Start     Dose/Rate Route Frequency Ordered Stop   10/25/19 1400  cefTRIAXone (ROCEPHIN) 2 g in sodium chloride 0.9 % 100 mL IVPB        2 g 200 mL/hr over 30 Minutes Intravenous Every 24 hours 10/25/19 1127     10/24/19 1200  vancomycin  (VANCOCIN) IVPB 1000 mg/200 mL premix  Status:  Discontinued        1,000 mg 200 mL/hr over 60 Minutes Intravenous Every 12 hours 10/24/19 1112 10/25/19 1127   10/22/19 1300  ceFAZolin (ANCEF) IVPB 1 g/50 mL premix  Status:  Discontinued        1 g 100 mL/hr over 30 Minutes Intravenous Every 6 hours 10/22/19 1211 10/22/19 1220   10/22/19 0600  ceFAZolin (ANCEF) IVPB 2g/100 mL premix  Status:  Discontinued        2 g 200 mL/hr over 30 Minutes Intravenous On call to O.R. 10/21/19 1840 10/22/19 0753   10/21/19 0800  vancomycin (VANCOCIN) IVPB 1000 mg/200 mL premix  Status:  Discontinued        1,000 mg 200 mL/hr over 60 Minutes Intravenous Every 8 hours 10/21/19 0432 10/24/19 1112   10/21/19 0600  ceFEPIme (MAXIPIME) 2 g in sodium chloride 0.9 % 100 mL IVPB  Status:  Discontinued        2 g 200 mL/hr over 30 Minutes Intravenous Every 8 hours 10/21/19 0406 10/25/19 1127   10/21/19 0100  aztreonam (AZACTAM) injection 2 g  Status:  Discontinued        2 g Intravenous Every 8 hours 10/21/19 0027 10/21/19 0039   10/21/19 0100  aztreonam (AZACTAM) 2 g in sodium chloride 0.9 % 100 mL IVPB  Status:  Discontinued        2 g 200 mL/hr over 30 Minutes Intravenous Every 8 hours 10/21/19 0039 10/21/19 0356   10/21/19 0030  vancomycin (VANCOCIN) IVPB 1000 mg/200 mL premix        1,000 mg 200 mL/hr over 60 Minutes Intravenous  Once 10/21/19 0027 10/21/19 8938      Subjective: Patient seen and examined at bedside.  Denies any overnight fever or vomiting.  Still complains of lower extremity pain.  Objective: Vitals:   10/24/19 2200 10/24/19 2235 10/25/19 0429 10/25/19 1127  BP:  109/71 139/89 117/78  Pulse:  82 96 80  Resp:  18  17  Temp: 98.6 F (37 C) 98.6 F (37 C) 98.6 F (37 C) 98.3 F (36.8 C)  TempSrc:   Oral   SpO2:  94% 98% 100%  Weight:   96.6 kg   Height:        Intake/Output Summary (Last 24 hours) at 10/25/2019 1330 Last data filed at 10/25/2019 1015 Gross per 24 hour  Intake  520 ml  Output 2200 ml  Net -1680 ml   Filed Weights   10/21/19 0000 10/22/19 0221 10/25/19 0429  Weight: 83 kg 89.7 kg 96.6 kg    Examination:  General exam: Appears calm and comfortable.  Looks chronically ill. Respiratory system: Bilateral decreased breath sounds at bases with scattered crackles Cardiovascular system: S1 & S2 heard, Rate controlled Gastrointestinal system: Abdomen is nondistended, soft and nontender. Normal bowel sounds heard. Extremities: No cyanosis, clubbing; left leg dressing with wound VAC present. Central nervous system: Alert and oriented. No focal neurological deficits. Moving extremities Skin: No other ecchymosis Psychiatry: Looks anxious    Data Reviewed: I have personally reviewed following labs and imaging studies  CBC: Recent Labs  Lab 10/21/19 0031 10/22/19 1320 10/23/19 0640 10/24/19 0353  WBC 15.3* 10.9* 30.1* 23.0*  NEUTROABS 11.4*  --   --   --   HGB 10.7* 9.5* 9.8* 10.5*  HCT 33.0* 29.2* 29.5* 32.7*  MCV 88.9 86.9 86.8 88.4  PLT 441* 340 304 101   Basic Metabolic Panel: Recent Labs  Lab 10/21/19 0031 10/22/19 1320 10/23/19 0640 10/24/19 0353  NA 132*  --  134* 135  K 4.6  --  3.2* 3.8  CL 97*  --  101 103  CO2 27  --  23 23  GLUCOSE 104*  --  82 79  BUN 10  --  6 7  CREATININE 0.65 0.57 0.61 0.60  CALCIUM 9.0  --  8.3* 8.7*  MG  --   --   --  2.0  PHOS  --   --   --  2.4*   GFR: Estimated Creatinine Clearance: 100.4 mL/min (by C-G formula based on SCr of 0.6 mg/dL). Liver Function Tests: Recent Labs  Lab 10/21/19 0031 10/24/19 0353  AST 23  --   ALT 24  --   ALKPHOS 153*  --   BILITOT 0.4  --   PROT 9.1*  --   ALBUMIN 2.6* 2.2*   No results for input(s): LIPASE, AMYLASE in the last 168 hours. No results for input(s): AMMONIA in the last 168 hours. Coagulation Profile: Recent Labs  Lab 10/21/19 0031  INR 1.1   Cardiac Enzymes: Recent Labs  Lab 10/21/19 0031  CKTOTAL 28*   BNP (last 3 results) No  results for input(s): PROBNP in the last 8760 hours. HbA1C: Recent Labs    10/24/19 0820  HGBA1C 5.6   CBG: No  results for input(s): GLUCAP in the last 168 hours. Lipid Profile: No results for input(s): CHOL, HDL, LDLCALC, TRIG, CHOLHDL, LDLDIRECT in the last 72 hours. Thyroid Function Tests: No results for input(s): TSH, T4TOTAL, FREET4, T3FREE, THYROIDAB in the last 72 hours. Anemia Panel: No results for input(s): VITAMINB12, FOLATE, FERRITIN, TIBC, IRON, RETICCTPCT in the last 72 hours. Sepsis Labs: Recent Labs  Lab 10/21/19 0031  LATICACIDVEN 1.6    Recent Results (from the past 240 hour(s))  Wound or Superficial Culture     Status: None   Collection Time: 10/21/19 12:21 AM   Specimen: Wound  Result Value Ref Range Status   Specimen Description WOUND LEFT LEG  Final   Special Requests NONE  Final   Gram Stain   Final    ABUNDANT WBC PRESENT, PREDOMINANTLY PMN ABUNDANT GRAM POSITIVE COCCI IN PAIRS IN CHAINS RARE GRAM NEGATIVE RODS    Culture   Final    MODERATE GROUP A STREP (S.PYOGENES) ISOLATED Beta hemolytic streptococci are predictably susceptible to penicillin and other beta lactams. Susceptibility testing not routinely performed. Performed at [facility], [place] [address] [place], [place] [address]    Report Status 10/23/2019 FINAL  Final  Respiratory Panel by RT PCR (Flu A&B, Covid) - Nasopharyngeal Swab     Status: None   Collection Time: 10/21/19 12:40 AM   Specimen: Nasopharyngeal Swab  Result Value Ref Range Status   SARS Coronavirus 2 by RT PCR NEGATIVE NEGATIVE Final    Comment: (NOTE) SARS-CoV-2 target nucleic acids are NOT DETECTED.  The SARS-CoV-2 RNA is generally detectable in upper respiratoy specimens during the acute phase of infection. The lowest concentration of SARS-CoV-2 viral copies this assay can detect is 131 copies/mL. A negative result does not preclude SARS-Cov-2 infection and should not be used as the sole basis for treatment  or other patient management decisions. A negative result may occur with  improper specimen collection/handling, submission of specimen other than nasopharyngeal swab, presence of viral mutation(s) within the areas targeted by this assay, and inadequate number of viral copies (<131 copies/mL). A negative result must be combined with clinical observations, patient history, and epidemiological information. The expected result is Negative.  Fact Sheet for Patients:  PinkCheek.be  Fact Sheet for Healthcare Providers:  GravelBags.it  This test is no t yet approved or cleared by the [place] FDA and  has been authorized for detection and/or diagnosis of SARS-CoV-2 by FDA under an Emergency Use Authorization (EUA). This EUA will remain  in effect (meaning this test can be used) for the duration of the COVID-19 declaration under Section 564(b)(1) of the Act, 21 U.S.C. section 360bbb-3(b)(1), unless the authorization is terminated or revoked sooner.     Influenza A by PCR NEGATIVE NEGATIVE Final   Influenza B by PCR NEGATIVE NEGATIVE Final    Comment: (NOTE) The Xpert Xpress SARS-CoV-2/FLU/RSV assay is intended as an aid in  the diagnosis of influenza from Nasopharyngeal swab specimens and  should not be used as a sole basis for treatment. Nasal washings and  aspirates are unacceptable for Xpert Xpress SARS-CoV-2/FLU/RSV  testing.  Fact Sheet for Patients: PinkCheek.be  Fact Sheet for Healthcare Providers: GravelBags.it  This test is not yet approved or cleared by the [place] FDA and  has been authorized for detection and/or diagnosis of SARS-CoV-2 by  FDA under an Emergency Use Authorization (EUA). This EUA will remain  in effect (meaning this test can be used) for the duration of the  Covid-19 declaration under Section 564(b)(1) of the Act, 21  U.S.C.  section 360bbb-3(b)(1), unless the authorization is  terminated or revoked. Performed at [facility], [place] [address] [place], [place] [address]   Blood Culture (routine x 2)     Status: None (Preliminary result)   Collection Time: 10/21/19  1:23 AM   Specimen: BLOOD  Result Value Ref Range Status   Specimen Description BLOOD MIDLINE  Final   Special Requests   Final    BOTTLES DRAWN AEROBIC AND ANAEROBIC Blood Culture adequate volume   Culture   Final    NO GROWTH 4 DAYS Performed at [facility], [place] [address], [place], [place] [address]    Report Status PENDING  Incomplete  Blood Culture (routine x 2)     Status: None (Preliminary result)   Collection Time: 10/21/19  1:23 AM   Specimen: BLOOD  Result Value Ref Range Status   Specimen Description BLOOD MIDLINE  Final   Special Requests   Final    BOTTLES DRAWN AEROBIC AND ANAEROBIC Blood Culture adequate volume   Culture   Final    NO GROWTH 4 DAYS Performed at [facility], [place] [address], [place], [place] [address]    Report Status PENDING  Incomplete  Urine culture     Status: Abnormal   Collection Time: 10/21/19  5:29 AM   Specimen: Urine, Random  Result Value Ref Range Status   Specimen Description URINE, RANDOM  Final   Special Requests   Final    NONE Performed at [facility], [place] [address] [place], [place] [address]    Culture MULTIPLE SPECIES PRESENT, SUGGEST RECOLLECTION (A)  Final   Report Status 10/21/2019 FINAL  Final  Surgical pcr screen     Status: None   Collection Time: 10/21/19  6:48 PM   Specimen: Nasal Mucosa; Nasal Swab  Result Value Ref Range Status   MRSA, PCR NEGATIVE NEGATIVE Final   Staphylococcus aureus NEGATIVE NEGATIVE Final    Comment: (NOTE) The Xpert SA Assay (FDA approved for NASAL specimens in patients 18 years of age and older), is one component of a comprehensive surveillance program. It is not intended to diagnose infection nor to guide or monitor  treatment. Performed at [facility], [place] [address] [place], [place] [address]   Aerobic/Anaerobic Culture (surgical/deep wound)     Status: None (Preliminary result)   Collection Time: 10/22/19  8:37 AM   Specimen: Abscess  Result Value Ref Range Status   Specimen Description ABSCESS  Final   Special Requests LEFT SHIN SPEC A  Final   Gram Stain   Final    MODERATE WBC PRESENT,BOTH PMN AND MONONUCLEAR ABUNDANT GRAM POSITIVE COCCI IN PAIRS IN CHAINS IN CLUSTERS Performed at [facility], [place] [address] [place], [place] [address]    Culture   Final    ABUNDANT GROUP A STREP (S.PYOGENES) ISOLATED NO ANAEROBES ISOLATED; CULTURE IN PROGRESS FOR 5 DAYS    Report Status PENDING  Incomplete         Radiology Studies: No results found.      Scheduled Meds: . acetaminophen  500 mg Oral Q8H  . celecoxib  200 mg Oral BID  . docusate sodium  100 mg Oral BID  . enoxaparin (LOVENOX) injection  40 mg Subcutaneous Q24H  . feeding supplement (ENSURE ENLIVE)  237 mL Oral TID BM  . gabapentin  300 mg Oral TID  .  influenza vac split quadrivalent PF  0.5 mL Intramuscular Tomorrow-1000  . multivitamin with minerals  1 tablet Oral Daily  . sodium chloride flush  10-40 mL Intracatheter Q12H   Continuous Infusions: . sodium chloride 250 mL (10/24/19 2820)  . cefTRIAXone (ROCEPHIN)  IV    . methocarbamol (ROBAXIN) IV            Aline August, MD [facility] 10/25/2019, 1:30 PM

## 2019-10-25 NOTE — H&P (View-Only) (Signed)
Patient ID: [name] [name], female   DOB: September 08, 1980, 39 y.o.   MRN: [id number] Patient is postoperative day 3 irrigation and debridement left calf necrotizing fasciitis with group A strep the installation wound VAC is functioning well.  Plan for return to the operating room tomorrow for repeat irrigation and debridement and anticipate reapplying the installation wound VAC with return to the operating room on Friday for skin graft.

## 2019-10-25 NOTE — Plan of Care (Signed)

## 2019-10-26 ENCOUNTER — Inpatient Hospital Stay (HOSPITAL_COMMUNITY): Payer: Medicaid Other | Admitting: Anesthesiology

## 2019-10-26 ENCOUNTER — Encounter (HOSPITAL_COMMUNITY)
Admission: EM | Disposition: A | Discharge status: Home Health | Payer: Self-pay | Source: Home / Self Care | Attending: Internal Medicine

## 2019-10-26 DIAGNOSIS — M726 Necrotizing fasciitis: Secondary | ICD-10-CM | POA: Diagnosis not present

## 2019-10-26 DIAGNOSIS — B950 Streptococcus, group A, as the cause of diseases classified elsewhere: Secondary | ICD-10-CM | POA: Diagnosis not present

## 2019-10-26 DIAGNOSIS — T148XXA Other injury of unspecified body region, initial encounter: Secondary | ICD-10-CM | POA: Diagnosis not present

## 2019-10-26 DIAGNOSIS — F1910 Other psychoactive substance abuse, uncomplicated: Secondary | ICD-10-CM | POA: Diagnosis not present

## 2019-10-26 DIAGNOSIS — L089 Local infection of the skin and subcutaneous tissue, unspecified: Secondary | ICD-10-CM | POA: Diagnosis not present

## 2019-10-26 HISTORY — PX: I & D EXTREMITY: SHX5045

## 2019-10-26 LAB — CULTURE, BLOOD (ROUTINE X 2)
Culture: NO GROWTH
Culture: NO GROWTH
Special Requests: ADEQUATE
Special Requests: ADEQUATE

## 2019-10-26 SURGERY — IRRIGATION AND DEBRIDEMENT EXTREMITY
Anesthesia: General | Site: Leg Lower | Laterality: Left

## 2019-10-26 MED ORDER — DEXMEDETOMIDINE (PRECEDEX) IN NS 20 MCG/5ML (4 MCG/ML) IV SYRINGE
PREFILLED_SYRINGE | INTRAVENOUS | Status: DC | PRN
  Administered 2019-10-26: 8 ug via INTRAVENOUS
  Administered 2019-10-26: 12 ug via INTRAVENOUS
  Administered 2019-10-26: 8 ug via INTRAVENOUS
  Administered 2019-10-26: 4 ug via INTRAVENOUS
  Administered 2019-10-26: 8 ug via INTRAVENOUS

## 2019-10-26 MED ORDER — ONDANSETRON HCL 4 MG/2ML IJ SOLN: 4.0000 mg | Freq: Once | INTRAMUSCULAR | Status: DC | PRN

## 2019-10-26 MED ORDER — MIDAZOLAM HCL 2 MG/2ML IJ SOLN
INTRAMUSCULAR | Status: AC
  Filled 2019-10-26: qty 2

## 2019-10-26 MED ORDER — FENTANYL CITRATE (PF) 250 MCG/5ML IJ SOLN
INTRAMUSCULAR | Status: DC | PRN
Start: 2019-10-26 — End: 2019-10-26
  Administered 2019-10-26 (×2): 50 ug via INTRAVENOUS
  Administered 2019-10-26: 150 ug via INTRAVENOUS

## 2019-10-26 MED ORDER — ONDANSETRON HCL 4 MG/2ML IJ SOLN
INTRAMUSCULAR | Status: AC
  Filled 2019-10-26: qty 2

## 2019-10-26 MED ORDER — FENTANYL CITRATE (PF) 100 MCG/2ML IJ SOLN
INTRAMUSCULAR | Status: AC
  Filled 2019-10-26: qty 2

## 2019-10-26 MED ORDER — ACETAMINOPHEN 160 MG/5ML PO SOLN: 325.0000 mg | ORAL | Status: DC | PRN

## 2019-10-26 MED ORDER — FENTANYL CITRATE (PF) 100 MCG/2ML IJ SOLN
25.0000 ug | INTRAMUSCULAR | Status: DC | PRN
  Administered 2019-10-26 (×3): 50 ug via INTRAVENOUS

## 2019-10-26 MED ORDER — OXYCODONE HCL 5 MG/5ML PO SOLN: 5.0000 mg | Freq: Once | ORAL | Status: AC | PRN

## 2019-10-26 MED ORDER — FENTANYL CITRATE (PF) 250 MCG/5ML IJ SOLN
INTRAMUSCULAR | Status: AC
  Filled 2019-10-26: qty 5

## 2019-10-26 MED ORDER — HYDROMORPHONE HCL 1 MG/ML IJ SOLN
INTRAMUSCULAR | Status: AC
  Administered 2019-10-26: 0.5 mg
  Filled 2019-10-26: qty 1

## 2019-10-26 MED ORDER — ENOXAPARIN SODIUM 40 MG/0.4ML ~~LOC~~ SOLN: 40.0000 mg | SUBCUTANEOUS | Status: DC

## 2019-10-26 MED ORDER — SODIUM CHLORIDE 0.9 % IV SOLN: INTRAVENOUS | Status: DC

## 2019-10-26 MED ORDER — LIDOCAINE 2% (20 MG/ML) 5 ML SYRINGE
INTRAMUSCULAR | Status: AC
  Filled 2019-10-26: qty 5

## 2019-10-26 MED ORDER — HYDROMORPHONE HCL 1 MG/ML IJ SOLN
1.0000 mg | INTRAMUSCULAR | Status: DC | PRN
  Administered 2019-10-26 – 2019-11-01 (×40): 1 mg via INTRAVENOUS
  Filled 2019-10-26 (×41): qty 1

## 2019-10-26 MED ORDER — OXYCODONE HCL 5 MG PO TABS
ORAL_TABLET | ORAL | Status: AC
  Filled 2019-10-26: qty 1

## 2019-10-26 MED ORDER — EPHEDRINE 5 MG/ML INJ
INTRAVENOUS | Status: AC
  Filled 2019-10-26: qty 10

## 2019-10-26 MED ORDER — PHENYLEPHRINE 40 MCG/ML (10ML) SYRINGE FOR IV PUSH (FOR BLOOD PRESSURE SUPPORT)
PREFILLED_SYRINGE | INTRAVENOUS | Status: AC
  Filled 2019-10-26: qty 10

## 2019-10-26 MED ORDER — KETAMINE HCL 50 MG/5ML IJ SOSY
PREFILLED_SYRINGE | INTRAMUSCULAR | Status: AC
  Filled 2019-10-26: qty 5

## 2019-10-26 MED ORDER — MIDAZOLAM HCL 2 MG/2ML IJ SOLN
1.0000 mg | Freq: Once | INTRAMUSCULAR | Status: AC
  Administered 2019-10-26: 1 mg via INTRAVENOUS

## 2019-10-26 MED ORDER — HYDROMORPHONE HCL 1 MG/ML IJ SOLN
INTRAMUSCULAR | Status: AC
Start: 2019-10-26 — End: ?
  Filled 2019-10-26: qty 0.5

## 2019-10-26 MED ORDER — OXYCODONE HCL 5 MG PO TABS
5.0000 mg | ORAL_TABLET | Freq: Once | ORAL | Status: AC | PRN
  Administered 2019-10-26: 5 mg via ORAL

## 2019-10-26 MED ORDER — LIDOCAINE HCL (CARDIAC) PF 100 MG/5ML IV SOSY
PREFILLED_SYRINGE | INTRAVENOUS | Status: DC | PRN
  Administered 2019-10-26: 50 mg via INTRAVENOUS

## 2019-10-26 MED ORDER — ROCURONIUM BROMIDE 10 MG/ML (PF) SYRINGE
PREFILLED_SYRINGE | INTRAVENOUS | Status: AC
  Filled 2019-10-26: qty 10

## 2019-10-26 MED ORDER — PROPOFOL 10 MG/ML IV BOLUS
INTRAVENOUS | Status: DC | PRN
  Administered 2019-10-26 (×2): 200 mg via INTRAVENOUS

## 2019-10-26 MED ORDER — HYDROMORPHONE HCL 1 MG/ML IJ SOLN
INTRAMUSCULAR | Status: AC
  Filled 2019-10-26: qty 1

## 2019-10-26 MED ORDER — SUCCINYLCHOLINE CHLORIDE 200 MG/10ML IV SOSY
PREFILLED_SYRINGE | INTRAVENOUS | Status: AC
  Filled 2019-10-26: qty 10

## 2019-10-26 MED ORDER — ONDANSETRON HCL 4 MG/2ML IJ SOLN: INTRAMUSCULAR | Status: DC | PRN

## 2019-10-26 MED ORDER — HYDROMORPHONE HCL 1 MG/ML IJ SOLN
INTRAMUSCULAR | Status: AC
  Filled 2019-10-26: qty 0.5

## 2019-10-26 MED ORDER — PROPOFOL 10 MG/ML IV BOLUS
INTRAVENOUS | Status: AC
  Filled 2019-10-26: qty 20

## 2019-10-26 MED ORDER — KETAMINE HCL 10 MG/ML IJ SOLN
INTRAMUSCULAR | Status: DC | PRN
  Administered 2019-10-26: 40 mg via INTRAVENOUS

## 2019-10-26 MED ORDER — KETOROLAC TROMETHAMINE 15 MG/ML IJ SOLN
15.0000 mg | Freq: Four times a day (QID) | INTRAMUSCULAR | Status: AC
  Administered 2019-10-26 – 2019-10-27 (×5): 15 mg via INTRAVENOUS
  Filled 2019-10-26 (×5): qty 1

## 2019-10-26 MED ORDER — DEXAMETHASONE SODIUM PHOSPHATE 10 MG/ML IJ SOLN
INTRAMUSCULAR | Status: DC | PRN
  Administered 2019-10-26: 5 mg via INTRAVENOUS

## 2019-10-26 MED ORDER — DEXMEDETOMIDINE HCL 200 MCG/2ML IV SOLN: INTRAVENOUS | Status: DC | PRN

## 2019-10-26 MED ORDER — HYDROMORPHONE HCL 1 MG/ML IJ SOLN
INTRAMUSCULAR | Status: DC | PRN
  Administered 2019-10-26: 0.5 mg via INTRAVENOUS
  Administered 2019-10-26: 1 mg via INTRAVENOUS
  Administered 2019-10-26 (×2): .5 mg via INTRAVENOUS

## 2019-10-26 MED ORDER — 0.9 % SODIUM CHLORIDE (POUR BTL) OPTIME
TOPICAL | Status: DC | PRN
  Administered 2019-10-26: 1000 mL

## 2019-10-26 MED ORDER — HYDROMORPHONE HCL 1 MG/ML IJ SOLN
0.2500 mg | INTRAMUSCULAR | Status: DC | PRN
  Administered 2019-10-26 (×7): 0.5 mg via INTRAVENOUS

## 2019-10-26 MED ORDER — LACTATED RINGERS IV SOLN: INTRAVENOUS | Status: DC | PRN

## 2019-10-26 MED ORDER — MIDAZOLAM HCL 2 MG/2ML IJ SOLN
INTRAMUSCULAR | Status: DC | PRN
  Administered 2019-10-26: 2 mg via INTRAVENOUS
  Administered 2019-10-26 (×2): 1 mg via INTRAVENOUS

## 2019-10-26 MED ORDER — DEXAMETHASONE SODIUM PHOSPHATE 10 MG/ML IJ SOLN
INTRAMUSCULAR | Status: AC
  Filled 2019-10-26: qty 1

## 2019-10-26 MED ORDER — MEPERIDINE HCL 25 MG/ML IJ SOLN: 6.2500 mg | INTRAMUSCULAR | Status: DC | PRN

## 2019-10-26 MED ORDER — ACETAMINOPHEN 325 MG PO TABS: 325.0000 mg | ORAL_TABLET | ORAL | Status: DC | PRN

## 2019-10-26 MED ORDER — HYDROMORPHONE HCL 1 MG/ML IJ SOLN
INTRAMUSCULAR | Status: AC
  Administered 2019-10-26: 0.5 mg
  Filled 2019-10-26: qty 2

## 2019-10-26 SURGICAL SUPPLY — 52 items
BLADE SURG 21 STRL SS (BLADE) ×3 IMPLANT
BNDG COHESIVE 4X5 TAN STRL (GAUZE/BANDAGES/DRESSINGS) ×6 IMPLANT
BNDG COHESIVE 6X5 TAN STRL LF (GAUZE/BANDAGES/DRESSINGS) IMPLANT
BNDG GAUZE ELAST 4 BULKY (GAUZE/BANDAGES/DRESSINGS) ×2 IMPLANT
CANISTER WOUNDNEG PRESSURE 500 (CANNISTER) ×2 IMPLANT
CASSETTE VERAFLO VERALINK (MISCELLANEOUS) ×2 IMPLANT
CNTNR URN SCR LID CUP LEK RST (MISCELLANEOUS) IMPLANT
CONT SPEC 4OZ STRL OR WHT (MISCELLANEOUS) ×3
COVER SURGICAL LIGHT HANDLE (MISCELLANEOUS) ×2 IMPLANT
COVER WAND RF STERILE (DRAPES) IMPLANT
DRAPE DERMATAC (DRAPES) ×4 IMPLANT
DRAPE INCISE IOBAN 66X45 STRL (DRAPES) ×2 IMPLANT
DRAPE U-SHAPE 47X51 STRL (DRAPES) ×3 IMPLANT
DRESSING VERAFLO CLEANSE CC (GAUZE/BANDAGES/DRESSINGS) IMPLANT
DRSG ADAPTIC 3X8 NADH LF (GAUZE/BANDAGES/DRESSINGS) ×1 IMPLANT
DRSG VERAFLO CLEANSE CC (GAUZE/BANDAGES/DRESSINGS) ×3
DURAPREP 26ML APPLICATOR (WOUND CARE) ×3 IMPLANT
ELECT CAUTERY BLADE 6.4 (BLADE) ×2 IMPLANT
ELECT REM PT RETURN 9FT ADLT (ELECTROSURGICAL) ×9
ELECTRODE REM PT RTRN 9FT ADLT (ELECTROSURGICAL) IMPLANT
GAUZE SPONGE 4X4 12PLY STRL (GAUZE/BANDAGES/DRESSINGS) ×1 IMPLANT
GLOVE BIOGEL PI IND STRL 6.5 (GLOVE) IMPLANT
GLOVE BIOGEL PI IND STRL 7.0 (GLOVE) IMPLANT
GLOVE BIOGEL PI IND STRL 9 (GLOVE) ×1 IMPLANT
GLOVE BIOGEL PI INDICATOR 6.5 (GLOVE) ×2
GLOVE BIOGEL PI INDICATOR 7.0 (GLOVE) ×2
GLOVE BIOGEL PI INDICATOR 9 (GLOVE) ×4
GLOVE SURG ORTHO 9.0 STRL STRW (GLOVE) ×3 IMPLANT
GLOVE SURG SS PI 6.0 STRL IVOR (GLOVE) ×6 IMPLANT
GLOVE SURG SS PI 7.0 STRL IVOR (GLOVE) ×2 IMPLANT
GOWN STRL REUS W/ TWL LRG LVL3 (GOWN DISPOSABLE) IMPLANT
GOWN STRL REUS W/ TWL XL LVL3 (GOWN DISPOSABLE) ×2 IMPLANT
GOWN STRL REUS W/TWL LRG LVL3 (GOWN DISPOSABLE) ×3
GOWN STRL REUS W/TWL XL LVL3 (GOWN DISPOSABLE) ×6
HANDPIECE INTERPULSE COAX TIP (DISPOSABLE)
KIT BASIN OR (CUSTOM PROCEDURE TRAY) ×3 IMPLANT
KIT TURNOVER KIT B (KITS) ×3 IMPLANT
MANIFOLD NEPTUNE II (INSTRUMENTS) ×3 IMPLANT
NS IRRIG 1000ML POUR BTL (IV SOLUTION) ×3 IMPLANT
PACK ORTHO EXTREMITY (CUSTOM PROCEDURE TRAY) ×3 IMPLANT
PAD ARMBOARD 7.5X6 YLW CONV (MISCELLANEOUS) ×6 IMPLANT
SET HNDPC FAN SPRY TIP SCT (DISPOSABLE) IMPLANT
STOCKINETTE IMPERVIOUS 9X36 MD (GAUZE/BANDAGES/DRESSINGS) IMPLANT
STOCKINETTE IMPERVIOUS LG (DRAPES) ×2 IMPLANT
SUT ETHILON 2 0 PSLX (SUTURE) ×7 IMPLANT
SWAB COLLECTION DEVICE MRSA (MISCELLANEOUS) ×1 IMPLANT
SWAB CULTURE ESWAB REG 1ML (MISCELLANEOUS) IMPLANT
SYR BULB IRRIG 60ML STRL (SYRINGE) ×2 IMPLANT
TOWEL GREEN STERILE (TOWEL DISPOSABLE) ×3 IMPLANT
TUBE CONNECTING 12'X1/4 (SUCTIONS) ×1
TUBE CONNECTING 12X1/4 (SUCTIONS) ×2 IMPLANT
YANKAUER SUCT BULB TIP NO VENT (SUCTIONS) ×3 IMPLANT

## 2019-10-26 NOTE — Progress Notes (Signed)
Pt with complaint of pain rated 10/10 to surgical site. Paged md (duda) & pt to get 1mg  of dilaudid q2 and toradol 15mg  of toradol q6 x 5 doses.

## 2019-10-26 NOTE — Progress Notes (Signed)
Patient ID: [name] [name], female   DOB: 07-06-1980, 39 y.o.   MRN: [id number]  PROGRESS NOTE    [name] [name]  ERX:[id number] DOB: [birth year] DOA: 10/20/2019 PCP: [name], MD   Brief Narrative:   39 year old female with past medical history significant for IVDU, epidural abscess in October 2020, tobacco use, depression, anxiety and GERD was admitted with large infected left lower leg wound.  She was started on IV antibiotics.  She underwent debridement of the large left lower leg wound and placement of wound VAC.  Assessment & Plan:   Large lower leg wound infection/necrotizing fasciitis -Status post debridement and placement of wound VAC on 10/22/19.  Wound cultures growing strep positive.  Initially on vancomycin and cefepime.  Switched antibiotics to Rocephin on 10/25/2019. -Dr. [name] Given is planning for repeat surgical intervention today and on Friday.  Continue pain management.  Leukocytosis -Monitor  Left chest wall/breast cellulitis -Continue wound care and antibiotics plan as above  Substance abuse -Counseled to quit illicit drug use. -Continue to monitor for withdrawals.    DVT prophylaxis: SCDs Code Status:  Full code Family Communication: none at bedside Disposition Plan: Status is: Inpatient  Remains inpatient appropriate because:Inpatient level of care appropriate due to severity of illness   Dispo: The patient is from: Home              Anticipated d/c is to: Home              Anticipated d/c date is: > 3 days              Patient currently is not medically stable to d/c.   Consultants: Orthopedics  Procedures: debridement and placement of wound VAC on 10/22/19  Antimicrobials:  Anti-infectives (From admission, onward)   Start     Dose/Rate Route Frequency Ordered Stop   10/25/19 1400  cefTRIAXone (ROCEPHIN) 2 g in sodium chloride 0.9 % 100 mL IVPB        2 g 200 mL/hr over 30 Minutes Intravenous Every 24 hours 10/25/19 1127     10/24/19 1200   vancomycin (VANCOCIN) IVPB 1000 mg/200 mL premix  Status:  Discontinued        1,000 mg 200 mL/hr over 60 Minutes Intravenous Every 12 hours 10/24/19 1112 10/25/19 1127   10/22/19 1300  ceFAZolin (ANCEF) IVPB 1 g/50 mL premix  Status:  Discontinued        1 g 100 mL/hr over 30 Minutes Intravenous Every 6 hours 10/22/19 1211 10/22/19 1220   10/22/19 0600  ceFAZolin (ANCEF) IVPB 2g/100 mL premix  Status:  Discontinued        2 g 200 mL/hr over 30 Minutes Intravenous On call to O.R. 10/21/19 1840 10/22/19 0753   10/21/19 0800  vancomycin (VANCOCIN) IVPB 1000 mg/200 mL premix  Status:  Discontinued        1,000 mg 200 mL/hr over 60 Minutes Intravenous Every 8 hours 10/21/19 0432 10/24/19 1112   10/21/19 0600  ceFEPIme (MAXIPIME) 2 g in sodium chloride 0.9 % 100 mL IVPB  Status:  Discontinued        2 g 200 mL/hr over 30 Minutes Intravenous Every 8 hours 10/21/19 0406 10/25/19 1127   10/21/19 0100  aztreonam (AZACTAM) injection 2 g  Status:  Discontinued        2 g Intravenous Every 8 hours 10/21/19 0027 10/21/19 0039   10/21/19 0100  aztreonam (AZACTAM) 2 g in sodium chloride 0.9 % 100 mL IVPB  Status:  Discontinued        2 g 200 mL/hr over 30 Minutes Intravenous Every 8 hours 10/21/19 0039 10/21/19 0356   10/21/19 0030  vancomycin (VANCOCIN) IVPB 1000 mg/200 mL premix        1,000 mg 200 mL/hr over 60 Minutes Intravenous  Once 10/21/19 0027 10/21/19 4401      Subjective: Patient seen and examined at bedside.  No overnight fever, vomiting, worsening shortness of breath reported. Objective: Vitals:   10/25/19 1127 10/25/19 2049 10/26/19 0059 10/26/19 0356  BP: 117/78 123/72  113/63  Pulse: 80 81  84  Resp: 17 16  15   Temp: 98.3 F (36.8 C) 98.7 F (37.1 C)  98.5 F (36.9 C)  TempSrc:  Oral  Oral  SpO2: 100% 97%  98%  Weight:   94.4 kg   Height:        Intake/Output Summary (Last 24 hours) at 10/26/2019 0721 Last data filed at 10/26/2019 0300 Gross per 24 hour  Intake 1224.59  ml  Output 1300 ml  Net -75.41 ml   Filed Weights   10/22/19 0221 10/25/19 0429 10/26/19 0059  Weight: 89.7 kg 96.6 kg 94.4 kg    Examination:  General exam: No acute distress.  Looks chronically ill.  Poor historian. Respiratory system: Bilateral decreased breath sounds at bases with no wheezing Cardiovascular system: Rate controlled, S1-S2 heard Gastrointestinal system: Abdomen is obese, nondistended, soft and nontender.  Bowel sounds are heard  extremities: left leg dressing with wound VAC present.  No edema or clubbing.   Data Reviewed: I have personally reviewed following labs and imaging studies  CBC: Recent Labs  Lab 10/21/19 0031 10/22/19 1320 10/23/19 0640 10/24/19 0353  WBC 15.3* 10.9* 30.1* 23.0*  NEUTROABS 11.4*  --   --   --   HGB 10.7* 9.5* 9.8* 10.5*  HCT 33.0* 29.2* 29.5* 32.7*  MCV 88.9 86.9 86.8 88.4  PLT 441* 340 304 027   Basic Metabolic Panel: Recent Labs  Lab 10/21/19 0031 10/22/19 1320 10/23/19 0640 10/24/19 0353  NA 132*  --  134* 135  K 4.6  --  3.2* 3.8  CL 97*  --  101 103  CO2 27  --  23 23  GLUCOSE 104*  --  82 79  BUN 10  --  6 7  CREATININE 0.65 0.57 0.61 0.60  CALCIUM 9.0  --  8.3* 8.7*  MG  --   --   --  2.0  PHOS  --   --   --  2.4*   GFR: Estimated Creatinine Clearance: 99.1 mL/min (by C-G formula based on SCr of 0.6 mg/dL). Liver Function Tests: Recent Labs  Lab 10/21/19 0031 10/24/19 0353  AST 23  --   ALT 24  --   ALKPHOS 153*  --   BILITOT 0.4  --   PROT 9.1*  --   ALBUMIN 2.6* 2.2*   No results for input(s): LIPASE, AMYLASE in the last 168 hours. No results for input(s): AMMONIA in the last 168 hours. Coagulation Profile: Recent Labs  Lab 10/21/19 0031  INR 1.1   Cardiac Enzymes: Recent Labs  Lab 10/21/19 0031  CKTOTAL 28*   BNP (last 3 results) No results for input(s): PROBNP in the last 8760 hours. HbA1C: Recent Labs    10/24/19 0820  HGBA1C 5.6   CBG: No results for input(s): GLUCAP in  the last 168 hours. Lipid Profile: No results for input(s): CHOL, HDL, LDLCALC, TRIG, CHOLHDL, LDLDIRECT in the  last 72 hours. Thyroid Function Tests: No results for input(s): TSH, T4TOTAL, FREET4, T3FREE, THYROIDAB in the last 72 hours. Anemia Panel: No results for input(s): VITAMINB12, FOLATE, FERRITIN, TIBC, IRON, RETICCTPCT in the last 72 hours. Sepsis Labs: Recent Labs  Lab 10/21/19 0031  LATICACIDVEN 1.6    Recent Results (from the past 240 hour(s))  Wound or Superficial Culture     Status: None   Collection Time: 10/21/19 12:21 AM   Specimen: Wound  Result Value Ref Range Status   Specimen Description WOUND LEFT LEG  Final   Special Requests NONE  Final   Gram Stain   Final    ABUNDANT WBC PRESENT, PREDOMINANTLY PMN ABUNDANT GRAM POSITIVE COCCI IN PAIRS IN CHAINS RARE GRAM NEGATIVE RODS    Culture   Final    MODERATE GROUP A STREP (S.PYOGENES) ISOLATED Beta hemolytic streptococci are predictably susceptible to penicillin and other beta lactams. Susceptibility testing not routinely performed. Performed at [facility], [place] [address] [place], [place] [address]    Report Status 10/23/2019 FINAL  Final  Respiratory Panel by RT PCR (Flu A&B, Covid) - Nasopharyngeal Swab     Status: None   Collection Time: 10/21/19 12:40 AM   Specimen: Nasopharyngeal Swab  Result Value Ref Range Status   SARS Coronavirus 2 by RT PCR NEGATIVE NEGATIVE Final    Comment: (NOTE) SARS-CoV-2 target nucleic acids are NOT DETECTED.  The SARS-CoV-2 RNA is generally detectable in upper respiratoy specimens during the acute phase of infection. The lowest concentration of SARS-CoV-2 viral copies this assay can detect is 131 copies/mL. A negative result does not preclude SARS-Cov-2 infection and should not be used as the sole basis for treatment or other patient management decisions. A negative result may occur with  improper specimen collection/handling, submission of specimen other than  nasopharyngeal swab, presence of viral mutation(s) within the areas targeted by this assay, and inadequate number of viral copies (<131 copies/mL). A negative result must be combined with clinical observations, patient history, and epidemiological information. The expected result is Negative.  Fact Sheet for Patients:  PinkCheek.be  Fact Sheet for Healthcare Providers:  GravelBags.it  This test is no t yet approved or cleared by the [place] FDA and  has been authorized for detection and/or diagnosis of SARS-CoV-2 by FDA under an Emergency Use Authorization (EUA). This EUA will remain  in effect (meaning this test can be used) for the duration of the COVID-19 declaration under Section 564(b)(1) of the Act, 21 U.S.C. section 360bbb-3(b)(1), unless the authorization is terminated or revoked sooner.     Influenza A by PCR NEGATIVE NEGATIVE Final   Influenza B by PCR NEGATIVE NEGATIVE Final    Comment: (NOTE) The Xpert Xpress SARS-CoV-2/FLU/RSV assay is intended as an aid in  the diagnosis of influenza from Nasopharyngeal swab specimens and  should not be used as a sole basis for treatment. Nasal washings and  aspirates are unacceptable for Xpert Xpress SARS-CoV-2/FLU/RSV  testing.  Fact Sheet for Patients: PinkCheek.be  Fact Sheet for Healthcare Providers: GravelBags.it  This test is not yet approved or cleared by the [place] FDA and  has been authorized for detection and/or diagnosis of SARS-CoV-2 by  FDA under an Emergency Use Authorization (EUA). This EUA will remain  in effect (meaning this test can be used) for the duration of the  Covid-19 declaration under Section 564(b)(1) of the Act, 21  U.S.C. section 360bbb-3(b)(1), unless the authorization is  terminated or revoked. Performed at  [facility], [place] [address] [place],  [place] [address]   Blood Culture (routine x 2)     Status: None   Collection Time: 10/21/19  1:23 AM   Specimen: BLOOD  Result Value Ref Range Status   Specimen Description BLOOD MIDLINE  Final   Special Requests   Final    BOTTLES DRAWN AEROBIC AND ANAEROBIC Blood Culture adequate volume   Culture   Final    NO GROWTH 5 DAYS Performed at [facility], [place] [address] [place], [place] [address]    Report Status 10/26/2019 FINAL  Final  Blood Culture (routine x 2)     Status: None   Collection Time: 10/21/19  1:23 AM   Specimen: BLOOD  Result Value Ref Range Status   Specimen Description BLOOD MIDLINE  Final   Special Requests   Final    BOTTLES DRAWN AEROBIC AND ANAEROBIC Blood Culture adequate volume   Culture   Final    NO GROWTH 5 DAYS Performed at [facility], [place] [address] [place], [place] [address]    Report Status 10/26/2019 FINAL  Final  Urine culture     Status: Abnormal   Collection Time: 10/21/19  5:29 AM   Specimen: Urine, Random  Result Value Ref Range Status   Specimen Description URINE, RANDOM  Final   Special Requests   Final    NONE Performed at [facility], [place] [address], [place], [place] [address]    Culture MULTIPLE SPECIES PRESENT, SUGGEST RECOLLECTION (A)  Final   Report Status 10/21/2019 FINAL  Final  Surgical pcr screen     Status: None   Collection Time: 10/21/19  6:48 PM   Specimen: Nasal Mucosa; Nasal Swab  Result Value Ref Range Status   MRSA, PCR NEGATIVE NEGATIVE Final   Staphylococcus aureus NEGATIVE NEGATIVE Final    Comment: (NOTE) The Xpert SA Assay (FDA approved for NASAL specimens in patients 39 years of age and older), is one component of a comprehensive surveillance program. It is not intended to diagnose infection nor to guide or monitor treatment. Performed at [facility], [place] [address], [place], [place] [address]   Aerobic/Anaerobic Culture (surgical/deep wound)     Status: None (Preliminary result)    Collection Time: 10/22/19  8:37 AM   Specimen: Abscess  Result Value Ref Range Status   Specimen Description ABSCESS  Final   Special Requests LEFT SHIN SPEC A  Final   Gram Stain   Final    MODERATE WBC PRESENT,BOTH PMN AND MONONUCLEAR ABUNDANT GRAM POSITIVE COCCI IN PAIRS IN CHAINS IN CLUSTERS Performed at [facility], [place] [address], [place], [place] [address]    Culture   Final    ABUNDANT GROUP A STREP (S.PYOGENES) ISOLATED NO ANAEROBES ISOLATED; CULTURE IN PROGRESS FOR 5 DAYS    Report Status PENDING  Incomplete         Radiology Studies: No results found.      Scheduled Meds: . celecoxib  200 mg Oral BID  . docusate sodium  100 mg Oral BID  . enoxaparin (LOVENOX) injection  40 mg Subcutaneous Q24H  . feeding supplement (ENSURE ENLIVE)  237 mL Oral TID BM  . gabapentin  300 mg Oral TID  . influenza vac split quadrivalent PF  0.5 mL Intramuscular Tomorrow-1000  . multivitamin with minerals  1 tablet Oral Daily  . povidone-iodine  2 application Topical Once  . sodium chloride  flush  10-40 mL Intracatheter Q12H   Continuous Infusions: . sodium chloride Stopped (10/25/19 2006)  . cefTRIAXone (ROCEPHIN)  IV Stopped (10/25/19 1716)  . methocarbamol (ROBAXIN) IV            Aline August, MD [facility] 10/26/2019, 7:21 AM

## 2019-10-26 NOTE — Anesthesia Procedure Notes (Addendum)
Procedure Name: LMA Insertion Date/Time: 10/26/2019 8:45 AM Performed by: Kathryne Hitch, CRNA Pre-anesthesia Checklist: Patient identified, Suction available, Patient being monitored and Emergency Drugs available Patient Re-evaluated:Patient Re-evaluated prior to induction Oxygen Delivery Method: Circle system utilized Preoxygenation: Pre-oxygenation with 100% oxygen Induction Type: IV induction Ventilation: Mask ventilation without difficulty LMA: LMA inserted LMA Size: 4.0 Number of attempts: 1 Tube secured with: Tape Dental Injury: Teeth and Oropharynx as per pre-operative assessment  Comments: Inserted by [place]

## 2019-10-26 NOTE — Transfer of Care (Signed)
Immediate Anesthesia Transfer of Care Note  Patient: [name] [name]  Procedure(s) Performed: DEBRIDEMENT LEFT TIBIA (Left Leg Lower)  Patient Location: PACU  Anesthesia Type:General  Level of Consciousness: awake, alert , oriented and pateint uncooperative  Airway & Oxygen Therapy: Patient Spontanous Breathing  Post-op Assessment: Report given to RN and Post -op Vital signs reviewed and stable  Post vital signs: Reviewed and stable  Last Vitals:  Vitals Value Taken Time  BP 174/129 10/26/19 0942  Temp    Pulse 102 10/26/19 0943  Resp 19 10/26/19 0943  SpO2 100 % 10/26/19 0943  Vitals shown include unvalidated device data.  Last Pain:  Vitals:   10/26/19 0427  TempSrc:   PainSc: Asleep      Patients Stated Pain Goal: 3 (71/29/29 0903)  Complications: No complications documented.

## 2019-10-26 NOTE — Anesthesia Postprocedure Evaluation (Signed)
Anesthesia Post Note  Patient: [name] [name]  Procedure(s) Performed: DEBRIDEMENT LEFT TIBIA (Left Leg Lower)     Patient location during evaluation: PACU Anesthesia Type: General Level of consciousness: awake and alert, patient cooperative and oriented Pain management: pain level controlled (patient says her pain is very much improved) Vital Signs Assessment: post-procedure vital signs reviewed and stable Respiratory status: spontaneous breathing, nonlabored ventilation and respiratory function stable Cardiovascular status: blood pressure returned to baseline and stable Postop Assessment: no apparent nausea or vomiting Anesthetic complications: no   No complications documented.  Last Vitals:  Vitals:   10/26/19 1208 10/26/19 1243  BP: (!) 147/97   Pulse: 83   Resp: 18 18  Temp:  36.9 C  SpO2: 97%     Last Pain:  Vitals:   10/26/19 1243  TempSrc: Oral  PainSc: 10-Worst pain ever    LLE Motor Response: Purposeful movement (10/26/19 1253) LLE Sensation: Full sensation (10/26/19 1253)          Cortni Tays,E. Melissa Tomaselli

## 2019-10-26 NOTE — Interval H&P Note (Signed)
History and Physical Interval Note:  10/26/2019 6:47 AM  [name] [name]  has presented today for surgery, with the diagnosis of Necrotizing Fascitis Left Tibia.  The various methods of treatment have been discussed with the patient and family. After consideration of risks, benefits and other options for treatment, the patient has consented to  Procedure(s): DEBRIDEMENT LEFT TIBIA (Left) as a surgical intervention.  The patient's history has been reviewed, patient examined, no change in status, stable for surgery.  I have reviewed the patient's chart and labs.  Questions were answered to the patient's satisfaction.     [name]

## 2019-10-26 NOTE — Progress Notes (Signed)
OT Cancellation Note  Patient Details Name: [name] [name] MRN: [id number] DOB: 1980-02-22   Cancelled Treatment:    Reason Eval/Treat Not Completed: Patient at procedure or test/ unavailable (Pt in surgery.)  [name] 10/26/2019, 8:09 AM  Nestor Lewandowsky, OTR/L [place] Pager: [phone] Office: [phone]

## 2019-10-26 NOTE — Progress Notes (Signed)
PT Cancellation Note  Patient Details Name: [name] [name] MRN: [id number] DOB: Jan 28, 1980   Cancelled Treatment:    Reason Eval/Treat Not Completed: Other (comment) (Pt in OR for debridement of left tibia.  )   [name] 10/26/2019, 9:07 AM  [name],PT Acute Rehabilitation Services Pager:  [phone]  Office:  [phone]

## 2019-10-26 NOTE — Op Note (Signed)
10/26/2019  9:30 AM  PATIENT:  [name] [name]    PRE-OPERATIVE DIAGNOSIS:  Necrotizing Fascitis Left Tibia  POST-OPERATIVE DIAGNOSIS:  Same  PROCEDURE:  DEBRIDEMENT LEFT TIBIA Excision skin soft tissue muscle and fascia sharply with a final wound area of 19 x 9 cm. Application of cleanse choice installation wound VAC 2 sponges  SURGEON:  [name], MD  PHYSICIAN ASSISTANT:None ANESTHESIA:   General  PREOPERATIVE INDICATIONS:  [name] is a  39 y.o. female with a diagnosis of Necrotizing Fascitis Left Tibia who failed conservative measures and elected for surgical management.    The risks benefits and alternatives were discussed with the patient preoperatively including but not limited to the risks of infection, bleeding, nerve injury, cardiopulmonary complications, the need for revision surgery, among others, and the patient was willing to proceed.  OPERATIVE IMPLANTS: Cleanse choice wound VAC sponges x2  @ENCIMAGES @  OPERATIVE FINDINGS: Patient had significant improvement in her wound bed 10% fibrinous exudative tissue with epiboly around the wound edges  OPERATIVE PROCEDURE: Patient was brought the operating room and underwent a general anesthetic.  After adequate levels anesthesia were obtained patient's left lower extremity was prepped using DuraPrep draped into a sterile field a timeout was called.  The nonviable wound edges were excised sharply with a 21 blade knife this left a wound that was 19 x 9 cm.  A rondure Cobb and 21 blade knife was used to further debride the wound back to bleeding viable granulation tissue.  electrocautery was used for hemostasis.  The reticulated foam dressing was placed against the wound followed by the foam dressing sutures were placed to prevent tissue retraction.  This was then covered with derma tack Covan this had a good suction fit patient was extubated taken the PACU in stable condition   DISCHARGE PLANNING:  Antibiotic duration:  Continue IV antibiotics per infectious disease  Weightbearing: Physical therapy weightbearing as tolerated  Pain medication: Continue opioid pathway  Dressing care/ Wound VAC: Continue installation wound VAC  Ambulatory devices: Walker  Discharge to: Plan for return to the operating room on Friday for application of split-thickness skin graft.  Follow-up: In the office 1 week post operative.

## 2019-10-26 NOTE — Anesthesia Preprocedure Evaluation (Addendum)
Anesthesia Evaluation  Patient identified by MRN, date of birth, ID band Patient awake    Reviewed: Allergy & Precautions, NPO status , Patient's Chart, lab work & pertinent test results  History of Anesthesia Complications Negative for: history of anesthetic complications  Airway Mallampati: II  TM Distance: >3 FB Neck ROM: Full    Dental  (+) Partial Upper, Poor Dentition, Missing, Dental Advisory Given, Chipped   Pulmonary Current Smoker and Patient abstained from smoking.,  10/21/2019 SARS coronavirus NEG   breath sounds clear to auscultation       Cardiovascular negative cardio ROS   Rhythm:Regular Rate:Normal     Neuro/Psych  Headaches, Anxiety Depression S/p epidural abscess 2020    GI/Hepatic GERD  Controlled,(+)     substance abuse  cocaine use and IV drug use,   Endo/Other  Morbid obesity  Renal/GU negative Renal ROS     Musculoskeletal  (+) Arthritis , narcotic dependent  Abdominal (+) + obese,   Peds  Hematology  (+) Blood dyscrasia (Hb 10.5), anemia ,   Anesthesia Other Findings   Reproductive/Obstetrics                            Anesthesia Physical Anesthesia Plan  ASA: III  Anesthesia Plan: General   Post-op Pain Management:    Induction: Intravenous  PONV Risk Score and Plan: 3 and Ondansetron and Dexamethasone  Airway Management Planned: LMA  Additional Equipment: None  Intra-op Plan:   Post-operative Plan:   Informed Consent: I have reviewed the patients History and Physical, chart, labs and discussed the procedure including the risks, benefits and alternatives for the proposed anesthesia with the patient or authorized representative who has indicated his/her understanding and acceptance.     Dental advisory given  Plan Discussed with: CRNA and Surgeon  Anesthesia Plan Comments:        Anesthesia Quick Evaluation

## 2019-10-27 ENCOUNTER — Encounter (HOSPITAL_COMMUNITY): Payer: Self-pay | Admitting: Orthopedic Surgery

## 2019-10-27 ENCOUNTER — Other Ambulatory Visit: Payer: Self-pay | Admitting: Physician Assistant

## 2019-10-27 DIAGNOSIS — T148XXA Other injury of unspecified body region, initial encounter: Secondary | ICD-10-CM | POA: Diagnosis not present

## 2019-10-27 DIAGNOSIS — M726 Necrotizing fasciitis: Secondary | ICD-10-CM | POA: Diagnosis not present

## 2019-10-27 DIAGNOSIS — F1910 Other psychoactive substance abuse, uncomplicated: Secondary | ICD-10-CM | POA: Diagnosis not present

## 2019-10-27 DIAGNOSIS — L089 Local infection of the skin and subcutaneous tissue, unspecified: Secondary | ICD-10-CM | POA: Diagnosis not present

## 2019-10-27 LAB — CBC WITH DIFFERENTIAL/PLATELET
Abs Immature Granulocytes: 0.59 10*3/uL — ABNORMAL HIGH (ref 0.00–0.07)
Basophils Absolute: 0.1 10*3/uL (ref 0.0–0.1)
Basophils Relative: 1 %
Eosinophils Absolute: 0.2 10*3/uL (ref 0.0–0.5)
Eosinophils Relative: 1 %
HCT: 26.4 % — ABNORMAL LOW (ref 36.0–46.0)
Hemoglobin: 8.3 g/dL — ABNORMAL LOW (ref 12.0–15.0)
Immature Granulocytes: 4 %
Lymphocytes Relative: 26 %
Lymphs Abs: 3.6 10*3/uL (ref 0.7–4.0)
MCH: 28.2 pg (ref 26.0–34.0)
MCHC: 31.4 g/dL (ref 30.0–36.0)
MCV: 89.8 fL (ref 80.0–100.0)
Monocytes Absolute: 1 10*3/uL (ref 0.1–1.0)
Monocytes Relative: 7 %
Neutro Abs: 8.7 10*3/uL — ABNORMAL HIGH (ref 1.7–7.7)
Neutrophils Relative %: 61 %
Platelets: 327 10*3/uL (ref 150–400)
RBC: 2.94 MIL/uL — ABNORMAL LOW (ref 3.87–5.11)
RDW: 14.6 % (ref 11.5–15.5)
WBC: 14.2 10*3/uL — ABNORMAL HIGH (ref 4.0–10.5)
nRBC: 0 % (ref 0.0–0.2)

## 2019-10-27 LAB — BASIC METABOLIC PANEL
Anion gap: 9 (ref 5–15)
BUN: 8 mg/dL (ref 6–20)
CO2: 24 mmol/L (ref 22–32)
Calcium: 8.4 mg/dL — ABNORMAL LOW (ref 8.9–10.3)
Chloride: 103 mmol/L (ref 98–111)
Creatinine, Ser: 0.69 mg/dL (ref 0.44–1.00)
GFR calc non Af Amer: >60 mL/min (ref >60)
Glucose, Bld: 159 mg/dL — ABNORMAL HIGH (ref 70–99)
Potassium: 3.5 mmol/L (ref 3.5–5.1)
Sodium: 136 mmol/L (ref 135–145)

## 2019-10-27 LAB — AEROBIC/ANAEROBIC CULTURE W GRAM STAIN (SURGICAL/DEEP WOUND)

## 2019-10-27 LAB — MAGNESIUM: Magnesium: 1.9 mg/dL (ref 1.7–2.4)

## 2019-10-27 LAB — C-REACTIVE PROTEIN: CRP: 2.2 mg/dL — ABNORMAL HIGH (ref <1.0)

## 2019-10-27 MED ORDER — ENOXAPARIN SODIUM 40 MG/0.4ML ~~LOC~~ SOLN
40.0000 mg | SUBCUTANEOUS | Status: DC
  Administered 2019-10-30 – 2019-11-01 (×3): 40 mg via SUBCUTANEOUS
  Filled 2019-10-27 (×5): qty 0.4

## 2019-10-27 MED ORDER — SENNOSIDES-DOCUSATE SODIUM 8.6-50 MG PO TABS
1.0000 | ORAL_TABLET | Freq: Two times a day (BID) | ORAL | Status: DC
  Administered 2019-10-27 – 2019-11-01 (×8): 1 via ORAL
  Filled 2019-10-27 (×9): qty 1

## 2019-10-27 NOTE — Progress Notes (Signed)
PT Cancellation Note  Patient Details Name: [name] [name] MRN: [id number] DOB: [birth year]   Cancelled Treatment:    Reason Eval/Treat Not Completed: Other (comment);Pain limiting ability to participate.  Pt refused therapy for entire day, reporting pain is too great and has notified nursing about her issue.  States she is going to surgery tomorrow and will reconsider then.   Ramond Dial 10/27/2019, 9:58 AM   Mee Hives, PT MS Acute Rehab Dept. Number: [place] and [place]

## 2019-10-27 NOTE — Progress Notes (Signed)
Patient ID: [name] [name], female   DOB: 14-Mar-1980, 39 y.o.   MRN: [id number]  PROGRESS NOTE    [name] [name]  AOZ:[id number] DOB: [birth year] DOA: 10/20/2019 PCP: [name], MD   Brief Narrative:   39 year old female with past medical history significant for IVDU, epidural abscess in October 2020, tobacco use, depression, anxiety and GERD was admitted with large infected left lower leg wound.  She was started on IV antibiotics.  She underwent debridement of the large left lower leg wound and placement of wound VAC.  Assessment & Plan:   Large left lower leg wound infection/necrotizing fasciitis -Status post debridement and placement of wound VAC on 10/22/19.  Wound cultures growing strep positive.  Initially on vancomycin and cefepime.  Switched antibiotics to Rocephin on 10/25/2019. -Status post repeat debridement on 10/26/2019 with plan for more surgical intervention on 10/28/2019.  Continue postop pain management as per orthopedics. -CRP improving to 2.2 today.  Leukocytosis -Improving.  Normocytic anemia/anemia of chronic disease -Probably from above.  Hemoglobin stable.  Monitor  Left chest wall/breast cellulitis -Continue wound care and antibiotics plan as above  Substance abuse -Counseled to quit illicit drug use. -Continue to monitor for withdrawals.    DVT prophylaxis: SCDs Code Status:  Full code Family Communication: none at bedside Disposition Plan: Status is: Inpatient  Remains inpatient appropriate because:Inpatient level of care appropriate due to severity of illness   Dispo: The patient is from: Home              Anticipated d/c is to: Home              Anticipated d/c date is: > 3 days              Patient currently is not medically stable to d/c.   Consultants: Orthopedics  Procedures: debridement and placement of wound VAC on 10/22/19 Repeat debridement on 10/26/2019  Antimicrobials:  Anti-infectives (From admission, onward)   Start      Dose/Rate Route Frequency Ordered Stop   10/25/19 1400  cefTRIAXone (ROCEPHIN) 2 g in sodium chloride 0.9 % 100 mL IVPB        2 g 200 mL/hr over 30 Minutes Intravenous Every 24 hours 10/25/19 1127     10/24/19 1200  vancomycin (VANCOCIN) IVPB 1000 mg/200 mL premix  Status:  Discontinued        1,000 mg 200 mL/hr over 60 Minutes Intravenous Every 12 hours 10/24/19 1112 10/25/19 1127   10/22/19 1300  ceFAZolin (ANCEF) IVPB 1 g/50 mL premix  Status:  Discontinued        1 g 100 mL/hr over 30 Minutes Intravenous Every 6 hours 10/22/19 1211 10/22/19 1220   10/22/19 0600  ceFAZolin (ANCEF) IVPB 2g/100 mL premix  Status:  Discontinued        2 g 200 mL/hr over 30 Minutes Intravenous On call to O.R. 10/21/19 1840 10/22/19 0753   10/21/19 0800  vancomycin (VANCOCIN) IVPB 1000 mg/200 mL premix  Status:  Discontinued        1,000 mg 200 mL/hr over 60 Minutes Intravenous Every 8 hours 10/21/19 0432 10/24/19 1112   10/21/19 0600  ceFEPIme (MAXIPIME) 2 g in sodium chloride 0.9 % 100 mL IVPB  Status:  Discontinued        2 g 200 mL/hr over 30 Minutes Intravenous Every 8 hours 10/21/19 0406 10/25/19 1127   10/21/19 0100  aztreonam (AZACTAM) injection 2 g  Status:  Discontinued  2 g Intravenous Every 8 hours 10/21/19 0027 10/21/19 0039   10/21/19 0100  aztreonam (AZACTAM) 2 g in sodium chloride 0.9 % 100 mL IVPB  Status:  Discontinued        2 g 200 mL/hr over 30 Minutes Intravenous Every 8 hours 10/21/19 0039 10/21/19 0356   10/21/19 0030  vancomycin (VANCOCIN) IVPB 1000 mg/200 mL premix        1,000 mg 200 mL/hr over 60 Minutes Intravenous  Once 10/21/19 0027 10/21/19 1517      Subjective: Patient seen and examined at bedside.  Complains of left lower extremity pain.  No overnight fever, vomiting or worsening shortness of breath reported.   Objective: Vitals:   10/26/19 1208 10/26/19 1243 10/26/19 2024 10/27/19 0439  BP: (!) 147/97  (!) 124/92 138/82  Pulse: 83  (!) 102 (!) 105  Resp: 18  18 18 16   Temp:  98.5 F (36.9 C) 99.8 F (37.7 C) 98.3 F (36.8 C)  TempSrc:  Oral Oral Oral  SpO2: 97%  97% 98%  Weight:    92.4 kg  Height:        Intake/Output Summary (Last 24 hours) at 10/27/2019 0737 Last data filed at 10/27/2019 0300 Gross per 24 hour  Intake 1774.73 ml  Output 2455 ml  Net -680.27 ml   Filed Weights   10/25/19 0429 10/26/19 0059 10/27/19 0439  Weight: 96.6 kg 94.4 kg 92.4 kg    Examination:  General exam: No distress.  Looks chronically ill.  Poor historian. Respiratory system: Bilateral decreased breath sounds at bases with some scattered crackles. Cardiovascular system: S1-S2 heard, intermittently tachycardic.   Gastrointestinal system: Abdomen is obese, nondistended, soft and nontender.  Normal bowel sounds are heard  extremities: No cyanosis or clubbing.  Left lower extremity dressing with wound VAC present   Data Reviewed: I have personally reviewed following labs and imaging studies  CBC: Recent Labs  Lab 10/21/19 0031 10/22/19 1320 10/23/19 0640 10/24/19 0353 10/27/19 0552  WBC 15.3* 10.9* 30.1* 23.0* 14.2*  NEUTROABS 11.4*  --   --   --  8.7*  HGB 10.7* 9.5* 9.8* 10.5* 8.3*  HCT 33.0* 29.2* 29.5* 32.7* 26.4*  MCV 88.9 86.9 86.8 88.4 89.8  PLT 441* 340 304 335 616   Basic Metabolic Panel: Recent Labs  Lab 10/21/19 0031 10/22/19 1320 10/23/19 0640 10/24/19 0353 10/27/19 0552  NA 132*  --  134* 135 136  K 4.6  --  3.2* 3.8 3.5  CL 97*  --  101 103 103  CO2 27  --  23 23 24   GLUCOSE 104*  --  82 79 159*  BUN 10  --  6 7 8   CREATININE 0.65 0.57 0.61 0.60 0.69  CALCIUM 9.0  --  8.3* 8.7* 8.4*  MG  --   --   --  2.0 1.9  PHOS  --   --   --  2.4*  --    GFR: Estimated Creatinine Clearance: 97.9 mL/min (by C-G formula based on SCr of 0.69 mg/dL). Liver Function Tests: Recent Labs  Lab 10/21/19 0031 10/24/19 0353  AST 23  --   ALT 24  --   ALKPHOS 153*  --   BILITOT 0.4  --   PROT 9.1*  --   ALBUMIN 2.6* 2.2*   No  results for input(s): LIPASE, AMYLASE in the last 168 hours. No results for input(s): AMMONIA in the last 168 hours. Coagulation Profile: Recent Labs  Lab 10/21/19 0031  INR 1.1   Cardiac Enzymes: Recent Labs  Lab 10/21/19 0031  CKTOTAL 28*   BNP (last 3 results) No results for input(s): PROBNP in the last 8760 hours. HbA1C: Recent Labs    10/24/19 0820  HGBA1C 5.6   CBG: No results for input(s): GLUCAP in the last 168 hours. Lipid Profile: No results for input(s): CHOL, HDL, LDLCALC, TRIG, CHOLHDL, LDLDIRECT in the last 72 hours. Thyroid Function Tests: No results for input(s): TSH, T4TOTAL, FREET4, T3FREE, THYROIDAB in the last 72 hours. Anemia Panel: No results for input(s): VITAMINB12, FOLATE, FERRITIN, TIBC, IRON, RETICCTPCT in the last 72 hours. Sepsis Labs: Recent Labs  Lab 10/21/19 0031  LATICACIDVEN 1.6    Recent Results (from the past 240 hour(s))  Wound or Superficial Culture     Status: None   Collection Time: 10/21/19 12:21 AM   Specimen: Wound  Result Value Ref Range Status   Specimen Description WOUND LEFT LEG  Final   Special Requests NONE  Final   Gram Stain   Final    ABUNDANT WBC PRESENT, PREDOMINANTLY PMN ABUNDANT GRAM POSITIVE COCCI IN PAIRS IN CHAINS RARE GRAM NEGATIVE RODS    Culture   Final    MODERATE GROUP A STREP (S.PYOGENES) ISOLATED Beta hemolytic streptococci are predictably susceptible to penicillin and other beta lactams. Susceptibility testing not routinely performed. Performed at [facility], [place] [address] [place], [place] [address]    Report Status 10/23/2019 FINAL  Final  Respiratory Panel by RT PCR (Flu A&B, Covid) - Nasopharyngeal Swab     Status: None   Collection Time: 10/21/19 12:40 AM   Specimen: Nasopharyngeal Swab  Result Value Ref Range Status   SARS Coronavirus 2 by RT PCR NEGATIVE NEGATIVE Final    Comment: (NOTE) SARS-CoV-2 target nucleic acids are NOT DETECTED.  The SARS-CoV-2 RNA is generally  detectable in upper respiratoy specimens during the acute phase of infection. The lowest concentration of SARS-CoV-2 viral copies this assay can detect is 131 copies/mL. A negative result does not preclude SARS-Cov-2 infection and should not be used as the sole basis for treatment or other patient management decisions. A negative result may occur with  improper specimen collection/handling, submission of specimen other than nasopharyngeal swab, presence of viral mutation(s) within the areas targeted by this assay, and inadequate number of viral copies (<131 copies/mL). A negative result must be combined with clinical observations, patient history, and epidemiological information. The expected result is Negative.  Fact Sheet for Patients:  PinkCheek.be  Fact Sheet for Healthcare Providers:  GravelBags.it  This test is no t yet approved or cleared by the [place] FDA and  has been authorized for detection and/or diagnosis of SARS-CoV-2 by FDA under an Emergency Use Authorization (EUA). This EUA will remain  in effect (meaning this test can be used) for the duration of the COVID-19 declaration under Section 564(b)(1) of the Act, 21 U.S.C. section 360bbb-3(b)(1), unless the authorization is terminated or revoked sooner.     Influenza A by PCR NEGATIVE NEGATIVE Final   Influenza B by PCR NEGATIVE NEGATIVE Final    Comment: (NOTE) The Xpert Xpress SARS-CoV-2/FLU/RSV assay is intended as an aid in  the diagnosis of influenza from Nasopharyngeal swab specimens and  should not be used as a sole basis for treatment. Nasal washings and  aspirates are unacceptable for Xpert Xpress SARS-CoV-2/FLU/RSV  testing.  Fact Sheet for Patients: PinkCheek.be  Fact Sheet for Healthcare Providers: GravelBags.it  This test is not yet approved  or cleared by the [place] and    has been authorized for detection and/or diagnosis of SARS-CoV-2 by  FDA under an Emergency Use Authorization (EUA). This EUA will remain  in effect (meaning this test can be used) for the duration of the  Covid-19 declaration under Section 564(b)(1) of the Act, 21  U.S.C. section 360bbb-3(b)(1), unless the authorization is  terminated or revoked. Performed at [facility], [place] [address], [place], [place] [address]   Blood Culture (routine x 2)     Status: None   Collection Time: 10/21/19  1:23 AM   Specimen: BLOOD  Result Value Ref Range Status   Specimen Description BLOOD MIDLINE  Final   Special Requests   Final    BOTTLES DRAWN AEROBIC AND ANAEROBIC Blood Culture adequate volume   Culture   Final    NO GROWTH 5 DAYS Performed at [facility], [place] [address] [place], [place] [address]    Report Status 10/26/2019 FINAL  Final  Blood Culture (routine x 2)     Status: None   Collection Time: 10/21/19  1:23 AM   Specimen: BLOOD  Result Value Ref Range Status   Specimen Description BLOOD MIDLINE  Final   Special Requests   Final    BOTTLES DRAWN AEROBIC AND ANAEROBIC Blood Culture adequate volume   Culture   Final    NO GROWTH 5 DAYS Performed at [facility], [place] [address], [place], [place] [address]    Report Status 10/26/2019 FINAL  Final  Urine culture     Status: Abnormal   Collection Time: 10/21/19  5:29 AM   Specimen: Urine, Random  Result Value Ref Range Status   Specimen Description URINE, RANDOM  Final   Special Requests   Final    NONE Performed at [facility], [place] [address], [place], [place] [address]    Culture MULTIPLE SPECIES PRESENT, SUGGEST RECOLLECTION (A)  Final   Report Status 10/21/2019 FINAL  Final  Surgical pcr screen     Status: None   Collection Time: 10/21/19  6:48 PM   Specimen: Nasal Mucosa; Nasal Swab  Result Value Ref Range Status   MRSA, PCR NEGATIVE NEGATIVE Final   Staphylococcus aureus NEGATIVE NEGATIVE  Final    Comment: (NOTE) The Xpert SA Assay (FDA approved for NASAL specimens in patients 51 years of age and older), is one component of a comprehensive surveillance program. It is not intended to diagnose infection nor to guide or monitor treatment. Performed at [facility], [place] [address] [place], [place] [address]   Aerobic/Anaerobic Culture (surgical/deep wound)     Status: None (Preliminary result)   Collection Time: 10/22/19  8:37 AM   Specimen: Abscess  Result Value Ref Range Status   Specimen Description ABSCESS  Final   Special Requests LEFT SHIN SPEC A  Final   Gram Stain   Final    MODERATE WBC PRESENT,BOTH PMN AND MONONUCLEAR ABUNDANT GRAM POSITIVE COCCI IN PAIRS IN CHAINS IN CLUSTERS Performed at [facility], [place] [address] [place], [place] [address]    Culture   Final    ABUNDANT GROUP A STREP (S.PYOGENES) ISOLATED NO ANAEROBES ISOLATED; CULTURE IN PROGRESS FOR 5 DAYS    Report Status PENDING  Incomplete  Aerobic/Anaerobic Culture (surgical/deep wound)     Status: None (Preliminary result)   Collection Time: 10/26/19  8:28 AM   Specimen: Soft Tissue, Other  Result Value  Ref Range Status   Specimen Description TISSUE  Final   Special Requests LEFT TIBIA SPEC A  Final   Gram Stain   Final    ABUNDANT WBC PRESENT,BOTH PMN AND MONONUCLEAR RARE GRAM POSITIVE COCCI Performed at [facility], [place] [address] [place], [place] [address]    Culture PENDING  Incomplete   Report Status PENDING  Incomplete         Radiology Studies: No results found.      Scheduled Meds: . celecoxib  200 mg Oral BID  . docusate sodium  100 mg Oral BID  . [START ON 10/28/2019] enoxaparin (LOVENOX) injection  40 mg Subcutaneous Q24H  . feeding supplement (ENSURE ENLIVE)  237 mL Oral TID BM  . gabapentin  300 mg Oral TID  . influenza vac split quadrivalent PF  0.5 mL Intramuscular Tomorrow-1000  . ketorolac  15 mg Intravenous Q6H  . multivitamin with  minerals  1 tablet Oral Daily  . sodium chloride flush  10-40 mL Intracatheter Q12H   Continuous Infusions: . sodium chloride Stopped (10/25/19 2006)  . sodium chloride 75 mL/hr at 10/27/19 0300  . cefTRIAXone (ROCEPHIN)  IV Stopped (10/26/19 1314)  . methocarbamol (ROBAXIN) IV            Aline August, MD [facility] 10/27/2019, 7:37 AM

## 2019-10-27 NOTE — Consult Note (Signed)
[facility] Nurse Consult Note: Patient receiving care in 3E27. Reason for Consult: "Wound VAC; Patient has wound vac placed post wound debridement of left foot. Dressing has large clot underneath that needs to be changed. No orders for dressing changes. Please advise nursing on what supplies are needed and if bedside RN can change vac dressing". Wound type: Per Dr. [name] note from 10/06 "PROCEDURE:  DEBRIDEMENT LEFT TIBIA Excision skin soft tissue muscle and fascia sharply with a final wound area of 19 x 9 cm. Application of cleanse choice installation wound VAC 2 sponges"  I instructed RN Danae Chen to contact the surgical team for guidance on the [facility] issues. Pressure Injury POA: Yes/No/NA Measurement: Wound bed: Drainage (amount, consistency, odor)  Periwound: Dressing procedure/placement/frequency: Val Riles, RN, MSN, CWOCN, CNS-BC, pager [phone]

## 2019-10-27 NOTE — Progress Notes (Signed)
Patient alert and awake stable she had some trouble with pain but now better controlled.  Vital signs stable.  There is 100 cc suctioning into the wound VAC.  Will discuss with Dr. [name] Given.  Plan for return to surgery tomorrow

## 2019-10-27 NOTE — Progress Notes (Signed)
OT Cancellation Note  Patient Details Name: [name] [name] MRN: [id number] DOB: 29-Oct-1980   Cancelled Treatment:    Reason Eval/Treat Not Completed: Pain limiting ability to participate (pt refused any activity)  [name] 10/27/2019, 10:37 AM  Nestor Lewandowsky, OTR/L Acute Rehabilitation Services Pager: [phone] Office: [phone]

## 2019-10-28 ENCOUNTER — Encounter (HOSPITAL_COMMUNITY): Payer: Self-pay | Admitting: Internal Medicine

## 2019-10-28 ENCOUNTER — Inpatient Hospital Stay (HOSPITAL_COMMUNITY): Payer: Medicaid Other | Admitting: Anesthesiology

## 2019-10-28 ENCOUNTER — Encounter (HOSPITAL_COMMUNITY)
Admission: EM | Disposition: A | Discharge status: Home Health | Payer: Self-pay | Source: Home / Self Care | Attending: Internal Medicine

## 2019-10-28 DIAGNOSIS — F1910 Other psychoactive substance abuse, uncomplicated: Secondary | ICD-10-CM | POA: Diagnosis not present

## 2019-10-28 DIAGNOSIS — T148XXA Other injury of unspecified body region, initial encounter: Secondary | ICD-10-CM | POA: Diagnosis not present

## 2019-10-28 DIAGNOSIS — M726 Necrotizing fasciitis: Secondary | ICD-10-CM | POA: Diagnosis not present

## 2019-10-28 DIAGNOSIS — L089 Local infection of the skin and subcutaneous tissue, unspecified: Secondary | ICD-10-CM | POA: Diagnosis not present

## 2019-10-28 HISTORY — PX: I & D EXTREMITY: SHX5045

## 2019-10-28 HISTORY — PX: APPLICATION OF WOUND VAC: SHX5189

## 2019-10-28 SURGERY — IRRIGATION AND DEBRIDEMENT EXTREMITY
Anesthesia: General | Site: Leg Lower | Laterality: Left

## 2019-10-28 MED ORDER — DEXAMETHASONE SODIUM PHOSPHATE 10 MG/ML IJ SOLN
INTRAMUSCULAR | Status: AC
  Filled 2019-10-28: qty 1

## 2019-10-28 MED ORDER — MIDAZOLAM HCL 2 MG/2ML IJ SOLN
0.5000 mg | INTRAMUSCULAR | Status: DC | PRN
  Administered 2019-10-28 (×2): 1 mg via INTRAVENOUS

## 2019-10-28 MED ORDER — DEXAMETHASONE SODIUM PHOSPHATE 10 MG/ML IJ SOLN
INTRAMUSCULAR | Status: DC | PRN
  Administered 2019-10-28: 4 mg via INTRAVENOUS

## 2019-10-28 MED ORDER — FENTANYL CITRATE (PF) 250 MCG/5ML IJ SOLN
INTRAMUSCULAR | Status: DC | PRN
Start: 2019-10-28 — End: 2019-10-28
  Administered 2019-10-28 (×5): 50 ug via INTRAVENOUS

## 2019-10-28 MED ORDER — KETOROLAC TROMETHAMINE 15 MG/ML IJ SOLN
15.0000 mg | Freq: Four times a day (QID) | INTRAMUSCULAR | Status: AC
  Administered 2019-10-28 – 2019-10-29 (×5): 15 mg via INTRAVENOUS
  Filled 2019-10-28 (×5): qty 1

## 2019-10-28 MED ORDER — MIDAZOLAM HCL 2 MG/2ML IJ SOLN
INTRAMUSCULAR | Status: AC
  Filled 2019-10-28: qty 2

## 2019-10-28 MED ORDER — 0.9 % SODIUM CHLORIDE (POUR BTL) OPTIME
TOPICAL | Status: DC | PRN
  Administered 2019-10-28: 2000 mL

## 2019-10-28 MED ORDER — CHLORHEXIDINE GLUCONATE 0.12 % MT SOLN
OROMUCOSAL | Status: AC
  Administered 2019-10-28: 15 mL via OROMUCOSAL
  Filled 2019-10-28: qty 15

## 2019-10-28 MED ORDER — HYDROMORPHONE HCL 1 MG/ML IJ SOLN
INTRAMUSCULAR | Status: AC
  Filled 2019-10-28: qty 1

## 2019-10-28 MED ORDER — DEXMEDETOMIDINE HCL 200 MCG/2ML IV SOLN
20.0000 ug | INTRAVENOUS | Status: AC | PRN
  Administered 2019-10-28 (×2): 20 ug via INTRAVENOUS

## 2019-10-28 MED ORDER — ONDANSETRON HCL 4 MG/2ML IJ SOLN
INTRAMUSCULAR | Status: DC | PRN
  Administered 2019-10-28: 4 mg via INTRAVENOUS

## 2019-10-28 MED ORDER — OXYCODONE HCL 5 MG/5ML PO SOLN: 5.0000 mg | Freq: Once | ORAL | Status: DC | PRN

## 2019-10-28 MED ORDER — HYDROMORPHONE HCL 1 MG/ML IJ SOLN
INTRAMUSCULAR | Status: AC
Start: 2019-10-28 — End: 2019-10-29
  Filled 2019-10-28: qty 2

## 2019-10-28 MED ORDER — ACETAMINOPHEN 160 MG/5ML PO SOLN: 325.0000 mg | ORAL | Status: DC | PRN

## 2019-10-28 MED ORDER — SODIUM CHLORIDE 0.9 % IV SOLN: INTRAVENOUS | Status: DC

## 2019-10-28 MED ORDER — DEXMEDETOMIDINE (PRECEDEX) IN NS 20 MCG/5ML (4 MCG/ML) IV SYRINGE
PREFILLED_SYRINGE | INTRAVENOUS | Status: DC | PRN
  Administered 2019-10-28: 4 ug via INTRAVENOUS
  Administered 2019-10-28: 8 ug via INTRAVENOUS
  Administered 2019-10-28: 4 ug via INTRAVENOUS
  Administered 2019-10-28: 8 ug via INTRAVENOUS
  Administered 2019-10-28: 4 ug via INTRAVENOUS
  Administered 2019-10-28: 8 ug via INTRAVENOUS
  Administered 2019-10-28: 4 ug via INTRAVENOUS

## 2019-10-28 MED ORDER — HYDROMORPHONE HCL 1 MG/ML IJ SOLN
0.2500 mg | INTRAMUSCULAR | Status: AC | PRN
  Administered 2019-10-28 (×4): 0.5 mg via INTRAVENOUS

## 2019-10-28 MED ORDER — LIDOCAINE 2% (20 MG/ML) 5 ML SYRINGE
INTRAMUSCULAR | Status: DC | PRN
  Administered 2019-10-28: 60 mg via INTRAVENOUS

## 2019-10-28 MED ORDER — FENTANYL CITRATE (PF) 100 MCG/2ML IJ SOLN
INTRAMUSCULAR | Status: AC
  Filled 2019-10-28: qty 2

## 2019-10-28 MED ORDER — MIDAZOLAM HCL 2 MG/2ML IJ SOLN
INTRAMUSCULAR | Status: DC | PRN
  Administered 2019-10-28: 2 mg via INTRAVENOUS

## 2019-10-28 MED ORDER — PROPOFOL 10 MG/ML IV BOLUS
INTRAVENOUS | Status: DC | PRN
  Administered 2019-10-28: 20 mg via INTRAVENOUS
  Administered 2019-10-28: 30 mg via INTRAVENOUS
  Administered 2019-10-28: 180 mg via INTRAVENOUS

## 2019-10-28 MED ORDER — METHOCARBAMOL 500 MG PO TABS
ORAL_TABLET | ORAL | Status: AC
  Filled 2019-10-28: qty 2

## 2019-10-28 MED ORDER — LIDOCAINE 2% (20 MG/ML) 5 ML SYRINGE
INTRAMUSCULAR | Status: AC
  Filled 2019-10-28: qty 5

## 2019-10-28 MED ORDER — HYDROMORPHONE HCL 1 MG/ML IJ SOLN
1.0000 mg | INTRAMUSCULAR | Status: AC
  Administered 2019-10-28 (×2): 1 mg via INTRAVENOUS

## 2019-10-28 MED ORDER — MEPERIDINE HCL 25 MG/ML IJ SOLN: 6.2500 mg | INTRAMUSCULAR | Status: DC | PRN

## 2019-10-28 MED ORDER — CHLORHEXIDINE GLUCONATE 0.12 % MT SOLN
15.0000 mL | Freq: Once | OROMUCOSAL | Status: AC
  Filled 2019-10-28: qty 15

## 2019-10-28 MED ORDER — CELECOXIB 200 MG PO CAPS
200.0000 mg | ORAL_CAPSULE | Freq: Two times a day (BID) | ORAL | Status: DC
  Administered 2019-10-30 – 2019-11-01 (×5): 200 mg via ORAL
  Filled 2019-10-28 (×5): qty 1

## 2019-10-28 MED ORDER — DEXMEDETOMIDINE (PRECEDEX) IN NS 20 MCG/5ML (4 MCG/ML) IV SYRINGE
PREFILLED_SYRINGE | INTRAVENOUS | Status: AC
  Filled 2019-10-28: qty 5

## 2019-10-28 MED ORDER — ONDANSETRON HCL 4 MG/2ML IJ SOLN
INTRAMUSCULAR | Status: AC
  Filled 2019-10-28: qty 2

## 2019-10-28 MED ORDER — FENTANYL CITRATE (PF) 250 MCG/5ML IJ SOLN
INTRAMUSCULAR | Status: AC
  Filled 2019-10-28: qty 5

## 2019-10-28 MED ORDER — OXYCODONE HCL 5 MG PO TABS: 5.0000 mg | ORAL_TABLET | Freq: Once | ORAL | Status: DC | PRN

## 2019-10-28 MED ORDER — ACETAMINOPHEN 325 MG PO TABS: 325.0000 mg | ORAL_TABLET | ORAL | Status: DC | PRN

## 2019-10-28 MED ORDER — ONDANSETRON HCL 4 MG/2ML IJ SOLN: 4.0000 mg | Freq: Once | INTRAMUSCULAR | Status: DC | PRN

## 2019-10-28 MED ORDER — FENTANYL CITRATE (PF) 100 MCG/2ML IJ SOLN
25.0000 ug | INTRAMUSCULAR | Status: DC | PRN
  Administered 2019-10-28 (×3): 50 ug via INTRAVENOUS

## 2019-10-28 MED ORDER — LACTATED RINGERS IV SOLN: INTRAVENOUS | Status: DC

## 2019-10-28 SURGICAL SUPPLY — 42 items
ALLOGRAFT SKIN MESHD 387 SQ CM (Graft) ×1 IMPLANT
BLADE SURG 21 STRL SS (BLADE) ×3 IMPLANT
BNDG COHESIVE 6X5 TAN STRL LF (GAUZE/BANDAGES/DRESSINGS) IMPLANT
BNDG GAUZE ELAST 4 BULKY (GAUZE/BANDAGES/DRESSINGS) ×6 IMPLANT
CANISTER WOUNDNEG PRESSURE 500 (CANNISTER) ×2 IMPLANT
COVER SURGICAL LIGHT HANDLE (MISCELLANEOUS) ×6 IMPLANT
COVER WAND RF STERILE (DRAPES) IMPLANT
DRAPE DERMATAC (DRAPES) ×2 IMPLANT
DRAPE U-SHAPE 47X51 STRL (DRAPES) ×3 IMPLANT
DRESSING VERAFLO CLEANSE CC (GAUZE/BANDAGES/DRESSINGS) IMPLANT
DRSG ADAPTIC 3X8 NADH LF (GAUZE/BANDAGES/DRESSINGS) ×3 IMPLANT
DRSG VAC ATS SM SENSATRAC (GAUZE/BANDAGES/DRESSINGS) ×2 IMPLANT
DRSG VERAFLO CLEANSE CC (GAUZE/BANDAGES/DRESSINGS) ×3
DURAPREP 26ML APPLICATOR (WOUND CARE) ×3 IMPLANT
ELECT REM PT RETURN 9FT ADLT (ELECTROSURGICAL)
ELECTRODE REM PT RTRN 9FT ADLT (ELECTROSURGICAL) IMPLANT
GAUZE SPONGE 4X4 12PLY STRL (GAUZE/BANDAGES/DRESSINGS) ×3 IMPLANT
GLOVE BIOGEL PI IND STRL 9 (GLOVE) ×1 IMPLANT
GLOVE BIOGEL PI INDICATOR 9 (GLOVE) ×2
GLOVE SURG ORTHO 9.0 STRL STRW (GLOVE) ×3 IMPLANT
GOWN STRL REUS W/ TWL XL LVL3 (GOWN DISPOSABLE) ×2 IMPLANT
GOWN STRL REUS W/TWL XL LVL3 (GOWN DISPOSABLE) ×6
GRAFT TISS MESH 387 BURN (Graft) IMPLANT
HANDPIECE INTERPULSE COAX TIP (DISPOSABLE)
KIT BASIN OR (CUSTOM PROCEDURE TRAY) ×3 IMPLANT
KIT TURNOVER KIT B (KITS) ×3 IMPLANT
MANIFOLD NEPTUNE II (INSTRUMENTS) ×3 IMPLANT
NS IRRIG 1000ML POUR BTL (IV SOLUTION) ×3 IMPLANT
PACK ORTHO EXTREMITY (CUSTOM PROCEDURE TRAY) ×3 IMPLANT
PAD ARMBOARD 7.5X6 YLW CONV (MISCELLANEOUS) ×6 IMPLANT
SET HNDPC FAN SPRY TIP SCT (DISPOSABLE) IMPLANT
SKIN MESHED 387 SQ CM (Graft) ×3 IMPLANT
SNARE SHORT THROW 13M SML OVAL (MISCELLANEOUS) ×2 IMPLANT
STAPLER VISISTAT 35W (STAPLE) ×2 IMPLANT
STOCKINETTE IMPERVIOUS 9X36 MD (GAUZE/BANDAGES/DRESSINGS) IMPLANT
SUT ETHILON 2 0 PSLX (SUTURE) ×7 IMPLANT
SWAB COLLECTION DEVICE MRSA (MISCELLANEOUS) ×3 IMPLANT
SWAB CULTURE ESWAB REG 1ML (MISCELLANEOUS) IMPLANT
TOWEL GREEN STERILE (TOWEL DISPOSABLE) ×3 IMPLANT
TUBE CONNECTING 12'X1/4 (SUCTIONS) ×1
TUBE CONNECTING 12X1/4 (SUCTIONS) ×2 IMPLANT
YANKAUER SUCT BULB TIP NO VENT (SUCTIONS) ×3 IMPLANT

## 2019-10-28 NOTE — Progress Notes (Signed)
Patient ID: [name] [name], female   DOB: May 03, 1980, 39 y.o.   MRN: [id number]  PROGRESS NOTE    [name] [name]  XLK:[id number] DOB: Jul 16, 1980 DOA: 10/20/2019 PCP: [name], MD   Brief Narrative:   39 year old female with past medical history significant for IVDU, epidural abscess in October 2020, tobacco use, depression, anxiety and GERD was admitted with large infected left lower leg wound.  She was started on IV antibiotics.  She underwent debridement of the large left lower leg wound and placement of wound VAC.  Assessment & Plan:   Large left lower leg wound infection/necrotizing fasciitis -Status post debridement and placement of wound VAC on 10/22/19.  Wound cultures growing strep positive.  Initially on vancomycin and cefepime.  Switched antibiotics to Rocephin on 10/25/2019. -Status post repeat debridement on 10/26/2019 with plan for more surgical intervention today.  Continue postop pain management as per orthopedics.  Leukocytosis -Improving.  No labs today.  Normocytic anemia/anemia of chronic disease -Probably from above.  Hemoglobin stable.  Monitor  Left chest wall/breast cellulitis -Continue wound care and antibiotics plan as above  Substance abuse -Counseled to quit illicit drug use. -Continue to monitor for withdrawals.    DVT prophylaxis: SCDs Code Status:  Full code Family Communication: none at bedside Disposition Plan: Status is: Inpatient  Remains inpatient appropriate because:Inpatient level of care appropriate due to severity of illness   Dispo: The patient is from: Home              Anticipated d/c is to: Home              Anticipated d/c date is: > 3 days              Patient currently is not medically stable to d/c.   Consultants: Orthopedics  Procedures: debridement and placement of wound VAC on 10/22/19 Repeat debridement on 10/26/2019  Antimicrobials:  Anti-infectives (From admission, onward)   Start     Dose/Rate Route Frequency  Ordered Stop   10/25/19 1400  cefTRIAXone (ROCEPHIN) 2 g in sodium chloride 0.9 % 100 mL IVPB        2 g 200 mL/hr over 30 Minutes Intravenous Every 24 hours 10/25/19 1127     10/24/19 1200  vancomycin (VANCOCIN) IVPB 1000 mg/200 mL premix  Status:  Discontinued        1,000 mg 200 mL/hr over 60 Minutes Intravenous Every 12 hours 10/24/19 1112 10/25/19 1127   10/22/19 1300  ceFAZolin (ANCEF) IVPB 1 g/50 mL premix  Status:  Discontinued        1 g 100 mL/hr over 30 Minutes Intravenous Every 6 hours 10/22/19 1211 10/22/19 1220   10/22/19 0600  ceFAZolin (ANCEF) IVPB 2g/100 mL premix  Status:  Discontinued        2 g 200 mL/hr over 30 Minutes Intravenous On call to O.R. 10/21/19 1840 10/22/19 0753   10/21/19 0800  vancomycin (VANCOCIN) IVPB 1000 mg/200 mL premix  Status:  Discontinued        1,000 mg 200 mL/hr over 60 Minutes Intravenous Every 8 hours 10/21/19 0432 10/24/19 1112   10/21/19 0600  ceFEPIme (MAXIPIME) 2 g in sodium chloride 0.9 % 100 mL IVPB  Status:  Discontinued        2 g 200 mL/hr over 30 Minutes Intravenous Every 8 hours 10/21/19 0406 10/25/19 1127   10/21/19 0100  aztreonam (AZACTAM) injection 2 g  Status:  Discontinued        2 g  Intravenous Every 8 hours 10/21/19 0027 10/21/19 0039   10/21/19 0100  aztreonam (AZACTAM) 2 g in sodium chloride 0.9 % 100 mL IVPB  Status:  Discontinued        2 g 200 mL/hr over 30 Minutes Intravenous Every 8 hours 10/21/19 0039 10/21/19 0356   10/21/19 0030  vancomycin (VANCOCIN) IVPB 1000 mg/200 mL premix        1,000 mg 200 mL/hr over 60 Minutes Intravenous  Once 10/21/19 0027 10/21/19 3086      Subjective: Patient seen and examined at bedside.  No overnight fever, worsening shortness of breath reported.  Complains of lower extremity pain..   Objective: Vitals:   10/27/19 1230 10/27/19 1738 10/27/19 1932 10/28/19 0327  BP: 125/76 128/82 125/81 (!) 135/91  Pulse: 96 97 91 100  Resp: 16 17 16 16   Temp: 98.2 F (36.8 C) 98.3 F  (36.8 C) 99.1 F (37.3 C) 98.9 F (37.2 C)  TempSrc: Oral Oral Oral Oral  SpO2: 97% 97% 100% 100%  Weight:      Height:        Intake/Output Summary (Last 24 hours) at 10/28/2019 0800 Last data filed at 10/28/2019 0634 Gross per 24 hour  Intake 1159.08 ml  Output 1540 ml  Net -380.92 ml   Filed Weights   10/25/19 0429 10/26/19 0059 10/27/19 0439  Weight: 96.6 kg 94.4 kg 92.4 kg    Examination:  General exam: No acute distress.  Poor historian.  Chronically ill looking. Respiratory system: Bilateral decreased breath sounds at bases with no wheezing cardiovascular system: Rate controlled, S1-S2 heard Gastrointestinal system: Abdomen is obese, nondistended, soft and nontender.  Bowel sounds are heard extremities: Left lower extremity has dressing present with wound VAC.  No clubbing.  Data Reviewed: I have personally reviewed following labs and imaging studies  CBC: Recent Labs  Lab 10/22/19 1320 10/23/19 0640 10/24/19 0353 10/27/19 0552  WBC 10.9* 30.1* 23.0* 14.2*  NEUTROABS  --   --   --  8.7*  HGB 9.5* 9.8* 10.5* 8.3*  HCT 29.2* 29.5* 32.7* 26.4*  MCV 86.9 86.8 88.4 89.8  PLT 340 304 335 578   Basic Metabolic Panel: Recent Labs  Lab 10/22/19 1320 10/23/19 0640 10/24/19 0353 10/27/19 0552  NA  --  134* 135 136  K  --  3.2* 3.8 3.5  CL  --  101 103 103  CO2  --  23 23 24   GLUCOSE  --  82 79 159*  BUN  --  6 7 8   CREATININE 0.57 0.61 0.60 0.69  CALCIUM  --  8.3* 8.7* 8.4*  MG  --   --  2.0 1.9  PHOS  --   --  2.4*  --    GFR: Estimated Creatinine Clearance: 97.9 mL/min (by C-G formula based on SCr of 0.69 mg/dL). Liver Function Tests: Recent Labs  Lab 10/24/19 0353  ALBUMIN 2.2*   No results for input(s): LIPASE, AMYLASE in the last 168 hours. No results for input(s): AMMONIA in the last 168 hours. Coagulation Profile: No results for input(s): INR, PROTIME in the last 168 hours. Cardiac Enzymes: No results for input(s): CKTOTAL, CKMB, CKMBINDEX,  TROPONINI in the last 168 hours. BNP (last 3 results) No results for input(s): PROBNP in the last 8760 hours. HbA1C: No results for input(s): HGBA1C in the last 72 hours. CBG: No results for input(s): GLUCAP in the last 168 hours. Lipid Profile: No results for input(s): CHOL, HDL, LDLCALC, TRIG, CHOLHDL, LDLDIRECT in  the last 72 hours. Thyroid Function Tests: No results for input(s): TSH, T4TOTAL, FREET4, T3FREE, THYROIDAB in the last 72 hours. Anemia Panel: No results for input(s): VITAMINB12, FOLATE, FERRITIN, TIBC, IRON, RETICCTPCT in the last 72 hours. Sepsis Labs: No results for input(s): PROCALCITON, LATICACIDVEN in the last 168 hours.  Recent Results (from the past 240 hour(s))  Wound or Superficial Culture     Status: None   Collection Time: 10/21/19 12:21 AM   Specimen: Wound  Result Value Ref Range Status   Specimen Description WOUND LEFT LEG  Final   Special Requests NONE  Final   Gram Stain   Final    ABUNDANT WBC PRESENT, PREDOMINANTLY PMN ABUNDANT GRAM POSITIVE COCCI IN PAIRS IN CHAINS RARE GRAM NEGATIVE RODS    Culture   Final    MODERATE GROUP A STREP (S.PYOGENES) ISOLATED Beta hemolytic streptococci are predictably susceptible to penicillin and other beta lactams. Susceptibility testing not routinely performed. Performed at [facility], [place] [address], [place], [place] [address]    Report Status 10/23/2019 FINAL  Final  Respiratory Panel by RT PCR (Flu A&B, Covid) - Nasopharyngeal Swab     Status: None   Collection Time: 10/21/19 12:40 AM   Specimen: Nasopharyngeal Swab  Result Value Ref Range Status   SARS Coronavirus 2 by RT PCR NEGATIVE NEGATIVE Final    Comment: (NOTE) SARS-CoV-2 target nucleic acids are NOT DETECTED.  The SARS-CoV-2 RNA is generally detectable in upper respiratoy specimens during the acute phase of infection. The lowest concentration of SARS-CoV-2 viral copies this assay can detect is 131 copies/mL. A negative result does not  preclude SARS-Cov-2 infection and should not be used as the sole basis for treatment or other patient management decisions. A negative result may occur with  improper specimen collection/handling, submission of specimen other than nasopharyngeal swab, presence of viral mutation(s) within the areas targeted by this assay, and inadequate number of viral copies (<131 copies/mL). A negative result must be combined with clinical observations, patient history, and epidemiological information. The expected result is Negative.  Fact Sheet for Patients:  PinkCheek.be  Fact Sheet for Healthcare Providers:  GravelBags.it  This test is no t yet approved or cleared by the [place] FDA and  has been authorized for detection and/or diagnosis of SARS-CoV-2 by FDA under an Emergency Use Authorization (EUA). This EUA will remain  in effect (meaning this test can be used) for the duration of the COVID-19 declaration under Section 564(b)(1) of the Act, 21 U.S.C. section 360bbb-3(b)(1), unless the authorization is terminated or revoked sooner.     Influenza A by PCR NEGATIVE NEGATIVE Final   Influenza B by PCR NEGATIVE NEGATIVE Final    Comment: (NOTE) The Xpert Xpress SARS-CoV-2/FLU/RSV assay is intended as an aid in  the diagnosis of influenza from Nasopharyngeal swab specimens and  should not be used as a sole basis for treatment. Nasal washings and  aspirates are unacceptable for Xpert Xpress SARS-CoV-2/FLU/RSV  testing.  Fact Sheet for Patients: PinkCheek.be  Fact Sheet for Healthcare Providers: GravelBags.it  This test is not yet approved or cleared by the [place] FDA and  has been authorized for detection and/or diagnosis of SARS-CoV-2 by  FDA under an Emergency Use Authorization (EUA). This EUA will remain  in effect (meaning this test can be used) for the  duration of the  Covid-19 declaration under Section 564(b)(1) of the Act, 21  U.S.C. section 360bbb-3(b)(1), unless the authorization is  terminated or revoked.  Performed at [facility], [place] [address], [place], [place] [address]   Blood Culture (routine x 2)     Status: None   Collection Time: 10/21/19  1:23 AM   Specimen: BLOOD  Result Value Ref Range Status   Specimen Description BLOOD MIDLINE  Final   Special Requests   Final    BOTTLES DRAWN AEROBIC AND ANAEROBIC Blood Culture adequate volume   Culture   Final    NO GROWTH 5 DAYS Performed at [facility], [place] [address] [place], [place] [address]    Report Status 10/26/2019 FINAL  Final  Blood Culture (routine x 2)     Status: None   Collection Time: 10/21/19  1:23 AM   Specimen: BLOOD  Result Value Ref Range Status   Specimen Description BLOOD MIDLINE  Final   Special Requests   Final    BOTTLES DRAWN AEROBIC AND ANAEROBIC Blood Culture adequate volume   Culture   Final    NO GROWTH 5 DAYS Performed at [facility], [place] [address], [place], [place] [address]    Report Status 10/26/2019 FINAL  Final  Urine culture     Status: Abnormal   Collection Time: 10/21/19  5:29 AM   Specimen: Urine, Random  Result Value Ref Range Status   Specimen Description URINE, RANDOM  Final   Special Requests   Final    NONE Performed at [facility], [place] [address], [place], [place] [address]    Culture MULTIPLE SPECIES PRESENT, SUGGEST RECOLLECTION (A)  Final   Report Status 10/21/2019 FINAL  Final  Surgical pcr screen     Status: None   Collection Time: 10/21/19  6:48 PM   Specimen: Nasal Mucosa; Nasal Swab  Result Value Ref Range Status   MRSA, PCR NEGATIVE NEGATIVE Final   Staphylococcus aureus NEGATIVE NEGATIVE Final    Comment: (NOTE) The Xpert SA Assay (FDA approved for NASAL specimens in patients 41 years of age and older), is one component of a comprehensive surveillance program. It is not  intended to diagnose infection nor to guide or monitor treatment. Performed at [facility], [place] [address], [place], [place] [address]   Aerobic/Anaerobic Culture (surgical/deep wound)     Status: None   Collection Time: 10/22/19  8:37 AM   Specimen: Abscess  Result Value Ref Range Status   Specimen Description ABSCESS  Final   Special Requests LEFT SHIN SPEC A  Final   Gram Stain   Final    MODERATE WBC PRESENT,BOTH PMN AND MONONUCLEAR ABUNDANT GRAM POSITIVE COCCI IN PAIRS IN CHAINS IN CLUSTERS    Culture   Final    ABUNDANT GROUP A STREP (S.PYOGENES) ISOLATED NO ANAEROBES ISOLATED Performed at [facility], [place] [address], [place], [place] [address]    Report Status 10/27/2019 FINAL  Final  Aerobic/Anaerobic Culture (surgical/deep wound)     Status: None (Preliminary result)   Collection Time: 10/26/19  8:28 AM   Specimen: Soft Tissue, Other  Result Value Ref Range Status   Specimen Description TISSUE  Final   Special Requests LEFT TIBIA SPEC A  Final   Gram Stain   Final    ABUNDANT WBC PRESENT,BOTH PMN AND MONONUCLEAR RARE GRAM POSITIVE COCCI    Culture   Final    NO GROWTH 1 DAY Performed at [facility], [place] [address] [place], [place] [address]    Report Status PENDING  Incomplete  Radiology Studies: No results found.      Scheduled Meds: . celecoxib  200 mg Oral BID  . [START ON 10/30/2019] enoxaparin (LOVENOX) injection  40 mg Subcutaneous Q24H  . feeding supplement (ENSURE ENLIVE)  237 mL Oral TID BM  . gabapentin  300 mg Oral TID  . influenza vac split quadrivalent PF  0.5 mL Intramuscular Tomorrow-1000  . multivitamin with minerals  1 tablet Oral Daily  . senna-docusate  1 tablet Oral BID  . sodium chloride flush  10-40 mL Intracatheter Q12H   Continuous Infusions: . sodium chloride Stopped (10/25/19 2006)  . sodium chloride 75 mL/hr at 10/27/19 1756  . cefTRIAXone (ROCEPHIN)  IV 2 g (10/27/19 1332)  . methocarbamol  (ROBAXIN) IV            Aline August, MD [facility] 10/28/2019, 8:00 AM

## 2019-10-28 NOTE — Anesthesia Postprocedure Evaluation (Signed)
Anesthesia Post Note  Patient: Kjersti Dittmer  Procedure(s) Performed: REPEAT DEBRIDEMENT LEFT TIBIA (Left Leg Lower)     Patient location during evaluation: PACU Anesthesia Type: General Level of consciousness: awake and alert Pain management: pain level controlled Vital Signs Assessment: post-procedure vital signs reviewed and stable Respiratory status: spontaneous breathing, nonlabored ventilation, respiratory function stable and patient connected to nasal cannula oxygen Cardiovascular status: blood pressure returned to baseline and stable Postop Assessment: no apparent nausea or vomiting Anesthetic complications: no   No complications documented.  Last Vitals:  Vitals:   10/28/19 1354 10/28/19 1412  BP: (!) 145/91 (!) 135/100  Pulse: 97 94  Resp: 15 16  Temp: 36.7 C 36.7 C  SpO2: 97% 100%    Last Pain:  Vitals:   10/28/19 1412  TempSrc: Oral  PainSc: 10-Worst pain ever                 Brenetta Penny

## 2019-10-28 NOTE — Anesthesia Preprocedure Evaluation (Signed)
Anesthesia Evaluation  Patient identified by MRN, date of birth, ID band Patient awake    Reviewed: Allergy & Precautions, NPO status , Patient's Chart, lab work & pertinent test results  History of Anesthesia Complications Negative for: history of anesthetic complications  Airway Mallampati: II  TM Distance: >3 FB Neck ROM: Full    Dental  (+) Partial Upper, Poor Dentition, Missing, Dental Advisory Given, Chipped   Pulmonary Current Smoker and Patient abstained from smoking.,  10/21/2019 SARS coronavirus NEG   breath sounds clear to auscultation       Cardiovascular negative cardio ROS   Rhythm:Regular Rate:Normal     Neuro/Psych  Headaches, Anxiety Depression S/p epidural abscess 2020    GI/Hepatic GERD  Controlled,(+)     substance abuse  cocaine use and IV drug use,   Endo/Other  Morbid obesity  Renal/GU negative Renal ROS     Musculoskeletal  (+) Arthritis , narcotic dependent  Abdominal (+) + obese,   Peds  Hematology  (+) Blood dyscrasia (Hb 10.5), anemia ,   Anesthesia Other Findings   Reproductive/Obstetrics                             Anesthesia Physical  Anesthesia Plan  ASA: III  Anesthesia Plan: General   Post-op Pain Management:    Induction: Intravenous  PONV Risk Score and Plan: 3 and Ondansetron and Dexamethasone  Airway Management Planned: LMA  Additional Equipment: None  Intra-op Plan:   Post-operative Plan:   Informed Consent: I have reviewed the patients History and Physical, chart, labs and discussed the procedure including the risks, benefits and alternatives for the proposed anesthesia with the patient or authorized representative who has indicated his/her understanding and acceptance.     Dental advisory given  Plan Discussed with: CRNA, Surgeon and Anesthesiologist  Anesthesia Plan Comments:         Anesthesia Quick Evaluation

## 2019-10-28 NOTE — Progress Notes (Signed)
PT Cancellation Note  Patient Details Name: [name] [name] MRN: [id number] DOB: [birth year]   Cancelled Treatment:    Reason Eval/Treat Not Completed: Patient at procedure or test/unavailable (OR for repeat debridement).  Wyona Almas, PT, DPT Acute Rehabilitation Services Pager [phone] Office [phone]    Deno Etienne 10/28/2019, 11:02 AM

## 2019-10-28 NOTE — Progress Notes (Signed)
Dr [name] Given was contacted in regards to the patient being unhappy with her pain medication and feels like she should be given more.  New order received for Toradol prn x 5 doses.  Pharmacy was contacted due to the [facility] reporting allergic reaction to Ibuprofen- the patient has received Toradol in the past and did fine with it.

## 2019-10-28 NOTE — Progress Notes (Signed)
Ready for short stay- patient refuses to remove partial plate before leaving.  Report given to [place], RN

## 2019-10-28 NOTE — Anesthesia Procedure Notes (Signed)
Procedure Name: LMA Insertion Date/Time: 10/28/2019 12:02 PM Performed by: Rande Brunt, CRNA Pre-anesthesia Checklist: Patient identified, Emergency Drugs available, Suction available, Patient being monitored and Timeout performed Patient Re-evaluated:Patient Re-evaluated prior to induction Oxygen Delivery Method: Circle system utilized Preoxygenation: Pre-oxygenation with 100% oxygen Induction Type: IV induction LMA: LMA inserted LMA Size: 4.0 Number of attempts: 1 Placement Confirmation: positive ETCO2,  breath sounds checked- equal and bilateral and CO2 detector Tube secured with: Tape Dental Injury: Teeth and Oropharynx as per pre-operative assessment

## 2019-10-28 NOTE — Progress Notes (Signed)
Received report from the PACU nurse and was informed that the patient has been "maxed" out on the prn pain medication at this moment.  On arrival, the patient is moaning and appears very anxious and at times appears argumentative regarding her pain medication.  The patient is on her telephone but has been educated in regards to her pain medication regimen etc.

## 2019-10-28 NOTE — Op Note (Signed)
10/28/2019  12:53 PM  PATIENT:  [name] [name]    PRE-OPERATIVE DIAGNOSIS:  Left Tibia Necrotizing Fascitis  POST-OPERATIVE DIAGNOSIS:  Same  PROCEDURE:    Wound bed preparation for a wound 20 x 10 cm. Application of split-thickness skin graft 387 cm. Local tissue rearrangement for wound closure 20 x 10 cm. Application of cleanse choice wound VAC sponges x2  SURGEON:  [name], MD  PHYSICIAN ASSISTANT:None ANESTHESIA:   General  PREOPERATIVE INDICATIONS:  [name] [name] is a  39 y.o. female with a diagnosis of Left Tibia Necrotizing Fascitis who failed conservative measures and elected for surgical management.    The risks benefits and alternatives were discussed with the patient preoperatively including but not limited to the risks of infection, bleeding, nerve injury, cardiopulmonary complications, the need for revision surgery, among others, and the patient was willing to proceed.  OPERATIVE IMPLANTS: Split-thickness skin graft allograft 387 cm. Cleanse choice wound VAC sponges x2  @ENCIMAGES @  OPERATIVE FINDINGS: Good petechial bleeding in the wound bed no necrotic tissue.  No signs of infection.  All tissue was healthy bleeding and good granulation tissue  OPERATIVE PROCEDURE: Patient brought the operating room and underwent a general anesthetic.  After adequate levels anesthesia were obtained patient's left lower extremity was prepped using DuraPrep draped into a sterile field a timeout was called.  The retention sutures were removed the wound VAC sponges were removed.  Wound bed preparation was performed to prepare the wound for skin graft.  This was debrided with a rondure Cobb elevator to get the wound to bleeding healthy granulation tissue the wound is 20 x 10 cm.  The wound was then irrigated with normal saline electrocautery was used hemostasis 387 cm of skin graft was applied this was secured with staples local tissue rearrangement was then used to partially close  the skin over the skin graft the local tissue rearrangement was 20 x 10 cm.  The cleanse choice reticulated foam followed by the solid foam were applied over the wound this was covered with derma tack and Covan.  This had a good suction fit patient was extubated taken the PACU in stable condition.   DISCHARGE PLANNING:  Antibiotic duration: Continue antibiotics  Weightbearing: Weightbearing as tolerated  Pain medication: Opioid pathway  Dressing care/ Wound VAC: Incisional wound VAC with cleanse choice sponges x2.  Leave in place for 1 week after discharge  Ambulatory devices: Walker or crutches  Discharge to: Anticipate discharge to home  Follow-up: In the office 1 week post operative.

## 2019-10-28 NOTE — Progress Notes (Signed)
The patient refuses to have bladder scan done- 500 mls of urine noted per purewick

## 2019-10-28 NOTE — Interval H&P Note (Signed)
History and Physical Interval Note:  10/28/2019 6:49 AM  [name] [name]  has presented today for surgery, with the diagnosis of Left Tibia Necrotizing Fascitis.  The various methods of treatment have been discussed with the patient and family. After consideration of risks, benefits and other options for treatment, the patient has consented to  Procedure(s): REPEAT DEBRIDEMENT LEFT TIBIA (Left) as a surgical intervention.  The patient's history has been reviewed, patient examined, no change in status, stable for surgery.  I have reviewed the patient's chart and labs.  Questions were answered to the patient's satisfaction.     [name]

## 2019-10-28 NOTE — Plan of Care (Signed)

## 2019-10-28 NOTE — Progress Notes (Signed)
Lazarus Gowda, RN was asked to go and talk with the patient regarding her confusion or misunderstanding of her pain medication regimen.  She spent time at the bedside offering her other options and education.

## 2019-10-28 NOTE — Transfer of Care (Signed)
Immediate Anesthesia Transfer of Care Note  Patient: [name] [name]  Procedure(s) Performed: REPEAT DEBRIDEMENT LEFT TIBIA (Left Leg Lower)  Patient Location: PACU  Anesthesia Type:General  Level of Consciousness: awake, alert  and oriented  Airway & Oxygen Therapy: Patient Spontanous Breathing and Patient connected to face mask oxygen  Post-op Assessment: Report given to RN, Post -op Vital signs reviewed and stable and Patient moving all extremities  Post vital signs: Reviewed and stable  Last Vitals:  Vitals Value Taken Time  BP 125/101 10/28/19 1243  Temp    Pulse    Resp 24 10/28/19 1243  SpO2    Vitals shown include unvalidated device data.  Last Pain:  Vitals:   10/28/19 1040  TempSrc:   PainSc: 8       Patients Stated Pain Goal: 2 (27/63/94 3200)  Complications: No complications documented.

## 2019-10-29 DIAGNOSIS — F1910 Other psychoactive substance abuse, uncomplicated: Secondary | ICD-10-CM | POA: Diagnosis not present

## 2019-10-29 DIAGNOSIS — T148XXA Other injury of unspecified body region, initial encounter: Secondary | ICD-10-CM | POA: Diagnosis not present

## 2019-10-29 DIAGNOSIS — L089 Local infection of the skin and subcutaneous tissue, unspecified: Secondary | ICD-10-CM | POA: Diagnosis not present

## 2019-10-29 DIAGNOSIS — M726 Necrotizing fasciitis: Secondary | ICD-10-CM | POA: Diagnosis not present

## 2019-10-29 MED ORDER — SODIUM CHLORIDE 0.9% FLUSH: 10.0000 mL | INTRAVENOUS | Status: DC | PRN

## 2019-10-29 MED ORDER — SODIUM CHLORIDE 0.9% FLUSH
10.0000 mL | Freq: Two times a day (BID) | INTRAVENOUS | Status: DC
  Administered 2019-10-30 – 2019-10-31 (×2): 10 mL

## 2019-10-29 NOTE — Progress Notes (Signed)
Patient ID: [name] [name], female   DOB: [birth year], 39 y.o.   MRN: [id number] Patient is status post skin graft for necrotic fascia wound left leg.  Patient complains of increased pain there is 50 cc in the wound VAC canister there are two checks.  The discussed the difficulty with the achieving good pain relief with her long-term use of narcotics and resistance to narcotics..  I have added Toradol to her pain regimen to help with her pain.Marland Kitchen

## 2019-10-29 NOTE — Progress Notes (Signed)
Patient ID: [name] [name], female   DOB: 11/14/1980, 39 y.o.   MRN: [id number]  PROGRESS NOTE    [name] [name]  VPX:[id number] DOB: 1980/03/17 DOA: 10/20/2019 PCP: [name], MD   Brief Narrative:   39 year old female with past medical history significant for IVDU, epidural abscess in October 2020, tobacco use, depression, anxiety and GERD was admitted with large infected left lower leg wound.  She was started on IV antibiotics.  She underwent debridement of the large left lower leg wound and placement of wound VAC.  Assessment & Plan:   Large left lower leg wound infection/necrotizing fasciitis -Status post debridement and placement of wound VAC on 10/22/19.  Wound cultures growing strep pyogenes.  Initially on vancomycin and cefepime.  Switched antibiotics to Rocephin on 10/25/2019. -Status post repeat debridement on 10/26/2019 followed by further surgical intervention with split thickness skin graft on 10/28/2019.  -Continue postop pain management as per orthopedics.  Leukocytosis -Improving.  Monitor.  Normocytic anemia/anemia of chronic disease -Probably from above.  Hemoglobin stable.  Monitor  Left chest wall/breast cellulitis -Continue wound care and antibiotics plan as above  Substance abuse -Counseled to quit illicit drug use. -Continue to monitor for withdrawals.    DVT prophylaxis: SCDs Code Status:  Full code Family Communication: none at bedside Disposition Plan: Status is: Inpatient  Remains inpatient appropriate because:Inpatient level of care appropriate due to severity of illness   Dispo: The patient is from: Home              Anticipated d/c is to: Home              Anticipated d/c date is: > 3 days              Patient currently is not medically stable to d/c.   Consultants: Orthopedics  Procedures: debridement and placement of wound VAC on 10/22/19 Repeat debridement on 10/26/2019  Antimicrobials:  Anti-infectives (From admission, onward)    Start     Dose/Rate Route Frequency Ordered Stop   10/25/19 1400  cefTRIAXone (ROCEPHIN) 2 g in sodium chloride 0.9 % 100 mL IVPB        2 g 200 mL/hr over 30 Minutes Intravenous Every 24 hours 10/25/19 1127     10/24/19 1200  vancomycin (VANCOCIN) IVPB 1000 mg/200 mL premix  Status:  Discontinued        1,000 mg 200 mL/hr over 60 Minutes Intravenous Every 12 hours 10/24/19 1112 10/25/19 1127   10/22/19 1300  ceFAZolin (ANCEF) IVPB 1 g/50 mL premix  Status:  Discontinued        1 g 100 mL/hr over 30 Minutes Intravenous Every 6 hours 10/22/19 1211 10/22/19 1220   10/22/19 0600  ceFAZolin (ANCEF) IVPB 2g/100 mL premix  Status:  Discontinued        2 g 200 mL/hr over 30 Minutes Intravenous On call to O.R. 10/21/19 1840 10/22/19 0753   10/21/19 0800  vancomycin (VANCOCIN) IVPB 1000 mg/200 mL premix  Status:  Discontinued        1,000 mg 200 mL/hr over 60 Minutes Intravenous Every 8 hours 10/21/19 0432 10/24/19 1112   10/21/19 0600  ceFEPIme (MAXIPIME) 2 g in sodium chloride 0.9 % 100 mL IVPB  Status:  Discontinued        2 g 200 mL/hr over 30 Minutes Intravenous Every 8 hours 10/21/19 0406 10/25/19 1127   10/21/19 0100  aztreonam (AZACTAM) injection 2 g  Status:  Discontinued  2 g Intravenous Every 8 hours 10/21/19 0027 10/21/19 0039   10/21/19 0100  aztreonam (AZACTAM) 2 g in sodium chloride 0.9 % 100 mL IVPB  Status:  Discontinued        2 g 200 mL/hr over 30 Minutes Intravenous Every 8 hours 10/21/19 0039 10/21/19 0356   10/21/19 0030  vancomycin (VANCOCIN) IVPB 1000 mg/200 mL premix        1,000 mg 200 mL/hr over 60 Minutes Intravenous  Once 10/21/19 0027 10/21/19 7408      Subjective: Patient seen and examined at bedside.  Complains of lower extremity pain.  No overnight fever, vomiting, worsening shortness of breath reported.  Objective: Vitals:   10/28/19 1354 10/28/19 1412 10/28/19 2000 10/29/19 0300  BP: (!) 145/91 (!) 135/100 (!) 125/95 (!) 135/96  Pulse: 97 94 96 (!)  110  Resp: 15 16 18 18   Temp: 98.1 F (36.7 C) 98.1 F (36.7 C) 98.2 F (36.8 C) 98.3 F (36.8 C)  TempSrc:  Oral Oral Oral  SpO2: 97% 100% 100% 100%  Weight:      Height:        Intake/Output Summary (Last 24 hours) at 10/29/2019 0814 Last data filed at 10/29/2019 0500 Gross per 24 hour  Intake 2365.33 ml  Output 2375 ml  Net -9.67 ml   Filed Weights   10/25/19 0429 10/26/19 0059 10/27/19 0439  Weight: 96.6 kg 94.4 kg 92.4 kg    Examination:  General exam: No distress.  Poor historian.  Chronically ill looking. Respiratory system: Bilateral decreased breath sounds at bases with scattered crackles  cardiovascular system: S1-S2 heard, tachycardic Gastrointestinal system: Abdomen is obese, nondistended, soft and nontender.  Normal bowel sounds are heard  extremities: No clubbing.  Left lower extremity dressing with wound VAC present.  Data Reviewed: I have personally reviewed following labs and imaging studies  CBC: Recent Labs  Lab 10/22/19 1320 10/23/19 0640 10/24/19 0353 10/27/19 0552  WBC 10.9* 30.1* 23.0* 14.2*  NEUTROABS  --   --   --  8.7*  HGB 9.5* 9.8* 10.5* 8.3*  HCT 29.2* 29.5* 32.7* 26.4*  MCV 86.9 86.8 88.4 89.8  PLT 340 304 335 144   Basic Metabolic Panel: Recent Labs  Lab 10/22/19 1320 10/23/19 0640 10/24/19 0353 10/27/19 0552  NA  --  134* 135 136  K  --  3.2* 3.8 3.5  CL  --  101 103 103  CO2  --  23 23 24   GLUCOSE  --  82 79 159*  BUN  --  6 7 8   CREATININE 0.57 0.61 0.60 0.69  CALCIUM  --  8.3* 8.7* 8.4*  MG  --   --  2.0 1.9  PHOS  --   --  2.4*  --    GFR: Estimated Creatinine Clearance: 97.9 mL/min (by C-G formula based on SCr of 0.69 mg/dL). Liver Function Tests: Recent Labs  Lab 10/24/19 0353  ALBUMIN 2.2*   No results for input(s): LIPASE, AMYLASE in the last 168 hours. No results for input(s): AMMONIA in the last 168 hours. Coagulation Profile: No results for input(s): INR, PROTIME in the last 168 hours. Cardiac  Enzymes: No results for input(s): CKTOTAL, CKMB, CKMBINDEX, TROPONINI in the last 168 hours. BNP (last 3 results) No results for input(s): PROBNP in the last 8760 hours. HbA1C: No results for input(s): HGBA1C in the last 72 hours. CBG: No results for input(s): GLUCAP in the last 168 hours. Lipid Profile: No results for input(s): CHOL,  HDL, LDLCALC, TRIG, CHOLHDL, LDLDIRECT in the last 72 hours. Thyroid Function Tests: No results for input(s): TSH, T4TOTAL, FREET4, T3FREE, THYROIDAB in the last 72 hours. Anemia Panel: No results for input(s): VITAMINB12, FOLATE, FERRITIN, TIBC, IRON, RETICCTPCT in the last 72 hours. Sepsis Labs: No results for input(s): PROCALCITON, LATICACIDVEN in the last 168 hours.  Recent Results (from the past 240 hour(s))  Wound or Superficial Culture     Status: None   Collection Time: 10/21/19 12:21 AM   Specimen: Wound  Result Value Ref Range Status   Specimen Description WOUND LEFT LEG  Final   Special Requests NONE  Final   Gram Stain   Final    ABUNDANT WBC PRESENT, PREDOMINANTLY PMN ABUNDANT GRAM POSITIVE COCCI IN PAIRS IN CHAINS RARE GRAM NEGATIVE RODS    Culture   Final    MODERATE GROUP A STREP (S.PYOGENES) ISOLATED Beta hemolytic streptococci are predictably susceptible to penicillin and other beta lactams. Susceptibility testing not routinely performed. Performed at [facility], [place] [address] [place], [place] [address]    Report Status 10/23/2019 FINAL  Final  Respiratory Panel by RT PCR (Flu A&B, Covid) - Nasopharyngeal Swab     Status: None   Collection Time: 10/21/19 12:40 AM   Specimen: Nasopharyngeal Swab  Result Value Ref Range Status   SARS Coronavirus 2 by RT PCR NEGATIVE NEGATIVE Final    Comment: (NOTE) SARS-CoV-2 target nucleic acids are NOT DETECTED.  The SARS-CoV-2 RNA is generally detectable in upper respiratoy specimens during the acute phase of infection. The lowest concentration of SARS-CoV-2 viral copies this  assay can detect is 131 copies/mL. A negative result does not preclude SARS-Cov-2 infection and should not be used as the sole basis for treatment or other patient management decisions. A negative result may occur with  improper specimen collection/handling, submission of specimen other than nasopharyngeal swab, presence of viral mutation(s) within the areas targeted by this assay, and inadequate number of viral copies (<131 copies/mL). A negative result must be combined with clinical observations, patient history, and epidemiological information. The expected result is Negative.  Fact Sheet for Patients:  PinkCheek.be  Fact Sheet for Healthcare Providers:  GravelBags.it  This test is no t yet approved or cleared by the [place] FDA and  has been authorized for detection and/or diagnosis of SARS-CoV-2 by FDA under an Emergency Use Authorization (EUA). This EUA will remain  in effect (meaning this test can be used) for the duration of the COVID-19 declaration under Section 564(b)(1) of the Act, 21 U.S.C. section 360bbb-3(b)(1), unless the authorization is terminated or revoked sooner.     Influenza A by PCR NEGATIVE NEGATIVE Final   Influenza B by PCR NEGATIVE NEGATIVE Final    Comment: (NOTE) The Xpert Xpress SARS-CoV-2/FLU/RSV assay is intended as an aid in  the diagnosis of influenza from Nasopharyngeal swab specimens and  should not be used as a sole basis for treatment. Nasal washings and  aspirates are unacceptable for Xpert Xpress SARS-CoV-2/FLU/RSV  testing.  Fact Sheet for Patients: PinkCheek.be  Fact Sheet for Healthcare Providers: GravelBags.it  This test is not yet approved or cleared by the [place] FDA and  has been authorized for detection and/or diagnosis of SARS-CoV-2 by  FDA under an Emergency Use Authorization (EUA). This EUA will  remain  in effect (meaning this test can be used) for the duration of the  Covid-19 declaration under Section 564(b)(1) of the Act, 21  U.S.C. section 360bbb-3(b)(1), unless the  authorization is  terminated or revoked. Performed at [facility], [place] [address], [place], [place] [address]   Blood Culture (routine x 2)     Status: None   Collection Time: 10/21/19  1:23 AM   Specimen: BLOOD  Result Value Ref Range Status   Specimen Description BLOOD MIDLINE  Final   Special Requests   Final    BOTTLES DRAWN AEROBIC AND ANAEROBIC Blood Culture adequate volume   Culture   Final    NO GROWTH 5 DAYS Performed at [facility], [place] [address] [place], [place] [address]    Report Status 10/26/2019 FINAL  Final  Blood Culture (routine x 2)     Status: None   Collection Time: 10/21/19  1:23 AM   Specimen: BLOOD  Result Value Ref Range Status   Specimen Description BLOOD MIDLINE  Final   Special Requests   Final    BOTTLES DRAWN AEROBIC AND ANAEROBIC Blood Culture adequate volume   Culture   Final    NO GROWTH 5 DAYS Performed at [facility], [place] [address] [place], [place] [address]    Report Status 10/26/2019 FINAL  Final  Urine culture     Status: Abnormal   Collection Time: 10/21/19  5:29 AM   Specimen: Urine, Random  Result Value Ref Range Status   Specimen Description URINE, RANDOM  Final   Special Requests   Final    NONE Performed at [facility], [place] [address] [place], [place] [address]    Culture MULTIPLE SPECIES PRESENT, SUGGEST RECOLLECTION (A)  Final   Report Status 10/21/2019 FINAL  Final  Surgical pcr screen     Status: None   Collection Time: 10/21/19  6:48 PM   Specimen: Nasal Mucosa; Nasal Swab  Result Value Ref Range Status   MRSA, PCR NEGATIVE NEGATIVE Final   Staphylococcus aureus NEGATIVE NEGATIVE Final    Comment: (NOTE) The Xpert SA Assay (FDA approved for NASAL specimens in patients 69 years of age and older), is one  component of a comprehensive surveillance program. It is not intended to diagnose infection nor to guide or monitor treatment. Performed at [facility], [place] [address], [place], [place] [address]   Aerobic/Anaerobic Culture (surgical/deep wound)     Status: None   Collection Time: 10/22/19  8:37 AM   Specimen: Abscess  Result Value Ref Range Status   Specimen Description ABSCESS  Final   Special Requests LEFT SHIN SPEC A  Final   Gram Stain   Final    MODERATE WBC PRESENT,BOTH PMN AND MONONUCLEAR ABUNDANT GRAM POSITIVE COCCI IN PAIRS IN CHAINS IN CLUSTERS    Culture   Final    ABUNDANT GROUP A STREP (S.PYOGENES) ISOLATED NO ANAEROBES ISOLATED Performed at [facility], [place] [address], [place], [place] [address]    Report Status 10/27/2019 FINAL  Final  Aerobic/Anaerobic Culture (surgical/deep wound)     Status: None (Preliminary result)   Collection Time: 10/26/19  8:28 AM   Specimen: Soft Tissue, Other  Result Value Ref Range Status   Specimen Description TISSUE  Final   Special Requests LEFT TIBIA SPEC A  Final   Gram Stain   Final    ABUNDANT WBC PRESENT,BOTH PMN AND MONONUCLEAR RARE GRAM POSITIVE COCCI    Culture   Final    NO GROWTH 2 DAYS NO ANAEROBES ISOLATED; CULTURE IN PROGRESS FOR 5 DAYS Performed at [facility], [address] [place], [place] [address]    Report Status PENDING  Incomplete         Radiology Studies: No results found.      Scheduled Meds:  [START ON 10/30/2019] celecoxib  200 mg Oral BID   [START ON 10/30/2019] enoxaparin (LOVENOX) injection  40 mg Subcutaneous Q24H   feeding supplement (ENSURE ENLIVE)  237 mL Oral TID BM   gabapentin  300 mg Oral TID   influenza vac split quadrivalent PF  0.5 mL Intramuscular Tomorrow-1000   ketorolac  15 mg Intravenous Q6H   multivitamin with minerals  1 tablet Oral Daily   senna-docusate  1 tablet Oral BID   sodium chloride flush  10-40 mL Intracatheter Q12H    Continuous Infusions:  sodium chloride Stopped (10/25/19 2006)   sodium chloride 75 mL/hr at 10/27/19 1756   sodium chloride 75 mL/hr at 10/28/19 1502   cefTRIAXone (ROCEPHIN)  IV 2 g (10/28/19 1506)   lactated ringers 300 mL/hr at 10/28/19 1229   methocarbamol (ROBAXIN) IV            Aline August, MD [facility] 10/29/2019, 8:14 AM

## 2019-10-29 NOTE — Progress Notes (Signed)
10/09 Pt refused labs this AM. She stated reason as pain, pain meds administered and she stated that she is willing to do it later when she settles down. Pt educated on the importance of drawing labs. She verbalized understanding.

## 2019-10-29 NOTE — Progress Notes (Signed)
OT Cancellation Note  Patient Details Name: [name] [name] MRN: [id number] DOB: October 26, 1980   Cancelled Treatment:    Reason Eval/Treat Not Completed: Patient declined, no reason specified;Pain limiting ability to participate. Waited for administration of dilaudid prior to OT session attempts. Despite premedication, pt refused all activities reporting too much pain. Educated on benefits of attempting activities, OOB activities to combat deconditioning, increasing independence. Pt reports MD told her to avoid attempts to walk to bathroom. OT educated on WBAT status. Pt reports "I havent forgotten how to do things (ADLs) since I've been here" and reports transferring to [facility] with staff prior to surgery. If pt refuses OT session again, plan to sign off.   Layla Maw 10/29/2019, 11:04 AM

## 2019-10-29 NOTE — Progress Notes (Signed)
PT Cancellation Note  Patient Details Name: Olanda Boughner MRN: [id number] DOB: 07-02-1980   Cancelled Treatment:    Reason Eval/Treat Not Completed: Other (comment) Pt adamantly refusing therapy; stating she wants to wait for her husband, eat lunch and do a shampoo cap. States she is agreeable for tomorrow.  Wyona Almas, PT, DPT Acute Rehabilitation Services Pager [phone] Office [phone]    Deno Etienne 10/29/2019, 1:39 PM

## 2019-10-30 DIAGNOSIS — F1910 Other psychoactive substance abuse, uncomplicated: Secondary | ICD-10-CM | POA: Diagnosis not present

## 2019-10-30 DIAGNOSIS — L089 Local infection of the skin and subcutaneous tissue, unspecified: Secondary | ICD-10-CM | POA: Diagnosis not present

## 2019-10-30 DIAGNOSIS — T148XXA Other injury of unspecified body region, initial encounter: Secondary | ICD-10-CM | POA: Diagnosis not present

## 2019-10-30 DIAGNOSIS — M726 Necrotizing fasciitis: Secondary | ICD-10-CM | POA: Diagnosis not present

## 2019-10-30 LAB — BASIC METABOLIC PANEL
Anion gap: 10 (ref 5–15)
BUN: 9 mg/dL (ref 6–20)
CO2: 23 mmol/L (ref 22–32)
Calcium: 8.6 mg/dL — ABNORMAL LOW (ref 8.9–10.3)
Chloride: 100 mmol/L (ref 98–111)
Creatinine, Ser: 0.61 mg/dL (ref 0.44–1.00)
GFR, Estimated: >60 mL/min (ref >60)
Glucose, Bld: 103 mg/dL — ABNORMAL HIGH (ref 70–99)
Potassium: 4.3 mmol/L (ref 3.5–5.1)
Sodium: 133 mmol/L — ABNORMAL LOW (ref 135–145)

## 2019-10-30 LAB — CBC WITH DIFFERENTIAL/PLATELET
Abs Immature Granulocytes: 0.35 10*3/uL — ABNORMAL HIGH (ref 0.00–0.07)
Basophils Absolute: 0 10*3/uL (ref 0.0–0.1)
Basophils Relative: 0 %
Eosinophils Absolute: 0.2 10*3/uL (ref 0.0–0.5)
Eosinophils Relative: 2 %
HCT: 26.5 % — ABNORMAL LOW (ref 36.0–46.0)
Hemoglobin: 8.2 g/dL — ABNORMAL LOW (ref 12.0–15.0)
Immature Granulocytes: 4 %
Lymphocytes Relative: 21 %
Lymphs Abs: 1.7 10*3/uL (ref 0.7–4.0)
MCH: 28.1 pg (ref 26.0–34.0)
MCHC: 30.9 g/dL (ref 30.0–36.0)
MCV: 90.8 fL (ref 80.0–100.0)
Monocytes Absolute: 0.5 10*3/uL (ref 0.1–1.0)
Monocytes Relative: 6 %
Neutro Abs: 5.7 10*3/uL (ref 1.7–7.7)
Neutrophils Relative %: 67 %
Platelets: 341 10*3/uL (ref 150–400)
RBC: 2.92 MIL/uL — ABNORMAL LOW (ref 3.87–5.11)
RDW: 16 % — ABNORMAL HIGH (ref 11.5–15.5)
WBC: 8.4 10*3/uL (ref 4.0–10.5)
nRBC: 0 % (ref 0.0–0.2)

## 2019-10-30 LAB — HEMOGLOBIN A1C
Hgb A1c MFr Bld: 5.6 % (ref 4.8–5.6)
Mean Plasma Glucose: 114.02 mg/dL

## 2019-10-30 LAB — CREATININE, SERUM
Creatinine, Ser: 0.59 mg/dL (ref 0.44–1.00)
GFR, Estimated: >60 mL/min (ref >60)

## 2019-10-30 LAB — C-REACTIVE PROTEIN: CRP: 3.1 mg/dL — ABNORMAL HIGH (ref <1.0)

## 2019-10-30 NOTE — Progress Notes (Signed)
Occupational Therapy Treatment Patient Details Name: [name] [name] MRN: [id number] DOB: 11/06/1980 Today's Date: 10/30/2019    History of present illness Patient is a 39 y/o female who presents with LLE wound now s/p I&D with wound vac application 25/9. + heroin and xanax on admission. PMh includes cocaine abuse, migraine, anxiety.   OT comments  OT treatment session with focus on bed mobility, functional transfers, and safety with DME. Patient reporting 7-8/10 pain at rest. Per RN, patient not due for meds at time of treatment session. Patient able to don R sock but declined donning L sock 2/2 pain. Patient completed bed mobility with supervision A and sit to stand from EOB to RW with Min guard. Patient continues to be NWB in LLE by choice 2/2 pain in LLE despite education on WBAT. Short-distance ambulation ~24ft before patient requested to return to supine 2/2 pain. Patient would benefit from continued acute OT services in prep for safe d/c home with family. No follow-up OT needs at this time.      Follow Up Recommendations  No OT follow up    Equipment Recommendations  None recommended by OT    Recommendations for Other Services      Precautions / Restrictions Precautions Precautions: Fall;Other (comment) Precaution Comments: wound vac Restrictions Weight Bearing Restrictions: No LLE Weight Bearing: Weight bearing as tolerated       Mobility Bed Mobility Overal bed mobility: Needs Assistance Bed Mobility: Supine to Sit;Sit to Supine     Supine to sit: HOB elevated;Supervision Sit to supine: HOB elevated;Supervision   General bed mobility comments: Supervision A for supine <> EOB with HOB elevated and increased time/effort.   Transfers Overall transfer level: Needs assistance Equipment used: Rolling walker (2 wheeled) Transfers: Sit to/from [facility] Sit to Stand: Min guard Stand pivot transfers: Min guard       General transfer comment: Patient  NWB by choice despite education on WBAT in LLE.     Balance Overall balance assessment: Needs assistance   Sitting balance-Leahy Scale: Good       Standing balance-Leahy Scale: Poor Standing balance comment: requries UE support for standing/transfers due to unwillingness to place weight through LLE                           ADL either performed or assessed with clinical judgement   ADL       Grooming: Sitting;Set up               Lower Body Dressing: Moderate assistance;Sit to/from stand Lower Body Dressing Details (indicate cue type and reason): Patient able to don R sock seated EOB. Refused donning L sock 2/2 pain.             Functional mobility during ADLs: Min guard;Rolling walker General ADL Comments: Patient stood from EOB to RW with supervision A. Patient walked ~82ft before requesting to return to bed 2/2 pain in LLE.      Vision       Perception     Praxis      Cognition Arousal/Alertness: Awake/alert Behavior During Therapy: Anxious Overall Cognitive Status: Within Functional Limits for tasks assessed                                          Exercises     Shoulder Instructions  General Comments      Pertinent Vitals/ Pain       Pain Assessment: 0-10 Pain Score: 9  Pain Location: LLE Pain Descriptors / Indicators: Discomfort;Grimacing;Guarding;Throbbing Pain Intervention(s): Premedicated before session  Home Living                                          Prior Functioning/Environment              Frequency  Min 2X/week        Progress Toward Goals  OT Goals(current goals can now be found in the care plan section)  Progress towards OT goals: Progressing toward goals  Acute Rehab OT Goals Patient Stated Goal: to get pain under control OT Goal Formulation: With patient Time For Goal Achievement: 11/07/19 Potential to Achieve Goals: Good ADL Goals Pt Will Perform  Grooming: with modified independence;standing Pt Will Perform Lower Body Bathing: with modified independence Pt Will Perform Lower Body Dressing: with modified independence;sit to/from stand Pt Will Transfer to Toilet: with modified independence;ambulating;regular height toilet Pt Will Perform Toileting - Clothing Manipulation and hygiene: with modified independence;sit to/from stand Additional ADL Goal #1: Pt will gather items necessary for ADL around room with RW modified independently.  Plan Discharge plan remains appropriate    Co-evaluation                 AM-PAC OT "6 Clicks" Daily Activity     Outcome Measure   Help from another person eating meals?: None Help from another person taking care of personal grooming?: A Little Help from another person toileting, which includes using toliet, bedpan, or urinal?: A Little Help from another person bathing (including washing, rinsing, drying)?: A Lot Help from another person to put on and taking off regular upper body clothing?: A Little Help from another person to put on and taking off regular lower body clothing?: A Lot 6 Click Score: 17    End of Session Equipment Utilized During Treatment: Gait belt  OT Visit Diagnosis: Unsteadiness on feet (R26.81);Pain;Muscle weakness (generalized) (M62.81)   Activity Tolerance Patient limited by pain   Patient Left in bed;with call bell/phone within reach;with bed alarm set   Nurse Communication          Time: 7793-9030 OT Time Calculation (min): 19 min  Charges: OT General Charges $OT Visit: 1 Visit OT Treatments $Self Care/Home Management : 8-22 mins  Loree Shehata H. OTR/L Supplemental OT, Department of rehab services [phone]   Tamiya Colello R H. 10/30/2019, 10:10 AM

## 2019-10-30 NOTE — Progress Notes (Signed)
Patient ID: [name] [name], female   DOB: May 07, 1980, 39 y.o.   MRN: [id number] The patient does report better pain control today.  The VAC on the left lower extremity of the skin graft has a good seal.  There is some drainage in the canister but not a lot.  The foot is well-perfused.  The patient does report some better pain control and appears comfortable.

## 2019-10-30 NOTE — Plan of Care (Signed)

## 2019-10-30 NOTE — Progress Notes (Signed)
Patient ID: [name] [name], female   DOB: August 24, 1980, 39 y.o.   MRN: [id number]  PROGRESS NOTE    [name] [name]  XIP:[id number] DOB: 09-14-1980 DOA: 10/20/2019 PCP: [name], MD   Brief Narrative:   39 year old female with past medical history significant for IVDU, epidural abscess in October 2020, tobacco use, depression, anxiety and GERD was admitted with large infected left lower leg wound.  She was started on IV antibiotics.  She underwent debridement of the large left lower leg wound and placement of wound VAC.  Assessment & Plan:   Large left lower leg wound infection/necrotizing fasciitis -Status post debridement and placement of wound VAC on 10/22/19.  Wound cultures growing strep pyogenes.  Initially on vancomycin and cefepime.  Switched antibiotics to Rocephin on 10/25/2019. -Status post repeat debridement on 10/26/2019 followed by further surgical intervention with split thickness skin graft on 10/28/2019.  -Continue postop pain management as per orthopedics.  Antibiotic duration to be decided by orthopedics team. -CRP 3.1 today. -Follow PT OT recommendations  Leukocytosis -Resolved.  Normocytic anemia/anemia of chronic disease -Probably from above.  Hemoglobin stable.  Monitor  Left chest wall/breast cellulitis -Continue wound care and antibiotics plan as above  Substance abuse -Counseled to quit illicit drug use. -Continue to monitor for withdrawals.    DVT prophylaxis: SCDs Code Status:  Full code Family Communication: none at bedside Disposition Plan: Status is: Inpatient  Remains inpatient appropriate because:Inpatient level of care appropriate due to severity of illness   Dispo: The patient is from: Home              Anticipated d/c is to: Home              Anticipated d/c date is: 2 days              Patient currently is not medically stable to d/c.   Consultants: Orthopedics  Procedures: debridement and placement of wound VAC on 10/22/19 Repeat  debridement on 10/26/2019  Antimicrobials:  Anti-infectives (From admission, onward)   Start     Dose/Rate Route Frequency Ordered Stop   10/25/19 1400  cefTRIAXone (ROCEPHIN) 2 g in sodium chloride 0.9 % 100 mL IVPB        2 g 200 mL/hr over 30 Minutes Intravenous Every 24 hours 10/25/19 1127     10/24/19 1200  vancomycin (VANCOCIN) IVPB 1000 mg/200 mL premix  Status:  Discontinued        1,000 mg 200 mL/hr over 60 Minutes Intravenous Every 12 hours 10/24/19 1112 10/25/19 1127   10/22/19 1300  ceFAZolin (ANCEF) IVPB 1 g/50 mL premix  Status:  Discontinued        1 g 100 mL/hr over 30 Minutes Intravenous Every 6 hours 10/22/19 1211 10/22/19 1220   10/22/19 0600  ceFAZolin (ANCEF) IVPB 2g/100 mL premix  Status:  Discontinued        2 g 200 mL/hr over 30 Minutes Intravenous On call to O.R. 10/21/19 1840 10/22/19 0753   10/21/19 0800  vancomycin (VANCOCIN) IVPB 1000 mg/200 mL premix  Status:  Discontinued        1,000 mg 200 mL/hr over 60 Minutes Intravenous Every 8 hours 10/21/19 0432 10/24/19 1112   10/21/19 0600  ceFEPIme (MAXIPIME) 2 g in sodium chloride 0.9 % 100 mL IVPB  Status:  Discontinued        2 g 200 mL/hr over 30 Minutes Intravenous Every 8 hours 10/21/19 0406 10/25/19 1127   10/21/19 0100  aztreonam (AZACTAM)  injection 2 g  Status:  Discontinued        2 g Intravenous Every 8 hours 10/21/19 0027 10/21/19 0039   10/21/19 0100  aztreonam (AZACTAM) 2 g in sodium chloride 0.9 % 100 mL IVPB  Status:  Discontinued        2 g 200 mL/hr over 30 Minutes Intravenous Every 8 hours 10/21/19 0039 10/21/19 0356   10/21/19 0030  vancomycin (VANCOCIN) IVPB 1000 mg/200 mL premix        1,000 mg 200 mL/hr over 60 Minutes Intravenous  Once 10/21/19 0027 10/21/19 6063      Subjective: Patient seen and examined at bedside.  No worsening shortness of breath, vomiting, fevers reported.  Complains of lower extremity pain.   Objective: Vitals:   10/29/19 1450 10/29/19 1937 10/30/19 0457  10/30/19 0729  BP: 131/80 122/80 117/72 116/75  Pulse: (!) 101 (!) 110 89 90  Resp: 17 16  17   Temp: 99.1 F (37.3 C) 98.8 F (37.1 C) 99.8 F (37.7 C) 98.2 F (36.8 C)  TempSrc: Oral Oral Oral Oral  SpO2: 91% 97% 99% 98%  Weight:      Height:       No intake or output data in the 24 hours ending 10/30/19 0756 Filed Weights   10/25/19 0429 10/26/19 0059 10/27/19 0439  Weight: 96.6 kg 94.4 kg 92.4 kg    Examination:  General exam: No acute distress.  Poor historian.  Looks chronically ill. Respiratory system: Bilateral decreased breath sounds at bases with no wheezing  cardiovascular system: Currently rate controlled, S1-S2 heard  gastrointestinal system: Abdomen is obese, nondistended, soft and nontender.  Bowel sounds are heard  extremities: Left lower extremity has dressing with wound VAC.  No cyanosis.  Data Reviewed: I have personally reviewed following labs and imaging studies  CBC: Recent Labs  Lab 10/24/19 0353 10/27/19 0552 10/30/19 0348  WBC 23.0* 14.2* 8.4  NEUTROABS  --  8.7* 5.7  HGB 10.5* 8.3* 8.2*  HCT 32.7* 26.4* 26.5*  MCV 88.4 89.8 90.8  PLT 335 327 016   Basic Metabolic Panel: Recent Labs  Lab 10/24/19 0353 10/27/19 0552 10/29/19 1000 10/30/19 0348  NA 135 136  --  133*  K 3.8 3.5  --  4.3  CL 103 103  --  100  CO2 23 24  --  23  GLUCOSE 79 159*  --  103*  BUN 7 8  --  9  CREATININE 0.60 0.69 0.59 0.61  CALCIUM 8.7* 8.4*  --  8.6*  MG 2.0 1.9  --   --   PHOS 2.4*  --   --   --    GFR: Estimated Creatinine Clearance: 97.9 mL/min (by C-G formula based on SCr of 0.61 mg/dL). Liver Function Tests: Recent Labs  Lab 10/24/19 0353  ALBUMIN 2.2*   No results for input(s): LIPASE, AMYLASE in the last 168 hours. No results for input(s): AMMONIA in the last 168 hours. Coagulation Profile: No results for input(s): INR, PROTIME in the last 168 hours. Cardiac Enzymes: No results for input(s): CKTOTAL, CKMB, CKMBINDEX, TROPONINI in the last  168 hours. BNP (last 3 results) No results for input(s): PROBNP in the last 8760 hours. HbA1C: Recent Labs    10/30/19 0348  HGBA1C 5.6   CBG: No results for input(s): GLUCAP in the last 168 hours. Lipid Profile: No results for input(s): CHOL, HDL, LDLCALC, TRIG, CHOLHDL, LDLDIRECT in the last 72 hours. Thyroid Function Tests: No results for input(s):  TSH, T4TOTAL, FREET4, T3FREE, THYROIDAB in the last 72 hours. Anemia Panel: No results for input(s): VITAMINB12, FOLATE, FERRITIN, TIBC, IRON, RETICCTPCT in the last 72 hours. Sepsis Labs: No results for input(s): PROCALCITON, LATICACIDVEN in the last 168 hours.  Recent Results (from the past 240 hour(s))  Wound or Superficial Culture     Status: None   Collection Time: 10/21/19 12:21 AM   Specimen: Wound  Result Value Ref Range Status   Specimen Description WOUND LEFT LEG  Final   Special Requests NONE  Final   Gram Stain   Final    ABUNDANT WBC PRESENT, PREDOMINANTLY PMN ABUNDANT GRAM POSITIVE COCCI IN PAIRS IN CHAINS RARE GRAM NEGATIVE RODS    Culture   Final    MODERATE GROUP A STREP (S.PYOGENES) ISOLATED Beta hemolytic streptococci are predictably susceptible to penicillin and other beta lactams. Susceptibility testing not routinely performed. Performed at [facility], [place] [address] [place], [place] [address]    Report Status 10/23/2019 FINAL  Final  Respiratory Panel by RT PCR (Flu A&B, Covid) - Nasopharyngeal Swab     Status: None   Collection Time: 10/21/19 12:40 AM   Specimen: Nasopharyngeal Swab  Result Value Ref Range Status   SARS Coronavirus 2 by RT PCR NEGATIVE NEGATIVE Final    Comment: (NOTE) SARS-CoV-2 target nucleic acids are NOT DETECTED.  The SARS-CoV-2 RNA is generally detectable in upper respiratoy specimens during the acute phase of infection. The lowest concentration of SARS-CoV-2 viral copies this assay can detect is 131 copies/mL. A negative result does not preclude  SARS-Cov-2 infection and should not be used as the sole basis for treatment or other patient management decisions. A negative result may occur with  improper specimen collection/handling, submission of specimen other than nasopharyngeal swab, presence of viral mutation(s) within the areas targeted by this assay, and inadequate number of viral copies (<131 copies/mL). A negative result must be combined with clinical observations, patient history, and epidemiological information. The expected result is Negative.  Fact Sheet for Patients:  PinkCheek.be  Fact Sheet for Healthcare Providers:  GravelBags.it  This test is no t yet approved or cleared by the [place] FDA and  has been authorized for detection and/or diagnosis of SARS-CoV-2 by FDA under an Emergency Use Authorization (EUA). This EUA will remain  in effect (meaning this test can be used) for the duration of the COVID-19 declaration under Section 564(b)(1) of the Act, 21 U.S.C. section 360bbb-3(b)(1), unless the authorization is terminated or revoked sooner.     Influenza A by PCR NEGATIVE NEGATIVE Final   Influenza B by PCR NEGATIVE NEGATIVE Final    Comment: (NOTE) The Xpert Xpress SARS-CoV-2/FLU/RSV assay is intended as an aid in  the diagnosis of influenza from Nasopharyngeal swab specimens and  should not be used as a sole basis for treatment. Nasal washings and  aspirates are unacceptable for Xpert Xpress SARS-CoV-2/FLU/RSV  testing.  Fact Sheet for Patients: PinkCheek.be  Fact Sheet for Healthcare Providers: GravelBags.it  This test is not yet approved or cleared by the [place] FDA and  has been authorized for detection and/or diagnosis of SARS-CoV-2 by  FDA under an Emergency Use Authorization (EUA). This EUA will remain  in effect (meaning this test can be used) for the duration of the   Covid-19 declaration under Section 564(b)(1) of the Act, 21  U.S.C. section 360bbb-3(b)(1), unless the authorization is  terminated or revoked. Performed at [facility], [place],  [place] [address]   Blood Culture (routine x 2)     Status: None   Collection Time: 10/21/19  1:23 AM   Specimen: BLOOD  Result Value Ref Range Status   Specimen Description BLOOD MIDLINE  Final   Special Requests   Final    BOTTLES DRAWN AEROBIC AND ANAEROBIC Blood Culture adequate volume   Culture   Final    NO GROWTH 5 DAYS Performed at [facility], [address] [place], [place] [address]    Report Status 10/26/2019 FINAL  Final  Blood Culture (routine x 2)     Status: None   Collection Time: 10/21/19  1:23 AM   Specimen: BLOOD  Result Value Ref Range Status   Specimen Description BLOOD MIDLINE  Final   Special Requests   Final    BOTTLES DRAWN AEROBIC AND ANAEROBIC Blood Culture adequate volume   Culture   Final    NO GROWTH 5 DAYS Performed at [facility], [place] [address] [place], [place] [address]    Report Status 10/26/2019 FINAL  Final  Urine culture     Status: Abnormal   Collection Time: 10/21/19  5:29 AM   Specimen: Urine, Random  Result Value Ref Range Status   Specimen Description URINE, RANDOM  Final   Special Requests   Final    NONE Performed at [facility], [place] [address] [place], [place] [address]    Culture MULTIPLE SPECIES PRESENT, SUGGEST RECOLLECTION (A)  Final   Report Status 10/21/2019 FINAL  Final  Surgical pcr screen     Status: None   Collection Time: 10/21/19  6:48 PM   Specimen: Nasal Mucosa; Nasal Swab  Result Value Ref Range Status   MRSA, PCR NEGATIVE NEGATIVE Final   Staphylococcus aureus NEGATIVE NEGATIVE Final    Comment: (NOTE) The Xpert SA Assay (FDA approved for NASAL specimens in patients 23 years of age and older), is one component of a comprehensive surveillance program. It is not intended to diagnose  infection nor to guide or monitor treatment. Performed at [facility], [place] [address] [place], [place] [address]   Aerobic/Anaerobic Culture (surgical/deep wound)     Status: None   Collection Time: 10/22/19  8:37 AM   Specimen: Abscess  Result Value Ref Range Status   Specimen Description ABSCESS  Final   Special Requests LEFT SHIN SPEC A  Final   Gram Stain   Final    MODERATE WBC PRESENT,BOTH PMN AND MONONUCLEAR ABUNDANT GRAM POSITIVE COCCI IN PAIRS IN CHAINS IN CLUSTERS    Culture   Final    ABUNDANT GROUP A STREP (S.PYOGENES) ISOLATED NO ANAEROBES ISOLATED Performed at [facility], [place] [address] [place], [place] [address]    Report Status 10/27/2019 FINAL  Final  Aerobic/Anaerobic Culture (surgical/deep wound)     Status: None (Preliminary result)   Collection Time: 10/26/19  8:28 AM   Specimen: Soft Tissue, Other  Result Value Ref Range Status   Specimen Description TISSUE  Final   Special Requests LEFT TIBIA SPEC A  Final   Gram Stain   Final    ABUNDANT WBC PRESENT,BOTH PMN AND MONONUCLEAR RARE GRAM POSITIVE COCCI    Culture   Final    NO GROWTH 3 DAYS NO ANAEROBES ISOLATED; CULTURE IN PROGRESS FOR 5 DAYS Performed at [facility], [place] [address], [place], [place] [address]    Report Status PENDING  Incomplete  Radiology Studies: No results found.      Scheduled Meds: . celecoxib  200 mg Oral BID  . enoxaparin (LOVENOX) injection  40 mg Subcutaneous Q24H  . feeding supplement (ENSURE ENLIVE)  237 mL Oral TID BM  . gabapentin  300 mg Oral TID  . influenza vac split quadrivalent PF  0.5 mL Intramuscular Tomorrow-1000  . multivitamin with minerals  1 tablet Oral Daily  . senna-docusate  1 tablet Oral BID  . sodium chloride flush  10-40 mL Intracatheter Q12H  . sodium chloride flush  10-40 mL Intracatheter Q12H   Continuous Infusions: . sodium chloride Stopped (10/25/19 2006)  . sodium chloride 75 mL/hr at 10/27/19 1756   . sodium chloride 75 mL/hr at 10/28/19 1502  . cefTRIAXone (ROCEPHIN)  IV 2 g (10/29/19 1319)  . lactated ringers 300 mL/hr at 10/28/19 1229  . methocarbamol (ROBAXIN) IV            Aline August, MD [facility] 10/30/2019, 7:56 AM

## 2019-10-30 NOTE — Progress Notes (Signed)
Patient helped to [facility] by PT and myself. I noticed her linens were soiled and changed them. Found rectangle blue pill with 4 squares with B707 on the front and lines on back. Pharmacy identified this as 2mg  Xanax and that is not one of the patients Medications.  Wasted pill with charge nurse Merrie Roof. Made patient aware that she can not take her own medications. She stated her husband dropped it in her bed last night.

## 2019-10-30 NOTE — Plan of Care (Signed)
  Problem: Education: Goal: Knowledge of General Education information will improve Description: Including pain rating scale, medication(s)/side effects and non-pharmacologic comfort measures Outcome: Progressing   Problem: Health Behavior/Discharge Planning: Goal: Ability to manage health-related needs will improve Outcome: Progressing   Problem: Coping: Goal: Level of anxiety will decrease Outcome: Progressing   Problem: Pain Managment: Goal: General experience of comfort will improve Outcome: Progressing   Problem: Skin Integrity: Goal: Risk for impaired skin integrity will decrease Outcome: Progressing   

## 2019-10-30 NOTE — Progress Notes (Signed)
Physical Therapy Treatment Patient Details Name: [name] [name] MRN: [id number] DOB: April 08, 1980 Today's Date: 10/30/2019    History of Present Illness Patient is a 39 y/o female who presents with LLE wound now s/p I&D with wound vac application 17/5. + heroin and xanax on admission. PMh includes cocaine abuse, migraine, anxiety.    PT Comments    Continuing work on functional mobility and activity tolerance;  Pt agreed to perform functional transfer bed to [facility]; initiated trying to basic pivot without assistive device, but requested RW to help unweigh LLE in stance; minguard for transfers bed <>BSC; declined getting up to the recliner with me today; will continue to follow   Follow Up Recommendations  Home health PT;Supervision - Intermittent     Equipment Recommendations  Rolling walker with 5" wheels;3in1 (PT)    Recommendations for Other Services       Precautions / Restrictions Precautions Precautions: Fall;Other (comment) Precaution Comments: wound vac Restrictions LLE Weight Bearing: Weight bearing as tolerated    Mobility  Bed Mobility Overal bed mobility: Needs Assistance Bed Mobility: Supine to Sit;Sit to Supine     Supine to sit: HOB elevated;Supervision Sit to supine: HOB elevated;Supervision   General bed mobility comments: Supervision A for supine <> EOB with HOB elevated and increased time/effort. Assist for line management  Transfers Overall transfer level: Needs assistance Equipment used: None;Rolling walker (2 wheeled) Transfers: Sit to/from [facility] Sit to Stand: Min guard Stand pivot transfers: Min assist       General transfer comment: Stood from bed without RW, attempting to basic pivot to [facility] on her R without assistive device; became anxious, and requested RW to complete transfer  Ambulation/Gait Ambulation/Gait assistance: Min guard Gait Distance (Feet):  (Pivotal steps bed <>BSC) Assistive device: Rolling walker (2  wheeled)       General Gait Details: Tending to keep weight off of LLE despite cues for [employer]             Wheelchair Mobility    Modified Rankin (Stroke Patients Only)       Balance     Sitting balance-Leahy Scale: Good       Standing balance-Leahy Scale: Poor Standing balance comment: requries UE support for standing/transfers due to unwillingness to place weight through LLE                            Cognition Arousal/Alertness: Awake/alert Behavior During Therapy: [facility] for tasks assessed/performed;Anxious Overall Cognitive Status: Within Functional Limits for tasks assessed                                 General Comments: Distracted by pain.      Exercises      General Comments        Pertinent Vitals/Pain Pain Assessment: Faces Faces Pain Scale: Hurts even more Pain Location: LLE, with wieght bearing and attempts to get L heel on teh floor Pain Descriptors / Indicators: Discomfort;Grimacing;Guarding;Throbbing Pain Intervention(s): Monitored during session    Home Living                      Prior Function            PT Goals (current goals can now be found in the care plan section) Acute Rehab PT Goals Patient Stated Goal: to get pain under control PT Goal  Formulation: With patient Time For Goal Achievement: 11/07/19 Potential to Achieve Goals: Good Progress towards PT goals: Progressing toward goals (slowly)    Frequency    Min 3X/week      PT Plan Current plan remains appropriate    Co-evaluation              AM-PAC PT "6 Clicks" Mobility   Outcome Measure  Help needed turning from your back to your side while in a flat bed without using bedrails?: None Help needed moving from lying on your back to sitting on the side of a flat bed without using bedrails?: A Little Help needed moving to and from a bed to a chair (including a wheelchair)?: A Little Help needed standing up from  a chair using your arms (e.g., wheelchair or bedside chair)?: A Little Help needed to walk in hospital room?: A Little Help needed climbing 3-5 steps with a railing? : A Lot 6 Click Score: 18    End of Session   Activity Tolerance: Patient tolerated treatment well Patient left: in bed;with call bell/phone within reach;with nursing/sitter in room Nurse Communication: Mobility status PT Visit Diagnosis: Pain;Difficulty in walking, not elsewhere classified (R26.2) Pain - Right/Left: Left Pain - part of body: Leg     Time: 1620-1638 (minus 5 min while pt was on BSC) PT Time Calculation (min) (ACUTE ONLY): 18 min  Charges:  $Therapeutic Activity: 8-22 mins                     Roney Marion, [place] Pager [phone] Office [place] 10/30/2019, 4:57 PM

## 2019-10-31 ENCOUNTER — Encounter (HOSPITAL_COMMUNITY): Payer: Self-pay | Admitting: Orthopedic Surgery

## 2019-10-31 DIAGNOSIS — L03116 Cellulitis of left lower limb: Secondary | ICD-10-CM | POA: Diagnosis not present

## 2019-10-31 DIAGNOSIS — F1910 Other psychoactive substance abuse, uncomplicated: Secondary | ICD-10-CM | POA: Diagnosis not present

## 2019-10-31 DIAGNOSIS — B950 Streptococcus, group A, as the cause of diseases classified elsewhere: Secondary | ICD-10-CM | POA: Diagnosis not present

## 2019-10-31 DIAGNOSIS — M726 Necrotizing fasciitis: Secondary | ICD-10-CM | POA: Diagnosis not present

## 2019-10-31 LAB — CBC WITH DIFFERENTIAL/PLATELET
Abs Immature Granulocytes: 0.12 10*3/uL — ABNORMAL HIGH (ref 0.00–0.07)
Basophils Absolute: 0 10*3/uL (ref 0.0–0.1)
Basophils Relative: 0 %
Eosinophils Absolute: 0.1 10*3/uL (ref 0.0–0.5)
Eosinophils Relative: 2 %
HCT: 26.2 % — ABNORMAL LOW (ref 36.0–46.0)
Hemoglobin: 8.2 g/dL — ABNORMAL LOW (ref 12.0–15.0)
Immature Granulocytes: 2 %
Lymphocytes Relative: 29 %
Lymphs Abs: 1.5 10*3/uL (ref 0.7–4.0)
MCH: 27.9 pg (ref 26.0–34.0)
MCHC: 31.3 g/dL (ref 30.0–36.0)
MCV: 89.1 fL (ref 80.0–100.0)
Monocytes Absolute: 0.4 10*3/uL (ref 0.1–1.0)
Monocytes Relative: 7 %
Neutro Abs: 3 10*3/uL (ref 1.7–7.7)
Neutrophils Relative %: 60 %
Platelets: 344 10*3/uL (ref 150–400)
RBC: 2.94 MIL/uL — ABNORMAL LOW (ref 3.87–5.11)
RDW: 15.9 % — ABNORMAL HIGH (ref 11.5–15.5)
WBC: 5.1 10*3/uL (ref 4.0–10.5)
nRBC: 0.4 % — ABNORMAL HIGH (ref 0.0–0.2)

## 2019-10-31 LAB — AEROBIC/ANAEROBIC CULTURE W GRAM STAIN (SURGICAL/DEEP WOUND): Culture: NO GROWTH

## 2019-10-31 NOTE — Progress Notes (Signed)
Physical Therapy Treatment Patient Details Name: [name] [name] MRN: [id number] DOB: Jun 07, 1980 Today's Date: 10/31/2019    History of Present Illness Patient is a 39 y/o female who presents with LLE wound now s/p I&D with wound vac application 43/3. + heroin and xanax on admission. PMh includes cocaine abuse, migraine, anxiety.    PT Comments    Pt declined am treatment stating she was tired and did not get much sleep last night. Pt agreed to transfer from bed to [facility] only this afternoon. Pt reported she will have assistance from her husband and does not feel she needs HHPT. At this point pt is performing basic transfers with min guard assist to manage wound vac lines and IV. Pt does not need any physical assistance with bed mobility. Pt has been declining advancing mobility stating she is fine and can do it. Recommend home with family providing intermittent assistance. Recommend RW and 3-in-1. Pt will be on the list to see tomorrow prior to d/c to address any needs and possibly advance gait if she will participate.   Follow Up Recommendations  No PT follow up;Supervision - Intermittent     Equipment Recommendations  Rolling walker with 5" wheels;3in1 (PT)    Recommendations for Other Services       Precautions / Restrictions Precautions Precautions: Fall;Other (comment) Precaution Comments: wound vac Restrictions LLE Weight Bearing: Weight bearing as tolerated    Mobility  Bed Mobility Overal bed mobility: Needs Assistance Bed Mobility: Supine to Sit     Supine to sit: HOB elevated;Supervision Sit to supine: HOB elevated;Supervision   General bed mobility comments: therapist assist only to monitor lines for IV and wound vac, no physical assist needed  Transfers Overall transfer level: Needs assistance Equipment used: None Transfers: Stand Pivot Transfers   Stand pivot transfers: Min guard       General transfer comment: stand pivot holding onto bed rail and then BSC  to steady self. Therapist provided equipment management. Pt keeps left foot off floor due to c/o pain even though she is WBAT  Ambulation/Gait             General Gait Details: Pt declined ambulation   Stairs             Wheelchair Mobility    Modified Rankin (Stroke Patients Only)       Balance Overall balance assessment: Needs assistance Sitting-balance support: No upper extremity supported Sitting balance-Leahy Scale: Good     Standing balance support: During functional activity Standing balance-Leahy Scale: Fair                              Cognition Arousal/Alertness: Awake/alert Behavior During Therapy: WFL for tasks assessed/performed Overall Cognitive Status: Within Functional Limits for tasks assessed                                        Exercises      General Comments General comments (skin integrity, edema, etc.): Attempted to see patient a couple of times today and she did agree to [employer] only to [facility]. She declined any additional walking or therapeutic exercises.      Pertinent Vitals/Pain Pain Assessment: Faces Faces Pain Scale: Hurts little more Pain Location: left Leg Pain Descriptors / Indicators: Discomfort Pain Intervention(s): Limited activity within patient's tolerance;Monitored during session;Repositioned;Ice applied    Home  Living                      Prior Function            PT Goals (current goals can now be found in the care plan section) Progress towards PT goals: Progressing toward goals    Frequency    Min 3X/week      PT Plan Current plan remains appropriate    Co-evaluation              AM-PAC PT "6 Clicks" Mobility   Outcome Measure  Help needed turning from your back to your side while in a flat bed without using bedrails?: None Help needed moving from lying on your back to sitting on the side of a flat bed without using bedrails?: A Little Help needed  moving to and from a bed to a chair (including a wheelchair)?: A Little Help needed standing up from a chair using your arms (e.g., wheelchair or bedside chair)?: A Little Help needed to walk in hospital room?: A Little Help needed climbing 3-5 steps with a railing? : A Little 6 Click Score: 19    End of Session   Activity Tolerance: Other (comment) (self limiting) Patient left: in bed;with call bell/phone within reach Nurse Communication: Mobility status PT Visit Diagnosis: Pain;Difficulty in walking, not elsewhere classified (R26.2) Pain - Right/Left: Left Pain - part of body: Leg     Time: 1610-9604 PT Time Calculation (min) (ACUTE ONLY): 23 min  Charges:  $Therapeutic Activity: 23-37 mins                      [name] 10/31/2019, 2:24 PM

## 2019-10-31 NOTE — Progress Notes (Signed)
OT Cancellation Note  Patient Details Name: [name] [name] MRN: [id number] DOB: 07/30/1980   Cancelled Treatment:    Reason Eval/Treat Not Completed: Patient declined, no reason specified;Pain limiting ability to participate. Pt reporting she just woke up after not being able to get any sleep at night. Pt reports, "I told therapy to wait until the afternoon." Reoriented pt that it was afternoon. As soon as pt awakened, pt noted to be shaky and immediately pressed call button to request pain meds. Pt adamantly refused therapy at this time.   Layla Maw 10/31/2019, 1:26 PM

## 2019-10-31 NOTE — Progress Notes (Signed)
PT Cancellation Note  Patient Details Name: [name] [name] MRN: [id number] DOB: 04/08/1980   Cancelled Treatment:    Reason Eval/Treat Not Completed: Patient declined, no reason specified. Pt reported she didn't sleep well last night and doesn't feel like doing therapy this morning. PT will attempt therapy later this afternoon if time allows. Pt reported she is aware she may go home tomorrow and feels she is ready and able to transfer to [facility].    [name] 10/31/2019, 8:51 AM

## 2019-10-31 NOTE — Progress Notes (Signed)
Patient ID: [name] [name], female   DOB: 11-30-1980, 39 y.o.   MRN: [id number]  PROGRESS NOTE    [name] [name]  ACZ:[id number] DOB: 07/22/1980 DOA: 10/20/2019 PCP: [name], MD   Brief Narrative:   39 year old female with past medical history significant for IVDU, epidural abscess in October 2020, tobacco use, depression, anxiety and GERD was admitted with large infected left lower leg wound.  She was started on IV antibiotics.  She underwent debridement of the large left lower leg wound and placement of wound VAC.  Assessment & Plan:   Large left lower leg wound infection/necrotizing fasciitis -Status post debridement and placement of wound VAC on 10/22/19.  Wound cultures growing strep pyogenes.  Initially on vancomycin and cefepime.  Switched antibiotics to Rocephin on 10/25/2019. -Status post repeat debridement on 10/26/2019 followed by further surgical intervention with split thickness skin graft on 10/28/2019.  -Continue postop pain management as per orthopedics.  Antibiotic duration to be decided by orthopedics team. -CRP 3.1 on 10/30/2019. -PT recommends home health PT.  Leukocytosis -Resolved.  Normocytic anemia/anemia of chronic disease -Probably from above.  Hemoglobin stable.  Monitor  Left chest wall/breast cellulitis -Continue wound care and antibiotics plan as above  Substance abuse -Counseled to quit illicit drug use. -Continue to monitor for withdrawals.    DVT prophylaxis: SCDs Code Status:  Full code Family Communication: none at bedside Disposition Plan: Status is: Inpatient  Remains inpatient appropriate because:Inpatient level of care appropriate due to severity of illness.  Discharge home with home health PT once cleared by orthopedics.   Dispo: The patient is from: Home              Anticipated d/c is to: Home              Anticipated d/c date is: 1 day              Patient currently is not medically stable to d/c.   Consultants:  Orthopedics  Procedures: debridement and placement of wound VAC on 10/22/19 Repeat debridement on 10/26/2019  Antimicrobials:  Anti-infectives (From admission, onward)   Start     Dose/Rate Route Frequency Ordered Stop   10/25/19 1400  cefTRIAXone (ROCEPHIN) 2 g in sodium chloride 0.9 % 100 mL IVPB        2 g 200 mL/hr over 30 Minutes Intravenous Every 24 hours 10/25/19 1127     10/24/19 1200  vancomycin (VANCOCIN) IVPB 1000 mg/200 mL premix  Status:  Discontinued        1,000 mg 200 mL/hr over 60 Minutes Intravenous Every 12 hours 10/24/19 1112 10/25/19 1127   10/22/19 1300  ceFAZolin (ANCEF) IVPB 1 g/50 mL premix  Status:  Discontinued        1 g 100 mL/hr over 30 Minutes Intravenous Every 6 hours 10/22/19 1211 10/22/19 1220   10/22/19 0600  ceFAZolin (ANCEF) IVPB 2g/100 mL premix  Status:  Discontinued        2 g 200 mL/hr over 30 Minutes Intravenous On call to O.R. 10/21/19 1840 10/22/19 0753   10/21/19 0800  vancomycin (VANCOCIN) IVPB 1000 mg/200 mL premix  Status:  Discontinued        1,000 mg 200 mL/hr over 60 Minutes Intravenous Every 8 hours 10/21/19 0432 10/24/19 1112   10/21/19 0600  ceFEPIme (MAXIPIME) 2 g in sodium chloride 0.9 % 100 mL IVPB  Status:  Discontinued        2 g 200 mL/hr over 30 Minutes Intravenous Every  8 hours 10/21/19 0406 10/25/19 1127   10/21/19 0100  aztreonam (AZACTAM) injection 2 g  Status:  Discontinued        2 g Intravenous Every 8 hours 10/21/19 0027 10/21/19 0039   10/21/19 0100  aztreonam (AZACTAM) 2 g in sodium chloride 0.9 % 100 mL IVPB  Status:  Discontinued        2 g 200 mL/hr over 30 Minutes Intravenous Every 8 hours 10/21/19 0039 10/21/19 0356   10/21/19 0030  vancomycin (VANCOCIN) IVPB 1000 mg/200 mL premix        1,000 mg 200 mL/hr over 60 Minutes Intravenous  Once 10/21/19 0027 10/21/19 2703      Subjective: Patient seen and examined at bedside.  Denies overnight fever, vomiting, worsening shortness of breath or worsening pain.   Has intermittent left lower extremity pain.  Objective: Vitals:   10/30/19 0457 10/30/19 0729 10/30/19 1412 10/30/19 1925  BP: 117/72 116/75 120/74 114/66  Pulse: 89 90 89 96  Resp:  17 17 18   Temp: 99.8 F (37.7 C) 98.2 F (36.8 C) 98.2 F (36.8 C) 98.2 F (36.8 C)  TempSrc: Oral Oral Oral Oral  SpO2: 99% 98% 98% 98%  Weight:      Height:        Intake/Output Summary (Last 24 hours) at 10/31/2019 0730 Last data filed at 10/30/2019 2340 Gross per 24 hour  Intake 1028.26 ml  Output 313 ml  Net 715.26 ml   Filed Weights   10/25/19 0429 10/26/19 0059 10/27/19 0439  Weight: 96.6 kg 94.4 kg 92.4 kg    Examination:  General exam: No distress.  Poor historian.  Looks chronically ill. Respiratory system: Bilateral decreased breath sounds at bases with some scattered crackles  cardiovascular system: S1-S2 heard, rate controlled gastrointestinal system: Abdomen is obese, nondistended, soft and nontender.  Normal bowel sounds heard extremities: No clubbing.  Left lower extremity wound with dressing and wound VAC present  Data Reviewed: I have personally reviewed following labs and imaging studies  CBC: Recent Labs  Lab 10/27/19 0552 10/30/19 0348  WBC 14.2* 8.4  NEUTROABS 8.7* 5.7  HGB 8.3* 8.2*  HCT 26.4* 26.5*  MCV 89.8 90.8  PLT 327 500   Basic Metabolic Panel: Recent Labs  Lab 10/27/19 0552 10/29/19 1000 10/30/19 0348  NA 136  --  133*  K 3.5  --  4.3  CL 103  --  100  CO2 24  --  23  GLUCOSE 159*  --  103*  BUN 8  --  9  CREATININE 0.69 0.59 0.61  CALCIUM 8.4*  --  8.6*  MG 1.9  --   --    GFR: Estimated Creatinine Clearance: 97.9 mL/min (by C-G formula based on SCr of 0.61 mg/dL). Liver Function Tests: No results for input(s): AST, ALT, ALKPHOS, BILITOT, PROT, ALBUMIN in the last 168 hours. No results for input(s): LIPASE, AMYLASE in the last 168 hours. No results for input(s): AMMONIA in the last 168 hours. Coagulation Profile: No results for  input(s): INR, PROTIME in the last 168 hours. Cardiac Enzymes: No results for input(s): CKTOTAL, CKMB, CKMBINDEX, TROPONINI in the last 168 hours. BNP (last 3 results) No results for input(s): PROBNP in the last 8760 hours. HbA1C: Recent Labs    10/30/19 0348  HGBA1C 5.6   CBG: No results for input(s): GLUCAP in the last 168 hours. Lipid Profile: No results for input(s): CHOL, HDL, LDLCALC, TRIG, CHOLHDL, LDLDIRECT in the last 72 hours. Thyroid Function  Tests: No results for input(s): TSH, T4TOTAL, FREET4, T3FREE, THYROIDAB in the last 72 hours. Anemia Panel: No results for input(s): VITAMINB12, FOLATE, FERRITIN, TIBC, IRON, RETICCTPCT in the last 72 hours. Sepsis Labs: No results for input(s): PROCALCITON, LATICACIDVEN in the last 168 hours.  Recent Results (from the past 240 hour(s))  Surgical pcr screen     Status: None   Collection Time: 10/21/19  6:48 PM   Specimen: Nasal Mucosa; Nasal Swab  Result Value Ref Range Status   MRSA, PCR NEGATIVE NEGATIVE Final   Staphylococcus aureus NEGATIVE NEGATIVE Final    Comment: (NOTE) The Xpert SA Assay (FDA approved for NASAL specimens in patients 33 years of age and older), is one component of a comprehensive surveillance program. It is not intended to diagnose infection nor to guide or monitor treatment. Performed at [facility], [place] [address] [place], [place] [address]   Aerobic/Anaerobic Culture (surgical/deep wound)     Status: None   Collection Time: 10/22/19  8:37 AM   Specimen: Abscess  Result Value Ref Range Status   Specimen Description ABSCESS  Final   Special Requests LEFT SHIN SPEC A  Final   Gram Stain   Final    MODERATE WBC PRESENT,BOTH PMN AND MONONUCLEAR ABUNDANT GRAM POSITIVE COCCI IN PAIRS IN CHAINS IN CLUSTERS    Culture   Final    ABUNDANT GROUP A STREP (S.PYOGENES) ISOLATED NO ANAEROBES ISOLATED Performed at [facility], [place] [address] [place], [place] [address]    Report Status  10/27/2019 FINAL  Final  Aerobic/Anaerobic Culture (surgical/deep wound)     Status: None (Preliminary result)   Collection Time: 10/26/19  8:28 AM   Specimen: Soft Tissue, Other  Result Value Ref Range Status   Specimen Description TISSUE  Final   Special Requests LEFT TIBIA SPEC A  Final   Gram Stain   Final    ABUNDANT WBC PRESENT,BOTH PMN AND MONONUCLEAR RARE GRAM POSITIVE COCCI    Culture   Final    NO GROWTH 4 DAYS NO ANAEROBES ISOLATED; CULTURE IN PROGRESS FOR 5 DAYS Performed at [facility], [place] [address] [place], [place] [address]    Report Status PENDING  Incomplete         Radiology Studies: No results found.      Scheduled Meds:  celecoxib  200 mg Oral BID   enoxaparin (LOVENOX) injection  40 mg Subcutaneous Q24H   feeding supplement (ENSURE ENLIVE)  237 mL Oral TID BM   gabapentin  300 mg Oral TID   influenza vac split quadrivalent PF  0.5 mL Intramuscular Tomorrow-1000   multivitamin with minerals  1 tablet Oral Daily   senna-docusate  1 tablet Oral BID   sodium chloride flush  10-40 mL Intracatheter Q12H   sodium chloride flush  10-40 mL Intracatheter Q12H   Continuous Infusions:  sodium chloride Stopped (10/25/19 2006)   sodium chloride 75 mL/hr at 10/27/19 1756   sodium chloride 75 mL/hr at 10/28/19 1502   cefTRIAXone (ROCEPHIN)  IV 2 g (10/30/19 1347)   lactated ringers 300 mL/hr at 10/28/19 1229   methocarbamol (ROBAXIN) IV            Aline August, MD [facility] 10/31/2019, 7:30 AM

## 2019-10-31 NOTE — Plan of Care (Signed)
  Problem: Health Behavior/Discharge Planning: Goal: Ability to manage health-related needs will improve Outcome: Progressing   Problem: Activity: Goal: Risk for activity intolerance will decrease Outcome: Progressing   Problem: Pain Managment: Goal: General experience of comfort will improve Outcome: Not Progressing   

## 2019-10-31 NOTE — Progress Notes (Signed)
Patient ID: [name] [name], female   DOB: [birth year], 39 y.o.   MRN: [id number] Patient is more comfortable this morning but states she still has persistent pain.  She is status post skin graft to the left leg the back shows 75 cc of total drainage since surgery.  There are two checks with a good seal.  Anticipate patient can discharge to home with home health physical therapy tomorrow.

## 2019-10-31 NOTE — Progress Notes (Signed)
Nutrition Follow-up  DOCUMENTATION CODES:   Not applicable  INTERVENTION:   -Continue Ensure Enlive po TID, each supplement provides 350 kcal and 20 grams of protein -Magic cup TID with meals, each supplement provides 290 kcal and 9 grams of protein -Continue MVI with minerals daily  NUTRITION DIAGNOSIS:   Increased nutrient needs related to wound healing as evidenced by estimated needs.  Ongoing  GOAL:   Patient will meet greater than or equal to 90% of their needs  Progressing   MONITOR:   PO intake, Supplement acceptance, Skin  REASON FOR ASSESSMENT:   Consult Wound healing  ASSESSMENT:   39 yo female admitted with large infected left lower leg wound. S/P I&D 10/2. PMH includes IVDU, epidural abscess, tobacco use, depression, anxiety, GERD.   10/3- I&D L leg wound, VAC placement  10/6- debridement of L tibia, VAC placement  53/6- application of skin graft, wound closure, VAC placement  Pt endorses great appetite. Meal completions charted as 70-100% for her last seven meals. Reports drinking three Ensures daily. Plan d/c home tomorrow. Reiterated need for increased protein intake to promote post op healing.   Admission weight: 83 kg  Current weight: 92.4 kg   Wound VAC: 10 ml x 24 hrs  Medications: MVI with minerals, senokot Labs: Na 133 (L) Phosphorus 2.2 (L)   Diet Order:   Diet Order            Diet regular Room service appropriate? Yes; Fluid consistency: Thin  Diet effective now                 EDUCATION NEEDS:   Education needs have been addressed  Skin:  Skin Assessment: Skin Integrity Issues: Skin Integrity Issues:: Wound VAC Wound Vac: L leg wound s/p I&D  Last BM:  10/10  Height:   Ht Readings from Last 1 Encounters:  10/21/19 5\' 2"  (1.575 m)    Weight:   Wt Readings from Last 1 Encounters:  10/27/19 92.4 kg    Ideal Body Weight:  50 kg  BMI:  Body mass index is 37.26 kg/m.  Estimated Nutritional Needs:   Kcal:   2000-2200  Protein:  100-125 gm  Fluid:  >/= 2 L   Mariana Single RD, LDN Clinical Nutrition Pager listed in [place]

## 2019-11-01 DIAGNOSIS — M726 Necrotizing fasciitis: Secondary | ICD-10-CM | POA: Diagnosis not present

## 2019-11-01 DIAGNOSIS — L089 Local infection of the skin and subcutaneous tissue, unspecified: Secondary | ICD-10-CM | POA: Diagnosis not present

## 2019-11-01 DIAGNOSIS — F1910 Other psychoactive substance abuse, uncomplicated: Secondary | ICD-10-CM | POA: Diagnosis not present

## 2019-11-01 DIAGNOSIS — T148XXA Other injury of unspecified body region, initial encounter: Secondary | ICD-10-CM | POA: Diagnosis not present

## 2019-11-01 LAB — BASIC METABOLIC PANEL
Anion gap: 8 (ref 5–15)
BUN: 6 mg/dL (ref 6–20)
CO2: 27 mmol/L (ref 22–32)
Calcium: 8.9 mg/dL (ref 8.9–10.3)
Chloride: 99 mmol/L (ref 98–111)
Creatinine, Ser: 0.7 mg/dL (ref 0.44–1.00)
GFR, Estimated: >60 mL/min (ref >60)
Glucose, Bld: 141 mg/dL — ABNORMAL HIGH (ref 70–99)
Potassium: 4.1 mmol/L (ref 3.5–5.1)
Sodium: 134 mmol/L — ABNORMAL LOW (ref 135–145)

## 2019-11-01 LAB — MAGNESIUM: Magnesium: 1.8 mg/dL (ref 1.7–2.4)

## 2019-11-01 LAB — C-REACTIVE PROTEIN: CRP: 4.3 mg/dL — ABNORMAL HIGH (ref <1.0)

## 2019-11-01 MED ORDER — CEPHALEXIN 500 MG PO CAPS: 500.0000 mg | ORAL_CAPSULE | Freq: Four times a day (QID) | ORAL | 0 refills | Status: AC

## 2019-11-01 MED ORDER — GABAPENTIN 300 MG PO CAPS
300.0000 mg | ORAL_CAPSULE | Freq: Three times a day (TID) | ORAL | 0 refills | Status: DC
Start: 2019-11-01 — End: 2019-12-08

## 2019-11-01 MED ORDER — POLYETHYLENE GLYCOL 3350 17 G PO PACK: 17.0000 g | PACK | Freq: Every day | ORAL | 0 refills | Status: DC | PRN

## 2019-11-01 MED ORDER — HYDROCODONE-ACETAMINOPHEN 5-325 MG PO TABS
1.0000 | ORAL_TABLET | Freq: Four times a day (QID) | ORAL | 0 refills | Status: DC | PRN
Start: 2019-11-01 — End: 2019-11-11

## 2019-11-01 MED ORDER — SENNOSIDES-DOCUSATE SODIUM 8.6-50 MG PO TABS: 1.0000 | ORAL_TABLET | Freq: Two times a day (BID) | ORAL | 0 refills | Status: DC

## 2019-11-01 MED ORDER — BUSPIRONE HCL 10 MG PO TABS
10.0000 mg | ORAL_TABLET | Freq: Three times a day (TID) | ORAL | 0 refills | Status: DC | PRN
Start: 2019-11-01 — End: 2023-09-26

## 2019-11-01 MED ORDER — CELECOXIB 200 MG PO CAPS
200.0000 mg | ORAL_CAPSULE | Freq: Two times a day (BID) | ORAL | 0 refills | Status: DC
Start: 2019-11-01 — End: 2019-11-18

## 2019-11-01 MED ORDER — METHOCARBAMOL 500 MG PO TABS: 1000.0000 mg | ORAL_TABLET | Freq: Three times a day (TID) | ORAL | 0 refills | Status: AC | PRN

## 2019-11-01 NOTE — Progress Notes (Signed)
Pt IV removed, belongings gathered, DME (3n1) delivered pt reports having a RW at home. Pt wound vac connected, AVS reviewed. All questions answered to pt satisfaction.

## 2019-11-01 NOTE — Progress Notes (Addendum)
Is 4 days status post irrigation and debridement left tibia wound with placement of incisional wound VAC.  She is lying in bed appears comfortable and appropriate to conversation  Vital signs stable afebrile.  Wound VAC is working with 2 green checks.  Dressings clean dry and intact  From an orthopedic standpoint patient may discharge today with her Prevena VAC.  We will discharge her on 2 weeks of Keflex and pain medication should follow-up in our office in 1 week

## 2019-11-01 NOTE — Progress Notes (Addendum)
OT Cancellation Note  Patient Details Name: [name] [name] MRN: [id number] DOB: [birth year]   Cancelled Treatment:    Reason Eval/Treat Not Completed: Pain limiting ability to participate;Fatigue/lethargy limiting ability to participate;Other (comment) Pt declined OT session this morning stating she "hasn't slept all night." Mentioned the potential to DC later today and the importance of practicing mobility and ADLs in prep for DC with pt stating she plans to stay in the bed during the day at home and pivot over to [facility] for toileting with RW. Pt declined practicing transfer or ADLs with OTA despite encouragment. Will continue efforts as time allows.  Lanier Clam., COTA/L Acute Rehabilitation Services [phone] [place] 11/01/2019, 8:39 AM

## 2019-11-01 NOTE — Progress Notes (Addendum)
PT Cancellation Note  Patient Details Name: [name] [name] MRN: [id number] DOB: 04-Jan-1981   Cancelled Treatment:    Reason Eval/Treat Not Completed: Patient declined, no reason specified Patient refused physical therapy treatment at this date. Patient states she does not need to get up because she has taken a couple steps to [facility] before and her husband will be able to help her get inside the house. Discussed with patient the benefits of mobilizing and safety concerns regarding her return home with her not ambulating more than 2 feet at a time. Patient continued to refuse. Patient to discharge today. Will continue to follow as time allows.  Perrin Maltese, PT, DPT Acute Rehabilitation Services Pager [phone] Office [phone]   Alda [name] 11/01/2019, 10:54 AM

## 2019-11-01 NOTE — Plan of Care (Signed)

## 2019-11-01 NOTE — Discharge Summary (Signed)
Physician Discharge Summary  [name] [name] NGE:[id number] DOB: 01-04-1981 DOA: 10/20/2019  PCP: [name], MD  Admit date: 10/20/2019 Discharge date: 11/01/2019  Admitted From: Home Disposition: Home  Recommendations for Outpatient Follow-up:  1. Follow up with PCP in 1 week with repeat CBC/BMP 2. Outpatient follow-up with orthopedic/Dr. [name] Given.  Wound/wound care and activity as per Dr. [name] recommendations 3. Abstain from illicit drug use 4. Comply with medications and follow-up 5. Follow up in ED if symptoms worsen or new appear   Home Health: Home health PT Equipment/Devices: Wound VAC  Discharge Condition: Stable CODE STATUS: Full Diet recommendation: Regular  Brief/Interim Summary: 39 year old female with past medical history significant for IVDU, epidural abscess in October 2020, tobacco use, depression, anxiety and GERD was admitted with large infected left lower leg wound.  She was started on IV antibiotics.  She underwent debridement of the large left lower leg wound and placement of wound VAC.  Wound cultures grew strep pyogenes for which antibiotics were changed to Rocephin.  She underwent repeat debridement on 10/26/2019 followed by further surgical intervention with split skin graft on 10/28/2019.  Subsequently, orthopedics has cleared the patient for discharge home today on oral pain meds and antibiotics along with wound VAC.  Outpatient follow-up with orthopedics.  Discharge patient home today.  Discharge Diagnoses:   Large left lower leg wound infection/necrotizing fasciitis -Status post debridement and placement of wound VAC on 10/22/19.  Wound cultures growing strep pyogenes.  Initially on vancomycin and cefepime.  Switched antibiotics to Rocephin on 10/25/2019. -Status post repeat debridement on 10/26/2019 followed by further surgical intervention with split thickness skin graft on 10/28/2019.  -PT recommends home health PT. -Currently afebrile and  hemodynamically stable -Subsequently, orthopedics has cleared the patient for discharge home today on oral pain meds and antibiotics along with wound VAC.  Outpatient follow-up with orthopedics.  Discharge patient home today.   -Discharge narcotic regimen as per orthopedics recommendations.  Leukocytosis -Resolved.  Normocytic anemia/anemia of chronic disease -Probably from above.  Hemoglobin stable.  Monitor  Left chest wall/breast cellulitis -Continue wound care and antibiotics plan as above  Substance abuse -Counseled to quit illicit drug use. -Continue to monitor for withdrawals.   Anxiety -Continue bupropion as needed.  Outpatient follow-up with PCP. Discharge Instructions  Discharge Instructions    Diet general   Complete by: As directed    Discharge wound care:   Complete by: As directed    Wound/wound VAC care as per orthopedics recommendations   Increase activity slowly   Complete by: As directed    Negative Pressure Wound Therapy - Incisional   Complete by: As directed    Show patient how to attach prevena vac. Should call office if vac beeps or stops working     Allergies as of 11/01/2019      Reactions   Epidural Tray 17gx3-1-2" [nerve Block Tray] Other (See Comments)   Paralysis and severe pain in head/neck/shoulders.  No anaphylaxis.   Ibuprofen Hives   Penicillins Hives   Tolerated Cephalosporin 10/22/2019. Pt has tolerated cephalosporins on past admissions. Has patient had a PCN reaction causing immediate rash, facial/tongue/throat swelling, SOB or lightheadedness with hypotension: Yes Has patient had a PCN reaction causing severe rash involving mucus membranes or skin necrosis: No Has patient had a PCN reaction that required hospitalization No Has patient had a PCN reaction occurring within the last 10 years: No If all of the above answers are "NO", then may   Zofran [ondansetron Hcl] Nausea  And Vomiting      Medication List    STOP taking  these medications   buPROPion 150 MG 12 hr tablet Commonly known as: WELLBUTRIN SR   escitalopram 20 MG tablet Commonly known as: Lexapro   linezolid 600 MG tablet Commonly known as: Zyvox     TAKE these medications   busPIRone 10 MG tablet Commonly known as: BUSPAR Take 1 tablet (10 mg total) by mouth every 8 (eight) hours as needed (anxiety).   celecoxib 200 MG capsule Commonly known as: CELEBREX Take 1 capsule (200 mg total) by mouth 2 (two) times daily.   cephALEXin 500 MG capsule Commonly known as: KEFLEX Take 1 capsule (500 mg total) by mouth 4 (four) times daily for 10 days.   gabapentin 300 MG capsule Commonly known as: NEURONTIN Take 1 capsule (300 mg total) by mouth 3 (three) times daily.   HYDROcodone-acetaminophen 5-325 MG tablet Commonly known as: NORCO/VICODIN Take 1 tablet by mouth every 6 (six) hours as needed for moderate pain.   methocarbamol 500 MG tablet Commonly known as: ROBAXIN Take 2 tablets (1,000 mg total) by mouth every 8 (eight) hours as needed for up to 10 days for muscle spasms.   polyethylene glycol 17 g packet Commonly known as: MIRALAX / GLYCOLAX Take 17 g by mouth daily as needed for mild constipation.   senna-docusate 8.6-50 MG tablet Commonly known as: Senokot-S Take 1 tablet by mouth 2 (two) times daily.            Discharge Care Instructions  (From admission, onward)         Start     Ordered   11/01/19 0000  Discharge wound care:       Comments: Wound/wound VAC care as per orthopedics recommendations   11/01/19 0908          Follow-up Information    Persons, Bevely Palmer, PA In 1 week.   Specialty: Orthopedic Surgery Contact information: [place] [address] [phone]        [name], MD. Schedule an appointment as soon as possible for a visit in 1 week(s).   Specialty: Family Medicine Contact information: [place] [address] [phone]               Allergies  Allergen Reactions  . Epidural Tray 17gx3-1-2" [Nerve Block Tray] Other (See Comments)    Paralysis and severe pain in head/neck/shoulders.  No anaphylaxis.  . Ibuprofen Hives  . Penicillins Hives     Tolerated Cephalosporin 10/22/2019.   Pt has tolerated cephalosporins on past admissions. Has patient had a PCN reaction causing immediate rash, facial/tongue/throat swelling, SOB or lightheadedness with hypotension: Yes Has patient had a PCN reaction causing severe rash involving mucus membranes or skin necrosis: No Has patient had a PCN reaction that required hospitalization No Has patient had a PCN reaction occurring within the last 10 years: No If all of the above answers are "NO", then may  . Zofran [Ondansetron Hcl] Nausea And Vomiting    Consultations:  Orthopedics   Procedures/Studies: CT TIBIA FIBULA LEFT W CONTRAST  Result Date: 10/21/2019 CLINICAL DATA:  Wound to the left lower leg. Soft tissue infection is suspected. Worsening since September 4. EXAM: CT OF THE LOWER LEFT EXTREMITY WITH CONTRAST TECHNIQUE: Multidetector CT imaging of the lower right extremity was performed according to the standard protocol following intravenous contrast administration. COMPARISON:  Left knee radiographs 09/13/2005 CONTRAST:  149mL OMNIPAQUE IOHEXOL 300 MG/ML  SOLN FINDINGS: Bones/Joint/Cartilage Degenerative changes in the left knee. Visualized bones appear otherwise intact. No focal bone lesion or bone destruction. No cortical erosion or sclerosis to suggest osteomyelitis. No acute fracture or dislocation. Ligaments Suboptimally assessed by CT. Muscles and Tendons Normal appearance of the musculature. No intramuscular mass or hematoma. Soft tissues There is a large skin and subcutaneous defect demonstrated in the anterior lower leg with extension to the deep fascial compartment. Small amount of adjacent subcutaneous gas. Soft tissue thickening and edema throughout. No  loculated collections. IMPRESSION: 1. Large skin and subcutaneous defect in the anterior lower leg with extension to the deep fascial compartment. Small amount of adjacent subcutaneous gas. Soft tissue thickening and edema throughout. No loculated collections. 2. Degenerative changes in the left knee. 3. No evidence of osteomyelitis. Electronically Signed   By: Lucienne Capers M.D.   On: 10/21/2019 05:35   DG Chest Port 1 View  Result Date: 10/21/2019 CLINICAL DATA:  Possible sepsis. EXAM: PORTABLE CHEST 1 VIEW COMPARISON:  November 07, 2018 FINDINGS: Mildly decreased lung volumes are seen which is likely secondary to the degree of patient inspiration. Mild areas of linear atelectasis are noted within the bilateral lung bases. There is no evidence of a pleural effusion or pneumothorax. The heart size and mediastinal contours are within normal limits. The visualized skeletal structures are unremarkable. IMPRESSION: Mild bibasilar linear atelectasis. Electronically Signed   By: Virgina Norfolk M.D.   On: 10/21/2019 00:57       Subjective: Patient seen and examined at bedside.  Feels okay to go home today.  Denies worsening fever, nausea or vomiting.  Planes of intermittent left lower extremity pain.  Discharge Exam: Vitals:   11/01/19 0315 11/01/19 0856  BP: 101/64 110/73  Pulse: 99 89  Resp: 14 16  Temp: 98.7 F (37.1 C) 98.4 F (36.9 C)  SpO2: 91% 100%    General: Pt is alert, awake, not in acute distress.  Poor historian Cardiovascular: rate controlled, S1/S2 + Respiratory: bilateral decreased breath sounds at bases Abdominal: Soft, NT, ND, bowel sounds + Extremities: Left lower extremity wound with dressing and wound VAC present.    The results of significant diagnostics from this hospitalization (including imaging, microbiology, ancillary and laboratory) are listed below for reference.     Microbiology: Recent Results (from the past 240 hour(s))  Aerobic/Anaerobic Culture  (surgical/deep wound)     Status: None   Collection Time: 10/26/19  8:28 AM   Specimen: Soft Tissue, Other  Result Value Ref Range Status   Specimen Description TISSUE  Final   Special Requests LEFT TIBIA SPEC A  Final   Gram Stain   Final    ABUNDANT WBC PRESENT,BOTH PMN AND MONONUCLEAR RARE GRAM POSITIVE COCCI    Culture   Final    No growth aerobically or anaerobically. Performed at [facility], [place] [address], [place], [place] [address]    Report Status 10/31/2019 FINAL  Final     Labs: BNP (last 3 results) No results for input(s): BNP in the last 8760 hours. Basic Metabolic Panel: Recent Labs  Lab 10/27/19 0552 10/29/19 1000 10/30/19 0348 11/01/19 0345  NA 136  --  133* 134*  K 3.5  --  4.3 4.1  CL 103  --  100 99  CO2 24  --  23 27  GLUCOSE 159*  --  103* 141*  BUN 8  --  9 6  CREATININE 0.69 0.59 0.61 0.70  CALCIUM 8.4*  --  8.6* 8.9  MG 1.9  --   --  1.8   Liver Function Tests: No results for input(s): AST, ALT, ALKPHOS, BILITOT, PROT, ALBUMIN in the last 168 hours. No results for input(s): LIPASE, AMYLASE in the last 168 hours. No results for input(s): AMMONIA in the last 168 hours. CBC: Recent Labs  Lab 10/27/19 0552 10/30/19 0348 10/31/19 1850  WBC 14.2* 8.4 5.1  NEUTROABS 8.7* 5.7 3.0  HGB 8.3* 8.2* 8.2*  HCT 26.4* 26.5* 26.2*  MCV 89.8 90.8 89.1  PLT 327 341 344   Cardiac Enzymes: No results for input(s): CKTOTAL, CKMB, CKMBINDEX, TROPONINI in the last 168 hours. BNP: Invalid input(s): POCBNP CBG: No results for input(s): GLUCAP in the last 168 hours. D-Dimer No results for input(s): DDIMER in the last 72 hours. Hgb A1c Recent Labs    10/30/19 0348  HGBA1C 5.6   Lipid Profile No results for input(s): CHOL, HDL, LDLCALC, TRIG, CHOLHDL, LDLDIRECT in the last 72 hours. Thyroid function studies No results for input(s): TSH, T4TOTAL, T3FREE, THYROIDAB in the last 72 hours.  Invalid input(s): FREET3 Anemia work up No results for  input(s): VITAMINB12, FOLATE, FERRITIN, TIBC, IRON, RETICCTPCT in the last 72 hours. Urinalysis    Component Value Date/Time   COLORURINE YELLOW 10/21/2019 0542   APPEARANCEUR HAZY (A) 10/21/2019 0542   LABSPEC 1.010 10/21/2019 0542   PHURINE 8.5 (H) 10/21/2019 0542   GLUCOSEU NEGATIVE 10/21/2019 0542   HGBUR NEGATIVE 10/21/2019 0542   BILIRUBINUR NEGATIVE 10/21/2019 0542   KETONESUR NEGATIVE 10/21/2019 0542   PROTEINUR NEGATIVE 10/21/2019 0542   UROBILINOGEN 1.0 05/03/2012 2114   NITRITE NEGATIVE 10/21/2019 0542   LEUKOCYTESUR NEGATIVE 10/21/2019 0542   Sepsis Labs Invalid input(s): PROCALCITONIN,  WBC,  LACTICIDVEN Microbiology Recent Results (from the past 240 hour(s))  Aerobic/Anaerobic Culture (surgical/deep wound)     Status: None   Collection Time: 10/26/19  8:28 AM   Specimen: Soft Tissue, Other  Result Value Ref Range Status   Specimen Description TISSUE  Final   Special Requests LEFT TIBIA SPEC A  Final   Gram Stain   Final    ABUNDANT WBC PRESENT,BOTH PMN AND MONONUCLEAR RARE GRAM POSITIVE COCCI    Culture   Final    No growth aerobically or anaerobically. Performed at [facility], [place] [address] [place], [place] [address]    Report Status 10/31/2019 FINAL  Final     Time coordinating discharge: 35 minutes  SIGNED:   Aline August, MD  [facility] 11/01/2019, 9:09 AM

## 2019-11-01 NOTE — TOC Transition Note (Signed)
Transition of Care [facility]) - CM/SW Discharge Note   Patient Details  Name: [name] [name] MRN: [id number] Date of Birth: May 28, 1980  Transition of Care [facility]) CM/SW Contact:  Sharin Mons, RN Phone Number: [phone] 11/01/2019, 2:12 PM   Clinical Narrative:     Patient will DC to: home Anticipated DC date: 11/01/2019 Family notified: yes Transport by: car  Admitted with necrotizing Fascitis Left Tibia . From home with boyfriend , [name] [name]. Independent with ADL's, no DME usage PTA.    -s/p debridement L tibia, 10/6 and 2nd I & D with graft  and prevena vac application,10/8 Per MD patient ready for DC today . RN, patient, and patient's boy friend [name] [name] aware of DC. Pt without Rx med concerns or affordability.   Post hospital f/u appointments noted on AVS Noted HH order for PT. Pt agreeable to services [facility] services, however , NCM unable to land [facility] agency, provider made aware. RNCM will sign off for now as intervention is no longer needed. Please consult [place] again if new needs arise.   Final next level of care: [place] (resides with boyfriend, [name] [name]) Barriers to Discharge: No Barriers Identified   Patient Goals and CMS Choice     Choice offered to / list presented to : Patient  Discharge Placement                       Discharge Plan and Services                DME Arranged: 3-N-1 (already has rolling walker) DME Agency: [employer] Date DME Agency Contacted: 11/01/19 Time DME Agency Contacted: 73 Representative spoke with at DME Agency: Nurse to provide to pt from floor stock HH Arranged: PT          Social Determinants of Health ([place]) Interventions     Readmission Risk Interventions No flowsheet data found.

## 2019-11-10 ENCOUNTER — Inpatient Hospital Stay: Payer: Medicaid Other | Admitting: Orthopedic Surgery

## 2019-11-11 ENCOUNTER — Ambulatory Visit (INDEPENDENT_AMBULATORY_CARE_PROVIDER_SITE_OTHER): Payer: Medicaid Other | Admitting: Physician Assistant

## 2019-11-11 DIAGNOSIS — M726 Necrotizing fasciitis: Secondary | ICD-10-CM

## 2019-11-11 DIAGNOSIS — B950 Streptococcus, group A, as the cause of diseases classified elsewhere: Secondary | ICD-10-CM

## 2019-11-11 MED ORDER — SILVER SULFADIAZINE 1 % EX CREA: 1.0000 "application " | TOPICAL_CREAM | Freq: Every day | CUTANEOUS | 0 refills | Status: DC

## 2019-11-11 MED ORDER — HYDROCODONE-ACETAMINOPHEN 5-325 MG PO TABS
1.0000 | ORAL_TABLET | Freq: Four times a day (QID) | ORAL | 0 refills | Status: DC | PRN
Start: 2019-11-11 — End: 2019-12-08

## 2019-11-11 NOTE — Progress Notes (Signed)
Office Visit Note   Patient: [name] [name]           Date of Birth: February 12, 1980           MRN: [id number] Visit Date: 11/11/2019              Requested by: [name], MD [place],  [place] [address] PCP: [name], MD  No chief complaint on file.     HPI: Patient presents today.  Is post irrigation and debridement with application of skin graft to her leg.  She has has a wound VAC on for 2 weeks but has been unable to get here to have it removed.  She is requesting a refill on her pain medication.  Assessment & Plan: Visit Diagnoses: No diagnosis found.  Plan: Patient will begin daily cleansing with Dial soap and water.  Also applying Silvadene dressing which I will call in for her today.  She understands that she significantly increases wound healing if she continues to smoke.  Follow-up in 1 week.  Follow-Up Instructions: No follow-ups on file.   Ortho Exam  Patient is alert, oriented, no adenopathy, well-dressed, normal affect, normal respiratory effort. Focused examination demonstrates wound wound open with retention sutures across the central portion.  Skin graft is in place no surrounding cellulitis or foul odor.  Dry dressing was applied  Imaging: No results found. No images are attached to the encounter.  Labs: Lab Results  Component Value Date   HGBA1C 5.6 10/30/2019   HGBA1C 5.6 10/24/2019   HGBA1C 5.9 (H) 10/28/2018   ESRSEDRATE 98 (H) 10/20/2018   CRP 4.3 (H) 11/01/2019   CRP 3.1 (H) 10/30/2019   CRP 2.2 (H) 10/27/2019   REPTSTATUS 10/31/2019 FINAL 10/26/2019   GRAMSTAIN  10/26/2019    ABUNDANT WBC PRESENT,BOTH PMN AND MONONUCLEAR RARE GRAM POSITIVE COCCI    CULT  10/26/2019    No growth aerobically or anaerobically. Performed at [facility], [place] [address], [place], [place] [address]    LABORGA METHICILLIN RESISTANT STAPHYLOCOCCUS AUREUS 10/20/2018     Lab Results  Component Value Date   ALBUMIN  2.2 (L) 10/24/2019   ALBUMIN 2.6 (L) 10/21/2019   ALBUMIN 2.7 (L) 11/08/2018    Lab Results  Component Value Date   MG 1.8 11/01/2019   MG 1.9 10/27/2019   MG 2.0 10/24/2019   No results found for: VD25OH  No results found for: PREALBUMIN CBC EXTENDED Latest Ref Rng & Units 10/31/2019 10/30/2019 10/27/2019  WBC 4.0 - 10.5 K/uL 5.1 8.4 14.2(H)  RBC 3.87 - 5.11 MIL/uL 2.94(L) 2.92(L) 2.94(L)  HGB 12.0 - 15.0 g/dL 8.2(L) 8.2(L) 8.3(L)  HCT 36 - 46 % 26.2(L) 26.5(L) 26.4(L)  PLT 150 - 400 K/uL 344 341 327  NEUTROABS 1.7 - 7.7 K/uL 3.0 5.7 8.7(H)  LYMPHSABS 0.7 - 4.0 K/uL 1.5 1.7 3.6     There is no height or weight on file to calculate BMI.  Orders:  No orders of the defined types were placed in this encounter.  No orders of the defined types were placed in this encounter.    Procedures: No procedures performed  Clinical Data: No additional findings.  ROS:  All other systems negative, except as noted in the HPI. Review of Systems  Objective: Vital Signs: LMP 08/10/2011   Specialty Comments:  No specialty comments available.  PMFS History: Patient Active Problem List   Diagnosis Date Noted  . Necrotizing fasciitis due to Streptococcus  pyogenes ([place])   . Wound infection 10/21/2019  . Cellulitis 10/21/2019  . Substance abuse ([place]) 10/21/2019  . Anxiety 10/21/2019  . Fever   . 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  . Paresthesia of both legs   . [name]   . Urinary retention   . 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 IVDU (intravenous drug user)   . Septic embolism ([place])   . Loculated pleural effusion 10/20/2018  . RUQ pain 04/06/2018  . Breast mass, right 10/12/2017  . Neuropathy 07/14/2017  . Depression 05/22/2015  . MDD (major depressive disorder), recurrent severe, without psychosis ([place]) 09/22/2014  . Opioid use disorder, severe, dependence ([place]) 09/22/2014  . Alcohol use disorder 01/04/2012   Past Medical History:  Diagnosis  Date  . Anxiety    klonipin for stress disorder  . Chronic back pain   . Cocaine abuse ([place]) 07/06/2013  . Degenerative disc disease   . Endometriosis    chronic pain  . GERD (gastroesophageal reflux disease)    protonix evenings  . Migraine     Family History  Problem Relation Age of Onset  . Coronary artery disease Father   . Coronary artery disease Maternal Grandmother   . Breast cancer Mother   . Cancer Mother     Past Surgical History:  Procedure Laterality Date  . ABDOMINAL HYSTERECTOMY    . APPENDECTOMY    . APPLICATION OF WOUND VAC Left 10/28/2019   Procedure: APPLICATION OF WOUND VAC;  Surgeon: [name], MD;  Location: [facility];  Service: Orthopedics;  Laterality: Left;  . I & D EXTREMITY Left 10/22/2019   Procedure: IRRIGATION AND DEBRIDEMENT EXTREMITY; APPLICATION OF WOUND VAC;  Surgeon: Renette Butters, MD;  Location: [facility];  Service: Orthopedics;  Laterality: Left;  . I & D EXTREMITY Left 10/26/2019   Procedure: DEBRIDEMENT LEFT TIBIA;  Surgeon: [name], MD;  Location: [facility];  Service: Orthopedics;  Laterality: Left;  left  . I & D EXTREMITY Left 10/28/2019   Procedure: REPEAT DEBRIDEMENT LEFT TIBIA;  Surgeon: [name], MD;  Location: [facility];  Service: Orthopedics;  Laterality: Left;  . IR THORACENTESIS ASP PLEURAL SPACE W/IMG GUIDE  10/28/2018  . LAMINECTOMY FOR CEREBROSPINAL FLUID LEAK N/A 10/31/2018   Procedure: Thoracic Wound Exploration;  Surgeon: Ashok Pall, MD;  Location: [facility];  Service: Neurosurgery;  Laterality: N/A;  . LAPAROSCOPIC ASSISTED VAGINAL HYSTERECTOMY  08/19/2011   Procedure: LAPAROSCOPIC ASSISTED VAGINAL HYSTERECTOMY;  Surgeon: Gus Height, MD;  Location: [name];  Service: Gynecology;  Laterality: N/A;  . TEE WITHOUT CARDIOVERSION N/A 10/25/2018   Procedure: TRANSESOPHAGEAL ECHOCARDIOGRAM (TEE);  Surgeon: Josue Hector, MD;  Location: [facility];  Service: Cardiovascular;  Laterality: N/A;  . THORACIC LAMINECTOMY FOR EPIDURAL  ABSCESS N/A 10/20/2018   Procedure: THORACIC Eight, Thoracic nine LAMINECTOMY FOR EPIDURAL ABSCESS;  Surgeon: Eustace Moore, MD;  Location: [facility];  Service: Neurosurgery;  Laterality: N/A;  . TONSILLECTOMY    . TUBAL LIGATION     Social History   Occupational History  . Not on file  Tobacco Use  . Smoking status: Current Every Day Smoker    Packs/day: 1.00    Years: 14.00    Pack years: 14.00    Types: Cigarettes  . Smokeless tobacco: Never Used  . Tobacco comment: Smoking 1 PPD  Substance and Sexual Activity  . Alcohol use: No  . Drug use: Yes    Types: Heroin, IV  Comment: heroin  . Sexual activity: Yes

## 2019-11-14 ENCOUNTER — Encounter: Payer: Self-pay | Admitting: Physician Assistant

## 2019-11-14 ENCOUNTER — Telehealth: Payer: Self-pay

## 2019-11-14 NOTE — Telephone Encounter (Signed)
PA done through [facility]. PA approved for hydrocodone.  Confirmation #:[id number] WPrior Approval [id number] Status:APPROVED.   Called patient no answer. LMOM with details.

## 2019-11-15 ENCOUNTER — Telehealth: Payer: Self-pay | Admitting: Physician Assistant

## 2019-11-15 NOTE — Telephone Encounter (Signed)
Patient called advised she has moved and needed the Rx for Hydrocodone sent to the [facility] on [address] and cancel Rx at [facility] at [address]. The number to contact patient is [phone]

## 2019-11-15 NOTE — Telephone Encounter (Signed)
Please advise 

## 2019-11-15 NOTE — Telephone Encounter (Signed)
She will have to get that one there and then can switch

## 2019-11-16 NOTE — Telephone Encounter (Signed)
Patient aware and said that was fine

## 2019-11-17 ENCOUNTER — Telehealth: Payer: Self-pay | Admitting: Pediatric Intensive Care

## 2019-11-17 NOTE — Telephone Encounter (Signed)
Client referred for transportation services by Wynona Dove intern. Unable to leave [facility]. Lisette Abu RN BSN CNP [phone]

## 2019-11-18 ENCOUNTER — Other Ambulatory Visit: Payer: Self-pay | Admitting: Physician Assistant

## 2019-11-18 ENCOUNTER — Telehealth: Payer: Self-pay | Admitting: Physician Assistant

## 2019-11-18 ENCOUNTER — Inpatient Hospital Stay: Payer: Medicaid Other | Admitting: Physician Assistant

## 2019-11-18 MED ORDER — CELECOXIB 200 MG PO CAPS
200.0000 mg | ORAL_CAPSULE | Freq: Two times a day (BID) | ORAL | 0 refills | Status: DC
Start: 2019-11-18 — End: 2019-12-08

## 2019-11-18 MED ORDER — SILVER SULFADIAZINE 1 % EX CREA
1.0000 | TOPICAL_CREAM | Freq: Every day | CUTANEOUS | 0 refills | Status: DC
Start: 2019-11-18 — End: 2019-12-08

## 2019-11-18 NOTE — Telephone Encounter (Signed)
Can you please advise?

## 2019-11-18 NOTE — Telephone Encounter (Signed)
Patient missed her appt today due to no transportation and need medication refills. Please refill hydrocodone, silvadene, and celecoxib. Please send to [facility] on [address]. Patient phone number is [phone].

## 2019-11-18 NOTE — Telephone Encounter (Signed)
Can not refill hydrocodone until she is seen. Will refill other 2

## 2019-11-21 NOTE — Telephone Encounter (Signed)
Will address at appt tomorrow.

## 2019-11-21 NOTE — Telephone Encounter (Signed)
Called patient to advise. No answer. LMOM. Looks like she already has a scheduled appt.

## 2019-11-22 ENCOUNTER — Inpatient Hospital Stay: Payer: Medicaid Other | Admitting: Physician Assistant

## 2019-11-26 DIAGNOSIS — B950 Streptococcus, group A, as the cause of diseases classified elsewhere: Secondary | ICD-10-CM | POA: Diagnosis not present

## 2019-11-26 DIAGNOSIS — M726 Necrotizing fasciitis: Secondary | ICD-10-CM | POA: Diagnosis not present

## 2019-11-26 DIAGNOSIS — L089 Local infection of the skin and subcutaneous tissue, unspecified: Secondary | ICD-10-CM | POA: Diagnosis not present

## 2019-11-26 DIAGNOSIS — T148XXA Other injury of unspecified body region, initial encounter: Secondary | ICD-10-CM | POA: Diagnosis not present

## 2019-12-07 ENCOUNTER — Other Ambulatory Visit: Payer: Self-pay | Admitting: Physician Assistant

## 2019-12-07 ENCOUNTER — Telehealth: Payer: Self-pay | Admitting: Physician Assistant

## 2019-12-07 NOTE — Telephone Encounter (Signed)
Pt called stating she has staples in from surgery and they're cutting into her so she would like to get a numbing cream called in since she has an appt for 12/08/19 pt would also like to have her silvadene refilled    [phone]

## 2019-12-08 ENCOUNTER — Telehealth: Payer: Self-pay | Admitting: Orthopedic Surgery

## 2019-12-08 ENCOUNTER — Ambulatory Visit (INDEPENDENT_AMBULATORY_CARE_PROVIDER_SITE_OTHER): Payer: Medicaid Other | Admitting: Physician Assistant

## 2019-12-08 ENCOUNTER — Inpatient Hospital Stay: Payer: Medicaid Other | Admitting: Orthopedic Surgery

## 2019-12-08 DIAGNOSIS — M726 Necrotizing fasciitis: Secondary | ICD-10-CM

## 2019-12-08 DIAGNOSIS — B950 Streptococcus, group A, as the cause of diseases classified elsewhere: Secondary | ICD-10-CM

## 2019-12-08 MED ORDER — SILVER SULFADIAZINE 1 % EX CREA
1.0000 | TOPICAL_CREAM | Freq: Every day | CUTANEOUS | 0 refills | Status: DC
Start: 2019-12-08 — End: 2023-09-26

## 2019-12-08 MED ORDER — DOXYCYCLINE HYCLATE 100 MG PO TABS: 100.0000 mg | ORAL_TABLET | Freq: Two times a day (BID) | ORAL | 0 refills | Status: DC

## 2019-12-08 MED ORDER — GABAPENTIN 300 MG PO CAPS
300.0000 mg | ORAL_CAPSULE | Freq: Three times a day (TID) | ORAL | 0 refills | Status: DC
Start: 2019-12-08 — End: 2023-09-26

## 2019-12-08 MED ORDER — HYDROCODONE-ACETAMINOPHEN 5-325 MG PO TABS
1.0000 | ORAL_TABLET | Freq: Four times a day (QID) | ORAL | 0 refills | Status: DC | PRN
Start: 2019-12-08 — End: 2023-09-26

## 2019-12-08 MED ORDER — CELECOXIB 200 MG PO CAPS
200.0000 mg | ORAL_CAPSULE | Freq: Two times a day (BID) | ORAL | 0 refills | Status: DC
Start: 2019-12-08 — End: 2023-09-26

## 2019-12-08 NOTE — Telephone Encounter (Signed)
Called patient left message to return call to reschedule post op appointment with Dr [name] Given     Cell# [phone]

## 2019-12-08 NOTE — Telephone Encounter (Signed)
Will refill silvadene if ordered during appt today.

## 2019-12-08 NOTE — Telephone Encounter (Signed)
Called patient got recording  Call can not be completed at this time   Please hang up and try again later       Will try back later  [phone]

## 2019-12-09 ENCOUNTER — Encounter: Payer: Self-pay | Admitting: Orthopedic Surgery

## 2019-12-09 NOTE — Progress Notes (Signed)
Office Visit Note   Patient: [name] [name]           Date of Birth: 01-14-1981           MRN: [id number] Visit Date: 12/08/2019              Requested by: [name], MD [address] [place],  [place] [address] PCP: [name], MD  Chief Complaint  Patient presents with  . Left Leg - Routine Post Op    10/28/19 I&D LEFT TIBIA       HPI: Patient is 6 weeks status post irrigation and debridement of her left tibia.  She has been poor with follow-up.  She presents today for evaluation saying she has had significant increase in pain she does also admit that she has been doing quite a bit of traveling as her grandmother recently passed away  Assessment & Plan: Visit Diagnoses: No diagnosis found.  Plan: She will begin doxycycline twice a day.  We refilled her Celebrex her gabapentin and her hydrocodone.  She will do daily dressing changes with just a light film of Silvadene.  She is to follow-up in 1 week.  She will wash daily with Dial soap and water.  She also knows she is at significantly higher risk of failure of her surgery and infection secondary to her tobacco abuse  Follow-Up Instructions: No follow-ups on file.   Ortho Exam  Patient is alert, oriented, no adenopathy, well-dressed, normal affect, normal respiratory effort. Examination she has quite a bit of fibrinous tissue with some weeping of the wound.  Graft has not incorporated well at this point.  No surrounding cellulitis and overall the swelling is well controlled sutures are in place but there is been no contraction of the wound  Imaging: No results found. No images are attached to the encounter.  Labs: Lab Results  Component Value Date   HGBA1C 5.6 10/30/2019   HGBA1C 5.6 10/24/2019   HGBA1C 5.9 (H) 10/28/2018   ESRSEDRATE 98 (H) 10/20/2018   CRP 4.3 (H) 11/01/2019   CRP 3.1 (H) 10/30/2019   CRP 2.2 (H) 10/27/2019   REPTSTATUS 10/31/2019 FINAL 10/26/2019   GRAMSTAIN  10/26/2019      ABUNDANT WBC PRESENT,BOTH PMN AND MONONUCLEAR RARE GRAM POSITIVE COCCI    CULT  10/26/2019    No growth aerobically or anaerobically. Performed at [facility], [place] [address], [place], [place] [address]    LABORGA METHICILLIN RESISTANT STAPHYLOCOCCUS AUREUS 10/20/2018     Lab Results  Component Value Date   ALBUMIN 2.2 (L) 10/24/2019   ALBUMIN 2.6 (L) 10/21/2019   ALBUMIN 2.7 (L) 11/08/2018    Lab Results  Component Value Date   MG 1.8 11/01/2019   MG 1.9 10/27/2019   MG 2.0 10/24/2019   No results found for: VD25OH  No results found for: PREALBUMIN CBC EXTENDED Latest Ref Rng & Units 10/31/2019 10/30/2019 10/27/2019  WBC 4.0 - 10.5 K/uL 5.1 8.4 14.2(H)  RBC 3.87 - 5.11 MIL/uL 2.94(L) 2.92(L) 2.94(L)  HGB 12.0 - 15.0 g/dL 8.2(L) 8.2(L) 8.3(L)  HCT 36 - 46 % 26.2(L) 26.5(L) 26.4(L)  PLT 150 - 400 K/uL 344 341 327  NEUTROABS 1.7 - 7.7 K/uL 3.0 5.7 8.7(H)  LYMPHSABS 0.7 - 4.0 K/uL 1.5 1.7 3.6     There is no height or weight on file to calculate BMI.  Orders:  No orders of the defined types were placed in this encounter.  Meds ordered this  encounter  Medications  . silver sulfADIAZINE (SILVADENE) 1 % cream    Sig: Apply 1 application topically daily.    Dispense:  50 g    Refill:  0  . doxycycline (VIBRA-TABS) 100 MG tablet    Sig: Take 1 tablet (100 mg total) by mouth 2 (two) times daily.    Dispense:  60 tablet    Refill:  0  . HYDROcodone-acetaminophen (NORCO/VICODIN) 5-325 MG tablet    Sig: Take 1 tablet by mouth every 6 (six) hours as needed for moderate pain.    Dispense:  30 tablet    Refill:  0  . gabapentin (NEURONTIN) 300 MG capsule    Sig: Take 1 capsule (300 mg total) by mouth 3 (three) times daily.    Dispense:  90 capsule    Refill:  0  . celecoxib (CELEBREX) 200 MG capsule    Sig: Take 1 capsule (200 mg total) by mouth 2 (two) times daily.    Dispense:  30 capsule    Refill:  0     Procedures: No procedures  performed  Clinical Data: No additional findings.  ROS:  All other systems negative, except as noted in the HPI. Review of Systems  Objective: Vital Signs: LMP 08/10/2011   Specialty Comments:  No specialty comments available.  PMFS History: Patient Active Problem List   Diagnosis Date Noted  . Necrotizing fasciitis due to Streptococcus pyogenes ([place])   . Wound infection 10/21/2019  . Cellulitis 10/21/2019  . Substance abuse ([place]) 10/21/2019  . Anxiety 10/21/2019  . Fever   . 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  . Paresthesia of both legs   . [name]   . Urinary retention   . 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 IVDU (intravenous drug user)   . Septic embolism ([place])   . Loculated pleural effusion 10/20/2018  . RUQ pain 04/06/2018  . Breast mass, right 10/12/2017  . Neuropathy 07/14/2017  . Depression 05/22/2015  . MDD (major depressive disorder), recurrent severe, without psychosis ([place]) 09/22/2014  . Opioid use disorder, severe, dependence ([place]) 09/22/2014  . Alcohol use disorder 01/04/2012   Past Medical History:  Diagnosis Date  . Anxiety    klonipin for stress disorder  . Chronic back pain   . Cocaine abuse ([place]) 07/06/2013  . Degenerative disc disease   . Endometriosis    chronic pain  . GERD (gastroesophageal reflux disease)    protonix evenings  . Migraine     Family History  Problem Relation Age of Onset  . Coronary artery disease Father   . Coronary artery disease Maternal Grandmother   . Breast cancer Mother   . Cancer Mother     Past Surgical History:  Procedure Laterality Date  . ABDOMINAL HYSTERECTOMY    . APPENDECTOMY    . APPLICATION OF WOUND VAC Left 10/28/2019   Procedure: APPLICATION OF WOUND VAC;  Surgeon: [name], MD;  Location: [facility];  Service: Orthopedics;  Laterality: Left;  . I & D EXTREMITY Left 10/22/2019   Procedure: IRRIGATION AND DEBRIDEMENT EXTREMITY; APPLICATION OF WOUND VAC;  Surgeon: Renette Butters, MD;  Location: [facility];  Service: Orthopedics;  Laterality: Left;  . I & D EXTREMITY Left 10/26/2019   Procedure: DEBRIDEMENT LEFT TIBIA;  Surgeon: [name], MD;  Location: [facility];  Service: Orthopedics;  Laterality: Left;  left  . I & D EXTREMITY Left 10/28/2019   Procedure: REPEAT DEBRIDEMENT LEFT TIBIA;  Surgeon:  [name], MD;  Location: [facility];  Service: Orthopedics;  Laterality: Left;  . IR THORACENTESIS ASP PLEURAL SPACE W/IMG GUIDE  10/28/2018  . LAMINECTOMY FOR CEREBROSPINAL FLUID LEAK N/A 10/31/2018   Procedure: Thoracic Wound Exploration;  Surgeon: Ashok Pall, MD;  Location: [facility];  Service: Neurosurgery;  Laterality: N/A;  . LAPAROSCOPIC ASSISTED VAGINAL HYSTERECTOMY  08/19/2011   Procedure: LAPAROSCOPIC ASSISTED VAGINAL HYSTERECTOMY;  Surgeon: Gus Height, MD;  Location: [facility];  Service: Gynecology;  Laterality: N/A;  . TEE WITHOUT CARDIOVERSION N/A 10/25/2018   Procedure: TRANSESOPHAGEAL ECHOCARDIOGRAM (TEE);  Surgeon: Josue Hector, MD;  Location: [facility];  Service: Cardiovascular;  Laterality: N/A;  . THORACIC LAMINECTOMY FOR EPIDURAL ABSCESS N/A 10/20/2018   Procedure: THORACIC Eight, Thoracic nine LAMINECTOMY FOR EPIDURAL ABSCESS;  Surgeon: Eustace Moore, MD;  Location: [facility];  Service: Neurosurgery;  Laterality: N/A;  . TONSILLECTOMY    . TUBAL LIGATION     Social History   Occupational History  . Not on file  Tobacco Use  . Smoking status: Current Every Day Smoker    Packs/day: 1.00    Years: 14.00    Pack years: 14.00    Types: Cigarettes  . Smokeless tobacco: Never Used  . Tobacco comment: Smoking 1 PPD  Substance and Sexual Activity  . Alcohol use: No  . Drug use: Yes    Types: Heroin, IV    Comment: heroin  . Sexual activity: Yes

## 2019-12-19 ENCOUNTER — Ambulatory Visit: Payer: Medicaid Other | Admitting: Orthopedic Surgery

## 2020-01-05 ENCOUNTER — Other Ambulatory Visit: Payer: Self-pay | Admitting: Physician Assistant

## 2020-01-06 ENCOUNTER — Telehealth: Payer: Self-pay | Admitting: Orthopedic Surgery

## 2020-01-06 NOTE — Telephone Encounter (Signed)
Pt called asking for a refill of her celebrex for her arm pain, she would like to have this sent to the [facility] on [address].  [phone]

## 2020-01-09 ENCOUNTER — Ambulatory Visit: Payer: Medicaid Other | Admitting: Orthopedic Surgery

## 2020-01-09 NOTE — Telephone Encounter (Signed)
Pt had appt today and rescheduled for tomorrow. Will address at office visit.

## 2020-01-10 ENCOUNTER — Ambulatory Visit: Payer: Medicaid Other | Admitting: Orthopedic Surgery

## 2020-12-20 ENCOUNTER — Ambulatory Visit: Payer: Medicaid Other | Admitting: Orthopedic Surgery

## 2021-01-27 IMAGING — RF DG THORACIC SPINE 1V
1 series · 2 of 2 positions shown · non-contrast
Comparison: MRI 10/20/2018

CLINICAL DATA: Thoracic laminectomy for epidural abscess

EXAM:
DG C-ARM 1-60 MIN; THORACIC SPINE - 1 VIEW
FLUOROSCOPY TIME:  Fluoroscopy Time:  7 seconds
Number of Acquired Spot Images: 2

[Series 1: run · 2 of 2 slices shown]
[im 1/2]
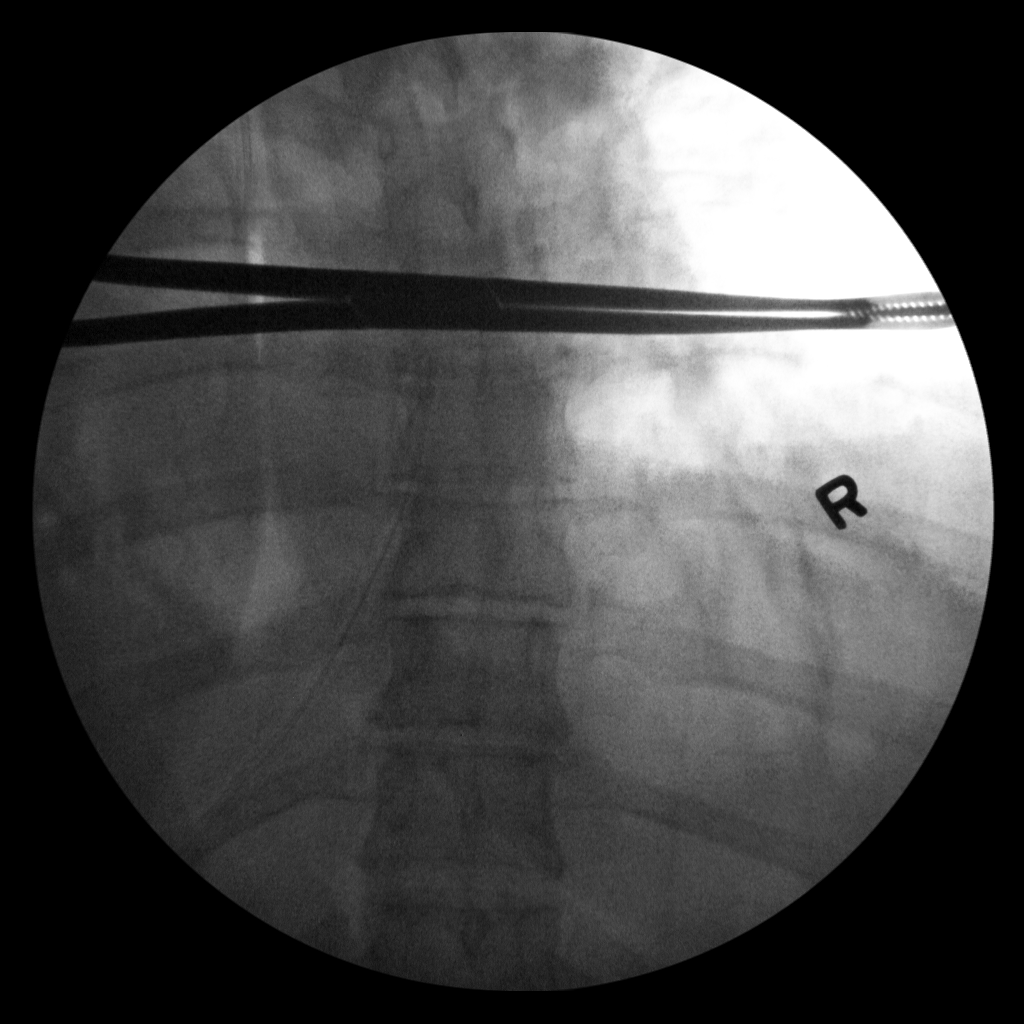
[im 2/2]
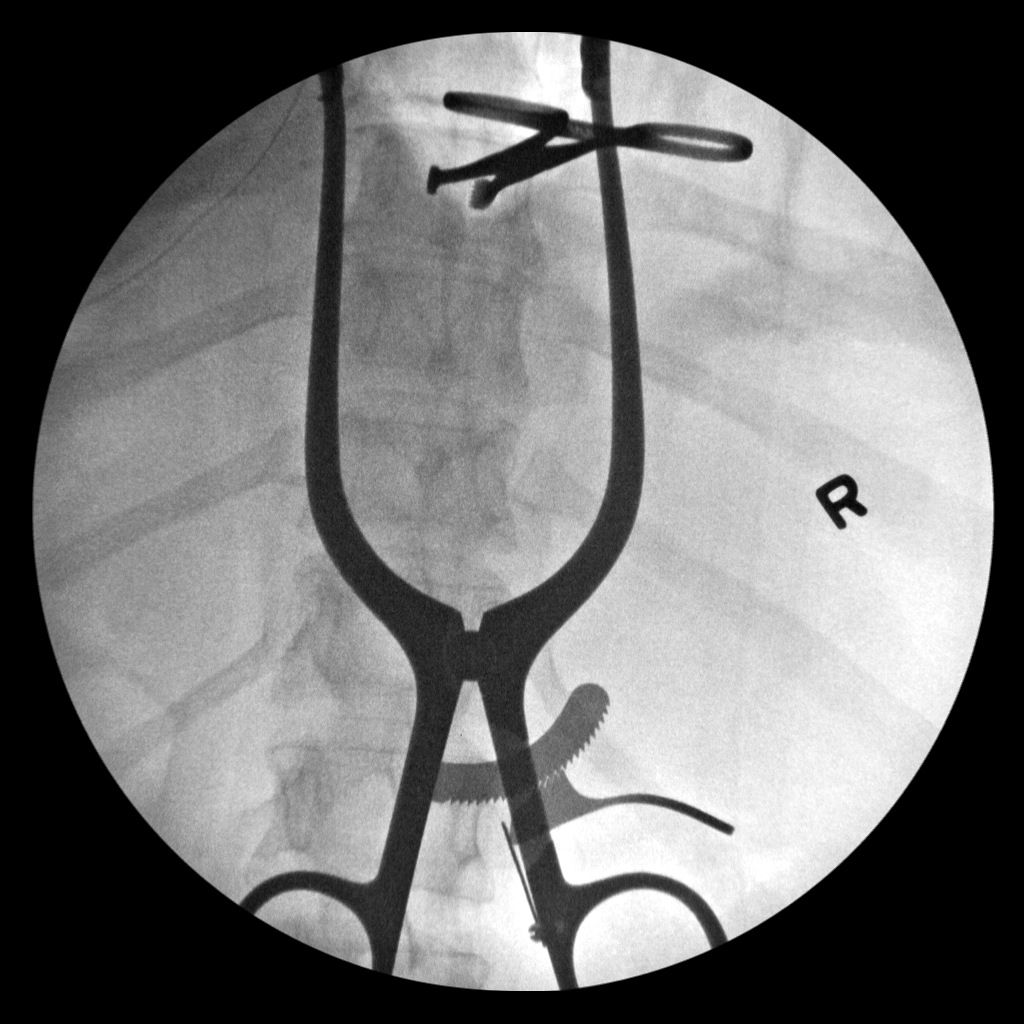

[2 of 2 positions shown; findings below may reference images not displayed]

FINDINGS: Limited fluoroscopic views of the lower thoracic spine reveals
surgical retractors at the level of the T9 vertebral body. Limited
evaluation of the osseous structures given frontal only projections.
IMPRESSION: Intraoperative fluoroscopic views of the lower thoracic spine
demonstrate retractor placement and surgical implements at the level
of it has T9.

## 2021-01-27 IMAGING — MR MR HEAD WO/W CM
10 of 12 series · 38 of 48 positions shown · IV contrast (gadavist)
Comparison: Comparison made with prior CT of the thoracic and
lumbar spine performed earlier the same day.

CLINICAL DATA: Initial evaluation for acute numbness, tingling,
paresthesias. Recent fall.

EXAM:
MRI HEAD WITHOUT AND WITH CONTRAST
MRI CERVICAL SPINE WITHOUT AND WITH CONTRAST
TECHNIQUE: Multiplanar, multiecho pulse sequences of the brain and surrounding
structures, and cervical spine, to include the craniocervical
junction and cervicothoracic junction, were obtained without and
with intravenous contrast.
CONTRAST:  8mL GADAVIST GADOBUTROL 1 MMOL/ML IV SOLN

[Series 15: DWI · axial · 3.0mm · 0.88mm/px · z∈[-157,-18]mm · 8 of 96 slices shown (1 of 4)]
[im 1/96]
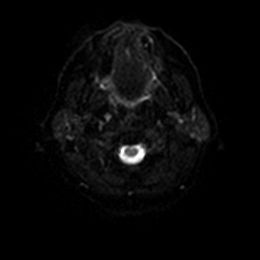
[im 14/96]
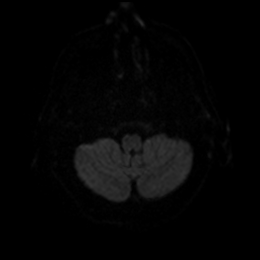
[im 28/96]
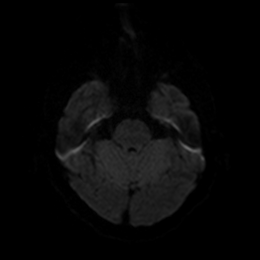
[im 41/96]
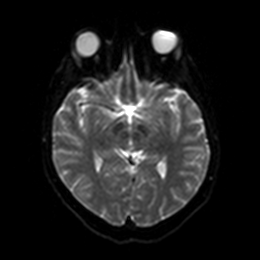
[im 55/96]
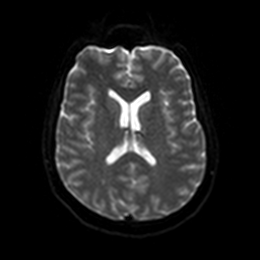
[im 68/96]
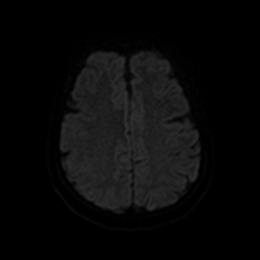
[im 82/96]
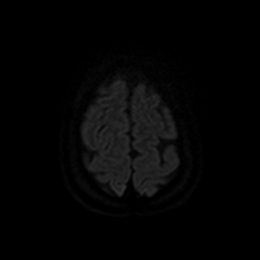
[im 96/96]
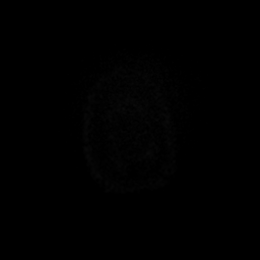

[Series 16: DWI · axial · 3.0mm · 0.88mm/px · z∈[-157,-18]mm · 4 of 48 slices shown (2 of 4)]
[im 1/48]
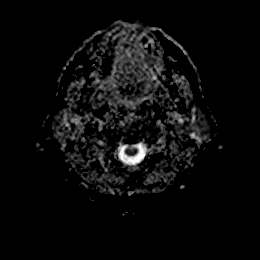
[im 16/48]
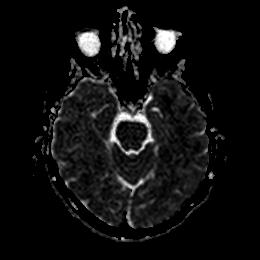
[im 32/48]
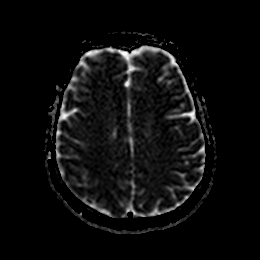
[im 48/48]
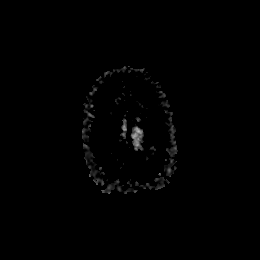

[Series 17: DWI · coronal · 4.0mm · 0.88mm/px · 6 of 64 slices shown (3 of 4)]
[im 1/64]
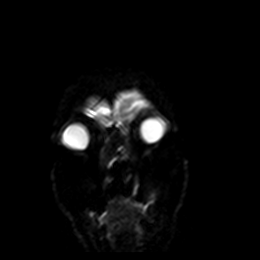
[im 13/64]
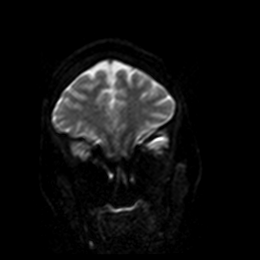
[im 26/64]
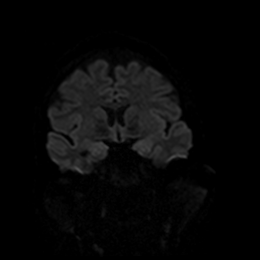
[im 38/64]
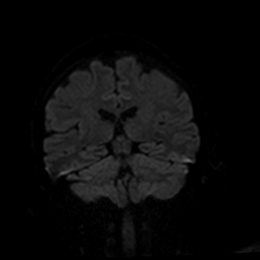
[im 51/64]
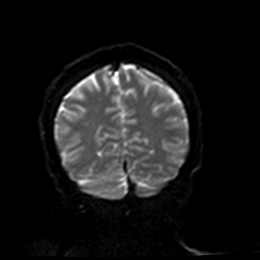
[im 64/64]
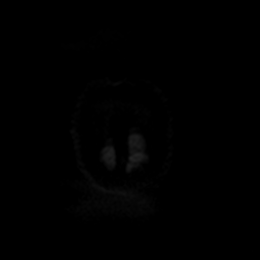

[Series 18: DWI · coronal · 4.0mm · 0.88mm/px · 3 of 32 slices shown (4 of 4)]
[im 1/32]
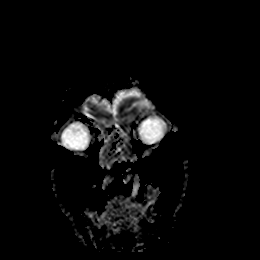
[im 16/32]
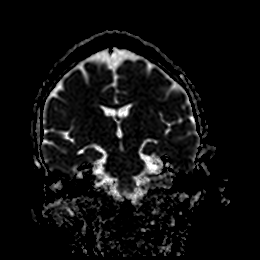
[im 32/32]
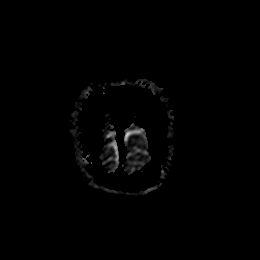

[Series 19: T1 · sagittal · 5.0mm · 0.75mm/px · 2 of 23 slices shown]
[im 1/23]
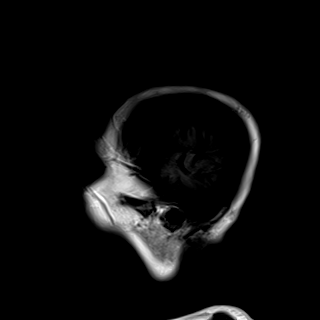
[im 23/23]
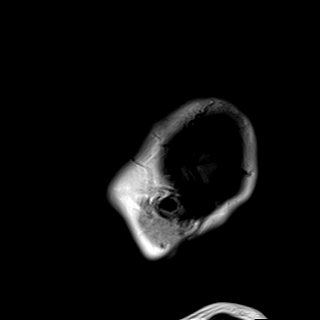

[Series 20: T2 · axial · 5.0mm · 0.72mm/px · z∈[-158,-16]mm · 2 of 25 slices shown]
[im 1/25]
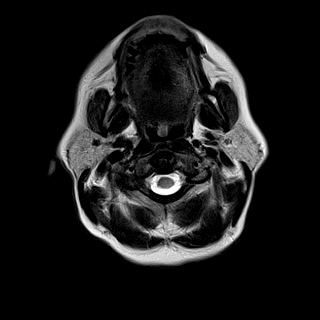
[im 25/25]
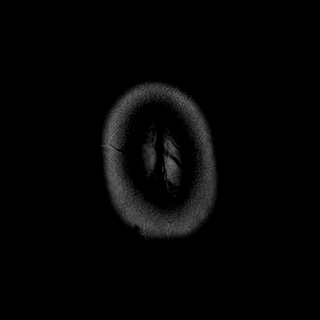

[Series 21: FLAIR · axial · 5.0mm · 0.45mm/px · z∈[-156,-14]mm · 2 of 25 slices shown]
[im 1/25]
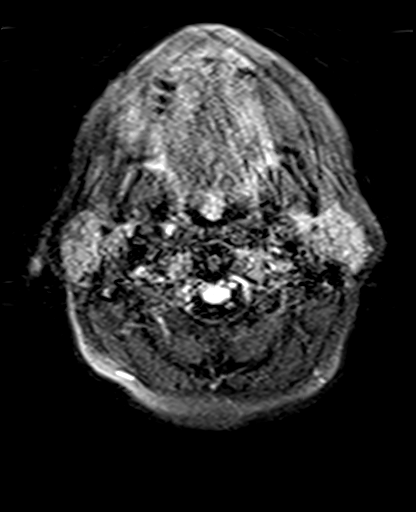
[im 25/25]
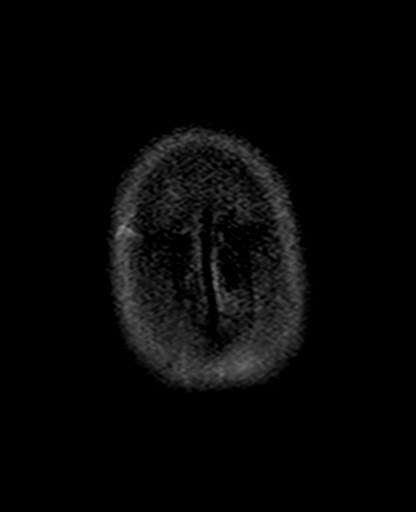

[Series 23: pha_images · axial · 3.0mm · 0.90mm/px · z∈[-155,-16]mm · 4 of 48 slices shown]
[im 1/48]
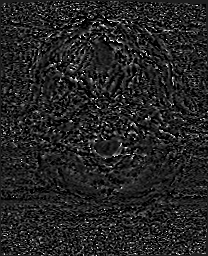
[im 16/48]
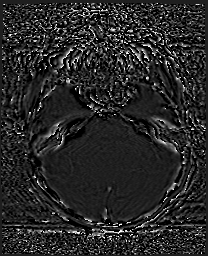
[im 32/48]
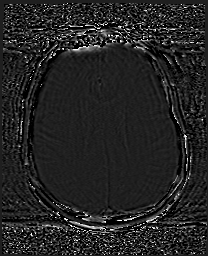
[im 48/48]
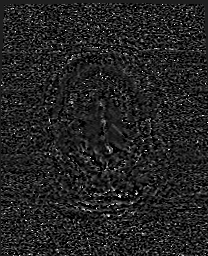

[Series 24: swi_images · axial · 3.0mm · 0.90mm/px · z∈[-161,-10]mm · 5 of 52 slices shown]
[im 1/52]
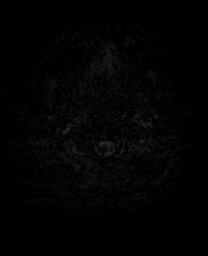
[im 13/52]
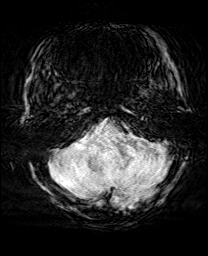
[im 26/52]
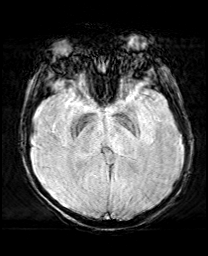
[im 39/52]
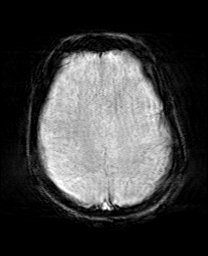
[im 52/52]
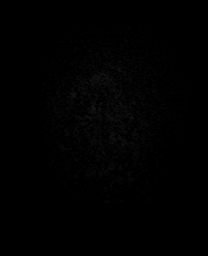

[Series 27: T2 post-contrast · coronal · 5.0mm · 0.62mm/px · 2 of 28 slices shown]
[im 1/28]
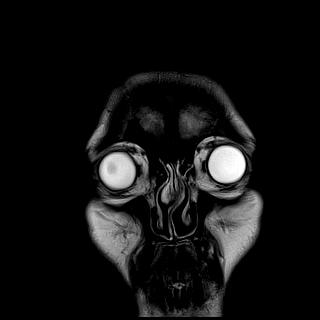
[im 28/28]
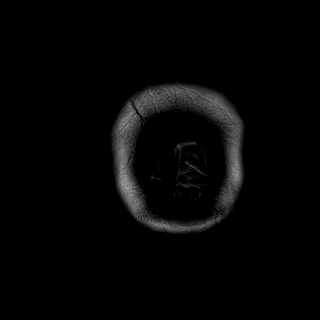

[38 of 48 positions shown; findings below may reference images not displayed]

FINDINGS: MRI HEAD FINDINGS

Brain: Examination moderately degraded by motion artifact.

Cerebral volume within normal limits for patient age. Approximate 1
cm chronic lacunar type infarct noted involving the left thalamus.
No other focal parenchymal signal abnormality.

No abnormal foci of restricted diffusion to suggest acute or
subacute ischemia. Gray-white matter differentiation well
maintained. No encephalomalacia to suggest chronic infarction. No
foci of susceptibility artifact to suggest acute or chronic
intracranial hemorrhage.

No mass lesion, midline shift or mass effect. No hydrocephalus. No
extra-axial fluid collection. Major dural sinuses are grossly
patent.

Pituitary gland and suprasellar region are normal. Midline
structures intact and normal.

No definite abnormal enhancement on this motion degraded exam.

Vascular: Major intracranial vascular flow voids well maintained and
normal in appearance.

Skull and upper cervical spine: Craniocervical junction normal.
Visualized upper cervical spine within normal limits. Bone marrow
signal intensity normal. No scalp soft tissue abnormality.

Sinuses/Orbits: Globes and orbital soft tissues within normal
limits.

Paranasal sinuses are largely clear. No mastoid effusion. Inner ear
structures normal.

Other: None.

MRI CERVICAL SPINE FINDINGS

Alignment: Examination moderately degraded by motion artifact.

Straightening of the normal cervical lordosis. No listhesis or
malalignment.

Vertebrae: Vertebral body height maintained without evidence for
acute or chronic fracture. Bone marrow signal intensity diffusely
decreased on T1 weighted imaging, nonspecific, but most commonly
related to anemia, smoking, or obesity. No discrete or worrisome
osseous lesions. No abnormal marrow edema or enhancement.

Cord: Signal intensity within the cervical spinal cord is within
normal limits. Normal cord caliber and morphology. No abnormal
enhancement.

Posterior Fossa, vertebral arteries, paraspinal tissues:
Craniocervical junction within normal limits. Paraspinous and
prevertebral soft tissues are normal. Normal intravascular flow
voids seen within the vertebral arteries bilaterally.

Disc levels:

C2-C3: Unremarkable.

C3-C4: Mild left eccentric disc bulge with uncovertebral
hypertrophy. Flattening of the ventral thecal sac without
significant spinal stenosis. Mild left C4 foraminal narrowing. No
significant right foraminal stenosis.

C4-C5:  Unremarkable.

C5-C6:  Mild annular disc bulge.  No canal or foraminal stenosis.

C6-C7: Mild disc bulge. No significant canal or foraminal stenosis.

C7-T1:  Unremarkable.

MRI THORACIC SPINE FINDINGS

Alignment: Dextroscoliosis. Alignment otherwise normal with
preservation of the normal thoracic kyphosis. No listhesis.

Vertebrae: Vertebral body height maintained without evidence for
acute or chronic fracture. Visualized bone marrow signal intensity
diffusely decreased on T1 weighted imaging. No discrete or worrisome
osseous lesions. Reactive marrow edema seen about the left T8-9
facet with associated small joint effusion (series 17, image 8).
Finding concerning for possible septic arthritis. Possible
involvement of the adjacent left T8 and T9 costovertebral
articulations. No other abnormal marrow edema or definite evidence
for osteomyelitis.

Cord: Epidural collection involving the dorsal epidural space
extending from T7 through T10 is seen, highly suspicious for
epidural abscess. This measures approximately 0.7 x 2.2 x 6.8 cm in
maximal dimensions (AP by transverse by craniocaudad) (series 21,
image 5). Abnormal epidural enhancement involves the ventral
epidural space at these levels as well without frank abscess.
Resultant up to severe spinal stenosis, most pronounced at the level
of T8-9. Thecal sac measures 5 mm in AP diameter at its most narrow
point (series 18, image 19). No definite cord signal changes.
Remainder of the thoracic spinal cord and spinal canal within normal
limits.

Paraspinal soft tissues: Mild localized edema and enhancement
centered about the left T8-9 facet. No discrete soft tissue abscess
or collection. Irregular complex multiloculated left pleural
effusion, partially visualize, but concerning for possible
concomitant empyema. Associated irregular pleural enhancement.
Additional small layering right pleural effusion with associated
atelectasis. Visualized visceral structures otherwise unremarkable.
No overt evidence for discitis.

Disc levels:

T1-2:  Unremarkable.

T2-3: Unremarkable.

T3-4:  Unremarkable.

T4-5:  Unremarkable.

T5-6: Tiny left paracentral disc protrusion mildly flattens the left
ventral thecal sac. No significant stenosis.

T6-7: Tiny left paracentral disc protrusion mildly flattens the left
ventral thecal sac. No significant stenosis.

T7-8: Small central disc protrusion mildly indents the ventral
thecal sac. Superior aspect of the dorsal epidural abscess. No
significant stenosis.

T8-9: Small central disc protrusion mildly indents the ventral
thecal sac. Superimposed epidural abscess. Resultant fairly severe
spinal stenosis. Foramina remain patent.

T9-10: Mild disc bulge with superimposed small central disc
protrusion. Epidural abscess. Resultant fairly severe spinal
stenosis. Foramina remain patent.

T10-11:  Unremarkable.

T11-12:  Unremarkable.

T12-L1:  Unremarkable.

MRI LUMBAR SPINE FINDINGS

Segmentation: Examination moderately degraded by motion artifact.

Standard segmentation. Lowest well-formed disc labeled the L5-S1
level.

Alignment: Physiologic with preservation of the normal lumbar
lordosis. No listhesis.

Vertebrae: Vertebral body height maintained without evidence for
acute or chronic fracture. Diffusely decreased T1 weighted signal
seen throughout the visualized bone marrow. No discrete or worrisome
osseous lesions. No evidence for osteomyelitis discitis.

Conus: Conus medullaris terminates at the L1 level and is normal in
appearance. Nerve roots of the cauda equina grossly within normal
limits.

Paraspinal soft tissues: Paraspinous soft tissues demonstrate no
acute finding. Visualized visceral structures grossly unremarkable.

Disc levels:

L1-2:  Unremarkable.

L2-3:  Unremarkable.

L3-4:  Unremarkable.

L4-5: Negative interspace. Moderate bilateral facet hypertrophy. No
stenosis or impingement.

L5-S1: Broad-based left subarticular disc protrusion with associated
annular fissure. Protruding disc impinges upon the descending left
S1 nerve root in the left lateral recess. Mild facet hypertrophy.
Resultant moderate left lateral recess stenosis. Central canal
remains widely patent. No foraminal stenosis.
IMPRESSION: MRI HEAD IMPRESSION:

1. No acute intracranial abnormality identified.
2. Small remote left thalamic lacunar infarct.
3. Otherwise unremarkable and normal brain MRI.

MRI CERVICAL SPINE IMPRESSION:

1. No acute abnormality within the cervical spine. No evidence for
acute infection.
2. Mild left eccentric disc bulge at C3-4 with resultant mild left
C4 foraminal stenosis.
3. Additional minor noncompressive disc bulging at C5-6 and C6-7
without stenosis or impingement.

MRI THORACIC SPINE IMPRESSION:

1. Approximate 0.7 x 2.2 x 6.8 cm dorsal epidural abscess extending
from T7 through T10 with resultant severe spinal stenosis as
detailed above. No cord signal changes at this time.
2. Reactive marrow edema and enhancement about the left T8-9 facet,
concerning for septic arthritis, with possible involvement of the
adjacent T8 and T9 costovertebral articulations. No overt evidence
for discitis at this time.
3. Complex multiloculated left pleural effusion, concerning for
possible empyema given the findings in the adjacent thoracic spine.
4. Small layering right pleural effusion with associated
atelectasis.

MRI LUMBAR SPINE IMPRESSION:

1. No acute abnormality within the lumbar spine. No evidence for
acute infection.
2. Left subarticular disc protrusion at L5-S1, impinging upon the
descending left S1 nerve root in the left lateral recess.
3. Moderate bilateral facet hypertrophy at L4-5 without stenosis.

Current attempt is being made to contact the covering clinician.
Results will be communicated as soon as possible.

## 2021-01-27 IMAGING — CT CT L SPINE W/O CM
3 series · 12 of 33 positions shown, 14 images · IV contrast (Omni 300)
Comparison: CT scan from 7382

CLINICAL DATA: Fell 5 days ago. Numbness in lower extremities and
right rib pain.

EXAM:
CT THORACIC AND LUMBAR SPINE WITHOUT CONTRAST
TECHNIQUE: Multidetector CT imaging of the thoracic and lumbar spine was
performed without contrast. Multiplanar CT image reconstructions
were also generated.

[Series 1: l-spine 2.0 bone · axial · 0.29mm/px · z∈[+959,+1103]mm · 4 of 105 slices shown, 5 images]
[im 17/105  soft-tissue]
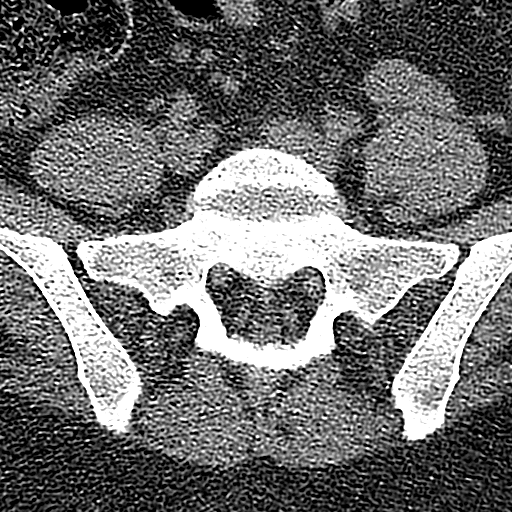
[im 17/105  bone]
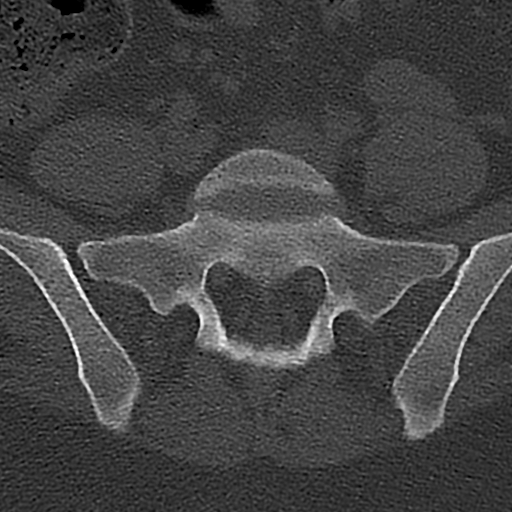
[im 41/105  bone]
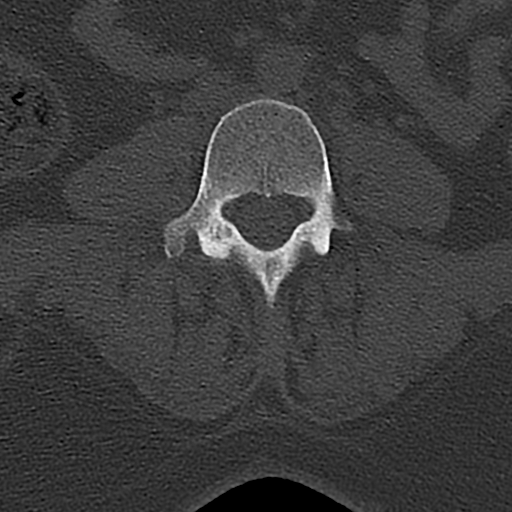
[im 65/105  bone]
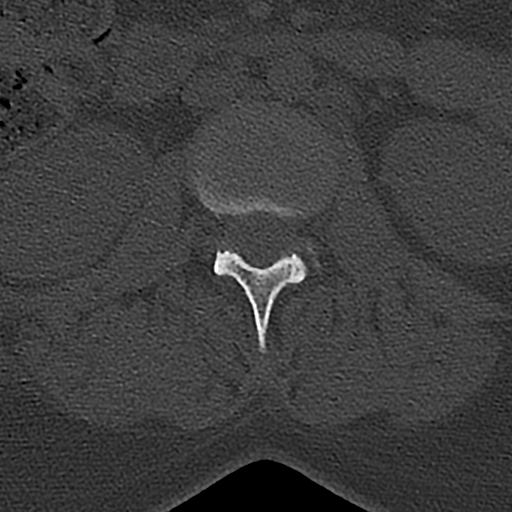
[im 89/105  bone]
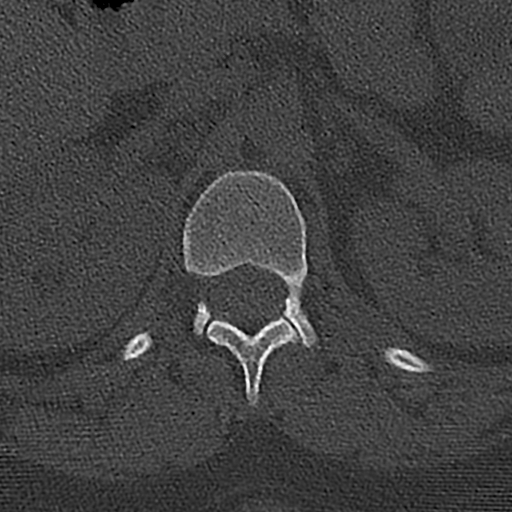

[Series 4: l-spine 2.0 bone coronal · coronal · 0.29mm/px · 3 of 65 slices shown]
[im 13/65  bone]
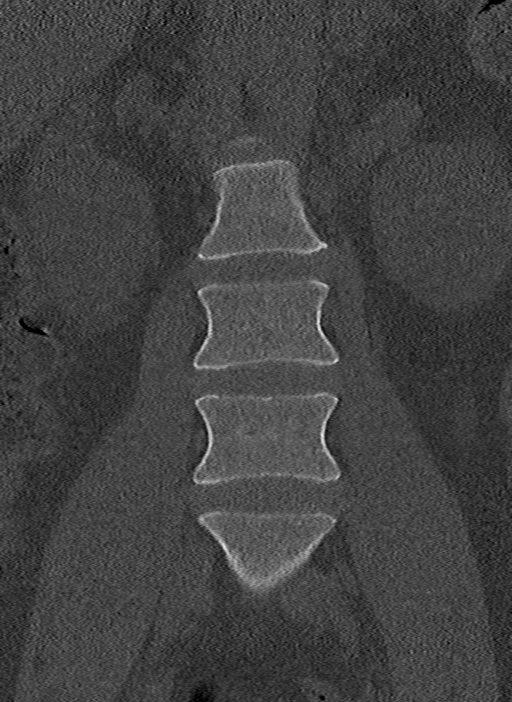
[im 26/65  bone]
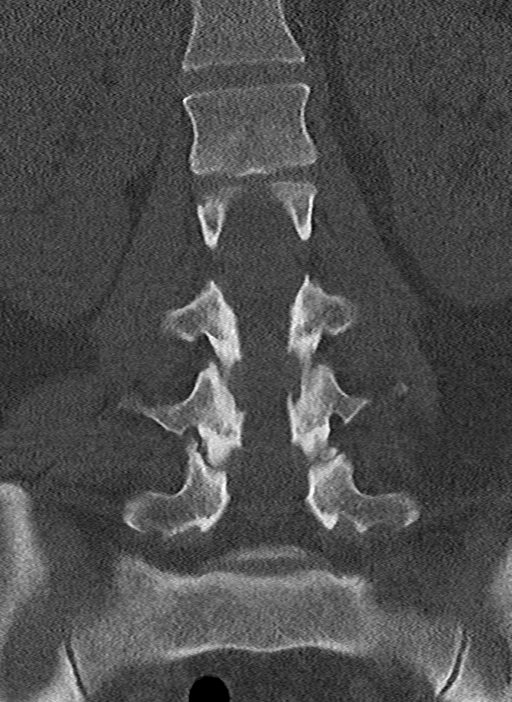
[im 39/65  bone]
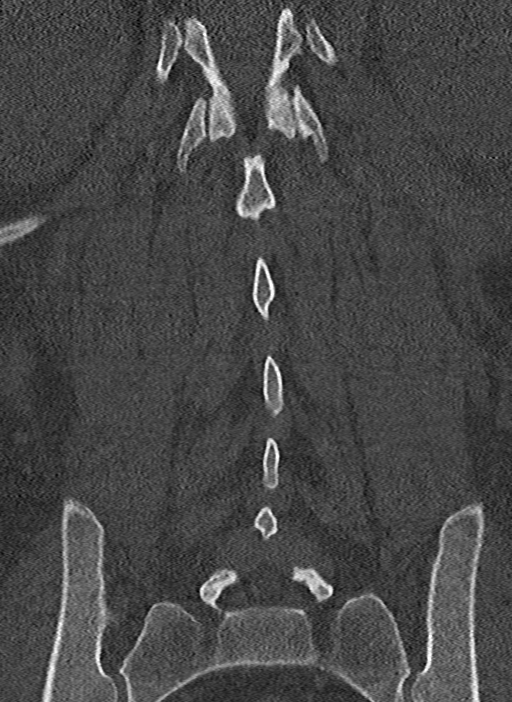

[Series 5: l-spine 2.0 bone sagittal · sagittal · 0.25mm/px · 5 of 65 slices shown, 6 images]
[im 22/65  bone]
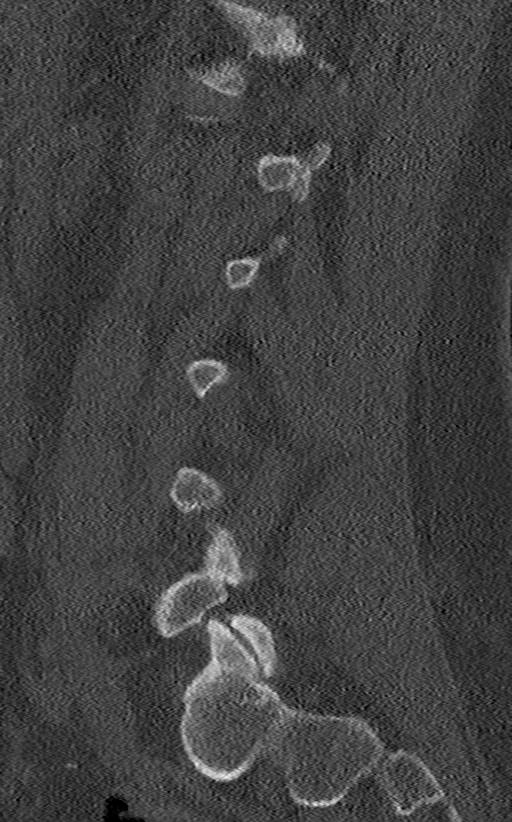
[im 27/65  bone]
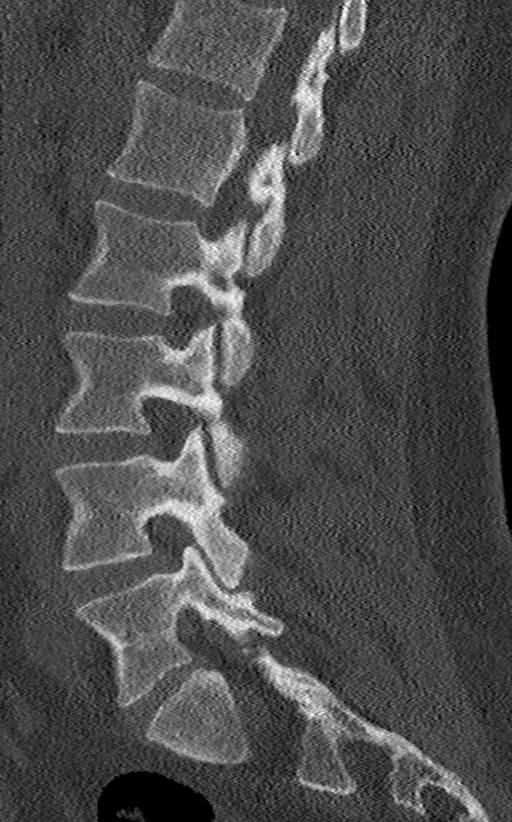
[im 33/65  soft-tissue]
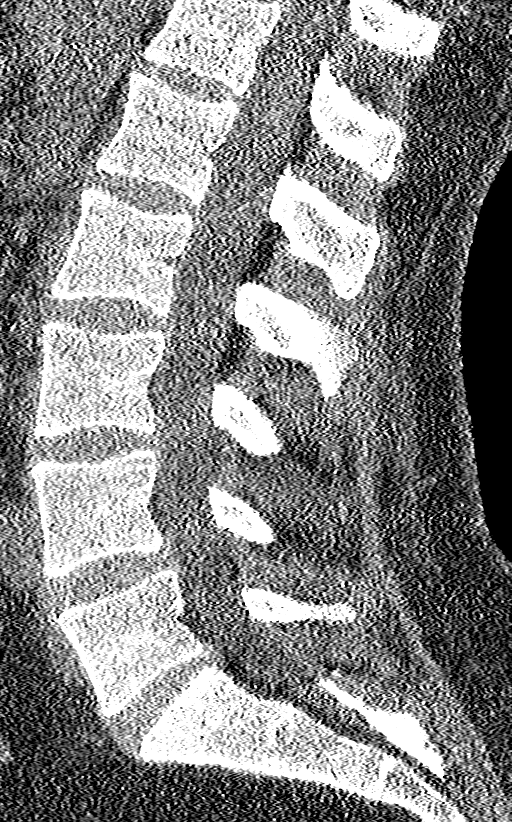
[im 33/65  bone]
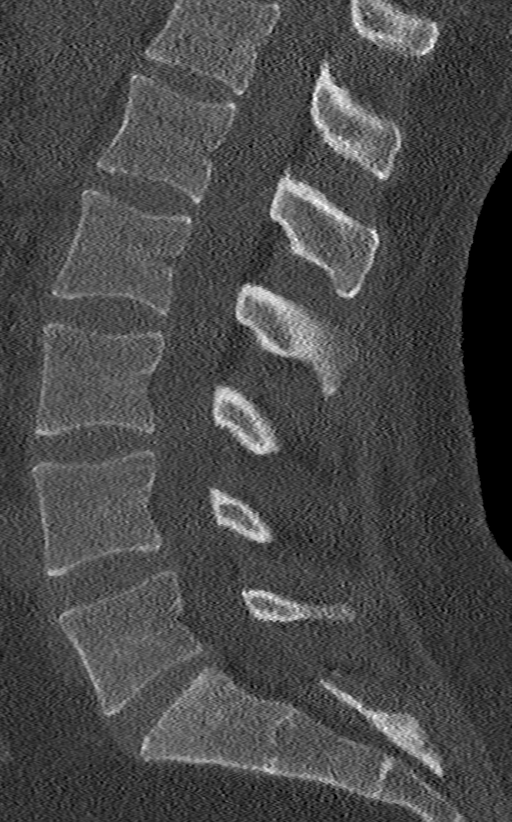
[im 38/65  bone]
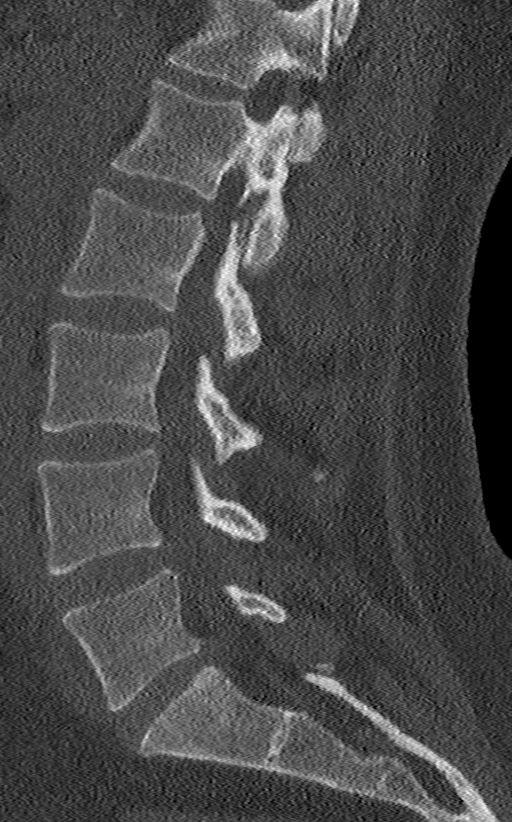
[im 43/65  bone]
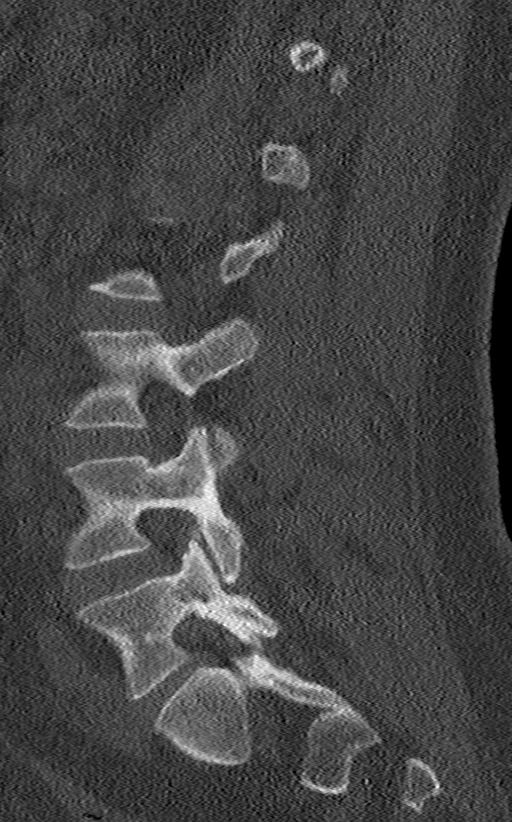

[12 of 33 positions shown; findings below may reference images not displayed]

FINDINGS: CT THORACIC SPINE FINDINGS

Alignment: Scoliosis but normal alignment in the sagittal plane.

Vertebrae: No acute bony findings.  Mild degenerative changes.

Paraspinal and other soft tissues: No significant paraspinal
findings. A moderate-sized left pleural effusion is noted with
overlying atelectasis. No obvious posterior rib fractures.

Disc levels: The spinal canal is generous. No obvious thoracic disc
protrusions or canal compromise. No pedicle, posterior element or
facet fractures.

CT LUMBAR SPINE FINDINGS

Segmentation: There are five lumbar type vertebral bodies. The last
full intervertebral disc space is labeled L5-S1.

Alignment: Normal

Vertebrae: No lumbar fracture is identified. The facets are normally
aligned. No facet or laminar fracture. No pars defects.

Paraspinal and other soft tissues: No significant paraspinal or
retroperitoneal findings.

Disc levels: There is a broad-based left paracentral disc protrusion
at L5-S1 which appears to contact the left S1 nerve root. The other
intervertebral discs are unremarkable.
IMPRESSION: CT THORACIC SPINE IMPRESSION

Scoliosis and mild degenerative changes but no acute fracture or
spinal canal compromise.

CT LUMBAR SPINE IMPRESSION

Normal alignment and no acute bony findings.

Shallow broad-based left paracentral disc protrusion noted at L5-S1.

## 2023-07-30 ENCOUNTER — Ambulatory Visit: Admitting: Orthopedic Surgery

## 2023-09-25 ENCOUNTER — Inpatient Hospital Stay (HOSPITAL_COMMUNITY)
Admission: EM | Admit: 2023-09-25 | Discharge: 2023-10-06 | DRG: 871 | Disposition: A | Discharge status: Home / Self Care | Attending: Internal Medicine | Admitting: Internal Medicine

## 2023-09-25 ENCOUNTER — Emergency Department (HOSPITAL_COMMUNITY)

## 2023-09-25 ENCOUNTER — Other Ambulatory Visit: Payer: Self-pay

## 2023-09-25 DIAGNOSIS — A419 Sepsis, unspecified organism: Principal | ICD-10-CM | POA: Diagnosis present

## 2023-09-25 DIAGNOSIS — Z1152 Encounter for screening for COVID-19: Secondary | ICD-10-CM

## 2023-09-25 DIAGNOSIS — J189 Pneumonia, unspecified organism: Secondary | ICD-10-CM | POA: Diagnosis present

## 2023-09-25 DIAGNOSIS — Z90710 Acquired absence of both cervix and uterus: Secondary | ICD-10-CM

## 2023-09-25 DIAGNOSIS — F1910 Other psychoactive substance abuse, uncomplicated: Secondary | ICD-10-CM | POA: Diagnosis present

## 2023-09-25 DIAGNOSIS — E871 Hypo-osmolality and hyponatremia: Secondary | ICD-10-CM | POA: Diagnosis present

## 2023-09-25 DIAGNOSIS — F1123 Opioid dependence with withdrawal: Secondary | ICD-10-CM | POA: Diagnosis not present

## 2023-09-25 DIAGNOSIS — E43 Unspecified severe protein-calorie malnutrition: Secondary | ICD-10-CM | POA: Diagnosis present

## 2023-09-25 DIAGNOSIS — R0603 Acute respiratory distress: Secondary | ICD-10-CM

## 2023-09-25 DIAGNOSIS — J181 Lobar pneumonia, unspecified organism: Secondary | ICD-10-CM

## 2023-09-25 DIAGNOSIS — F4310 Post-traumatic stress disorder, unspecified: Secondary | ICD-10-CM | POA: Diagnosis present

## 2023-09-25 DIAGNOSIS — R7989 Other specified abnormal findings of blood chemistry: Principal | ICD-10-CM | POA: Diagnosis present

## 2023-09-25 DIAGNOSIS — G4700 Insomnia, unspecified: Secondary | ICD-10-CM | POA: Diagnosis present

## 2023-09-25 DIAGNOSIS — Z880 Allergy status to penicillin: Secondary | ICD-10-CM

## 2023-09-25 DIAGNOSIS — D649 Anemia, unspecified: Secondary | ICD-10-CM | POA: Diagnosis present

## 2023-09-25 DIAGNOSIS — Z888 Allergy status to other drugs, medicaments and biological substances status: Secondary | ICD-10-CM

## 2023-09-25 DIAGNOSIS — F1410 Cocaine abuse, uncomplicated: Secondary | ICD-10-CM | POA: Diagnosis present

## 2023-09-25 DIAGNOSIS — E8770 Fluid overload, unspecified: Secondary | ICD-10-CM | POA: Diagnosis present

## 2023-09-25 DIAGNOSIS — Z886 Allergy status to analgesic agent status: Secondary | ICD-10-CM

## 2023-09-25 DIAGNOSIS — D6959 Other secondary thrombocytopenia: Secondary | ICD-10-CM | POA: Diagnosis present

## 2023-09-25 DIAGNOSIS — Z8661 Personal history of infections of the central nervous system: Secondary | ICD-10-CM

## 2023-09-25 DIAGNOSIS — F17210 Nicotine dependence, cigarettes, uncomplicated: Secondary | ICD-10-CM | POA: Diagnosis present

## 2023-09-25 DIAGNOSIS — Z8249 Family history of ischemic heart disease and other diseases of the circulatory system: Secondary | ICD-10-CM

## 2023-09-25 DIAGNOSIS — J80 Acute respiratory distress syndrome: Secondary | ICD-10-CM | POA: Diagnosis present

## 2023-09-25 DIAGNOSIS — D696 Thrombocytopenia, unspecified: Secondary | ICD-10-CM | POA: Diagnosis present

## 2023-09-25 DIAGNOSIS — Z803 Family history of malignant neoplasm of breast: Secondary | ICD-10-CM

## 2023-09-25 DIAGNOSIS — K7460 Unspecified cirrhosis of liver: Secondary | ICD-10-CM | POA: Diagnosis present

## 2023-09-25 DIAGNOSIS — F332 Major depressive disorder, recurrent severe without psychotic features: Secondary | ICD-10-CM | POA: Diagnosis present

## 2023-09-25 DIAGNOSIS — I10 Essential (primary) hypertension: Secondary | ICD-10-CM | POA: Diagnosis present

## 2023-09-25 DIAGNOSIS — Z8739 Personal history of other diseases of the musculoskeletal system and connective tissue: Secondary | ICD-10-CM

## 2023-09-25 DIAGNOSIS — L03115 Cellulitis of right lower limb: Secondary | ICD-10-CM | POA: Diagnosis present

## 2023-09-25 DIAGNOSIS — Z765 Malingerer [conscious simulation]: Secondary | ICD-10-CM

## 2023-09-25 DIAGNOSIS — G9341 Metabolic encephalopathy: Secondary | ICD-10-CM | POA: Diagnosis not present

## 2023-09-25 DIAGNOSIS — F419 Anxiety disorder, unspecified: Secondary | ICD-10-CM | POA: Diagnosis present

## 2023-09-25 DIAGNOSIS — Z79899 Other long term (current) drug therapy: Secondary | ICD-10-CM

## 2023-09-25 DIAGNOSIS — R1011 Right upper quadrant pain: Secondary | ICD-10-CM | POA: Diagnosis present

## 2023-09-25 DIAGNOSIS — R799 Abnormal finding of blood chemistry, unspecified: Secondary | ICD-10-CM

## 2023-09-25 DIAGNOSIS — J9601 Acute respiratory failure with hypoxia: Principal | ICD-10-CM

## 2023-09-25 DIAGNOSIS — B999 Unspecified infectious disease: Secondary | ICD-10-CM

## 2023-09-25 DIAGNOSIS — K760 Fatty (change of) liver, not elsewhere classified: Secondary | ICD-10-CM | POA: Diagnosis present

## 2023-09-25 DIAGNOSIS — L03116 Cellulitis of left lower limb: Secondary | ICD-10-CM | POA: Diagnosis present

## 2023-09-25 DIAGNOSIS — Z6841 Body Mass Index (BMI) 40.0 and over, adult: Secondary | ICD-10-CM

## 2023-09-25 HISTORY — DX: Retention of urine, unspecified: R33.9

## 2023-09-25 HISTORY — DX: Septic arterial embolism: I76

## 2023-09-25 LAB — COMPREHENSIVE METABOLIC PANEL WITH GFR
ALT: 66 U/L — ABNORMAL HIGH (ref 0–44)
AST: 177 U/L — ABNORMAL HIGH (ref 15–41)
Albumin: 2.3 g/dL — ABNORMAL LOW (ref 3.5–5.0)
Alkaline Phosphatase: 1331 U/L — ABNORMAL HIGH (ref 38–126)
Anion gap: 9 (ref 5–15)
BUN: 23 mg/dL — ABNORMAL HIGH (ref 6–20)
CO2: 21 mmol/L — ABNORMAL LOW (ref 22–32)
Calcium: 8.3 mg/dL — ABNORMAL LOW (ref 8.9–10.3)
Chloride: 105 mmol/L (ref 98–111)
Creatinine, Ser: 0.83 mg/dL (ref 0.44–1.00)
GFR, Estimated: >60 mL/min (ref >60)
Glucose, Bld: 93 mg/dL (ref 70–99)
Potassium: 5.1 mmol/L (ref 3.5–5.1)
Sodium: 135 mmol/L (ref 135–145)
Total Bilirubin: 2.7 mg/dL — ABNORMAL HIGH (ref 0.0–1.2)
Total Protein: 7.2 g/dL (ref 6.5–8.1)

## 2023-09-25 LAB — URINALYSIS, W/ REFLEX TO CULTURE (INFECTION SUSPECTED)
Bilirubin Urine: NEGATIVE
Glucose, UA: NEGATIVE mg/dL
Ketones, ur: NEGATIVE mg/dL
Nitrite: NEGATIVE
Protein, ur: 30 mg/dL — AB
Specific Gravity, Urine: 1.019 (ref 1.005–1.030)
pH: 6 (ref 5.0–8.0)

## 2023-09-25 LAB — I-STAT CG4 LACTIC ACID, ED: Lactic Acid, Venous: 1.9 mmol/L (ref 0.5–1.9)

## 2023-09-25 MED ORDER — VANCOMYCIN HCL IN DEXTROSE 1-5 GM/200ML-% IV SOLN
1000.0000 mg | Freq: Once | INTRAVENOUS | Status: AC
  Administered 2023-09-26: 1000 mg via INTRAVENOUS
  Filled 2023-09-25: qty 200

## 2023-09-25 MED ORDER — MORPHINE SULFATE (PF) 4 MG/ML IV SOLN
6.0000 mg | Freq: Once | INTRAVENOUS | Status: AC
  Administered 2023-09-25: 6 mg via INTRAVENOUS
  Filled 2023-09-25: qty 2

## 2023-09-25 MED ORDER — SODIUM CHLORIDE 0.9 % IV SOLN
2.0000 g | Freq: Once | INTRAVENOUS | Status: AC
  Administered 2023-09-26: 2 g via INTRAVENOUS
  Filled 2023-09-25: qty 12.5

## 2023-09-25 NOTE — ED Provider Notes (Signed)
  EMERGENCY DEPARTMENT AT [facility] Provider Note   CSN: [id number] Arrival date & time: 09/25/23  2031     Patient presents with: Fever and Leg Swelling   Falan Hensler is a 43 y.o. female.   43 year old female presents with worsening bilateral lower extremity swelling and pain times several days.  Has a history of necrotizing fasciitis to her left lower extremity.  This was about 4 years ago.  States that she is been dealing with chronic drainage from old wounds.  Notes new erythema to her right lower extremity.  States that she does smoke cigarettes and has been more short of breath and had a cough.  Fever at home up to 103 treated with Tylenol  and ibuprofen.  Denies any orthopnea but does note some dyspnea exertion.  Denies any anginal type chest pain.       Prior to Admission medications   Medication Sig Start Date End Date Taking? Authorizing Provider  busPIRone  (BUSPAR ) 10 MG tablet Take 1 tablet (10 mg total) by mouth every 8 (eight) hours as needed (anxiety). 11/01/19   Cheryle Page, MD  celecoxib  (CELEBREX ) 200 MG capsule Take 1 capsule (200 mg total) by mouth 2 (two) times daily. 12/08/19   Persons, Ronal Dragon, PA  doxycycline  (VIBRA -TABS) 100 MG tablet Take 1 tablet (100 mg total) by mouth 2 (two) times daily. 12/08/19   Persons, Ronal Dragon, PA  gabapentin  (NEURONTIN ) 300 MG capsule Take 1 capsule (300 mg total) by mouth 3 (three) times daily. 12/08/19   Persons, Ronal Dragon, PA  HYDROcodone -acetaminophen  (NORCO/VICODIN) 5-325 MG tablet Take 1 tablet by mouth every 6 (six) hours as needed for moderate pain. 12/08/19   Persons, Ronal Dragon, PA  polyethylene glycol (MIRALAX  / GLYCOLAX ) 17 g packet Take 17 g by mouth daily as needed for mild constipation. 11/01/19   Cheryle Page, MD  senna-docusate (SENOKOT-S) 8.6-50 MG tablet Take 1 tablet by mouth 2 (two) times daily. 11/01/19   Cheryle Page, MD  silver  sulfADIAZINE  (SILVADENE ) 1 % cream Apply 1  application topically daily. 12/08/19   Persons, Ronal Dragon, [place]    Allergies: Epidural tray 17gx3-1-2 [nerve block tray], Ibuprofen, Penicillins, and Zofran  [ondansetron  hcl]    Review of Systems  All other systems reviewed and are negative.   Updated Vital Signs BP (!) 176/116   Pulse (!) 132   Temp 98.5 F (36.9 C)   Resp 19   LMP 08/10/2011   SpO2 100%   Physical Exam Vitals and nursing note reviewed.  Constitutional:      General: She is not in acute distress.    Appearance: Normal appearance. She is well-developed. She is not toxic-appearing.  HENT:     Head: Normocephalic and atraumatic.  Eyes:     General: Lids are normal.     Conjunctiva/sclera: Conjunctivae normal.     Pupils: Pupils are equal, round, and reactive to light.  Neck:     Thyroid: No thyroid mass.     Trachea: No tracheal deviation.  Cardiovascular:     Rate and Rhythm: Normal rate and regular rhythm.     Heart sounds: Normal heart sounds. No murmur heard.    No gallop.  Pulmonary:     Effort: Pulmonary effort is normal. No respiratory distress.     Breath sounds: Normal breath sounds. No stridor. No decreased breath sounds, wheezing, rhonchi or rales.  Abdominal:     General: There is no distension.     Palpations: Abdomen is  soft.     Tenderness: There is no abdominal tenderness. There is no rebound.     Comments: Edema appreciated lower abdominal wall  Musculoskeletal:        General: No tenderness. Normal range of motion.     Cervical back: Normal range of motion and neck supple.       Legs:     Comments: Edema noted to bilateral lower extremity without crepitus.  Skin:    General: Skin is warm and dry.     Findings: No abrasion or rash.  Neurological:     Mental Status: She is alert and oriented to person, place, and time. Mental status is at baseline.     GCS: GCS eye subscore is 4. GCS verbal subscore is 5. GCS motor subscore is 6.     Cranial Nerves: No cranial nerve deficit.      Sensory: No sensory deficit.     Motor: Motor function is intact.  Psychiatric:        Attention and Perception: Attention normal.        Speech: Speech normal.        Behavior: Behavior normal.     (all labs ordered are listed, but only abnormal results are displayed) Labs Reviewed  RESP PANEL BY RT-PCR (RSV, FLU A&B, COVID)  RVPGX2  CULTURE, BLOOD (ROUTINE X 2)  CULTURE, BLOOD (ROUTINE X 2)  COMPREHENSIVE METABOLIC PANEL WITH GFR  CBC WITH DIFFERENTIAL/PLATELET  URINALYSIS, W/ REFLEX TO CULTURE (INFECTION SUSPECTED)  I-STAT CG4 LACTIC ACID, ED    EKG: None  Radiology: No results found.   Procedures   Medications Ordered in the ED  morphine  (PF) 4 MG/ML injection 6 mg (has no administration in time range)                                    Medical Decision Making Amount and/or Complexity of Data Reviewed Labs: ordered. Radiology: ordered.  Risk Prescription drug management.  Chest x-ray without acute findings at this time. Patient medicated for pain and does feel better.  Patient CBC is pending but her CMET is significant for elevated liver function test.  Patient endorses some increasing abdominal distention and now notes that she has had some pain on her right side.  Abdominal CT has been ordered.  Will also order CT scan of patient's left lower extremity.  Patient's plain x-rays of her lower extremity did not show any air but showed findings concerning for cellulitis on the left side.  Patient.  We started on IV antibiotics.  Urinalysis is noted for infection. Care turned over to Dr. Trine    Final diagnoses:  None    ED Discharge Orders     None          Dasie Faden, MD 09/25/23 2356

## 2023-09-25 NOTE — ED Triage Notes (Signed)
 Patient c/o fever and bilateral leg pain x 5 days. Patient report worsening bilateral leg pain and swelling tonight. Patient report taking tylenol  2 hours ago. Patient denies N/V/D. Patient denies Chest pain and SOB.

## 2023-09-25 NOTE — ED Notes (Signed)
 99.1 was her rectal emp

## 2023-09-26 ENCOUNTER — Inpatient Hospital Stay (HOSPITAL_COMMUNITY)

## 2023-09-26 ENCOUNTER — Emergency Department (HOSPITAL_COMMUNITY)

## 2023-09-26 ENCOUNTER — Encounter (HOSPITAL_COMMUNITY): Payer: Self-pay | Admitting: Family Medicine

## 2023-09-26 DIAGNOSIS — D6959 Other secondary thrombocytopenia: Secondary | ICD-10-CM | POA: Diagnosis present

## 2023-09-26 DIAGNOSIS — F1120 Opioid dependence, uncomplicated: Secondary | ICD-10-CM | POA: Diagnosis not present

## 2023-09-26 DIAGNOSIS — I38 Endocarditis, valve unspecified: Secondary | ICD-10-CM | POA: Diagnosis not present

## 2023-09-26 DIAGNOSIS — L03116 Cellulitis of left lower limb: Secondary | ICD-10-CM | POA: Diagnosis present

## 2023-09-26 DIAGNOSIS — E871 Hypo-osmolality and hyponatremia: Secondary | ICD-10-CM | POA: Diagnosis present

## 2023-09-26 DIAGNOSIS — E43 Unspecified severe protein-calorie malnutrition: Secondary | ICD-10-CM | POA: Diagnosis present

## 2023-09-26 DIAGNOSIS — L03115 Cellulitis of right lower limb: Secondary | ICD-10-CM | POA: Diagnosis present

## 2023-09-26 DIAGNOSIS — F411 Generalized anxiety disorder: Secondary | ICD-10-CM | POA: Diagnosis not present

## 2023-09-26 DIAGNOSIS — Z79899 Other long term (current) drug therapy: Secondary | ICD-10-CM | POA: Diagnosis not present

## 2023-09-26 DIAGNOSIS — Z765 Malingerer [conscious simulation]: Secondary | ICD-10-CM | POA: Diagnosis not present

## 2023-09-26 DIAGNOSIS — F17210 Nicotine dependence, cigarettes, uncomplicated: Secondary | ICD-10-CM | POA: Diagnosis present

## 2023-09-26 DIAGNOSIS — R7989 Other specified abnormal findings of blood chemistry: Principal | ICD-10-CM | POA: Diagnosis present

## 2023-09-26 DIAGNOSIS — Z6841 Body Mass Index (BMI) 40.0 and over, adult: Secondary | ICD-10-CM | POA: Diagnosis not present

## 2023-09-26 DIAGNOSIS — G9341 Metabolic encephalopathy: Secondary | ICD-10-CM | POA: Diagnosis not present

## 2023-09-26 DIAGNOSIS — F419 Anxiety disorder, unspecified: Secondary | ICD-10-CM | POA: Diagnosis present

## 2023-09-26 DIAGNOSIS — M7989 Other specified soft tissue disorders: Secondary | ICD-10-CM | POA: Diagnosis not present

## 2023-09-26 DIAGNOSIS — F329 Major depressive disorder, single episode, unspecified: Secondary | ICD-10-CM | POA: Diagnosis not present

## 2023-09-26 DIAGNOSIS — F1123 Opioid dependence with withdrawal: Secondary | ICD-10-CM | POA: Diagnosis not present

## 2023-09-26 DIAGNOSIS — J9601 Acute respiratory failure with hypoxia: Secondary | ICD-10-CM | POA: Diagnosis not present

## 2023-09-26 DIAGNOSIS — F332 Major depressive disorder, recurrent severe without psychotic features: Secondary | ICD-10-CM | POA: Diagnosis present

## 2023-09-26 DIAGNOSIS — D696 Thrombocytopenia, unspecified: Secondary | ICD-10-CM | POA: Diagnosis present

## 2023-09-26 DIAGNOSIS — K760 Fatty (change of) liver, not elsewhere classified: Secondary | ICD-10-CM | POA: Diagnosis present

## 2023-09-26 DIAGNOSIS — A419 Sepsis, unspecified organism: Secondary | ICD-10-CM | POA: Diagnosis present

## 2023-09-26 DIAGNOSIS — D649 Anemia, unspecified: Secondary | ICD-10-CM | POA: Diagnosis present

## 2023-09-26 DIAGNOSIS — B20 Human immunodeficiency virus [HIV] disease: Secondary | ICD-10-CM | POA: Diagnosis not present

## 2023-09-26 DIAGNOSIS — K7460 Unspecified cirrhosis of liver: Secondary | ICD-10-CM | POA: Diagnosis present

## 2023-09-26 DIAGNOSIS — J189 Pneumonia, unspecified organism: Secondary | ICD-10-CM | POA: Diagnosis present

## 2023-09-26 DIAGNOSIS — I10 Essential (primary) hypertension: Secondary | ICD-10-CM | POA: Diagnosis present

## 2023-09-26 DIAGNOSIS — G4700 Insomnia, unspecified: Secondary | ICD-10-CM | POA: Diagnosis present

## 2023-09-26 DIAGNOSIS — R451 Restlessness and agitation: Secondary | ICD-10-CM | POA: Diagnosis not present

## 2023-09-26 DIAGNOSIS — Z1152 Encounter for screening for COVID-19: Secondary | ICD-10-CM | POA: Diagnosis not present

## 2023-09-26 DIAGNOSIS — F1910 Other psychoactive substance abuse, uncomplicated: Secondary | ICD-10-CM | POA: Diagnosis not present

## 2023-09-26 DIAGNOSIS — R945 Abnormal results of liver function studies: Secondary | ICD-10-CM | POA: Diagnosis not present

## 2023-09-26 DIAGNOSIS — R509 Fever, unspecified: Secondary | ICD-10-CM | POA: Diagnosis present

## 2023-09-26 DIAGNOSIS — R1011 Right upper quadrant pain: Secondary | ICD-10-CM | POA: Diagnosis not present

## 2023-09-26 DIAGNOSIS — J80 Acute respiratory distress syndrome: Secondary | ICD-10-CM | POA: Diagnosis present

## 2023-09-26 DIAGNOSIS — R7401 Elevation of levels of liver transaminase levels: Secondary | ICD-10-CM | POA: Diagnosis not present

## 2023-09-26 DIAGNOSIS — F1410 Cocaine abuse, uncomplicated: Secondary | ICD-10-CM | POA: Diagnosis present

## 2023-09-26 DIAGNOSIS — Z8249 Family history of ischemic heart disease and other diseases of the circulatory system: Secondary | ICD-10-CM | POA: Diagnosis not present

## 2023-09-26 DIAGNOSIS — R0902 Hypoxemia: Secondary | ICD-10-CM | POA: Diagnosis not present

## 2023-09-26 LAB — PROTIME-INR
INR: 1 (ref 0.8–1.2)
Prothrombin Time: 14.1 s (ref 11.4–15.2)

## 2023-09-26 LAB — COMPREHENSIVE METABOLIC PANEL WITH GFR
ALT: 49 U/L — ABNORMAL HIGH (ref 0–44)
AST: 118 U/L — ABNORMAL HIGH (ref 15–41)
Albumin: 2 g/dL — ABNORMAL LOW (ref 3.5–5.0)
Alkaline Phosphatase: 1061 U/L — ABNORMAL HIGH (ref 38–126)
Anion gap: 7 (ref 5–15)
BUN: 22 mg/dL — ABNORMAL HIGH (ref 6–20)
CO2: 21 mmol/L — ABNORMAL LOW (ref 22–32)
Calcium: 7.6 mg/dL — ABNORMAL LOW (ref 8.9–10.3)
Chloride: 106 mmol/L (ref 98–111)
Creatinine, Ser: 0.84 mg/dL (ref 0.44–1.00)
GFR, Estimated: >60 mL/min (ref >60)
Glucose, Bld: 86 mg/dL (ref 70–99)
Potassium: 4.6 mmol/L (ref 3.5–5.1)
Sodium: 134 mmol/L — ABNORMAL LOW (ref 135–145)
Total Bilirubin: 2.7 mg/dL — ABNORMAL HIGH (ref 0.0–1.2)
Total Protein: 6.3 g/dL — ABNORMAL LOW (ref 6.5–8.1)

## 2023-09-26 LAB — CBC WITH DIFFERENTIAL/PLATELET
Abs Immature Granulocytes: 0.09 K/uL — ABNORMAL HIGH (ref 0.00–0.07)
Basophils Absolute: 0 K/uL (ref 0.0–0.1)
Basophils Relative: 0 %
Eosinophils Absolute: 0 K/uL (ref 0.0–0.5)
Eosinophils Relative: 0 %
HCT: 28 % — ABNORMAL LOW (ref 36.0–46.0)
Hemoglobin: 8.8 g/dL — ABNORMAL LOW (ref 12.0–15.0)
Immature Granulocytes: 2 %
Lymphocytes Relative: 29 %
Lymphs Abs: 1.4 K/uL (ref 0.7–4.0)
MCH: 25.5 pg — ABNORMAL LOW (ref 26.0–34.0)
MCHC: 31.4 g/dL (ref 30.0–36.0)
MCV: 81.2 fL (ref 80.0–100.0)
Monocytes Absolute: 0.3 K/uL (ref 0.1–1.0)
Monocytes Relative: 5 %
Neutro Abs: 3.1 K/uL (ref 1.7–7.7)
Neutrophils Relative %: 64 %
Platelets: 145 K/uL — ABNORMAL LOW (ref 150–400)
RBC: 3.45 MIL/uL — ABNORMAL LOW (ref 3.87–5.11)
RDW: 19.4 % — ABNORMAL HIGH (ref 11.5–15.5)
WBC: 4.9 K/uL (ref 4.0–10.5)
nRBC: 0 % (ref 0.0–0.2)

## 2023-09-26 LAB — BLOOD CULTURE ID PANEL (REFLEXED) - BCID2

## 2023-09-26 LAB — URINALYSIS, ROUTINE W REFLEX MICROSCOPIC
Bacteria, UA: NONE SEEN
Bilirubin Urine: NEGATIVE
Glucose, UA: NEGATIVE mg/dL
Ketones, ur: NEGATIVE mg/dL
Leukocytes,Ua: NEGATIVE
Nitrite: POSITIVE — AB
Protein, ur: 30 mg/dL — AB
Specific Gravity, Urine: >1.046 — ABNORMAL HIGH (ref 1.005–1.030)
pH: 5 (ref 5.0–8.0)

## 2023-09-26 LAB — CBC
HCT: 33.2 % — ABNORMAL LOW (ref 36.0–46.0)
Hemoglobin: 10.1 g/dL — ABNORMAL LOW (ref 12.0–15.0)
MCH: 25.2 pg — ABNORMAL LOW (ref 26.0–34.0)
MCHC: 30.4 g/dL (ref 30.0–36.0)
MCV: 82.8 fL (ref 80.0–100.0)
Platelets: 137 K/uL — ABNORMAL LOW (ref 150–400)
RBC: 4.01 MIL/uL (ref 3.87–5.11)
RDW: 20.2 % — ABNORMAL HIGH (ref 11.5–15.5)
WBC: 5.3 K/uL (ref 4.0–10.5)
nRBC: 0 % (ref 0.0–0.2)

## 2023-09-26 LAB — URINE DRUG SCREEN
Amphetamines: POSITIVE — AB
Barbiturates: NEGATIVE
Benzodiazepines: NEGATIVE
Cocaine: NEGATIVE
Fentanyl: POSITIVE — AB
Methadone Scn, Ur: NEGATIVE
Opiates: POSITIVE — AB
Tetrahydrocannabinol: NEGATIVE

## 2023-09-26 LAB — MAGNESIUM: Magnesium: 2.2 mg/dL (ref 1.7–2.4)

## 2023-09-26 LAB — PHOSPHORUS: Phosphorus: 3 mg/dL (ref 2.5–4.6)

## 2023-09-26 LAB — HIV ANTIBODY (ROUTINE TESTING W REFLEX): HIV Screen 4th Generation wRfx: REACTIVE — AB

## 2023-09-26 LAB — ETHANOL: Alcohol, Ethyl (B): <15 mg/dL (ref <15)

## 2023-09-26 LAB — RESP PANEL BY RT-PCR (RSV, FLU A&B, COVID)  RVPGX2
Influenza A by PCR: NEGATIVE
Influenza B by PCR: NEGATIVE
Resp Syncytial Virus by PCR: NEGATIVE
SARS Coronavirus 2 by RT PCR: NEGATIVE

## 2023-09-26 LAB — ACETAMINOPHEN LEVEL: Acetaminophen (Tylenol), Serum: <10 ug/mL — ABNORMAL LOW (ref 10–30)

## 2023-09-26 LAB — CBG MONITORING, ED: Glucose-Capillary: 84 mg/dL (ref 70–99)

## 2023-09-26 MED ORDER — KETOROLAC TROMETHAMINE 15 MG/ML IJ SOLN
15.0000 mg | Freq: Once | INTRAMUSCULAR | Status: AC
  Administered 2023-09-26: 15 mg via INTRAVENOUS
  Filled 2023-09-26: qty 1

## 2023-09-26 MED ORDER — SODIUM CHLORIDE 0.9 % IV SOLN
2.0000 g | INTRAVENOUS | Status: DC
  Administered 2023-09-26 – 2023-09-29 (×4): 2 g via INTRAVENOUS
  Filled 2023-09-26 (×4): qty 20

## 2023-09-26 MED ORDER — IOHEXOL 350 MG/ML SOLN
75.0000 mL | Freq: Once | INTRAVENOUS | Status: AC | PRN
  Administered 2023-09-26: 75 mL via INTRAVENOUS

## 2023-09-26 MED ORDER — NICOTINE 21 MG/24HR TD PT24
21.0000 mg | MEDICATED_PATCH | Freq: Every day | TRANSDERMAL | Status: DC | PRN
  Administered 2023-09-26 – 2023-10-04 (×5): 21 mg via TRANSDERMAL
  Filled 2023-09-26 (×6): qty 1

## 2023-09-26 MED ORDER — ALBUTEROL SULFATE (2.5 MG/3ML) 0.083% IN NEBU
2.5000 mg | INHALATION_SOLUTION | RESPIRATORY_TRACT | Status: DC | PRN
  Administered 2023-09-26 – 2023-09-30 (×6): 2.5 mg via RESPIRATORY_TRACT
  Filled 2023-09-26 (×8): qty 3

## 2023-09-26 MED ORDER — OXYCODONE HCL 5 MG PO TABS
5.0000 mg | ORAL_TABLET | Freq: Once | ORAL | Status: AC
  Administered 2023-09-26: 5 mg via ORAL
  Filled 2023-09-26: qty 1

## 2023-09-26 MED ORDER — VANCOMYCIN HCL 1250 MG/250ML IV SOLN
1250.0000 mg | INTRAVENOUS | Status: DC
  Filled 2023-09-26: qty 250

## 2023-09-26 MED ORDER — OXYCODONE HCL 5 MG PO TABS
5.0000 mg | ORAL_TABLET | Freq: Four times a day (QID) | ORAL | Status: DC | PRN
  Administered 2023-09-26 – 2023-09-27 (×2): 5 mg via ORAL
  Filled 2023-09-26 (×2): qty 1

## 2023-09-26 MED ORDER — SODIUM CHLORIDE 0.9 % IV BOLUS
1000.0000 mL | Freq: Once | INTRAVENOUS | Status: AC
  Administered 2023-09-26: 1000 mL via INTRAVENOUS

## 2023-09-26 MED ORDER — ACETAMINOPHEN 325 MG PO TABS
650.0000 mg | ORAL_TABLET | Freq: Once | ORAL | Status: AC
  Administered 2023-09-26: 650 mg via ORAL
  Filled 2023-09-26: qty 2

## 2023-09-26 MED ORDER — VANCOMYCIN HCL IN DEXTROSE 1-5 GM/200ML-% IV SOLN
1000.0000 mg | Freq: Once | INTRAVENOUS | Status: AC
  Administered 2023-09-26: 1000 mg via INTRAVENOUS
  Filled 2023-09-26: qty 200

## 2023-09-26 MED ORDER — PROCHLORPERAZINE EDISYLATE 10 MG/2ML IJ SOLN
10.0000 mg | Freq: Four times a day (QID) | INTRAMUSCULAR | Status: DC | PRN
  Administered 2023-09-29 – 2023-10-06 (×13): 10 mg via INTRAVENOUS
  Filled 2023-09-26 (×15): qty 2

## 2023-09-26 MED ORDER — METRONIDAZOLE 500 MG/100ML IV SOLN
500.0000 mg | Freq: Two times a day (BID) | INTRAVENOUS | Status: DC
  Administered 2023-09-26 – 2023-09-29 (×8): 500 mg via INTRAVENOUS
  Filled 2023-09-26 (×8): qty 100

## 2023-09-26 MED ORDER — HYDROMORPHONE HCL 1 MG/ML IJ SOLN
0.5000 mg | INTRAMUSCULAR | Status: DC | PRN
  Administered 2023-09-26 – 2023-09-28 (×17): 0.5 mg via INTRAVENOUS
  Filled 2023-09-26 (×17): qty 0.5

## 2023-09-26 MED ORDER — IOHEXOL 300 MG/ML  SOLN
100.0000 mL | Freq: Once | INTRAMUSCULAR | Status: AC | PRN
  Administered 2023-09-26: 100 mL via INTRAVENOUS

## 2023-09-26 MED ORDER — HYDROMORPHONE HCL 1 MG/ML IJ SOLN
0.5000 mg | INTRAMUSCULAR | Status: DC | PRN
  Administered 2023-09-26: 0.5 mg via INTRAVENOUS
  Filled 2023-09-26 (×3): qty 1

## 2023-09-26 MED ORDER — INFLUENZA VIRUS VACC SPLIT PF (FLUZONE) 0.5 ML IM SUSY
0.5000 mL | PREFILLED_SYRINGE | INTRAMUSCULAR | Status: DC
  Filled 2023-09-26: qty 0.5

## 2023-09-26 MED ORDER — MORPHINE SULFATE (PF) 4 MG/ML IV SOLN
4.0000 mg | INTRAVENOUS | Status: DC | PRN
  Administered 2023-09-26: 4 mg via INTRAVENOUS
  Filled 2023-09-26: qty 1

## 2023-09-26 MED ORDER — HYDROXYZINE HCL 25 MG PO TABS
25.0000 mg | ORAL_TABLET | Freq: Four times a day (QID) | ORAL | Status: AC | PRN
  Administered 2023-09-26 – 2023-09-27 (×2): 25 mg via ORAL
  Filled 2023-09-26 (×2): qty 1

## 2023-09-26 MED ORDER — ORAL CARE MOUTH RINSE: 15.0000 mL | OROMUCOSAL | Status: DC | PRN

## 2023-09-26 NOTE — Progress Notes (Signed)
 Pt with increased SOB, O2 saturations at 91% on 3L, increased O2 to 4L to maintain saturations at 92-93%. RRT called to bedside for breathing treatment. Provider made aware.

## 2023-09-26 NOTE — Consult Note (Addendum)
 WOC Nurse Consult Note: patient with history of necrotizing fascitis status post debridement by Dr. Harden 10/2019 with grafting, last seen in their office 2021; presents with chronic non healing area to left leg  Reason for Consult: L leg wound  Wound type: full thickness L leg r/t nonhealing wound with graft as above  Pressure Injury POA: NA not pressure  Measurement: see nursing flowsheet  Wound bed: 50% red 50% tan; some dry scabbed tissue at superior aspect of scar; does appear to have one staple still present;  per bedside nurse there are 3 staples that are partially buried under intact skin. R leg with erythema, no open wound per bedside nurse  Drainage (amount, consistency, odor) see nursing flowsheet  Periwound: scar tissue  Dressing procedure/placement/frequency: Cleanse L leg wound with Vashe wound cleanser, do not rinse and allow to air dry. Apply silver  hydrofiber ([employer]qu[employer] Lawson #[id number]) to any open wound beds daily and secure with silicone foam or Kerlix roll gauze whichever patient prefers.   POC discussed with bedside nurse. WOC team will not follow. Patient would benefit from returning to Dr. Trude office for ongoing management.    Thank you,    Powell Bar MSN, RN-BC, [employer]

## 2023-09-26 NOTE — Progress Notes (Signed)
 RT called to bedside for Rapid. RT had given a breathing tx prior.

## 2023-09-26 NOTE — Progress Notes (Signed)
 Pt with increased SOB with ambulation to bathroom. Desaturated with 3L [place] to 88%. Pt recovered to 92% with 3L of O2 once laying in bed. MD made aware.

## 2023-09-26 NOTE — H&P (Signed)
 History and Physical    Patient: [name] [name] FMW:[id number] DOB: 06/23/1980 DOA: 09/25/2023 DOS: the patient was seen and examined on 09/26/2023 PCP: Pcp, No  Patient coming from: Home  Chief Complaint:  Chief Complaint  Patient presents with   Fever   Leg Swelling   HPI: [name] [name] is a 43 y.o. female with medical history significant of anxiety, depression, chronic back pain, history of cocaine abuse, history of alcohol  abuse, history of opioid use disorder, septic embolism, spinal epidural abscess with peristasis of the lower extremities, history of urinary retention, degenerative disc disease, endometriosis with chronic pain, GERD, migraine headaches, obesity who presented to the emergency department with fever and lower extremity swelling.  She has also been having right upper quadrant pain.  No nausea, emesis, diarrhea, constipation, melena or hematochezia.  Denied rhinorrhea, sore throat, wheezing or hemoptysis.  No chest pain, palpitations, diaphoresis, PND, orthopnea or pitting edema of the lower extremities.  No flank pain, dysuria, frequency or hematuria.  No polyuria, polydipsia, polyphagia or blurred vision.   ED course: Initial vital signs were temperature 98.5 F, pulse 132, respiration 19, BP 176/116 mmHg O2 sat 100% on room air.  The patient received acetaminophen  650 mg IVP, cefepime  2 g IVPB, ketorolac  15 mg IVP, morphine  6 mg IVP, vancomycin  1000 mg IVPB and 1000 mL of normal saline bolus.  Lab work:  Urinalysis showed small hemoglobin, trace leukocyte esterase, 11-20 WBC and many bacteria. Coronavirus, influenza and RSV PCR was negative.  CBC showed a white count of 4.9, hemoglobin 8.8 g/dL and platelets 854.  Normal lactic acid and acetaminophen  level.  CMP showed a CO2 of 21 mmol/L with a normal anion gap, the rest of the electrolytes were normal after calcium  correction.  Glucose 93, BUN 23, creatinine 0.83 and total bilirubin 2.7 mg/dL.  Total protein 7.2 and albumin  2.3  g/dL.  AST 177, ALT 66 alkaline phosphatase 1331 units/L.  Imaging: Portable 1 view chest radiograph with no active disease normal heart size and mediastinal contours.  Left tibia and fibula showing postsurgical changes from the skin graft procedure.  There are extensive subcutaneous edema throughout the visualized left lower leg, which could be postoperative or related to cellulitis.  No acute or destructive bony abnormality.  CT abdomen/pelvis with contrast showing nonspecific edema in the subcutaneous fat about the bilateral flanks, pannus, and tights right greater than left.  Correlate for cellulitis.  There is hepatic asteatosis.  Splenomegaly with hypoattenuating lesion in the posterior spleen, likely a benign cyst or hemangioma.  RUQ ultrasound equivocal for acute cholecystitis.  There is a positive sonographic Murphy sign with mild dilatation of the common bile duct.  However there is no cholelithiasis, gallbladder wall thickening, or pericholecystic fluid.  Consider HIDA scan for further evaluation.  CT left lower extremity with no acute bony abnormality.  No bone destruction to suggest osteomyelitis.  There is edema throughout the subcutaneous soft tissues in the left lower extremity from the distal thigh through the calf back to be postoperative or related to cellulitis.   Review of Systems: As mentioned in the history of present illness. All other systems reviewed and are negative. Past Medical History:  Diagnosis Date   Anxiety    klonipin for stress disorder   Chronic back pain    Cocaine abuse (HCC) 07/06/2013   Degenerative disc disease    Endometriosis    chronic pain   GERD (gastroesophageal reflux disease)    protonix  evenings   Migraine  Past Surgical History:  Procedure Laterality Date   ABDOMINAL HYSTERECTOMY     APPENDECTOMY     APPLICATION OF WOUND VAC Left 10/28/2019   Procedure: APPLICATION OF WOUND VAC;  Surgeon: Harden Jerona GAILS, MD;  Location: [facility] OR;  Service:  Orthopedics;  Laterality: Left;   I & D EXTREMITY Left 10/22/2019   Procedure: IRRIGATION AND DEBRIDEMENT EXTREMITY; APPLICATION OF WOUND VAC;  Surgeon: Beverley Evalene BIRCH, MD;  Location: [facility] OR;  Service: Orthopedics;  Laterality: Left;   I & D EXTREMITY Left 10/26/2019   Procedure: DEBRIDEMENT LEFT TIBIA;  Surgeon: Harden Jerona GAILS, MD;  Location[facility] OR;  Service: Orthopedics;  Laterality: Left;  left   I & D EXTREMITY Left 10/28/2019   Procedure: REPEAT DEBRIDEMENT LEFT TIBIA;  Surgeon: Harden Jerona GAILS, MD;  Location: [facility] OR;  Service: Orthopedics;  Laterality: Left;   IR THORACENTESIS ASP PLEURAL SPACE W/IMG GUIDE  10/28/2018   LAMINECTOMY FOR CEREBROSPINAL FLUID LEAK N/A 10/31/2018   Procedure: Thoracic Wound Exploration;  Surgeon: Gillie Duncans, MD;  Location: [facility] OR;  Service: Neurosurgery;  Laterality: N/A;   LAPAROSCOPIC ASSISTED VAGINAL HYSTERECTOMY  08/19/2011   Procedure: LAPAROSCOPIC ASSISTED VAGINAL HYSTERECTOMY;  Surgeon: Peggye Gull, MD;  Location: [facility] ORS;  Service: Gynecology;  Laterality: N/A;   TEE WITHOUT CARDIOVERSION N/A 10/25/2018   Procedure: TRANSESOPHAGEAL ECHOCARDIOGRAM (TEE);  Surgeon: Delford Maude BROCKS, MD;  Location: [facility] ENDOSCOPY;  Service: Cardiovascular;  Laterality: N/A;   THORACIC LAMINECTOMY FOR EPIDURAL ABSCESS N/A 10/20/2018   Procedure: THORACIC Eight, Thoracic nine LAMINECTOMY FOR EPIDURAL ABSCESS;  Surgeon: Joshua Alm RAMAN, MD;  Location: [facility] OR;  Service: Neurosurgery;  Laterality: N/A;   TONSILLECTOMY     TUBAL LIGATION     Social History:  reports that she has been smoking cigarettes. She has a 14 pack-year smoking history. She has never used smokeless tobacco. She reports current drug use. Drugs: Heroin and IV. She reports that she does not drink alcohol .  Allergies  Allergen Reactions   Epidural Tray 17gx3-1-2 [Nerve Block Tray] Other (See Comments)    Paralysis and severe pain in head/neck/shoulders.  No anaphylaxis.   Ibuprofen  Hives   Penicillins Hives      Tolerated Cephalosporin 10/22/2019.   Pt has tolerated cephalosporins on past admissions. Has patient had a PCN reaction causing immediate rash, facial/tongue/throat swelling, SOB or lightheadedness with hypotension: Yes Has patient had a PCN reaction causing severe rash involving mucus membranes or skin necrosis: No Has patient had a PCN reaction that required hospitalization No Has patient had a PCN reaction occurring within the last 10 years: No If all of the above answers are NO, then may   Zofran  [Ondansetron  Hcl] Nausea And Vomiting    Family History  Problem Relation Age of Onset   Coronary artery disease Father    Coronary artery disease Maternal Grandmother    Breast cancer Mother    Cancer Mother     Prior to Admission medications   Medication Sig Start Date End Date Taking? Authorizing Provider  busPIRone  (BUSPAR ) 10 MG tablet Take 1 tablet (10 mg total) by mouth every 8 (eight) hours as needed (anxiety). 11/01/19   Cheryle Page, MD  celecoxib  (CELEBREX ) 200 MG capsule Take 1 capsule (200 mg total) by mouth 2 (two) times daily. 12/08/19   Persons, Ronal Dragon, PA  doxycycline  (VIBRA -TABS) 100 MG tablet Take 1 tablet (100 mg total) by mouth 2 (two) times daily. 12/08/19   Persons, Ronal Dragon, PA  gabapentin  (NEURONTIN ) 300  MG capsule Take 1 capsule (300 mg total) by mouth 3 (three) times daily. 12/08/19   Persons, Ronal Dragon, PA  HYDROcodone -acetaminophen  (NORCO/VICODIN) 5-325 MG tablet Take 1 tablet by mouth every 6 (six) hours as needed for moderate pain. 12/08/19   Persons, Ronal Dragon, PA  polyethylene glycol (MIRALAX  / GLYCOLAX ) 17 g packet Take 17 g by mouth daily as needed for mild constipation. 11/01/19   Cheryle Page, MD  senna-docusate (SENOKOT-S) 8.6-50 MG tablet Take 1 tablet by mouth 2 (two) times daily. 11/01/19   Cheryle Page, MD  silver  sulfADIAZINE  (SILVADENE ) 1 % cream Apply 1 application topically daily. 12/08/19   Persons, Ronal Dragon, [place]    Physical  Exam: Vitals:   09/25/23 2315 09/25/23 2346 09/26/23 0300 09/26/23 0618  BP: (!) 152/128 132/88 (!) 142/92 136/69  Pulse: (!) 110 (!) 114 (!) 133 (!) 130  Resp: 19 18 17 20   Temp:   99.7 F (37.6 C) (!) 101.7 F (38.7 C)  TempSrc:   Oral Oral  SpO2: 94% 99% 92% 95%   Physical Exam Vitals and nursing note reviewed.  Constitutional:      General: She is awake. She is not in acute distress.    Appearance: She is obese. She is ill-appearing.  HENT:     Head: Normocephalic.     Nose: No rhinorrhea.     Mouth/Throat:     Mouth: Mucous membranes are dry.  Eyes:     General: No scleral icterus.    Pupils: Pupils are equal, round, and reactive to light.  Neck:     Vascular: No JVD.  Cardiovascular:     Rate and Rhythm: Normal rate and regular rhythm.     Heart sounds: S1 normal and S2 normal.  Pulmonary:     Effort: Pulmonary effort is normal.     Breath sounds: Normal breath sounds. No wheezing, rhonchi or rales.  Abdominal:     General: Bowel sounds are normal. There is no distension.     Palpations: Abdomen is soft.     Tenderness: There is abdominal tenderness. There is no right CVA tenderness or left CVA tenderness.  Musculoskeletal:     Cervical back: Neck supple.     Right lower leg: No edema.     Left lower leg: No edema.  Skin:    General: Skin is warm and dry.  Neurological:     General: No focal deficit present.     Mental Status: She is alert and oriented to person, place, and time.  Psychiatric:        Mood and Affect: Mood normal.        Behavior: Behavior normal. Behavior is cooperative.            Data Reviewed:  Results are pending, will review when available. 10/21/2018 transthoracic echocardiogram report. IMPRESSIONS:   1. Left ventricular ejection fraction, by visual estimation, is 60 to  65%. The left ventricle has normal function. Normal left ventricular size.  There is no left ventricular hypertrophy.   2. Global right ventricle has  normal systolic function.The right  ventricular size is normal. No increase in right ventricular wall  thickness.   3. Left atrial size was normal.   4. Right atrial size was normal.   5. The mitral valve is normal in structure. No evidence of mitral valve  regurgitation. No evidence of mitral stenosis.   6. The tricuspid valve is normal in structure. Tricuspid valve  regurgitation is mild.  7. The aortic valve was not well visualized Aortic valve regurgitation  was not visualized by color flow Doppler. Mild aortic valve sclerosis  without stenosis.   8. The pulmonic valve was normal in structure. Pulmonic valve  regurgitation is not visualized by color flow Doppler.   9. Mildly elevated pulmonary artery systolic pressure.  10. The inferior vena cava is normal in size with greater than 50%  respiratory variability, suggesting right atrial pressure of 3 mmHg.   EKG: Vent. rate 131 BPM PR interval * ms QRS duration 89 ms QT/QTcB 319/471 ms P-R-T axes * -11 15 Sinus tachycardia Low voltage, precordial lead  Assessment and Plan: Principal Problem:   Elevated LFTs Associated with:   RUQ pain Admit to PCU/inpatient. Continue IV fluids. Continue ceftriaxone  2 g IVPB every 24 hours. Continue metronidazole  500 mg IVPB q 12 hr. Analgesics and antiemetics as needed. Check acute hepatitis panel. Follow-up blood culture and sensitivity Follow CBC and CMP in a.m. Discussed with GI on-call. -Will get HIDA scan in AM. - -If negative will obtain MRCP. - -If positive will get general surgery.  Active Problems:   Cellulitis of both lower extremities Continue vancomycin  per pharmacy. Analgesics as needed.    MDD (major depressive disorder),  recurrent severe, without psychosis (HCC) Currently not on antidepressants.    Substance abuse (HCC) Check HIV test. Cessation advised.    Normocytic anemia Monitor hematocrit and hemoglobin.    Protein-calorie malnutrition, severe  (HCC) In the setting of liver disease and drug use. Might benefit from protein supplementation.    Hyponatremia Minimal. Might be due to malnutrition. Unknown clinical significance. Will recheck in AM.    Thrombocytopenia (HCC) In the setting of liver disease.    Advance Care Planning:   Code Status: Full Code   Consults:   Family Communication:   Severity of Illness: The appropriate patient status for this patient is INPATIENT. Inpatient status is judged to be reasonable and necessary in order to provide the required intensity of service to ensure the patient's safety. The patient's presenting symptoms, physical exam findings, and initial radiographic and laboratory data in the context of their chronic comorbidities is felt to place them at high risk for further clinical deterioration. Furthermore, it is not anticipated that the patient will be medically stable for discharge from the hospital within 2 midnights of admission.   * I certify that at the point of admission it is my clinical judgment that the patient will require inpatient hospital care spanning beyond 2 midnights from the point of admission due to high intensity of service, high risk for further deterioration and high frequency of surveillance required.*  Author: Alm Dorn Castor, MD 09/26/2023 8:16 AM  For on call review [email or web address].   This document was prepared using Dragon voice recognition software and may contain some unintended transcription errors.

## 2023-09-26 NOTE — Progress Notes (Signed)
 Pharmacy Antibiotic Note  [name] [name] is a 43 y.o. female admitted on 09/25/2023 with cellulitis.  Pharmacy has been consulted for vancomycin  dosing.  Plan: Vanc 1g x 1 already given. Give another 1g to = 2g total for loading dose. Then start 1250mg  IV q24 thereafter, goal AUC 400-550     Temp (24hrs), Avg:99.6 F (37.6 C), Min:98.5 F (36.9 C), Max:101.7 F (38.7 C)  Recent Labs  Lab 09/25/23 2228 09/25/23 2251 09/26/23 0000 09/26/23 0921  WBC  --   --  4.9  --   CREATININE  --  0.83  --  0.84  LATICACIDVEN 1.9  --   --   --     CrCl cannot be calculated (Unknown ideal weight.).    Allergies  Allergen Reactions   Epidural Tray 17gx3-1-2 [Nerve Block Tray] Other (See Comments)    Paralysis and severe pain in head/neck/shoulders.  No anaphylaxis.   Ibuprofen  Hives   Penicillins Hives     Tolerated Cephalosporin 10/22/2019.   Pt has tolerated cephalosporins on past admissions. Has patient had a PCN reaction causing immediate rash, facial/tongue/throat swelling, SOB or lightheadedness with hypotension: Yes Has patient had a PCN reaction causing severe rash involving mucus membranes or skin necrosis: No Has patient had a PCN reaction that required hospitalization No Has patient had a PCN reaction occurring within the last 10 years: No If all of the above answers are NO, then may   Zofran  [Ondansetron  Hcl] Nausea And Vomiting     Thank you for allowing pharmacy to be a part of this patient's care.  Britta Eva Na 09/26/2023 12:05 PM

## 2023-09-26 NOTE — ED Provider Notes (Signed)
 I assumed care of this patient from previous provider.  Please see their note for further details of history, exam, and MDM.   Briefly patient is a 42 y.o. female who presented with fever and peripheral edema.  Blood work thus far is notable for evidence concerning for biliary obstruction.  UA was suspicious for infection and patient was already given antibiotics.  Chest x-ray without evidence of pneumonia.  Currently awaiting CT abdomen and pelvis as well as CT of the left lower extremity given her prior history of necrotizing fasciitis in that leg.     CT scan of the abdomen notable for diffuse subcutaneous edema.  Questionable cellulitis but on exam there is no erythema concerning for it.  CT of the lower extremity notable for subcu edema.  No deep tissue infection and no evidence of necrotizing fasciitis.  Right upper quadrant ultrasound ordered which was equivocal for acute cholecystitis.  Will need to obtain a HIDA scan. Acetaminophen  level obtained and normal.  Consulted and spoke with hospitalist service who will admit patient for further workup and management.  .Critical Care  Performed by: Trine Raynell Moder, MD Authorized by: Trine Raynell Moder, MD   Critical care provider statement:    Critical care time (minutes):  45   Critical care time was exclusive of:  Separately billable procedures and treating other patients   Critical care was necessary to treat or prevent imminent or life-threatening deterioration of the following conditions:  Hepatic failure   Critical care was time spent personally by me on the following activities:  Development of treatment plan with patient or surrogate, discussions with consultants, evaluation of patient's response to treatment, examination of patient, obtaining history from patient or surrogate, review of old charts, re-evaluation of patient's condition, pulse oximetry, ordering and review of radiographic studies, ordering and review of  laboratory studies and ordering and performing treatments and interventions   Care discussed with: admitting provider       Trine Raynell Moder, MD 09/26/23 [phone]

## 2023-09-26 NOTE — Progress Notes (Signed)
 Rapid Response Event Note   Reason for Call : pt SOB    Initial Focused Assessment: Pt A/O and able to f/c.  Pt sitting up in bed and c/o pain in her right side when she breathes in and out.  Pt is on 4 L [place] was on (2L [place]) peior.  See flowsheet for VS.  Pt recently has received a breathing treatment (RT at bedside).  Pt slightly SOB, and seemed more relaxed as our conversation progressed.  Lung sounds decreased bil with pulses 2+.  Pt has generalized edema, with large soft abd.   Pt has received a CT Angio Chest earlier today.  Interventions: Spoke with [facility] , NP and made aware of pt current condition and will F/U.  Pt will need an IS at the bedside.  Pt has already received pain medications.      Plan of Care: Pt may benefit from something to help her rest as to her medications have been given.  Please call with future concerns.      Event Summary:   MD Notified: yes Call Time: 1950 Arrival Time: 1952 End Time: 2010  Robbyn Hodkinson [place], RN

## 2023-09-27 ENCOUNTER — Encounter (HOSPITAL_COMMUNITY): Payer: Self-pay | Admitting: Family Medicine

## 2023-09-27 ENCOUNTER — Inpatient Hospital Stay (HOSPITAL_COMMUNITY)

## 2023-09-27 DIAGNOSIS — F1123 Opioid dependence with withdrawal: Secondary | ICD-10-CM

## 2023-09-27 DIAGNOSIS — R1011 Right upper quadrant pain: Secondary | ICD-10-CM | POA: Diagnosis not present

## 2023-09-27 DIAGNOSIS — B20 Human immunodeficiency virus [HIV] disease: Secondary | ICD-10-CM

## 2023-09-27 DIAGNOSIS — R945 Abnormal results of liver function studies: Secondary | ICD-10-CM | POA: Diagnosis not present

## 2023-09-27 DIAGNOSIS — L03115 Cellulitis of right lower limb: Secondary | ICD-10-CM | POA: Diagnosis not present

## 2023-09-27 DIAGNOSIS — R509 Fever, unspecified: Secondary | ICD-10-CM

## 2023-09-27 DIAGNOSIS — R7989 Other specified abnormal findings of blood chemistry: Secondary | ICD-10-CM | POA: Diagnosis not present

## 2023-09-27 DIAGNOSIS — B957 Other staphylococcus as the cause of diseases classified elsewhere: Secondary | ICD-10-CM

## 2023-09-27 LAB — COMPREHENSIVE METABOLIC PANEL WITH GFR
ALT: 52 U/L — ABNORMAL HIGH (ref 0–44)
AST: 151 U/L — ABNORMAL HIGH (ref 15–41)
Albumin: 2.1 g/dL — ABNORMAL LOW (ref 3.5–5.0)
Alkaline Phosphatase: 1122 U/L — ABNORMAL HIGH (ref 38–126)
Anion gap: 9 (ref 5–15)
BUN: 18 mg/dL (ref 6–20)
CO2: 20 mmol/L — ABNORMAL LOW (ref 22–32)
Calcium: 7.9 mg/dL — ABNORMAL LOW (ref 8.9–10.3)
Chloride: 104 mmol/L (ref 98–111)
Creatinine, Ser: 0.79 mg/dL (ref 0.44–1.00)
GFR, Estimated: >60 mL/min (ref >60)
Glucose, Bld: 98 mg/dL (ref 70–99)
Potassium: 4.5 mmol/L (ref 3.5–5.1)
Sodium: 133 mmol/L — ABNORMAL LOW (ref 135–145)
Total Bilirubin: 2.9 mg/dL — ABNORMAL HIGH (ref 0.0–1.2)
Total Protein: 6.7 g/dL (ref 6.5–8.1)

## 2023-09-27 LAB — HEPATITIS PANEL, ACUTE
HCV Ab: REACTIVE — AB
Hep A IgM: NONREACTIVE
Hep B C IgM: NONREACTIVE
Hepatitis B Surface Ag: NONREACTIVE

## 2023-09-27 LAB — CBC
HCT: 28.3 % — ABNORMAL LOW (ref 36.0–46.0)
Hemoglobin: 8.8 g/dL — ABNORMAL LOW (ref 12.0–15.0)
MCH: 25.2 pg — ABNORMAL LOW (ref 26.0–34.0)
MCHC: 31.1 g/dL (ref 30.0–36.0)
MCV: 81.1 fL (ref 80.0–100.0)
Platelets: 157 K/uL (ref 150–400)
RBC: 3.49 MIL/uL — ABNORMAL LOW (ref 3.87–5.11)
RDW: 20.3 % — ABNORMAL HIGH (ref 11.5–15.5)
WBC: 6.1 K/uL (ref 4.0–10.5)
nRBC: 0 % (ref 0.0–0.2)

## 2023-09-27 LAB — PROCALCITONIN: Procalcitonin: 0.24 ng/mL

## 2023-09-27 LAB — PRO BRAIN NATRIURETIC PEPTIDE: Pro Brain Natriuretic Peptide: 2209 pg/mL — ABNORMAL HIGH (ref <300.0)

## 2023-09-27 MED ORDER — CLONIDINE HCL 0.1 MG PO TABS: 0.1000 mg | ORAL_TABLET | Freq: Every day | ORAL | Status: DC

## 2023-09-27 MED ORDER — TECHNETIUM TC 99M MEBROFENIN IV KIT
7.2100 | PACK | Freq: Once | INTRAVENOUS | Status: AC | PRN
  Administered 2023-09-27: 7.21 via INTRAVENOUS

## 2023-09-27 MED ORDER — ONDANSETRON 4 MG PO TBDP
4.0000 mg | ORAL_TABLET | Freq: Four times a day (QID) | ORAL | Status: AC | PRN
  Administered 2023-10-02: 4 mg via ORAL
  Filled 2023-09-27: qty 1

## 2023-09-27 MED ORDER — DICYCLOMINE HCL 20 MG PO TABS
20.0000 mg | ORAL_TABLET | Freq: Four times a day (QID) | ORAL | Status: AC | PRN
  Administered 2023-09-28 (×2): 20 mg via ORAL
  Filled 2023-09-27 (×4): qty 1

## 2023-09-27 MED ORDER — ACETAMINOPHEN 325 MG PO TABS
650.0000 mg | ORAL_TABLET | Freq: Four times a day (QID) | ORAL | Status: DC | PRN
  Administered 2023-09-28 – 2023-10-02 (×2): 650 mg via ORAL
  Filled 2023-09-27 (×2): qty 2

## 2023-09-27 MED ORDER — HYDROXYZINE HCL 25 MG PO TABS
25.0000 mg | ORAL_TABLET | Freq: Four times a day (QID) | ORAL | Status: DC | PRN
  Administered 2023-09-27 – 2023-09-29 (×7): 25 mg via ORAL
  Filled 2023-09-27 (×7): qty 1

## 2023-09-27 MED ORDER — ACETAMINOPHEN 325 MG PO TABS
ORAL_TABLET | ORAL | Status: AC
  Administered 2023-09-27: 650 mg
  Filled 2023-09-27: qty 1

## 2023-09-27 MED ORDER — SODIUM CHLORIDE 0.9 % IV SOLN: INTRAVENOUS | Status: DC

## 2023-09-27 MED ORDER — OXYCODONE HCL 5 MG PO TABS
5.0000 mg | ORAL_TABLET | Freq: Four times a day (QID) | ORAL | Status: DC | PRN
  Administered 2023-09-28 – 2023-09-30 (×8): 10 mg via ORAL
  Administered 2023-09-30: 5 mg via ORAL
  Administered 2023-09-30 – 2023-10-03 (×5): 10 mg via ORAL
  Filled 2023-09-27 (×8): qty 2
  Filled 2023-09-27: qty 1
  Filled 2023-09-27 (×6): qty 2

## 2023-09-27 MED ORDER — KETOROLAC TROMETHAMINE 15 MG/ML IJ SOLN
15.0000 mg | Freq: Four times a day (QID) | INTRAMUSCULAR | Status: AC | PRN
  Administered 2023-09-27 – 2023-10-01 (×12): 15 mg via INTRAVENOUS
  Filled 2023-09-27 (×13): qty 1

## 2023-09-27 MED ORDER — LOPERAMIDE HCL 2 MG PO CAPS: 2.0000 mg | ORAL_CAPSULE | ORAL | Status: AC | PRN

## 2023-09-27 MED ORDER — CLONIDINE HCL 0.1 MG PO TABS
0.1000 mg | ORAL_TABLET | ORAL | Status: DC
  Administered 2023-09-29: 0.1 mg via ORAL
  Filled 2023-09-27: qty 1

## 2023-09-27 MED ORDER — NAPROXEN 250 MG PO TABS
500.0000 mg | ORAL_TABLET | Freq: Two times a day (BID) | ORAL | Status: DC | PRN
  Administered 2023-09-27: 500 mg via ORAL
  Filled 2023-09-27: qty 1

## 2023-09-27 MED ORDER — METHOCARBAMOL 500 MG PO TABS
500.0000 mg | ORAL_TABLET | Freq: Three times a day (TID) | ORAL | Status: AC | PRN
  Administered 2023-09-27 – 2023-10-02 (×8): 500 mg via ORAL
  Filled 2023-09-27 (×8): qty 1

## 2023-09-27 MED ORDER — VANCOMYCIN HCL IN DEXTROSE 1-5 GM/200ML-% IV SOLN
1000.0000 mg | Freq: Two times a day (BID) | INTRAVENOUS | Status: DC
  Administered 2023-09-27 – 2023-09-28 (×3): 1000 mg via INTRAVENOUS
  Filled 2023-09-27 (×3): qty 200

## 2023-09-27 MED ORDER — SODIUM CHLORIDE 0.9% FLUSH: 10.0000 mL | INTRAVENOUS | Status: DC | PRN

## 2023-09-27 MED ORDER — DIAZEPAM 5 MG/ML IJ SOLN
2.5000 mg | Freq: Once | INTRAMUSCULAR | Status: AC
  Administered 2023-09-27: 2.5 mg via INTRAVENOUS
  Filled 2023-09-27: qty 2

## 2023-09-27 MED ORDER — CLONIDINE HCL 0.1 MG PO TABS
0.1000 mg | ORAL_TABLET | Freq: Four times a day (QID) | ORAL | Status: AC
  Administered 2023-09-27 – 2023-09-28 (×8): 0.1 mg via ORAL
  Filled 2023-09-27 (×8): qty 1

## 2023-09-27 MED ORDER — IPRATROPIUM-ALBUTEROL 0.5-2.5 (3) MG/3ML IN SOLN
3.0000 mL | RESPIRATORY_TRACT | Status: AC
  Administered 2023-09-27: 3 mL via RESPIRATORY_TRACT
  Filled 2023-09-27: qty 3

## 2023-09-27 NOTE — Progress Notes (Signed)
 PROGRESS NOTE  [name] [name]  FMW:[id number] DOB: [birth year] DOA: 09/25/2023 PCP: Pcp, No   Brief Narrative: Patient is a 43 year old female with history of anxiety, depression, chronic back pain, cocaine abuse, alcohol  abuse, opiate use disorder, septic emboli, spinal epidural abscess who presented to the emergency department with complaint of fever, lower extremity swelling, right upper quadrant pain.  On presentation she was hypertensive, tachycardic but afebrile.  CT/x-ray of left lower extremity showed nonspecific edema which could be from cellulitis or postoperative changes due to her history of necrotizing fasciitis.  CT abdomen/pelvis showed findings suspicious for acute cholecystitis, mild dilation of CBD, no cholelithiasis or gallbladder wall edema.  CT imaging of the chest showed diffuse patchy bilateral interstitial opacities consistent with multifocal pneumonia.  Lab work showed elevated liver enzymes, ALP.  Ultrasound of the right upper quadrant showed hepatic steatosis, splenomegaly.  Suspected sepsis so started on broad antibiotics.  Cultures pending.  Plan for HIDA scan  Assessment & Plan:  Principal Problem:   Elevated LFTs Active Problems:   MDD (major depressive disorder), recurrent severe, without psychosis (HCC)   RUQ pain   Substance abuse (HCC)   Cellulitis of both lower extremities   Normocytic anemia   Protein-calorie malnutrition, severe (HCC)   Hyponatremia   Thrombocytopenia (HCC)  Suspected sepsis: Presented with fever, tachycardia.  No leukocytosis.  Has erythema and edema of right lower extremity.  Likely cellulitis.  She had surgery for necrotizing fasciitis of left lower extremity in 2021.  She still has some sutures there .Dr Harden had operated on her. Imaging showed bilateral possible multifocal pneumonia.  Continue current antibiotics for now.  Follow-up cultures.  One of the blood culture sent from anaerobic bottle showed Staph epidermidis, contamination  suspected but true infection not excluded because of history of drug abuse.  Will repeat culture.  ID consulted.  Continue current antibiotics.  Follow-up urine culture. Continue IV fluids.  Remains in sinus tachycardia  Right upper quadrant pain/elevated LFTs: Presented with fever, right upper quadrant pain, tachycardia.  She complains of right upper quadrant pain radiating to her right thigh.  Lab work showed mild elevated liver enzymes, elevated ALP. CT abdomen/pelvis showed findings suspicious for acute cholecystitis, mild dilation of CBD, no cholelithiasis or gallbladder wall edema.  Ultrasound of the right upper quadrant showed hepatic steatosis, splenomegaly.  HIDA scan pending. If HIDA scan is nonreassuring, we have to order MRCP. ID ordered EBV, CMV PCR  Acute hypoxic respiratory failure/multifocal pneumonia: Continue current antibiotics.  Continue bronchodilators as needed.  Wean the oxygen if possible.  Currently on 4 L of oxygen per minute.  History of substance abuse: Injects fentanyl .  Counseled for cessation.  She will need to be followed by Suboxone /methadone clinic on discharge  Normocytic anemia: Current hemoglobin stable.  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  ID consulting.  Ordered PCR, CD4, RPR, gonorrhea, chlamydia, hepatitis panel          DVT prophylaxis:SCDs Start: 09/26/23 9094     Code Status: Full Code  Family Communication: None at bedside  Patient status:Inpatient  Patient is from :home  Anticipated discharge to:not sure  Estimated DC date:not sure   Consultants: ID,GI  Procedures:None  Antimicrobials:  Anti-infectives (From admission, onward)    Start     Dose/Rate Route Frequency Ordered Stop   09/27/23 1200  vancomycin  (VANCOREADY) IVPB 1250 mg/250 mL        1,250  mg 166.7 mL/hr over 90 Minutes Intravenous Every 24 hours 09/26/23 1208      09/26/23 1215  vancomycin  (VANCOCIN ) IVPB 1000 mg/200 mL premix        1,000 mg 200 mL/hr over 60 Minutes Intravenous  Once 09/26/23 1208 09/26/23 1407   09/26/23 1200  cefTRIAXone  (ROCEPHIN ) 2 g in sodium chloride  0.9 % 100 mL IVPB        2 g 200 mL/hr over 30 Minutes Intravenous Every 24 hours 09/26/23 1122     09/26/23 1200  metroNIDAZOLE  (FLAGYL ) IVPB 500 mg        500 mg 100 mL/hr over 60 Minutes Intravenous Every 12 hours 09/26/23 1122     09/26/23 0000  vancomycin  (VANCOCIN ) IVPB 1000 mg/200 mL premix        1,000 mg 200 mL/hr over 60 Minutes Intravenous  Once 09/25/23 2359 09/26/23 0225   09/26/23 0000  ceFEPIme  (MAXIPIME ) 2 g in sodium chloride  0.9 % 100 mL IVPB        2 g 200 mL/hr over 30 Minutes Intravenous  Once 09/25/23 2359 09/26/23 0130       Subjective: Patient seen and examined at bedside today.  Very irritable, anxious.  Likely in opiate withdrawal.  Requesting for narcotics.  Not consolable. I had a long discussion with the patient at bedside about the need of the HIDA scan, opioid cannot be used until HIDA scan is complete.  Complains of pain on the right side of the abdomen radiating to her right thigh.  Remains in  significant sinus tachycardia, fever  Objective: Vitals:   09/26/23 1953 09/26/23 2110 09/27/23 0024 09/27/23 0536  BP: 136/89 130/73 (!) 138/92 129/83  Pulse: (!) 119 (!) 116 (!) 125 (!) 123  Resp: (!) 22 (!) 24 (!) 22 (!) 25  Temp: 98.6 F (37 C) 98.3 F (36.8 C) 98.4 F (36.9 C) 98.7 F (37.1 C)  TempSrc: Oral Oral Oral Oral  SpO2: 95% 94% 93% 95%  Weight:      Height:        Intake/Output Summary (Last 24 hours) at 09/27/2023 0741 Last data filed at 09/27/2023 0616 Gross per 24 hour  Intake 471.82 ml  Output 750 ml  Net -278.18 ml   Filed Weights   09/26/23 1441  Weight: 109.1 kg    Examination:  General exam: Irritable, anxious, likely in withdrawal, obese HEENT: PERRL Respiratory system:  no wheezes or crackles   Cardiovascular system: Sinus tachycardia.  Gastrointestinal system: Abdomen is nondistended, soft .  Tenderness on the right upper quadrant, good bowel sounds heard Central nervous system: Alert and oriented Extremities: Edema of  bilateral lower extremities, erythematous changes on the right lower extremity mainly on the shin, old necrotizing fasciitis eschar on the left shin with metallic sutures still in place Skin: Scattered rash, track marks on both arms     Data Reviewed: I have personally reviewed following labs and imaging studies  CBC: Recent Labs  Lab 09/26/23 0000 09/26/23 1545  WBC 4.9 5.3  NEUTROABS 3.1  --   HGB 8.8* 10.1*  HCT 28.0* 33.2*  MCV 81.2 82.8  PLT 145* 137*   Basic Metabolic Panel: Recent Labs  Lab 09/25/23 2251 09/26/23 0921  NA 135 134*  K 5.1 4.6  CL 105 106  CO2 21* 21*  GLUCOSE 93 86  BUN 23* 22*  CREATININE 0.83 0.84  CALCIUM  8.3* 7.6*  MG  --  2.2  PHOS  --  3.0  Recent Results (from the past 240 hours)  Resp panel by RT-PCR (RSV, Flu A&B, Covid) Anterior Nasal Swab     Status: None   Collection Time: 09/25/23  9:54 PM   Specimen: Anterior Nasal Swab  Result Value Ref Range Status   SARS Coronavirus 2 by RT PCR NEGATIVE NEGATIVE Final    Comment: (NOTE) SARS-CoV-2 target nucleic acids are NOT DETECTED.  The SARS-CoV-2 RNA is generally detectable in upper respiratory specimens during the acute phase of infection. The lowest concentration of SARS-CoV-2 viral copies this assay can detect is 138 copies/mL. A negative result does not preclude SARS-Cov-2 infection and should not be used as the sole basis for treatment or other patient management decisions. A negative result may occur with  improper specimen collection/handling, submission of specimen other than nasopharyngeal swab, presence of viral mutation(s) within the areas targeted by this assay, and inadequate number of viral copies(<138 copies/mL). A negative result  must be combined with clinical observations, patient history, and epidemiological information. The expected result is Negative.  Fact Sheet for Patients:  BloggerCourse.com  Fact Sheet for Healthcare Providers:  SeriousBroker.it  This test is no t yet approved or cleared by the [place]  FDA and  has been authorized for detection and/or diagnosis of SARS-CoV-2 by FDA under an Emergency Use Authorization (EUA). This EUA will remain  in effect (meaning this test can be used) for the duration of the COVID-19 declaration under Section 564(b)(1) of the Act, 21 U.S.C.section 360bbb-3(b)(1), unless the authorization is terminated  or revoked sooner.       Influenza A by PCR NEGATIVE NEGATIVE Final   Influenza B by PCR NEGATIVE NEGATIVE Final    Comment: (NOTE) The Xpert Xpress SARS-CoV-2/FLU/RSV plus assay is intended as an aid in the diagnosis of influenza from Nasopharyngeal swab specimens and should not be used as a sole basis for treatment. Nasal washings and aspirates are unacceptable for Xpert Xpress SARS-CoV-2/FLU/RSV testing.  Fact Sheet for Patients: BloggerCourse.com  Fact Sheet for Healthcare Providers: SeriousBroker.it  This test is not yet approved or cleared by the [place]  FDA and has been authorized for detection and/or diagnosis of SARS-CoV-2 by FDA under an Emergency Use Authorization (EUA). This EUA will remain in effect (meaning this test can be used) for the duration of the COVID-19 declaration under Section 564(b)(1) of the Act, 21 U.S.C. section 360bbb-3(b)(1), unless the authorization is terminated or revoked.     Resp Syncytial Virus by PCR NEGATIVE NEGATIVE Final    Comment: (NOTE) Fact Sheet for Patients: BloggerCourse.com  Fact Sheet for Healthcare Providers: SeriousBroker.it  This test is  not yet approved or cleared by the [place]  FDA and has been authorized for detection and/or diagnosis of SARS-CoV-2 by FDA under an Emergency Use Authorization (EUA). This EUA will remain in effect (meaning this test can be used) for the duration of the COVID-19 declaration under Section 564(b)(1) of the Act, 21 U.S.C. section 360bbb-3(b)(1), unless the authorization is terminated or revoked.  Performed at [facility], [address] [place], [place] [address]   Culture, blood (Routine X 2) w Reflex to ID Panel     Status: None (Preliminary result)   Collection Time: 09/25/23  9:54 PM   Specimen: BLOOD  Result Value Ref Range Status   Specimen Description   Final    BLOOD BLOOD RIGHT ARM Performed at [facility], [address], [place], [place] [address]    Special Requests   Final  BOTTLES DRAWN AEROBIC AND ANAEROBIC Blood Culture results may not be optimal due to an inadequate volume of blood received in culture bottles Performed at [facility], [address], [place], [place] [address]    Culture   Final    NO GROWTH < 12 HOURS Performed at [facility], [address], [place], [place] [address]    Report Status PENDING  Incomplete  Culture, blood (Routine X 2) w Reflex to ID Panel     Status: None (Preliminary result)   Collection Time: 09/25/23  9:54 PM   Specimen: BLOOD LEFT ARM  Result Value Ref Range Status   Specimen Description   Final    BLOOD LEFT ARM Performed at [facility], [address], [place], [place] [address]    Special Requests   Final    BOTTLES DRAWN AEROBIC AND ANAEROBIC Blood Culture adequate volume Performed at [facility], [address], [place], [place] [address]    Culture  Setup Time   Final    GRAM POSITIVE COCCI IN CLUSTERS ANAEROBIC BOTTLE ONLY CRITICAL RESULT CALLED TO, READ BACK BY AND VERIFIED WITH: PHARMD E JACKSON 09/26/2023 @ 2338  BY AB Performed at [facility], [address] [place], [place] [address]    Culture GRAM POSITIVE COCCI  Final   Report Status PENDING  Incomplete  Blood Culture ID Panel (Reflexed)     Status: Abnormal   Collection Time: 09/25/23  9:54 PM  Result Value Ref Range Status   Enterococcus faecalis NOT DETECTED NOT DETECTED Final   Enterococcus Faecium NOT DETECTED NOT DETECTED Final   Listeria monocytogenes NOT DETECTED NOT DETECTED Final   Staphylococcus species DETECTED (A) NOT DETECTED Final    Comment: CRITICAL RESULT CALLED TO, READ BACK BY AND VERIFIED WITH: PHARMD E JACKSON 09/26/2023 @ 2338 BY AB    Staphylococcus aureus (BCID) NOT DETECTED NOT DETECTED Final   Staphylococcus epidermidis DETECTED (A) NOT DETECTED Final    Comment: CRITICAL RESULT CALLED TO, READ BACK BY AND VERIFIED WITH: PHARMD E JACKSON 09/26/2023 @ 2338 BY AB    Staphylococcus lugdunensis NOT DETECTED NOT DETECTED Final   Streptococcus species NOT DETECTED NOT DETECTED Final   Streptococcus agalactiae NOT DETECTED NOT DETECTED Final   Streptococcus pneumoniae NOT DETECTED NOT DETECTED Final   Streptococcus pyogenes NOT DETECTED NOT DETECTED Final   A.calcoaceticus-baumannii NOT DETECTED NOT DETECTED Final   Bacteroides fragilis NOT DETECTED NOT DETECTED Final   Enterobacterales NOT DETECTED NOT DETECTED Final   Enterobacter cloacae complex NOT DETECTED NOT DETECTED Final   Escherichia coli NOT DETECTED NOT DETECTED Final   Klebsiella aerogenes NOT DETECTED NOT DETECTED Final   Klebsiella oxytoca NOT DETECTED NOT DETECTED Final   Klebsiella pneumoniae NOT DETECTED NOT DETECTED Final   Proteus species NOT DETECTED NOT DETECTED Final   Salmonella species NOT DETECTED NOT DETECTED Final   Serratia marcescens NOT DETECTED NOT DETECTED Final   Haemophilus influenzae NOT DETECTED NOT DETECTED Final   Neisseria meningitidis NOT DETECTED NOT DETECTED Final   Pseudomonas aeruginosa NOT DETECTED NOT DETECTED  Final   Stenotrophomonas maltophilia NOT DETECTED NOT DETECTED Final   Candida albicans NOT DETECTED NOT DETECTED Final   Candida auris NOT DETECTED NOT DETECTED Final   Candida glabrata NOT DETECTED NOT DETECTED Final   Candida krusei NOT DETECTED NOT DETECTED Final   Candida parapsilosis NOT DETECTED NOT DETECTED Final   Candida tropicalis NOT DETECTED NOT DETECTED Final  Cryptococcus neoformans/gattii NOT DETECTED NOT DETECTED Final   Methicillin resistance mecA/C NOT DETECTED NOT DETECTED Final    Comment: Performed at [facility], [address], [place], [place] [address]     Radiology Studies: VAS US  LOWER EXTREMITY VENOUS (DVT) Result Date: 09/26/2023  Lower Venous DVT Study Patient Name:  LACHLAN PELTO  Date of Exam:   09/26/2023 Medical Rec #: [id number]      Accession #:    [id number] Date of Birth: 1980/05/03      Patient Gender: F Patient Age:   13 years Exam Location:  [facility] Procedure:      VAS US  LOWER EXTREMITY VENOUS (DVT) Referring Phys: DAVID ORTIZ --------------------------------------------------------------------------------  Indications: Swelling, Pain, Edema, and history of necrotizing fascitis.  Limitations: Body habitus and poor ultrasound/tissue interface. Comparison Study: No prior exam. Performing Technologist: Edilia Elden Appl  Examination Guidelines: A complete evaluation includes B-mode imaging, spectral Doppler, color Doppler, and power Doppler as needed of all accessible portions of each vessel. Bilateral testing is considered an integral part of a complete examination. Limited examinations for reoccurring indications may be performed as noted. The reflux portion of the exam is performed with the patient in reverse Trendelenburg.  +---------+---------------+---------+-----------+----------+-------------------+ RIGHT    CompressibilityPhasicitySpontaneityPropertiesThrombus Aging       +---------+---------------+---------+-----------+----------+-------------------+ CFV      Full           Yes      Yes                                      +---------+---------------+---------+-----------+----------+-------------------+ SFJ      Full           Yes      Yes                                      +---------+---------------+---------+-----------+----------+-------------------+ FV Prox  Full                                                             +---------+---------------+---------+-----------+----------+-------------------+ FV Mid   Full                                                             +---------+---------------+---------+-----------+----------+-------------------+ FV DistalFull                                                             +---------+---------------+---------+-----------+----------+-------------------+ PFV      Full                                                             +---------+---------------+---------+-----------+----------+-------------------+  POP      Full           Yes      Yes                                      +---------+---------------+---------+-----------+----------+-------------------+ PTV                                                   Not well visualized +---------+---------------+---------+-----------+----------+-------------------+ PERO     Full                                                             +---------+---------------+---------+-----------+----------+-------------------+ Right posterior tibial veins not visualized on this exam.  +---------+---------------+---------+-----------+----------+--------------+ LEFT     CompressibilityPhasicitySpontaneityPropertiesThrombus Aging +---------+---------------+---------+-----------+----------+--------------+ CFV      Full           Yes      Yes                                  +---------+---------------+---------+-----------+----------+--------------+ SFJ      Full           Yes      Yes                                 +---------+---------------+---------+-----------+----------+--------------+ FV Prox  Full                                                        +---------+---------------+---------+-----------+----------+--------------+ FV Mid   Full                                                        +---------+---------------+---------+-----------+----------+--------------+ FV DistalFull                                                        +---------+---------------+---------+-----------+----------+--------------+ PFV      Full                                                        +---------+---------------+---------+-----------+----------+--------------+ POP      Full           Yes      Yes                                 +---------+---------------+---------+-----------+----------+--------------+  PTV      Full                                                        +---------+---------------+---------+-----------+----------+--------------+ PERO     Full                                                        +---------+---------------+---------+-----------+----------+--------------+    Summary: RIGHT: - There is no evidence of deep vein thrombosis in the lower extremity. However, portions of this examination were limited- see technologist comments above.  - No cystic structure found in the popliteal fossa.  LEFT: - There is no evidence of deep vein thrombosis in the lower extremity.  - No cystic structure found in the popliteal fossa.  *See table(s) above for measurements and observations.    Preliminary    CT Angio Chest Pulmonary Embolism (PE) W or WO Contrast Result Date: 09/26/2023 CLINICAL DATA:  Shortness of breath. Acute bilateral lower extremity swelling, fever. EXAM: CT ANGIOGRAPHY CHEST WITH CONTRAST TECHNIQUE:  Multidetector CT imaging of the chest was performed using the standard protocol during bolus administration of intravenous contrast. Multiplanar CT image reconstructions and MIPs were obtained to evaluate the vascular anatomy. RADIATION DOSE REDUCTION: This exam was performed according to the departmental dose-optimization program which includes automated exposure control, adjustment of the mA and/or kV according to patient size and/or use of iterative reconstruction technique. CONTRAST:  75mL OMNIPAQUE  IOHEXOL  350 MG/ML SOLN COMPARISON:  November 02, 2018. FINDINGS: Cardiovascular: Satisfactory opacification of the pulmonary arteries to the segmental level. No evidence of pulmonary embolism. Normal heart size. No pericardial effusion. Mediastinum/Nodes: No significant mediastinal adenopathy is noted. Mildly enlarged bilateral axillary lymph nodes are noted which most likely are inflammatory or reactive in etiology. Thyroid gland, trachea, and esophagus demonstrate no significant findings. Lungs/Pleura: No pneumothorax or pleural effusion is noted. Diffuse patchy and interstitial opacities are noted throughout both lungs concerning for edema or possibly multifocal pneumonia. Upper Abdomen: Hepatic steatosis. Musculoskeletal: No chest wall abnormality. No acute or significant osseous findings. Review of the MIP images confirms the above findings. IMPRESSION: 1. No definite evidence of pulmonary embolus. 2. Diffuse patchy and interstitial opacities are noted throughout both lungs concerning for edema or possibly multifocal pneumonia. 3. Hepatic steatosis. Electronically Signed   By: Lynwood Landy Raddle M.D.   On: 09/26/2023 14:01   US  Abdomen Limited RUQ (LIVER/GB) Result Date: 09/26/2023 EXAM: Right Upper Quadrant Abdominal Ultrasound TECHNIQUE: Real-time ultrasonography of the right upper quadrant of the abdomen was performed. COMPARISON: None. CLINICAL HISTORY: Right flank discomfort [id number]. FINDINGS: LIVER: The liver  demonstrates heterogeneous and increased echogenicity. No intrahepatic biliary ductal dilatation. No mass. BILIARY SYSTEM: No evidence of pericholecystic fluid or wall thickening. No cholelithiasis. Positive sonographic Murphy's sign. Common bile duct mildly dilated measuring 7mm. OTHER: No right upper quadrant ascites. IMPRESSION: 1. Findings are equivocal for acute cholecystitis. A positive sonographic murphy sign was noted by the sonographer and there is mild dilation of the common bile duct. However, there is no cholelithiasis, gallbladder wall thickening, or pericholecystic fluid. Consider HIDA scan for further evaluation. Electronically signed by: Norman Gatlin MD 09/26/2023 02:53  AM EDT RP Workstation: [id number]   CT ABDOMEN PELVIS W CONTRAST Result Date: 09/26/2023 EXAM: CT ABDOMEN AND PELVIS WITH CONTRAST 09/26/2023 12:58:30 AM TECHNIQUE: CT of the abdomen and pelvis was performed with the administration of intravenous contrast, specifically 100mL of iohexol  (OMNIPAQUE ) 300 MG/ML solution. Multiplanar reformatted images are provided for review. Automated exposure control, iterative reconstruction, and/or weight-based adjustment of the mA/kV was utilized to reduce the radiation dose to as low as reasonably achievable. COMPARISON: Apr 10, 2018 CLINICAL HISTORY: Abdominal pain, acute, nonlocalized. FINDINGS: LOWER CHEST: No acute abnormality. LIVER: Hepatic steatosis. GALLBLADDER AND BILE DUCTS: Gallbladder is unremarkable. No biliary ductal dilatation. SPLEEN: The spleen measures 17.0 cm in craniocaudal dimension and contains a hypoattenuating lesion in the posterior spleen, which is likely a benign cyst or hemangioma. Splenomegaly is present. PANCREAS: No acute abnormality. ADRENAL GLANDS: No acute abnormality. KIDNEYS, URETERS AND BLADDER: No stones in the kidneys or ureters. No hydronephrosis. No perinephric or periureteral stranding. Urinary bladder is unremarkable. GI AND BOWEL: Stomach demonstrates  no acute abnormality. There is no bowel obstruction. PERITONEUM AND RETROPERITONEUM: No ascites.  No free air. VASCULATURE: Aorta is normal in caliber. LYMPH NODES: No lymphadenopathy. REPRODUCTIVE ORGANS: No acute abnormality. BONES AND SOFT TISSUES: No acute osseous abnormality. Edema in the subcutaneous fat of the bilateral flanks with subcutaneous stranding and edema with skin thickening in the subcutaneous fat of the pannus. Additional right greater than left edema and stranding in the subcutaneous fat of the thighs. No focal soft tissue abnormality. IMPRESSION: 1. Nonspecific edema in the subcutaneous fat of the bilateral flanks, pannus, and thighs (right greater than left). Correlate for cellulitis. 2. Hepatic steatosis. 3. Splenomegaly with a hypoattenuating lesion in the posterior spleen, likely a benign cyst or hemangioma. Electronically signed by: Norman Gatlin MD 09/26/2023 01:13 AM EDT RP Workstation: [id number]   CT EXTREMITY LOWER LEFT W CONTRAST Result Date: 09/26/2023 CLINICAL DATA:  Osteomyelitis suspected, skin graft to left lower extremity. Swelling, fever. EXAM: CT OF THE LOWER LEFT EXTREMITY WITH CONTRAST TECHNIQUE: Multidetector CT imaging of the lower left extremity was performed according to the standard protocol following intravenous contrast administration. RADIATION DOSE REDUCTION: This exam was performed according to the departmental dose-optimization program which includes automated exposure control, adjustment of the mA and/or kV according to patient size and/or use of iterative reconstruction technique. CONTRAST: 100mL OMNIPAQUE  IOHEXOL  300 MG/ML  SOLN COMPARISON:  None Available. FINDINGS: Bones/Joint/Cartilage No acute bony abnormality. Specifically, no fracture, subluxation, or dislocation. No bone destruction to suggest osteomyelitis. Small joint effusion within the left knee. Ligaments Suboptimally assessed by CT. Muscles and Tendons Unremarkable Soft tissues Skin staples  noted anteriorly within the left shin region presumably related to reported skin graft. Edema within the subcutaneous soft tissues could be postoperative or related to cellulitis. No focal fluid collection. IMPRESSION: No acute bony abnormality. No bone destruction to suggest osteomyelitis. Edema throughout the subcutaneous soft tissues in the left lower extremity from the distal thigh through the calf could be postoperative or related to cellulitis. Electronically Signed   By: Franky Crease M.D.   On: 09/26/2023 01:09   DG Tibia/Fibula Left Result Date: 09/25/2023 CLINICAL DATA:  Skin graft left lower extremity, swelling, fever EXAM: LEFT TIBIA AND FIBULA - 2 VIEW COMPARISON:  None Available. FINDINGS: Frontal and lateral views of the left lower leg are obtained. Multiple surgical staples are seen within the anterior proximal left lower leg consistent with history of skin graft surgery. There is diffuse subcutaneous edema. No subcutaneous gas.  No acute or destructive bony abnormalities. IMPRESSION: 1. Postsurgical changes from skin graft procedure. 2. Extensive subcutaneous edema throughout the visualized left lower leg, which could be postoperative or related to cellulitis. 3. No acute or destructive bony abnormality. Electronically Signed   By: Ozell Daring M.D.   On: 09/25/2023 22:52   DG Chest Port 1 View Result Date: 09/25/2023 CLINICAL DATA:  Short of breath, fever EXAM: PORTABLE CHEST 1 VIEW COMPARISON:  10/21/2019 FINDINGS: The heart size and mediastinal contours are within normal limits. Both lungs are clear. The visualized skeletal structures are unremarkable. IMPRESSION: No active disease. Electronically Signed   By: Ozell Daring M.D.   On: 09/25/2023 22:51    Scheduled Meds:  influenza vac split trivalent PF  0.5 mL Intramuscular Tomorrow-1000   Continuous Infusions:  cefTRIAXone  (ROCEPHIN )  IV 2 g (09/26/23 1306)   metronidazole  500 mg (09/26/23 2351)   vancomycin        LOS: 1 day    Ivonne Mustache, MD [facility] P9/07/2023, 7:41 AM

## 2023-09-27 NOTE — Consult Note (Signed)
 Reason for Consult: Abdominal pain elevated liver tests Referring Physician: Hospital team  [name] [name] is an 43 y.o. female.  HPI: Patient seen and examined and her hospital computer chart reviewed and she denies any previous GI issues and is on chronic narcotics for her back and her dad had hepatitis C and other family members have had their gallbladder out and her pain started yesterday and she had a fever prior to that for a few days and she does move her bowels okay with MiraLAX  and food has not made the pain worse she has not had any nausea or vomiting and she says she has been told she had a liver problem in the past but does not know what it was and her hepatitis panel is pending and she did have a daughter acutely dying and has a lot of stress and depression over that and has not seen a psychologist or psychiatrist which she probably needs and she has no other complaints  Past Medical History:  Diagnosis Date   Alcohol  use disorder 01/04/2012   Detox medically managed successfully during inpatient, adequate for discharge      Anxiety    klonipin for stress disorder   Chronic back pain    Cocaine abuse (HCC) 07/06/2013   Degenerative disc disease    Depression 05/22/2015   Endometriosis    chronic pain   GERD (gastroesophageal reflux disease)    protonix  evenings   Migraine    Septic embolism (HCC)    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   Urinary retention     Past Surgical History:  Procedure Laterality Date   ABDOMINAL HYSTERECTOMY     APPENDECTOMY     APPLICATION OF WOUND VAC Left 10/28/2019   Procedure: APPLICATION OF WOUND VAC;  Surgeon: Harden Jerona GAILS, MD;  Location: [facility] OR;  Service: Orthopedics;  Laterality: Left;   I & D EXTREMITY Left 10/22/2019   Procedure: IRRIGATION AND DEBRIDEMENT EXTREMITY; APPLICATION OF WOUND VAC;  Surgeon: Beverley Evalene BIRCH, MD;  Location: [facility] OR;  Service: Orthopedics;  Laterality: Left;   I & D EXTREMITY Left 10/26/2019   Procedure:  DEBRIDEMENT LEFT TIBIA;  Surgeon: Harden Jerona GAILS, MD;  Location: [facility] OR;  Service: Orthopedics;  Laterality: Left;  left   I & D EXTREMITY Left 10/28/2019   Procedure: REPEAT DEBRIDEMENT LEFT TIBIA;  Surgeon: Harden Jerona GAILS, MD;  Location: [facility] OR;  Service: Orthopedics;  Laterality: Left;   IR THORACENTESIS ASP PLEURAL SPACE W/IMG GUIDE  10/28/2018   LAMINECTOMY FOR CEREBROSPINAL FLUID LEAK N/A 10/31/2018   Procedure: Thoracic Wound Exploration;  Surgeon: Gillie Duncans, MD;  Location: [facility] OR;  Service: Neurosurgery;  Laterality: N/A;   LAPAROSCOPIC ASSISTED VAGINAL HYSTERECTOMY  08/19/2011   Procedure: LAPAROSCOPIC ASSISTED VAGINAL HYSTERECTOMY;  Surgeon: Peggye Gull, MD;  Location: [facility] ORS;  Service: Gynecology;  Laterality: N/A;   TEE WITHOUT CARDIOVERSION N/A 10/25/2018   Procedure: TRANSESOPHAGEAL ECHOCARDIOGRAM (TEE);  Surgeon: Delford Maude BROCKS, MD;  Location: [facility] ENDOSCOPY;  Service: Cardiovascular;  Laterality: N/A;   THORACIC LAMINECTOMY FOR EPIDURAL ABSCESS N/A 10/20/2018   Procedure: THORACIC Eight, Thoracic nine LAMINECTOMY FOR EPIDURAL ABSCESS;  Surgeon: Joshua Alm RAMAN, MD;  Location: [facility] OR;  Service: Neurosurgery;  Laterality: N/A;   TONSILLECTOMY     TUBAL LIGATION      Family History  Problem Relation Age of Onset   Coronary artery disease Father    Coronary artery disease Maternal Grandmother    Breast cancer Mother    Cancer  Mother     Social History:  reports that she has been smoking cigarettes. She has a 14 pack-year smoking history. She has never used smokeless tobacco. She reports current drug use. Drugs: Heroin and IV. She reports that she does not drink alcohol .  Allergies:  Allergies  Allergen Reactions   Epidural Tray [id number] [Nerve Block Tray] Other (See Comments)    Paralysis and severe pain in head/neck/shoulders.  No anaphylaxis.   Ibuprofen  Hives   Penicillins Hives     Tolerated Cephalosporin 10/22/2019.   Pt has tolerated cephalosporins on past  admissions. Has patient had a PCN reaction causing immediate rash, facial/tongue/throat swelling, SOB or lightheadedness with hypotension: Yes Has patient had a PCN reaction causing severe rash involving mucus membranes or skin necrosis: No Has patient had a PCN reaction that required hospitalization No Has patient had a PCN reaction occurring within the last 10 years: No If all of the above answers are NO, then may   Zofran  [Ondansetron  Hcl] Nausea And Vomiting    Medications: I have reviewed the patient's current medications.  Results for orders placed or performed during the hospital encounter of 09/25/23 (from the past 48 hours)  Urinalysis, w/ Reflex to Culture (Infection Suspected) -Urine, Clean Catch     Status: Abnormal   Collection Time: 09/25/23  9:54 PM  Result Value Ref Range   Specimen Source URINE, CLEAN CATCH    Color, Urine AMBER (A) YELLOW    Comment: BIOCHEMICALS MAY BE AFFECTED BY COLOR   APPearance HAZY (A) CLEAR   Specific Gravity, Urine 1.019 1.005 - 1.030   pH 6.0 5.0 - 8.0   Glucose, UA NEGATIVE NEGATIVE mg/dL   Hgb urine dipstick SMALL (A) NEGATIVE   Bilirubin Urine NEGATIVE NEGATIVE   Ketones, ur NEGATIVE NEGATIVE mg/dL   Protein, ur 30 (A) NEGATIVE mg/dL   Nitrite NEGATIVE NEGATIVE   Leukocytes,Ua TRACE (A) NEGATIVE   RBC / HPF 6-10 0 - 5 RBC/hpf   WBC, UA 11-20 0 - 5 WBC/hpf    Comment:        Reflex urine culture not performed if WBC <=10, OR if Squamous epithelial cells >5. If Squamous epithelial cells >5 suggest recollection.    Bacteria, UA MANY (A) NONE SEEN   Squamous Epithelial / HPF 0-5 0 - 5 /HPF   Mucus PRESENT     Comment: Performed at [facility], [address] [place], [place] [address]  Resp panel by RT-PCR (RSV, Flu A&B, Covid) Anterior Nasal Swab     Status: None   Collection Time: 09/25/23  9:54 PM   Specimen: Anterior Nasal Swab  Result Value Ref Range   SARS Coronavirus 2 by RT PCR NEGATIVE NEGATIVE     Comment: (NOTE) SARS-CoV-2 target nucleic acids are NOT DETECTED.  The SARS-CoV-2 RNA is generally detectable in upper respiratory specimens during the acute phase of infection. The lowest concentration of SARS-CoV-2 viral copies this assay can detect is 138 copies/mL. A negative result does not preclude SARS-Cov-2 infection and should not be used as the sole basis for treatment or other patient management decisions. A negative result may occur with  improper specimen collection/handling, submission of specimen other than nasopharyngeal swab, presence of viral mutation(s) within the areas targeted by this assay, and inadequate number of viral copies(<138 copies/mL). A negative result must be combined with clinical observations, patient history, and epidemiological information. The expected result is Negative.  Fact Sheet for Patients:  BloggerCourse.com  Fact Sheet for  Healthcare Providers:  SeriousBroker.it  This test is no t yet approved or cleared by the [place]  FDA and  has been authorized for detection and/or diagnosis of SARS-CoV-2 by FDA under an Emergency Use Authorization (EUA). This EUA will remain  in effect (meaning this test can be used) for the duration of the COVID-19 declaration under Section 564(b)(1) of the Act, 21 U.S.C.section 360bbb-3(b)(1), unless the authorization is terminated  or revoked sooner.       Influenza A by PCR NEGATIVE NEGATIVE   Influenza B by PCR NEGATIVE NEGATIVE    Comment: (NOTE) The Xpert Xpress SARS-CoV-2/FLU/RSV plus assay is intended as an aid in the diagnosis of influenza from Nasopharyngeal swab specimens and should not be used as a sole basis for treatment. Nasal washings and aspirates are unacceptable for Xpert Xpress SARS-CoV-2/FLU/RSV testing.  Fact Sheet for Patients: BloggerCourse.com  Fact Sheet for Healthcare  Providers: SeriousBroker.it  This test is not yet approved or cleared by the [place]  FDA and has been authorized for detection and/or diagnosis of SARS-CoV-2 by FDA under an Emergency Use Authorization (EUA). This EUA will remain in effect (meaning this test can be used) for the duration of the COVID-19 declaration under Section 564(b)(1) of the Act, 21 U.S.C. section 360bbb-3(b)(1), unless the authorization is terminated or revoked.     Resp Syncytial Virus by PCR NEGATIVE NEGATIVE    Comment: (NOTE) Fact Sheet for Patients: BloggerCourse.com  Fact Sheet for Healthcare Providers: SeriousBroker.it  This test is not yet approved or cleared by the [place]  FDA and has been authorized for detection and/or diagnosis of SARS-CoV-2 by FDA under an Emergency Use Authorization (EUA). This EUA will remain in effect (meaning this test can be used) for the duration of the COVID-19 declaration under Section 564(b)(1) of the Act, 21 U.S.C. section 360bbb-3(b)(1), unless the authorization is terminated or revoked.  Performed at [facility], [address] [place], [place] [address]   Culture, blood (Routine X 2) w Reflex to ID Panel     Status: None (Preliminary result)   Collection Time: 09/25/23  9:54 PM   Specimen: BLOOD  Result Value Ref Range   Specimen Description      BLOOD BLOOD RIGHT ARM Performed at [facility], [address] [place], [place] [address]    Special Requests      BOTTLES DRAWN AEROBIC AND ANAEROBIC Blood Culture results may not be optimal due to an inadequate volume of blood received in culture bottles Performed at [facility], [address] [place], [place] [address]    Culture      NO GROWTH < 12 HOURS Performed at [facility], [address], [place], [place] [address]    Report Status PENDING    Culture, blood (Routine X 2) w Reflex to ID Panel     Status: None (Preliminary result)   Collection Time: 09/25/23  9:54 PM   Specimen: BLOOD LEFT ARM  Result Value Ref Range   Specimen Description      BLOOD LEFT ARM Performed at [facility], [address] [place], [place] [address]    Special Requests      BOTTLES DRAWN AEROBIC AND ANAEROBIC Blood Culture adequate volume Performed at [facility], [address], [place], [place] [address]    Culture  Setup Time      GRAM POSITIVE COCCI IN CLUSTERS ANAEROBIC BOTTLE ONLY CRITICAL RESULT CALLED TO, READ BACK  BY AND VERIFIED WITH: PHARMD E JACKSON 09/26/2023 @ 2338 BY AB    Culture      GRAM POSITIVE COCCI TOO YOUNG TO READ Performed at [facility], [address], [place], [place] [address]    Report Status PENDING   Blood Culture ID Panel (Reflexed)     Status: Abnormal   Collection Time: 09/25/23  9:54 PM  Result Value Ref Range   Enterococcus faecalis NOT DETECTED NOT DETECTED   Enterococcus Faecium NOT DETECTED NOT DETECTED   Listeria monocytogenes NOT DETECTED NOT DETECTED   Staphylococcus species DETECTED (A) NOT DETECTED    Comment: CRITICAL RESULT CALLED TO, READ BACK BY AND VERIFIED WITH: PHARMD E JACKSON 09/26/2023 @ 2338 BY AB    Staphylococcus aureus (BCID) NOT DETECTED NOT DETECTED   Staphylococcus epidermidis DETECTED (A) NOT DETECTED    Comment: CRITICAL RESULT CALLED TO, READ BACK BY AND VERIFIED WITH: PHARMD E JACKSON 09/26/2023 @ 2338 BY AB    Staphylococcus lugdunensis NOT DETECTED NOT DETECTED   Streptococcus species NOT DETECTED NOT DETECTED   Streptococcus agalactiae NOT DETECTED NOT DETECTED   Streptococcus pneumoniae NOT DETECTED NOT DETECTED   Streptococcus pyogenes NOT DETECTED NOT DETECTED   A.calcoaceticus-baumannii NOT DETECTED NOT DETECTED   Bacteroides fragilis NOT DETECTED NOT DETECTED   Enterobacterales NOT DETECTED NOT DETECTED   Enterobacter cloacae  complex NOT DETECTED NOT DETECTED   Escherichia coli NOT DETECTED NOT DETECTED   Klebsiella aerogenes NOT DETECTED NOT DETECTED   Klebsiella oxytoca NOT DETECTED NOT DETECTED   Klebsiella pneumoniae NOT DETECTED NOT DETECTED   Proteus species NOT DETECTED NOT DETECTED   Salmonella species NOT DETECTED NOT DETECTED   Serratia marcescens NOT DETECTED NOT DETECTED   Haemophilus influenzae NOT DETECTED NOT DETECTED   Neisseria meningitidis NOT DETECTED NOT DETECTED   Pseudomonas aeruginosa NOT DETECTED NOT DETECTED   Stenotrophomonas maltophilia NOT DETECTED NOT DETECTED   Candida albicans NOT DETECTED NOT DETECTED   Candida auris NOT DETECTED NOT DETECTED   Candida glabrata NOT DETECTED NOT DETECTED   Candida krusei NOT DETECTED NOT DETECTED   Candida parapsilosis NOT DETECTED NOT DETECTED   Candida tropicalis NOT DETECTED NOT DETECTED   Cryptococcus neoformans/gattii NOT DETECTED NOT DETECTED   Methicillin resistance mecA/C NOT DETECTED NOT DETECTED    Comment: Performed at [facility], [address], [place], [place] [address]  I-Stat Lactic Acid, ED     Status: None   Collection Time: 09/25/23 10:28 PM  Result Value Ref Range   Lactic Acid, Venous 1.9 0.5 - 1.9 mmol/L  Comprehensive metabolic panel     Status: Abnormal   Collection Time: 09/25/23 10:51 PM  Result Value Ref Range   Sodium 135 135 - 145 mmol/L   Potassium 5.1 3.5 - 5.1 mmol/L    Comment: HEMOLYSIS AT THIS LEVEL MAY AFFECT RESULT   Chloride 105 98 - 111 mmol/L   CO2 21 (L) 22 - 32 mmol/L   Glucose, Bld 93 70 - 99 mg/dL    Comment: Glucose reference range applies only to samples taken after fasting for at least 8 hours.   BUN 23 (H) 6 - 20 mg/dL   Creatinine, Ser 9.16 0.44 - 1.00 mg/dL   Calcium  8.3 (L) 8.9 - 10.3 mg/dL   Total Protein 7.2 6.5 - 8.1 g/dL   Albumin  2.3 (L) 3.5 - 5.0 g/dL   AST 822 (H) 15 - 41 U/L    Comment: HEMOLYSIS AT THIS LEVEL MAY  AFFECT RESULT   ALT 66 (H) 0 - 44 U/L   Alkaline  Phosphatase 1,331 (H) 38 - 126 U/L   Total Bilirubin 2.7 (H) 0.0 - 1.2 mg/dL   GFR, Estimated >39 >39 mL/min    Comment: (NOTE) Calculated using the CKD-EPI Creatinine Equation (2021)    Anion gap 9 5 - 15    Comment: Performed at [facility], [address] [place], [place] [address]  CBC with Differential/Platelet     Status: Abnormal   Collection Time: 09/26/23 12:00 AM  Result Value Ref Range   WBC 4.9 4.0 - 10.5 K/uL   RBC 3.45 (L) 3.87 - 5.11 MIL/uL   Hemoglobin 8.8 (L) 12.0 - 15.0 g/dL   HCT 71.9 (L) 63.9 - 53.9 %   MCV 81.2 80.0 - 100.0 fL   MCH 25.5 (L) 26.0 - 34.0 pg   MCHC 31.4 30.0 - 36.0 g/dL   RDW 80.5 (H) 88.4 - 84.4 %   Platelets 145 (L) 150 - 400 K/uL   nRBC 0.0 0.0 - 0.2 %   Neutrophils Relative % 64 %   Neutro Abs 3.1 1.7 - 7.7 K/uL   Lymphocytes Relative 29 %   Lymphs Abs 1.4 0.7 - 4.0 K/uL   Monocytes Relative 5 %   Monocytes Absolute 0.3 0.1 - 1.0 K/uL   Eosinophils Relative 0 %   Eosinophils Absolute 0.0 0.0 - 0.5 K/uL   Basophils Relative 0 %   Basophils Absolute 0.0 0.0 - 0.1 K/uL   Immature Granulocytes 2 %   Abs Immature Granulocytes 0.09 (H) 0.00 - 0.07 K/uL    Comment: Performed at [facility], [address] [place], [place] [address]  Acetaminophen  level     Status: Abnormal   Collection Time: 09/26/23  4:27 AM  Result Value Ref Range   Acetaminophen  (Tylenol ), Serum <10 (L) 10 - 30 ug/mL    Comment: (NOTE) Toxic concentrations can be more effectively related to post dose interval; > 200, > 100, and > 50 ug/mL serum concentrations correspond to toxic concentrations at 4, 8, and 12 hours post dose, respectively.  Performed at [facility], [address] [place], [place] [address]   POC CBG, ED     Status: None   Collection Time: 09/26/23  4:52 AM  Result Value Ref Range   Glucose-Capillary 84 70 - 99 mg/dL    Comment: Glucose reference range applies only to samples taken  after fasting for at least 8 hours.  HIV Antibody (routine testing w rflx)     Status: Abnormal   Collection Time: 09/26/23  9:21 AM  Result Value Ref Range   HIV Screen 4th Generation wRfx Reactive (A) Non Reactive    Comment: (NOTE) Reactive result does not distinguish HIV-1 p24 antigen, HIV-1  antibody, HIV-2 antibody, and HIV-1 group O antibody. Results  reactive by HIV Antigen/Antibody EIA must be confirmed. Sent for  confirmation. Performed at [facility], [address], [place], [place] [address]   Magnesium      Status: None   Collection Time: 09/26/23  9:21 AM  Result Value Ref Range   Magnesium  2.2 1.7 - 2.4 mg/dL    Comment: Performed at [facility], [address], [place], [place] [address]  Phosphorus     Status: None   Collection Time: 09/26/23  9:21 AM  Result Value Ref Range   Phosphorus 3.0 2.5 - 4.6 mg/dL    Comment:  Performed at [facility], [address] [place], [place] [address]  Comprehensive metabolic panel     Status: Abnormal   Collection Time: 09/26/23  9:21 AM  Result Value Ref Range   Sodium 134 (L) 135 - 145 mmol/L   Potassium 4.6 3.5 - 5.1 mmol/L   Chloride 106 98 - 111 mmol/L   CO2 21 (L) 22 - 32 mmol/L   Glucose, Bld 86 70 - 99 mg/dL    Comment: Glucose reference range applies only to samples taken after fasting for at least 8 hours.   BUN 22 (H) 6 - 20 mg/dL   Creatinine, Ser 9.15 0.44 - 1.00 mg/dL   Calcium  7.6 (L) 8.9 - 10.3 mg/dL   Total Protein 6.3 (L) 6.5 - 8.1 g/dL   Albumin  2.0 (L) 3.5 - 5.0 g/dL   AST 881 (H) 15 - 41 U/L   ALT 49 (H) 0 - 44 U/L   Alkaline Phosphatase 1,061 (H) 38 - 126 U/L   Total Bilirubin 2.7 (H) 0.0 - 1.2 mg/dL   GFR, Estimated >39 >39 mL/min    Comment: (NOTE) Calculated using the CKD-EPI Creatinine Equation (2021)    Anion gap 7 5 - 15    Comment: Performed at [facility], [address] [place], [place] [address]  Ethanol     Status:  None   Collection Time: 09/26/23  9:45 AM  Result Value Ref Range   Alcohol , Ethyl (B) <15 <15 mg/dL    Comment: (NOTE) For medical purposes only. Performed at [facility], [address], [place], [place] [address]   CBC     Status: Abnormal   Collection Time: 09/26/23  3:45 PM  Result Value Ref Range   WBC 5.3 4.0 - 10.5 K/uL   RBC 4.01 3.87 - 5.11 MIL/uL   Hemoglobin 10.1 (L) 12.0 - 15.0 g/dL   HCT 66.7 (L) 63.9 - 53.9 %   MCV 82.8 80.0 - 100.0 fL   MCH 25.2 (L) 26.0 - 34.0 pg   MCHC 30.4 30.0 - 36.0 g/dL   RDW 79.7 (H) 88.4 - 84.4 %   Platelets 137 (L) 150 - 400 K/uL   nRBC 0.0 0.0 - 0.2 %    Comment: Performed at [facility], [address] [place], [place] [address]  Protime-INR     Status: None   Collection Time: 09/26/23  3:45 PM  Result Value Ref Range   Prothrombin Time 14.1 11.4 - 15.2 seconds   INR 1.0 0.8 - 1.2    Comment: (NOTE) INR goal varies based on device and disease states. Performed at [facility] [address], [place], [place] [address]   Urinalysis, Routine w reflex microscopic -Urine, Clean Catch     Status: Abnormal   Collection Time: 09/26/23  5:27 PM  Result Value Ref Range   Color, Urine AMBER (A) YELLOW    Comment: BIOCHEMICALS MAY BE AFFECTED BY COLOR   APPearance CLEAR CLEAR   Specific Gravity, Urine >1.046 (H) 1.005 - 1.030   pH 5.0 5.0 - 8.0   Glucose, UA NEGATIVE NEGATIVE mg/dL   Hgb urine dipstick SMALL (A) NEGATIVE   Bilirubin Urine NEGATIVE NEGATIVE   Ketones, ur NEGATIVE NEGATIVE mg/dL   Protein, ur 30 (A) NEGATIVE mg/dL   Nitrite POSITIVE (A) NEGATIVE   Leukocytes,Ua NEGATIVE NEGATIVE   RBC / HPF 11-20 0 - 5 RBC/hpf   WBC, UA 11-20 0 - 5  WBC/hpf   Bacteria, UA NONE SEEN NONE SEEN   Squamous Epithelial / HPF 0-5 0 - 5 /HPF    Comment: Performed at [facility], [address], [place], [place] [address]  Urine Drug Screen     Status: Abnormal    Collection Time: 09/26/23  5:27 PM  Result Value Ref Range   Opiates POSITIVE (A) NEGATIVE   Cocaine NEGATIVE NEGATIVE   Benzodiazepines NEGATIVE NEGATIVE   Amphetamines POSITIVE (A) NEGATIVE   Tetrahydrocannabinol NEGATIVE NEGATIVE   Barbiturates NEGATIVE NEGATIVE   Methadone Scn, Ur NEGATIVE NEGATIVE   Fentanyl  POSITIVE (A) NEGATIVE    Comment: (NOTE) Drug screen is for Medical Purposes only. Positive results are preliminary only. If confirmation is needed, notify lab within 5 days.  Drug Class                 Cutoff (ng/mL) Amphetamine and metabolites 1000 Barbiturate and metabolites 200 Benzodiazepine              200 Opiates and metabolites     300 Cocaine and metabolites     300 THC                         50 Fentanyl                     5 Methadone                   300  Trazodone  is metabolized in vivo to several metabolites,  including pharmacologically active m-CPP, which is excreted in the  urine.  Immunoassay screens for amphetamines and MDMA have potential  cross-reactivity with these compounds and may provide false positive  result.  Performed at [facility], [address] [place], [place] [address]   CBC     Status: Abnormal   Collection Time: 09/27/23  7:11 AM  Result Value Ref Range   WBC 6.1 4.0 - 10.5 K/uL   RBC 3.49 (L) 3.87 - 5.11 MIL/uL   Hemoglobin 8.8 (L) 12.0 - 15.0 g/dL   HCT 71.6 (L) 63.9 - 53.9 %   MCV 81.1 80.0 - 100.0 fL   MCH 25.2 (L) 26.0 - 34.0 pg   MCHC 31.1 30.0 - 36.0 g/dL   RDW 79.6 (H) 88.4 - 84.4 %   Platelets 157 150 - 400 K/uL   nRBC 0.0 0.0 - 0.2 %    Comment: Performed at [facility], [address] [place], [place] [address]  Comprehensive metabolic panel     Status: Abnormal   Collection Time: 09/27/23  7:11 AM  Result Value Ref Range   Sodium 133 (L) 135 - 145 mmol/L   Potassium 4.5 3.5 - 5.1 mmol/L   Chloride 104 98 - 111 mmol/L   CO2 20 (L) 22 - 32 mmol/L   Glucose, Bld 98  70 - 99 mg/dL    Comment: Glucose reference range applies only to samples taken after fasting for at least 8 hours.   BUN 18 6 - 20 mg/dL   Creatinine, Ser 9.20 0.44 - 1.00 mg/dL   Calcium  7.9 (L) 8.9 - 10.3 mg/dL   Total Protein 6.7 6.5 - 8.1 g/dL   Albumin  2.1 (L) 3.5 - 5.0 g/dL   AST 848 (H) 15 - 41 U/L   ALT 52 (H) 0 - 44 U/L   Alkaline Phosphatase 1,122 (H) 38 - 126 U/L   Total  Bilirubin 2.9 (H) 0.0 - 1.2 mg/dL   GFR, Estimated >39 >39 mL/min    Comment: (NOTE) Calculated using the CKD-EPI Creatinine Equation (2021)    Anion gap 9 5 - 15    Comment: Performed at [facility], [address], [place], [place] [address]  Pro Brain natriuretic peptide     Status: Abnormal   Collection Time: 09/27/23  7:11 AM  Result Value Ref Range   Pro Brain Natriuretic Peptide 2,209.0 (H) <300.0 pg/mL    Comment: (NOTE) Age Group        Cut-Points    Interpretation  < 50 years     450 pg/mL       NT-proBNP > 450 pg/mL indicates                                ADHF is likely              50 to 75 years  900 pg/mL      NT-proBNP > 900 pg/mL indicates          ADHF is likely  > 75 years      1800 pg/mL     NT-proBNP > 1800 pg/mL indicates          ADHF is likely                           All ages    Results between       Indeterminate. Further clinical             300 and the cut-   information is needed to determine            point for age group   if ADHF is present.                                                             Elecsys proBNP II/ Elecsys proBNP II STAT           Cut-Point                       Interpretation  300 pg/mL                    NT-proBNP <300pg/mL indicates                             ADHF is not likely  Performed at [facility], [address], [place], [place] [address]   Procalcitonin     Status: None   Collection Time: 09/27/23  7:11 AM  Result Value Ref Range   Procalcitonin 0.24 ng/mL    Comment: (NOTE)    Sepsis PCT Algorithm          Lower Respiratory Tract Infection                                         PCT Algorithm -----------------------------------------------------------------  <0.5 ng/mL                    <  0.10 ng/mL  Associated with low           Antibiotic therapy strongly   risk for progression          discouraged. Indicates absence   to severe sepsis              of bacteria infection  and/or septic shock             --------------------------------------------------------------  0.5-2.0 ng/mL                 0.10-0.25 ng/mL  Recommended to retest         Antibiotic therapy discouraged.  PCT within 6-24 hours         Bacterial infection unlikely  ------------------------------------------------------------  >2 ng/mL                      0.26-0.50 ng/mL  Associated with high risk     Antibiotic therapy encouraged.  for progression to severe     Bacterial infection possible  sepsis/and or septic shock    ------------------------------                                 >0.50 ng/mL                                Antibiotic therapy strongly                                 encouraged.                                Suggestive of presence of                                 bacterial infection.                                 -------------------------------------------------------------------  < or = 0.50 ng/mL OR          < or = 0.25 OR 80% decrease in PCT  80% decrease in PCT           Antibiotic therapy   Antibiotic therapy may        may be discontinued  be discontinued                                 Performed at [facility], [address], [place], [place] [address]     VAS US  LOWER EXTREMITY VENOUS (DVT) Result Date: 09/26/2023  Lower Venous DVT Study Patient Name:  [name] [name]  Date of Exam:   09/26/2023 Medical Rec #: [id number]      Accession #:    [id number] Date of Birth: [birth year]      Patient Gender: F Patient Age:   73 years Exam Location:   [facility] Procedure:      VAS US  LOWER EXTREMITY VENOUS (DVT) Referring Phys: DAVID ORTIZ --------------------------------------------------------------------------------  Indications: Swelling, Pain, Edema, and history of necrotizing fascitis.  Limitations: Body habitus and poor ultrasound/tissue interface.  Comparison Study: No prior exam. Performing Technologist: Edilia Elden Appl  Examination Guidelines: A complete evaluation includes B-mode imaging, spectral Doppler, color Doppler, and power Doppler as needed of all accessible portions of each vessel. Bilateral testing is considered an integral part of a complete examination. Limited examinations for reoccurring indications may be performed as noted. The reflux portion of the exam is performed with the patient in reverse Trendelenburg.  +---------+---------------+---------+-----------+----------+-------------------+ RIGHT    CompressibilityPhasicitySpontaneityPropertiesThrombus Aging      +---------+---------------+---------+-----------+----------+-------------------+ CFV      Full           Yes      Yes                                      +---------+---------------+---------+-----------+----------+-------------------+ SFJ      Full           Yes      Yes                                      +---------+---------------+---------+-----------+----------+-------------------+ FV Prox  Full                                                             +---------+---------------+---------+-----------+----------+-------------------+ FV Mid   Full                                                             +---------+---------------+---------+-----------+----------+-------------------+ FV DistalFull                                                             +---------+---------------+---------+-----------+----------+-------------------+ PFV      Full                                                              +---------+---------------+---------+-----------+----------+-------------------+ POP      Full           Yes      Yes                                      +---------+---------------+---------+-----------+----------+-------------------+ PTV                                                   Not well visualized +---------+---------------+---------+-----------+----------+-------------------+ PERO     Full                                                             +---------+---------------+---------+-----------+----------+-------------------+  Right posterior tibial veins not visualized on this exam.  +---------+---------------+---------+-----------+----------+--------------+ LEFT     CompressibilityPhasicitySpontaneityPropertiesThrombus Aging +---------+---------------+---------+-----------+----------+--------------+ CFV      Full           Yes      Yes                                 +---------+---------------+---------+-----------+----------+--------------+ SFJ      Full           Yes      Yes                                 +---------+---------------+---------+-----------+----------+--------------+ FV Prox  Full                                                        +---------+---------------+---------+-----------+----------+--------------+ FV Mid   Full                                                        +---------+---------------+---------+-----------+----------+--------------+ FV DistalFull                                                        +---------+---------------+---------+-----------+----------+--------------+ PFV      Full                                                        +---------+---------------+---------+-----------+----------+--------------+ POP      Full           Yes      Yes                                 +---------+---------------+---------+-----------+----------+--------------+ PTV      Full                                                         +---------+---------------+---------+-----------+----------+--------------+ PERO     Full                                                        +---------+---------------+---------+-----------+----------+--------------+    Summary: RIGHT: - There is no evidence of deep vein thrombosis in the lower extremity. However, portions of this examination were limited- see technologist comments above.  - No cystic structure found in the popliteal fossa.  LEFT: -  There is no evidence of deep vein thrombosis in the lower extremity.  - No cystic structure found in the popliteal fossa.  *See table(s) above for measurements and observations.    Preliminary    CT Angio Chest Pulmonary Embolism (PE) W or WO Contrast Result Date: 09/26/2023 CLINICAL DATA:  Shortness of breath. Acute bilateral lower extremity swelling, fever. EXAM: CT ANGIOGRAPHY CHEST WITH CONTRAST TECHNIQUE: Multidetector CT imaging of the chest was performed using the standard protocol during bolus administration of intravenous contrast. Multiplanar CT image reconstructions and MIPs were obtained to evaluate the vascular anatomy. RADIATION DOSE REDUCTION: This exam was performed according to the departmental dose-optimization program which includes automated exposure control, adjustment of the mA and/or kV according to patient size and/or use of iterative reconstruction technique. CONTRAST:  75mL OMNIPAQUE  IOHEXOL  350 MG/ML SOLN COMPARISON:  November 02, 2018. FINDINGS: Cardiovascular: Satisfactory opacification of the pulmonary arteries to the segmental level. No evidence of pulmonary embolism. Normal heart size. No pericardial effusion. Mediastinum/Nodes: No significant mediastinal adenopathy is noted. Mildly enlarged bilateral axillary lymph nodes are noted which most likely are inflammatory or reactive in etiology. Thyroid gland, trachea, and esophagus demonstrate no significant findings. Lungs/Pleura: No  pneumothorax or pleural effusion is noted. Diffuse patchy and interstitial opacities are noted throughout both lungs concerning for edema or possibly multifocal pneumonia. Upper Abdomen: Hepatic steatosis. Musculoskeletal: No chest wall abnormality. No acute or significant osseous findings. Review of the MIP images confirms the above findings. IMPRESSION: 1. No definite evidence of pulmonary embolus. 2. Diffuse patchy and interstitial opacities are noted throughout both lungs concerning for edema or possibly multifocal pneumonia. 3. Hepatic steatosis. Electronically Signed   By: Lynwood Landy Raddle M.D.   On: 09/26/2023 14:01   US  Abdomen Limited RUQ (LIVER/GB) Result Date: 09/26/2023 EXAM: Right Upper Quadrant Abdominal Ultrasound TECHNIQUE: Real-time ultrasonography of the right upper quadrant of the abdomen was performed. COMPARISON: None. CLINICAL HISTORY: Right flank discomfort [id number]. FINDINGS: LIVER: The liver demonstrates heterogeneous and increased echogenicity. No intrahepatic biliary ductal dilatation. No mass. BILIARY SYSTEM: No evidence of pericholecystic fluid or wall thickening. No cholelithiasis. Positive sonographic Murphy's sign. Common bile duct mildly dilated measuring 7mm. OTHER: No right upper quadrant ascites. IMPRESSION: 1. Findings are equivocal for acute cholecystitis. A positive sonographic murphy sign was noted by the sonographer and there is mild dilation of the common bile duct. However, there is no cholelithiasis, gallbladder wall thickening, or pericholecystic fluid. Consider HIDA scan for further evaluation. Electronically signed by: Norman Gatlin MD 09/26/2023 02:53 AM EDT RP Workstation: [id number]   CT ABDOMEN PELVIS W CONTRAST Result Date: 09/26/2023 EXAM: CT ABDOMEN AND PELVIS WITH CONTRAST 09/26/2023 12:58:30 AM TECHNIQUE: CT of the abdomen and pelvis was performed with the administration of intravenous contrast, specifically 100mL of iohexol  (OMNIPAQUE ) 300 MG/ML solution.  Multiplanar reformatted images are provided for review. Automated exposure control, iterative reconstruction, and/or weight-based adjustment of the mA/kV was utilized to reduce the radiation dose to as low as reasonably achievable. COMPARISON: Apr 10, 2018 CLINICAL HISTORY: Abdominal pain, acute, nonlocalized. FINDINGS: LOWER CHEST: No acute abnormality. LIVER: Hepatic steatosis. GALLBLADDER AND BILE DUCTS: Gallbladder is unremarkable. No biliary ductal dilatation. SPLEEN: The spleen measures 17.0 cm in craniocaudal dimension and contains a hypoattenuating lesion in the posterior spleen, which is likely a benign cyst or hemangioma. Splenomegaly is present. PANCREAS: No acute abnormality. ADRENAL GLANDS: No acute abnormality. KIDNEYS, URETERS AND BLADDER: No stones in the kidneys or ureters. No hydronephrosis. No perinephric or periureteral stranding. Urinary bladder  is unremarkable. GI AND BOWEL: Stomach demonstrates no acute abnormality. There is no bowel obstruction. PERITONEUM AND RETROPERITONEUM: No ascites.  No free air. VASCULATURE: Aorta is normal in caliber. LYMPH NODES: No lymphadenopathy. REPRODUCTIVE ORGANS: No acute abnormality. BONES AND SOFT TISSUES: No acute osseous abnormality. Edema in the subcutaneous fat of the bilateral flanks with subcutaneous stranding and edema with skin thickening in the subcutaneous fat of the pannus. Additional right greater than left edema and stranding in the subcutaneous fat of the thighs. No focal soft tissue abnormality. IMPRESSION: 1. Nonspecific edema in the subcutaneous fat of the bilateral flanks, pannus, and thighs (right greater than left). Correlate for cellulitis. 2. Hepatic steatosis. 3. Splenomegaly with a hypoattenuating lesion in the posterior spleen, likely a benign cyst or hemangioma. Electronically signed by: Norman Gatlin MD 09/26/2023 01:13 AM EDT RP Workstation: [id number]   CT EXTREMITY LOWER LEFT W CONTRAST Result Date: 09/26/2023 CLINICAL DATA:   Osteomyelitis suspected, skin graft to left lower extremity. Swelling, fever. EXAM: CT OF THE LOWER LEFT EXTREMITY WITH CONTRAST TECHNIQUE: Multidetector CT imaging of the lower left extremity was performed according to the standard protocol following intravenous contrast administration. RADIATION DOSE REDUCTION: This exam was performed according to the departmental dose-optimization program which includes automated exposure control, adjustment of the mA and/or kV according to patient size and/or use of iterative reconstruction technique. CONTRAST: OMNIPAQUE  IOHEXOL  300 MG/ML  SOLN COMPARISON:  None Available. FINDINGS: Bones/Joint/Cartilage No acute bony abnormality. Specifically, no fracture, subluxation, or dislocation. No bone destruction to suggest osteomyelitis. Small joint effusion within the left knee. Ligaments Suboptimally assessed by CT. Muscles and Tendons Unremarkable Soft tissues Skin staples noted anteriorly within the left shin region presumably related to reported skin graft. Edema within the subcutaneous soft tissues could be postoperative or related to cellulitis. No focal fluid collection. IMPRESSION: No acute bony abnormality. No bone destruction to suggest osteomyelitis. Edema throughout the subcutaneous soft tissues in the left lower extremity from the distal thigh through the calf could be postoperative or related to cellulitis. Electronically Signed   By: Franky Crease M.D.   On: 09/26/2023 01:09   DG Tibia/Fibula Left Result Date: 09/25/2023 CLINICAL DATA:  Skin graft left lower extremity, swelling, fever EXAM: LEFT TIBIA AND FIBULA - 2 VIEW COMPARISON:  None Available. FINDINGS: Frontal and lateral views of the left lower leg are obtained. Multiple surgical staples are seen within the anterior proximal left lower leg consistent with history of skin graft surgery. There is diffuse subcutaneous edema. No subcutaneous gas. No acute or destructive bony abnormalities. IMPRESSION: 1.  Postsurgical changes from skin graft procedure. 2. Extensive subcutaneous edema throughout the visualized left lower leg, which could be postoperative or related to cellulitis. 3. No acute or destructive bony abnormality. Electronically Signed   By: Ozell Daring M.D.   On: 09/25/2023 22:52   DG Chest Port 1 View Result Date: 09/25/2023 CLINICAL DATA:  Short of breath, fever EXAM: PORTABLE CHEST 1 VIEW COMPARISON:  10/21/2019 FINDINGS: The heart size and mediastinal contours are within normal limits. Both lungs are clear. The visualized skeletal structures are unremarkable. IMPRESSION: No active disease. Electronically Signed   By: Ozell Daring M.D.   On: 09/25/2023 22:51    ROS negative except above Blood pressure 138/86, pulse (!) 120, temperature 99 F (37.2 C), temperature source Oral, resp. rate (!) 24, height 5' 5 (1.651 m), weight 109.1 kg, last menstrual period 08/10/2011, SpO2 96%. Physical Exam vital signs stable afebrile no acute distress abdomen is  soft nontender labs and x-rays reviewed hepatitis studies pending HIDA scan pending  Assessment/Plan: Multiple medical problems including questionable acute cholecystitis Plan: Await HIDA scan and hepatitis studies if HIDA scan positive keep n.p.o. and consult surgical team however if negative okay with me to have clear liquids and proceed with MRCP and will ask Dr. Kristie and Rollin to see tomorrow  [facility] 09/27/2023, 9:53 AM

## 2023-09-27 NOTE — Plan of Care (Signed)
  Problem: Clinical Measurements: Goal: Respiratory complications will improve Outcome: Progressing   Problem: Pain Managment: Goal: General experience of comfort will improve and/or be controlled Outcome: Progressing   Problem: Safety: Goal: Ability to remain free from injury will improve Outcome: Progressing   Problem: Skin Integrity: Goal: Risk for impaired skin integrity will decrease Outcome: Progressing

## 2023-09-27 NOTE — Consult Note (Addendum)
 Infectious Disease     Reason for Consult:Bacteremia    Referring Physician: Dr Jillian Date of Admission:  09/25/2023   Principal Problem:   Elevated LFTs Active Problems:   MDD (major depressive disorder), recurrent severe, without psychosis (HCC)   RUQ pain   Substance abuse (HCC)   Cellulitis of both lower extremities   Normocytic anemia   Protein-calorie malnutrition, severe (HCC)   Hyponatremia   Thrombocytopenia (HCC)   HPI: [name] [name] is a 43 y.o. female with a history of multiple medical problems including anxiety depression chronic back pain history of polysubstance abuse including cocaine alcohol  opiate use, history of spinal epidural abscess, septic emboli degenerative disc disease who presented to the emergency room with fevers and lower extremity swelling as well as right upper quadrant abdominal pain. In the ED she was afebrile initially but spiked to 101.7.  She was initially tachycardic and hypertensive.  Urine sample showed 11-20 white cells.  COVID flu RSV was negative.  She had elevated bilirubin And markedly elevated alk phos at 1331.  AST and ALT were also mildly elevated. white count was 4.9.  Hemoglobin was 8.5 and platelets were 145. Urine tox screen was positive for opiates amphetamines fentanyl .  Testing for HIV was reactive on rapid testing follow-up is pending Blood cultures are growing in 1 set Staph epidermidis.  Follow-up blood cultures are pending. She has had extensive imaging since admission including CT abdomen which showed nonspecific inflammation in the subcutaneous fat of the flanks and thighs and pannus and hepatic steatosis and splenomegaly.  Ultrasound of her abdomen showed equivocal findings for acute cholecystitis with mild common bile duct dilation.  CT lower extremity was negative for any bony abnormalities but there was edema throughout the subcutaneous soft tissues in the left lower extremity.  CT chest abdomen pelvis showed diffuse patchy and  interstitial opacities throughout both lungs concerning for edema or possible multifocal pneumonia and hepatic steatosis. She has been started on vancomycin  ceftriaxone  and Flagyl .  She had another fever this morning to 101.5. When seen by me today on the floor with the nurse present she is agitated and withdrawing from opioids.  She i tells me she has been actively injecting.  She also reports marked weight gain and swelling over the last few months and edema.   Past Medical History:  Diagnosis Date   Alcohol  use disorder 01/04/2012   Detox medically managed successfully during inpatient, adequate for discharge      Anxiety    klonipin for stress disorder   Chronic back pain    Cocaine abuse (HCC) 07/06/2013   Degenerative disc disease    Depression 05/22/2015   Endometriosis    chronic pain   GERD (gastroesophageal reflux disease)    protonix  evenings   Migraine    Septic embolism (HCC)    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   Urinary retention    Past Surgical History:  Procedure Laterality Date   ABDOMINAL HYSTERECTOMY     APPENDECTOMY     APPLICATION OF WOUND VAC Left 10/28/2019   Procedure: APPLICATION OF WOUND VAC;  Surgeon: Harden Jerona GAILS, MD;  Location: [facility] OR;  Service: Orthopedics;  Laterality: Left;   I & D EXTREMITY Left 10/22/2019   Procedure: IRRIGATION AND DEBRIDEMENT EXTREMITY; APPLICATION OF WOUND VAC;  Surgeon: Beverley Evalene BIRCH, MD;  Location: [facility] OR;  Service: Orthopedics;  Laterality: Left;   I & D EXTREMITY Left 10/26/2019   Procedure: DEBRIDEMENT LEFT TIBIA;  Surgeon: Harden Jerona GAILS,  MD;  Location: [facility] OR;  Service: Orthopedics;  Laterality: Left;  left   I & D EXTREMITY Left 10/28/2019   Procedure: REPEAT DEBRIDEMENT LEFT TIBIA;  Surgeon: Harden Jerona GAILS, MD;  Location: [facility] OR;  Service: Orthopedics;  Laterality: Left;   IR THORACENTESIS ASP PLEURAL SPACE W/IMG GUIDE  10/28/2018   LAMINECTOMY FOR CEREBROSPINAL FLUID LEAK N/A 10/31/2018   Procedure: Thoracic Wound  Exploration;  Surgeon: Gillie Duncans, MD;  Location: [facility] OR;  Service: Neurosurgery;  Laterality: N/A;   LAPAROSCOPIC ASSISTED VAGINAL HYSTERECTOMY  08/19/2011   Procedure: LAPAROSCOPIC ASSISTED VAGINAL HYSTERECTOMY;  Surgeon: Peggye Gull, MD;  Location: [facility] ORS;  Service: Gynecology;  Laterality: N/A;   TEE WITHOUT CARDIOVERSION N/A 10/25/2018   Procedure: TRANSESOPHAGEAL ECHOCARDIOGRAM (TEE);  Surgeon: Delford Maude BROCKS, MD;  Location: [facility] ENDOSCOPY;  Service: Cardiovascular;  Laterality: N/A;   THORACIC LAMINECTOMY FOR EPIDURAL ABSCESS N/A 10/20/2018   Procedure: THORACIC Eight, Thoracic nine LAMINECTOMY FOR EPIDURAL ABSCESS;  Surgeon: Joshua [name] RAMAN, MD;  Location: [facility] OR;  Service: Neurosurgery;  Laterality: N/A;   TONSILLECTOMY     TUBAL LIGATION     Social History   Tobacco Use   Smoking status: Every Day    Current packs/day: 1.00    Average packs/day: 1 pack/day for 14.0 years (14.0 ttl pk-yrs)    Types: Cigarettes   Smokeless tobacco: Never   Tobacco comments:    Smoking 1 PPD  Substance Use Topics   Alcohol  use: No   Drug use: Yes    Types: Heroin, IV    Comment: heroin   Family History  Problem Relation Age of Onset   Coronary artery disease Father    Coronary artery disease Maternal Grandmother    Breast cancer Mother    Cancer Mother     Allergies:  Allergies  Allergen Reactions   Epidural Tray [id number] [Nerve Block Tray] Other (See Comments)    Paralysis and severe pain in head/neck/shoulders.  No anaphylaxis.   Ibuprofen  Hives   Penicillins Hives     Tolerated Cephalosporin 10/22/2019.   Pt has tolerated cephalosporins on past admissions. Has patient had a PCN reaction causing immediate rash, facial/tongue/throat swelling, SOB or lightheadedness with hypotension: Yes Has patient had a PCN reaction causing severe rash involving mucus membranes or skin necrosis: No Has patient had a PCN reaction that required hospitalization No Has patient had a PCN reaction  occurring within the last 10 years: No If all of the above answers are NO, then may   Zofran  [Ondansetron  Hcl] Nausea And Vomiting    Current antibiotics: Antibiotics Given (last 72 hours)     Date/Time Action Medication Dose Rate   09/26/23 0023 New Bag/Given   ceFEPIme  (MAXIPIME ) 2 g in sodium chloride  0.9 % 100 mL IVPB 2 g 200 mL/hr   09/26/23 0122 New Bag/Given   vancomycin  (VANCOCIN ) IVPB 1000 mg/200 mL premix 1,000 mg 200 mL/hr   09/26/23 1306 New Bag/Given   cefTRIAXone  (ROCEPHIN ) 2 g in sodium chloride  0.9 % 100 mL IVPB 2 g 200 mL/hr   09/26/23 1307 New Bag/Given   vancomycin  (VANCOCIN ) IVPB 1000 mg/200 mL premix 1,000 mg 200 mL/hr   09/26/23 1307 New Bag/Given   metroNIDAZOLE  (FLAGYL ) IVPB 500 mg 500 mg 100 mL/hr   09/26/23 2351 New Bag/Given   metroNIDAZOLE  (FLAGYL ) IVPB 500 mg 500 mg 100 mL/hr       MEDICATIONS:  influenza vac split trivalent PF  0.5 mL Intramuscular Tomorrow-1000    Review of Systems -  11 systems reviewed and negative per HPI   OBJECTIVE: Temp:  [97.8 F (36.6 C)-101.5 F (38.6 C)] 101.5 F (38.6 C) (09/07 0830) Pulse Rate:  [107-127] 127 (09/07 0830) Resp:  [16-25] 22 (09/07 0830) BP: (103-142)/(58-92) 130/79 (09/07 0830) SpO2:  [91 %-98 %] 93 % (09/07 0830) Weight:  [109.1 kg] 109.1 kg (09/06 1441) Physical Exam  Constitutional: Somewhat agitated.  Speaking in pressured speech.  Obese.  Disheveled on O2 by nasal cannula  HENT: [place]/AT, PERRLA, no scleral icterus Mouth/Throat: Oropharynx is dry. Cardiovascular: Tacky but regular Pulmonary/Chest: Coarse breath sounds bilaterally Neck = supple, no nuchal rigidity Abdominal: Soft.  Obese.  Mild tender to palpation diffusely  lymphadenopathy: no cervical adenopathy. No axillary adenopathy Neurological: alert and oriented to person, place, and time.  Skin: Multiple track marks on both arms.  Left leg with healed site of prior necrotizing fasciitis and skin graft.  Right lower extremity with  edema and swelling. Psychiatric: a normal mood and affect.  behavior is normal.    LABS: Results for orders placed or performed during the hospital encounter of 09/25/23 (from the past 48 hours)  Urinalysis, w/ Reflex to Culture (Infection Suspected) -Urine, Clean Catch     Status: Abnormal   Collection Time: 09/25/23  9:54 PM  Result Value Ref Range   Specimen Source URINE, CLEAN CATCH    Color, Urine AMBER (A) YELLOW    Comment: BIOCHEMICALS MAY BE AFFECTED BY COLOR   APPearance HAZY (A) CLEAR   Specific Gravity, Urine 1.019 1.005 - 1.030   pH 6.0 5.0 - 8.0   Glucose, UA NEGATIVE NEGATIVE mg/dL   Hgb urine dipstick SMALL (A) NEGATIVE   Bilirubin Urine NEGATIVE NEGATIVE   Ketones, ur NEGATIVE NEGATIVE mg/dL   Protein, ur 30 (A) NEGATIVE mg/dL   Nitrite NEGATIVE NEGATIVE   Leukocytes,Ua TRACE (A) NEGATIVE   RBC / HPF 6-10 0 - 5 RBC/hpf   WBC, UA 11-20 0 - 5 WBC/hpf    Comment:        Reflex urine culture not performed if WBC <=10, OR if Squamous epithelial cells >5. If Squamous epithelial cells >5 suggest recollection.    Bacteria, UA MANY (A) NONE SEEN   Squamous Epithelial / HPF 0-5 0 - 5 /HPF   Mucus PRESENT     Comment: Performed at [facility], [address], [place], [place] [address]  Resp panel by RT-PCR (RSV, Flu A&B, Covid) Anterior Nasal Swab     Status: None   Collection Time: 09/25/23  9:54 PM   Specimen: Anterior Nasal Swab  Result Value Ref Range   SARS Coronavirus 2 by RT PCR NEGATIVE NEGATIVE    Comment: (NOTE) SARS-CoV-2 target nucleic acids are NOT DETECTED.  The SARS-CoV-2 RNA is generally detectable in upper respiratory specimens during the acute phase of infection. The lowest concentration of SARS-CoV-2 viral copies this assay can detect is 138 copies/mL. A negative result does not preclude SARS-Cov-2 infection and should not be used as the sole basis for treatment or other patient management decisions. A negative result may  occur with  improper specimen collection/handling, submission of specimen other than nasopharyngeal swab, presence of viral mutation(s) within the areas targeted by this assay, and inadequate number of viral copies(<138 copies/mL). A negative result must be combined with clinical observations, patient history, and epidemiological information. The expected result is Negative.  Fact Sheet for Patients:  BloggerCourse.com  Fact Sheet for Healthcare Providers:  SeriousBroker.it  This test is no  t yet approved or cleared by the [place]  FDA and  has been authorized for detection and/or diagnosis of SARS-CoV-2 by FDA under an Emergency Use Authorization (EUA). This EUA will remain  in effect (meaning this test can be used) for the duration of the COVID-19 declaration under Section 564(b)(1) of the Act, 21 U.S.C.section 360bbb-3(b)(1), unless the authorization is terminated  or revoked sooner.       Influenza A by PCR NEGATIVE NEGATIVE   Influenza B by PCR NEGATIVE NEGATIVE    Comment: (NOTE) The Xpert Xpress SARS-CoV-2/FLU/RSV plus assay is intended as an aid in the diagnosis of influenza from Nasopharyngeal swab specimens and should not be used as a sole basis for treatment. Nasal washings and aspirates are unacceptable for Xpert Xpress SARS-CoV-2/FLU/RSV testing.  Fact Sheet for Patients: BloggerCourse.com  Fact Sheet for Healthcare Providers: SeriousBroker.it  This test is not yet approved or cleared by the [place]  FDA and has been authorized for detection and/or diagnosis of SARS-CoV-2 by FDA under an Emergency Use Authorization (EUA). This EUA will remain in effect (meaning this test can be used) for the duration of the COVID-19 declaration under Section 564(b)(1) of the Act, 21 U.S.C. section 360bbb-3(b)(1), unless the authorization is terminated or revoked.      Resp Syncytial Virus by PCR NEGATIVE NEGATIVE    Comment: (NOTE) Fact Sheet for Patients: BloggerCourse.com  Fact Sheet for Healthcare Providers: SeriousBroker.it  This test is not yet approved or cleared by the [place]  FDA and has been authorized for detection and/or diagnosis of SARS-CoV-2 by FDA under an Emergency Use Authorization (EUA). This EUA will remain in effect (meaning this test can be used) for the duration of the COVID-19 declaration under Section 564(b)(1) of the Act, 21 U.S.C. section 360bbb-3(b)(1), unless the authorization is terminated or revoked.  Performed at [facility], [address] [place], [place] [address]   Culture, blood (Routine X 2) w Reflex to ID Panel     Status: None (Preliminary result)   Collection Time: 09/25/23  9:54 PM   Specimen: BLOOD  Result Value Ref Range   Specimen Description      BLOOD BLOOD RIGHT ARM Performed at [facility], [address], [place], [place] [address]    Special Requests      BOTTLES DRAWN AEROBIC AND ANAEROBIC Blood Culture results may not be optimal due to an inadequate volume of blood received in culture bottles Performed at [facility], [address], [place], [place] [address]    Culture      NO GROWTH < 12 HOURS Performed at [facility], [address], [place], [place] [address]    Report Status PENDING   Culture, blood (Routine X 2) w Reflex to ID Panel     Status: None (Preliminary result)   Collection Time: 09/25/23  9:54 PM   Specimen: BLOOD LEFT ARM  Result Value Ref Range   Specimen Description      BLOOD LEFT ARM Performed at [facility], [address], [place], [place] [address]    Special Requests      BOTTLES DRAWN AEROBIC AND ANAEROBIC Blood Culture adequate volume Performed at [facility], [address] [place], [place] [address]     Culture  Setup Time      GRAM POSITIVE COCCI IN CLUSTERS ANAEROBIC BOTTLE ONLY CRITICAL RESULT CALLED TO, READ BACK BY AND VERIFIED WITH: PHARMD E JACKSON 09/26/2023 @  2338 BY AB Performed at [facility], [address] [place], [place] [address]    Culture GRAM POSITIVE COCCI    Report Status PENDING   Blood Culture ID Panel (Reflexed)     Status: Abnormal   Collection Time: 09/25/23  9:54 PM  Result Value Ref Range   Enterococcus faecalis NOT DETECTED NOT DETECTED   Enterococcus Faecium NOT DETECTED NOT DETECTED   Listeria monocytogenes NOT DETECTED NOT DETECTED   Staphylococcus species DETECTED (A) NOT DETECTED    Comment: CRITICAL RESULT CALLED TO, READ BACK BY AND VERIFIED WITH: PHARMD E JACKSON 09/26/2023 @ 2338 BY AB    Staphylococcus aureus (BCID) NOT DETECTED NOT DETECTED   Staphylococcus epidermidis DETECTED (A) NOT DETECTED    Comment: CRITICAL RESULT CALLED TO, READ BACK BY AND VERIFIED WITH: PHARMD E JACKSON 09/26/2023 @ 2338 BY AB    Staphylococcus lugdunensis NOT DETECTED NOT DETECTED   Streptococcus species NOT DETECTED NOT DETECTED   Streptococcus agalactiae NOT DETECTED NOT DETECTED   Streptococcus pneumoniae NOT DETECTED NOT DETECTED   Streptococcus pyogenes NOT DETECTED NOT DETECTED   A.calcoaceticus-baumannii NOT DETECTED NOT DETECTED   Bacteroides fragilis NOT DETECTED NOT DETECTED   Enterobacterales NOT DETECTED NOT DETECTED   Enterobacter cloacae complex NOT DETECTED NOT DETECTED   Escherichia coli NOT DETECTED NOT DETECTED   Klebsiella aerogenes NOT DETECTED NOT DETECTED   Klebsiella oxytoca NOT DETECTED NOT DETECTED   Klebsiella pneumoniae NOT DETECTED NOT DETECTED   Proteus species NOT DETECTED NOT DETECTED   Salmonella species NOT DETECTED NOT DETECTED   Serratia marcescens NOT DETECTED NOT DETECTED   Haemophilus influenzae NOT DETECTED NOT DETECTED   Neisseria meningitidis NOT DETECTED NOT DETECTED   Pseudomonas aeruginosa NOT DETECTED NOT  DETECTED   Stenotrophomonas maltophilia NOT DETECTED NOT DETECTED   Candida albicans NOT DETECTED NOT DETECTED   Candida auris NOT DETECTED NOT DETECTED   Candida glabrata NOT DETECTED NOT DETECTED   Candida krusei NOT DETECTED NOT DETECTED   Candida parapsilosis NOT DETECTED NOT DETECTED   Candida tropicalis NOT DETECTED NOT DETECTED   Cryptococcus neoformans/gattii NOT DETECTED NOT DETECTED   Methicillin resistance mecA/C NOT DETECTED NOT DETECTED    Comment: Performed at [facility], [address], [place], [place] [address]  I-Stat Lactic Acid, ED     Status: None   Collection Time: 09/25/23 10:28 PM  Result Value Ref Range   Lactic Acid, Venous 1.9 0.5 - 1.9 mmol/L  Comprehensive metabolic panel     Status: Abnormal   Collection Time: 09/25/23 10:51 PM  Result Value Ref Range   Sodium 135 135 - 145 mmol/L   Potassium 5.1 3.5 - 5.1 mmol/L    Comment: HEMOLYSIS AT THIS LEVEL MAY AFFECT RESULT   Chloride 105 98 - 111 mmol/L   CO2 21 (L) 22 - 32 mmol/L   Glucose, Bld 93 70 - 99 mg/dL    Comment: Glucose reference range applies only to samples taken after fasting for at least 8 hours.   BUN 23 (H) 6 - 20 mg/dL   Creatinine, Ser 9.16 0.44 - 1.00 mg/dL   Calcium  8.3 (L) 8.9 - 10.3 mg/dL   Total Protein 7.2 6.5 - 8.1 g/dL   Albumin  2.3 (L) 3.5 - 5.0 g/dL   AST 822 (H) 15 - 41 U/L    Comment: HEMOLYSIS AT THIS LEVEL MAY AFFECT RESULT   ALT 66 (H) 0 - 44 U/L   Alkaline Phosphatase 1,331 (H) 38 -  126 U/L   Total Bilirubin 2.7 (H) 0.0 - 1.2 mg/dL   GFR, Estimated >39 >39 mL/min    Comment: (NOTE) Calculated using the CKD-EPI Creatinine Equation (2021)    Anion gap 9 5 - 15    Comment: Performed at [facility], [address], [place], [place] [address]  CBC with Differential/Platelet     Status: Abnormal   Collection Time: 09/26/23 12:00 AM  Result Value Ref Range   WBC 4.9 4.0 - 10.5 K/uL   RBC 3.45 (L) 3.87 - 5.11 MIL/uL   Hemoglobin 8.8 (L) 12.0 -  15.0 g/dL   HCT 71.9 (L) 63.9 - 53.9 %   MCV 81.2 80.0 - 100.0 fL   MCH 25.5 (L) 26.0 - 34.0 pg   MCHC 31.4 30.0 - 36.0 g/dL   RDW 80.5 (H) 88.4 - 84.4 %   Platelets 145 (L) 150 - 400 K/uL   nRBC 0.0 0.0 - 0.2 %   Neutrophils Relative % 64 %   Neutro Abs 3.1 1.7 - 7.7 K/uL   Lymphocytes Relative 29 %   Lymphs Abs 1.4 0.7 - 4.0 K/uL   Monocytes Relative 5 %   Monocytes Absolute 0.3 0.1 - 1.0 K/uL   Eosinophils Relative 0 %   Eosinophils Absolute 0.0 0.0 - 0.5 K/uL   Basophils Relative 0 %   Basophils Absolute 0.0 0.0 - 0.1 K/uL   Immature Granulocytes 2 %   Abs Immature Granulocytes 0.09 (H) 0.00 - 0.07 K/uL    Comment: Performed at [facility], [address] [place] [address], [place], [place] [address]  Acetaminophen  level     Status: Abnormal   Collection Time: 09/26/23  4:27 AM  Result Value Ref Range   Acetaminophen  (Tylenol ), Serum <10 (L) 10 - 30 ug/mL    Comment: (NOTE) Toxic concentrations can be more effectively related to post dose interval; > 200, > 100, and > 50 ug/mL serum concentrations correspond to toxic concentrations at 4, 8, and 12 hours post dose, respectively.  Performed at [facility], [address] [place], [place] [address]   POC CBG, ED     Status: None   Collection Time: 09/26/23  4:52 AM  Result Value Ref Range   Glucose-Capillary 84 70 - 99 mg/dL    Comment: Glucose reference range applies only to samples taken after fasting for at least 8 hours.  HIV Antibody (routine testing w rflx)     Status: Abnormal   Collection Time: 09/26/23  9:21 AM  Result Value Ref Range   HIV Screen 4th Generation wRfx Reactive (A) Non Reactive    Comment: (NOTE) Reactive result does not distinguish HIV-1 p24 antigen, HIV-1  antibody, HIV-2 antibody, and HIV-1 group O antibody. Results  reactive by HIV Antigen/Antibody EIA must be confirmed. Sent for  confirmation. Performed at [facility], [address], [place],  [place] [address]   Magnesium      Status: None   Collection Time: 09/26/23  9:21 AM  Result Value Ref Range   Magnesium  2.2 1.7 - 2.4 mg/dL    Comment: Performed at [facility], [address], [place], [place] [address]  Phosphorus     Status: None   Collection Time: 09/26/23  9:21 AM  Result Value Ref Range   Phosphorus 3.0 2.5 - 4.6 mg/dL    Comment: Performed at [facility], [address], [place], [place] [address]  Comprehensive metabolic panel  Status: Abnormal   Collection Time: 09/26/23  9:21 AM  Result Value Ref Range   Sodium 134 (L) 135 - 145 mmol/L   Potassium 4.6 3.5 - 5.1 mmol/L   Chloride 106 98 - 111 mmol/L   CO2 21 (L) 22 - 32 mmol/L   Glucose, Bld 86 70 - 99 mg/dL    Comment: Glucose reference range applies only to samples taken after fasting for at least 8 hours.   BUN 22 (H) 6 - 20 mg/dL   Creatinine, Ser 9.15 0.44 - 1.00 mg/dL   Calcium  7.6 (L) 8.9 - 10.3 mg/dL   Total Protein 6.3 (L) 6.5 - 8.1 g/dL   Albumin  2.0 (L) 3.5 - 5.0 g/dL   AST 881 (H) 15 - 41 U/L   ALT 49 (H) 0 - 44 U/L   Alkaline Phosphatase 1,061 (H) 38 - 126 U/L   Total Bilirubin 2.7 (H) 0.0 - 1.2 mg/dL   GFR, Estimated >39 >39 mL/min    Comment: (NOTE) Calculated using the CKD-EPI Creatinine Equation (2021)    Anion gap 7 5 - 15    Comment: Performed at [facility], [address], [place], [place] [address]  Ethanol     Status: None   Collection Time: 09/26/23  9:45 AM  Result Value Ref Range   Alcohol , Ethyl (B) <15 <15 mg/dL    Comment: (NOTE) For medical purposes only. Performed at [facility], [address], [place], [place] [address]   CBC     Status: Abnormal   Collection Time: 09/26/23  3:45 PM  Result Value Ref Range   WBC 5.3 4.0 - 10.5 K/uL   RBC 4.01 3.87 - 5.11 MIL/uL   Hemoglobin 10.1 (L) 12.0 - 15.0 g/dL   HCT 66.7 (L) 63.9 - 53.9 %   MCV 82.8 80.0 - 100.0 fL   MCH 25.2 (L) 26.0 - 34.0  pg   MCHC 30.4 30.0 - 36.0 g/dL   RDW 79.7 (H) 88.4 - 84.4 %   Platelets 137 (L) 150 - 400 K/uL   nRBC 0.0 0.0 - 0.2 %    Comment: Performed at [facility], [address], [place], [place] [address]  Protime-INR     Status: None   Collection Time: 09/26/23  3:45 PM  Result Value Ref Range   Prothrombin Time 14.1 11.4 - 15.2 seconds   INR 1.0 0.8 - 1.2    Comment: (NOTE) INR goal varies based on device and disease states. Performed at [facility], [address], [place], [place] [address]   Urinalysis, Routine w reflex microscopic -Urine, Clean Catch     Status: Abnormal   Collection Time: 09/26/23  5:27 PM  Result Value Ref Range   Color, Urine AMBER (A) YELLOW    Comment: BIOCHEMICALS MAY BE AFFECTED BY COLOR   APPearance CLEAR CLEAR   Specific Gravity, Urine >1.046 (H) 1.005 - 1.030   pH 5.0 5.0 - 8.0   Glucose, UA NEGATIVE NEGATIVE mg/dL   Hgb urine dipstick SMALL (A) NEGATIVE   Bilirubin Urine NEGATIVE NEGATIVE   Ketones, ur NEGATIVE NEGATIVE mg/dL   Protein, ur 30 (A) NEGATIVE mg/dL   Nitrite POSITIVE (A) NEGATIVE   Leukocytes,Ua NEGATIVE NEGATIVE   RBC / HPF 11-20 0 - 5 RBC/hpf   WBC, UA 11-20 0 - 5 WBC/hpf   Bacteria, UA NONE SEEN NONE SEEN   Squamous Epithelial / HPF 0-5 0 - 5 /HPF  Comment: Performed at [facility], [address], [place], [place] [address]  Urine Drug Screen     Status: Abnormal   Collection Time: 09/26/23  5:27 PM  Result Value Ref Range   Opiates POSITIVE (A) NEGATIVE   Cocaine NEGATIVE NEGATIVE   Benzodiazepines NEGATIVE NEGATIVE   Amphetamines POSITIVE (A) NEGATIVE   Tetrahydrocannabinol NEGATIVE NEGATIVE   Barbiturates NEGATIVE NEGATIVE   Methadone Scn, Ur NEGATIVE NEGATIVE   Fentanyl  POSITIVE (A) NEGATIVE    Comment: (NOTE) Drug screen is for Medical Purposes only. Positive results are preliminary only. If confirmation is needed, notify lab within 5 days.  Drug Class                  Cutoff (ng/mL) Amphetamine and metabolites 1000 Barbiturate and metabolites 200 Benzodiazepine              200 Opiates and metabolites     300 Cocaine and metabolites     300 THC                         50 Fentanyl                     5 Methadone                   300  Trazodone  is metabolized in vivo to several metabolites,  including pharmacologically active m-CPP, which is excreted in the  urine.  Immunoassay screens for amphetamines and MDMA have potential  cross-reactivity with these compounds and may provide false positive  result.  Performed at [facility], [address] [place], [place] [address]   CBC     Status: Abnormal   Collection Time: 09/27/23  7:11 AM  Result Value Ref Range   WBC 6.1 4.0 - 10.5 K/uL   RBC 3.49 (L) 3.87 - 5.11 MIL/uL   Hemoglobin 8.8 (L) 12.0 - 15.0 g/dL   HCT 71.6 (L) 63.9 - 53.9 %   MCV 81.1 80.0 - 100.0 fL   MCH 25.2 (L) 26.0 - 34.0 pg   MCHC 31.1 30.0 - 36.0 g/dL   RDW 79.6 (H) 88.4 - 84.4 %   Platelets 157 150 - 400 K/uL   nRBC 0.0 0.0 - 0.2 %    Comment: Performed at [facility], [address] [place], [place] [address]   No components found for: ESR, C REACTIVE PROTEIN MICRO: Recent Results (from the past 720 hours)  Resp panel by RT-PCR (RSV, Flu A&B, Covid) Anterior Nasal Swab     Status: None   Collection Time: 09/25/23  9:54 PM   Specimen: Anterior Nasal Swab  Result Value Ref Range Status   SARS Coronavirus 2 by RT PCR NEGATIVE NEGATIVE Final    Comment: (NOTE) SARS-CoV-2 target nucleic acids are NOT DETECTED.  The SARS-CoV-2 RNA is generally detectable in upper respiratory specimens during the acute phase of infection. The lowest concentration of SARS-CoV-2 viral copies this assay can detect is 138 copies/mL. A negative result does not preclude SARS-Cov-2 infection and should not be used as the sole basis for treatment or other patient management decisions. A  negative result may occur with  improper specimen collection/handling, submission of specimen other than nasopharyngeal swab, presence of viral mutation(s) within the areas targeted by this assay, and inadequate number of viral copies(<138 copies/mL). A negative result must be combined with clinical observations, patient history, and epidemiological information.  The expected result is Negative.  Fact Sheet for Patients:  BloggerCourse.com  Fact Sheet for Healthcare Providers:  SeriousBroker.it  This test is no t yet approved or cleared by the [place]  FDA and  has been authorized for detection and/or diagnosis of SARS-CoV-2 by FDA under an Emergency Use Authorization (EUA). This EUA will remain  in effect (meaning this test can be used) for the duration of the COVID-19 declaration under Section 564(b)(1) of the Act, 21 U.S.C.section 360bbb-3(b)(1), unless the authorization is terminated  or revoked sooner.       Influenza A by PCR NEGATIVE NEGATIVE Final   Influenza B by PCR NEGATIVE NEGATIVE Final    Comment: (NOTE) The Xpert Xpress SARS-CoV-2/FLU/RSV plus assay is intended as an aid in the diagnosis of influenza from Nasopharyngeal swab specimens and should not be used as a sole basis for treatment. Nasal washings and aspirates are unacceptable for Xpert Xpress SARS-CoV-2/FLU/RSV testing.  Fact Sheet for Patients: BloggerCourse.com  Fact Sheet for Healthcare Providers: SeriousBroker.it  This test is not yet approved or cleared by the [place]  FDA and has been authorized for detection and/or diagnosis of SARS-CoV-2 by FDA under an Emergency Use Authorization (EUA). This EUA will remain in effect (meaning this test can be used) for the duration of the COVID-19 declaration under Section 564(b)(1) of the Act, 21 U.S.C. section 360bbb-3(b)(1), unless the authorization  is terminated or revoked.     Resp Syncytial Virus by PCR NEGATIVE NEGATIVE Final    Comment: (NOTE) Fact Sheet for Patients: BloggerCourse.com  Fact Sheet for Healthcare Providers: SeriousBroker.it  This test is not yet approved or cleared by the [place]  FDA and has been authorized for detection and/or diagnosis of SARS-CoV-2 by FDA under an Emergency Use Authorization (EUA). This EUA will remain in effect (meaning this test can be used) for the duration of the COVID-19 declaration under Section 564(b)(1) of the Act, 21 U.S.C. section 360bbb-3(b)(1), unless the authorization is terminated or revoked.  Performed at [facility], [address] [place], [place] [address]   Culture, blood (Routine X 2) w Reflex to ID Panel     Status: None (Preliminary result)   Collection Time: 09/25/23  9:54 PM   Specimen: BLOOD  Result Value Ref Range Status   Specimen Description   Final    BLOOD BLOOD RIGHT ARM Performed at [facility], [address], [place], [place] [address]    Special Requests   Final    BOTTLES DRAWN AEROBIC AND ANAEROBIC Blood Culture results may not be optimal due to an inadequate volume of blood received in culture bottles Performed at [facility], [address] [place], [place] [address]    Culture   Final    NO GROWTH < 12 HOURS Performed at [facility], [address], [place], [place] [address]    Report Status PENDING  Incomplete  Culture, blood (Routine X 2) w Reflex to ID Panel     Status: None (Preliminary result)   Collection Time: 09/25/23  9:54 PM   Specimen: BLOOD LEFT ARM  Result Value Ref Range Status   Specimen Description   Final    BLOOD LEFT ARM Performed at [facility], [address] [place], [place] [address]    Special Requests   Final    BOTTLES DRAWN AEROBIC AND ANAEROBIC Blood Culture adequate  volume Performed at [facility], [address] [place], [place]  [address]    Culture  Setup Time   Final    GRAM POSITIVE COCCI IN CLUSTERS ANAEROBIC BOTTLE ONLY CRITICAL RESULT CALLED TO, READ BACK BY AND VERIFIED WITH: PHARMD E JACKSON 09/26/2023 @ 2338 BY AB Performed at [facility], [address] [place], [place] [address]    Culture GRAM POSITIVE COCCI  Final   Report Status PENDING  Incomplete  Blood Culture ID Panel (Reflexed)     Status: Abnormal   Collection Time: 09/25/23  9:54 PM  Result Value Ref Range Status   Enterococcus faecalis NOT DETECTED NOT DETECTED Final   Enterococcus Faecium NOT DETECTED NOT DETECTED Final   Listeria monocytogenes NOT DETECTED NOT DETECTED Final   Staphylococcus species DETECTED (A) NOT DETECTED Final    Comment: CRITICAL RESULT CALLED TO, READ BACK BY AND VERIFIED WITH: PHARMD E JACKSON 09/26/2023 @ 2338 BY AB    Staphylococcus aureus (BCID) NOT DETECTED NOT DETECTED Final   Staphylococcus epidermidis DETECTED (A) NOT DETECTED Final    Comment: CRITICAL RESULT CALLED TO, READ BACK BY AND VERIFIED WITH: PHARMD E JACKSON 09/26/2023 @ 2338 BY AB    Staphylococcus lugdunensis NOT DETECTED NOT DETECTED Final   Streptococcus species NOT DETECTED NOT DETECTED Final   Streptococcus agalactiae NOT DETECTED NOT DETECTED Final   Streptococcus pneumoniae NOT DETECTED NOT DETECTED Final   Streptococcus pyogenes NOT DETECTED NOT DETECTED Final   A.calcoaceticus-baumannii NOT DETECTED NOT DETECTED Final   Bacteroides fragilis NOT DETECTED NOT DETECTED Final   Enterobacterales NOT DETECTED NOT DETECTED Final   Enterobacter cloacae complex NOT DETECTED NOT DETECTED Final   Escherichia coli NOT DETECTED NOT DETECTED Final   Klebsiella aerogenes NOT DETECTED NOT DETECTED Final   Klebsiella oxytoca NOT DETECTED NOT DETECTED Final   Klebsiella pneumoniae NOT DETECTED NOT DETECTED Final   Proteus species NOT DETECTED NOT DETECTED  Final   Salmonella species NOT DETECTED NOT DETECTED Final   Serratia marcescens NOT DETECTED NOT DETECTED Final   Haemophilus influenzae NOT DETECTED NOT DETECTED Final   Neisseria meningitidis NOT DETECTED NOT DETECTED Final   Pseudomonas aeruginosa NOT DETECTED NOT DETECTED Final   Stenotrophomonas maltophilia NOT DETECTED NOT DETECTED Final   Candida albicans NOT DETECTED NOT DETECTED Final   Candida auris NOT DETECTED NOT DETECTED Final   Candida glabrata NOT DETECTED NOT DETECTED Final   Candida krusei NOT DETECTED NOT DETECTED Final   Candida parapsilosis NOT DETECTED NOT DETECTED Final   Candida tropicalis NOT DETECTED NOT DETECTED Final   Cryptococcus neoformans/gattii NOT DETECTED NOT DETECTED Final   Methicillin resistance mecA/C NOT DETECTED NOT DETECTED Final    Comment: Performed at [facility], [address], [place], [place] [address]    IMAGING: VAS US  LOWER EXTREMITY VENOUS (DVT) Result Date: 09/26/2023  Lower Venous DVT Study Patient Name:  [name] [name]  Date of Exam:   09/26/2023 Medical Rec #: [id number]      Accession #:    [id number] Date of Birth: 05/24/1980      Patient Gender: F Patient Age:   21 years Exam Location:  [facility] Procedure:      VAS US  LOWER EXTREMITY VENOUS (DVT) Referring Phys: Wilbur Labuda ORTIZ --------------------------------------------------------------------------------  Indications: Swelling, Pain, Edema, and history of necrotizing fascitis.  Limitations: Body habitus and poor ultrasound/tissue interface. Comparison Study: No prior exam. Performing Technologist: Edilia Elden Appl  Examination Guidelines: A complete evaluation includes B-mode imaging, spectral Doppler, color Doppler, and power Doppler as needed of all  accessible portions of each vessel. Bilateral testing is considered an integral part of a complete examination. Limited examinations for reoccurring indications may be performed as noted. The reflux portion of the exam is  performed with the patient in reverse Trendelenburg.  +---------+---------------+---------+-----------+----------+-------------------+ RIGHT    CompressibilityPhasicitySpontaneityPropertiesThrombus Aging      +---------+---------------+---------+-----------+----------+-------------------+ CFV      Full           Yes      Yes                                      +---------+---------------+---------+-----------+----------+-------------------+ SFJ      Full           Yes      Yes                                      +---------+---------------+---------+-----------+----------+-------------------+ FV Prox  Full                                                             +---------+---------------+---------+-----------+----------+-------------------+ FV Mid   Full                                                             +---------+---------------+---------+-----------+----------+-------------------+ FV DistalFull                                                             +---------+---------------+---------+-----------+----------+-------------------+ PFV      Full                                                             +---------+---------------+---------+-----------+----------+-------------------+ POP      Full           Yes      Yes                                      +---------+---------------+---------+-----------+----------+-------------------+ PTV                                                   Not well visualized +---------+---------------+---------+-----------+----------+-------------------+ PERO     Full                                                             +---------+---------------+---------+-----------+----------+-------------------+  Right posterior tibial veins not visualized on this exam.  +---------+---------------+---------+-----------+----------+--------------+ LEFT      CompressibilityPhasicitySpontaneityPropertiesThrombus Aging +---------+---------------+---------+-----------+----------+--------------+ CFV      Full           Yes      Yes                                 +---------+---------------+---------+-----------+----------+--------------+ SFJ      Full           Yes      Yes                                 +---------+---------------+---------+-----------+----------+--------------+ FV Prox  Full                                                        +---------+---------------+---------+-----------+----------+--------------+ FV Mid   Full                                                        +---------+---------------+---------+-----------+----------+--------------+ FV DistalFull                                                        +---------+---------------+---------+-----------+----------+--------------+ PFV      Full                                                        +---------+---------------+---------+-----------+----------+--------------+ POP      Full           Yes      Yes                                 +---------+---------------+---------+-----------+----------+--------------+ PTV      Full                                                        +---------+---------------+---------+-----------+----------+--------------+ PERO     Full                                                        +---------+---------------+---------+-----------+----------+--------------+    Summary: RIGHT: - There is no evidence of deep vein thrombosis in the lower extremity. However, portions of this examination were limited- see technologist comments above.  - No cystic structure found in the popliteal fossa.  LEFT: -  There is no evidence of deep vein thrombosis in the lower extremity.  - No cystic structure found in the popliteal fossa.  *See table(s) above for measurements and observations.    Preliminary    CT  Angio Chest Pulmonary Embolism (PE) W or WO Contrast Result Date: 09/26/2023 CLINICAL DATA:  Shortness of breath. Acute bilateral lower extremity swelling, fever. EXAM: CT ANGIOGRAPHY CHEST WITH CONTRAST TECHNIQUE: Multidetector CT imaging of the chest was performed using the standard protocol during bolus administration of intravenous contrast. Multiplanar CT image reconstructions and MIPs were obtained to evaluate the vascular anatomy. RADIATION DOSE REDUCTION: This exam was performed according to the departmental dose-optimization program which includes automated exposure control, adjustment of the mA and/or kV according to patient size and/or use of iterative reconstruction technique. CONTRAST:  75mL OMNIPAQUE  IOHEXOL  350 MG/ML SOLN COMPARISON:  November 02, 2018. FINDINGS: Cardiovascular: Satisfactory opacification of the pulmonary arteries to the segmental level. No evidence of pulmonary embolism. Normal heart size. No pericardial effusion. Mediastinum/Nodes: No significant mediastinal adenopathy is noted. Mildly enlarged bilateral axillary lymph nodes are noted which most likely are inflammatory or reactive in etiology. Thyroid gland, trachea, and esophagus demonstrate no significant findings. Lungs/Pleura: No pneumothorax or pleural effusion is noted. Diffuse patchy and interstitial opacities are noted throughout both lungs concerning for edema or possibly multifocal pneumonia. Upper Abdomen: Hepatic steatosis. Musculoskeletal: No chest wall abnormality. No acute or significant osseous findings. Review of the MIP images confirms the above findings. IMPRESSION: 1. No definite evidence of pulmonary embolus. 2. Diffuse patchy and interstitial opacities are noted throughout both lungs concerning for edema or possibly multifocal pneumonia. 3. Hepatic steatosis. Electronically Signed   By: Lynwood Landy Raddle M.D.   On: 09/26/2023 14:01   US  Abdomen Limited RUQ (LIVER/GB) Result Date: 09/26/2023 EXAM: Right Upper  Quadrant Abdominal Ultrasound TECHNIQUE: Real-time ultrasonography of the right upper quadrant of the abdomen was performed. COMPARISON: None. CLINICAL HISTORY: Right flank discomfort [id number]. FINDINGS: LIVER: The liver demonstrates heterogeneous and increased echogenicity. No intrahepatic biliary ductal dilatation. No mass. BILIARY SYSTEM: No evidence of pericholecystic fluid or wall thickening. No cholelithiasis. Positive sonographic Murphy's sign. Common bile duct mildly dilated measuring 7mm. OTHER: No right upper quadrant ascites. IMPRESSION: 1. Findings are equivocal for acute cholecystitis. A positive sonographic murphy sign was noted by the sonographer and there is mild dilation of the common bile duct. However, there is no cholelithiasis, gallbladder wall thickening, or pericholecystic fluid. Consider HIDA scan for further evaluation. Electronically signed by: Norman Gatlin MD 09/26/2023 02:53 AM EDT RP Workstation: [id number]   CT ABDOMEN PELVIS W CONTRAST Result Date: 09/26/2023 EXAM: CT ABDOMEN AND PELVIS WITH CONTRAST 09/26/2023 12:58:30 AM TECHNIQUE: CT of the abdomen and pelvis was performed with the administration of intravenous contrast, specifically 100mL of iohexol  (OMNIPAQUE ) 300 MG/ML solution. Multiplanar reformatted images are provided for review. Automated exposure control, iterative reconstruction, and/or weight-based adjustment of the mA/kV was utilized to reduce the radiation dose to as low as reasonably achievable. COMPARISON: Apr 10, 2018 CLINICAL HISTORY: Abdominal pain, acute, nonlocalized. FINDINGS: LOWER CHEST: No acute abnormality. LIVER: Hepatic steatosis. GALLBLADDER AND BILE DUCTS: Gallbladder is unremarkable. No biliary ductal dilatation. SPLEEN: The spleen measures 17.0 cm in craniocaudal dimension and contains a hypoattenuating lesion in the posterior spleen, which is likely a benign cyst or hemangioma. Splenomegaly is present. PANCREAS: No acute abnormality. ADRENAL GLANDS:  No acute abnormality. KIDNEYS, URETERS AND BLADDER: No stones in the kidneys or ureters. No hydronephrosis. No perinephric or periureteral stranding. Urinary  bladder is unremarkable. GI AND BOWEL: Stomach demonstrates no acute abnormality. There is no bowel obstruction. PERITONEUM AND RETROPERITONEUM: No ascites.  No free air. VASCULATURE: Aorta is normal in caliber. LYMPH NODES: No lymphadenopathy. REPRODUCTIVE ORGANS: No acute abnormality. BONES AND SOFT TISSUES: No acute osseous abnormality. Edema in the subcutaneous fat of the bilateral flanks with subcutaneous stranding and edema with skin thickening in the subcutaneous fat of the pannus. Additional right greater than left edema and stranding in the subcutaneous fat of the thighs. No focal soft tissue abnormality. IMPRESSION: 1. Nonspecific edema in the subcutaneous fat of the bilateral flanks, pannus, and thighs (right greater than left). Correlate for cellulitis. 2. Hepatic steatosis. 3. Splenomegaly with a hypoattenuating lesion in the posterior spleen, likely a benign cyst or hemangioma. Electronically signed by: Norman Gatlin MD 09/26/2023 01:13 AM EDT RP Workstation: [id number]   CT EXTREMITY LOWER LEFT W CONTRAST Result Date: 09/26/2023 CLINICAL DATA:  Osteomyelitis suspected, skin graft to left lower extremity. Swelling, fever. EXAM: CT OF THE LOWER LEFT EXTREMITY WITH CONTRAST TECHNIQUE: Multidetector CT imaging of the lower left extremity was performed according to the standard protocol following intravenous contrast administration. RADIATION DOSE REDUCTION: This exam was performed according to the departmental dose-optimization program which includes automated exposure control, adjustment of the mA and/or kV according to patient size and/or use of iterative reconstruction technique. CONTRAST: OMNIPAQUE  IOHEXOL  300 MG/ML  SOLN COMPARISON:  None Available. FINDINGS: Bones/Joint/Cartilage No acute bony abnormality. Specifically, no fracture,  subluxation, or dislocation. No bone destruction to suggest osteomyelitis. Small joint effusion within the left knee. Ligaments Suboptimally assessed by CT. Muscles and Tendons Unremarkable Soft tissues Skin staples noted anteriorly within the left shin region presumably related to reported skin graft. Edema within the subcutaneous soft tissues could be postoperative or related to cellulitis. No focal fluid collection. IMPRESSION: No acute bony abnormality. No bone destruction to suggest osteomyelitis. Edema throughout the subcutaneous soft tissues in the left lower extremity from the distal thigh through the calf could be postoperative or related to cellulitis. Electronically Signed   By: Franky Crease M.D.   On: 09/26/2023 01:09   DG Tibia/Fibula Left Result Date: 09/25/2023 CLINICAL DATA:  Skin graft left lower extremity, swelling, fever EXAM: LEFT TIBIA AND FIBULA - 2 VIEW COMPARISON:  None Available. FINDINGS: Frontal and lateral views of the left lower leg are obtained. Multiple surgical staples are seen within the anterior proximal left lower leg consistent with history of skin graft surgery. There is diffuse subcutaneous edema. No subcutaneous gas. No acute or destructive bony abnormalities. IMPRESSION: 1. Postsurgical changes from skin graft procedure. 2. Extensive subcutaneous edema throughout the visualized left lower leg, which could be postoperative or related to cellulitis. 3. No acute or destructive bony abnormality. Electronically Signed   By: Ozell Daring M.D.   On: 09/25/2023 22:52   DG Chest Port 1 View Result Date: 09/25/2023 CLINICAL DATA:  Short of breath, fever EXAM: PORTABLE CHEST 1 VIEW COMPARISON:  10/21/2019 FINDINGS: The heart size and mediastinal contours are within normal limits. Both lungs are clear. The visualized skeletal structures are unremarkable. IMPRESSION: No active disease. Electronically Signed   By: Ozell Daring M.D.   On: 09/25/2023 22:51    Assessment:   Shaliah Wann is a 43 y.o. female with extensive history as above most notable for history of IV drug use including fentanyl  now with fevers, right lower extremity cellulitis, right upper quadrant abdominal pain elevated LFTs and markedly elevated alk phos.  HIV  initial test is positive.  Actively in withdrawal from opioids.  History is difficult to obtain.  Recommendations New diagnosis HIV- she has not been informed of ths yet by hospitalist and since she is actively withrdrawing I did not discuss - ordered PCR with reflex to genotype, CD4, RPR, gonorrhea chlamydia and hepatitis panel has already been ordered.  Elevated LFTs right upper quadrant abdominal pain.  Ultrasound and CT scan showed hepatic steatosis and splenomegaly that gallbladder and biliary tract seem intact.  Getting HIDA scan.  May need ERCP or MRCP.  Hepatitis panel is pending.  Will check EBV and CMV PCR's  Staph epi bacteremia-could be real from her cellulitis and injecting but could also be contaminant.  Repeat blood cultures pending.  Echocardiogram ordered as she is high risk for endocarditis and reports swelling and edema over the last few months.  Fever-multiple possible causes as above.  Would continue ceftriaxone  vancomycin  and Flagyl .  Work up elevated LFTs for biliary source.  Opioid addiction-she is actively withdrawing.  Will treat her withdrawal symptoms.  She will need to be connected with Suboxone  or methadone clinic at discharge.  Thank you very much for allowing me to participate in the care of this patient. Please call with questions.   [name] SQUIBB. Epifanio, MD

## 2023-09-27 NOTE — Progress Notes (Addendum)
     Patient Name: [name] [name]           DOB: 11/23/1980  MRN: [id number]      Admission Date: 09/25/2023  Attending Provider: Celinda Alm Lot, MD  Primary Diagnosis: Elevated LFTs   Level of care: Progressive   OVERNIGHT EVENT  Patient complaining of shortness of breath with increased WOB.   All other vital stable, O2 increased to 4 L [place] given brief and mild hypoxia (88%) Patient reports some relief after neb treatment. CTA chest impression- no definite evidence of PE.  Diffuse patchy and interstitial opacities are noted throughout both lungs concerning for edema or possibly multifocal pneumonia.  Hepatic steatosis. RSV, flu, COVID-negative  Differential Dx-pulmonary edema, CAP,  Plan: Continue IV ceftriaxone  and Flagyl . Check procalcitonin, proBNP.   Continue neb treatment as needed.   Encourage frequent use of incentive spirometry Continue titrating O2 down when possible Neb treatment as needed   Sherel Fennell, DNP, ACNPC- [facility]

## 2023-09-27 NOTE — Progress Notes (Signed)
 PHARMACY - PHYSICIAN COMMUNICATION CRITICAL VALUE ALERT - BLOOD CULTURE IDENTIFICATION (BCID)  [name] [name] is an 43 y.o. female who presented to [facility] on 09/25/2023 with a chief complaint of fever and leg swelling  Assessment:  1/4 Staph epi, no R  Name of physician (or Provider) Contacted: Andrez  Current antibiotics: CTX, Vanc  Changes to prescribed antibiotics recommended:  None, probable contaminant  Results for orders placed or performed during the hospital encounter of 09/25/23  Blood Culture ID Panel (Reflexed) (Collected: 09/25/2023  9:54 PM)  Result Value Ref Range   Enterococcus faecalis NOT DETECTED NOT DETECTED   Enterococcus Faecium NOT DETECTED NOT DETECTED   Listeria monocytogenes NOT DETECTED NOT DETECTED   Staphylococcus species DETECTED (A) NOT DETECTED   Staphylococcus aureus (BCID) NOT DETECTED NOT DETECTED   Staphylococcus epidermidis DETECTED (A) NOT DETECTED   Staphylococcus lugdunensis NOT DETECTED NOT DETECTED   Streptococcus species NOT DETECTED NOT DETECTED   Streptococcus agalactiae NOT DETECTED NOT DETECTED   Streptococcus pneumoniae NOT DETECTED NOT DETECTED   Streptococcus pyogenes NOT DETECTED NOT DETECTED   A.calcoaceticus-baumannii NOT DETECTED NOT DETECTED   Bacteroides fragilis NOT DETECTED NOT DETECTED   Enterobacterales NOT DETECTED NOT DETECTED   Enterobacter cloacae complex NOT DETECTED NOT DETECTED   Escherichia coli NOT DETECTED NOT DETECTED   Klebsiella aerogenes NOT DETECTED NOT DETECTED   Klebsiella oxytoca NOT DETECTED NOT DETECTED   Klebsiella pneumoniae NOT DETECTED NOT DETECTED   Proteus species NOT DETECTED NOT DETECTED   Salmonella species NOT DETECTED NOT DETECTED   Serratia marcescens NOT DETECTED NOT DETECTED   Haemophilus influenzae NOT DETECTED NOT DETECTED   Neisseria meningitidis NOT DETECTED NOT DETECTED   Pseudomonas aeruginosa NOT DETECTED NOT DETECTED   Stenotrophomonas maltophilia NOT DETECTED NOT DETECTED    Candida albicans NOT DETECTED NOT DETECTED   Candida auris NOT DETECTED NOT DETECTED   Candida glabrata NOT DETECTED NOT DETECTED   Candida krusei NOT DETECTED NOT DETECTED   Candida parapsilosis NOT DETECTED NOT DETECTED   Candida tropicalis NOT DETECTED NOT DETECTED   Cryptococcus neoformans/gattii NOT DETECTED NOT DETECTED   Methicillin resistance mecA/C NOT DETECTED NOT DETECTED   Leeroy Mace RPh 09/27/2023, 1:12 AM

## 2023-09-27 NOTE — Progress Notes (Signed)
 Pharmacy Antibiotic Note  [name] [name] is a 43 y.o. female admitted on 09/25/2023 with cellulitis.  Pharmacy has been consulted for vancomycin  dosing.  Plan: Due to updated weight in chart, will change vanc from 1250mg  IV q24 to 1g IV q12 - goal AUC 400-550 Continue Rocephin /Flagyl  per ID rec's  Height: 5' 5 (165.1 cm) Weight: 109.1 kg (240 lb 8.4 oz) IBW/kg (Calculated) : 57  Temp (24hrs), Avg:98.8 F (37.1 C), Min:97.8 F (36.6 C), Max:101.5 F (38.6 C)  Recent Labs  Lab 09/25/23 2228 09/25/23 2251 09/26/23 0000 09/26/23 0921 09/26/23 1545 09/27/23 0711  WBC  --   --  4.9  --  5.3 6.1  CREATININE  --  0.83  --  0.84  --  0.79  LATICACIDVEN 1.9  --   --   --   --   --     Estimated Creatinine Clearance: 109.1 mL/min (by C-G formula based on SCr of 0.79 mg/dL).    Allergies  Allergen Reactions   Epidural Tray 17gx3-1-2 [Nerve Block Tray] Other (See Comments)    Paralysis and severe pain in head/neck/shoulders.  No anaphylaxis.   Ibuprofen  Hives   Penicillins Hives     Tolerated Cephalosporin 10/22/2019.   Pt has tolerated cephalosporins on past admissions. Has patient had a PCN reaction causing immediate rash, facial/tongue/throat swelling, SOB or lightheadedness with hypotension: Yes Has patient had a PCN reaction causing severe rash involving mucus membranes or skin necrosis: No Has patient had a PCN reaction that required hospitalization No Has patient had a PCN reaction occurring within the last 10 years: No If all of the above answers are NO, then may   Zofran  [Ondansetron  Hcl] Nausea And Vomiting     Thank you for allowing pharmacy to be a part of this patient's care.  Britta Eva Na 09/27/2023 11:00 AM

## 2023-09-27 NOTE — Progress Notes (Signed)
 Patient found dyspneic by RN, O2 sat 94-95% on oxygen at 4lpm, RR 32 bpm. Placed patient on high back rest, called respiratory for breathing treatment, pain med given called rapid response, CN Mickel and Abigail NP made aware, seen patient and made orders. Will continue to monitor.

## 2023-09-28 ENCOUNTER — Inpatient Hospital Stay (HOSPITAL_COMMUNITY)

## 2023-09-28 DIAGNOSIS — R7989 Other specified abnormal findings of blood chemistry: Secondary | ICD-10-CM | POA: Diagnosis not present

## 2023-09-28 DIAGNOSIS — I38 Endocarditis, valve unspecified: Secondary | ICD-10-CM | POA: Diagnosis not present

## 2023-09-28 DIAGNOSIS — L03115 Cellulitis of right lower limb: Secondary | ICD-10-CM | POA: Diagnosis not present

## 2023-09-28 LAB — CBC
HCT: 28.8 % — ABNORMAL LOW (ref 36.0–46.0)
Hemoglobin: 8.9 g/dL — ABNORMAL LOW (ref 12.0–15.0)
MCH: 25.1 pg — ABNORMAL LOW (ref 26.0–34.0)
MCHC: 30.9 g/dL (ref 30.0–36.0)
MCV: 81.4 fL (ref 80.0–100.0)
Platelets: 176 K/uL (ref 150–400)
RBC: 3.54 MIL/uL — ABNORMAL LOW (ref 3.87–5.11)
RDW: 20.1 % — ABNORMAL HIGH (ref 11.5–15.5)
WBC: 6.4 K/uL (ref 4.0–10.5)
nRBC: 0.3 % — ABNORMAL HIGH (ref 0.0–0.2)

## 2023-09-28 LAB — HEPATIC FUNCTION PANEL
ALT: 52 U/L — ABNORMAL HIGH (ref 0–44)
AST: 166 U/L — ABNORMAL HIGH (ref 15–41)
Albumin: 1.9 g/dL — ABNORMAL LOW (ref 3.5–5.0)
Alkaline Phosphatase: 1118 U/L — ABNORMAL HIGH (ref 38–126)
Bilirubin, Direct: 2.7 mg/dL — ABNORMAL HIGH (ref 0.0–0.2)
Indirect Bilirubin: 1.2 mg/dL — ABNORMAL HIGH (ref 0.3–0.9)
Total Bilirubin: 3.9 mg/dL — ABNORMAL HIGH (ref 0.0–1.2)
Total Protein: 6.4 g/dL — ABNORMAL LOW (ref 6.5–8.1)

## 2023-09-28 LAB — HIV-1 RNA, PCR (GRAPH) RFX/GENO EDI
HIV-1 RNA BY PCR: <20 {copies}/mL
HIV-1 RNA Quant, Log: UNDETERMINED {Log_copies}/mL

## 2023-09-28 LAB — URINE CULTURE: Culture: >=100000 — AB

## 2023-09-28 LAB — ECHOCARDIOGRAM COMPLETE
Height: 65 in
S' Lateral: 2.6 cm
Weight: 3848.35 [oz_av]

## 2023-09-28 LAB — GC/CHLAMYDIA PROBE AMP (~~LOC~~) NOT AT ARMC
Chlamydia: NEGATIVE
Comment: NEGATIVE
Comment: NORMAL
Neisseria Gonorrhea: NEGATIVE

## 2023-09-28 LAB — CULTURE, BLOOD (ROUTINE X 2): Special Requests: ADEQUATE

## 2023-09-28 LAB — BASIC METABOLIC PANEL WITH GFR
Anion gap: 10 (ref 5–15)
BUN: 15 mg/dL (ref 6–20)
CO2: 19 mmol/L — ABNORMAL LOW (ref 22–32)
Calcium: 7.7 mg/dL — ABNORMAL LOW (ref 8.9–10.3)
Chloride: 105 mmol/L (ref 98–111)
Creatinine, Ser: 0.66 mg/dL (ref 0.44–1.00)
GFR, Estimated: >60 mL/min (ref >60)
Glucose, Bld: 103 mg/dL — ABNORMAL HIGH (ref 70–99)
Potassium: 4.2 mmol/L (ref 3.5–5.1)
Sodium: 134 mmol/L — ABNORMAL LOW (ref 135–145)

## 2023-09-28 LAB — CD4/CD8 (T-HELPER/T-SUPPRESSOR CELL)
CD4 absolute: 489 /uL (ref 400–1790)
CD4%: 54.87 % (ref 33–65)
CD8 T Cell Abs: 196 /uL (ref 190–1000)
CD8tox: 22.05 % (ref 12–40)
Ratio: 2.49 (ref 1.0–3.0)
Total lymphocyte count: 891 /uL — ABNORMAL LOW (ref 1000–4000)

## 2023-09-28 LAB — RPR: RPR Ser Ql: NONREACTIVE

## 2023-09-28 LAB — MRSA NEXT GEN BY PCR, NASAL: MRSA by PCR Next Gen: NOT DETECTED

## 2023-09-28 MED ORDER — FUROSEMIDE 10 MG/ML IJ SOLN
40.0000 mg | Freq: Two times a day (BID) | INTRAMUSCULAR | Status: DC
  Administered 2023-09-28 – 2023-10-03 (×9): 40 mg via INTRAVENOUS
  Filled 2023-09-28 (×12): qty 4

## 2023-09-28 MED ORDER — ORAL CARE MOUTH RINSE: 15.0000 mL | OROMUCOSAL | Status: DC | PRN

## 2023-09-28 MED ORDER — FUROSEMIDE 10 MG/ML IJ SOLN
40.0000 mg | Freq: Once | INTRAMUSCULAR | Status: AC
  Administered 2023-09-28: 40 mg via INTRAVENOUS
  Filled 2023-09-28: qty 4

## 2023-09-28 MED ORDER — HYDROMORPHONE HCL 1 MG/ML IJ SOLN
1.0000 mg | INTRAMUSCULAR | Status: DC | PRN
  Administered 2023-09-28 – 2023-10-02 (×29): 1 mg via INTRAVENOUS
  Filled 2023-09-28 (×29): qty 1

## 2023-09-28 MED ORDER — DIAZEPAM 5 MG/ML IJ SOLN
2.5000 mg | Freq: Once | INTRAMUSCULAR | Status: AC
  Administered 2023-09-28: 2.5 mg via INTRAVENOUS
  Filled 2023-09-28: qty 2

## 2023-09-28 MED ORDER — CHLORHEXIDINE GLUCONATE CLOTH 2 % EX PADS
6.0000 | MEDICATED_PAD | Freq: Every day | CUTANEOUS | Status: DC
  Administered 2023-09-28: 6 via TOPICAL

## 2023-09-28 NOTE — Progress Notes (Signed)
 2322- This RN notified on call provider of the patients c/o SOB and bilateral lower and upper extremity swelling. Patient verbalized feeling as though she is receiving excessive IV fluid and requested a decrease. Lungs auscultated clear. 2-3+ edema and extremities noted, consent with admissions findings. O2 saturations 86% on 4L. Respiratory therapist notified for breathing treatment.  NP instructed RN to stop fluids.   0003- After breathing treatment O2 saturations improved to 92 percent for about 15 minutes and then returned to as before. Patient also reported nasal congestion, possibly contributing to SOB. HR remain in 120s. Patient requested alternative medications for anxiety as atarax  25mg  was given and not effective. NP instructed to proceed with next dose of dilaudid  and placed order for valium . Medication admintered per provider order.  28- Patient's O2 saturation continues to remain less that 90% not rising above 88% despite 4L O2 with humidification, and breathing treatments, O2 increased to 7L to maintain at atlest 92% provider notified of increase in oxygen need 3-4L since 11pm, Provider verbalized understanding.

## 2023-09-28 NOTE — Progress Notes (Signed)
  Echocardiogram 2D Echocardiogram has been performed.  Koleen KANDICE Popper, RDCS 09/28/2023, 2:09 PM

## 2023-09-28 NOTE — Progress Notes (Signed)
 MD Adhikari & Charge RN Rexene made aware. Patient to transfer to [facility] unit for closer monitoring.   09/28/23 0814  Vitals  Temp 98.1 F (36.7 C)  Temp Source Oral  BP 128/82  MAP (mmHg) 96  BP Location Left Arm  ECG Heart Rate (!) 118  Resp (!) 28  Level of Consciousness  Level of Consciousness Alert  MEWS COLOR  MEWS Score Color Red  Oxygen Therapy  SpO2 100 %  O2 Device HFNC  O2 Flow Rate (L/min) 9 L/min  MEWS Score  MEWS Temp 0  MEWS Systolic 0  MEWS Pulse 2  MEWS RR 2  MEWS LOC 0  MEWS Score 4

## 2023-09-28 NOTE — Plan of Care (Signed)

## 2023-09-28 NOTE — Progress Notes (Signed)
 [facility]  Date of Admission:  09/25/2023      Abx: 9/6-c vanc 9/6-c ceftriaxone  9/6-c metronidazole    ASSESSMENT: 43 yo female anxiety, lumbago, substance abuse, hx epidural spinal abscess, admitted with anasarca and also rle cellulitis meeting sepsis criteria (febrile to 101.7), along with having extremely elevated alkphos out of proption to ast/alt, and a reactive hiv test pending confirmation  Initial bcx 1 set staph epi unclear significance  Uds with amphetamines/fentanyl   RLE Cellulitis improving on abx  Hiv testing hasn't discussed with patient --waiting on confirmation. High risk with substance abuse. Is together with a long term partner but not sexually active. Previous hiv test (most recent one 5 years prior, per her report, was negative)  9/6 ct rle No acute bony abnormality. No bone destruction to suggest osteomyelitis. Edema throughout the subcutaneous soft tissues in the left lower extremity from the distal thigh through the calf could be postoperative or related to cellulitis.  9/5 cxr clear  9/6 liver u/s 1. Findings are equivocal for acute cholecystitis. A positive sonographic murphy sign was noted by the sonographer and there is mild dilation of the common bile duct. However, there is no cholelithiasis, gallbladder wall thickening, or pericholecystic fluid. Consider HIDA scan for further evaluation.  9/7 hida scan negative  9/8 echo unremarkable  Lft elevation but no sx from cholecystitis/cholangitis standpoint, will benefit from further GI input. ID infectious process that caused this usually is spirochetes such as leptospirosis, syphilis, lyme. Reasonable to check for syphilis, but suspect liver cirrhosis (which could explain the anasarca). Or her anasarca/right sided volume overload could also be explained by pulm htn. She does have smoking hx as well   -------- At this point for id purpose Focus on  cellulitis No suspicion for intraabd infection -- will r/o syphillis and other hepatitis. Hcv ab positive; hbv ab and hav ab negative Await hiv confirmation    PLAN: Hcv rna testing/reflex genotype Rpr Hiv rna Continue abx for cellulitis and will transition to cefadroxil  plan 2 more weeks for what appears to be nonpurulent cellulitis Maintain standard isolation precaution Would consider GI input for her lft elevation and pulm input for significant elevated o2 requirement despite normal xray chest Consider chest cta r/o pe in setting hypoxemia I do not suspect CoNS to be of any significance and will  not pursue further w/u for this Discussed with primary team  Principal Problem:   Elevated LFTs Active Problems:   MDD (major depressive disorder), recurrent severe, without psychosis (HCC)   RUQ pain   Substance abuse (HCC)   Cellulitis of both lower extremities   Normocytic anemia   Protein-calorie malnutrition, severe (HCC)   Hyponatremia   Thrombocytopenia (HCC)   Allergies  Allergen Reactions   Epidural Tray [id number] [Nerve Block Tray] Other (See Comments)    Paralysis and severe pain in head/neck/shoulders.  No anaphylaxis.   Ibuprofen  Hives   Penicillins Hives     Tolerated Cephalosporin 10/22/2019.   Pt has tolerated cephalosporins on past admissions. Has patient had a PCN reaction causing immediate rash, facial/tongue/throat swelling, SOB or lightheadedness with hypotension: Yes Has patient had a PCN reaction causing severe rash involving mucus membranes or skin necrosis: No Has patient had a PCN reaction that required hospitalization No Has patient had a PCN reaction occurring within the last 10 years: No If all of the above answers are NO, then may   Zofran  [Ondansetron  Hcl] Nausea And Vomiting  Scheduled Meds:  Chlorhexidine  Gluconate Cloth  6 each Topical Daily   cloNIDine   0.1 mg Oral QID   Followed by   NOREEN ON 09/29/2023] cloNIDine   0.1 mg Oral  BH-qamhs   Followed by   NOREEN ON 10/01/2023] cloNIDine   0.1 mg Oral QAC breakfast   furosemide   40 mg Intravenous Q12H   influenza vac split trivalent PF  0.5 mL Intramuscular Tomorrow-1000   Continuous Infusions:  cefTRIAXone  (ROCEPHIN )  IV Stopped (09/28/23 1247)   metronidazole  Stopped (09/28/23 1401)   PRN Meds:.acetaminophen , albuterol , dicyclomine , HYDROmorphone  (DILAUDID ) injection, hydrOXYzine , ketorolac , loperamide , methocarbamol , nicotine , ondansetron , mouth rinse, oxyCODONE , prochlorperazine , sodium chloride  flush   SUBJECTIVE: Legs pain better Getting lasix  and feel less swollen in legs No cough On 10 liters o2    Review of Systems: ROS All other ROS was negative, except mentioned above     OBJECTIVE: Vitals:   09/28/23 1100 09/28/23 1200 09/28/23 1218 09/28/23 1300  BP: (!) 129/91 (!) 130/94  125/88  Pulse: (!) 113 (!) 112  (!) 115  Resp: (!) 27 (!) 35  (!) 29  Temp:   98.5 F (36.9 C)   TempSrc:   Oral   SpO2: 98% 96%  97%  Weight:      Height:       Body mass index is 40.02 kg/m.  Physical Exam General/constitutional: no distress, pleasant; 10liters o2 HEENT: Normocephalic, PER, Conj Clear, EOMI, Oropharynx clear Neck supple CV: rrr no mrg Lungs: clear to auscultation, normal respiratory effort Abd: Soft, Nontender Ext: 1-2+ bilateral lower edema Skin: wrinkled warm red skin rle vs left Neuro: nonfocal MSK: no peripheral joint swelling/tenderness/warmth; back spines nontender  Lab Results Lab Results  Component Value Date   WBC 6.4 09/28/2023   HGB 8.9 (L) 09/28/2023   HCT 28.8 (L) 09/28/2023   MCV 81.4 09/28/2023   PLT 176 09/28/2023    Lab Results  Component Value Date   CREATININE 0.66 09/28/2023   BUN 15 09/28/2023   NA 134 (L) 09/28/2023   K 4.2 09/28/2023   CL 105 09/28/2023   CO2 19 (L) 09/28/2023    Lab Results  Component Value Date   ALT 52 (H) 09/28/2023   AST 166 (H) 09/28/2023   ALKPHOS 1,118 (H) 09/28/2023    BILITOT 3.9 (H) 09/28/2023      Microbiology: Recent Results (from the past 240 hours)  Resp panel by RT-PCR (RSV, Flu A&B, Covid) Anterior Nasal Swab     Status: None   Collection Time: 09/25/23  9:54 PM   Specimen: Anterior Nasal Swab  Result Value Ref Range Status   SARS Coronavirus 2 by RT PCR NEGATIVE NEGATIVE Final    Comment: (NOTE) SARS-CoV-2 target nucleic acids are NOT DETECTED.  The SARS-CoV-2 RNA is generally detectable in upper respiratory specimens during the acute phase of infection. The lowest concentration of SARS-CoV-2 viral copies this assay can detect is 138 copies/mL. A negative result does not preclude SARS-Cov-2 infection and should not be used as the sole basis for treatment or other patient management decisions. A negative result may occur with  improper specimen collection/handling, submission of specimen other than nasopharyngeal swab, presence of viral mutation(s) within the areas targeted by this assay, and inadequate number of viral copies(<138 copies/mL). A negative result must be combined with clinical observations, patient history, and epidemiological information. The expected result is Negative.  Fact Sheet for Patients:  BloggerCourse.com  Fact Sheet for Healthcare Providers:  SeriousBroker.it  This test is no t  yet approved or cleared by the [place]  FDA and  has been authorized for detection and/or diagnosis of SARS-CoV-2 by FDA under an Emergency Use Authorization (EUA). This EUA will remain  in effect (meaning this test can be used) for the duration of the COVID-19 declaration under Section 564(b)(1) of the Act, 21 U.S.C.section 360bbb-3(b)(1), unless the authorization is terminated  or revoked sooner.       Influenza A by PCR NEGATIVE NEGATIVE Final   Influenza B by PCR NEGATIVE NEGATIVE Final    Comment: (NOTE) The Xpert Xpress SARS-CoV-2/FLU/RSV plus assay is intended as an  aid in the diagnosis of influenza from Nasopharyngeal swab specimens and should not be used as a sole basis for treatment. Nasal washings and aspirates are unacceptable for Xpert Xpress SARS-CoV-2/FLU/RSV testing.  Fact Sheet for Patients: BloggerCourse.com  Fact Sheet for Healthcare Providers: SeriousBroker.it  This test is not yet approved or cleared by the [place]  FDA and has been authorized for detection and/or diagnosis of SARS-CoV-2 by FDA under an Emergency Use Authorization (EUA). This EUA will remain in effect (meaning this test can be used) for the duration of the COVID-19 declaration under Section 564(b)(1) of the Act, 21 U.S.C. section 360bbb-3(b)(1), unless the authorization is terminated or revoked.     Resp Syncytial Virus by PCR NEGATIVE NEGATIVE Final    Comment: (NOTE) Fact Sheet for Patients: BloggerCourse.com  Fact Sheet for Healthcare Providers: SeriousBroker.it  This test is not yet approved or cleared by the [place]  FDA and has been authorized for detection and/or diagnosis of SARS-CoV-2 by FDA under an Emergency Use Authorization (EUA). This EUA will remain in effect (meaning this test can be used) for the duration of the COVID-19 declaration under Section 564(b)(1) of the Act, 21 U.S.C. section 360bbb-3(b)(1), unless the authorization is terminated or revoked.  Performed at [facility], [address] [place], [place] [address]   Culture, blood (Routine X 2) w Reflex to ID Panel     Status: None (Preliminary result)   Collection Time: 09/25/23  9:54 PM   Specimen: BLOOD  Result Value Ref Range Status   Specimen Description   Final    BLOOD BLOOD RIGHT ARM Performed at [facility], [address], [place], [place] [address]    Special Requests   Final    BOTTLES DRAWN AEROBIC AND ANAEROBIC Blood  Culture results may not be optimal due to an inadequate volume of blood received in culture bottles Performed at [facility], [address], [place], [place] [address]    Culture   Final    NO GROWTH 2 DAYS Performed at [facility], [address], [place], [place] [address]    Report Status PENDING  Incomplete  Culture, blood (Routine X 2) w Reflex to ID Panel     Status: Abnormal   Collection Time: 09/25/23  9:54 PM   Specimen: BLOOD LEFT ARM  Result Value Ref Range Status   Specimen Description   Final    BLOOD LEFT ARM Performed at [facility], [address], [place], [place] [address]    Special Requests   Final    BOTTLES DRAWN AEROBIC AND ANAEROBIC Blood Culture adequate volume Performed at [facility], [address] [place], [place] [address]    Culture  Setup Time   Final    GRAM POSITIVE COCCI IN CLUSTERS IN BOTH AEROBIC AND ANAEROBIC BOTTLES CRITICAL RESULT CALLED TO,  READ BACK BY AND VERIFIED WITH: PHARMD E JACKSON 09/26/2023 @ 2338 BY AB    Culture (A)  Final    STAPHYLOCOCCUS EPIDERMIDIS THE SIGNIFICANCE OF ISOLATING THIS ORGANISM FROM A SINGLE SET OF BLOOD CULTURES WHEN MULTIPLE SETS ARE DRAWN IS UNCERTAIN. PLEASE NOTIFY THE MICROBIOLOGY DEPARTMENT WITHIN ONE WEEK IF SPECIATION AND SENSITIVITIES ARE REQUIRED. Performed at [facility], [address] [place], [place] [address]    Report Status 09/28/2023 FINAL  Final  Urine Culture     Status: Abnormal   Collection Time: 09/25/23  9:54 PM   Specimen: Urine, Random  Result Value Ref Range Status   Specimen Description   Final    URINE, RANDOM Performed at [facility], [address], [place], [place] [address]    Special Requests   Final    NONE Reflexed from [id number] Performed at [facility], [address] [place], [place] [address]    Culture >=100,000 COLONIES/mL SERRATIA MARCESCENS (A)  Final   Report  Status 09/28/2023 FINAL  Final   Organism ID, Bacteria SERRATIA MARCESCENS (A)  Final      Susceptibility   Serratia marcescens - MIC*    CEFEPIME  <=0.12 SENSITIVE Sensitive     ERTAPENEM <=0.12 SENSITIVE Sensitive     CEFTRIAXONE  <=0.25 SENSITIVE Sensitive     CIPROFLOXACIN  <=0.06 SENSITIVE Sensitive     GENTAMICIN <=1 SENSITIVE Sensitive     NITROFURANTOIN  128 RESISTANT Resistant     TRIMETH/SULFA <=20 SENSITIVE Sensitive     MEROPENEM <=0.25 SENSITIVE Sensitive     * >=100,000 COLONIES/mL SERRATIA MARCESCENS  Blood Culture ID Panel (Reflexed)     Status: Abnormal   Collection Time: 09/25/23  9:54 PM  Result Value Ref Range Status   Enterococcus faecalis NOT DETECTED NOT DETECTED Final   Enterococcus Faecium NOT DETECTED NOT DETECTED Final   Listeria monocytogenes NOT DETECTED NOT DETECTED Final   Staphylococcus species DETECTED (A) NOT DETECTED Final    Comment: CRITICAL RESULT CALLED TO, READ BACK BY AND VERIFIED WITH: PHARMD E JACKSON 09/26/2023 @ 2338 BY AB    Staphylococcus aureus (BCID) NOT DETECTED NOT DETECTED Final   Staphylococcus epidermidis DETECTED (A) NOT DETECTED Final    Comment: CRITICAL RESULT CALLED TO, READ BACK BY AND VERIFIED WITH: PHARMD E JACKSON 09/26/2023 @ 2338 BY AB    Staphylococcus lugdunensis NOT DETECTED NOT DETECTED Final   Streptococcus species NOT DETECTED NOT DETECTED Final   Streptococcus agalactiae NOT DETECTED NOT DETECTED Final   Streptococcus pneumoniae NOT DETECTED NOT DETECTED Final   Streptococcus pyogenes NOT DETECTED NOT DETECTED Final   A.calcoaceticus-baumannii NOT DETECTED NOT DETECTED Final   Bacteroides fragilis NOT DETECTED NOT DETECTED Final   Enterobacterales NOT DETECTED NOT DETECTED Final   Enterobacter cloacae complex NOT DETECTED NOT DETECTED Final   Escherichia coli NOT DETECTED NOT DETECTED Final   Klebsiella aerogenes NOT DETECTED NOT DETECTED Final   Klebsiella oxytoca NOT DETECTED NOT DETECTED Final   Klebsiella  pneumoniae NOT DETECTED NOT DETECTED Final   Proteus species NOT DETECTED NOT DETECTED Final   Salmonella species NOT DETECTED NOT DETECTED Final   Serratia marcescens NOT DETECTED NOT DETECTED Final   Haemophilus influenzae NOT DETECTED NOT DETECTED Final   Neisseria meningitidis NOT DETECTED NOT DETECTED Final   Pseudomonas aeruginosa NOT DETECTED NOT DETECTED Final   Stenotrophomonas maltophilia NOT DETECTED NOT DETECTED Final   Candida albicans NOT DETECTED NOT DETECTED Final   Candida auris NOT DETECTED NOT  DETECTED Final   Candida glabrata NOT DETECTED NOT DETECTED Final   Candida krusei NOT DETECTED NOT DETECTED Final   Candida parapsilosis NOT DETECTED NOT DETECTED Final   Candida tropicalis NOT DETECTED NOT DETECTED Final   Cryptococcus neoformans/gattii NOT DETECTED NOT DETECTED Final   Methicillin resistance mecA/C NOT DETECTED NOT DETECTED Final    Comment: Performed at [facility], [address], [place], [place] [address]  Culture, blood (Routine X 2) w Reflex to ID Panel     Status: None (Preliminary result)   Collection Time: 09/27/23  9:56 AM   Specimen: BLOOD RIGHT ARM  Result Value Ref Range Status   Specimen Description   Final    BLOOD RIGHT ARM Performed at [facility], [address], [place], [place] [address]    Special Requests   Final    BOTTLES DRAWN AEROBIC AND ANAEROBIC Blood Culture results may not be optimal due to an inadequate volume of blood received in culture bottles Performed at [facility], [address] [place], [place] [address]    Culture   Final    NO GROWTH < 24 HOURS Performed at [facility], [address], [place], [place] [address]    Report Status PENDING  Incomplete  Culture, blood (Routine X 2) w Reflex to ID Panel     Status: None (Preliminary result)   Collection Time: 09/27/23  9:56 AM   Specimen: BLOOD RIGHT HAND  Result Value Ref Range Status   Specimen Description   Final     BLOOD RIGHT HAND Performed at [facility], [address], [place], [place] [address]    Special Requests   Final    BOTTLES DRAWN AEROBIC AND ANAEROBIC Blood Culture results may not be optimal due to an inadequate volume of blood received in culture bottles Performed at [facility], [address], [place], [place] [address]    Culture   Final    NO GROWTH < 24 HOURS Performed at [facility], [address], [place], [place] [address]    Report Status PENDING  Incomplete  MRSA Next Gen by PCR, Nasal     Status: None   Collection Time: 09/28/23  9:23 AM   Specimen: Nasal Mucosa; Nasal Swab  Result Value Ref Range Status   MRSA by PCR Next Gen NOT DETECTED NOT DETECTED Final    Comment: (NOTE) The GeneXpert MRSA Assay (FDA approved for NASAL specimens only), is one component of a comprehensive MRSA colonization surveillance program. It is not intended to diagnose MRSA infection nor to guide or monitor treatment for MRSA infections. Test performance is not FDA approved in patients less than 12 years old. Performed at [facility], [address] [place], [place] [address]      Serology:   Imaging: If present, new imagings (plain films, ct scans, and mri) have been personally visualized and interpreted; radiology reports have been reviewed. Decision making incorporated into the Impression / Recommendations.  See others in a&p  9/8 tte  1. Left ventricular ejection fraction, by estimation, is 60 to 65%. The  left ventricle has normal function. The left ventricle has no regional  wall motion abnormalities. Left ventricular diastolic parameters were  normal.   2. Right ventricular systolic function is normal. The right ventricular  size is normal. There is mildly elevated pulmonary artery systolic  pressure.   3. Left atrial size was mildly  dilated.   4. The mitral valve is abnormal. Trivial mitral valve regurgitation. No   evidence of mitral stenosis.   5. The aortic valve is tricuspid. There is mild calcification of the  aortic valve. Aortic valve regurgitation is not visualized. Aortic valve  sclerosis is present, with no evidence of aortic valve stenosis.   6. The inferior vena cava is normal in size with greater than 50%  respiratory variability, suggesting right atrial pressure of 3 mmHg.   Constance ONEIDA Passer, MD [facility] [phone] pager    09/28/2023, 3:18 PM

## 2023-09-28 NOTE — Progress Notes (Signed)
 UNASSIGNED PATIENT Subjective: Patient seen and examined chart reviewed.Patient admitted for sepsis with a history complicated with anxiety, depression,  back pain polysubstance abuse complains of some right lower quadrant pain. Longstanding history of drug abuse with the last dose of drugs taken morning prior to admission when she used Fentanyl . Noted to have significant elevated alkaline phosphatase levels with a reactive HIV test with a confirmation pending. Urine drug screen positive for amphetamines and fentanyl  lower extremity necrotizing fasciitis/cellulitis.  Chest x-ray done today shows marked increased alveolar interstitial markings throughout the lungs bilaterally suspicious for pulmonary edema/ARDS versus multilobular pneumonia.  Objective: Vital signs in last 24 hours: Temp:  [97.5 F (36.4 C)-101.5 F (38.6 C)] 99.3 F (37.4 C) (09/08 0250) Pulse Rate:  [103-127] 110 (09/07 2112) Resp:  [16-24] 20 (09/08 0250) BP: (106-138)/(74-90) 127/87 (09/08 0250) SpO2:  [92 %-98 %] 92 % (09/08 0550) Last BM Date : 09/26/23  Intake/Output from previous day: 09/07 0701 - 09/08 0700 In: 1738.6 [P.O.:240; I.V.:765.2; IV Piggyback:733.4] Out: 200 [Urine:200] Intake/Output this shift: No intake/output data recorded.  General appearance: cooperative, appears older than stated age, extremely SOB with increased work of breathing, morbidly obese, and slowed mentation Resp: Decreased breath sounds at the bases otherwise clear to auscultation  Cardio: S1-S2 regular but tachycardic no murmur, click, rub or gallop GI: Morbidly obese with mild left lower quadrant tenderness on palpation without guarding rebound or rigidity  2+ bilateral lower extremity edema  Lab Results: Recent Labs    09/26/23 1545 09/27/23 0711 09/28/23 0237  WBC 5.3 6.1 6.4  HGB 10.1* 8.8* 8.9*  HCT 33.2* 28.3* 28.8*  PLT 137* 157 176   BMET Recent Labs    09/26/23 0921 09/27/23 0711 09/28/23 0237  NA 134* 133*  134*  K 4.6 4.5 4.2  CL 106 104 105  CO2 21* 20* 19*  GLUCOSE 86 98 103*  BUN 22* 18 15  CREATININE 0.84 0.79 0.66  CALCIUM  7.6* 7.9* 7.7*   LFT Recent Labs    09/27/23 0711  PROT 6.7  ALBUMIN  2.1*  AST 151*  ALT 52*  ALKPHOS 1,122*  BILITOT 2.9*   PT/INR Recent Labs    09/26/23 1545  LABPROT 14.1  INR 1.0   Hepatitis Panel Recent Labs    09/27/23 0711  HEPBSAG NON REACTIVE  HCVAB Reactive*  HEPAIGM NON REACTIVE  HEPBIGM NON REACTIVE   Studies/Results: VAS US  LOWER EXTREMITY VENOUS (DVT) Result Date: 09/27/2023  Lower Venous DVT Study Patient Name:  [name] [name]  Date of Exam:   09/26/2023 Medical Rec #: [id number]      Accession #:    [id number] Date of Birth: [birth year]      Patient Gender: F Patient Age:   43 years Exam Location:  [facility] Procedure:      VAS US  LOWER EXTREMITY VENOUS (DVT) Referring Phys: DAVID ORTIZ --------------------------------------------------------------------------------  Indications: Swelling, Pain, Edema, and history of necrotizing fascitis.  Limitations: Body habitus and poor ultrasound/tissue interface. Comparison Study: No prior exam. Performing Technologist: Edilia Elden Appl  Examination Guidelines: A complete evaluation includes B-mode imaging, spectral Doppler, color Doppler, and power Doppler as needed of all accessible portions of each vessel. Bilateral testing is considered an integral part of a complete examination. Limited examinations for reoccurring indications may be performed as noted. The reflux portion of the exam is performed with the patient in reverse Trendelenburg.  +---------+---------------+---------+-----------+----------+-------------------+ RIGHT    CompressibilityPhasicitySpontaneityPropertiesThrombus Aging      +---------+---------------+---------+-----------+----------+-------------------+ CFV  Full           Yes      Yes                                       +---------+---------------+---------+-----------+----------+-------------------+ SFJ      Full           Yes      Yes                                      +---------+---------------+---------+-----------+----------+-------------------+ FV Prox  Full                                                             +---------+---------------+---------+-----------+----------+-------------------+ FV Mid   Full                                                             +---------+---------------+---------+-----------+----------+-------------------+ FV DistalFull                                                             +---------+---------------+---------+-----------+----------+-------------------+ PFV      Full                                                             +---------+---------------+---------+-----------+----------+-------------------+ POP      Full           Yes      Yes                                      +---------+---------------+---------+-----------+----------+-------------------+ PTV                                                   Not well visualized +---------+---------------+---------+-----------+----------+-------------------+ PERO     Full                                                             +---------+---------------+---------+-----------+----------+-------------------+ Right posterior tibial veins not visualized on this exam.  +---------+---------------+---------+-----------+----------+--------------+ LEFT     CompressibilityPhasicitySpontaneityPropertiesThrombus Aging +---------+---------------+---------+-----------+----------+--------------+ CFV      Full  Yes      Yes                                 +---------+---------------+---------+-----------+----------+--------------+ SFJ      Full           Yes      Yes                                  +---------+---------------+---------+-----------+----------+--------------+ FV Prox  Full                                                        +---------+---------------+---------+-----------+----------+--------------+ FV Mid   Full                                                        +---------+---------------+---------+-----------+----------+--------------+ FV DistalFull                                                        +---------+---------------+---------+-----------+----------+--------------+ PFV      Full                                                        +---------+---------------+---------+-----------+----------+--------------+ POP      Full           Yes      Yes                                 +---------+---------------+---------+-----------+----------+--------------+ PTV      Full                                                        +---------+---------------+---------+-----------+----------+--------------+ PERO     Full                                                        +---------+---------------+---------+-----------+----------+--------------+     Summary: RIGHT: - There is no evidence of deep vein thrombosis in the lower extremity. However, portions of this examination were limited- see technologist comments above.  - No cystic structure found in the popliteal fossa.  LEFT: - There is no evidence of deep vein thrombosis in the lower extremity.  - No cystic structure found in the popliteal fossa.  *See table(s) above for measurements and observations. Electronically signed by Gaile New  MD on 09/27/2023 at 8:25:28 PM.    Final    NM Hepatobiliary Liver Func Result Date: 09/27/2023 CLINICAL DATA:  Right upper quadrant abdominal pain. EXAM: NUCLEAR MEDICINE HEPATOBILIARY IMAGING TECHNIQUE: Sequential images of the abdomen were obtained out to 60 minutes following intravenous administration of radiopharmaceutical. RADIOPHARMACEUTICALS:   7.21 mCi Tc-14m  Choletec  IV COMPARISON:  None Available. FINDINGS: Prompt uptake and biliary excretion of activity by the liver is seen. Gallbladder activity is visualized, consistent with patency of cystic duct. Biliary activity passes into small bowel, consistent with patent common bile duct. IMPRESSION: Normal nuclear medicine hepatobiliary scan. Electronically Signed   By: Suzen Dials M.D.   On: 09/27/2023 16:04   CT Angio Chest Pulmonary Embolism (PE) W or WO Contrast Result Date: 09/26/2023 CLINICAL DATA:  Shortness of breath. Acute bilateral lower extremity swelling, fever. EXAM: CT ANGIOGRAPHY CHEST WITH CONTRAST TECHNIQUE: Multidetector CT imaging of the chest was performed using the standard protocol during bolus administration of intravenous contrast. Multiplanar CT image reconstructions and MIPs were obtained to evaluate the vascular anatomy. RADIATION DOSE REDUCTION: This exam was performed according to the departmental dose-optimization program which includes automated exposure control, adjustment of the mA and/or kV according to patient size and/or use of iterative reconstruction technique. CONTRAST:  75mL OMNIPAQUE  IOHEXOL  350 MG/ML SOLN COMPARISON:  November 02, 2018. FINDINGS: Cardiovascular: Satisfactory opacification of the pulmonary arteries to the segmental level. No evidence of pulmonary embolism. Normal heart size. No pericardial effusion. Mediastinum/Nodes: No significant mediastinal adenopathy is noted. Mildly enlarged bilateral axillary lymph nodes are noted which most likely are inflammatory or reactive in etiology. Thyroid gland, trachea, and esophagus demonstrate no significant findings. Lungs/Pleura: No pneumothorax or pleural effusion is noted. Diffuse patchy and interstitial opacities are noted throughout both lungs concerning for edema or possibly multifocal pneumonia. Upper Abdomen: Hepatic steatosis. Musculoskeletal: No chest wall abnormality. No acute or significant  osseous findings. Review of the MIP images confirms the above findings. IMPRESSION: 1. No definite evidence of pulmonary embolus. 2. Diffuse patchy and interstitial opacities are noted throughout both lungs concerning for edema or possibly multifocal pneumonia. 3. Hepatic steatosis. Electronically Signed   By: Lynwood Landy Raddle M.D.   On: 09/26/2023 14:01   Medications: I have reviewed the patient's current medications. Prior to Admission:  No medications prior to admission.   Scheduled:  Chlorhexidine  Gluconate Cloth  6 each Topical Daily   cloNIDine   0.1 mg Oral QID   Followed by   NOREEN ON 09/29/2023] cloNIDine   0.1 mg Oral BH-qamhs   Followed by   NOREEN ON 10/01/2023] cloNIDine   0.1 mg Oral QAC breakfast   furosemide   40 mg Intravenous Q12H   influenza vac split trivalent PF  0.5 mL Intramuscular Tomorrow-1000   Continuous:  cefTRIAXone  (ROCEPHIN )  IV Stopped (09/28/23 1247)   metronidazole  Stopped (09/28/23 1401)   Assessment/Plan: ? Sepsis with multifocal pneumonia-I suspect her liver enzymes are elevated due to the sepsis-suspect sepsis associated cholestasis. This can be due to hypoxic injury or direct hepatocellular injury. Additionally pre-existing liver disease can be unmasked she has a history of hepatic steatosis along with splenomegaly. 2) Anasarca with splenomegaly and fatty liver.  3) Polysubstance drug abuse-with preliminary HIV testing being positive HCV antibody positive.  HCVRNA pending RPR  Pending. 4) Right lower extremity cellulitis-antibiotics. 5) Anxiety disorder/major depressive disorder. 6) Ultrasound shows no evidence of cholelithiasis CBD is mildly dilated 7 mm HIDA scan is negative.  LOS: 2 days   Renaye Sous 09/28/2023, 7:48 AM

## 2023-09-28 NOTE — TOC Initial Note (Addendum)
 Transition of Care [facility]) - Initial/Assessment Note    Patient Details  Name: [name] [name] MRN: [id number] Date of Birth: May 25, 1980  [facility]) CM/SW Contact:    Jon ONEIDA Anon, RN Phone Number: 09/28/2023, 3:30 PM  Clinical Narrative:                 Pt is from home. Currently requiring HFNC O2 @ 9L. Needing continued medical work up, not medically stable for DC. IP Care Management is following for any new recommendations or for any DC needs.     Expected Discharge Plan: Home/Self Care Barriers to Discharge: Continued Medical Work up   Patient Goals and CMS Choice Patient states their goals for this hospitalization and ongoing recovery are:: Home CMS Medicare.gov Compare Post Acute Care list provided to:: Other (Comment Required) (NA) Choice offered to / list presented to : NA [place] ownership interest in [facility].provided to:: Parent NA    Expected Discharge Plan and Services In-house Referral: NA Discharge Planning Services: CM Consult Post Acute Care Choice: NA Living arrangements for the past 2 months: Single Family Home                 DME Arranged: N/A DME Agency: NA       HH Arranged: NA HH Agency: NA        Prior Living Arrangements/Services Living arrangements for the past 2 months: Single Family Home Lives with:: Significant Other Patient language and need for interpreter reviewed:: Yes Do you feel safe going back to the place where you live?: Yes      Need for Family Participation in Patient Care: No (Comment) Care giver support system in place?: Yes (comment) Current home services: Other (comment) (NA) Criminal Activity/Legal Involvement Pertinent to Current Situation/Hospitalization: No - Comment as needed  Activities of Daily Living   ADL Screening (condition at time of admission) Independently performs ADLs?: Yes (appropriate for developmental age) Is the patient deaf or have difficulty hearing?: No Does the  patient have difficulty seeing, even when wearing glasses/contacts?: No Does the patient have difficulty concentrating, remembering, or making decisions?: No  Permission Sought/Granted Permission sought to share information with : Family Supports Permission granted to share information with : Yes, Verbal Permission Granted  Share Information with NAME: Geofm Ade (Significant other)  [phone]           Emotional Assessment Appearance:: Appears stated age Attitude/Demeanor/Rapport: Unable to Assess Affect (typically observed): Unable to Assess Orientation: : Oriented to Self, Oriented to Place, Oriented to  Time, Oriented to Situation Alcohol  / Substance Use: Illicit Drugs Psych Involvement: No (comment)  Admission diagnosis:  Elevated LFTs [R79.89] Patient Active Problem List   Diagnosis Date Noted   Elevated LFTs 09/26/2023   Cellulitis of both lower extremities 09/26/2023   Normocytic anemia 09/26/2023   Protein-calorie malnutrition, severe (HCC) 09/26/2023   Hyponatremia 09/26/2023   Thrombocytopenia (HCC) 09/26/2023   Necrotizing fasciitis due to Streptococcus pyogenes (HCC)    Wound infection 10/21/2019   Cellulitis 10/21/2019   Substance abuse (HCC) 10/21/2019   Anxiety 10/21/2019   Fever    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   Paresthesia of both legs    [name]    Urinary retention    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   IVDU (intravenous drug user)    Septic embolism (HCC)    Loculated pleural effusion 10/20/2018   RUQ pain 04/06/2018   Breast mass, right 10/12/2017   Neuropathy 07/14/2017  Depression 05/22/2015   MDD (major depressive disorder), recurrent severe, 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   PCP:  Pcp, No Pharmacy:   [facility] [phone] - [place], [place] - [address] AT [facility] [address] [place]  [address] Phone: [phone] Fax: [phone]  [facility] #[id number] [place], [place] - [address] AT [facility] [address] [place] [address] Phone: [phone] Fax: [phone]     Social Drivers of Health (SDOH) Social History: SDOH Screenings   Food Insecurity: No Food Insecurity (09/26/2023)  Housing: Patient Declined (09/26/2023)  Transportation Needs: No Transportation Needs (09/26/2023)  Utilities: Not At Risk (09/26/2023)  Depression (PHQ2-9): Medium Risk (04/06/2018)  Tobacco Use: High Risk (09/27/2023)   SDOH Interventions:     Readmission Risk Interventions    09/28/2023    3:19 PM  Readmission Risk Prevention Plan  Transportation Screening Complete  PCP or Specialist Appt within 5-7 Days Complete  Home Care Screening Complete  Medication Review (RN CM) Complete

## 2023-09-28 NOTE — Progress Notes (Signed)
 PROGRESS NOTE  [name] [name]  FMW:[id number] DOB: [birth year] DOA: 09/25/2023 PCP: Pcp, No   Brief Narrative: Patient is a 43 year old female with history of anxiety, depression, chronic back pain, cocaine abuse, alcohol  abuse, opiate use disorder, septic emboli, spinal epidural abscess who presented to the emergency department with complaint of fever, lower extremity swelling, right upper quadrant pain.  On presentation she was hypertensive, tachycardic but afebrile.  CT/x-ray of left lower extremity showed nonspecific edema which could be from cellulitis or postoperative changes due to her history of necrotizing fasciitis.  CT abdomen/pelvis showed findings suspicious for acute cholecystitis, mild dilation of CBD, no cholelithiasis or gallbladder wall edema.  CT imaging of the chest showed diffuse patchy bilateral interstitial opacities consistent with multifocal pneumonia.  Lab work showed elevated liver enzymes, ALP.  Ultrasound of the right upper quadrant showed hepatic steatosis, splenomegaly.  Suspected sepsis so started on broad antibiotics.  Had to be transferred to  stepdown today because of severe sinus tachycardia, withdrawal symptoms  Assessment & Plan:  Principal Problem:   Elevated LFTs Active Problems:   MDD (major depressive disorder), recurrent severe, without psychosis (HCC)   RUQ pain   Substance abuse (HCC)   Cellulitis of both lower extremities   Normocytic anemia   Protein-calorie malnutrition, severe (HCC)   Hyponatremia   Thrombocytopenia (HCC)  Suspected sepsis: Presented with fever, tachycardia.  No leukocytosis.  Has erythema and edema of right lower extremity.  Likely cellulitis.  She had surgery for necrotizing fasciitis of left lower extremity in 2021.  She still has some sutures there .Dr Harden had operated on her.  Will follow-up with Dr. Harden if we can remove the staples Imaging showed bilateral possible multifocal pneumonia.  Continue current antibiotics for now.   Follow-up cultures.  One of the blood culture set showed staph species, Staphylococcus epidermidis, contamination suspected but true infection not excluded because of history of drug abuse.  Repeat culture sent urine.  ID consulted.  Continue current antibiotics.  Urine cultures showed Serratia marcescens Remains in sinus tachycardia.  Afebrile today.  IV fluid stopped due to high oxygen requirement.  Acute hypoxic respiratory failure/multifocal pneumonia: Continue current antibiotics.  Continue bronchodilators as needed.  Currently on high flow oxygen. Chest x-ray done today showed markedly increased alveolar and interstitial markings throughout bilateral lungs. Suspicious for  congestive heart failure/pulmonary edema, ARDS or multilobar pneumonia.  Continue current antibiotics.  Will continue Lasix  IV 40 mg twice daily for now.  Follow-up echocardiogram.  Right upper quadrant pain/elevated LFTs: Presented with fever, right upper quadrant pain, tachycardia.  She complains of right upper quadrant pain radiating to her right thigh.  Lab work showed mild elevated liver enzymes, elevated ALP. CT abdomen/pelvis showed findings suspicious for acute cholecystitis, mild dilation of CBD, no cholelithiasis or gallbladder wall edema.  Ultrasound of the right upper quadrant showed hepatic steatosis, splenomegaly.  HIDA scan does not suggest cholecystitis We might need  to order MRCP.  Will defer to GI.  ALP, liver enzymes, bilirubin remain mildly elevated. ID ordered EBV, CMV PCR  History of substance abuse: Injects fentanyl .  Counseled for cessation.  She will need to be followed by Suboxone /methadone clinic on discharge.  Currently on withdrawal.  Continue pain medications in  the interim  Normocytic anemia: Current hemoglobin stable.  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  ID consulting.  Ordered PCR, CD4, RPR,  gonorrhea, chlamydia, hepatitis panel  DVT prophylaxis:SCDs Start: 09/26/23 9094     Code Status: Full Code  Family Communication: None at bedside  Patient status:Inpatient  Patient is from :home  Anticipated discharge to:not sure  Estimated DC date:not sure   Consultants: ID,GI  Procedures:None  Antimicrobials:  Anti-infectives (From admission, onward)    Start     Dose/Rate Route Frequency Ordered Stop   09/27/23 1200  vancomycin  (VANCOREADY) IVPB 1250 mg/250 mL  Status:  Discontinued        1,250 mg 166.7 mL/hr over 90 Minutes Intravenous Every 24 hours 09/26/23 1208 09/27/23 1058   09/27/23 1130  vancomycin  (VANCOCIN ) IVPB 1000 mg/200 mL premix        1,000 mg 200 mL/hr over 60 Minutes Intravenous 2 times daily 09/27/23 1058     09/26/23 1215  vancomycin  (VANCOCIN ) IVPB 1000 mg/200 mL premix        1,000 mg 200 mL/hr over 60 Minutes Intravenous  Once 09/26/23 1208 09/26/23 1407   09/26/23 1200  cefTRIAXone  (ROCEPHIN ) 2 g in sodium chloride  0.9 % 100 mL IVPB        2 g 200 mL/hr over 30 Minutes Intravenous Every 24 hours 09/26/23 1122     09/26/23 1200  metroNIDAZOLE  (FLAGYL ) IVPB 500 mg        500 mg 100 mL/hr over 60 Minutes Intravenous Every 12 hours 09/26/23 1122     09/26/23 0000  vancomycin  (VANCOCIN ) IVPB 1000 mg/200 mL premix        1,000 mg 200 mL/hr over 60 Minutes Intravenous  Once 09/25/23 2359 09/26/23 0225   09/26/23 0000  ceFEPIme  (MAXIPIME ) 2 g in sodium chloride  0.9 % 100 mL IVPB        2 g 200 mL/hr over 30 Minutes Intravenous  Once 09/25/23 2359 09/26/23 0130       Subjective: Patient seen and examined at bedside today.  She was requesting for pain medications.  She says the pain is everywhere.  She is currently alert oriented.  But requiring high flow oxygen.  Remains in severe sinus tachycardia.  Patient being transferred to stepdown. Right lower extremity cellulitis looks better today with less edema of the lower  extremities.  Objective: Vitals:   09/28/23 0250 09/28/23 0550 09/28/23 0800 09/28/23 0814  BP: 127/87   128/82  Pulse:      Resp: 20  (!) 36 (!) 28  Temp: 99.3 F (37.4 C)   98.1 F (36.7 C)  TempSrc: Oral   Oral  SpO2: 92% 92%  100%  Weight:      Height:        Intake/Output Summary (Last 24 hours) at 09/28/2023 1052 Last data filed at 09/28/2023 1032 Gross per 24 hour  Intake 1738.58 ml  Output 1100 ml  Net 638.58 ml   Filed Weights   09/26/23 1441  Weight: 109.1 kg    Examination:   General exam: Irritable, anxious, obese HEENT: PERRL Respiratory system: Diminished air sounds bilaterally Cardiovascular system: Sinus tachycardia Gastrointestinal system: Abdomen is nondistended, soft and nontender.  Bowel sounds heard Central nervous system: Alert and oriented Extremities: Edema bilateral lower extremities, erythematous changes on the right lower extremity, old necrotizing fasciitis scar on the left seen with metallic sutures  skin: Scattered rash, track marks on both arms     Data Reviewed: I have personally reviewed following labs and imaging studies  CBC: Recent Labs  Lab 09/26/23 0000 09/26/23 1545 09/27/23 0711 09/28/23 0237  WBC 4.9 5.3 6.1 6.4  NEUTROABS  3.1  --   --   --   HGB 8.8* 10.1* 8.8* 8.9*  HCT 28.0* 33.2* 28.3* 28.8*  MCV 81.2 82.8 81.1 81.4  PLT 145* 137* 157 176   Basic Metabolic Panel: Recent Labs  Lab 09/25/23 2251 09/26/23 0921 09/27/23 0711 09/28/23 0237  NA 135 134* 133* 134*  K 5.1 4.6 4.5 4.2  CL 105 106 104 105  CO2 21* 21* 20* 19*  GLUCOSE 93 86 98 103*  BUN 23* 22* 18 15  CREATININE 0.83 0.84 0.79 0.66  CALCIUM  8.3* 7.6* 7.9* 7.7*  MG  --  2.2  --   --   PHOS  --  3.0  --   --      Recent Results (from the past 240 hours)  Resp panel by RT-PCR (RSV, Flu A&B, Covid) Anterior Nasal Swab     Status: None   Collection Time: 09/25/23  9:54 PM   Specimen: Anterior Nasal Swab  Result Value Ref Range Status   SARS  Coronavirus 2 by RT PCR NEGATIVE NEGATIVE Final    Comment: (NOTE) SARS-CoV-2 target nucleic acids are NOT DETECTED.  The SARS-CoV-2 RNA is generally detectable in upper respiratory specimens during the acute phase of infection. The lowest concentration of SARS-CoV-2 viral copies this assay can detect is 138 copies/mL. A negative result does not preclude SARS-Cov-2 infection and should not be used as the sole basis for treatment or other patient management decisions. A negative result may occur with  improper specimen collection/handling, submission of specimen other than nasopharyngeal swab, presence of viral mutation(s) within the areas targeted by this assay, and inadequate number of viral copies(<138 copies/mL). A negative result must be combined with clinical observations, patient history, and epidemiological information. The expected result is Negative.  Fact Sheet for Patients:  BloggerCourse.com  Fact Sheet for Healthcare Providers:  SeriousBroker.it  This test is no t yet approved or cleared by the [place]  FDA and  has been authorized for detection and/or diagnosis of SARS-CoV-2 by FDA under an Emergency Use Authorization (EUA). This EUA will remain  in effect (meaning this test can be used) for the duration of the COVID-19 declaration under Section 564(b)(1) of the Act, 21 U.S.C.section 360bbb-3(b)(1), unless the authorization is terminated  or revoked sooner.       Influenza A by PCR NEGATIVE NEGATIVE Final   Influenza B by PCR NEGATIVE NEGATIVE Final    Comment: (NOTE) The Xpert Xpress SARS-CoV-2/FLU/RSV plus assay is intended as an aid in the diagnosis of influenza from Nasopharyngeal swab specimens and should not be used as a sole basis for treatment. Nasal washings and aspirates are unacceptable for Xpert Xpress SARS-CoV-2/FLU/RSV testing.  Fact Sheet for  Patients: BloggerCourse.com  Fact Sheet for Healthcare Providers: SeriousBroker.it  This test is not yet approved or cleared by the [place]  FDA and has been authorized for detection and/or diagnosis of SARS-CoV-2 by FDA under an Emergency Use Authorization (EUA). This EUA will remain in effect (meaning this test can be used) for the duration of the COVID-19 declaration under Section 564(b)(1) of the Act, 21 U.S.C. section 360bbb-3(b)(1), unless the authorization is terminated or revoked.     Resp Syncytial Virus by PCR NEGATIVE NEGATIVE Final    Comment: (NOTE) Fact Sheet for Patients: BloggerCourse.com  Fact Sheet for Healthcare Providers: SeriousBroker.it  This test is not yet approved or cleared by the [place]  FDA and has been authorized for detection and/or diagnosis of SARS-CoV-2 by FDA under an  Emergency Use Authorization (EUA). This EUA will remain in effect (meaning this test can be used) for the duration of the COVID-19 declaration under Section 564(b)(1) of the Act, 21 U.S.C. section 360bbb-3(b)(1), unless the authorization is terminated or revoked.  Performed at [facility], [address] [place], [place] [address]   Culture, blood (Routine X 2) w Reflex to ID Panel     Status: None (Preliminary result)   Collection Time: 09/25/23  9:54 PM   Specimen: BLOOD  Result Value Ref Range Status   Specimen Description   Final    BLOOD BLOOD RIGHT ARM Performed at [facility], [address] [place], [place] [address]    Special Requests   Final    BOTTLES DRAWN AEROBIC AND ANAEROBIC Blood Culture results may not be optimal due to an inadequate volume of blood received in culture bottles Performed at [facility], [address], [place], [place] [address]    Culture   Final    NO GROWTH 2  DAYS Performed at [facility], [address], [place], [place] [address]    Report Status PENDING  Incomplete  Culture, blood (Routine X 2) w Reflex to ID Panel     Status: None (Preliminary result)   Collection Time: 09/25/23  9:54 PM   Specimen: BLOOD LEFT ARM  Result Value Ref Range Status   Specimen Description   Final    BLOOD LEFT ARM Performed at [facility], [address], [place], [place] [address]    Special Requests   Final    BOTTLES DRAWN AEROBIC AND ANAEROBIC Blood Culture adequate volume Performed at [facility], [address], [place], [place] [address]    Culture  Setup Time   Final    GRAM POSITIVE COCCI IN CLUSTERS IN BOTH AEROBIC AND ANAEROBIC BOTTLES CRITICAL RESULT CALLED TO, READ BACK BY AND VERIFIED WITH: PHARMD E JACKSON 09/26/2023 @ 2338 BY AB    Culture   Final    CULTURE REINCUBATED FOR BETTER GROWTH Performed at [facility], [address], [place], [place] [address]    Report Status PENDING  Incomplete  Urine Culture     Status: Abnormal   Collection Time: 09/25/23  9:54 PM   Specimen: Urine, Random  Result Value Ref Range Status   Specimen Description   Final    URINE, RANDOM Performed at [facility], [address] [place], [place] [address]    Special Requests   Final    NONE Reflexed from [id number] Performed at [facility], [address], [place], [place] [address]    Culture >=100,000 COLONIES/mL SERRATIA MARCESCENS (A)  Final   Report Status 09/28/2023 FINAL  Final   Organism ID, Bacteria SERRATIA MARCESCENS (A)  Final      Susceptibility   Serratia marcescens - MIC*    CEFEPIME  <=0.12 SENSITIVE Sensitive     ERTAPENEM <=0.12 SENSITIVE Sensitive     CEFTRIAXONE  <=0.25 SENSITIVE Sensitive     CIPROFLOXACIN  <=0.06 SENSITIVE Sensitive     GENTAMICIN <=1 SENSITIVE Sensitive     NITROFURANTOIN  128 RESISTANT Resistant     TRIMETH/SULFA <=20 SENSITIVE  Sensitive     MEROPENEM <=0.25 SENSITIVE Sensitive     * >=100,000 COLONIES/mL SERRATIA MARCESCENS  Blood Culture ID Panel (Reflexed)     Status: Abnormal   Collection Time: 09/25/23  9:54 PM  Result Value Ref Range Status  Enterococcus faecalis NOT DETECTED NOT DETECTED Final   Enterococcus Faecium NOT DETECTED NOT DETECTED Final   Listeria monocytogenes NOT DETECTED NOT DETECTED Final   Staphylococcus species DETECTED (A) NOT DETECTED Final    Comment: CRITICAL RESULT CALLED TO, READ BACK BY AND VERIFIED WITH: PHARMD E JACKSON 09/26/2023 @ 2338 BY AB    Staphylococcus aureus (BCID) NOT DETECTED NOT DETECTED Final   Staphylococcus epidermidis DETECTED (A) NOT DETECTED Final    Comment: CRITICAL RESULT CALLED TO, READ BACK BY AND VERIFIED WITH: PHARMD E JACKSON 09/26/2023 @ 2338 BY AB    Staphylococcus lugdunensis NOT DETECTED NOT DETECTED Final   Streptococcus species NOT DETECTED NOT DETECTED Final   Streptococcus agalactiae NOT DETECTED NOT DETECTED Final   Streptococcus pneumoniae NOT DETECTED NOT DETECTED Final   Streptococcus pyogenes NOT DETECTED NOT DETECTED Final   A.calcoaceticus-baumannii NOT DETECTED NOT DETECTED Final   Bacteroides fragilis NOT DETECTED NOT DETECTED Final   Enterobacterales NOT DETECTED NOT DETECTED Final   Enterobacter cloacae complex NOT DETECTED NOT DETECTED Final   Escherichia coli NOT DETECTED NOT DETECTED Final   Klebsiella aerogenes NOT DETECTED NOT DETECTED Final   Klebsiella oxytoca NOT DETECTED NOT DETECTED Final   Klebsiella pneumoniae NOT DETECTED NOT DETECTED Final   Proteus species NOT DETECTED NOT DETECTED Final   Salmonella species NOT DETECTED NOT DETECTED Final   Serratia marcescens NOT DETECTED NOT DETECTED Final   Haemophilus influenzae NOT DETECTED NOT DETECTED Final   Neisseria meningitidis NOT DETECTED NOT DETECTED Final   Pseudomonas aeruginosa NOT DETECTED NOT DETECTED Final   Stenotrophomonas maltophilia NOT DETECTED NOT  DETECTED Final   Candida albicans NOT DETECTED NOT DETECTED Final   Candida auris NOT DETECTED NOT DETECTED Final   Candida glabrata NOT DETECTED NOT DETECTED Final   Candida krusei NOT DETECTED NOT DETECTED Final   Candida parapsilosis NOT DETECTED NOT DETECTED Final   Candida tropicalis NOT DETECTED NOT DETECTED Final   Cryptococcus neoformans/gattii NOT DETECTED NOT DETECTED Final   Methicillin resistance mecA/C NOT DETECTED NOT DETECTED Final    Comment: Performed at [facility], [address], [place], [place] [address]  Culture, blood (Routine X 2) w Reflex to ID Panel     Status: None (Preliminary result)   Collection Time: 09/27/23  9:56 AM   Specimen: BLOOD RIGHT ARM  Result Value Ref Range Status   Specimen Description   Final    BLOOD RIGHT ARM Performed at [facility], [address] [place], [place] [address]    Special Requests   Final    BOTTLES DRAWN AEROBIC AND ANAEROBIC Blood Culture results may not be optimal due to an inadequate volume of blood received in culture bottles Performed at [facility], [address], [place], [place] [address]    Culture   Final    NO GROWTH < 24 HOURS Performed at [facility], [address], [place], [place] [address]    Report Status PENDING  Incomplete  Culture, blood (Routine X 2) w Reflex to ID Panel     Status: None (Preliminary result)   Collection Time: 09/27/23  9:56 AM   Specimen: BLOOD RIGHT HAND  Result Value Ref Range Status   Specimen Description   Final    BLOOD RIGHT HAND Performed at [facility], [address], [place], [place] [address]    Special Requests   Final    BOTTLES DRAWN AEROBIC AND ANAEROBIC Blood Culture  results may not be optimal due to an inadequate volume of blood received in culture bottles Performed at [facility], [address], [place], [place] [address]    Culture   Final    NO GROWTH < 24 HOURS Performed at [facility], [address] [place], [place] [address]    Report Status PENDING  Incomplete     Radiology Studies: DG CHEST PORT 1 VIEW Result Date: 09/28/2023 CLINICAL DATA:  Shortness of breath. EXAM: PORTABLE CHEST 1 VIEW COMPARISON:  09/25/2023. FINDINGS: Low lung volume. There are markedly increased alveolar and interstitial markings throughout bilateral lungs. Findings are nonspecific and differential diagnosis includes congestive heart failure/pulmonary edema, ARDS or multilobar pneumonia. Correlate clinically. Bilateral costophrenic angles are clear. Stable cardio-mediastinal silhouette. No acute osseous abnormalities. The soft tissues are within normal limits. IMPRESSION: *Markedly increased alveolar and interstitial markings throughout bilateral lungs. Findings are nonspecific and differential diagnosis includes congestive heart failure/pulmonary edema, ARDS or multilobar pneumonia. Electronically Signed   By: Ree Molt M.D.   On: 09/28/2023 08:26   VAS US  LOWER EXTREMITY VENOUS (DVT) Result Date: 09/27/2023  Lower Venous DVT Study Patient Name:  [name] [name]  Date of Exam:   09/26/2023 Medical Rec #: [id number]      Accession #:    [id number] Date of Birth: Jan 13, 1981      Patient Gender: F Patient Age:   89 years Exam Location:  [facility] Procedure:      VAS US  LOWER EXTREMITY VENOUS (DVT) Referring Phys: DAVID ORTIZ --------------------------------------------------------------------------------  Indications: Swelling, Pain, Edema, and history of necrotizing fascitis.  Limitations: Body habitus and poor ultrasound/tissue interface. Comparison Study: No prior exam. Performing Technologist: Edilia Elden Appl  Examination Guidelines: A complete evaluation includes B-mode imaging, spectral Doppler, color Doppler, and power Doppler as needed of all accessible portions of each vessel. Bilateral testing is considered an integral part of a complete examination. Limited examinations for  reoccurring indications may be performed as noted. The reflux portion of the exam is performed with the patient in reverse Trendelenburg.  +---------+---------------+---------+-----------+----------+-------------------+ RIGHT    CompressibilityPhasicitySpontaneityPropertiesThrombus Aging      +---------+---------------+---------+-----------+----------+-------------------+ CFV      Full           Yes      Yes                                      +---------+---------------+---------+-----------+----------+-------------------+ SFJ      Full           Yes      Yes                                      +---------+---------------+---------+-----------+----------+-------------------+ FV Prox  Full                                                             +---------+---------------+---------+-----------+----------+-------------------+ FV Mid   Full                                                             +---------+---------------+---------+-----------+----------+-------------------+  FV DistalFull                                                             +---------+---------------+---------+-----------+----------+-------------------+ PFV      Full                                                             +---------+---------------+---------+-----------+----------+-------------------+ POP      Full           Yes      Yes                                      +---------+---------------+---------+-----------+----------+-------------------+ PTV                                                   Not well visualized +---------+---------------+---------+-----------+----------+-------------------+ PERO     Full                                                             +---------+---------------+---------+-----------+----------+-------------------+ Right posterior tibial veins not visualized on this exam.   +---------+---------------+---------+-----------+----------+--------------+ LEFT     CompressibilityPhasicitySpontaneityPropertiesThrombus Aging +---------+---------------+---------+-----------+----------+--------------+ CFV      Full           Yes      Yes                                 +---------+---------------+---------+-----------+----------+--------------+ SFJ      Full           Yes      Yes                                 +---------+---------------+---------+-----------+----------+--------------+ FV Prox  Full                                                        +---------+---------------+---------+-----------+----------+--------------+ FV Mid   Full                                                        +---------+---------------+---------+-----------+----------+--------------+ FV DistalFull                                                        +---------+---------------+---------+-----------+----------+--------------+  PFV      Full                                                        +---------+---------------+---------+-----------+----------+--------------+ POP      Full           Yes      Yes                                 +---------+---------------+---------+-----------+----------+--------------+ PTV      Full                                                        +---------+---------------+---------+-----------+----------+--------------+ PERO     Full                                                        +---------+---------------+---------+-----------+----------+--------------+     Summary: RIGHT: - There is no evidence of deep vein thrombosis in the lower extremity. However, portions of this examination were limited- see technologist comments above.  - No cystic structure found in the popliteal fossa.  LEFT: - There is no evidence of deep vein thrombosis in the lower extremity.  - No cystic structure found in the popliteal  fossa.  *See table(s) above for measurements and observations. Electronically signed by Gaile New MD on 09/27/2023 at 8:25:28 PM.    Final    NM Hepatobiliary Liver Func Result Date: 09/27/2023 CLINICAL DATA:  Right upper quadrant abdominal pain. EXAM: NUCLEAR MEDICINE HEPATOBILIARY IMAGING TECHNIQUE: Sequential images of the abdomen were obtained out to 60 minutes following intravenous administration of radiopharmaceutical. RADIOPHARMACEUTICALS:  7.21 mCi Tc-57m  Choletec  IV COMPARISON:  None Available. FINDINGS: Prompt uptake and biliary excretion of activity by the liver is seen. Gallbladder activity is visualized, consistent with patency of cystic duct. Biliary activity passes into small bowel, consistent with patent common bile duct. IMPRESSION: Normal nuclear medicine hepatobiliary scan. Electronically Signed   By: Suzen Dials M.D.   On: 09/27/2023 16:04   CT Angio Chest Pulmonary Embolism (PE) W or WO Contrast Result Date: 09/26/2023 CLINICAL DATA:  Shortness of breath. Acute bilateral lower extremity swelling, fever. EXAM: CT ANGIOGRAPHY CHEST WITH CONTRAST TECHNIQUE: Multidetector CT imaging of the chest was performed using the standard protocol during bolus administration of intravenous contrast. Multiplanar CT image reconstructions and MIPs were obtained to evaluate the vascular anatomy. RADIATION DOSE REDUCTION: This exam was performed according to the departmental dose-optimization program which includes automated exposure control, adjustment of the mA and/or kV according to patient size and/or use of iterative reconstruction technique. CONTRAST:  75mL OMNIPAQUE  IOHEXOL  350 MG/ML SOLN COMPARISON:  November 02, 2018. FINDINGS: Cardiovascular: Satisfactory opacification of the pulmonary arteries to the segmental level. No evidence of pulmonary embolism. Normal heart size. No pericardial effusion. Mediastinum/Nodes: No significant mediastinal adenopathy is noted. Mildly enlarged bilateral  axillary lymph nodes are noted which most likely are inflammatory or reactive in  etiology. Thyroid gland, trachea, and esophagus demonstrate no significant findings. Lungs/Pleura: No pneumothorax or pleural effusion is noted. Diffuse patchy and interstitial opacities are noted throughout both lungs concerning for edema or possibly multifocal pneumonia. Upper Abdomen: Hepatic steatosis. Musculoskeletal: No chest wall abnormality. No acute or significant osseous findings. Review of the MIP images confirms the above findings. IMPRESSION: 1. No definite evidence of pulmonary embolus. 2. Diffuse patchy and interstitial opacities are noted throughout both lungs concerning for edema or possibly multifocal pneumonia. 3. Hepatic steatosis. Electronically Signed   By: Lynwood Landy Raddle M.D.   On: 09/26/2023 14:01    Scheduled Meds:  Chlorhexidine  Gluconate Cloth  6 each Topical Daily   cloNIDine   0.1 mg Oral QID   Followed by   NOREEN ON 09/29/2023] cloNIDine   0.1 mg Oral BH-qamhs   Followed by   NOREEN ON 10/01/2023] cloNIDine   0.1 mg Oral QAC breakfast   influenza vac split trivalent PF  0.5 mL Intramuscular Tomorrow-1000   Continuous Infusions:  cefTRIAXone  (ROCEPHIN )  IV 2 g (09/27/23 1110)   metronidazole  500 mg (09/27/23 2354)   vancomycin  1,000 mg (09/28/23 0943)     LOS: 2 days   Ivonne Mustache, MD [facility] P9/08/2023, 10:52 AM

## 2023-09-29 ENCOUNTER — Inpatient Hospital Stay (HOSPITAL_COMMUNITY)

## 2023-09-29 DIAGNOSIS — R0603 Acute respiratory distress: Secondary | ICD-10-CM

## 2023-09-29 DIAGNOSIS — A419 Sepsis, unspecified organism: Secondary | ICD-10-CM

## 2023-09-29 DIAGNOSIS — J9601 Acute respiratory failure with hypoxia: Principal | ICD-10-CM

## 2023-09-29 DIAGNOSIS — R7989 Other specified abnormal findings of blood chemistry: Secondary | ICD-10-CM | POA: Diagnosis not present

## 2023-09-29 LAB — CBC
HCT: 26.5 % — ABNORMAL LOW (ref 36.0–46.0)
HCT: 27.7 % — ABNORMAL LOW (ref 36.0–46.0)
Hemoglobin: 8.3 g/dL — ABNORMAL LOW (ref 12.0–15.0)
Hemoglobin: 8.8 g/dL — ABNORMAL LOW (ref 12.0–15.0)
MCH: 25.2 pg — ABNORMAL LOW (ref 26.0–34.0)
MCH: 25.7 pg — ABNORMAL LOW (ref 26.0–34.0)
MCHC: 31.3 g/dL (ref 30.0–36.0)
MCHC: 31.8 g/dL (ref 30.0–36.0)
MCV: 80.5 fL (ref 80.0–100.0)
MCV: 80.8 fL (ref 80.0–100.0)
Platelets: 193 K/uL (ref 150–400)
Platelets: 216 K/uL (ref 150–400)
RBC: 3.29 MIL/uL — ABNORMAL LOW (ref 3.87–5.11)
RBC: 3.43 MIL/uL — ABNORMAL LOW (ref 3.87–5.11)
RDW: 20.1 % — ABNORMAL HIGH (ref 11.5–15.5)
RDW: 20.1 % — ABNORMAL HIGH (ref 11.5–15.5)
WBC: 7.8 K/uL (ref 4.0–10.5)
WBC: 9.7 K/uL (ref 4.0–10.5)
nRBC: 0 % (ref 0.0–0.2)
nRBC: 0.2 % (ref 0.0–0.2)

## 2023-09-29 LAB — BASIC METABOLIC PANEL WITH GFR
Anion gap: 10 (ref 5–15)
BUN: 15 mg/dL (ref 6–20)
CO2: 21 mmol/L — ABNORMAL LOW (ref 22–32)
Calcium: 8 mg/dL — ABNORMAL LOW (ref 8.9–10.3)
Chloride: 101 mmol/L (ref 98–111)
Creatinine, Ser: 0.68 mg/dL (ref 0.44–1.00)
GFR, Estimated: >60 mL/min (ref >60)
Glucose, Bld: 107 mg/dL — ABNORMAL HIGH (ref 70–99)
Potassium: 4.1 mmol/L (ref 3.5–5.1)
Sodium: 133 mmol/L — ABNORMAL LOW (ref 135–145)

## 2023-09-29 LAB — COMPREHENSIVE METABOLIC PANEL WITH GFR
ALT: 42 U/L (ref 0–44)
AST: 124 U/L — ABNORMAL HIGH (ref 15–41)
Albumin: 1.9 g/dL — ABNORMAL LOW (ref 3.5–5.0)
Alkaline Phosphatase: 1020 U/L — ABNORMAL HIGH (ref 38–126)
Anion gap: 9 (ref 5–15)
BUN: 16 mg/dL (ref 6–20)
CO2: 23 mmol/L (ref 22–32)
Calcium: 7.8 mg/dL — ABNORMAL LOW (ref 8.9–10.3)
Chloride: 101 mmol/L (ref 98–111)
Creatinine, Ser: 0.75 mg/dL (ref 0.44–1.00)
GFR, Estimated: >60 mL/min (ref >60)
Glucose, Bld: 120 mg/dL — ABNORMAL HIGH (ref 70–99)
Potassium: 4.1 mmol/L (ref 3.5–5.1)
Sodium: 133 mmol/L — ABNORMAL LOW (ref 135–145)
Total Bilirubin: 4.8 mg/dL — ABNORMAL HIGH (ref 0.0–1.2)
Total Protein: 6.6 g/dL (ref 6.5–8.1)

## 2023-09-29 LAB — MAGNESIUM: Magnesium: 2.4 mg/dL (ref 1.7–2.4)

## 2023-09-29 LAB — BLOOD GAS, ARTERIAL
Acid-Base Excess: 1.2 mmol/L (ref 0.0–2.0)
Bicarbonate: 24.7 mmol/L (ref 20.0–28.0)
O2 Saturation: 94 %
Patient temperature: 36.8
pCO2 arterial: 34 mmHg (ref 32–48)
pH, Arterial: 7.47 — ABNORMAL HIGH (ref 7.35–7.45)
pO2, Arterial: 62 mmHg — ABNORMAL LOW (ref 83–108)

## 2023-09-29 LAB — HIV-1 RNA QUANT-NO REFLEX-BLD
HIV 1 RNA Quant: <20 {copies}/mL
LOG10 HIV-1 RNA: UNDETERMINED {Log_copies}/mL

## 2023-09-29 LAB — LACTIC ACID, PLASMA: Lactic Acid, Venous: 1.1 mmol/L (ref 0.5–1.9)

## 2023-09-29 LAB — RPR: RPR Ser Ql: NONREACTIVE

## 2023-09-29 LAB — PHOSPHORUS: Phosphorus: 2.7 mg/dL (ref 2.5–4.6)

## 2023-09-29 LAB — CYTOMEGALOVIRUS DNA, QUANTITATIVE REAL-TIME PCR, PLASMA
CMV DNA Quant: NEGATIVE [IU]/mL
Log10 CMV Qn DNA Pl: UNDETERMINED {Log_IU}/mL

## 2023-09-29 LAB — EPSTEIN BARR VRS(EBV DNA BY PCR): EBV DNA QN by PCR: POSITIVE [IU]/mL

## 2023-09-29 LAB — TSH: TSH: 0.893 u[IU]/mL (ref 0.350–4.500)

## 2023-09-29 MED ORDER — LORAZEPAM 1 MG PO TABS
1.0000 mg | ORAL_TABLET | Freq: Four times a day (QID) | ORAL | Status: DC | PRN
  Administered 2023-09-29 – 2023-10-06 (×21): 1 mg via ORAL
  Filled 2023-09-29 (×22): qty 1

## 2023-09-29 MED ORDER — METOPROLOL TARTRATE 25 MG PO TABS
25.0000 mg | ORAL_TABLET | Freq: Two times a day (BID) | ORAL | Status: DC
Start: 2023-09-29 — End: 2023-09-30
  Administered 2023-09-29 (×2): 25 mg via ORAL
  Filled 2023-09-29 (×2): qty 1

## 2023-09-29 MED ORDER — OXYMETAZOLINE HCL 0.05 % NA SOLN
1.0000 | Freq: Two times a day (BID) | NASAL | Status: AC | PRN
  Administered 2023-09-29 – 2023-10-02 (×3): 1 via NASAL
  Filled 2023-09-29: qty 15

## 2023-09-29 MED ORDER — DEXMEDETOMIDINE HCL IN NACL 400 MCG/100ML IV SOLN
0.0000 ug/kg/h | INTRAVENOUS | Status: DC
  Administered 2023-09-29: 0.7 ug/kg/h via INTRAVENOUS
  Administered 2023-09-29: 0.4 ug/kg/h via INTRAVENOUS
  Administered 2023-09-30 (×2): 1.1 ug/kg/h via INTRAVENOUS
  Administered 2023-09-30: 1 ug/kg/h via INTRAVENOUS
  Administered 2023-09-30: 0.9 ug/kg/h via INTRAVENOUS
  Administered 2023-09-30 (×2): 1.1 ug/kg/h via INTRAVENOUS
  Administered 2023-10-01: 0.7 ug/kg/h via INTRAVENOUS
  Administered 2023-10-01 (×3): 1.1 ug/kg/h via INTRAVENOUS
  Filled 2023-09-29 (×12): qty 100

## 2023-09-29 MED ORDER — ALPRAZOLAM 0.5 MG PO TABS
0.5000 mg | ORAL_TABLET | Freq: Three times a day (TID) | ORAL | Status: DC | PRN
  Administered 2023-09-29: 0.5 mg via ORAL
  Filled 2023-09-29: qty 1

## 2023-09-29 NOTE — Plan of Care (Signed)
  Problem: Education: Goal: Knowledge of General Education information will improve Description: Including pain rating scale, medication(s)/side effects and non-pharmacologic comfort measures Outcome: Progressing   Problem: Clinical Measurements: Goal: Respiratory complications will improve Outcome: Progressing Goal: Cardiovascular complication will be avoided Outcome: Progressing   Problem: Activity: Goal: Risk for activity intolerance will decrease Outcome: Progressing   Problem: Nutrition: Goal: Adequate nutrition will be maintained Outcome: Progressing   Problem: Elimination: Goal: Will not experience complications related to bowel motility Outcome: Progressing Goal: Will not experience complications related to urinary retention Outcome: Progressing   Problem: Pain Managment: Goal: General experience of comfort will improve and/or be controlled Outcome: Not Progressing   Problem: Clinical Measurements: Goal: Ability to avoid or minimize complications of infection will improve Outcome: Not Progressing

## 2023-09-29 NOTE — Progress Notes (Addendum)
 PROGRESS NOTE  [name] [name]  FMW:[id number] DOB: 1980/04/13 DOA: 09/25/2023 PCP: Pcp, No   Brief Narrative: Patient is a 43 year old female with history of anxiety, depression, chronic back pain, cocaine abuse, alcohol  abuse, opiate use disorder, septic emboli, spinal epidural abscess who presented to the emergency department with complaint of fever, lower extremity swelling, right upper quadrant pain.  On presentation she was hypertensive, tachycardic but afebrile.  CT/x-ray of left lower extremity showed nonspecific edema which could be from cellulitis or postoperative changes due to her history of necrotizing fasciitis.  CT abdomen/pelvis showed findings suspicious for acute cholecystitis, mild dilation of CBD, no cholelithiasis or gallbladder wall edema.  CT imaging of the chest showed diffuse patchy bilateral interstitial opacities consistent with multifocal pneumonia.  Lab work showed elevated liver enzymes, ALP.  Ultrasound of the right upper quadrant showed hepatic steatosis, splenomegaly.  Suspected sepsis so started on broad antibiotics.  Was transferred to  stepdown  because of severe sinus tachycardia, withdrawal symptoms.  Clinically improving now.  Assessment & Plan:  Principal Problem:   Elevated LFTs Active Problems:   MDD (major depressive disorder), recurrent severe, without psychosis (HCC)   RUQ pain   Substance abuse (HCC)   Cellulitis of both lower extremities   Normocytic anemia   Protein-calorie malnutrition, severe (HCC)   Hyponatremia   Thrombocytopenia (HCC)  Suspected sepsis/cellulitis of right lower extremity: Presented with fever, tachycardia.  No leukocytosis.  Has erythema and edema of right lower extremity.  Likely cellulitis.  She had surgery for necrotizing fasciitis of left lower extremity in 2021.  She still has some sutures there .Dr Harden had operated on her.  Will follow-up with Dr. Harden if we can remove the staples after she stabilizes Imaging showed  bilateral possible multifocal pneumonia/pulm edema.  Continue current antibiotics for now.  Follow-up cultures.  One of the blood culture set showed staph species, Staphylococcus epidermidis, contamination suspected but true infection not excluded because of history of drug abuse.  Repeat culture sent urine:NGTD.  ID consulted and folowing.  Continue current antibiotics.  Urine cultures showed Serratia marcescens Remains in sinus tachycardia.  Afebrile today.  Acute hypoxic respiratory failure/multifocal pneumonia: Continue current antibiotics.  Continue bronchodilators as needed. Chest x-ray on 9/8   showed markedly increased alveolar and interstitial markings throughout bilateral lungs. Suspicious for  congestive heart failure/pulmonary edema, ARDS or multilobar pneumonia.  Continue current antibiotics.  Echo showed EF of 60 to 60%, normal left ventricular function, normal right ventricular function, no vegetation or valvular abnormalities.  ID does not recommend TEE.  Since oxygenation improved with IV Lasix , will continue that for now.  On 2 to 4 L of oxygen today.  Right upper quadrant pain/elevated LFTs: Presented with fever, right upper quadrant pain, tachycardia.  She complains of right upper quadrant pain radiating to her right thigh.  Lab work showed mild elevated liver enzymes, elevated ALP. CT abdomen/pelvis showed findings suspicious for acute cholecystitis, mild dilation of CBD, no cholelithiasis or gallbladder wall edema.  Ultrasound of the right upper quadrant showed hepatic steatosis, splenomegaly.  HIDA scan does not suggest cholecystitis We might need  to order MRCP.  Will defer to GI.  ALP, liver enzymes, bilirubin remain mildly elevated but improving.  HCV antibody reactive  History of substance abuse: Injects fentanyl .  Counseled for cessation.  She will need to be followed by Suboxone /methadone clinic on discharge.  Currently on withdrawal.  Continue pain medications in  the  interim  Normocytic anemia: Current hemoglobin stable.  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  ID consulting.  Pending HCVRNA, HIV RNA  Sinus tachycardia/hypertension: Remains in mild sinus tachycardia.  Likely from opioid withdrawal as well.  Also hypertensive.  Started metoprolol    Addendum  I was called because patient became obtunded, started hyperventilating and requiring 10 L of oxygen per minute.  Patient seen and examined at the bedside.  She was drowsy and lethargic.  Respiratory rate in the range of 40s. Requiring 10 L of oxygen per minute now.  Due to her uncontrolled withdrawal symptoms from opiate and now worsening respiratory status, we consulted PCCM.      DVT prophylaxis:SCDs Start: 09/26/23 9094     Code Status: Full Code  Family Communication: None at bedside  Patient status:Inpatient  Patient is from :home  Anticipated discharge to:not sure  Estimated DC date:not sure   Consultants: ID,GI  Procedures:None  Antimicrobials:  Anti-infectives (From admission, onward)    Start     Dose/Rate Route Frequency Ordered Stop   09/27/23 1200  vancomycin  (VANCOREADY) IVPB 1250 mg/250 mL  Status:  Discontinued        1,250 mg 166.7 mL/hr over 90 Minutes Intravenous Every 24 hours 09/26/23 1208 09/27/23 1058   09/27/23 1130  vancomycin  (VANCOCIN ) IVPB 1000 mg/200 mL premix  Status:  Discontinued        1,000 mg 200 mL/hr over 60 Minutes Intravenous 2 times daily 09/27/23 1058 09/28/23 1127   09/26/23 1215  vancomycin  (VANCOCIN ) IVPB 1000 mg/200 mL premix        1,000 mg 200 mL/hr over 60 Minutes Intravenous  Once 09/26/23 1208 09/26/23 1407   09/26/23 1200  cefTRIAXone  (ROCEPHIN ) 2 g in sodium chloride  0.9 % 100 mL IVPB        2 g 200 mL/hr over 30 Minutes Intravenous Every 24 hours 09/26/23 1122     09/26/23 1200  metroNIDAZOLE  (FLAGYL ) IVPB 500 mg        500  mg 100 mL/hr over 60 Minutes Intravenous Every 12 hours 09/26/23 1122     09/26/23 0000  vancomycin  (VANCOCIN ) IVPB 1000 mg/200 mL premix        1,000 mg 200 mL/hr over 60 Minutes Intravenous  Once 09/25/23 2359 09/26/23 0225   09/26/23 0000  ceFEPIme  (MAXIPIME ) 2 g in sodium chloride  0.9 % 100 mL IVPB        2 g 200 mL/hr over 30 Minutes Intravenous  Once 09/25/23 2359 09/26/23 0130       Subjective: Patient seen and examined at the bedside today.  Afebrile this morning.  Looks comfortable today.  Not on high flow oxygen anymore.  On 2 L oxygen during my evaluation.  Denies shortness of breath or cough.  Complains of generalized body ache.  Sinus tachycardia   Objective: Vitals:   09/29/23 0810 09/29/23 0900 09/29/23 1000 09/29/23 1001  BP: (!) 145/99 (!) 146/92 (!) 146/99 (!) 146/99  Pulse: (!) 117 (!) 114 (!) 118 (!) 117  Resp: 14 20 (!) 22 (!) 25  Temp:      TempSrc:      SpO2: 94% 90% 90% 92%  Weight:      Height:        Intake/Output Summary (Last 24 hours) at 09/29/2023 1104 Last data filed at 09/29/2023 0556 Gross per 24 hour  Intake 278.76 ml  Output 2675 ml  Net -2396.24 ml   [employer]  09/26/23 1441  Weight: 109.1 kg    Examination:    General exam: Overall comfortable, not in distress HEENT: PERRL Respiratory system:  no wheezes or crackles , mildly diminished sounds on bases Cardiovascular system: Sinus tachycardia Gastrointestinal system: Abdomen is nondistended, soft and nontender.  Good bowel sounds present Central nervous system: Alert and oriented Extremities: Trivial bilateral lower extremity edema, improving erythematous changes on the right lower extremity, old necrotizing fasciitis scar on the left shin with metallic sutures Skin: Scattered rash, track marks on both arms     Data Reviewed: I have personally reviewed following labs and imaging studies  CBC: Recent Labs  Lab 09/26/23 0000 09/26/23 1545 09/27/23 0711 09/28/23 0237  09/29/23 0316  WBC 4.9 5.3 6.1 6.4 7.8  NEUTROABS 3.1  --   --   --   --   HGB 8.8* 10.1* 8.8* 8.9* 8.3*  HCT 28.0* 33.2* 28.3* 28.8* 26.5*  MCV 81.2 82.8 81.1 81.4 80.5  PLT 145* 137* 157 176 193   Basic Metabolic Panel: Recent Labs  Lab 09/25/23 2251 09/26/23 0921 09/27/23 0711 09/28/23 0237 09/29/23 0316  NA 135 134* 133* 134* 133*  K 5.1 4.6 4.5 4.2 4.1  CL 105 106 104 105 101  CO2 21* 21* 20* 19* 23  GLUCOSE 93 86 98 103* 120*  BUN 23* 22* 18 15 16   CREATININE 0.83 0.84 0.79 0.66 0.75  CALCIUM  8.3* 7.6* 7.9* 7.7* 7.8*  MG  --  2.2  --   --   --   PHOS  --  3.0  --   --   --      Recent Results (from the past 240 hours)  Resp panel by RT-PCR (RSV, Flu A&B, Covid) Anterior Nasal Swab     Status: None   Collection Time: 09/25/23  9:54 PM   Specimen: Anterior Nasal Swab  Result Value Ref Range Status   SARS Coronavirus 2 by RT PCR NEGATIVE NEGATIVE Final    Comment: (NOTE) SARS-CoV-2 target nucleic acids are NOT DETECTED.  The SARS-CoV-2 RNA is generally detectable in upper respiratory specimens during the acute phase of infection. The lowest concentration of SARS-CoV-2 viral copies this assay can detect is 138 copies/mL. A negative result does not preclude SARS-Cov-2 infection and should not be used as the sole basis for treatment or other patient management decisions. A negative result may occur with  improper specimen collection/handling, submission of specimen other than nasopharyngeal swab, presence of viral mutation(s) within the areas targeted by this assay, and inadequate number of viral copies(<138 copies/mL). A negative result must be combined with clinical observations, patient history, and epidemiological information. The expected result is Negative.  Fact Sheet for Patients:  BloggerCourse.com  Fact Sheet for Healthcare Providers:  SeriousBroker.it  This test is no t yet approved or cleared by the  [place]  FDA and  has been authorized for detection and/or diagnosis of SARS-CoV-2 by FDA under an Emergency Use Authorization (EUA). This EUA will remain  in effect (meaning this test can be used) for the duration of the COVID-19 declaration under Section 564(b)(1) of the Act, 21 U.S.C.section 360bbb-3(b)(1), unless the authorization is terminated  or revoked sooner.       Influenza A by PCR NEGATIVE NEGATIVE Final   Influenza B by PCR NEGATIVE NEGATIVE Final    Comment: (NOTE) The Xpert Xpress SARS-CoV-2/FLU/RSV plus assay is intended as an aid in the diagnosis of influenza from Nasopharyngeal swab specimens and should not be used as  a sole basis for treatment. Nasal washings and aspirates are unacceptable for Xpert Xpress SARS-CoV-2/FLU/RSV testing.  Fact Sheet for Patients: BloggerCourse.com  Fact Sheet for Healthcare Providers: SeriousBroker.it  This test is not yet approved or cleared by the [place]  FDA and has been authorized for detection and/or diagnosis of SARS-CoV-2 by FDA under an Emergency Use Authorization (EUA). This EUA will remain in effect (meaning this test can be used) for the duration of the COVID-19 declaration under Section 564(b)(1) of the Act, 21 U.S.C. section 360bbb-3(b)(1), unless the authorization is terminated or revoked.     Resp Syncytial Virus by PCR NEGATIVE NEGATIVE Final    Comment: (NOTE) Fact Sheet for Patients: BloggerCourse.com  Fact Sheet for Healthcare Providers: SeriousBroker.it  This test is not yet approved or cleared by the [place]  FDA and has been authorized for detection and/or diagnosis of SARS-CoV-2 by FDA under an Emergency Use Authorization (EUA). This EUA will remain in effect (meaning this test can be used) for the duration of the COVID-19 declaration under Section 564(b)(1) of the Act, 21 U.S.C. section  360bbb-3(b)(1), unless the authorization is terminated or revoked.  Performed at [facility], [address], [place], [place] [address]   Culture, blood (Routine X 2) w Reflex to ID Panel     Status: None (Preliminary result)   Collection Time: 09/25/23  9:54 PM   Specimen: BLOOD  Result Value Ref Range Status   Specimen Description   Final    BLOOD BLOOD RIGHT ARM Performed at [facility], [address], [place], [place] [address]    Special Requests   Final    BOTTLES DRAWN AEROBIC AND ANAEROBIC Blood Culture results may not be optimal due to an inadequate volume of blood received in culture bottles Performed at [facility], [address] [place], [place] [address]    Culture   Final    NO GROWTH 3 DAYS Performed at [facility], [address], [place], [place] [address]    Report Status PENDING  Incomplete  Culture, blood (Routine X 2) w Reflex to ID Panel     Status: Abnormal   Collection Time: 09/25/23  9:54 PM   Specimen: BLOOD LEFT ARM  Result Value Ref Range Status   Specimen Description   Final    BLOOD LEFT ARM Performed at [facility], [address], [place], [place] [address]    Special Requests   Final    BOTTLES DRAWN AEROBIC AND ANAEROBIC Blood Culture adequate volume Performed at [facility], [address] [place], [place] [address]    Culture  Setup Time   Final    GRAM POSITIVE COCCI IN CLUSTERS IN BOTH AEROBIC AND ANAEROBIC BOTTLES CRITICAL RESULT CALLED TO, READ BACK BY AND VERIFIED WITH: PHARMD E JACKSON 09/26/2023 @ 2338 BY AB    Culture (A)  Final    STAPHYLOCOCCUS EPIDERMIDIS THE SIGNIFICANCE OF ISOLATING THIS ORGANISM FROM A SINGLE SET OF BLOOD CULTURES WHEN MULTIPLE SETS ARE DRAWN IS UNCERTAIN. PLEASE NOTIFY THE MICROBIOLOGY DEPARTMENT WITHIN ONE WEEK IF SPECIATION AND SENSITIVITIES ARE REQUIRED. Performed at [facility], [address],  [place], [place] [address]    Report Status 09/28/2023 FINAL  Final  Urine Culture     Status: Abnormal   Collection Time: 09/25/23  9:54 PM   Specimen: Urine, Random  Result Value Ref Range Status   Specimen Description   Final  URINE, RANDOM Performed at [facility], [address] [place], [place] [address]    Special Requests   Final    NONE Reflexed from [phone] Performed at [facility], [address], [place], [place] [address]    Culture >=100,000 COLONIES/mL SERRATIA MARCESCENS (A)  Final   Report Status 09/28/2023 FINAL  Final   Organism ID, Bacteria SERRATIA MARCESCENS (A)  Final      Susceptibility   Serratia marcescens - MIC*    CEFEPIME  <=0.12 SENSITIVE Sensitive     ERTAPENEM <=0.12 SENSITIVE Sensitive     CEFTRIAXONE  <=0.25 SENSITIVE Sensitive     CIPROFLOXACIN  <=0.06 SENSITIVE Sensitive     GENTAMICIN <=1 SENSITIVE Sensitive     NITROFURANTOIN  128 RESISTANT Resistant     TRIMETH/SULFA <=20 SENSITIVE Sensitive     MEROPENEM <=0.25 SENSITIVE Sensitive     * >=100,000 COLONIES/mL SERRATIA MARCESCENS  Blood Culture ID Panel (Reflexed)     Status: Abnormal   Collection Time: 09/25/23  9:54 PM  Result Value Ref Range Status   Enterococcus faecalis NOT DETECTED NOT DETECTED Final   Enterococcus Faecium NOT DETECTED NOT DETECTED Final   Listeria monocytogenes NOT DETECTED NOT DETECTED Final   Staphylococcus species DETECTED (A) NOT DETECTED Final    Comment: CRITICAL RESULT CALLED TO, READ BACK BY AND VERIFIED WITH: PHARMD E JACKSON 09/26/2023 @ 2338 BY AB    Staphylococcus aureus (BCID) NOT DETECTED NOT DETECTED Final   Staphylococcus epidermidis DETECTED (A) NOT DETECTED Final    Comment: CRITICAL RESULT CALLED TO, READ BACK BY AND VERIFIED WITH: PHARMD E JACKSON 09/26/2023 @ 2338 BY AB    Staphylococcus lugdunensis NOT DETECTED NOT DETECTED Final   Streptococcus species NOT DETECTED NOT DETECTED Final   Streptococcus agalactiae  NOT DETECTED NOT DETECTED Final   Streptococcus pneumoniae NOT DETECTED NOT DETECTED Final   Streptococcus pyogenes NOT DETECTED NOT DETECTED Final   A.calcoaceticus-baumannii NOT DETECTED NOT DETECTED Final   Bacteroides fragilis NOT DETECTED NOT DETECTED Final   Enterobacterales NOT DETECTED NOT DETECTED Final   Enterobacter cloacae complex NOT DETECTED NOT DETECTED Final   Escherichia coli NOT DETECTED NOT DETECTED Final   Klebsiella aerogenes NOT DETECTED NOT DETECTED Final   Klebsiella oxytoca NOT DETECTED NOT DETECTED Final   Klebsiella pneumoniae NOT DETECTED NOT DETECTED Final   Proteus species NOT DETECTED NOT DETECTED Final   Salmonella species NOT DETECTED NOT DETECTED Final   Serratia marcescens NOT DETECTED NOT DETECTED Final   Haemophilus influenzae NOT DETECTED NOT DETECTED Final   Neisseria meningitidis NOT DETECTED NOT DETECTED Final   Pseudomonas aeruginosa NOT DETECTED NOT DETECTED Final   Stenotrophomonas maltophilia NOT DETECTED NOT DETECTED Final   Candida albicans NOT DETECTED NOT DETECTED Final   Candida auris NOT DETECTED NOT DETECTED Final   Candida glabrata NOT DETECTED NOT DETECTED Final   Candida krusei NOT DETECTED NOT DETECTED Final   Candida parapsilosis NOT DETECTED NOT DETECTED Final   Candida tropicalis NOT DETECTED NOT DETECTED Final   Cryptococcus neoformans/gattii NOT DETECTED NOT DETECTED Final   Methicillin resistance mecA/C NOT DETECTED NOT DETECTED Final    Comment: Performed at [facility], [address] [place], [place] [address]  Culture, blood (Routine X 2) w Reflex to ID Panel     Status: None (Preliminary result)   Collection Time: 09/27/23  9:56 AM   Specimen: BLOOD RIGHT ARM  Result Value Ref Range Status   Specimen Description   Final  BLOOD RIGHT ARM Performed at [facility], [address] [place], [place] [address]    Special Requests   Final    BOTTLES DRAWN AEROBIC AND ANAEROBIC Blood Culture results may  not be optimal due to an inadequate volume of blood received in culture bottles Performed at [facility], [address], [place], [place] [address]    Culture   Final    NO GROWTH 2 DAYS Performed at [facility], [address] [place], [place] [address]    Report Status PENDING  Incomplete  Culture, blood (Routine X 2) w Reflex to ID Panel     Status: None (Preliminary result)   Collection Time: 09/27/23  9:56 AM   Specimen: BLOOD RIGHT HAND  Result Value Ref Range Status   Specimen Description   Final    BLOOD RIGHT HAND Performed at [facility], [address], [place], [place] [address]    Special Requests   Final    BOTTLES DRAWN AEROBIC AND ANAEROBIC Blood Culture results may not be optimal due to an inadequate volume of blood received in culture bottles Performed at [facility] [address], [place], [place] [address]    Culture   Final    NO GROWTH 2 DAYS Performed at [facility], [address], [place], [place] [address]    Report Status PENDING  Incomplete  MRSA Next Gen by PCR, Nasal     Status: None   Collection Time: 09/28/23  9:23 AM   Specimen: Nasal Mucosa; Nasal Swab  Result Value Ref Range Status   MRSA by PCR Next Gen NOT DETECTED NOT DETECTED Final    Comment: (NOTE) The GeneXpert MRSA Assay (FDA approved for NASAL specimens only), is one component of a comprehensive MRSA colonization surveillance program. It is not intended to diagnose MRSA infection nor to guide or monitor treatment for MRSA infections. Test performance is not FDA approved in patients less than 62 years old. Performed at [facility], [address], [place], [place] [address]      Radiology Studies: ECHOCARDIOGRAM COMPLETE Result Date: 09/28/2023    ECHOCARDIOGRAM REPORT   Patient Name:   [name] [name] Date of Exam: 09/28/2023 Medical Rec #:  [id number]     Height:       65.0 in Accession #:     [id number]    Weight:       240.5 lb Date of Birth:  Jun 17, 1980     BSA:          2.139 m Patient Age:    43 years      BP:           127/87 mmHg Patient Gender: F             HR:           112 bpm. Exam Location:  Inpatient Procedure: 2D Echo, Cardiac Doppler and Color Doppler (Both Spectral and Color            Flow Doppler were utilized during procedure). Indications:    Endocarditis  History:        Patient has prior history of Echocardiogram examinations, most                 recent 10/25/2018. Signs/Symptoms:Bacteremia; Risk Factors:IVDU,                 polysubstance abuse.  Sonographer:    Brittana  Dela RDCS Referring Phys: [id number] DAVID P FITZGERALD IMPRESSIONS  1. Left ventricular ejection fraction, by estimation, is 60 to 65%. The left ventricle has normal function. The left ventricle has no regional wall motion abnormalities. Left ventricular diastolic parameters were normal.  2. Right ventricular systolic function is normal. The right ventricular size is normal. There is mildly elevated pulmonary artery systolic pressure.  3. Left atrial size was mildly dilated.  4. The mitral valve is abnormal. Trivial mitral valve regurgitation. No evidence of mitral stenosis.  5. The aortic valve is tricuspid. There is mild calcification of the aortic valve. Aortic valve regurgitation is not visualized. Aortic valve sclerosis is present, with no evidence of aortic valve stenosis.  6. The inferior vena cava is normal in size with greater than 50% respiratory variability, suggesting right atrial pressure of 3 mmHg. FINDINGS  Left Ventricle: Left ventricular ejection fraction, by estimation, is 60 to 65%. The left ventricle has normal function. The left ventricle has no regional wall motion abnormalities. Strain was performed and the global longitudinal strain is indeterminate. The left ventricular internal cavity size was normal in size. There is no left ventricular hypertrophy. Left ventricular diastolic parameters were  normal. Right Ventricle: The right ventricular size is normal. No increase in right ventricular wall thickness. Right ventricular systolic function is normal. There is mildly elevated pulmonary artery systolic pressure. The tricuspid regurgitant velocity is 2.99  m/s, and with an assumed right atrial pressure of 3 mmHg, the estimated right ventricular systolic pressure is 38.8 mmHg. Left Atrium: Left atrial size was mildly dilated. Right Atrium: Right atrial size was normal in size. Pericardium: There is no evidence of pericardial effusion. Mitral Valve: The mitral valve is abnormal. There is mild thickening of the mitral valve leaflet(s). Mild mitral annular calcification. Trivial mitral valve regurgitation. No evidence of mitral valve stenosis. There is no evidence of mitral valve vegetation. Tricuspid Valve: The tricuspid valve is normal in structure. Tricuspid valve regurgitation is mild . No evidence of tricuspid stenosis. There is no evidence of tricuspid valve vegetation. Aortic Valve: The aortic valve is tricuspid. There is mild calcification of the aortic valve. Aortic valve regurgitation is not visualized. Aortic valve sclerosis is present, with no evidence of aortic valve stenosis. There is no evidence of aortic valve  vegetation. Pulmonic Valve: The pulmonic valve was not well visualized. Pulmonic valve regurgitation is trivial. No evidence of pulmonic stenosis. There is no evidence of pulmonic valve vegetation. Aorta: The aortic root is normal in size and structure. Venous: The inferior vena cava is normal in size with greater than 50% respiratory variability, suggesting right atrial pressure of 3 mmHg. IAS/Shunts: The interatrial septum was not well visualized. Additional Comments: 3D was performed not requiring image post processing on an independent workstation and was indeterminate.  LEFT VENTRICLE PLAX 2D LVIDd:         4.20 cm   Diastology LVIDs:         2.60 cm   LV e' medial: 4.57 cm/s LV PW:          0.90 cm LV IVS:        0.90 cm LVOT diam:     1.90 cm LV SV:         52 LV SV Index:   25 LVOT Area:     2.84 cm  RIGHT VENTRICLE             IVC RV S prime:     14.20 cm/s  IVC  diam: 1.30 cm TAPSE (M-mode): 2.9 cm LEFT ATRIUM             Index LA diam:        3.80 cm 1.78 cm/m LA Vol (A2C):   52.7 ml 24.63 ml/m LA Vol (A4C):   60.3 ml 28.18 ml/m LA Biplane Vol: 59.0 ml 27.58 ml/m  AORTIC VALVE LVOT Vmax:   122.00 cm/s LVOT Vmean:  83.900 cm/s LVOT VTI:    0.185 m  AORTA Ao Root diam: 2.70 cm Ao Asc diam:  2.70 cm TRICUSPID VALVE TR Peak grad:   35.8 mmHg TR Vmax:        299.00 cm/s  SHUNTS Systemic VTI:  0.18 m Systemic Diam: 1.90 cm Maude Emmer MD Electronically signed by Maude Emmer MD Signature Date/Time: 09/28/2023/2:23:41 PM    Final    DG CHEST PORT 1 VIEW Result Date: 09/28/2023 CLINICAL DATA:  Shortness of breath. EXAM: PORTABLE CHEST 1 VIEW COMPARISON:  09/25/2023. FINDINGS: Low lung volume. There are markedly increased alveolar and interstitial markings throughout bilateral lungs. Findings are nonspecific and differential diagnosis includes congestive heart failure/pulmonary edema, ARDS or multilobar pneumonia. Correlate clinically. Bilateral costophrenic angles are clear. Stable cardio-mediastinal silhouette. No acute osseous abnormalities. The soft tissues are within normal limits. IMPRESSION: *Markedly increased alveolar and interstitial markings throughout bilateral lungs. Findings are nonspecific and differential diagnosis includes congestive heart failure/pulmonary edema, ARDS or multilobar pneumonia. Electronically Signed   By: Ree Molt M.D.   On: 09/28/2023 08:26   NM Hepatobiliary Liver Func Result Date: 09/27/2023 CLINICAL DATA:  Right upper quadrant abdominal pain. EXAM: NUCLEAR MEDICINE HEPATOBILIARY IMAGING TECHNIQUE: Sequential images of the abdomen were obtained out to 60 minutes following intravenous administration of radiopharmaceutical. RADIOPHARMACEUTICALS:  7.21 mCi  Tc-20m  Choletec  IV COMPARISON:  None Available. FINDINGS: Prompt uptake and biliary excretion of activity by the liver is seen. Gallbladder activity is visualized, consistent with patency of cystic duct. Biliary activity passes into small bowel, consistent with patent common bile duct. IMPRESSION: Normal nuclear medicine hepatobiliary scan. Electronically Signed   By: Suzen Dials M.D.   On: 09/27/2023 16:04    Scheduled Meds:  Chlorhexidine  Gluconate Cloth  6 each Topical Daily   cloNIDine   0.1 mg Oral BH-qamhs   Followed by   NOREEN ON 10/01/2023] cloNIDine   0.1 mg Oral QAC breakfast   furosemide   40 mg Intravenous Q12H   influenza vac split trivalent PF  0.5 mL Intramuscular Tomorrow-1000   metoprolol  tartrate  25 mg Oral BID   Continuous Infusions:  cefTRIAXone  (ROCEPHIN )  IV Stopped (09/28/23 1247)   metronidazole  Stopped (09/28/23 2354)     LOS: 3 days   Ivonne Mustache, MD [facility] P9/09/2023, 11:04 AM

## 2023-09-29 NOTE — Progress Notes (Addendum)
 Attending note: I have seen and examined the patient. History, labs and imaging reviewed.  43 year old with history of anxiety, depression, chronic back pain, polysubstance abuse with history of spinal epidural abscess, cellulitis, septic emboli presenting with fever, lower extremity swelling, right upper quadrant pain, multifocal pneumonia.  CT lower extremity without evidence of osteomyelitis or necrotizing fasciitis.  Being treated with IV antibiotics for Serratia UTI, Staph epidermidis in blood culture..  Today she became increasingly tachypneic, agitated.  PCCM consulted for help with management  Blood pressure (!) 155/93, pulse (!) 109, temperature 98.5 F (36.9 C), temperature source Oral, resp. rate (!) 44, height 5' 5 (1.651 m), weight 109.1 kg, last menstrual period 08/10/2011, SpO2 100%. Gen:      No acute distress, chronically ill looking. HEENT:  EOMI, sclera anicteric Neck:     No masses; no thyromegaly Lungs:    Clear to auscultation bilaterally; normal respiratory effort CV:         Regular rate and rhythm; no murmurs Abd:      + bowel sounds; soft, non-tender; no palpable masses, no distension Ext:    Lower extremity erythema, mild swelling Neuro: alert and oriented x 3 Psych: normal mood and affect   Labs/Imaging personally reviewed, significant for Sodium 133, BUN/creatinine 16/0.75 Alk phos 1020, AST 124, total bilirubin 4.8 Hemoglobin 8.3  Assessment/plan: Sepsis present on admission Presumed source is right lower extremity cellulitis Coag negative staph is presumed contaminant.  Also has Serratia growing in the urine Appreciate input from ID.  Continue ceftriaxone , metronidazole   Acute hypoxic respiratory failure Chest x-ray, ABG.  Check lactic acid Continue high flow nasal cannula Lasix  for diuresis Respiratory virus panel  Agitation, opiate withdrawal Start Precedex .  Continue Ativan  as needed  Elevated LFTs Baseline cirrhosis in the setting of hepatitis  C Normal HIDA scan Continue supportive care  The patient is critically ill with multiple organ systems failure and requires high complexity decision making for assessment and support, frequent evaluation and titration of therapies, application of advanced monitoring technologies and extensive interpretation of multiple databases.  Critical care time - 20 mins. This represents my time independent of the NPs time taking care of the pt.  [name] MD [facility] 09/29/2023, 6:28 PM

## 2023-09-29 NOTE — Plan of Care (Signed)
 Id brief note   From id standpoint no change in clinical assessment Await hiv testing (hiv rna negative and likely this is a false positive test) Finish cellulitis treatment CoNS bsi likely contaminant and on abx already for cellulitis Lft up likely cirrhosis vs biliary obstruction  Volume mangement/hypoxic distress improving per primary team   -gi following for lft elevation; from my standpoint low suspicion for biliary infection -once GI completes w/u (mrcp planned) would transition all abx to cefadroxil  for cellulitis (if mrcp negative) -hiv confirmation pending but likely a false positive test (rna negative) -will f/u tomorrow -discussed with primary team

## 2023-09-29 NOTE — Progress Notes (Signed)
 Subjective: Complaining about skin pain in the right side of the abdomen and up to the right mid thigh.  Objective: Vital signs in last 24 hours: Temp:  [98.1 F (36.7 C)-99.7 F (37.6 C)] 98.5 F (36.9 C) (09/09 1200) Pulse Rate:  [95-120] 97 (09/09 1400) Resp:  [7-39] 23 (09/09 1400) BP: (127-149)/(86-102) 142/88 (09/09 1400) SpO2:  [88 %-98 %] 95 % (09/09 1400) Last BM Date : 09/29/23  Intake/Output from previous day: 09/08 0701 - 09/09 0700 In: 478.8 [IV Piggyback:478.8] Out: 3575 [Urine:3575] Intake/Output this shift: Total I/O In: 200 [IV Piggyback:200] Out: -   General appearance: moderate distress GI: soft, non-tender; bowel sounds normal; no masses,  no organomegaly  Lab Results: Recent Labs    09/27/23 0711 09/28/23 0237 09/29/23 0316  WBC 6.1 6.4 7.8  HGB 8.8* 8.9* 8.3*  HCT 28.3* 28.8* 26.5*  PLT 157 176 193   BMET Recent Labs    09/27/23 0711 09/28/23 0237 09/29/23 0316  NA 133* 134* 133*  K 4.5 4.2 4.1  CL 104 105 101  CO2 20* 19* 23  GLUCOSE 98 103* 120*  BUN 18 15 16   CREATININE 0.79 0.66 0.75  CALCIUM  7.9* 7.7* 7.8*   LFT Recent Labs    09/28/23 0237 09/29/23 0316  PROT 6.4* 6.6  ALBUMIN  1.9* 1.9*  AST 166* 124*  ALT 52* 42  ALKPHOS 1,118* 1,020*  BILITOT 3.9* 4.8*  BILIDIR 2.7*  --   IBILI 1.2*  --    PT/INR No results for input(s): LABPROT, INR in the last 72 hours. Hepatitis Panel Recent Labs    09/27/23 0711  HEPBSAG NON REACTIVE  HCVAB Reactive*  HEPAIGM NON REACTIVE  HEPBIGM NON REACTIVE   C-Diff No results for input(s): CDIFFTOX in the last 72 hours. Fecal Lactopherrin No results for input(s): FECLLACTOFRN in the last 72 hours.  Studies/Results: ECHOCARDIOGRAM COMPLETE Result Date: 09/28/2023    ECHOCARDIOGRAM REPORT   Patient Name:   [name] [name] Date of Exam: 09/28/2023 Medical Rec #:  [id number]     Height:       65.0 in Accession #:    [id number]    Weight:       240.5 lb Date of Birth:  24-Oct-1980      BSA:          2.139 m Patient Age:    43 years      BP:           127/87 mmHg Patient Gender: F             HR:           112 bpm. Exam Location:  Inpatient Procedure: 2D Echo, Cardiac Doppler and Color Doppler (Both Spectral and Color            Flow Doppler were utilized during procedure). Indications:    Endocarditis  History:        Patient has prior history of Echocardiogram examinations, most                 recent 10/25/2018. Signs/Symptoms:Bacteremia; Risk Factors:IVDU,                 polysubstance abuse.  Sonographer:    Koleen Popper RDCS Referring Phys: [phone] DAVID P FITZGERALD IMPRESSIONS  1. Left ventricular ejection fraction, by estimation, is 60 to 65%. The left ventricle has normal function. The left ventricle has no regional wall motion abnormalities. Left ventricular diastolic parameters were normal.  2. Right ventricular systolic  function is normal. The right ventricular size is normal. There is mildly elevated pulmonary artery systolic pressure.  3. Left atrial size was mildly dilated.  4. The mitral valve is abnormal. Trivial mitral valve regurgitation. No evidence of mitral stenosis.  5. The aortic valve is tricuspid. There is mild calcification of the aortic valve. Aortic valve regurgitation is not visualized. Aortic valve sclerosis is present, with no evidence of aortic valve stenosis.  6. The inferior vena cava is normal in size with greater than 50% respiratory variability, suggesting right atrial pressure of 3 mmHg. FINDINGS  Left Ventricle: Left ventricular ejection fraction, by estimation, is 60 to 65%. The left ventricle has normal function. The left ventricle has no regional wall motion abnormalities. Strain was performed and the global longitudinal strain is indeterminate. The left ventricular internal cavity size was normal in size. There is no left ventricular hypertrophy. Left ventricular diastolic parameters were normal. Right Ventricle: The right ventricular size is normal. No  increase in right ventricular wall thickness. Right ventricular systolic function is normal. There is mildly elevated pulmonary artery systolic pressure. The tricuspid regurgitant velocity is 2.99  m/s, and with an assumed right atrial pressure of 3 mmHg, the estimated right ventricular systolic pressure is 38.8 mmHg. Left Atrium: Left atrial size was mildly dilated. Right Atrium: Right atrial size was normal in size. Pericardium: There is no evidence of pericardial effusion. Mitral Valve: The mitral valve is abnormal. There is mild thickening of the mitral valve leaflet(s). Mild mitral annular calcification. Trivial mitral valve regurgitation. No evidence of mitral valve stenosis. There is no evidence of mitral valve vegetation. Tricuspid Valve: The tricuspid valve is normal in structure. Tricuspid valve regurgitation is mild . No evidence of tricuspid stenosis. There is no evidence of tricuspid valve vegetation. Aortic Valve: The aortic valve is tricuspid. There is mild calcification of the aortic valve. Aortic valve regurgitation is not visualized. Aortic valve sclerosis is present, with no evidence of aortic valve stenosis. There is no evidence of aortic valve  vegetation. Pulmonic Valve: The pulmonic valve was not well visualized. Pulmonic valve regurgitation is trivial. No evidence of pulmonic stenosis. There is no evidence of pulmonic valve vegetation. Aorta: The aortic root is normal in size and structure. Venous: The inferior vena cava is normal in size with greater than 50% respiratory variability, suggesting right atrial pressure of 3 mmHg. IAS/Shunts: The interatrial septum was not well visualized. Additional Comments: 3D was performed not requiring image post processing on an independent workstation and was indeterminate.  LEFT VENTRICLE PLAX 2D LVIDd:         4.20 cm   Diastology LVIDs:         2.60 cm   LV e' medial: 4.57 cm/s LV PW:         0.90 cm LV IVS:        0.90 cm LVOT diam:     1.90 cm LV SV:          52 LV SV Index:   25 LVOT Area:     2.84 cm  RIGHT VENTRICLE             IVC RV S prime:     14.20 cm/s  IVC diam: 1.30 cm TAPSE (M-mode): 2.9 cm LEFT ATRIUM             Index LA diam:        3.80 cm 1.78 cm/m LA Vol (A2C):   52.7 ml 24.63 ml/m LA Vol (A4C):  60.3 ml 28.18 ml/m LA Biplane Vol: 59.0 ml 27.58 ml/m  AORTIC VALVE LVOT Vmax:   122.00 cm/s LVOT Vmean:  83.900 cm/s LVOT VTI:    0.185 m  AORTA Ao Root diam: 2.70 cm Ao Asc diam:  2.70 cm TRICUSPID VALVE TR Peak grad:   35.8 mmHg TR Vmax:        299.00 cm/s  SHUNTS Systemic VTI:  0.18 m Systemic Diam: 1.90 cm Maude Emmer MD Electronically signed by Maude Emmer MD Signature Date/Time: 09/28/2023/2:23:41 PM    Final    DG CHEST PORT 1 VIEW Result Date: 09/28/2023 CLINICAL DATA:  Shortness of breath. EXAM: PORTABLE CHEST 1 VIEW COMPARISON:  09/25/2023. FINDINGS: Low lung volume. There are markedly increased alveolar and interstitial markings throughout bilateral lungs. Findings are nonspecific and differential diagnosis includes congestive heart failure/pulmonary edema, ARDS or multilobar pneumonia. Correlate clinically. Bilateral costophrenic angles are clear. Stable cardio-mediastinal silhouette. No acute osseous abnormalities. The soft tissues are within normal limits. IMPRESSION: *Markedly increased alveolar and interstitial markings throughout bilateral lungs. Findings are nonspecific and differential diagnosis includes congestive heart failure/pulmonary edema, ARDS or multilobar pneumonia. Electronically Signed   By: Ree Molt M.D.   On: 09/28/2023 08:26    Medications: Scheduled:  Chlorhexidine  Gluconate Cloth  6 each Topical Daily   cloNIDine   0.1 mg Oral BH-qamhs   Followed by   NOREEN ON 10/01/2023] cloNIDine   0.1 mg Oral QAC breakfast   furosemide   40 mg Intravenous Q12H   influenza vac split trivalent PF  0.5 mL Intramuscular Tomorrow-1000   metoprolol  tartrate  25 mg Oral BID   Continuous:  cefTRIAXone  (ROCEPHIN )  IV  Stopped (09/29/23 1150)   metronidazole  Stopped (09/29/23 1324)    Assessment/Plan: 1) Elevated liver enzymes - multifactoria. 2) Hepatic steatosis. 3) Cellulitis. 4) HCV RNA - pending.   The patient complained about pain with superficial palpation of her right side and the right mid thigh.  No erythema was noted.  Her lower extremity exhibited a response with the lasix  as there was mild edema.  Her liver enzymes are improving and her FIB-4 was at 4.46, which is consistent with advanced fibrosis.  Her HCV RNA is still pending.  The enzyme elevations may also be in part her recent sepsis.  AP> 1,000 can be associated with infection.  There does not appear to be any biliary ductal dilation.  The liver enzymes may be a reflection of a mild decompensation.  Plan: 1) Continue to provide supportive care. 2) Monitor liver enzymes. 3) Elastography can be obtained as an outpatient.  LOS: 3 days   Kitai Purdom D 09/29/2023, 4:45 PM

## 2023-09-29 NOTE — Progress Notes (Signed)
   09/29/23 1600  Spiritual Encounters  Type of Visit Initial  Care provided to: Patient  [place]inell partners present during Programmer, systems;Other (comment) (chaplain)  Referral source Nurse (RN/NT/LPN)  Reason for visit Urgent spiritual support   I responded to request from care team to offer support to Ms. [name] [name] as she experienced symptoms of anxiety and difficult breathing in withdrawal process.  Akyia received my visit positively and welcomed me to sit with her. She also shared how difficult her decision to come in for treatment felt because of the death of her daughter in 2019-10-30 at [facility]. She is also still recovering from grief over loss of her dad for whom she provided care as a teen in his last years. Her mother is in [place] .  I provided non-anxious presence and active listening. I offered compassionate support for the losses [name] [name] named and affirmed her choice to come in for her own care. I helped encourage mindful breathing and assistance to reduce anxiety symptoms.  Spiritual care team will continue to follow. Please page if urgent support needed. [name] [name].Div [phone]

## 2023-09-29 NOTE — Consult Note (Addendum)
 NAME:  [name] [name], MRN:  [id number], DOB:  1980-12-17, LOS: 3 ADMISSION DATE:  09/25/2023, CONSULTATION DATE:  09/29/23 REFERRING MD:  Jillian, CHIEF COMPLAINT:  withdrawal    History of Present Illness:  43 y.o. F with PMH significant for polysubstance abuse, anxiety, depression, septic emboli, necrotizing fascitis, spinal abscess who was admitted 9/6 with fever and L leg swelling (site of prior surgery) along with RUQ abdominal pain.  No n/v/d, or  fevers.  She was admitted and started on ceftriaxone  and flagyl .  CT abd/pelvis with with non-specific edema and splenomegaly.  CT LLE without evidence of osteomyelitis. CT chest consistent with multifocal PNA and RUQ US  showed concern for cholecystitis, HIDA normal.  ID following, initial HIV and HCV  positive, ID consulted and confirmatory labs pending.  Over her hospital course she developed worsening signs of withdrawal with increasing tachypnea and required HFNC so was transferred to step-down and PCCM consulted.   Pertinent  Medical History    has a past medical history of Alcohol  use disorder (01/04/2012), Anxiety, Chronic back pain, Cocaine abuse (HCC) (07/06/2013), Degenerative disc disease, Depression (05/22/2015), Endometriosis, GERD (gastroesophageal reflux disease), Migraine, Septic embolism (HCC), Spinal epidural abscess (10/21/2018), and Urinary retention.   Significant Hospital Events: Including procedures, antibiotic start and stop dates in addition to other pertinent events   9/9 PCCM consult, tachycardic, tachypneic and AMS   Interim History / Subjective:  Pt arousable, but confused and RR 40's on 10L HFNC   Objective    Blood pressure (!) 155/93, pulse (!) 109, temperature 98.5 F (36.9 C), temperature source Oral, resp. rate (!) 44, height 5' 5 (1.651 m), weight 109.1 kg, last menstrual period 08/10/2011, SpO2 100%.        Intake/Output Summary (Last 24 hours) at 09/29/2023 1744 Last data filed at 09/29/2023 1331 Gross  per 24 hour  Intake 278.69 ml  Output 2225 ml  Net -1946.31 ml   Filed Weights   09/26/23 1441  Weight: 109.1 kg   General:  ill-appearing F resting in bed in moderate respiratory distress HEENT: MM pink/moist Neuro: somnolent, but arousable to voice, disoriented but following commands  CV: s1s2 regular, tachycardic, no m/r/g PULM:  tachypneic, rhonchi throughout bilaterally  GI: soft, obese, non-tender  Extremities: warm/dry,  RLE with ruddy erythema, L shin has prior operative site without drainage     Resolved problem list   Assessment and Plan    Acute Hypoxic Respiratory Failure, ? Developing ARDS Sinus Tachycardia  Metabolic Encephalopathy Multifocal Pneumonia History of Polysubstance abuse  -stat CXR, ABG, lactic acid -continue HFNC, not a candidate for bipap with current mental status -at risk for intubation  -continue lasix   -respiratory viral panel, if coughing up sputum would culture  -ID following, continue ceftriaxone  and flagyl , suspect that her current worsening is secondary to withdrawal primarily, will start precedex , consider phenobarbital though is seems like she has primarily been an IVDA without high ETOH use, but confirm as able -echo with preserved EF and no clear endocarditis    RLE cellulitis  -continue abx per ID -retained staples from old surgery, per notes Dr. Harden contacted  Elevated Transaminates  Possible HCV, HIV  Positive CMV  -waiting for confirmatory testing per ID -MRCP scan WNL -LFT's improving     Labs   CBC: Recent Labs  Lab 09/26/23 0000 09/26/23 1545 09/27/23 0711 09/28/23 0237 09/29/23 0316  WBC 4.9 5.3 6.1 6.4 7.8  NEUTROABS 3.1  --   --   --   --  HGB 8.8* 10.1* 8.8* 8.9* 8.3*  HCT 28.0* 33.2* 28.3* 28.8* 26.5*  MCV 81.2 82.8 81.1 81.4 80.5  PLT 145* 137* 157 176 193    Basic Metabolic Panel: Recent Labs  Lab 09/25/23 2251 09/26/23 0921 09/27/23 0711 09/28/23 0237 09/29/23 0316  NA 135 134* 133*  134* 133*  K 5.1 4.6 4.5 4.2 4.1  CL 105 106 104 105 101  CO2 21* 21* 20* 19* 23  GLUCOSE 93 86 98 103* 120*  BUN 23* 22* 18 15 16   CREATININE 0.83 0.84 0.79 0.66 0.75  CALCIUM  8.3* 7.6* 7.9* 7.7* 7.8*  MG  --  2.2  --   --   --   PHOS  --  3.0  --   --   --    GFR: Estimated Creatinine Clearance: 109.1 mL/min (by C-G formula based on SCr of 0.75 mg/dL). Recent Labs  Lab 09/25/23 2228 09/26/23 0000 09/26/23 1545 09/27/23 0711 09/28/23 0237 09/29/23 0316  PROCALCITON  --   --   --  0.24  --   --   WBC  --    < > 5.3 6.1 6.4 7.8  LATICACIDVEN 1.9  --   --   --   --   --    < > = values in this interval not displayed.    Liver Function Tests: Recent Labs  Lab 09/25/23 2251 09/26/23 0921 09/27/23 0711 09/28/23 0237 09/29/23 0316  AST 177* 118* 151* 166* 124*  ALT 66* 49* 52* 52* 42  ALKPHOS 1,331* 1,061* 1,122* 1,118* 1,020*  BILITOT 2.7* 2.7* 2.9* 3.9* 4.8*  PROT 7.2 6.3* 6.7 6.4* 6.6  ALBUMIN  2.3* 2.0* 2.1* 1.9* 1.9*   No results for input(s): LIPASE, AMYLASE in the last 168 hours. No results for input(s): AMMONIA in the last 168 hours.  ABG    Component Value Date/Time   TCO2 20 09/10/2011 0449     Coagulation Profile: Recent Labs  Lab 09/26/23 1545  INR 1.0    Cardiac Enzymes: No results for input(s): CKTOTAL, CKMB, CKMBINDEX, TROPONINI in the last 168 hours.  HbA1C: Hgb A1c MFr Bld  Date/Time Value Ref Range Status  10/30/2019 03:48 AM 5.6 4.8 - 5.6 % Final    Comment:    (NOTE) Pre diabetes:          5.7%-6.4%  Diabetes:              >6.4%  Glycemic control for   <7.0% adults with diabetes   10/24/2019 08:20 AM 5.6 4.8 - 5.6 % Final    Comment:    (NOTE) Pre diabetes:          5.7%-6.4%  Diabetes:              >6.4%  Glycemic control for   <7.0% adults with diabetes     CBG: Recent Labs  Lab 09/26/23 0452  GLUCAP 84    Review of Systems:   Unable to obtain   Past Medical History:  She,  has a past medical  history of Alcohol  use disorder (01/04/2012), Anxiety, Chronic back pain, Cocaine abuse (HCC) (07/06/2013), Degenerative disc disease, Depression (05/22/2015), Endometriosis, GERD (gastroesophageal reflux disease), Migraine, Septic embolism (HCC), Spinal epidural abscess (10/21/2018), and Urinary retention.   Surgical History:   Past Surgical History:  Procedure Laterality Date   ABDOMINAL HYSTERECTOMY     APPENDECTOMY     APPLICATION OF WOUND VAC Left 10/28/2019   Procedure: APPLICATION OF WOUND VAC;  Surgeon: Harden Lame  V, MD;  Location: [facility] OR;  Service: Orthopedics;  Laterality: Left;   I & D EXTREMITY Left 10/22/2019   Procedure: IRRIGATION AND DEBRIDEMENT EXTREMITY; APPLICATION OF WOUND VAC;  Surgeon: Beverley Evalene BIRCH, MD;  Location: [facility] OR;  Service: Orthopedics;  Laterality: Left;   I & D EXTREMITY Left 10/26/2019   Procedure: DEBRIDEMENT LEFT TIBIA;  Surgeon: Harden Jerona GAILS, MD;  Location: [facility] OR;  Service: Orthopedics;  Laterality: Left;  left   I & D EXTREMITY Left 10/28/2019   Procedure: REPEAT DEBRIDEMENT LEFT TIBIA;  Surgeon: Harden Jerona GAILS, MD;  Location: [facility] OR;  Service: Orthopedics;  Laterality: Left;   IR THORACENTESIS ASP PLEURAL SPACE W/IMG GUIDE  10/28/2018   LAMINECTOMY FOR CEREBROSPINAL FLUID LEAK N/A 10/31/2018   Procedure: Thoracic Wound Exploration;  Surgeon: Gillie Duncans, MD;  Location: [facility] OR;  Service: Neurosurgery;  Laterality: N/A;   LAPAROSCOPIC ASSISTED VAGINAL HYSTERECTOMY  08/19/2011   Procedure: LAPAROSCOPIC ASSISTED VAGINAL HYSTERECTOMY;  Surgeon: Peggye Gull, MD;  Location: [facility] ORS;  Service: Gynecology;  Laterality: N/A;   TEE WITHOUT CARDIOVERSION N/A 10/25/2018   Procedure: TRANSESOPHAGEAL ECHOCARDIOGRAM (TEE);  Surgeon: Delford Maude BROCKS, MD;  Location: [facility] ENDOSCOPY;  Service: Cardiovascular;  Laterality: N/A;   THORACIC LAMINECTOMY FOR EPIDURAL ABSCESS N/A 10/20/2018   Procedure: THORACIC Eight, Thoracic nine LAMINECTOMY FOR EPIDURAL ABSCESS;  Surgeon: Joshua Alm RAMAN, MD;  Location: [facility] OR;  Service: Neurosurgery;  Laterality: N/A;   TONSILLECTOMY     TUBAL LIGATION       Social History:   reports that she has been smoking cigarettes. She has a 14 pack-year smoking history. She has never used smokeless tobacco. She reports current drug use. Drugs: Heroin and IV. She reports that she does not drink alcohol .   Family History:  Her family history includes Breast cancer in her mother; Cancer in her mother; Coronary artery disease in her father and maternal grandmother.   Allergies Allergies  Allergen Reactions   Epidural Tray [id number] [Nerve Block Tray] Other (See Comments)    Paralysis and severe pain in head/neck/shoulders.  No anaphylaxis.   Ibuprofen  Hives   Penicillins Hives     Tolerated Cephalosporin 10/22/2019.   Pt has tolerated cephalosporins on past admissions. Has patient had a PCN reaction causing immediate rash, facial/tongue/throat swelling, SOB or lightheadedness with hypotension: Yes Has patient had a PCN reaction causing severe rash involving mucus membranes or skin necrosis: No Has patient had a PCN reaction that required hospitalization No Has patient had a PCN reaction occurring within the last 10 years: No If all of the above answers are NO, then may   Zofran  [Ondansetron  Hcl] Nausea And Vomiting     Home Medications  Prior to Admission medications   Not on File     Critical care time: 40 minutes    CRITICAL CARE Performed by: Leita SAUNDERS Jeanice Dempsey   Total critical care time: 40 minutes  Critical care time was exclusive of separately billable procedures and treating other patients.  Critical care was necessary to treat or prevent imminent or life-threatening deterioration.  Critical care was time spent personally by me on the following activities: development of treatment plan with patient and/or surrogate as well as nursing, discussions with consultants, evaluation of patient's response to treatment,  examination of patient, obtaining history from patient or surrogate, ordering and performing treatments and interventions, ordering and review of laboratory studies, ordering and review of radiographic studies, pulse oximetry and re-evaluation of patient's condition.  Leita SAUNDERS Tannis Burstein, PA-C [facility] See Amion for pager If no response to pager , please call [phone] until 7pm After 7:00 pm call Elink  [phone]

## 2023-09-30 DIAGNOSIS — R451 Restlessness and agitation: Secondary | ICD-10-CM

## 2023-09-30 DIAGNOSIS — J189 Pneumonia, unspecified organism: Secondary | ICD-10-CM | POA: Insufficient documentation

## 2023-09-30 DIAGNOSIS — J9601 Acute respiratory failure with hypoxia: Secondary | ICD-10-CM | POA: Diagnosis not present

## 2023-09-30 DIAGNOSIS — A419 Sepsis, unspecified organism: Secondary | ICD-10-CM | POA: Diagnosis not present

## 2023-09-30 DIAGNOSIS — F1123 Opioid dependence with withdrawal: Secondary | ICD-10-CM | POA: Diagnosis not present

## 2023-09-30 DIAGNOSIS — J181 Lobar pneumonia, unspecified organism: Secondary | ICD-10-CM

## 2023-09-30 DIAGNOSIS — L03115 Cellulitis of right lower limb: Secondary | ICD-10-CM | POA: Diagnosis not present

## 2023-09-30 DIAGNOSIS — R0902 Hypoxemia: Secondary | ICD-10-CM | POA: Diagnosis not present

## 2023-09-30 DIAGNOSIS — R7401 Elevation of levels of liver transaminase levels: Secondary | ICD-10-CM | POA: Diagnosis not present

## 2023-09-30 DIAGNOSIS — R7989 Other specified abnormal findings of blood chemistry: Secondary | ICD-10-CM | POA: Diagnosis not present

## 2023-09-30 DIAGNOSIS — G9341 Metabolic encephalopathy: Secondary | ICD-10-CM

## 2023-09-30 DIAGNOSIS — F1193 Opioid use, unspecified with withdrawal: Secondary | ICD-10-CM

## 2023-09-30 LAB — RESPIRATORY PANEL BY PCR

## 2023-09-30 LAB — COMPREHENSIVE METABOLIC PANEL WITH GFR
ALT: 33 U/L (ref 0–44)
AST: 97 U/L — ABNORMAL HIGH (ref 15–41)
Albumin: 1.9 g/dL — ABNORMAL LOW (ref 3.5–5.0)
Alkaline Phosphatase: 911 U/L — ABNORMAL HIGH (ref 38–126)
Anion gap: 12 (ref 5–15)
BUN: 16 mg/dL (ref 6–20)
CO2: 21 mmol/L — ABNORMAL LOW (ref 22–32)
Calcium: 7.9 mg/dL — ABNORMAL LOW (ref 8.9–10.3)
Chloride: 102 mmol/L (ref 98–111)
Creatinine, Ser: 0.74 mg/dL (ref 0.44–1.00)
GFR, Estimated: >60 mL/min (ref >60)
Glucose, Bld: 119 mg/dL — ABNORMAL HIGH (ref 70–99)
Potassium: 4.1 mmol/L (ref 3.5–5.1)
Sodium: 135 mmol/L (ref 135–145)
Total Bilirubin: 2.7 mg/dL — ABNORMAL HIGH (ref 0.0–1.2)
Total Protein: 6.7 g/dL (ref 6.5–8.1)

## 2023-09-30 LAB — CBC
HCT: 26.9 % — ABNORMAL LOW (ref 36.0–46.0)
Hemoglobin: 8.2 g/dL — ABNORMAL LOW (ref 12.0–15.0)
MCH: 25.5 pg — ABNORMAL LOW (ref 26.0–34.0)
MCHC: 30.5 g/dL (ref 30.0–36.0)
MCV: 83.8 fL (ref 80.0–100.0)
Platelets: 190 K/uL (ref 150–400)
RBC: 3.21 MIL/uL — ABNORMAL LOW (ref 3.87–5.11)
RDW: 20.5 % — ABNORMAL HIGH (ref 11.5–15.5)
WBC: 6.7 K/uL (ref 4.0–10.5)
nRBC: 0 % (ref 0.0–0.2)

## 2023-09-30 LAB — HIV-1/2 AB - DIFFERENTIATION
HIV 1 Ab: NONREACTIVE
HIV 2 Ab: NONREACTIVE
Note: NEGATIVE

## 2023-09-30 LAB — HCV RNA QUANT RFLX ULTRA OR GENOTYP
HCV RNA Qnt(log copy/mL): UNDETERMINED {Log_IU}/mL
HepC Qn: NOT DETECTED [IU]/mL

## 2023-09-30 LAB — HIV-1/HIV-2 QUALITATIVE RNA
Final Interpretation: NEGATIVE
HIV-1 RNA, Qualitative: NONREACTIVE
HIV-2 RNA, Qualitative: NONREACTIVE

## 2023-09-30 LAB — QUANTIFERON-TB GOLD PLUS: QuantiFERON-TB Gold Plus: UNDETERMINED — AB

## 2023-09-30 LAB — QUANTIFERON-TB GOLD PLUS (RQFGPL)
QuantiFERON Mitogen Value: 0.26 [IU]/mL
QuantiFERON Nil Value: 0.19 [IU]/mL
QuantiFERON TB1 Ag Value: 0.34 [IU]/mL
QuantiFERON TB2 Ag Value: 0.33 [IU]/mL

## 2023-09-30 LAB — T.PALLIDUM AB, TOTAL: T Pallidum Abs: NONREACTIVE

## 2023-09-30 MED ORDER — CEFADROXIL 500 MG PO CAPS
1000.0000 mg | ORAL_CAPSULE | Freq: Two times a day (BID) | ORAL | Status: DC
Start: 2023-09-30 — End: 2023-09-30
  Administered 2023-09-30: 1000 mg via ORAL
  Filled 2023-09-30: qty 2

## 2023-09-30 MED ORDER — CLONIDINE HCL 0.1 MG PO TABS
0.1000 mg | ORAL_TABLET | Freq: Three times a day (TID) | ORAL | Status: AC
  Administered 2023-09-30 – 2023-10-01 (×6): 0.1 mg via ORAL
  Filled 2023-09-30 (×6): qty 1

## 2023-09-30 MED ORDER — CHLORHEXIDINE GLUCONATE CLOTH 2 % EX PADS
6.0000 | MEDICATED_PAD | Freq: Every day | CUTANEOUS | Status: DC
  Administered 2023-09-30 – 2023-10-03 (×4): 6 via TOPICAL

## 2023-09-30 MED ORDER — CLONIDINE HCL 0.1 MG PO TABS
0.1000 mg | ORAL_TABLET | Freq: Every day | ORAL | Status: AC
Start: 2023-10-04 — End: 2023-10-05
  Administered 2023-10-04 – 2023-10-05 (×2): 0.1 mg via ORAL
  Filled 2023-09-30 (×2): qty 1

## 2023-09-30 MED ORDER — SODIUM CHLORIDE 0.9 % IV SOLN
2.0000 g | INTRAVENOUS | Status: DC
  Administered 2023-09-30 – 2023-10-02 (×3): 2 g via INTRAVENOUS
  Filled 2023-09-30 (×3): qty 20

## 2023-09-30 MED ORDER — CLONIDINE HCL 0.1 MG PO TABS
0.1000 mg | ORAL_TABLET | ORAL | Status: AC
Start: 2023-10-02 — End: 2023-10-04
  Administered 2023-10-02 – 2023-10-03 (×4): 0.1 mg via ORAL
  Filled 2023-09-30 (×4): qty 1

## 2023-09-30 NOTE — Progress Notes (Signed)
 [facility]  Date of Admission:  09/25/2023      Abx: 9/6-c vanc 9/6-c ceftriaxone  9/6-c metronidazole    ASSESSMENT: 43 yo female anxiety, lumbago, substance abuse, hx epidural spinal abscess, admitted with anasarca and also rle cellulitis meeting sepsis criteria (febrile to 101.7), along with having extremely elevated alkphos out of proption to ast/alt, and a reactive hiv test pending confirmation  Initial bcx 1 set staph epi unclear significance  Uds with amphetamines/fentanyl   RLE Cellulitis improving on abx  Hiv testing hasn't discussed with patient --waiting on confirmation. High risk with substance abuse. Is together with a long term partner but not sexually active. Previous hiv test (most recent one 5 years prior, per her report, was negative)  9/6 ct rle No acute bony abnormality. No bone destruction to suggest osteomyelitis. Edema throughout the subcutaneous soft tissues in the left lower extremity from the distal thigh through the calf could be postoperative or related to cellulitis.  9/5 cxr clear  9/6 liver u/s 1. Findings are equivocal for acute cholecystitis. A positive sonographic murphy sign was noted by the sonographer and there is mild dilation of the common bile duct. However, there is no cholelithiasis, gallbladder wall thickening, or pericholecystic fluid. Consider HIDA scan for further evaluation.  9/7 hida scan negative  9/8 echo unremarkable  Lft elevation but no sx from cholecystitis/cholangitis standpoint, will benefit from further GI input. ID infectious process that caused this usually is spirochetes such as leptospirosis, syphilis, lyme. Reasonable to check for syphilis, but suspect liver cirrhosis (which could explain the anasarca). Or her anasarca/right sided volume overload could also be explained by pulm htn. She does have smoking hx as well   -------- 9/10 id assessment Patient appears to be  withdrawing (opioid-fentanyl  use)  Remains very hypoxic Gi not reviewed - concerned for advance fibrosis/cirrhosis; no inpatient further w/u planned.   I am not concerned for a biliary infection. Cellulitis seems to be the only infectious syndrome here and improving  Hiv rna negative; hiv screen likely false positive  Hcv rna in progress   I discussed with pulm/ccm team, concern for aspiration pna/ards on admission ct (PE was r/o'ed) Mrsa nares pcr screen negative   Pcn allergy   Assymptomatic bacteriuria CoNS blood cx positive is a contaminant   PLAN: ID can f/u hcv testing Given concern for aspiration pna, we'll keep on ceftriaxone  for total 7-10 days (per pulmonary discretion) and then transition to cefadroxil  to finish the 2 week course cellulitis coverage Maintain standard isolation precaution I do not suspect CoNS to be of any significance and will  not pursue further w/u for this Id will sign off Discussed with primary team  Principal Problem:   Elevated LFTs Active Problems:   MDD (major depressive disorder), recurrent severe, without psychosis (HCC)   RUQ pain   Substance abuse (HCC)   Cellulitis of both lower extremities   Normocytic anemia   Protein-calorie malnutrition, severe (HCC)   Hyponatremia   Thrombocytopenia (HCC)   Acute respiratory distress   Allergies  Allergen Reactions   Epidural Tray [id number] [Nerve Block Tray] Other (See Comments)    Paralysis and severe pain in head/neck/shoulders.  No anaphylaxis.   Ibuprofen  Hives   Penicillins Hives     Tolerated Cephalosporin 10/22/2019.   Pt has tolerated cephalosporins on past admissions. Has patient had a PCN reaction causing immediate rash, facial/tongue/throat swelling, SOB or lightheadedness with hypotension: Yes Has patient had a  PCN reaction causing severe rash involving mucus membranes or skin necrosis: No Has patient had a PCN reaction that required hospitalization No Has patient  had a PCN reaction occurring within the last 10 years: No If all of the above answers are NO, then may   Zofran  [Ondansetron  Hcl] Nausea And Vomiting    Scheduled Meds:  cefadroxil   1,000 mg Oral BID   Chlorhexidine  Gluconate Cloth  6 each Topical Q2200   cloNIDine   0.1 mg Oral TID   Followed by   NOREEN ON 10/02/2023] cloNIDine   0.1 mg Oral BH-qamhs   Followed by   NOREEN ON 10/04/2023] cloNIDine   0.1 mg Oral Daily   furosemide   40 mg Intravenous Q12H   influenza vac split trivalent PF  0.5 mL Intramuscular Tomorrow-1000   Continuous Infusions:  dexmedetomidine  (PRECEDEX ) IV infusion 1 mcg/kg/hr (09/30/23 0753)   PRN Meds:.acetaminophen , albuterol , dicyclomine , HYDROmorphone  (DILAUDID ) injection, ketorolac , loperamide , LORazepam , methocarbamol , nicotine , ondansetron , mouth rinse, oxyCODONE , oxymetazoline , prochlorperazine , sodium chloride  flush   SUBJECTIVE: Remains on high o2 requirement Gi note reviewed  Concern for opioid withdrawal    Review of Systems: ROS All other ROS was negative, except mentioned above     OBJECTIVE: Vitals:   09/30/23 0700 09/30/23 0800 09/30/23 0900 09/30/23 1000  BP: 113/71 134/89 114/77 124/80  Pulse: 79 94 81 83  Resp: (!) 44 (!) 44 (!) 40 (!) 45  Temp:  98.6 F (37 C)    TempSrc:  Oral    SpO2: 95% 95% 94% 95%  Weight:      Height:       Body mass index is 40.02 kg/m.  Physical Exam General/constitutional: sleepy/lethargic; on 8-10 liters o2 requirement HEENT: Normocephalic CV: tachy Lungs: normal respiratory effort Abd: Soft, Nontender Ext: 1-2+ bilateral lower edema Skin: wrinkled warm red skin rle vs left -- nontender Neuro: defer as patient lethargic/sleepy MSK: no peripheral joint swelling/tenderness/warmth; back spines nontender  Lab Results Lab Results  Component Value Date   WBC 6.7 09/30/2023   HGB 8.2 (L) 09/30/2023   HCT 26.9 (L) 09/30/2023   MCV 83.8 09/30/2023   PLT 190 09/30/2023    Lab Results   Component Value Date   CREATININE 0.74 09/30/2023   BUN 16 09/30/2023   NA 135 09/30/2023   K 4.1 09/30/2023   CL 102 09/30/2023   CO2 21 (L) 09/30/2023    Lab Results  Component Value Date   ALT 33 09/30/2023   AST 97 (H) 09/30/2023   ALKPHOS 911 (H) 09/30/2023   BILITOT 2.7 (H) 09/30/2023      Microbiology: Recent Results (from the past 240 hours)  Resp panel by RT-PCR (RSV, Flu A&B, Covid) Anterior Nasal Swab     Status: None   Collection Time: 09/25/23  9:54 PM   Specimen: Anterior Nasal Swab  Result Value Ref Range Status   SARS Coronavirus 2 by RT PCR NEGATIVE NEGATIVE Final    Comment: (NOTE) SARS-CoV-2 target nucleic acids are NOT DETECTED.  The SARS-CoV-2 RNA is generally detectable in upper respiratory specimens during the acute phase of infection. The lowest concentration of SARS-CoV-2 viral copies this assay can detect is 138 copies/mL. A negative result does not preclude SARS-Cov-2 infection and should not be used as the sole basis for treatment or other patient management decisions. A negative result may occur with  improper specimen collection/handling, submission of specimen other than nasopharyngeal swab, presence of viral mutation(s) within the areas targeted by this assay, and inadequate number of viral  copies(<138 copies/mL). A negative result must be combined with clinical observations, patient history, and epidemiological information. The expected result is Negative.  Fact Sheet for Patients:  BloggerCourse.com  Fact Sheet for Healthcare Providers:  SeriousBroker.it  This test is no t yet approved or cleared by the [place]  FDA and  has been authorized for detection and/or diagnosis of SARS-CoV-2 by FDA under an Emergency Use Authorization (EUA). This EUA will remain  in effect (meaning this test can be used) for the duration of the COVID-19 declaration under Section 564(b)(1) of the Act,  21 U.S.C.section 360bbb-3(b)(1), unless the authorization is terminated  or revoked sooner.       Influenza A by PCR NEGATIVE NEGATIVE Final   Influenza B by PCR NEGATIVE NEGATIVE Final    Comment: (NOTE) The Xpert Xpress SARS-CoV-2/FLU/RSV plus assay is intended as an aid in the diagnosis of influenza from Nasopharyngeal swab specimens and should not be used as a sole basis for treatment. Nasal washings and aspirates are unacceptable for Xpert Xpress SARS-CoV-2/FLU/RSV testing.  Fact Sheet for Patients: BloggerCourse.com  Fact Sheet for Healthcare Providers: SeriousBroker.it  This test is not yet approved or cleared by the [place]  FDA and has been authorized for detection and/or diagnosis of SARS-CoV-2 by FDA under an Emergency Use Authorization (EUA). This EUA will remain in effect (meaning this test can be used) for the duration of the COVID-19 declaration under Section 564(b)(1) of the Act, 21 U.S.C. section 360bbb-3(b)(1), unless the authorization is terminated or revoked.     Resp Syncytial Virus by PCR NEGATIVE NEGATIVE Final    Comment: (NOTE) Fact Sheet for Patients: BloggerCourse.com  Fact Sheet for Healthcare Providers: SeriousBroker.it  This test is not yet approved or cleared by the [place]  FDA and has been authorized for detection and/or diagnosis of SARS-CoV-2 by FDA under an Emergency Use Authorization (EUA). This EUA will remain in effect (meaning this test can be used) for the duration of the COVID-19 declaration under Section 564(b)(1) of the Act, 21 U.S.C. section 360bbb-3(b)(1), unless the authorization is terminated or revoked.  Performed at [facility], [address] [place], [place] [address]   Culture, blood (Routine X 2) w Reflex to ID Panel     Status: None (Preliminary result)   Collection Time: 09/25/23  9:54  PM   Specimen: BLOOD  Result Value Ref Range Status   Specimen Description   Final    BLOOD BLOOD RIGHT ARM Performed at [facility], [address], [place], [place] [address]    Special Requests   Final    BOTTLES DRAWN AEROBIC AND ANAEROBIC Blood Culture results may not be optimal due to an inadequate volume of blood received in culture bottles Performed at [facility], [address] [place], [place] [address]    Culture   Final    NO GROWTH 4 DAYS Performed at [facility], [address], [place], [place] [address]    Report Status PENDING  Incomplete  Culture, blood (Routine X 2) w Reflex to ID Panel     Status: Abnormal   Collection Time: 09/25/23  9:54 PM   Specimen: BLOOD LEFT ARM  Result Value Ref Range Status   Specimen Description   Final    BLOOD LEFT ARM Performed at [facility], [address], [place], [place] [address]    Special Requests   Final    BOTTLES DRAWN AEROBIC AND ANAEROBIC Blood Culture adequate  volume Performed at [facility], [address] [place], [place] [address]    Culture  Setup Time   Final    GRAM POSITIVE COCCI IN CLUSTERS IN BOTH AEROBIC AND ANAEROBIC BOTTLES CRITICAL RESULT CALLED TO, READ BACK BY AND VERIFIED WITH: PHARMD E JACKSON 09/26/2023 @ 2338 BY AB    Culture (A)  Final    STAPHYLOCOCCUS EPIDERMIDIS THE SIGNIFICANCE OF ISOLATING THIS ORGANISM FROM A SINGLE SET OF BLOOD CULTURES WHEN MULTIPLE SETS ARE DRAWN IS UNCERTAIN. PLEASE NOTIFY THE MICROBIOLOGY DEPARTMENT WITHIN ONE WEEK IF SPECIATION AND SENSITIVITIES ARE REQUIRED. Performed at [facility], [address] [place], [place] [address]    Report Status 09/28/2023 FINAL  Final  Urine Culture     Status: Abnormal   Collection Time: 09/25/23  9:54 PM   Specimen: Urine, Random  Result Value Ref Range Status   Specimen Description   Final    URINE, RANDOM Performed at [facility], [address], [place], [place] [address]    Special Requests   Final    NONE Reflexed from [id number] Performed at [facility], [address], [place], [place] [address]    Culture >=100,000 COLONIES/mL SERRATIA MARCESCENS (A)  Final   Report Status 09/28/2023 FINAL  Final   Organism ID, Bacteria SERRATIA MARCESCENS (A)  Final      Susceptibility   Serratia marcescens - MIC*    CEFEPIME  <=0.12 SENSITIVE Sensitive     ERTAPENEM <=0.12 SENSITIVE Sensitive     CEFTRIAXONE  <=0.25 SENSITIVE Sensitive     CIPROFLOXACIN  <=0.06 SENSITIVE Sensitive     GENTAMICIN <=1 SENSITIVE Sensitive     NITROFURANTOIN  128 RESISTANT Resistant     TRIMETH/SULFA <=20 SENSITIVE Sensitive     MEROPENEM <=0.25 SENSITIVE Sensitive     * >=100,000 COLONIES/mL SERRATIA MARCESCENS  Blood Culture ID Panel (Reflexed)     Status: Abnormal   Collection Time: 09/25/23  9:54 PM  Result Value Ref Range Status   Enterococcus faecalis NOT DETECTED NOT DETECTED Final   Enterococcus Faecium NOT DETECTED NOT DETECTED Final   Listeria monocytogenes NOT DETECTED NOT DETECTED Final   Staphylococcus species DETECTED (A) NOT DETECTED Final    Comment: CRITICAL RESULT CALLED TO, READ BACK BY AND VERIFIED WITH: PHARMD E JACKSON 09/26/2023 @ 2338 BY AB    Staphylococcus aureus (BCID) NOT DETECTED NOT DETECTED Final   Staphylococcus epidermidis DETECTED (A) NOT DETECTED Final    Comment: CRITICAL RESULT CALLED TO, READ BACK BY AND VERIFIED WITH: PHARMD E JACKSON 09/26/2023 @ 2338 BY AB    Staphylococcus lugdunensis NOT DETECTED NOT DETECTED Final   Streptococcus species NOT DETECTED NOT DETECTED Final   Streptococcus agalactiae NOT DETECTED NOT DETECTED Final   Streptococcus pneumoniae NOT DETECTED NOT DETECTED Final   Streptococcus pyogenes NOT DETECTED NOT DETECTED Final   A.calcoaceticus-baumannii NOT DETECTED NOT DETECTED Final   Bacteroides fragilis NOT DETECTED NOT DETECTED Final    Enterobacterales NOT DETECTED NOT DETECTED Final   Enterobacter cloacae complex NOT DETECTED NOT DETECTED Final   Escherichia coli NOT DETECTED NOT DETECTED Final   Klebsiella aerogenes NOT DETECTED NOT DETECTED Final   Klebsiella oxytoca NOT DETECTED NOT DETECTED Final   Klebsiella pneumoniae NOT DETECTED NOT DETECTED Final   Proteus species NOT DETECTED NOT DETECTED Final   Salmonella species NOT DETECTED NOT DETECTED Final   Serratia marcescens NOT DETECTED NOT DETECTED Final   Haemophilus influenzae NOT DETECTED NOT DETECTED Final  Neisseria meningitidis NOT DETECTED NOT DETECTED Final   Pseudomonas aeruginosa NOT DETECTED NOT DETECTED Final   Stenotrophomonas maltophilia NOT DETECTED NOT DETECTED Final   Candida albicans NOT DETECTED NOT DETECTED Final   Candida auris NOT DETECTED NOT DETECTED Final   Candida glabrata NOT DETECTED NOT DETECTED Final   Candida krusei NOT DETECTED NOT DETECTED Final   Candida parapsilosis NOT DETECTED NOT DETECTED Final   Candida tropicalis NOT DETECTED NOT DETECTED Final   Cryptococcus neoformans/gattii NOT DETECTED NOT DETECTED Final   Methicillin resistance mecA/C NOT DETECTED NOT DETECTED Final    Comment: Performed at [facility], [address], [place], [place] [address]  Culture, blood (Routine X 2) w Reflex to ID Panel     Status: None (Preliminary result)   Collection Time: 09/27/23  9:56 AM   Specimen: BLOOD RIGHT ARM  Result Value Ref Range Status   Specimen Description   Final    BLOOD RIGHT ARM Performed at [facility], [address] [place], [place] [address]    Special Requests   Final    BOTTLES DRAWN AEROBIC AND ANAEROBIC Blood Culture results may not be optimal due to an inadequate volume of blood received in culture bottles Performed at [facility], [address] [place], [place] [address]    Culture   Final    NO GROWTH 3 DAYS Performed at [facility], [address],  [place], [place] [address]    Report Status PENDING  Incomplete  Culture, blood (Routine X 2) w Reflex to ID Panel     Status: None (Preliminary result)   Collection Time: 09/27/23  9:56 AM   Specimen: BLOOD RIGHT HAND  Result Value Ref Range Status   Specimen Description   Final    BLOOD RIGHT HAND Performed at [facility], [address] [place], [place] [address]    Special Requests   Final    BOTTLES DRAWN AEROBIC AND ANAEROBIC Blood Culture results may not be optimal due to an inadequate volume of blood received in culture bottles Performed at [facility], [address] [place], [place] [address]    Culture   Final    NO GROWTH 3 DAYS Performed at [facility], [address], [place], [place] [address]    Report Status PENDING  Incomplete  MRSA Next Gen by PCR, Nasal     Status: None   Collection Time: 09/28/23  9:23 AM   Specimen: Nasal Mucosa; Nasal Swab  Result Value Ref Range Status   MRSA by PCR Next Gen NOT DETECTED NOT DETECTED Final    Comment: (NOTE) The GeneXpert MRSA Assay (FDA approved for NASAL specimens only), is one component of a comprehensive MRSA colonization surveillance program. It is not intended to diagnose MRSA infection nor to guide or monitor treatment for MRSA infections. Test performance is not FDA approved in patients less than 48 years old. Performed at [facility], [address] [place], [place] [address]   Respiratory (~20 pathogens) panel by PCR     Status: None   Collection Time: 09/29/23  8:07 PM   Specimen: Nasopharyngeal Swab; Respiratory  Result Value Ref Range Status   Adenovirus NOT DETECTED NOT DETECTED Final   Coronavirus 229E NOT DETECTED NOT DETECTED Final    Comment: (NOTE) The Coronavirus on the Respiratory Panel, DOES NOT test for the novel  Coronavirus (2019 nCoV)    Coronavirus  HKU1 NOT DETECTED NOT DETECTED Final   Coronavirus NL63 NOT DETECTED NOT DETECTED Final    Coronavirus OC43 NOT DETECTED NOT DETECTED Final   Metapneumovirus NOT DETECTED NOT DETECTED Final   Rhinovirus / Enterovirus NOT DETECTED NOT DETECTED Final   Influenza A NOT DETECTED NOT DETECTED Final   Influenza B NOT DETECTED NOT DETECTED Final   Parainfluenza Virus 1 NOT DETECTED NOT DETECTED Final   Parainfluenza Virus 2 NOT DETECTED NOT DETECTED Final   Parainfluenza Virus 3 NOT DETECTED NOT DETECTED Final   Parainfluenza Virus 4 NOT DETECTED NOT DETECTED Final   Respiratory Syncytial Virus NOT DETECTED NOT DETECTED Final   Bordetella pertussis NOT DETECTED NOT DETECTED Final   Bordetella Parapertussis NOT DETECTED NOT DETECTED Final   Chlamydophila pneumoniae NOT DETECTED NOT DETECTED Final   Mycoplasma pneumoniae NOT DETECTED NOT DETECTED Final    Comment: Performed at [facility], [address], [place], [place] [address]     Serology:   Imaging: If present, new imagings (plain films, ct scans, and mri) have been personally visualized and interpreted; radiology reports have been reviewed. Decision making incorporated into the Impression / Recommendations.  See others in a&p  9/8 tte  1. Left ventricular ejection fraction, by estimation, is 60 to 65%. The  left ventricle has normal function. The left ventricle has no regional  wall motion abnormalities. Left ventricular diastolic parameters were  normal.   2. Right ventricular systolic function is normal. The right ventricular  size is normal. There is mildly elevated pulmonary artery systolic  pressure.   3. Left atrial size was mildly dilated.   4. The mitral valve is abnormal. Trivial mitral valve regurgitation. No  evidence of mitral stenosis.   5. The aortic valve is tricuspid. There is mild calcification of the  aortic valve. Aortic valve regurgitation is not visualized. Aortic valve  sclerosis is present, with no evidence of aortic valve stenosis.   6. The inferior vena cava is normal in size with  greater than 50%  respiratory variability, suggesting right atrial pressure of 3 mmHg.    9/6 cta chest, abd pelv 1. No definite evidence of pulmonary embolus. 2. Diffuse patchy and interstitial opacities are noted throughout both lungs concerning for edema or possibly multifocal pneumonia. 3. Hepatic steatosis.  1. Nonspecific edema in the subcutaneous fat of the bilateral flanks, pannus, and thighs (right greater than left). Correlate for cellulitis. 2. Hepatic steatosis. 3. Splenomegaly with a hypoattenuating lesion in the posterior spleen, likely a benign cyst or hemangioma.   Constance ONEIDA Passer, MD [facility] [phone] pager    09/30/2023, 11:33 AM

## 2023-09-30 NOTE — Progress Notes (Signed)
 NAME:  [name] [name], MRN:  [id number], DOB:  28-Mar-1980, LOS: 4 ADMISSION DATE:  09/25/2023, CONSULTATION DATE:  09/30/23 REFERRING MD:  Jillian, CHIEF COMPLAINT:  withdrawal    History of Present Illness:  43 y.o. F with PMH significant for polysubstance abuse, anxiety, depression, septic emboli, necrotizing fascitis, spinal abscess who was admitted 9/6 with fever and L leg swelling (site of prior surgery) along with RUQ abdominal pain.  No n/v/d, or  fevers.  She was admitted and started on ceftriaxone  and flagyl .  CT abd/pelvis with with non-specific edema and splenomegaly.  CT LLE without evidence of osteomyelitis. CT chest consistent with multifocal PNA and RUQ US  showed concern for cholecystitis, HIDA normal.  ID following, initial HIV and HCV  positive, ID consulted and confirmatory labs pending.  Over her hospital course she developed worsening signs of withdrawal with increasing tachypnea and required HFNC so was transferred to step-down and PCCM consulted.   Pertinent  Medical History    has a past medical history of Alcohol  use disorder (01/04/2012), Anxiety, Chronic back pain, Cocaine abuse (HCC) (07/06/2013), Degenerative disc disease, Depression (05/22/2015), Endometriosis, GERD (gastroesophageal reflux disease), Migraine, Septic embolism (HCC), Spinal epidural abscess (10/21/2018), and Urinary retention.   Significant Hospital Events: Including procedures, antibiotic start and stop dates in addition to other pertinent events   9/9 PCCM consult, tachycardic, tachypneic and AMS   Interim History / Subjective:  Still a bit somnolent but does awaken to voice. Able to tell me her name, where she is, thought it was Tuesday instead of Wednesday. Slightly tachypneic but stable on 8L HFNC.  Objective    Blood pressure 113/71, pulse 79, temperature 98.6 F (37 C), temperature source Oral, resp. rate (!) 44, height 5' 5 (1.651 m), weight 109.1 kg, last menstrual period 08/10/2011, SpO2  95%.        Intake/Output Summary (Last 24 hours) at 09/30/2023 9171 Last data filed at 09/30/2023 0556 Gross per 24 hour  Intake 652.28 ml  Output 2300 ml  Net -1647.72 ml   Filed Weights   09/26/23 1441  Weight: 109.1 kg   General:  ill-appearing F resting in bed in NAD HEENT: MM pink/moist Neuro: somnolent, but arousable to voice, oriented to person and place, confused on date (thinks Tuesday vs Wednesday) CV: RRR, no M/R/G PULM:  tachypneic, rhonchi bilaterally  GI: soft, obese, non-tender  Extremities: warm/dry,  RLE with venous stasis changes, erythema, thickened skin.  L shin has prior operative site without drainage and she states she has retained staples from a surgery back in 2021   Assessment and Plan    Acute Hypoxic Respiratory Failure, ? Developing ARDS Multifocal Pneumonia Metabolic Encephalopathy History of Polysubstance abuse with withdrawal -continue HFNC, not a candidate for bipap with current mental status -continue lasix  as able, goal continue net neg balance -ID following, continue ceftriaxone  and flagyl , suspect that her current worsening is secondary to withdrawal primarily -continue Precedex , wean as able -Start Clonidine  taper as adjunct to Precedex , try to wean Precedex  -continue Ativan  PRN  RLE cellulitis  -continue abx per ID -retained staples from old surgery, per notes Dr. Harden contacted  Elevated Transaminates - -NM biliary scan WNL, LFTs gradually improving Possible HCV, HIV  Positive CMV  -waiting for confirmatory testing per ID -continue supportive care -Possible MRCP at some point?   Rest per primary team.   Sammi Gore, PA - [facility] For pager details, please see AMION or use Epic chat  After 1900, please call [facility] for cross coverage needs 09/30/2023, 8:50 AM

## 2023-09-30 NOTE — Plan of Care (Signed)
   Problem: Health Behavior/Discharge Planning: Goal: Ability to manage health-related needs will improve Outcome: Progressing   Problem: Nutrition: Goal: Adequate nutrition will be maintained Outcome: Progressing   Problem: Coping: Goal: Level of anxiety will decrease Outcome: Progressing

## 2023-09-30 NOTE — Plan of Care (Signed)
 Patient requires PRN IV dilaudid  1 mg every three hours for pain she rates 7/10 along with PRN IV Toradol  15 mg every six hours and PRN oxycodone  10 mg also for pain she rates 6 or 7/10 with no relief, remains on 8 liters high flow nasal cannula, continues to have expiratory wheezing and remain tachypnea even at rest,patient requires someone to stay in room with her and help her do breathing exercises to help her relax, Precedex  is currently at 1.1 mcg/hr with little effect ( max is 1.2 mcg) Ativan  1 mg is also given every six hours PRN, wound care done daily see orders, dressing was done twice today, this morning and this evening. Plan of care and goals reviewed with time for questions, patient handbook/guide at bedside, bed in lowest locked position with call bell in reach, bed alarm on, mat down on floor, side rails up and lights on (Per patient request), patient states she is claustrophobia and wishes to have door left open with curtain pulled back.  Problem: Education: Goal: Knowledge of General Education information will improve Description: Including pain rating scale, medication(s)/side effects and non-pharmacologic comfort measures Outcome: Progressing

## 2023-09-30 NOTE — Progress Notes (Signed)
 UNASSIGNED PATIENT Subjective: Patient denies any active active GI complaints at this time.  She was asleep with increased work of breathing and seems quite uncomfortable.  She denies having abdominal pain nausea or vomiting.  Objective: Vital signs in last 24 hours: Temp:  [98 F (36.7 C)-99.5 F (37.5 C)] 99.1 F (37.3 C) (09/10 1200) Pulse Rate:  [79-113] 85 (09/10 1200) Resp:  [28-51] 46 (09/10 1200) BP: (112-155)/(69-98) 146/98 (09/10 1455) SpO2:  [91 %-100 %] 95 % (09/10 1200) Last BM Date : 09/29/23  Intake/Output from previous day: 09/09 0701 - 09/10 0700 In: 652.3 [P.O.:118; I.V.:240.2; IV Piggyback:294.1] Out: 2300 [Urine:2300] Intake/Output this shift: Total I/O In: 329.3 [I.V.:229.3; IV Piggyback:100] Out: -   General appearance: Somnolent, appears older than stated age, fatigued, moderate distress with increased work of breathing, morbidly obese, pale, and slowed mentation Resp: rhonchi anterior - bilateral and bilaterally Cardio: regular rate and rhythm, S1, S2 normal, no murmur, click, rub or gallop GI: soft, non-tender; bowel sounds normal; no masses,  no organomegaly Extremities: venous stasis dermatitis noted; dressing over the left lower leg  Lab Results: Recent Labs    09/29/23 0316 09/29/23 1939 09/30/23 0321  WBC 7.8 9.7 6.7  HGB 8.3* 8.8* 8.2*  HCT 26.5* 27.7* 26.9*  PLT 193 216 190   BMET Recent Labs    09/29/23 0316 09/29/23 1939 09/30/23 0321  NA 133* 133* 135  K 4.1 4.1 4.1  CL 101 101 102  CO2 23 21* 21*  GLUCOSE 120* 107* 119*  BUN 16 15 16   CREATININE 0.75 0.68 0.74  CALCIUM  7.8* 8.0* 7.9*   LFT Recent Labs    09/28/23 0237 09/29/23 0316 09/30/23 0321  PROT 6.4*   < > 6.7  ALBUMIN  1.9*   < > 1.9*  AST 166*   < > 97*  ALT 52*   < > 33  ALKPHOS 1,118*   < > 911*  BILITOT 3.9*   < > 2.7*  BILIDIR 2.7*  --   --   IBILI 1.2*  --   --    < > = values in this interval not displayed.   Studies/Results: DG CHEST PORT 1  VIEW Result Date: 09/29/2023 CLINICAL DATA:  Respiratory distress EXAM: PORTABLE CHEST 1 VIEW COMPARISON:  09/28/2023 FINDINGS: Low lung volumes. Severe diffuse bilateral airspace disease, unchanged. Heart and mediastinal contours stable. IMPRESSION: Low lung volumes with severe diffuse bilateral airspace disease, stable. Electronically Signed   By: Franky Crease M.D.   On: 09/29/2023 18:07   Medications: I have reviewed the patient's current medications. Prior to Admission:  No medications prior to admission.   Scheduled:  Chlorhexidine  Gluconate Cloth  6 each Topical Q2200   cloNIDine   0.1 mg Oral TID   Followed by   NOREEN ON 10/02/2023] cloNIDine   0.1 mg Oral BH-qamhs   Followed by   NOREEN ON 10/04/2023] cloNIDine   0.1 mg Oral Daily   furosemide   40 mg Intravenous Q12H   influenza vac split trivalent PF  0.5 mL Intramuscular Tomorrow-1000   Continuous:  cefTRIAXone  (ROCEPHIN )  IV Stopped (09/30/23 1258)   dexmedetomidine  (PRECEDEX ) IV infusion 1.1 mcg/kg/hr (09/30/23 1512)   PRN:acetaminophen , albuterol , dicyclomine , HYDROmorphone  (DILAUDID ) injection, ketorolac , loperamide , LORazepam , methocarbamol , nicotine , ondansetron , mouth rinse, oxyCODONE , oxymetazoline , prochlorperazine , sodium chloride  flush  Assessment/Plan: 1) Markedly increased alkaline phosphatase-improving slowly-probably multifactorial worsened by sepsis. 2) Cirrhosis with anasarca in the setting of Hepatitis C, morbid obesity, hepatic steatosis. 3) Acute hypoxic respiratory failure on high flow nasal oxygen.  4) Opioid withdrawal with agitation.  LOS: 4 days   Renaye Sous 09/30/2023, 3:11 PM

## 2023-10-01 DIAGNOSIS — R451 Restlessness and agitation: Secondary | ICD-10-CM | POA: Diagnosis not present

## 2023-10-01 DIAGNOSIS — J9601 Acute respiratory failure with hypoxia: Secondary | ICD-10-CM | POA: Diagnosis not present

## 2023-10-01 DIAGNOSIS — R7401 Elevation of levels of liver transaminase levels: Secondary | ICD-10-CM | POA: Diagnosis not present

## 2023-10-01 DIAGNOSIS — A419 Sepsis, unspecified organism: Secondary | ICD-10-CM | POA: Diagnosis not present

## 2023-10-01 LAB — CULTURE, BLOOD (ROUTINE X 2): Culture: NO GROWTH

## 2023-10-01 LAB — C-REACTIVE PROTEIN: CRP: 24.3 mg/dL — ABNORMAL HIGH (ref <1.0)

## 2023-10-01 MED ORDER — SALINE SPRAY 0.65 % NA SOLN
1.0000 | NASAL | Status: DC | PRN
  Administered 2023-10-01 – 2023-10-05 (×2): 1 via NASAL
  Filled 2023-10-01 (×2): qty 44

## 2023-10-01 MED ORDER — MELATONIN 5 MG PO TABS
5.0000 mg | ORAL_TABLET | Freq: Every evening | ORAL | Status: DC | PRN
  Administered 2023-10-02: 5 mg via ORAL
  Filled 2023-10-01 (×2): qty 1

## 2023-10-01 NOTE — Progress Notes (Signed)
 NAME:  [name] [name], MRN:  [id number], DOB:  [birth year], LOS: 5 ADMISSION DATE:  09/25/2023, CONSULTATION DATE:  10/01/23 REFERRING MD:  Jillian, CHIEF COMPLAINT:  withdrawal    History of Present Illness:  43 y.o. F with PMH significant for polysubstance abuse, anxiety, depression, septic emboli, necrotizing fascitis, spinal abscess who was admitted 9/6 with fever and L leg swelling (site of prior surgery) along with RUQ abdominal pain.  No n/v/d, or  fevers.  She was admitted and started on ceftriaxone  and flagyl .  CT abd/pelvis with with non-specific edema and splenomegaly.  CT LLE without evidence of osteomyelitis. CT chest consistent with multifocal PNA and RUQ US  showed concern for cholecystitis, HIDA normal.  ID following, initial HIV and HCV  positive, ID consulted and confirmatory labs pending.  Over her hospital course she developed worsening signs of withdrawal with increasing tachypnea and required HFNC so was transferred to step-down and PCCM consulted.   Pertinent  Medical History    has a past medical history of Alcohol  use disorder (01/04/2012), Anxiety, Chronic back pain, Cocaine abuse (HCC) (07/06/2013), Degenerative disc disease, Depression (05/22/2015), Endometriosis, GERD (gastroesophageal reflux disease), Migraine, Septic embolism (HCC), Spinal epidural abscess (10/21/2018), and Urinary retention.   Significant Hospital Events: Including procedures, antibiotic start and stop dates in addition to other pertinent events   9/9 PCCM consult, tachycardic, tachypneic and AMS.  Started on precedex   Interim History / Subjective:   Remains on Precedex  and high flow nasal cannula  Objective    Blood pressure (!) 146/105, pulse 93, temperature 98.7 F (37.1 C), temperature source Oral, resp. rate (!) 43, height 5' 5 (1.651 m), weight 97.5 kg, last menstrual period 08/10/2011, SpO2 100%.        Intake/Output Summary (Last 24 hours) at 10/01/2023 0904 Last data filed at  10/01/2023 0600 Gross per 24 hour  Intake 1508.19 ml  Output 2150 ml  Net -641.81 ml   Filed Weights   09/30/23 2000 10/01/23 0034 10/01/23 0423  Weight: 96.6 kg 96.6 kg 97.5 kg    Gen:      No acute distress, chronically ill HEENT:  EOMI, sclera anicteric Neck:     No masses; no thyromegaly Lungs:    Clear to auscultation bilaterally; normal respiratory effort CV:         Regular rate and rhythm; no murmurs Abd:      + bowel sounds; soft, non-tender; no palpable masses, no distension Ext:    Right lower extremity venous stasis, postoperative changes in the left Neuro: Somnolent, arousable  Lab/imaging reviewed Significant for BUN/creatinine 16/0.74 Alk phos 911, AST 97, ALT 33, total bilirubin 2.7 WBC 6.7, hemoglobin 8.2, platelets 190 No new imaging  Assessment and Plan  Acute Hypoxic Respiratory Failure, ? Developing ARDS Multifocal Pneumonia Metabolic Encephalopathy History of Polysubstance abuse with withdrawal -continue HFNC, not a candidate for bipap with current mental status -continue lasix  as able, goal continue net neg balance -ID following, continue ceftriaxone  -continue Precedex , wean as able - Clonidine  taper -continue Ativan  PRN  RLE cellulitis  -continue abx per ID -retained staples from old surgery, per notes Dr. Harden contacted  Elevated Transaminates - -NM biliary scan WNL, LFTs gradually improving Possible HCV, HIV  Positive CMV  -continue supportive care - GI is on board  Critical care time:   The patient is critically ill with multiple organ system failure and requires high complexity decision making for assessment and support, frequent evaluation and titration of therapies, advanced monitoring, review of radiographic  studies and interpretation of complex data.   Critical Care Time devoted to patient care services, exclusive of separately billable procedures, described in this note is 35 minutes.   Fusako Tanabe MD [facility] See Amion for pager  If no response to pager , please call [phone] until 7pm After 7:00 pm call Elink  [phone] 10/01/2023, 9:14 AM

## 2023-10-01 NOTE — Progress Notes (Signed)
   10/01/23 1015  Spiritual Encounters  Type of Visit Initial  Care provided to: Patient  Reason for visit Urgent spiritual support  OnCall Visit No   Chaplain met with patient, [name] [name], who welcomed me into her space. We practiced mindful breathing as she settled herself and began to share her story. [name] [name] spoke of multiple losses and trauma that are particularly close to her and influence her life story.  She is expecting her sons to come visit her in the next day or so and is looking forward to their visit. Up to now she has shielded them from coming.  [name] [name] is grateful for the care she has receive during her stay   I provided a space for [name] [name] to share her story and her joy. I offered compassionate support and active listening as she opened up her life to me.  As I departed I wished [name] [name] a restful afternoon and a good visit with her sons  [employer]/support/administrative officer Chaplain  [facility]  [phone]

## 2023-10-01 NOTE — Progress Notes (Signed)
 Patient very anxious, currently on 1.1 mcg(30 ML) of precedex , 1 mg of po ativan  given, patient sitting up at side of bed, patient also stated she also takes 3 oxycodone 's when she is at home, no prescription noted for medication.

## 2023-10-01 NOTE — Progress Notes (Signed)
 Patient continues to require IV dilaudid  1 mg for pain she rates 7/10, patient received several doses throughout this and the previous shift and continues to have no relief per patient, other PRN medications are also being used for pain control with no relief given.

## 2023-10-01 NOTE — Progress Notes (Addendum)
 Patient continues to have pain, when ask what she does at home, patient stated that she takes IV fentanyl , patient PRN med list reviewed with patient again.  PRN IV Toradol  given and patient made aware that Toradol  will automatically STOPS after 5 Days per [facility] policy which would be Friday 10/02/2023. Patient encouraged to speak with primary team about her pain management, patient stated they don't wake her up so she could talk with them, patient made aware that this nurse would leave a progress note in chart for team to read, patient's response was thank you and you'll be back at 0330 with my other pain medication.

## 2023-10-01 NOTE — Progress Notes (Signed)
 Subjective: Complaining about pain over night.    Objective: Vital signs in last 24 hours: Temp:  [98.2 F (36.8 C)-98.8 F (37.1 C)] 98.3 F (36.8 C) (09/11 1200) Pulse Rate:  [72-114] 114 (09/11 1300) Resp:  [22-48] 42 (09/11 1300) BP: (124-171)/(79-143) 171/143 (09/11 1300) SpO2:  [88 %-100 %] 88 % (09/11 1300) Weight:  [96.6 kg-97.5 kg] 97.5 kg (09/11 0423) Last BM Date : 09/29/23  Intake/Output from previous day: 09/10 0701 - 09/11 0700 In: 1508.2 [P.O.:714; I.V.:694.2; IV Piggyback:100] Out: 2150 [Urine:2150] Intake/Output this shift: Total I/O In: 169.7 [I.V.:169.7] Out: 1200 [Urine:1200]  General appearance: no distress and soundly sleeping Resp: clear to auscultation bilaterally Cardio: regular rate and rhythm GI: soft, non-tender; bowel sounds normal; no masses,  no organomegaly Extremities: extremities normal, atraumatic, no cyanosis or edema  Lab Results: Recent Labs    09/29/23 0316 09/29/23 1939 09/30/23 0321  WBC 7.8 9.7 6.7  HGB 8.3* 8.8* 8.2*  HCT 26.5* 27.7* 26.9*  PLT 193 216 190   BMET Recent Labs    09/29/23 0316 09/29/23 1939 09/30/23 0321  NA 133* 133* 135  K 4.1 4.1 4.1  CL 101 101 102  CO2 23 21* 21*  GLUCOSE 120* 107* 119*  BUN 16 15 16   CREATININE 0.75 0.68 0.74  CALCIUM  7.8* 8.0* 7.9*   LFT Recent Labs    09/30/23 0321  PROT 6.7  ALBUMIN  1.9*  AST 97*  ALT 33  ALKPHOS 911*  BILITOT 2.7*   PT/INR No results for input(s): LABPROT, INR in the last 72 hours. Hepatitis Panel No results for input(s): HEPBSAG, HCVAB, HEPAIGM, HEPBIGM in the last 72 hours. C-Diff No results for input(s): CDIFFTOX in the last 72 hours. Fecal Lactopherrin No results for input(s): FECLLACTOFRN in the last 72 hours.  Studies/Results: DG CHEST PORT 1 VIEW Result Date: 09/29/2023 CLINICAL DATA:  Respiratory distress EXAM: PORTABLE CHEST 1 VIEW COMPARISON:  09/28/2023 FINDINGS: Low lung volumes. Severe diffuse bilateral  airspace disease, unchanged. Heart and mediastinal contours stable. IMPRESSION: Low lung volumes with severe diffuse bilateral airspace disease, stable. Electronically Signed   By: Franky Crease M.D.   On: 09/29/2023 18:07    Medications: Scheduled:  Chlorhexidine  Gluconate Cloth  6 each Topical Q2200   cloNIDine   0.1 mg Oral TID   Followed by   NOREEN ON 10/02/2023] cloNIDine   0.1 mg Oral BH-qamhs   Followed by   NOREEN ON 10/04/2023] cloNIDine   0.1 mg Oral Daily   furosemide   40 mg Intravenous Q12H   influenza vac split trivalent PF  0.5 mL Intramuscular Tomorrow-1000   Continuous:  cefTRIAXone  (ROCEPHIN )  IV 2 g (10/01/23 1243)   dexmedetomidine  (PRECEDEX ) IV infusion 0.7 mcg/kg/hr (10/01/23 1256)    Assessment/Plan: 1) Elevated AP. 2) Hepatic steatosis. 3) Cellulitis.   From the hepatic standpoint she is improving and stable.  Again, it is felt that the liver enzyme elevation is multifactorial.  Her TB is also decreasing.  Plan: 1) Maintain supportive care. 2) Signing off.  LOS: 5 days   Jorgeluis Gurganus D 10/01/2023, 1:37 PM

## 2023-10-02 DIAGNOSIS — G9341 Metabolic encephalopathy: Secondary | ICD-10-CM | POA: Diagnosis not present

## 2023-10-02 DIAGNOSIS — J189 Pneumonia, unspecified organism: Secondary | ICD-10-CM | POA: Diagnosis not present

## 2023-10-02 DIAGNOSIS — J80 Acute respiratory distress syndrome: Secondary | ICD-10-CM

## 2023-10-02 LAB — COMPREHENSIVE METABOLIC PANEL WITH GFR
ALT: 29 U/L (ref 0–44)
AST: 133 U/L — ABNORMAL HIGH (ref 15–41)
Albumin: 2.3 g/dL — ABNORMAL LOW (ref 3.5–5.0)
Alkaline Phosphatase: 1031 U/L — ABNORMAL HIGH (ref 38–126)
Anion gap: 10 (ref 5–15)
BUN: 13 mg/dL (ref 6–20)
CO2: 26 mmol/L (ref 22–32)
Calcium: 7.9 mg/dL — ABNORMAL LOW (ref 8.9–10.3)
Chloride: 99 mmol/L (ref 98–111)
Creatinine, Ser: 0.66 mg/dL (ref 0.44–1.00)
GFR, Estimated: >60 mL/min (ref >60)
Glucose, Bld: 120 mg/dL — ABNORMAL HIGH (ref 70–99)
Potassium: 3.3 mmol/L — ABNORMAL LOW (ref 3.5–5.1)
Sodium: 136 mmol/L (ref 135–145)
Total Bilirubin: 1.5 mg/dL — ABNORMAL HIGH (ref 0.0–1.2)
Total Protein: 7.2 g/dL (ref 6.5–8.1)

## 2023-10-02 LAB — CBC
HCT: 25.4 % — ABNORMAL LOW (ref 36.0–46.0)
Hemoglobin: 7.8 g/dL — ABNORMAL LOW (ref 12.0–15.0)
MCH: 25.5 pg — ABNORMAL LOW (ref 26.0–34.0)
MCHC: 30.7 g/dL (ref 30.0–36.0)
MCV: 83 fL (ref 80.0–100.0)
Platelets: 253 K/uL (ref 150–400)
RBC: 3.06 MIL/uL — ABNORMAL LOW (ref 3.87–5.11)
RDW: 20 % — ABNORMAL HIGH (ref 11.5–15.5)
WBC: 9.6 K/uL (ref 4.0–10.5)
nRBC: 0.3 % — ABNORMAL HIGH (ref 0.0–0.2)

## 2023-10-02 LAB — CULTURE, BLOOD (ROUTINE X 2)
Culture: NO GROWTH
Culture: NO GROWTH

## 2023-10-02 MED ORDER — FENTANYL CITRATE PF 50 MCG/ML IJ SOSY
25.0000 ug | PREFILLED_SYRINGE | INTRAMUSCULAR | Status: DC | PRN
  Administered 2023-10-02 – 2023-10-03 (×13): 25 ug via INTRAVENOUS
  Filled 2023-10-02 (×13): qty 1

## 2023-10-02 MED ORDER — TEMAZEPAM 15 MG PO CAPS
15.0000 mg | ORAL_CAPSULE | Freq: Once | ORAL | Status: AC
  Administered 2023-10-02: 15 mg via ORAL
  Filled 2023-10-02: qty 1

## 2023-10-02 MED ORDER — CEFADROXIL 500 MG PO CAPS
500.0000 mg | ORAL_CAPSULE | Freq: Two times a day (BID) | ORAL | Status: DC
  Administered 2023-10-03 – 2023-10-06 (×7): 500 mg via ORAL
  Filled 2023-10-02 (×8): qty 1

## 2023-10-02 NOTE — Progress Notes (Signed)
 I was paged to see [name] [name] to provide support around anxiety.  I provided a safe space for her to share about some of the things contributing to her anxiety, most specifically is that her daughter died at [facility] in 11-03-2019.  This was traumatic and contributed to her anxiety about doctors and hospitals such that she has delayed treatment at times.  She has two sons whom she hopes will come to see her. I provided listening, facilitated reflection and encouraged her to continue caring for herself through this difficult time.

## 2023-10-02 NOTE — Plan of Care (Signed)

## 2023-10-02 NOTE — Progress Notes (Addendum)
 Wilbur refused to wear her oxygen because she wanted to not go on anymore. I want to be with my daughter (her daughter is deceased). The patient was not receptive to therapeutic communication. When her O2 level dropped to 80%, [facility] was contacted and Dr. Epimenio virtually entered the room. Pt expressed the same sentiments to Dr. Epimenio with the added condition that she would put the oxygen on if he would give her something to make her sleep through the night that was not melatonin, to which Dr. Epimenio agreed. He ordered 15 mg temazepam .  Vung has been and continues to be manipulative and this episode was no different. Cherell deserves to be cared for while she is admitted to the hospital, however, her manipulation is selfishly funneling valuable resources away from other patients.  This nurse will continue to give this patient great care while setting reasonable boundaries.

## 2023-10-02 NOTE — Progress Notes (Signed)
 I spent time with [name] [name] to offer support around on-going anxiety with being in the hospital.  She is grateful that she has been able to get some sleep.  She became drowsy from the medicine soon after I arrived but asked me to sit with her for a few minutes, which I gladly did.

## 2023-10-02 NOTE — Progress Notes (Addendum)
   NAME:  [name] [name], MRN:  [id number], DOB:  [birth year], LOS: 6 ADMISSION DATE:  09/25/2023, CONSULTATION DATE:  10/02/23 REFERRING MD:  Jillian, CHIEF COMPLAINT:  withdrawal    History of Present Illness:  43 y.o. F with PMH significant for polysubstance abuse, anxiety, depression, septic emboli, necrotizing fascitis, spinal abscess who was admitted 9/6 with fever and L leg swelling (site of prior surgery) along with RUQ abdominal pain.  No n/v/d, or  fevers.  She was admitted and started on ceftriaxone  and flagyl .  CT abd/pelvis with with non-specific edema and splenomegaly.  CT LLE without evidence of osteomyelitis. CT chest consistent with multifocal PNA and RUQ US  showed concern for cholecystitis, HIDA normal.  ID following, initial HIV and HCV  positive, ID consulted and confirmatory labs pending.  Over her hospital course she developed worsening signs of withdrawal with increasing tachypnea and required HFNC so was transferred to step-down and PCCM consulted.   Pertinent  Medical History    has a past medical history of Alcohol  use disorder (01/04/2012), Anxiety, Chronic back pain, Cocaine abuse (HCC) (07/06/2013), Degenerative disc disease, Depression (05/22/2015), Endometriosis, GERD (gastroesophageal reflux disease), Migraine, Septic embolism (HCC), Spinal epidural abscess (10/21/2018), and Urinary retention.   Significant Hospital Events: Including procedures, antibiotic start and stop dates in addition to other pertinent events   9/9 PCCM consult, tachycardic, tachypneic and AMS.  Started on precedex  9/11 off pressors  Interim History / Subjective:   Remains off Precedex   Objective    Blood pressure (!) 165/88, pulse (!) 104, temperature (!) 100.5 F (38.1 C), temperature source Oral, resp. rate 17, height 5' 5 (1.651 m), weight 97.5 kg, last menstrual period 08/10/2011, SpO2 100%.        Intake/Output Summary (Last 24 hours) at 10/02/2023 0747 Last data filed at 10/02/2023  0345 Gross per 24 hour  Intake 351.93 ml  Output 1875 ml  Net -1523.07 ml   Filed Weights   09/30/23 2000 10/01/23 0034 10/01/23 0423  Weight: 96.6 kg 96.6 kg 97.5 kg   Gen:      No acute distress, chronically ill appearing HEENT:  EOMI, sclera anicteric Neck:     No masses; no thyromegaly Lungs:    Clear to auscultation bilaterally; normal respiratory effort CV:         Regular rate and rhythm; no murmurs Abd:      + bowel sounds; soft, non-tender; no palpable masses, no distension Ext:    Right lower extremity venous stasis, postoperative changes in the left Neuro: Somnolent  Lab/imaging reviewed No new labs or imaging  Assessment and Plan  Acute Hypoxic Respiratory Failure, mild ARDS Multifocal Pneumonia Metabolic Encephalopathy History of Polysubstance abuse with withdrawal - Wean down oxygen as tolerated - Continue lasix  as able, goal continue net neg balance -ID following, continue ceftriaxone  - Clonidine  taper -continue Ativan  PRN  RLE cellulitis  -continue abx per ID -retained staples from old surgery, per notes Dr. Harden contacted  Elevated Transaminates - -NM biliary scan WNL, LFTs gradually improving Possible HCV, HIV  Positive CMV  -continue supportive care - GI is on board  Stable for transfer out of ICU and to hospitalist service  Critical care time: NA   Lonna Coder MD [facility] See Amion for pager  If no response to pager , please call [phone] until 7pm After 7:00 pm call Elink  [phone] 10/02/2023, 7:50 AM

## 2023-10-02 NOTE — Progress Notes (Signed)
 eLink Physician-Brief Progress Note Patient Name: [name] [name] DOB: 1981/01/04 MRN: [id number]   Date of Service  10/02/2023  HPI/Events of Note  Patient requesting something for insomnia.  eICU Interventions  Restoril  15 mg po x 1 ordered.        Que Meneely U Eathon Valade 10/02/2023, 10:58 PM

## 2023-10-03 DIAGNOSIS — R7989 Other specified abnormal findings of blood chemistry: Secondary | ICD-10-CM | POA: Diagnosis not present

## 2023-10-03 DIAGNOSIS — L03115 Cellulitis of right lower limb: Secondary | ICD-10-CM

## 2023-10-03 DIAGNOSIS — J189 Pneumonia, unspecified organism: Secondary | ICD-10-CM

## 2023-10-03 DIAGNOSIS — R799 Abnormal finding of blood chemistry, unspecified: Secondary | ICD-10-CM

## 2023-10-03 DIAGNOSIS — L03116 Cellulitis of left lower limb: Secondary | ICD-10-CM

## 2023-10-03 DIAGNOSIS — B999 Unspecified infectious disease: Secondary | ICD-10-CM

## 2023-10-03 DIAGNOSIS — Z765 Malingerer [conscious simulation]: Secondary | ICD-10-CM

## 2023-10-03 DIAGNOSIS — J9601 Acute respiratory failure with hypoxia: Secondary | ICD-10-CM | POA: Diagnosis not present

## 2023-10-03 LAB — COMPREHENSIVE METABOLIC PANEL WITH GFR
ALT: 31 U/L (ref 0–44)
AST: 117 U/L — ABNORMAL HIGH (ref 15–41)
Albumin: 2.5 g/dL — ABNORMAL LOW (ref 3.5–5.0)
Alkaline Phosphatase: 867 U/L — ABNORMAL HIGH (ref 38–126)
Anion gap: 10 (ref 5–15)
BUN: 6 mg/dL (ref 6–20)
CO2: 26 mmol/L (ref 22–32)
Calcium: 8.2 mg/dL — ABNORMAL LOW (ref 8.9–10.3)
Chloride: 99 mmol/L (ref 98–111)
Creatinine, Ser: 0.5 mg/dL (ref 0.44–1.00)
GFR, Estimated: >60 mL/min (ref >60)
Glucose, Bld: 119 mg/dL — ABNORMAL HIGH (ref 70–99)
Potassium: 3.5 mmol/L (ref 3.5–5.1)
Sodium: 135 mmol/L (ref 135–145)
Total Bilirubin: 1.6 mg/dL — ABNORMAL HIGH (ref 0.0–1.2)
Total Protein: 7.2 g/dL (ref 6.5–8.1)

## 2023-10-03 LAB — CBC
HCT: 25.7 % — ABNORMAL LOW (ref 36.0–46.0)
Hemoglobin: 7.6 g/dL — ABNORMAL LOW (ref 12.0–15.0)
MCH: 24.7 pg — ABNORMAL LOW (ref 26.0–34.0)
MCHC: 29.6 g/dL — ABNORMAL LOW (ref 30.0–36.0)
MCV: 83.4 fL (ref 80.0–100.0)
Platelets: 262 K/uL (ref 150–400)
RBC: 3.08 MIL/uL — ABNORMAL LOW (ref 3.87–5.11)
RDW: 19.7 % — ABNORMAL HIGH (ref 11.5–15.5)
WBC: 10.3 K/uL (ref 4.0–10.5)
nRBC: 0.4 % — ABNORMAL HIGH (ref 0.0–0.2)

## 2023-10-03 MED ORDER — ACETAMINOPHEN 325 MG PO TABS
650.0000 mg | ORAL_TABLET | ORAL | Status: DC | PRN
  Administered 2023-10-03 – 2023-10-06 (×3): 650 mg via ORAL
  Filled 2023-10-03 (×3): qty 2

## 2023-10-03 MED ORDER — OXYCODONE HCL 5 MG PO TABS
5.0000 mg | ORAL_TABLET | Freq: Four times a day (QID) | ORAL | Status: DC | PRN
  Administered 2023-10-03: 5 mg via ORAL
  Filled 2023-10-03: qty 1

## 2023-10-03 MED ORDER — OXYCODONE HCL 5 MG PO TABS
5.0000 mg | ORAL_TABLET | ORAL | Status: DC | PRN
  Administered 2023-10-03 – 2023-10-06 (×12): 5 mg via ORAL
  Filled 2023-10-03 (×12): qty 1

## 2023-10-03 MED ORDER — TEMAZEPAM 15 MG PO CAPS
15.0000 mg | ORAL_CAPSULE | Freq: Once | ORAL | Status: AC
  Administered 2023-10-03: 15 mg via ORAL
  Filled 2023-10-03: qty 1

## 2023-10-03 NOTE — Assessment & Plan Note (Signed)
-   multiple other workup performed given complexity of admission is summarized below: - HCV acute ruled out; HCV RNA is negative. 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  Deferred to ID -CMV ruled out, DNA quantitative negative -GC chlamydia negative -EBV DNA quantitative PCR positive; appears she may have positive infection.  Supportive care regardless

## 2023-10-03 NOTE — Assessment & Plan Note (Signed)
-   Patient endorses to using fentanyl  on the street -UDS positive for amphetamine and opiates on admission - treated with precedex  in ICU and completed withdrawal; any further requests for IV opioids is behavior consistent with dependence and not withdrawal

## 2023-10-03 NOTE — Assessment & Plan Note (Addendum)
-   treated with vanc/cefepime /flagyl  briefly and now on cefadroxil  to complete course - 2-week course recommended total per ID

## 2023-10-03 NOTE — Assessment & Plan Note (Signed)
-   Hepatic steatosis noted on CT imaging -LFTs significantly elevated on admission which have been downtrending; etiology possibly in setting of cholestasis of sepsis; no mention of biliary dilatation on imaging

## 2023-10-03 NOTE — Assessment & Plan Note (Signed)
-   CT angio chest negative for PE; shows diffuse patchy and interstitial opacities throughout both lungs concerning for multifocal pneumonia -Has completed antibiotic course and been followed by ID as well - Remains on cefadroxil  for cellulitis treatment

## 2023-10-03 NOTE — Assessment & Plan Note (Signed)
-   RUQ ultrasound was equivocal for acute cholecystitis; HIDA scan performed on 09/27/2023 and was normal

## 2023-10-03 NOTE — Assessment & Plan Note (Signed)
-   Suspect poor nutrition in setting of illicit drug use ongoing outpatient and poor personal care -Continue regular diet

## 2023-10-03 NOTE — Progress Notes (Signed)
 Patient ambulated in the hall with a walker and desaturated to 81 percent on room air. She required 6 liters while walking to get her above 92 percent. While resting patient tolerating 4 liters.

## 2023-10-03 NOTE — Assessment & Plan Note (Addendum)
-   due to PNA, see separate problem - Oxygenation has improved and ambulating well on 4 L with saturation is 92-93% - Stable for going home with oxygen and further weaning; have encouraged her to continue cessation of smoking for now at least as well

## 2023-10-03 NOTE — Assessment & Plan Note (Addendum)
-   Blood culture from 09/25/2023 noted with staph epi (only 1 set drawn, 2/2 positive) -repeat culture on 09/27/2023 remaining negative x 5 days -Cultures deemed likely contaminant after evaluation with ID previously also

## 2023-10-03 NOTE — Plan of Care (Signed)

## 2023-10-03 NOTE — Progress Notes (Addendum)
--  15 mg temazepam  given 9/12 @ 2325 --Pt awake 9/13 @ 0030 asking for fentanyl  for pain (given @ 0036) --pt again refused to wear her oxygen stating that she wanted to go be with her daughter. --Nya Ward, RN and this nurse attempted to reason with the patient and further educate her regarding her current condition and the treatment she needs. The patient kept refusing her oxygen while requesting to speak with the doctor to request more medication. This nurse requested assistance from the charge nurse to ensure thorough communication. Cathlyn Battiest, RN (charge) communicated to the patient that no further meds would be acceptable to give the patient if she did not have the O2 levels to justify administration. The patient then chose to wear her oxygen if she was allowed to speak with Dr. Epimenio again.  --This nurse's second-hand account is that the patient was able to speak with Dr. Epimenio, who ordered one additional dose of 15 mg temazepam . --This nurse entered the patient's room upon her calling out asking if the med had yet been ordered. Pt asked why the nurses were not putting her on suicide watch to have someone sit with her permanently, since she had been expressing her want to go be with my daughter. Abridgement: this nurse expressed the seriousness of the situation and she could choose to be on suicide watch or receive the second dose of temazepam . Pt chose to accept the med and try to get some sleep. --15 mg temazepam  given 9/13 @ 0209 --this nurse entered the pts room at 0400 in response to the patient moaning and complaining of being too hot and nauseous. This nurse witnessed white froth dripping down the R corner of her mouth. Pt having tremors

## 2023-10-03 NOTE — Progress Notes (Addendum)
 Progress Note    [name] [name]   FMW:[id number]  DOB: March 04, 1980  DOA: 09/25/2023     7 PCP: Pcp, No  Initial CC: Abdominal pain  Hospital Course: Ms. [name] [name] is a 43 yo female with PMH polysubstance abuse, depression/anxiety, septic emboli, necrotizing fasciitis of leg, spinal abscess. She originally presented with complaints of RUQ abdominal pain.  She underwent workup for infection and ultimately CT chest showed multifocal pneumonia and RUQ showed concern for cholecystitis however HIDA scan was normal. She also underwent consultation with ID during hospitalization. She was treated for opioid withdrawal with Precedex  drip in the ICU, required pressor support, and maintained on high flow oxygen for respiratory failure. UDS on admission was positive for amphetamines and opiates.  She endorsed use of fentanyl  routinely prior to hospitalization.  Interval History:  Patient continuing to have manipulative behavior.  Difficulty with sleep noted overnight and needed extra Restoril . This morning asking for fentanyl  to remain on despite explanation regarding de-escalation of opioids given no further indication.  She quickly began looking for a ride home.  Unfortunately still hypoxic requiring 6 L ambulating.   Assessment and Plan: * Acute respiratory failure with hypoxia (HCC) - due to PNA, see separate problem - remains in 6L O2 when ambulating but desats at rest still - continue weaning as able - daily walk test   CAP (community acquired pneumonia) - CT angio chest negative for PE; shows diffuse patchy and interstitial opacities throughout both lungs concerning for multifocal pneumonia -Has completed antibiotic course and been followed by ID as well - Remains on cefadroxil  for cellulitis treatment  Cellulitis of both lower extremities - treated with vanc/cefepime /flagyl  briefly and now on cefadroxil  to complete course - 2-week course recommended total per ID  Substance abuse (HCC) -  Patient endorses to using fentanyl  on the street -UDS positive for amphetamine and opiates on admission - treated with precedex  in ICU and completed withdrawal; any further requests for IV opioids is behavior consistent with dependence and not withdrawal  Drug-seeking behavior - Patient very manipulative with staff throughout hospitalization - Medical care will continue appropriately and treating medical issues as deemed necessary and appropriately; escalation of opioids only as deemed medically necessary -Fentanyl  discontinued; she is able to have oxycodone  for pain which is deemed appropriate management at this time  Infectious process - multiple other workup performed given complexity of admission is summarized below: - HCV acute ruled out; HCV RNA is negative. 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  Deferred to ID -CMV ruled out, DNA quantitative negative -GC chlamydia negative -EBV DNA quantitative PCR positive; appears she may have positive infection.  Supportive care regardless  Elevated LFTs - Hepatic steatosis noted on CT imaging -LFTs significantly elevated on admission which have been downtrending; etiology possibly in setting of cholestasis of sepsis; no mention of biliary dilatation on imaging  Protein-calorie malnutrition, severe (HCC) - Suspect poor nutrition in setting of illicit drug use ongoing outpatient and poor personal care -Continue regular diet  Normocytic anemia - No bleeding appreciated -Likely bone marrow suppression from acute illness -Continue trending CBC  Contamination of blood culture-resolved as of 10/03/2023 - Blood culture from 09/25/2023 noted with staph epi (only 1 set drawn, 2/2 positive) -repeat culture on 09/27/2023 remaining negative x 5 days -Cultures  deemed likely contaminant after evaluation with ID previously  also  Thrombocytopenia (HCC)-resolved as of 10/03/2023 - likely from acute illness - now improved   Hyponatremia-resolved as of 10/03/2023 - improved s/p IVF  RUQ pain-resolved as of 10/03/2023 - RUQ ultrasound was equivocal for acute cholecystitis; HIDA scan performed on 09/27/2023 and was normal   Old records reviewed in assessment of this patient  Antimicrobials: Cefepime  9/6 x 1 Rocephin  9/6 >> 9/12 Flagyl  9/6 >> 9/10 Vanc 9/6 >9/8 Cefadroxil  9/13 >> current   DVT prophylaxis:  SCDs Start: 09/26/23 0905   Code Status:   Code Status: Full Code  Mobility Assessment (Last 72 Hours)     Mobility Assessment     Row Name 10/03/23 0953 10/02/23 2015 10/02/23 1000 10/02/23 0800 10/01/23 2020   Does the patient have exclusion criteria? No - Perform mobility assessment No - Perform mobility assessment -- No - Perform mobility assessment No - Perform mobility assessment   Mobility Assessment Exclusion Criteria -- No exclusion criteria present, perform mobility assessment -- -- No exclusion criteria present, perform mobility assessment   What is the highest level of mobility based on the mobility assessment? Level 4 (Ambulates with assistance) - Balance while stepping forward/back - Complete Level 4 (Ambulates with assistance) - Balance while stepping forward/back - Complete Level 4 (Ambulates with assistance) - Balance while stepping forward/back - Complete Level 3 (Stands with assistance) - Balance while standing  and cannot march in place Level 4 (Ambulates with assistance) - Balance while stepping forward/back - Complete   Is the above level different from baseline mobility prior to current illness? -- No - Consider discontinuing PT/OT -- Yes - Recommend PT order Yes - Recommend PT order    Row Name 10/01/23 0800 09/30/23 2030         Does the patient have exclusion criteria? No - Perform mobility assessment No - Perform  mobility assessment      Mobility Assessment Exclusion Criteria No exclusion criteria present, perform mobility assessment No exclusion criteria present, perform mobility assessment      What is the highest level of mobility based on the mobility assessment? Level 3 (Stands with assistance) - Balance while standing  and cannot march in place Level 3 (Stands with assistance) - Balance while standing  and cannot march in place      Is the above level different from baseline mobility prior to current illness? Yes - Recommend PT order Yes - Recommend PT order         Barriers to discharge: none Disposition Plan:  Home HH orders placed: n/a Status is: Inpt  Objective: Blood pressure 138/87, pulse (!) 118, temperature 98.5 F (36.9 C), temperature source Axillary, resp. rate (!) 27, height 5' 5 (1.651 m), weight 97.5 kg, last menstrual period 08/10/2011, SpO2 99%.  Examination:  Physical Exam Constitutional:      Comments: Extremely disheveled and unkempt appearing adult woman laying in bed in no actual distress.  Appears somewhat anxious  HENT:     Head: Normocephalic and atraumatic.     Mouth/Throat:     Mouth: Mucous membranes are moist.  Eyes:     Extraocular Movements: Extraocular movements intact.  Cardiovascular:     Rate and Rhythm: Normal rate and regular rhythm.  Pulmonary:     Effort: Pulmonary effort is normal. No respiratory distress.     Breath sounds: Normal breath sounds. No wheezing.  Abdominal:     General: Bowel sounds are normal. There is no distension.     Palpations: Abdomen is soft.  Tenderness: There is no abdominal tenderness.  Musculoskeletal:        General: Normal range of motion.     Cervical back: Normal range of motion and neck supple.     Right lower leg: Edema present.     Left lower leg: Edema present.  Skin:    General: Skin is warm and dry.     Comments: Scattered skin abrasions throughout; track marks noted on arms  Neurological:      General: No focal deficit present.  Psychiatric:        Mood and Affect: Mood is anxious.      Consultants:  ID  Procedures:    Data Reviewed: Results for orders placed or performed during the hospital encounter of 09/25/23 (from the past 24 hours)  CBC     Status: Abnormal   Collection Time: 10/03/23  4:37 AM  Result Value Ref Range   WBC 10.3 4.0 - 10.5 K/uL   RBC 3.08 (L) 3.87 - 5.11 MIL/uL   Hemoglobin 7.6 (L) 12.0 - 15.0 g/dL   HCT 74.2 (L) 63.9 - 53.9 %   MCV 83.4 80.0 - 100.0 fL   MCH 24.7 (L) 26.0 - 34.0 pg   MCHC 29.6 (L) 30.0 - 36.0 g/dL   RDW 80.2 (H) 88.4 - 84.4 %   Platelets 262 150 - 400 K/uL   nRBC 0.4 (H) 0.0 - 0.2 %  Comprehensive metabolic panel with GFR     Status: Abnormal   Collection Time: 10/03/23  4:37 AM  Result Value Ref Range   Sodium 135 135 - 145 mmol/L   Potassium 3.5 3.5 - 5.1 mmol/L   Chloride 99 98 - 111 mmol/L   CO2 26 22 - 32 mmol/L   Glucose, Bld 119 (H) 70 - 99 mg/dL   BUN 6 6 - 20 mg/dL   Creatinine, Ser 9.49 0.44 - 1.00 mg/dL   Calcium  8.2 (L) 8.9 - 10.3 mg/dL   Total Protein 7.2 6.5 - 8.1 g/dL   Albumin  2.5 (L) 3.5 - 5.0 g/dL   AST 882 (H) 15 - 41 U/L   ALT 31 0 - 44 U/L   Alkaline Phosphatase 867 (H) 38 - 126 U/L   Total Bilirubin 1.6 (H) 0.0 - 1.2 mg/dL   GFR, Estimated >39 >39 mL/min   Anion gap 10 5 - 15    I have reviewed pertinent nursing notes, vitals, labs, and images as necessary. I have ordered labwork to follow up on as indicated.  I have reviewed the last notes from staff over past 24 hours. I have discussed patient's care plan and test results with nursing staff, CM/SW, and other staff as appropriate.  Time spent: Greater than 50% of the 55 minute visit was spent in counseling/coordination of care for the patient as laid out in the A&P.   LOS: 7 days   Alm Apo, MD [facility] 10/03/2023, 2:26 PM

## 2023-10-03 NOTE — Hospital Course (Signed)
 [name] [name] is a 43 yo female with PMH polysubstance abuse, depression/anxiety, septic emboli, necrotizing fasciitis of leg, spinal abscess. She originally presented with complaints of RUQ abdominal pain.  She underwent workup for infection and ultimately CT chest showed multifocal pneumonia and RUQ showed concern for cholecystitis however HIDA scan was normal. She also underwent consultation with ID during hospitalization. She was treated for opioid withdrawal with Precedex  drip in the ICU, required pressor support, and maintained on high flow oxygen for respiratory failure. UDS on admission was positive for amphetamines and opiates.  She endorsed use of fentanyl  routinely prior to hospitalization.

## 2023-10-03 NOTE — Assessment & Plan Note (Signed)
-   likely from acute illness - now improved

## 2023-10-03 NOTE — Assessment & Plan Note (Signed)
-   No bleeding appreciated -Likely bone marrow suppression from acute illness -Continue trending CBC

## 2023-10-03 NOTE — Progress Notes (Signed)
 eLink Physician-Brief Progress Note Patient Name: [name] [name] DOB: 10-28-1980 MRN: [id number]   Date of Service  10/03/2023  HPI/Events of Note  Patient still unable to fall asleep.  eICU Interventions  An additional 15 mg dose of Restoril  given for a total of 30 mg.        [name] [name] 10/03/2023, 1:53 AM

## 2023-10-03 NOTE — Assessment & Plan Note (Signed)
-   Patient very manipulative with staff throughout hospitalization - Medical care will continue appropriately and treating medical issues as deemed necessary and appropriately; escalation of opioids only as deemed medically necessary -Fentanyl  discontinued; she is able to have oxycodone  for pain which is deemed appropriate management at this time

## 2023-10-03 NOTE — Assessment & Plan Note (Signed)
-   improved s/p IVF

## 2023-10-04 ENCOUNTER — Inpatient Hospital Stay (HOSPITAL_COMMUNITY)

## 2023-10-04 DIAGNOSIS — Z765 Malingerer [conscious simulation]: Secondary | ICD-10-CM | POA: Diagnosis not present

## 2023-10-04 DIAGNOSIS — J9601 Acute respiratory failure with hypoxia: Secondary | ICD-10-CM | POA: Diagnosis not present

## 2023-10-04 DIAGNOSIS — J189 Pneumonia, unspecified organism: Secondary | ICD-10-CM | POA: Diagnosis not present

## 2023-10-04 DIAGNOSIS — L03115 Cellulitis of right lower limb: Secondary | ICD-10-CM | POA: Diagnosis not present

## 2023-10-04 DIAGNOSIS — F411 Generalized anxiety disorder: Secondary | ICD-10-CM | POA: Diagnosis not present

## 2023-10-04 DIAGNOSIS — F329 Major depressive disorder, single episode, unspecified: Secondary | ICD-10-CM | POA: Diagnosis not present

## 2023-10-04 DIAGNOSIS — F1120 Opioid dependence, uncomplicated: Secondary | ICD-10-CM | POA: Diagnosis not present

## 2023-10-04 LAB — COMPREHENSIVE METABOLIC PANEL WITH GFR
ALT: 22 U/L (ref 0–44)
AST: 84 U/L — ABNORMAL HIGH (ref 15–41)
Albumin: 2.6 g/dL — ABNORMAL LOW (ref 3.5–5.0)
Alkaline Phosphatase: 702 U/L — ABNORMAL HIGH (ref 38–126)
Anion gap: 9 (ref 5–15)
BUN: <5 mg/dL — ABNORMAL LOW (ref 6–20)
CO2: 27 mmol/L (ref 22–32)
Calcium: 8.4 mg/dL — ABNORMAL LOW (ref 8.9–10.3)
Chloride: 102 mmol/L (ref 98–111)
Creatinine, Ser: 0.5 mg/dL (ref 0.44–1.00)
GFR, Estimated: >60 mL/min (ref >60)
Glucose, Bld: 110 mg/dL — ABNORMAL HIGH (ref 70–99)
Potassium: 3.7 mmol/L (ref 3.5–5.1)
Sodium: 137 mmol/L (ref 135–145)
Total Bilirubin: 1.3 mg/dL — ABNORMAL HIGH (ref 0.0–1.2)
Total Protein: 7.4 g/dL (ref 6.5–8.1)

## 2023-10-04 LAB — CBC WITH DIFFERENTIAL/PLATELET
Abs Immature Granulocytes: 0.63 K/uL — ABNORMAL HIGH (ref 0.00–0.07)
Basophils Absolute: 0 K/uL (ref 0.0–0.1)
Basophils Relative: 0 %
Eosinophils Absolute: 0.1 K/uL (ref 0.0–0.5)
Eosinophils Relative: 1 %
HCT: 26.1 % — ABNORMAL LOW (ref 36.0–46.0)
Hemoglobin: 7.8 g/dL — ABNORMAL LOW (ref 12.0–15.0)
Immature Granulocytes: 7 %
Lymphocytes Relative: 16 %
Lymphs Abs: 1.5 K/uL (ref 0.7–4.0)
MCH: 25.7 pg — ABNORMAL LOW (ref 26.0–34.0)
MCHC: 29.9 g/dL — ABNORMAL LOW (ref 30.0–36.0)
MCV: 85.9 fL (ref 80.0–100.0)
Monocytes Absolute: 0.6 K/uL (ref 0.1–1.0)
Monocytes Relative: 6 %
Neutro Abs: 6.8 K/uL (ref 1.7–7.7)
Neutrophils Relative %: 70 %
Platelets: 283 K/uL (ref 150–400)
RBC: 3.04 MIL/uL — ABNORMAL LOW (ref 3.87–5.11)
RDW: 19.8 % — ABNORMAL HIGH (ref 11.5–15.5)
WBC: 9.6 K/uL (ref 4.0–10.5)
nRBC: 0.4 % — ABNORMAL HIGH (ref 0.0–0.2)

## 2023-10-04 LAB — MAGNESIUM: Magnesium: 2.4 mg/dL (ref 1.7–2.4)

## 2023-10-04 MED ORDER — RAMELTEON 8 MG PO TABS
8.0000 mg | ORAL_TABLET | Freq: Every day | ORAL | Status: DC
  Administered 2023-10-04 – 2023-10-05 (×2): 8 mg via ORAL
  Filled 2023-10-04 (×4): qty 1

## 2023-10-04 MED ORDER — GABAPENTIN 100 MG PO CAPS
100.0000 mg | ORAL_CAPSULE | Freq: Three times a day (TID) | ORAL | Status: DC
  Administered 2023-10-04 – 2023-10-06 (×7): 100 mg via ORAL
  Filled 2023-10-04 (×7): qty 1

## 2023-10-04 MED ORDER — IBUPROFEN 200 MG PO TABS: 400.0000 mg | ORAL_TABLET | Freq: Four times a day (QID) | ORAL | Status: DC | PRN

## 2023-10-04 MED ORDER — DULOXETINE HCL 20 MG PO CPEP
20.0000 mg | ORAL_CAPSULE | Freq: Every day | ORAL | Status: DC
  Administered 2023-10-04 – 2023-10-05 (×2): 20 mg via ORAL
  Filled 2023-10-04 (×2): qty 1

## 2023-10-04 NOTE — Progress Notes (Signed)
 0030: patient experienced a non-witnessed fall  --Lynwood Kipper, NP notified (please see Lynwood Flatten note and post-fall flowsheets for details)  --Safety zone submitted

## 2023-10-04 NOTE — Progress Notes (Signed)
 SATURATION QUALIFICATIONS: (This note is used to comply with regulatory documentation for home oxygen)  Patient Saturations on Room Air at Rest = 70%  Patient Saturations on Room Air while Ambulating = 78%  Patient Saturations on 4 Liters of oxygen while Ambulating = 86%  Please briefly explain why patient needs home oxygen:  Saturation while on room air and ambulating dropped to 78%, went ahead and applied oxygen via [place] at 4 liters to bring saturation back up to 86%, went back up to 6L [place] and saturation went back up to 90-92% while ambulating.

## 2023-10-04 NOTE — Progress Notes (Addendum)
       Overnight   NAME: [name] [name] MRN: [id number] DOB : 1980/04/07    Date of Service   10/04/2023   HPI/Events of Note    Notified by RN for unwitnessed fall.  Nursing staff was alerted by bed alarm, and responded to find patient lying on her back, awake and oriented.  Brief history 43 year old female past medical history of polysubstance use, depression anxiety, septic emboli, necrotizing fasciitis of the leg, spinal abscess.  CT showed multifocal pneumonia with concern for cholecystitis however HIDA scan was normal. Treated for opioid withdrawal with Precedex . UDS was positive for amphetamines and opiates. She endorses use of fentanyl  routinely.  Bedside visit Described by nursing staff as sitting on the side of the bed, with bed alarm on, nursing staff was alerted by bed alarm, immediately responded to find patient lying on her back on the floor.  Patient is awake and oriented x 4. No deformities, contusions, abrasions, punctures, bruising, tears, lacerations, swelling.  Patient is administered as needed oral meds as directed in previous notes.(Attending)  No obvious distress. Patient continues to exhibit drug-seeking behavior as noted by Attending Physician.    Interventions/ Plan   CT of the head-unwitnessed fall- pending Post fall protocol Fall resting pads Continue bed alarm Bed rails       ----------------------------------------------------------------------------------------------------------------- Update 0341 hrs Imaging in part:   SOFT TISSUES AND SKULL: No acute soft tissue abnormality. No skull fracture.   IMPRESSION: 1. No acute intracranial abnormality. 2. 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 If clinically indicated, this could be better assessed with MRI examination.   Electronically signed by: Dorethia Molt MD 10/04/2023 02:03 AM EDT RP Workstation: [id number]    Lynwood Kipper BSN MSNA MSN ACNPC-AG Acute Care Nurse  Practitioner [facility]

## 2023-10-04 NOTE — Plan of Care (Signed)
  Problem: Education: Goal: Knowledge of General Education information will improve Description: Including pain rating scale, medication(s)/side effects and non-pharmacologic comfort measures Outcome: Progressing   Problem: Health Behavior/Discharge Planning: Goal: Ability to manage health-related needs will improve Outcome: Progressing   Problem: Clinical Measurements: Goal: Ability to maintain clinical measurements within normal limits will improve Outcome: Progressing Goal: Will remain free from infection Outcome: Progressing Goal: Diagnostic test results will improve Outcome: Progressing Goal: Respiratory complications will improve Outcome: Progressing Goal: Cardiovascular complication will be avoided Outcome: Progressing   Problem: Activity: Goal: Risk for activity intolerance will decrease Outcome: Progressing   Problem: Nutrition: Goal: Adequate nutrition will be maintained Outcome: Progressing   Problem: Elimination: Goal: Will not experience complications related to urinary retention Outcome: Progressing   Problem: Safety: Goal: Ability to remain free from injury will improve Outcome: Progressing   Problem: Skin Integrity: Goal: Risk for impaired skin integrity will decrease Outcome: Progressing   Problem: Clinical Measurements: Goal: Ability to avoid or minimize complications of infection will improve Outcome: Progressing   Problem: Skin Integrity: Goal: Skin integrity will improve Outcome: Progressing

## 2023-10-04 NOTE — Progress Notes (Signed)
 Progress Note    [name] [name]   FMW:[id number]  DOB: Mar 30, 1980  DOA: 09/25/2023     8 PCP: Pcp, No  Initial CC: Abdominal pain  Hospital Course: [name] [name] is a 43 yo female with PMH polysubstance abuse, depression/anxiety, septic emboli, necrotizing fasciitis of leg, spinal abscess. She originally presented with complaints of RUQ abdominal pain.  She underwent workup for infection and ultimately CT chest showed multifocal pneumonia and RUQ showed concern for cholecystitis however HIDA scan was normal. She also underwent consultation with ID during hospitalization. She was treated for opioid withdrawal with Precedex  drip in the ICU, required pressor support, and maintained on high flow oxygen for respiratory failure. UDS on admission was positive for amphetamines and opiates.  She endorsed use of fentanyl  routinely prior to hospitalization.  Interval History:  [name] [name] out of bed somehow overnight.  Details are unclear.  Asking me for pain medication this morning but pointing to lower abdomen.  Recommended she try Tylenol .  Still desaturating with ambulation, but hoping she can wean further in order to discharge home with oxygen.  Assessment and Plan: * Acute respiratory failure with hypoxia (HCC) - due to PNA, see separate problem - remains in 6L O2 when ambulating but desats at rest still - continue weaning as able - daily walk test  - Trying to set up home oxygen but needs to be on lower amounts ambulating  CAP (community acquired pneumonia) - CT angio chest negative for PE; shows diffuse patchy and interstitial opacities throughout both lungs concerning for multifocal pneumonia -Has completed antibiotic course and been followed by ID as well - Remains on cefadroxil  for cellulitis treatment  Cellulitis of both lower extremities - treated with vanc/cefepime /flagyl  briefly and now on cefadroxil  to complete course - 2-week course recommended total per ID  Substance abuse (HCC) -  Patient endorses to using fentanyl  on the street -UDS positive for amphetamine and opiates on admission - treated with precedex  in ICU and completed withdrawal; any further requests for IV opioids is behavior consistent with dependence and not withdrawal  Drug-seeking behavior - Patient very manipulative with staff throughout hospitalization - Medical care will continue appropriately and treating medical issues as deemed necessary and appropriately; escalation of opioids only as deemed medically necessary -Fentanyl  discontinued; she is able to have oxycodone  for pain which is deemed appropriate management at this time  Infectious process - multiple other workup performed given complexity of admission is summarized below: - HCV acute ruled out; HCV RNA is negative. 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  Deferred to ID -CMV ruled out, DNA quantitative negative -GC chlamydia negative -EBV DNA quantitative PCR positive; appears she may have positive infection.  Supportive care regardless  Elevated LFTs - Hepatic steatosis noted on CT imaging -LFTs significantly elevated on admission which have been downtrending; etiology possibly in setting of cholestasis of sepsis; no mention of biliary dilatation on imaging  Protein-calorie malnutrition, severe (HCC) - Suspect poor nutrition in setting of illicit drug use ongoing outpatient and poor personal care -Continue regular diet  Normocytic anemia - No bleeding appreciated -Likely bone marrow suppression from acute illness -Continue trending CBC  MDD (major depressive disorder), recurrent severe, without psychosis (HCC) - no home meds - psych eval requested per patient   Contamination of blood culture-resolved as of 10/03/2023 - Blood culture from  09/25/2023 noted with staph epi (only 1 set drawn, 2/2 positive) -repeat culture on 09/27/2023 remaining negative x 5 days -Cultures deemed likely contaminant after evaluation with ID previously also  Thrombocytopenia (HCC)-resolved as of 10/03/2023 - likely from acute illness - now improved   Hyponatremia-resolved as of 10/03/2023 - improved s/p IVF  RUQ pain-resolved as of 10/03/2023 - RUQ ultrasound was equivocal for acute cholecystitis; HIDA scan performed on 09/27/2023 and was normal   Old records reviewed in assessment of this patient  Antimicrobials: Cefepime  9/6 x 1 Rocephin  9/6 >> 9/12 Flagyl  9/6 >> 9/10 Vanc 9/6 >9/8 Cefadroxil  9/13 >> current   DVT prophylaxis:  SCDs Start: 09/26/23 0905   Code Status:   Code Status: Full Code  Mobility Assessment (Last 72 Hours)     Mobility Assessment     Row Name 10/04/23 0800 10/03/23 2135 10/03/23 0953 10/02/23 2015 10/02/23 1000   Does the patient have exclusion criteria? No - Perform mobility assessment No - Perform mobility assessment No - Perform mobility assessment No - Perform mobility assessment --   Mobility Assessment Exclusion Criteria No exclusion criteria present, perform mobility assessment No exclusion criteria present, perform mobility assessment -- No exclusion criteria present, perform mobility assessment --   What is the highest level of mobility based on the mobility assessment? Level 4 (Ambulates with assistance) - Balance while stepping forward/back - Complete Level 4 (Ambulates with assistance) - Balance while stepping forward/back - Complete Level 4 (Ambulates with assistance) - Balance while stepping forward/back - Complete Level 4 (Ambulates with assistance) - Balance while stepping forward/back - Complete Level 4 (Ambulates with assistance) - Balance while stepping forward/back - Complete   Is the above level different from baseline mobility prior to current illness? Yes - Recommend PT order No - Consider  discontinuing PT/OT -- No - Consider discontinuing PT/OT --    Row Name 10/02/23 0800 10/01/23 2020         Does the patient have exclusion criteria? No - Perform mobility assessment No - Perform mobility assessment      Mobility Assessment Exclusion Criteria -- No exclusion criteria present, perform mobility assessment      What is the highest level of mobility based on the mobility assessment? Level 3 (Stands with assistance) - Balance while standing  and cannot march in place Level 4 (Ambulates with assistance) - Balance while stepping forward/back - Complete      Is the above level different from baseline mobility prior to current illness? Yes - Recommend PT order Yes - Recommend PT order         Barriers to discharge: none Disposition Plan:  Home HH orders placed: n/a Status is: Inpt  Objective: Blood pressure (!) 183/110, pulse (!) 111, temperature 99 F (37.2 C), temperature source Oral, resp. rate (!) 35, height 5' 5 (1.651 m), weight 97.5 kg, last menstrual period 08/10/2011, SpO2 96%.  Examination:  Physical Exam Constitutional:      Comments: Extremely disheveled and unkempt appearing adult woman laying in bed in no actual distress.  Appears somewhat anxious  HENT:     Head: Normocephalic and atraumatic.     Mouth/Throat:     Mouth: Mucous membranes are moist.  Eyes:     Extraocular Movements: Extraocular movements intact.  Cardiovascular:     Rate and Rhythm: Normal rate and regular rhythm.  Pulmonary:     Effort: Pulmonary effort is normal. No respiratory distress.     Breath sounds: Normal breath sounds. No  wheezing.  Abdominal:     General: Bowel sounds are normal. There is no distension.     Palpations: Abdomen is soft.     Tenderness: There is no abdominal tenderness.  Musculoskeletal:        General: Normal range of motion.     Cervical back: Normal range of motion and neck supple.     Right lower leg: Edema present.     Left lower leg: Edema present.   Skin:    General: Skin is warm and dry.     Comments: Scattered skin abrasions throughout; track marks noted on arms  Neurological:     General: No focal deficit present.  Psychiatric:        Mood and Affect: Mood is anxious.      Consultants:  ID  Procedures:    Data Reviewed: Results for orders placed or performed during the hospital encounter of 09/25/23 (from the past 24 hours)  CBC with Differential/Platelet     Status: Abnormal   Collection Time: 10/04/23  6:30 AM  Result Value Ref Range   WBC 9.6 4.0 - 10.5 K/uL   RBC 3.04 (L) 3.87 - 5.11 MIL/uL   Hemoglobin 7.8 (L) 12.0 - 15.0 g/dL   HCT 73.8 (L) 63.9 - 53.9 %   MCV 85.9 80.0 - 100.0 fL   MCH 25.7 (L) 26.0 - 34.0 pg   MCHC 29.9 (L) 30.0 - 36.0 g/dL   RDW 80.1 (H) 88.4 - 84.4 %   Platelets 283 150 - 400 K/uL   nRBC 0.4 (H) 0.0 - 0.2 %   Neutrophils Relative % 70 %   Neutro Abs 6.8 1.7 - 7.7 K/uL   Lymphocytes Relative 16 %   Lymphs Abs 1.5 0.7 - 4.0 K/uL   Monocytes Relative 6 %   Monocytes Absolute 0.6 0.1 - 1.0 K/uL   Eosinophils Relative 1 %   Eosinophils Absolute 0.1 0.0 - 0.5 K/uL   Basophils Relative 0 %   Basophils Absolute 0.0 0.0 - 0.1 K/uL   Immature Granulocytes 7 %   Abs Immature Granulocytes 0.63 (H) 0.00 - 0.07 K/uL  Comprehensive metabolic panel with GFR     Status: Abnormal   Collection Time: 10/04/23  6:30 AM  Result Value Ref Range   Sodium 137 135 - 145 mmol/L   Potassium 3.7 3.5 - 5.1 mmol/L   Chloride 102 98 - 111 mmol/L   CO2 27 22 - 32 mmol/L   Glucose, Bld 110 (H) 70 - 99 mg/dL   BUN <5 (L) 6 - 20 mg/dL   Creatinine, Ser 9.49 0.44 - 1.00 mg/dL   Calcium  8.4 (L) 8.9 - 10.3 mg/dL   Total Protein 7.4 6.5 - 8.1 g/dL   Albumin  2.6 (L) 3.5 - 5.0 g/dL   AST 84 (H) 15 - 41 U/L   ALT 22 0 - 44 U/L   Alkaline Phosphatase 702 (H) 38 - 126 U/L   Total Bilirubin 1.3 (H) 0.0 - 1.2 mg/dL   GFR, Estimated >39 >39 mL/min   Anion gap 9 5 - 15  Magnesium      Status: None   Collection Time:  10/04/23  6:30 AM  Result Value Ref Range   Magnesium  2.4 1.7 - 2.4 mg/dL    I have reviewed pertinent nursing notes, vitals, labs, and images as necessary. I have ordered labwork to follow up on as indicated.  I have reviewed the last notes from staff over past 24 hours. I have  discussed patient's care plan and test results with nursing staff, CM/SW, and other staff as appropriate.  Time spent: Greater than 50% of the 55 minute visit was spent in counseling/coordination of care for the patient as laid out in the A&P.   LOS: 8 days   Alm Apo, MD [facility] 10/04/2023, 2:15 PM

## 2023-10-04 NOTE — TOC Progression Note (Signed)
 Transition of Care [facility]) - Progression Note    Patient Details  Name: [name] [name] MRN: [id number] Date of Birth: 1980-06-17  [facility] of [facility]) CM/SW Contact  Sonda Manuella Quill, RN Phone Number: 10/04/2023, 10:22 AM  Clinical Narrative:    Orders received for home oxygen; spoke w/ pt in room; she agreed to receive recc DME; she does not have agency preference; pt gave d/c address [address] [place] [phone]; she can be contacted at [phone]; referral given to Jermaine at [facility]; travel tank will be delivered to room; agency contact info placed in follow up provider section of d/c instructions; also no PCP listed; pt said she does not who her assigned PCP is; pt has Medicaid; pt advised to contact her insurance for name of assigned provider; she verbalized understanding.   Expected Discharge Plan: Home/Self Care Barriers to Discharge: Continued Medical Work up               Expected Discharge Plan and Services In-house Referral: NA Discharge Planning Services: CM Consult Post Acute Care Choice: NA Living arrangements for the past 2 months: Single Family Home                 DME Arranged: Oxygen DME Agency: [employer] Date DME Agency Contacted: 10/04/23 Time DME Agency Contacted: 1022 Representative spoke with at DME Agency: [place] HH Arranged: NA HH Agency: NA         Social Drivers of Health (SDOH) Interventions SDOH Screenings   Food Insecurity: No Food Insecurity (09/26/2023)  Housing: Patient Declined (09/26/2023)  Transportation Needs: No Transportation Needs (09/26/2023)  Utilities: Not At Risk (09/26/2023)  Depression (PHQ2-9): Medium Risk (04/06/2018)  Tobacco Use: High Risk (09/27/2023)    Readmission Risk Interventions    09/28/2023    3:19 PM  Readmission Risk Prevention Plan  Transportation Screening Complete  PCP or Specialist Appt within 5-7 Days Complete  Home Care Screening Complete  Medication Review (RN CM) Complete

## 2023-10-04 NOTE — Consult Note (Signed)
 [facility] Psychiatric Consult Initial  Patient Name: .[name] [name]  MRN: [id number]  DOB: 18-Feb-1980  Consult Order details:  Orders (From admission, onward)     Start     Ordered   10/03/23 1607  IP CONSULT TO PSYCHIATRY       Ordering Provider: Patsy Lenis, MD  Provider:  (Not yet assigned)  Question Answer Comment  Location [facility]   Reason for Consult? patient request; PTSD, depression, opioid dependence      10/03/23 1607             Mode of Visit: In person    Psychiatry Consult Evaluation  Service Date: October 04, 2023 LOS:  LOS: 8 days  Chief Complaint I don't sleep more than 20 minutes at a time.  Primary Psychiatric Diagnoses  Opiate use dependence, severe 2.  MDD 3.  GAD  Assessment  [name] [name] is a 43 y.o. female admitted: Medicallyfor 09/25/2023  8:32 PM with medical history significant of anxiety, depression, chronic back pain, history of cocaine abuse, history of alcohol  abuse, history of opioid use disorder, septic embolism, spinal epidural abscess with peristasis of the lower extremities, history of urinary retention, degenerative disc disease, endometriosis with chronic pain, GERD, migraine headaches, obesity who presented to the emergency department with fever and lower extremity swelling.  Psych history of opiate use d/o, severe; depression, anxiety, and PTSD.  Her current presentation of dysphoric mood, decreased in motivation, low energy, grief and self-medicating with opiates is most consistent with MDD.  Current outpatient psychotropic medications include none.  On initial examination, patient was very anxious and having difficulty breathing even with oxygen. Please see plan below for detailed recommendations.   Diagnoses:  Active Hospital problems: Principal Problem:   Acute respiratory failure with hypoxia (HCC) Active Problems:   MDD (major depressive disorder), recurrent severe, without psychosis (HCC)    Substance abuse (HCC)   Elevated LFTs   Cellulitis of both lower extremities   Normocytic anemia   Protein-calorie malnutrition, severe (HCC)   CAP (community acquired pneumonia)   Infectious process   Drug-seeking behavior    Plan   ## Psychiatric Medication Recommendations:  MDD: -Started Cymbalta  20 mg daily  GAD: -Started gabapentin  100 mg TID   Insomnia: -Started Rozerem  8 mg at bedtime  ## Medical Decision Making Capacity: Not specifically addressed in this encounter  ## Further Work-up:  -- most recent EKG on 09/28/23 had QtC of 471 -- Pertinent labwork reviewed earlier this admission includes: CBC and diff, chem panel, EKG, u/a, toxicology  ## Disposition:-- There are no psychiatric contraindications to discharge at this time  ## Behavioral / Environmental: - No specific recommendations at this time.    ## Safety and Observation Level:  - Based on my clinical evaluation, I estimate the patient to be at low risk of self harm in the current setting. - At this time, we recommend  routine. This decision is based on my review of the chart including patient's history and current presentation, interview of the patient, mental status examination, and consideration of suicide risk including evaluating suicidal ideation, plan, intent, suicidal or self-harm behaviors, risk factors, and protective factors. This judgment is based on our ability to directly address suicide risk, implement suicide prevention strategies, and develop a safety plan while the patient is in the clinical setting. Please contact our team if there is a concern that risk level has changed.  CSSR Risk Category:C-SSRS RISK CATEGORY: No Risk  Suicide Risk Assessment:  Patient has following modifiable risk factors for suicide: untreated depression and recent loss (death, isolation, vocation), which we are addressing by medication management and recommendation for methadone MAT. Patient has following non-modifiable  or demographic risk factors for suicide: psychiatric hospitalization Patient has the following protective factors against suicide: Supportive friends and no history of NSSIB  Thank you for this consult request. Recommendations have been communicated to the primary team.  We will continue to follow at this time.   Sharlot Becker, NP       History of Present Illness  Relevant Aspects of [facility] Course:  Admitted on 09/25/2023 with medical history significant of anxiety, depression, chronic back pain, history of cocaine abuse, history of alcohol  abuse, history of opioid use disorder, septic embolism, spinal epidural abscess with peristasis of the lower extremities, history of urinary retention, degenerative disc disease, endometriosis with chronic pain, GERD, migraine headaches, obesity who presented to the emergency department with fever and lower extremity swelling.  Psych history of opiate use d/o, severe; depression, anxiety, and PTSD.  Patient Report:  43 yo female admitted for upper quadrant pain and found to have pneumonia and other medical concerns with an 8 day stay so far with a request to see psychiatry.  She reported her depression was bad with no suicidal ideations, past suicide attempts with several admissions at [facility] for polysubstance use and mental health needs (depression, anxiety).  Her anxiety is really bad, I worry all the time and cannot control it.  She quickly transitioned to the statement, My daughter died here.  I have a lot of PTSD and scared to go to the doctor.  Her daughter was 31 and went into septic shock from a dislocated knee, died suddenly.  Her symptoms increased after this and she gave up my boys to their dad, ages 29 & 42 now.  Prior to admission, she was using about 1 gram of heroin which she contributes to doctors stopping Rx for her three 80 mg of oxys for her back pain related to past trauma with rods in her back.  Past use of cocaine abuse and  other substances.  She was successful for 5 years at [facility] on methadone, evidently she started when she was pregnant as she said she was able to be seen quickly.  When she tried recently, the wait was too long and she returned to using heroin.  She would like to return to a methadone.  She is currently living with her boyfriend of 8 years who also uses heroin.  Discussed medications and made changes, see treatment plan.  Psych ROS:  Depression: bad Anxiety:  real bad Mania (lifetime and current): none Psychosis: (lifetime and current): none  Review of Systems  Constitutional:  Positive for malaise/fatigue.  HENT: Negative.    Eyes: Negative.   Respiratory:  Positive for cough and sputum production.   Cardiovascular: Negative.   Gastrointestinal: Negative.   Genitourinary: Negative.   Musculoskeletal:  Positive for myalgias.  Skin: Negative.   Neurological:  Positive for weakness.  Endo/Heme/Allergies: Negative.   Psychiatric/Behavioral:  Positive for depression and substance abuse. The patient is nervous/anxious.      Psychiatric and Social History  Psychiatric History:  Information collected from patient and chart.  Prev Dx/Sx: MDD, GAD, PTSD Current Psych Provider: none Home Meds (current): none Previous Med Trials: unknown Therapy: none currently  Prior Psych Hospitalization: [facility], several Prior Self Harm: OD in the past Prior Violence: none  Social History:  Legal Hx: denied  Living Situation: lives with her boyfriend who also uses heroin  Access to weapons/lethal means: denied   Substance History Long history of polysubstance use including opiates, heroin, cocaine, etc.  Exam Findings  Physical Exam: completed by the MD, reviewed by this practitioner. Vital Signs:  Temp:  [98.1 F (36.7 C)-99.2 F (37.3 C)] 99.2 F (37.3 C) (09/14 0800) Pulse Rate:  [84-116] 112 (09/14 0800) Resp:  [17-43] 17 (09/14 0800) BP: (136-162)/(87-101) 157/101 (09/14 0800) SpO2:   [92 %-100 %] 97 % (09/14 0800) Blood pressure (!) 157/101, pulse (!) 112, temperature 99.2 F (37.3 C), temperature source Axillary, resp. rate 17, height 5' 5 (1.651 m), weight 97.5 kg, last menstrual period 08/10/2011, SpO2 97%. Body mass index is 35.77 kg/m.  Physical Exam  Mental Status Exam: General Appearance: Disheveled  Orientation:  Full (Time, Place, and Person)  Memory:  Immediate;   Fair Recent;   Fair Remote;   Fair  Concentration:  Concentration: Fair and Attention Span: Fair  Recall:  Fair  Attention  Fair  Eye Contact:  Good  Speech:  Clear and Coherent  Language:  Good  Volume:  Normal  Mood: depressed, anxious  Affect:  Congruent  Thought Process:  Coherent  Thought Content:  Logical  Suicidal Thoughts:  No  Homicidal Thoughts:  No  Judgement:  Fair  Insight:  Fair  Psychomotor Activity:  Decreased  Akathisia:  No  Fund of Knowledge:  Fair      Assets:  Housing Leisure Time Resilience Social Support  Cognition:  WNL  ADL's:  Impaired  AIMS (if indicated):        Other History   These have been pulled in through the EMR, reviewed, and updated if appropriate.  Family History:  The patient's family history includes Breast cancer in her mother; Cancer in her mother; Coronary artery disease in her father and maternal grandmother.  Medical History: Past Medical History:  Diagnosis Date   Alcohol  use disorder 01/04/2012   Detox medically managed successfully during inpatient, adequate for discharge      Anxiety    klonipin for stress disorder   Chronic back pain    Cocaine abuse (HCC) 07/06/2013   Degenerative disc disease    Depression 05/22/2015   Endometriosis    chronic pain   GERD (gastroesophageal reflux disease)    protonix  evenings   Migraine    Septic embolism (HCC)    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   Urinary retention     Surgical History: Past Surgical History:  Procedure Laterality Date   ABDOMINAL HYSTERECTOMY      APPENDECTOMY     APPLICATION OF WOUND VAC Left 10/28/2019   Procedure: APPLICATION OF WOUND VAC;  Surgeon: Harden Jerona GAILS, MD;  Location: [facility] OR;  Service: Orthopedics;  Laterality: Left;   I & D EXTREMITY Left 10/22/2019   Procedure: IRRIGATION AND DEBRIDEMENT EXTREMITY; APPLICATION OF WOUND VAC;  Surgeon: Beverley Evalene BIRCH, MD;  Location: [facility] OR;  Service: Orthopedics;  Laterality: Left;   I & D EXTREMITY Left 10/26/2019   Procedure: DEBRIDEMENT LEFT TIBIA;  Surgeon: Harden Jerona GAILS, MD;  Location: [facility] OR;  Service: Orthopedics;  Laterality: Left;  left   I & D EXTREMITY Left 10/28/2019   Procedure: REPEAT DEBRIDEMENT LEFT TIBIA;  Surgeon: Harden Jerona GAILS, MD;  Location: [facility] OR;  Service: Orthopedics;  Laterality: Left;   IR THORACENTESIS ASP PLEURAL SPACE W/IMG GUIDE  10/28/2018   LAMINECTOMY FOR CEREBROSPINAL FLUID LEAK N/A 10/31/2018  Procedure: Thoracic Wound Exploration;  Surgeon: Gillie Duncans, MD;  Location: [facility] OR;  Service: Neurosurgery;  Laterality: N/A;   LAPAROSCOPIC ASSISTED VAGINAL HYSTERECTOMY  08/19/2011   Procedure: LAPAROSCOPIC ASSISTED VAGINAL HYSTERECTOMY;  Surgeon: Peggye Gull, MD;  Location: [facility] ORS;  Service: Gynecology;  Laterality: N/A;   TEE WITHOUT CARDIOVERSION N/A 10/25/2018   Procedure: TRANSESOPHAGEAL ECHOCARDIOGRAM (TEE);  Surgeon: Delford Maude BROCKS, MD;  Location: [facility] ENDOSCOPY;  Service: Cardiovascular;  Laterality: N/A;   THORACIC LAMINECTOMY FOR EPIDURAL ABSCESS N/A 10/20/2018   Procedure: THORACIC Eight, Thoracic nine LAMINECTOMY FOR EPIDURAL ABSCESS;  Surgeon: Joshua Alm RAMAN, MD;  Location: [facility] OR;  Service: Neurosurgery;  Laterality: N/A;   TONSILLECTOMY     TUBAL LIGATION       Medications:   Current Facility-Administered Medications:    acetaminophen  (TYLENOL ) tablet 650 mg, 650 mg, Oral, Q4H PRN, Girguis, David, MD, 650 mg at 10/03/23 1353   albuterol  (PROVENTIL ) (2.5 MG/3ML) 0.083% nebulizer solution 2.5 mg, 2.5 mg, Nebulization, Q4H PRN, Celinda Alm Lot, MD,  2.5 mg at 09/30/23 1358   cefadroxil  (DURICEF) capsule 500 mg, 500 mg, Oral, BID, Mannam, Praveen, MD, 500 mg at 10/04/23 9040   Chlorhexidine  Gluconate Cloth 2 % PADS 6 each, 6 each, Topical, Q2200, Mannam, Praveen, MD, 6 each at 10/03/23 2301   [COMPLETED] cloNIDine  (CATAPRES ) tablet 0.1 mg, 0.1 mg, Oral, TID, 0.1 mg at 10/01/23 2119 **FOLLOWED BY** [COMPLETED] cloNIDine  (CATAPRES ) tablet 0.1 mg, 0.1 mg, Oral, BH-qamhs, 0.1 mg at 10/03/23 2125 **FOLLOWED BY** cloNIDine  (CATAPRES ) tablet 0.1 mg, 0.1 mg, Oral, Daily, Desai, Rahul P, PA-C, 0.1 mg at 10/04/23 9047   DULoxetine  (CYMBALTA ) DR capsule 20 mg, 20 mg, Oral, Daily, Archibald Marchetta Y, NP   gabapentin  (NEURONTIN ) capsule 100 mg, 100 mg, Oral, TID, Kadarius Cuffe, Sharlot GRADE, NP   ibuprofen  (ADVIL ) tablet 400 mg, 400 mg, Oral, Q6H PRN, Girguis, David, MD   influenza vac split trivalent PF (FLUZONE ) injection 0.5 mL, 0.5 mL, Intramuscular, Tomorrow-1000, Celinda Alm Lot, MD   LORazepam  (ATIVAN ) tablet 1 mg, 1 mg, Oral, Q6H PRN, Jillian Buttery, MD, 1 mg at 10/04/23 0952   melatonin tablet 5 mg, 5 mg, Oral, QHS PRN, Mannam, Praveen, MD, 5 mg at 10/02/23 0118   nicotine  (NICODERM CQ  - dosed in mg/24 hours) patch 21 mg, 21 mg, Transdermal, Daily PRN, Celinda Alm Lot, MD, 21 mg at 10/02/23 1926   Oral care mouth rinse, 15 mL, Mouth Rinse, PRN, Celinda Alm Lot, MD   oxyCODONE  (Oxy IR/ROXICODONE ) immediate release tablet 5 mg, 5 mg, Oral, Q4H PRN, Girguis, David, MD, 5 mg at 10/04/23 9356   prochlorperazine  (COMPAZINE ) injection 10 mg, 10 mg, Intravenous, Q6H PRN, Celinda Alm Lot, MD, 10 mg at 10/04/23 9040   ramelteon  (ROZEREM ) tablet 8 mg, 8 mg, Oral, QHS, Aryan Sparks Y, NP   sodium chloride  (OCEAN) 0.65 % nasal spray 1 spray, 1 spray, Each Nare, PRN, Mannam, Praveen, MD, 1 spray at 10/01/23 1235   sodium chloride  flush (NS) 0.9 % injection 10-40 mL, 10-40 mL, Intracatheter, PRN, Jillian Buttery, MD  Allergies: Allergies  Allergen Reactions    Epidural Tray [id number] [Nerve Block Tray] Other (See Comments)    Paralysis and severe pain in head/neck/shoulders.  No anaphylaxis.   Ibuprofen  Hives   Penicillins Hives     Tolerated Cephalosporin 10/22/2019.   Pt has tolerated cephalosporins on past admissions. Has patient had a PCN reaction causing immediate rash, facial/tongue/throat swelling, SOB or lightheadedness with hypotension: Yes Has patient had a PCN  reaction causing severe rash involving mucus membranes or skin necrosis: No Has patient had a PCN reaction that required hospitalization No Has patient had a PCN reaction occurring within the last 10 years: No If all of the above answers are NO, then may   Zofran  [Ondansetron  Hcl] Nausea And Vomiting    Sharlot Becker, NP

## 2023-10-04 NOTE — Assessment & Plan Note (Addendum)
-   no home meds - psych eval requested per patient; seen on 10/04/2023 - Patient started on Cymbalta  for depression, gabapentin  for GAD, Rozerem  for insomnia

## 2023-10-04 NOTE — Progress Notes (Signed)
 Pt tx from ICU condition stable. No changes in initial am assessment at this time. Cont with plan of care

## 2023-10-05 ENCOUNTER — Other Ambulatory Visit (HOSPITAL_COMMUNITY): Payer: Self-pay

## 2023-10-05 ENCOUNTER — Other Ambulatory Visit: Payer: Self-pay

## 2023-10-05 DIAGNOSIS — F1910 Other psychoactive substance abuse, uncomplicated: Secondary | ICD-10-CM

## 2023-10-05 DIAGNOSIS — F332 Major depressive disorder, recurrent severe without psychotic features: Secondary | ICD-10-CM

## 2023-10-05 DIAGNOSIS — F329 Major depressive disorder, single episode, unspecified: Secondary | ICD-10-CM | POA: Diagnosis not present

## 2023-10-05 DIAGNOSIS — F411 Generalized anxiety disorder: Secondary | ICD-10-CM | POA: Diagnosis not present

## 2023-10-05 DIAGNOSIS — J189 Pneumonia, unspecified organism: Secondary | ICD-10-CM | POA: Diagnosis not present

## 2023-10-05 DIAGNOSIS — J9601 Acute respiratory failure with hypoxia: Secondary | ICD-10-CM | POA: Diagnosis not present

## 2023-10-05 DIAGNOSIS — L03115 Cellulitis of right lower limb: Secondary | ICD-10-CM | POA: Diagnosis not present

## 2023-10-05 DIAGNOSIS — F1120 Opioid dependence, uncomplicated: Secondary | ICD-10-CM | POA: Diagnosis not present

## 2023-10-05 LAB — COMPREHENSIVE METABOLIC PANEL WITH GFR
ALT: 21 U/L (ref 0–44)
AST: 73 U/L — ABNORMAL HIGH (ref 15–41)
Albumin: 2.7 g/dL — ABNORMAL LOW (ref 3.5–5.0)
Alkaline Phosphatase: 617 U/L — ABNORMAL HIGH (ref 38–126)
Anion gap: 9 (ref 5–15)
BUN: <5 mg/dL — ABNORMAL LOW (ref 6–20)
CO2: 25 mmol/L (ref 22–32)
Calcium: 8.3 mg/dL — ABNORMAL LOW (ref 8.9–10.3)
Chloride: 103 mmol/L (ref 98–111)
Creatinine, Ser: 0.47 mg/dL (ref 0.44–1.00)
GFR, Estimated: >60 mL/min (ref >60)
Glucose, Bld: 116 mg/dL — ABNORMAL HIGH (ref 70–99)
Potassium: 3.5 mmol/L (ref 3.5–5.1)
Sodium: 137 mmol/L (ref 135–145)
Total Bilirubin: 1.4 mg/dL — ABNORMAL HIGH (ref 0.0–1.2)
Total Protein: 7.4 g/dL (ref 6.5–8.1)

## 2023-10-05 LAB — CBC WITH DIFFERENTIAL/PLATELET
Abs Immature Granulocytes: 0.54 K/uL — ABNORMAL HIGH (ref 0.00–0.07)
Basophils Absolute: 0 K/uL (ref 0.0–0.1)
Basophils Relative: 0 %
Eosinophils Absolute: 0.1 K/uL (ref 0.0–0.5)
Eosinophils Relative: 1 %
HCT: 26.8 % — ABNORMAL LOW (ref 36.0–46.0)
Hemoglobin: 7.7 g/dL — ABNORMAL LOW (ref 12.0–15.0)
Immature Granulocytes: 6 %
Lymphocytes Relative: 17 %
Lymphs Abs: 1.5 K/uL (ref 0.7–4.0)
MCH: 24.9 pg — ABNORMAL LOW (ref 26.0–34.0)
MCHC: 28.7 g/dL — ABNORMAL LOW (ref 30.0–36.0)
MCV: 86.7 fL (ref 80.0–100.0)
Monocytes Absolute: 0.6 K/uL (ref 0.1–1.0)
Monocytes Relative: 6 %
Neutro Abs: 6.2 K/uL (ref 1.7–7.7)
Neutrophils Relative %: 70 %
Platelets: 301 K/uL (ref 150–400)
RBC: 3.09 MIL/uL — ABNORMAL LOW (ref 3.87–5.11)
RDW: 20.1 % — ABNORMAL HIGH (ref 11.5–15.5)
WBC: 8.9 K/uL (ref 4.0–10.5)
nRBC: 0.6 % — ABNORMAL HIGH (ref 0.0–0.2)

## 2023-10-05 LAB — MAGNESIUM: Magnesium: 2.4 mg/dL (ref 1.7–2.4)

## 2023-10-05 MED ORDER — DULOXETINE HCL 20 MG PO CPEP
20.0000 mg | ORAL_CAPSULE | Freq: Every day | ORAL | 3 refills | Status: DC
  Filled 2023-10-05: qty 30, 30d supply, fill #0

## 2023-10-05 MED ORDER — GABAPENTIN 100 MG PO CAPS
100.0000 mg | ORAL_CAPSULE | Freq: Three times a day (TID) | ORAL | 3 refills | Status: DC
  Filled 2023-10-05: qty 90, 30d supply, fill #0

## 2023-10-05 MED ORDER — CEFADROXIL 500 MG PO CAPS
500.0000 mg | ORAL_CAPSULE | Freq: Two times a day (BID) | ORAL | 0 refills | Status: DC
  Filled 2023-10-05: qty 8, 4d supply, fill #0

## 2023-10-05 MED ORDER — DULOXETINE HCL 20 MG PO CPEP
20.0000 mg | ORAL_CAPSULE | Freq: Two times a day (BID) | ORAL | Status: DC
Start: 2023-10-05 — End: 2023-10-06
  Administered 2023-10-05 – 2023-10-06 (×2): 20 mg via ORAL
  Filled 2023-10-05 (×2): qty 1

## 2023-10-05 MED ORDER — RAMELTEON 8 MG PO TABS
8.0000 mg | ORAL_TABLET | Freq: Every day | ORAL | 1 refills | Status: DC
  Filled 2023-10-05: qty 15, 15d supply, fill #0

## 2023-10-05 NOTE — Plan of Care (Signed)
  Problem: Education: Goal: Knowledge of General Education information will improve Description: Including pain rating scale, medication(s)/side effects and non-pharmacologic comfort measures Outcome: Progressing   Problem: Health Behavior/Discharge Planning: Goal: Ability to manage health-related needs will improve Outcome: Progressing   Problem: Clinical Measurements: Goal: Ability to maintain clinical measurements within normal limits will improve Outcome: Progressing Goal: Will remain free from infection Outcome: Progressing Goal: Diagnostic test results will improve Outcome: Progressing Goal: Respiratory complications will improve Outcome: Progressing Goal: Cardiovascular complication will be avoided Outcome: Progressing   Problem: Activity: Goal: Risk for activity intolerance will decrease Outcome: Progressing   Problem: Nutrition: Goal: Adequate nutrition will be maintained Outcome: Progressing   Problem: Elimination: Goal: Will not experience complications related to bowel motility Outcome: Progressing Goal: Will not experience complications related to urinary retention Outcome: Progressing   Problem: Pain Managment: Goal: General experience of comfort will improve and/or be controlled Outcome: Progressing   Problem: Safety: Goal: Ability to remain free from injury will improve Outcome: Progressing   Problem: Skin Integrity: Goal: Risk for impaired skin integrity will decrease Outcome: Progressing   Problem: Clinical Measurements: Goal: Ability to avoid or minimize complications of infection will improve Outcome: Progressing   Problem: Skin Integrity: Goal: Skin integrity will improve Outcome: Progressing   Problem: Coping: Goal: Level of anxiety will decrease Outcome: Not Progressing

## 2023-10-05 NOTE — Progress Notes (Signed)
 Progress Note    [name] [name]   FMW:[id number]  DOB: 1980-11-18  DOA: 09/25/2023     9 PCP: Pcp, No  Initial CC: Abdominal pain  Hospital Course: Ms. [name] [name] is a 43 yo female with PMH polysubstance abuse, depression/anxiety, septic emboli, necrotizing fasciitis of leg, spinal abscess. She originally presented with complaints of RUQ abdominal pain.  She underwent workup for infection and ultimately CT chest showed multifocal pneumonia and RUQ showed concern for cholecystitis however HIDA scan was normal. She also underwent consultation with ID during hospitalization. She was treated for opioid withdrawal with Precedex  drip in the ICU, required pressor support, and maintained on high flow oxygen for respiratory failure. UDS on admission was positive for amphetamines and opiates.  She endorsed use of fentanyl  routinely prior to hospitalization.  Interval History:  No events overnight.  Resting comfortably when seen this morning.  Psychiatry saw patient yesterday and started Cymbalta , gabapentin , Rozerem . Once she is able to ambulate without needing more than 5 L oxygen, we can likely discharge her home.  Assessment and Plan: * Acute respiratory failure with hypoxia (HCC) - due to PNA, see separate problem - remains in 6L O2 when ambulating but desats at rest still - continue weaning as able - daily walk test  - Trying to set up home oxygen but needs to be on lower amounts ambulating  CAP (community acquired pneumonia) - CT angio chest negative for PE; shows diffuse patchy and interstitial opacities throughout both lungs concerning for multifocal pneumonia -Has completed antibiotic course and been followed by ID as well - Remains on cefadroxil  for cellulitis treatment  Cellulitis of both lower extremities - treated with vanc/cefepime /flagyl  briefly and now on cefadroxil  to complete course - 2-week course recommended total per ID  Substance abuse (HCC) - Patient endorses to using  fentanyl  on the street -UDS positive for amphetamine and opiates on admission - treated with precedex  in ICU and completed withdrawal; any further requests for IV opioids is behavior consistent with dependence and not withdrawal  Drug-seeking behavior - Patient very manipulative with staff throughout hospitalization - Medical care will continue appropriately and treating medical issues as deemed necessary and appropriately; escalation of opioids only as deemed medically necessary -Fentanyl  discontinued; she is able to have oxycodone  for pain which is deemed appropriate management at this time  Infectious process - multiple other workup performed given complexity of admission is summarized below: - HCV acute ruled out; HCV RNA is negative. 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  Deferred to ID -CMV ruled out, DNA quantitative negative -GC chlamydia negative -EBV DNA quantitative PCR positive; appears she may have positive infection.  Supportive care regardless  Elevated LFTs - Hepatic steatosis noted on CT imaging -LFTs significantly elevated on admission which have been downtrending; etiology possibly in setting of cholestasis of sepsis; no mention of biliary dilatation on imaging  Protein-calorie malnutrition, severe (HCC) - Suspect poor nutrition in setting of illicit drug use ongoing outpatient and poor personal care -Continue regular diet  Normocytic anemia - No bleeding appreciated -Likely bone marrow suppression from acute illness -Continue trending CBC  MDD (major depressive disorder), recurrent severe, without psychosis (HCC) - no home meds - psych eval requested per patient; seen on 10/04/2023 - Patient started on Cymbalta  for depression, gabapentin  for GAD, Rozerem  for  insomnia  Contamination of  blood culture-resolved as of 10/03/2023 - Blood culture from 09/25/2023 noted with staph epi (only 1 set drawn, 2/2 positive) -repeat culture on 09/27/2023 remaining negative x 5 days -Cultures deemed likely contaminant after evaluation with ID previously also  Thrombocytopenia (HCC)-resolved as of 10/03/2023 - likely from acute illness - now improved   Hyponatremia-resolved as of 10/03/2023 - improved s/p IVF  RUQ pain-resolved as of 10/03/2023 - RUQ ultrasound was equivocal for acute cholecystitis; HIDA scan performed on 09/27/2023 and was normal   Old records reviewed in assessment of this patient  Antimicrobials: Cefepime  9/6 x 1 Rocephin  9/6 >> 9/12 Flagyl  9/6 >> 9/10 Vanc 9/6 >9/8 Cefadroxil  9/13 >> current   DVT prophylaxis:  SCDs Start: 09/26/23 0905   Code Status:   Code Status: Full Code  Mobility Assessment (Last 72 Hours)     Mobility Assessment     Row Name 10/05/23 1022 10/05/23 0013 10/04/23 0800 10/03/23 2135 10/03/23 0953   Does the patient have exclusion criteria? No - Perform mobility assessment No - Perform mobility assessment No - Perform mobility assessment No - Perform mobility assessment No - Perform mobility assessment   Mobility Assessment Exclusion Criteria No exclusion criteria present, perform mobility assessment -- No exclusion criteria present, perform mobility assessment No exclusion criteria present, perform mobility assessment --   What is the highest level of mobility based on the mobility assessment? Level 4 (Ambulates with assistance) - Balance while stepping forward/back - Complete Level 4 (Ambulates with assistance) - Balance while stepping forward/back - Complete Level 4 (Ambulates with assistance) - Balance while stepping forward/back - Complete Level 4 (Ambulates with assistance) - Balance while stepping forward/back - Complete Level 4 (Ambulates with assistance) - Balance while stepping forward/back - Complete   Is  the above level different from baseline mobility prior to current illness? -- -- Yes - Recommend PT order No - Consider discontinuing PT/OT --    Row Name 10/02/23 2015           Does the patient have exclusion criteria? No - Perform mobility assessment       Mobility Assessment Exclusion Criteria No exclusion criteria present, perform mobility assessment       What is the highest level of mobility based on the mobility assessment? Level 4 (Ambulates with assistance) - Balance while stepping forward/back - Complete       Is the above level different from baseline mobility prior to current illness? No - Consider discontinuing PT/OT          Barriers to discharge: none Disposition Plan:  Home HH orders placed: n/a Status is: Inpt  Objective: Blood pressure (!) 146/82, pulse (!) 123, temperature (!) 100.9 F (38.3 C), temperature source Oral, resp. rate 20, height 5' 5 (1.651 m), weight 97.5 kg, last menstrual period 08/10/2011, SpO2 96%.  Examination:  Physical Exam Constitutional:      Comments: Extremely disheveled and unkempt appearing adult woman laying in bed in no actual distress.  Appears somewhat anxious  HENT:     Head: Normocephalic and atraumatic.     Mouth/Throat:     Mouth: Mucous membranes are moist.  Eyes:     Extraocular Movements: Extraocular movements intact.  Cardiovascular:     Rate and Rhythm: Normal rate and regular rhythm.  Pulmonary:     Effort: Pulmonary effort is normal. No respiratory distress.     Breath sounds: Normal breath sounds. No wheezing.  Abdominal:     General: Bowel sounds are normal. There  is no distension.     Palpations: Abdomen is soft.     Tenderness: There is no abdominal tenderness.  Musculoskeletal:        General: Normal range of motion.     Cervical back: Normal range of motion and neck supple.     Right lower leg: Edema present.     Left lower leg: Edema present.  Skin:    General: Skin is warm and dry.     Comments:  Scattered skin abrasions throughout; track marks noted on arms  Neurological:     General: No focal deficit present.  Psychiatric:        Mood and Affect: Mood is anxious.      Consultants:  ID  Procedures:    Data Reviewed: Results for orders placed or performed during the hospital encounter of 09/25/23 (from the past 24 hours)  CBC with Differential/Platelet     Status: Abnormal   Collection Time: 10/05/23  2:35 AM  Result Value Ref Range   WBC 8.9 4.0 - 10.5 K/uL   RBC 3.09 (L) 3.87 - 5.11 MIL/uL   Hemoglobin 7.7 (L) 12.0 - 15.0 g/dL   HCT 73.1 (L) 63.9 - 53.9 %   MCV 86.7 80.0 - 100.0 fL   MCH 24.9 (L) 26.0 - 34.0 pg   MCHC 28.7 (L) 30.0 - 36.0 g/dL   RDW 79.8 (H) 88.4 - 84.4 %   Platelets 301 150 - 400 K/uL   nRBC 0.6 (H) 0.0 - 0.2 %   Neutrophils Relative % 70 %   Neutro Abs 6.2 1.7 - 7.7 K/uL   Lymphocytes Relative 17 %   Lymphs Abs 1.5 0.7 - 4.0 K/uL   Monocytes Relative 6 %   Monocytes Absolute 0.6 0.1 - 1.0 K/uL   Eosinophils Relative 1 %   Eosinophils Absolute 0.1 0.0 - 0.5 K/uL   Basophils Relative 0 %   Basophils Absolute 0.0 0.0 - 0.1 K/uL   Immature Granulocytes 6 %   Abs Immature Granulocytes 0.54 (H) 0.00 - 0.07 K/uL  Comprehensive metabolic panel with GFR     Status: Abnormal   Collection Time: 10/05/23  2:35 AM  Result Value Ref Range   Sodium 137 135 - 145 mmol/L   Potassium 3.5 3.5 - 5.1 mmol/L   Chloride 103 98 - 111 mmol/L   CO2 25 22 - 32 mmol/L   Glucose, Bld 116 (H) 70 - 99 mg/dL   BUN <5 (L) 6 - 20 mg/dL   Creatinine, Ser 9.52 0.44 - 1.00 mg/dL   Calcium  8.3 (L) 8.9 - 10.3 mg/dL   Total Protein 7.4 6.5 - 8.1 g/dL   Albumin  2.7 (L) 3.5 - 5.0 g/dL   AST 73 (H) 15 - 41 U/L   ALT 21 0 - 44 U/L   Alkaline Phosphatase 617 (H) 38 - 126 U/L   Total Bilirubin 1.4 (H) 0.0 - 1.2 mg/dL   GFR, Estimated >39 >39 mL/min   Anion gap 9 5 - 15  Magnesium      Status: None   Collection Time: 10/05/23  2:35 AM  Result Value Ref Range   Magnesium   2.4 1.7 - 2.4 mg/dL    I have reviewed pertinent nursing notes, vitals, labs, and images as necessary. I have ordered labwork to follow up on as indicated.  I have reviewed the last notes from staff over past 24 hours. I have discussed patient's care plan and test results with nursing staff, CM/SW, and other  staff as appropriate.  Time spent: Greater than 50% of the 55 minute visit was spent in counseling/coordination of care for the patient as laid out in the A&P.   LOS: 9 days   Alm Apo, MD [facility] 10/05/2023, 12:34 PM

## 2023-10-05 NOTE — Consult Note (Signed)
 [facility] Psychiatric Consult Initial  Patient Name: .[name] [name]  MRN: [id number]  DOB: 08/22/1980  Consult Order details:  Orders (From admission, onward)     Start     Ordered   10/03/23 1607  IP CONSULT TO PSYCHIATRY       Ordering Provider: Patsy Lenis, MD  Provider:  (Not yet assigned)  Question Answer Comment  Location [facility]   Reason for Consult? patient request; PTSD, depression, opioid dependence      10/03/23 1607             Mode of Visit: In person    Psychiatry Consult Evaluation  Service Date: October 05, 2023 LOS:  LOS: 9 days  Chief Complaint I don't sleep more than 20 minutes at a time.  Primary Psychiatric Diagnoses  Opiate use dependence, severe 2.  MDD 3.  GAD  Assessment  [name] [name] is a 43 y.o. female admitted: Medicallyfor 09/25/2023  8:32 PM with medical history significant of anxiety, depression, chronic back pain, history of cocaine abuse, history of alcohol  abuse, history of opioid use disorder, septic embolism, spinal epidural abscess with peristasis of the lower extremities, history of urinary retention, degenerative disc disease, endometriosis with chronic pain, GERD, migraine headaches, obesity who presented to the emergency department with fever and lower extremity swelling.  Psych history of opiate use d/o, severe; depression, anxiety, and PTSD.  Her current presentation of dysphoric mood, decreased in motivation, low energy, grief and self-medicating with opiates is most consistent with MDD.  Current outpatient psychotropic medications include none.  On initial examination, patient was very anxious and having difficulty breathing even with oxygen. Please see plan below for detailed recommendations.   Patient was seen and assessed lying in a fetal position on her left side in bed. She appeared physically uncomfortable and was noted to have abnormal breathing patterns, with possible tachycardia  observed--likely related to ongoing withdrawal symptoms. Despite her physical presentation, the patient was alert, oriented, and engaged well with this provider throughout the encounter. She responded appropriately to questions and demonstrated cooperative behavior. Her affect was appropriate to the interaction, and she maintained good eye contact when prompted. There were no signs of acute distress in her speech or behavior. She has been actively participating in her treatment plan and remains compliant with prescribed medications. Nursing staff report no concerns regarding her behavior on the unit, and she continues to attend to basic needs such as eating and sleeping. Overall, although physically symptomatic, she presents as psychiatrically stable and invested in her care.  Diagnoses:  Active Hospital problems: Principal Problem:   Acute respiratory failure with hypoxia (HCC) Active Problems:   MDD (major depressive disorder), recurrent severe, without psychosis (HCC)   Substance abuse (HCC)   Elevated LFTs   Cellulitis of both lower extremities   Normocytic anemia   Protein-calorie malnutrition, severe (HCC)   CAP (community acquired pneumonia)   Infectious process   Drug-seeking behavior    Plan   ## Psychiatric Medication Recommendations:  MDD: -Started Cymbalta  20 mg daily  GAD: -Started gabapentin  100 mg TID   Insomnia: -Started Rozerem  8 mg at bedtime  ## Medical Decision Making Capacity: Not specifically addressed in this encounter  ## Further Work-up:  -- most recent EKG on 09/28/23 had QtC of 471 -- Pertinent labwork reviewed earlier this admission includes: CBC and diff, chem panel, EKG, u/a, toxicology  ## Disposition:-- There are no psychiatric contraindications to discharge at this time  ## Behavioral /  Environmental: - No specific recommendations at this time.    ## Safety and Observation Level:  - Based on my clinical evaluation, I estimate the patient to be  at low risk of self harm in the current setting. - At this time, we recommend  routine. This decision is based on my review of the chart including patient's history and current presentation, interview of the patient, mental status examination, and consideration of suicide risk including evaluating suicidal ideation, plan, intent, suicidal or self-harm behaviors, risk factors, and protective factors. This judgment is based on our ability to directly address suicide risk, implement suicide prevention strategies, and develop a safety plan while the patient is in the clinical setting. Please contact our team if there is a concern that risk level has changed.  CSSR Risk Category:C-SSRS RISK CATEGORY: No Risk  Suicide Risk Assessment: Patient has following modifiable risk factors for suicide: untreated depression and recent loss (death, isolation, vocation), which we are addressing by medication management and recommendation for methadone MAT. Patient has following non-modifiable or demographic risk factors for suicide: psychiatric hospitalization Patient has the following protective factors against suicide: Supportive friends and no history of NSSIB  Thank you for this consult request. Recommendations have been communicated to the primary team.  We will continue to follow at this time.   Majel GORMAN Ramp, FNP       History of Present Illness  Relevant Aspects of [name] Course:  Admitted on 09/25/2023 with medical history significant of anxiety, depression, chronic back pain, history of cocaine abuse, history of alcohol  abuse, history of opioid use disorder, septic embolism, spinal epidural abscess with peristasis of the lower extremities, history of urinary retention, degenerative disc disease, endometriosis with chronic pain, GERD, migraine headaches, obesity who presented to the emergency department with fever and lower extremity swelling.  Psych history of opiate use d/o, severe;  depression, anxiety, and PTSD.  Patient Report:  43 yo female admitted for upper quadrant pain and found to have pneumonia and other medical concerns with an 8 day stay so far with a request to see psychiatry.  She reported her depression was bad with no suicidal ideations, past suicide attempts with several admissions at [facility] for polysubstance use and mental health needs (depression, anxiety).  Her anxiety is really bad, I worry all the time and cannot control it.  She quickly transitioned to the statement, My daughter died here.  I have a lot of PTSD and scared to go to the doctor.  Her daughter was 100 and went into septic shock from a dislocated knee, died suddenly.  Her symptoms increased after this and she gave up my boys to their dad, ages 79 & 21 now.  Prior to admission, she was using about 1 gram of heroin which she contributes to doctors stopping Rx for her three 80 mg of oxys for her back pain related to past trauma with rods in her back.  Past use of cocaine abuse and other substances.  She was successful for 5 years at [facility] on methadone, evidently she started when she was pregnant as she said she was able to be seen quickly.  When she tried recently, the wait was too long and she returned to using heroin.  She would like to return to a methadone.  She is currently living with her boyfriend of 8 years who also uses heroin.  Discussed medications and made changes, see treatment plan.  10/05/2023: The patient reports feeling physically unwell, describing symptoms consistent  with withdrawal, including fatigue and abnormal breathing. She acknowledges that her body feels "off," but denies chest pain, shortness of breath, or dizziness at this time. Despite her discomfort, she reports feeling mentally clear and is aware of her current situation and treatment needs. She expressed a willingness to continue engaging in care and stated that she wants to "get better." The patient reports adherence  to her medications and agrees that they are helping. She denies any suicidal ideation, homicidal ideation, or auditory/visual hallucinations.  Psych ROS:  Depression: bad Anxiety:  real bad Mania (lifetime and current): none Psychosis: (lifetime and current): none  Review of Systems  Constitutional:  Positive for malaise/fatigue.  HENT: Negative.    Eyes: Negative.   Respiratory:  Positive for cough and sputum production.   Cardiovascular: Negative.   Gastrointestinal: Negative.   Genitourinary: Negative.   Musculoskeletal:  Positive for myalgias.  Skin: Negative.   Neurological:  Positive for weakness.  Endo/Heme/Allergies: Negative.   Psychiatric/Behavioral:  Positive for depression and substance abuse. The patient is nervous/anxious.      Psychiatric and Social History  Psychiatric History:  Information collected from patient and chart.  Prev Dx/Sx: MDD, GAD, PTSD Current Psych Provider: none Home Meds (current): none Previous Med Trials: unknown Therapy: none currently  Prior Psych Hospitalization: [facility], several Prior Self Harm: OD in the past Prior Violence: none  Social History:  Legal Hx: denied Living Situation: lives with her boyfriend who also uses heroin  Access to weapons/lethal means: denied   Substance History Long history of polysubstance use including opiates, heroin, cocaine, etc.  Exam Findings  Physical Exam: completed by the MD, reviewed by this practitioner. Vital Signs:  Temp:  [97.7 F (36.5 C)-100.9 F (38.3 C)] 100.9 F (38.3 C) (09/15 1051) Pulse Rate:  [110-123] 123 (09/15 1051) Resp:  [20-34] 20 (09/15 1051) BP: (130-147)/(82-89) 146/82 (09/15 1051) SpO2:  [93 %-100 %] 96 % (09/15 1051) Blood pressure (!) 146/82, pulse (!) 123, temperature (!) 100.9 F (38.3 C), temperature source Oral, resp. rate 20, height 5' 5 (1.651 m), weight 97.5 kg, last menstrual period 08/10/2011, SpO2 96%. Body mass index is 35.77 kg/m.  Physical  Exam  Mental Status Exam: General Appearance: Disheveled  Orientation:  Full (Time, Place, and Person)  Memory:  Immediate;   Fair Recent;   Fair Remote;   Fair  Concentration:  Concentration: Fair and Attention Span: Fair  Recall:  Fair  Attention  Fair  Eye Contact:  Good  Speech:  Clear and Coherent  Language:  Good  Volume:  Normal  Mood: depressed, anxious  Affect:  Congruent  Thought Process:  Coherent  Thought Content:  Logical  Suicidal Thoughts:  No  Homicidal Thoughts:  No  Judgement:  Fair  Insight:  Fair  Psychomotor Activity:  Decreased  Akathisia:  No  Fund of Knowledge:  Fair      Assets:  Housing Leisure Time Resilience Social Support  Cognition:  WNL  ADL's:  Impaired  AIMS (if indicated):        Other History   These have been pulled in through the EMR, reviewed, and updated if appropriate.  Family History:  The patient's family history includes Breast cancer in her mother; Cancer in her mother; Coronary artery disease in her father and maternal grandmother.  Medical History: Past Medical History:  Diagnosis Date   Alcohol  use disorder 01/04/2012   Detox medically managed successfully during inpatient, adequate for discharge      Anxiety  klonipin for stress disorder   Chronic back pain    Cocaine abuse (HCC) 07/06/2013   Degenerative disc disease    Depression 05/22/2015   Endometriosis    chronic pain   GERD (gastroesophageal reflux disease)    protonix  evenings   Migraine    Septic embolism (HCC)    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   Urinary retention     Surgical History: Past Surgical History:  Procedure Laterality Date   ABDOMINAL HYSTERECTOMY     APPENDECTOMY     APPLICATION OF WOUND VAC Left 10/28/2019   Procedure: APPLICATION OF WOUND VAC;  Surgeon: Harden Jerona GAILS, MD;  Location: [facility] OR;  Service: Orthopedics;  Laterality: Left;   I & D EXTREMITY Left 10/22/2019   Procedure: IRRIGATION AND DEBRIDEMENT EXTREMITY;  APPLICATION OF WOUND VAC;  Surgeon: Beverley Evalene BIRCH, MD;  Location: [facility] OR;  Service: Orthopedics;  Laterality: Left;   I & D EXTREMITY Left 10/26/2019   Procedure: DEBRIDEMENT LEFT TIBIA;  Surgeon: Harden Jerona GAILS, MD;  [facility] OR;  Service: Orthopedics;  Laterality: Left;  left   I & D EXTREMITY Left 10/28/2019   Procedure: REPEAT DEBRIDEMENT LEFT TIBIA;  Surgeon: Harden Jerona GAILS, MD;  Location: [facility] OR;  Service: Orthopedics;  Laterality: Left;   IR THORACENTESIS ASP PLEURAL SPACE W/IMG GUIDE  10/28/2018   LAMINECTOMY FOR CEREBROSPINAL FLUID LEAK N/A 10/31/2018   Procedure: Thoracic Wound Exploration;  Surgeon: Gillie Duncans, MD;  Location: [facility] OR;  Service: Neurosurgery;  Laterality: N/A;   LAPAROSCOPIC ASSISTED VAGINAL HYSTERECTOMY  08/19/2011   Procedure: LAPAROSCOPIC ASSISTED VAGINAL HYSTERECTOMY;  Surgeon: Peggye Gull, MD;  Location: [facility] ORS;  Service: Gynecology;  Laterality: N/A;   TEE WITHOUT CARDIOVERSION N/A 10/25/2018   Procedure: TRANSESOPHAGEAL ECHOCARDIOGRAM (TEE);  Surgeon: Delford Maude BROCKS, MD;  [facility] ENDOSCOPY;  Service: Cardiovascular;  Laterality: N/A;   THORACIC LAMINECTOMY FOR EPIDURAL ABSCESS N/A 10/20/2018   Procedure: THORACIC Eight, Thoracic nine LAMINECTOMY FOR EPIDURAL ABSCESS;  Surgeon: Joshua Alm RAMAN, MD;  Location: [facility] OR;  Service: Neurosurgery;  Laterality: N/A;   TONSILLECTOMY     TUBAL LIGATION       Medications:   Current Facility-Administered Medications:    acetaminophen  (TYLENOL ) tablet 650 mg, 650 mg, Oral, Q4H PRN, Girguis, David, MD, 650 mg at 10/03/23 1353   albuterol  (PROVENTIL ) (2.5 MG/3ML) 0.083% nebulizer solution 2.5 mg, 2.5 mg, Nebulization, Q4H PRN, Celinda Alm Lot, MD, 2.5 mg at 09/30/23 1358   cefadroxil  (DURICEF) capsule 500 mg, 500 mg, Oral, BID, Mannam, Praveen, MD, 500 mg at 10/05/23 1022   Chlorhexidine  Gluconate Cloth 2 % PADS 6 each, 6 each, Topical, Q2200, Mannam, Praveen, MD, 6 each at 10/03/23 2301   DULoxetine  (CYMBALTA ) DR  capsule 20 mg, 20 mg, Oral, BID, Starkes-Perry, Zanyah Lentsch S, FNP   gabapentin  (NEURONTIN ) capsule 100 mg, 100 mg, Oral, TID, Lord, Jamison Y, NP, 100 mg at 10/05/23 1022   ibuprofen  (ADVIL ) tablet 400 mg, 400 mg, Oral, Q6H PRN, Girguis, David, MD   influenza vac split trivalent PF (FLUZONE ) injection 0.5 mL, 0.5 mL, Intramuscular, Tomorrow-1000, Celinda Alm Lot, MD   LORazepam  (ATIVAN ) tablet 1 mg, 1 mg, Oral, Q6H PRN, Jillian, Amrit, MD, 1 mg at 10/05/23 1319   melatonin tablet 5 mg, 5 mg, Oral, QHS PRN, Mannam, Praveen, MD, 5 mg at 10/02/23 0118   nicotine  (NICODERM CQ  - dosed in mg/24 hours) patch 21 mg, 21 mg, Transdermal, Daily PRN, Celinda Alm Lot, MD, 21 mg at 10/04/23 2250  Oral care mouth rinse, 15 mL, Mouth Rinse, PRN, Celinda Alm Lot, MD   oxyCODONE  (Oxy IR/ROXICODONE ) immediate release tablet 5 mg, 5 mg, Oral, Q4H PRN, Girguis, David, MD, 5 mg at 10/05/23 1022   prochlorperazine  (COMPAZINE ) injection 10 mg, 10 mg, Intravenous, Q6H PRN, Celinda Alm Lot, MD, 10 mg at 10/05/23 1319   ramelteon  (ROZEREM ) tablet 8 mg, 8 mg, Oral, QHS, Lord, Jamison Y, NP, 8 mg at 10/04/23 2245   sodium chloride  (OCEAN) 0.65 % nasal spray 1 spray, 1 spray, Each Nare, PRN, Mannam, Praveen, MD, 1 spray at 10/05/23 0354   sodium chloride  flush (NS) 0.9 % injection 10-40 mL, 10-40 mL, Intracatheter, PRN, Jillian Buttery, MD  Allergies: Allergies  Allergen Reactions   Epidural Tray [id number] [Nerve Block Tray] Other (See Comments)    Paralysis and severe pain in head/neck/shoulders.  No anaphylaxis.   Ibuprofen  Hives   Penicillins Hives     Tolerated Cephalosporin 10/22/2019.   Pt has tolerated cephalosporins on past admissions. Has patient had a PCN reaction causing immediate rash, facial/tongue/throat swelling, SOB or lightheadedness with hypotension: Yes Has patient had a PCN reaction causing severe rash involving mucus membranes or skin necrosis: No Has patient had a PCN reaction that  required hospitalization No Has patient had a PCN reaction occurring within the last 10 years: No If all of the above answers are NO, then may   Zofran  [Ondansetron  Hcl] Nausea And Vomiting    Majel GORMAN Ramp, FNP

## 2023-10-05 NOTE — TOC Progression Note (Signed)
 Transition of Care [facility]) - Progression Note    Patient Details  Name: [name] [name] MRN: [id number] Date of Birth: April 14, 1980  Transition of Care [facility]) CM/SW Contact  Shelly Spenser, Nathanel, RN Phone Number: 10/05/2023, 11:19 AM  Clinical Narrative:  Noted [employer] rep Jermaine has already delivered home 02 to rm prior d/c. D/c plan home.    Expected Discharge Plan: Home/Self Care Barriers to Discharge: Continued Medical Work up               Expected Discharge Plan and Services In-house Referral: NA Discharge Planning Services: CM Consult Post Acute Care Choice: Durable Medical Equipment Living arrangements for the past 2 months: Single Family Home                 DME Arranged: Oxygen DME Agency: [employer] Date DME Agency Contacted: 10/05/23 Time DME Agency Contacted: 1119 Representative spoke with at DME Agency: [place] HH Arranged: NA HH Agency: NA         Social Drivers of Health (SDOH) Interventions SDOH Screenings   Food Insecurity: No Food Insecurity (09/26/2023)  Housing: Patient Declined (09/26/2023)  Transportation Needs: No Transportation Needs (09/26/2023)  Utilities: Not At Risk (09/26/2023)  Depression (PHQ2-9): Medium Risk (04/06/2018)  Tobacco Use: High Risk (09/27/2023)    Readmission Risk Interventions    09/28/2023    3:19 PM  Readmission Risk Prevention Plan  Transportation Screening Complete  PCP or Specialist Appt within 5-7 Days Complete  Home Care Screening Complete  Medication Review (RN CM) Complete

## 2023-10-06 ENCOUNTER — Other Ambulatory Visit (HOSPITAL_COMMUNITY): Payer: Self-pay

## 2023-10-06 DIAGNOSIS — F329 Major depressive disorder, single episode, unspecified: Secondary | ICD-10-CM | POA: Diagnosis not present

## 2023-10-06 DIAGNOSIS — F1910 Other psychoactive substance abuse, uncomplicated: Secondary | ICD-10-CM | POA: Diagnosis not present

## 2023-10-06 DIAGNOSIS — J9601 Acute respiratory failure with hypoxia: Secondary | ICD-10-CM | POA: Diagnosis not present

## 2023-10-06 DIAGNOSIS — L03115 Cellulitis of right lower limb: Secondary | ICD-10-CM | POA: Diagnosis not present

## 2023-10-06 DIAGNOSIS — F1120 Opioid dependence, uncomplicated: Secondary | ICD-10-CM | POA: Diagnosis not present

## 2023-10-06 DIAGNOSIS — F411 Generalized anxiety disorder: Secondary | ICD-10-CM | POA: Diagnosis not present

## 2023-10-06 DIAGNOSIS — J189 Pneumonia, unspecified organism: Secondary | ICD-10-CM | POA: Diagnosis not present

## 2023-10-06 DIAGNOSIS — B999 Unspecified infectious disease: Secondary | ICD-10-CM

## 2023-10-06 LAB — COMPREHENSIVE METABOLIC PANEL WITH GFR
ALT: 18 U/L (ref 0–44)
AST: 64 U/L — ABNORMAL HIGH (ref 15–41)
Albumin: 2.8 g/dL — ABNORMAL LOW (ref 3.5–5.0)
Alkaline Phosphatase: 527 U/L — ABNORMAL HIGH (ref 38–126)
Anion gap: 7 (ref 5–15)
BUN: <5 mg/dL — ABNORMAL LOW (ref 6–20)
CO2: 27 mmol/L (ref 22–32)
Calcium: 8.3 mg/dL — ABNORMAL LOW (ref 8.9–10.3)
Chloride: 102 mmol/L (ref 98–111)
Creatinine, Ser: 0.43 mg/dL — ABNORMAL LOW (ref 0.44–1.00)
GFR, Estimated: >60 mL/min (ref >60)
Glucose, Bld: 126 mg/dL — ABNORMAL HIGH (ref 70–99)
Potassium: 3.2 mmol/L — ABNORMAL LOW (ref 3.5–5.1)
Sodium: 136 mmol/L (ref 135–145)
Total Bilirubin: 1.1 mg/dL (ref 0.0–1.2)
Total Protein: 7.3 g/dL (ref 6.5–8.1)

## 2023-10-06 LAB — CBC WITH DIFFERENTIAL/PLATELET
Abs Immature Granulocytes: 0.43 K/uL — ABNORMAL HIGH (ref 0.00–0.07)
Basophils Absolute: 0 K/uL (ref 0.0–0.1)
Basophils Relative: 0 %
Eosinophils Absolute: 0.1 K/uL (ref 0.0–0.5)
Eosinophils Relative: 1 %
HCT: 28.8 % — ABNORMAL LOW (ref 36.0–46.0)
Hemoglobin: 8.3 g/dL — ABNORMAL LOW (ref 12.0–15.0)
Immature Granulocytes: 5 %
Lymphocytes Relative: 17 %
Lymphs Abs: 1.4 K/uL (ref 0.7–4.0)
MCH: 25.2 pg — ABNORMAL LOW (ref 26.0–34.0)
MCHC: 28.8 g/dL — ABNORMAL LOW (ref 30.0–36.0)
MCV: 87.3 fL (ref 80.0–100.0)
Monocytes Absolute: 0.6 K/uL (ref 0.1–1.0)
Monocytes Relative: 8 %
Neutro Abs: 5.6 K/uL (ref 1.7–7.7)
Neutrophils Relative %: 69 %
Platelets: 318 K/uL (ref 150–400)
RBC: 3.3 MIL/uL — ABNORMAL LOW (ref 3.87–5.11)
RDW: 19.9 % — ABNORMAL HIGH (ref 11.5–15.5)
WBC: 8.2 K/uL (ref 4.0–10.5)
nRBC: 0.4 % — ABNORMAL HIGH (ref 0.0–0.2)

## 2023-10-06 LAB — MAGNESIUM: Magnesium: 2.3 mg/dL (ref 1.7–2.4)

## 2023-10-06 MED ORDER — DULOXETINE HCL 20 MG PO CPEP
20.0000 mg | ORAL_CAPSULE | Freq: Two times a day (BID) | ORAL | 3 refills | Status: DC
  Filled 2023-10-06: qty 30, 15d supply, fill #0
  Filled 2023-10-06: qty 60, 30d supply, fill #0

## 2023-10-06 MED ORDER — POTASSIUM CHLORIDE CRYS ER 20 MEQ PO TBCR
40.0000 meq | EXTENDED_RELEASE_TABLET | Freq: Once | ORAL | Status: AC
  Administered 2023-10-06: 40 meq via ORAL
  Filled 2023-10-06: qty 2

## 2023-10-06 MED ORDER — PREDNISONE 20 MG PO TABS
40.0000 mg | ORAL_TABLET | Freq: Every day | ORAL | 0 refills | Status: DC
  Filled 2023-10-06: qty 10, 5d supply, fill #0

## 2023-10-06 NOTE — Progress Notes (Signed)
 Pt expressed that she was waiting for her boyfriend, which whom she lives with, will not be home until after he gets off work. Pt expressed that he is also not answering his phone as far as confirming someone will be home to let her in since she is discharging via safe transport. Pt said around 2-2:30 should be when he gets off work. Pt then expressed that no one else will answer their phones that have access to the house because they are at work. This RN attempted to call number on chart for boyfriend and no voicemail is set up; no ring heard as well. Was suggested to pt do be taken to shelter until family can get to her but pt expressed that her boyfriends dad will be home to let her in.

## 2023-10-06 NOTE — Progress Notes (Deleted)
 Duplicate SATURATION QUALIFICATIONS: (This note is used to comply with regulatory documentation for home oxygen) Patient Saturations on Room Air at Rest = 88%   Patient Saturations on Room Air while Ambulating = 85%   Patient Saturations on 5 Liters of oxygen at Rest = 98%   Patient Saturations on 4 Liters of oxygen while Ambulating = 93%   Patient Saturations on 4 Liters of oxygen post ambulation = 92%

## 2023-10-06 NOTE — Progress Notes (Signed)
 Pt was escorted out to safe transport with belongings and home o2 and expressed that she was ready to light her cigarette as soon as she was outside the building. Education was given on the avoidance of smoking on hospital property along with being on o2. Pt expressed that she was going to turn her o2 off while smoking.

## 2023-10-06 NOTE — Discharge Summary (Signed)
 Physician Discharge Summary   [name] [name] FMW:[id number] DOB: 17-Jun-1980 DOA: 09/25/2023  PCP: Freddrick, No  Admit date: 09/25/2023 Discharge date: 10/06/2023  Admitted From: Home Disposition:  Home Discharging physician: Alm Apo, MD Barriers to discharge: none  Recommendations at discharge: Encourage ongoing smoking cessation Encourage abstinence from substance abuse   Equipment/Devices: Home O2  Discharge Condition: stable CODE STATUS: Full  Diet recommendation:  Diet Orders (From admission, onward)     Start     Ordered   10/06/23 0000  Diet general        10/06/23 1128   09/27/23 1617  Diet regular Room service appropriate? Yes; Fluid consistency: Thin  Diet effective now       Question Answer Comment  Room service appropriate? Yes   Fluid consistency: Thin      09/27/23 1616            Hospital Course: Ms. Berland is a 43 yo female with PMH polysubstance abuse, depression/anxiety, septic emboli, necrotizing fasciitis of leg, spinal abscess. She originally presented with complaints of RUQ abdominal pain.  She underwent workup for infection and ultimately CT chest showed multifocal pneumonia and RUQ showed concern for cholecystitis however HIDA scan was normal. She also underwent consultation with ID during hospitalization. She was treated for opioid withdrawal with Precedex  drip in the ICU, required pressor support, and maintained on high flow oxygen for respiratory failure. UDS on admission was positive for amphetamines and opiates.  She endorsed use of fentanyl  routinely prior to hospitalization.  Assessment and Plan: * Acute respiratory failure with hypoxia (HCC) - due to PNA, see separate problem - Oxygenation has improved and ambulating well on 4 L with saturation is 92-93% - Stable for going home with oxygen and further weaning; have encouraged her to continue cessation of smoking for now at least as well  CAP (community acquired pneumonia) - CT angio  chest negative for PE; shows diffuse patchy and interstitial opacities throughout both lungs concerning for multifocal pneumonia -Has completed antibiotic course and been followed by ID as well - Remains on cefadroxil  for cellulitis treatment  Cellulitis of both lower extremities - treated with vanc/cefepime /flagyl  briefly and now on cefadroxil  to complete course - 2-week course recommended total per ID  Substance abuse (HCC) - Patient endorses to using fentanyl  on the street -UDS positive for amphetamine and opiates on admission - treated with precedex  in ICU and completed withdrawal; any further requests for IV opioids is behavior consistent with dependence and not withdrawal  Drug-seeking behavior - Patient very manipulative with staff throughout hospitalization - Medical care will continue appropriately and treating medical issues as deemed necessary and appropriately; escalation of opioids only as deemed medically necessary -Fentanyl  discontinued; she is able to have oxycodone  for pain which is deemed appropriate management at this time  Infectious process - multiple other workup performed given complexity of admission is summarized below: - HCV acute ruled out; HCV RNA is negative. 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  Deferred to ID -CMV ruled out, DNA quantitative negative -GC chlamydia negative -EBV DNA quantitative PCR positive; appears she may have positive infection.  Supportive care regardless  Elevated LFTs - Hepatic steatosis noted on CT imaging -LFTs significantly elevated on admission which have been downtrending; etiology possibly in setting of cholestasis of sepsis; no mention of biliary dilatation on  imaging  Protein-calorie malnutrition, severe (HCC) - Suspect poor  nutrition in setting of illicit drug use ongoing outpatient and poor personal care -Continue regular diet  Normocytic anemia - No bleeding appreciated -Likely bone marrow suppression from acute illness -Continue trending CBC  MDD (major depressive disorder), recurrent severe, without psychosis (HCC) - no home meds - psych eval requested per patient; seen on 10/04/2023 - Patient started on Cymbalta  for depression, gabapentin  for GAD, Rozerem  for insomnia  Contamination of blood culture-resolved as of 10/03/2023 - Blood culture from 09/25/2023 noted with staph epi (only 1 set drawn, 2/2 positive) -repeat culture on 09/27/2023 remaining negative x 5 days -Cultures deemed likely contaminant after evaluation with ID previously also  Thrombocytopenia (HCC)-resolved as of 10/03/2023 - likely from acute illness - now improved   Hyponatremia-resolved as of 10/03/2023 - improved s/p IVF  RUQ pain-resolved as of 10/03/2023 - RUQ ultrasound was equivocal for acute cholecystitis; HIDA scan performed on 09/27/2023 and was normal    The patient's acute and chronic medical conditions were treated accordingly. On day of discharge, patient was felt deemed stable for discharge. Patient/family member advised to call PCP or come back to ER if needed.   Principal Diagnosis: Acute respiratory failure with hypoxia [facility])  Discharge Diagnoses: Active Hospital Problems   Diagnosis Date Noted   Acute respiratory failure with hypoxia (HCC) 09/29/2023    Priority: 1.   CAP (community acquired pneumonia) 09/30/2023    Priority: 1.   Cellulitis of both lower extremities 09/26/2023    Priority: 2.   Substance abuse (HCC) 10/21/2019    Priority: 3.   Drug-seeking behavior 10/03/2023    Priority: 4.   Infectious process 10/03/2023    Priority: 5.   Elevated LFTs 09/26/2023    Priority: 6.   Normocytic anemia 09/26/2023   Protein-calorie malnutrition, severe (HCC) 09/26/2023   MDD (major depressive disorder),  recurrent severe, 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    Resolved Hospital Problems   Diagnosis Date Noted Date Resolved   Contamination of blood culture 10/03/2023 10/03/2023   Hyponatremia 09/26/2023 10/03/2023   Thrombocytopenia (HCC) 09/26/2023 10/03/2023   RUQ pain 04/06/2018 10/03/2023     Discharge Instructions     Diet general   Complete by: As directed    Discharge wound care:   Complete by: As directed    Continue dry dressings to lower extremity wounds   Increase activity slowly   Complete by: As directed       Allergies as of 10/06/2023       Reactions   Epidural Tray 17gx3-1-2 [nerve Block Tray] Other (See Comments)   Paralysis and severe pain in head/neck/shoulders.  No anaphylaxis.   Ibuprofen  Hives   Penicillins Hives   Tolerated Cephalosporin 10/22/2019. Pt has tolerated cephalosporins on past admissions. Has patient had a PCN reaction causing immediate rash, facial/tongue/throat swelling, SOB or lightheadedness with hypotension: Yes Has patient had a PCN reaction causing severe rash involving mucus membranes or skin necrosis: No Has patient had a PCN reaction that required hospitalization No Has patient had a PCN reaction occurring within the last 10 years: No If all of the above answers are NO, then may   Zofran  [ondansetron  Hcl] Nausea And Vomiting        Medication List     TAKE these medications    cefadroxil  500 MG capsule Commonly known as: DURICEF Take 1 capsule (500 mg total) by mouth 2 (two) times daily for 4 days.   DULoxetine  20 MG capsule  Commonly known as: CYMBALTA  Take 1 capsule (20 mg total) by mouth 2 (two) times daily.   gabapentin  100 MG capsule Commonly known as: NEURONTIN  Take 1 capsule (100 mg total) by mouth 3 (three) times daily.   predniSONE  20 MG tablet Commonly known as: DELTASONE  Take 2 tablets (40 mg total) by mouth daily with breakfast for 5 days.   ramelteon  8 MG tablet Commonly known as: ROZEREM  Take 1  tablet (8 mg total) by mouth at bedtime.               Durable Medical Equipment  (From admission, onward)           Start     Ordered   10/03/23 1353  For home use only DME oxygen  Once       Question Answer Comment  Length of Need 6 Months   Mode or (Route) Nasal cannula   Liters per Minute 5   Frequency Continuous (stationary and portable oxygen unit needed)   Oxygen conserving device Yes   Oxygen delivery system Gas      10/03/23 1352              Discharge Care Instructions  (From admission, onward)           Start     Ordered   10/06/23 0000  Discharge wound care:       Comments: Continue dry dressings to lower extremity wounds   10/06/23 1128            Follow-up Information     [facility] Follow up.   Contact information: [facility]) [address] [place], [place] [address] [phone]        primary care physician. Schedule an appointment as soon as possible for a visit.   Why: contact your case worker through [place] med[employer] insurance @ [phone] since you already have a primary care physician.               Allergies  Allergen Reactions   Epidural Tray 17gx3-1-2 [Nerve Block Tray] Other (See Comments)    Paralysis and severe pain in head/neck/shoulders.  No anaphylaxis.   Ibuprofen  Hives   Penicillins Hives     Tolerated Cephalosporin 10/22/2019.   Pt has tolerated cephalosporins on past admissions. Has patient had a PCN reaction causing immediate rash, facial/tongue/throat swelling, SOB or lightheadedness with hypotension: Yes Has patient had a PCN reaction causing severe rash involving mucus membranes or skin necrosis: No Has patient had a PCN reaction that required hospitalization No Has patient had a PCN reaction occurring within the last 10 years: No If all of the above answers are NO, then may   Zofran  [Ondansetron  Hcl] Nausea And Vomiting     Discharge Exam: BP (!) 148/94 (BP  Location: Left Arm)   Pulse (!) 121   Temp 98.2 F (36.8 C) (Oral)   Resp (!) 24   Ht 5' 5 (1.651 m)   Wt 97.5 kg   LMP 08/10/2011   SpO2 99%   BMI 35.77 kg/m  Physical Exam Constitutional:      Comments: Extremely disheveled and unkempt appearing adult woman laying in bed in no actual distress.  Appears somewhat anxious  HENT:     Head: Normocephalic and atraumatic.     Mouth/Throat:     Mouth: Mucous membranes are moist.  Eyes:     Extraocular Movements: Extraocular movements intact.  Cardiovascular:     Rate and  Rhythm: Normal rate and regular rhythm.  Pulmonary:     Effort: Pulmonary effort is normal. No respiratory distress.     Breath sounds: Normal breath sounds. No wheezing.  Abdominal:     General: Bowel sounds are normal. There is no distension.     Palpations: Abdomen is soft.     Tenderness: There is no abdominal tenderness.  Musculoskeletal:        General: Normal range of motion.     Cervical back: Normal range of motion and neck supple.     Right lower leg: Edema present.     Left lower leg: Edema present.  Skin:    General: Skin is warm and dry.     Comments: Scattered skin abrasions throughout; track marks noted on arms  Neurological:     General: No focal deficit present.  Psychiatric:        Mood and Affect: Mood is anxious.      The results of significant diagnostics from this hospitalization (including imaging, microbiology, ancillary and laboratory) are listed below for reference.   Microbiology: Recent Results (from the past 240 hours)  Culture, blood (Routine X 2) w Reflex to ID Panel     Status: None   Collection Time: 09/27/23  9:56 AM   Specimen: BLOOD RIGHT ARM  Result Value Ref Range Status   Specimen Description   Final    BLOOD RIGHT ARM Performed at [facility], [address], [place], [place] [address]    Special Requests   Final    BOTTLES DRAWN AEROBIC AND ANAEROBIC Blood Culture results may not be optimal due to an  inadequate volume of blood received in culture bottles Performed at [facility], [address], [place], [place] [address]    Culture   Final    NO GROWTH 5 DAYS Performed at [facility], [address] [place], [place] [address]    Report Status 10/02/2023 FINAL  Final  Culture, blood (Routine X 2) w Reflex to ID Panel     Status: None   Collection Time: 09/27/23  9:56 AM   Specimen: BLOOD RIGHT HAND  Result Value Ref Range Status   Specimen Description   Final    BLOOD RIGHT HAND Performed at [facility], [address] [place], [place] [address]    Special Requests   Final    BOTTLES DRAWN AEROBIC AND ANAEROBIC Blood Culture results may not be optimal due to an inadequate volume of blood received in culture bottles Performed at [facility], [address], [place], [place] [address]    Culture   Final    NO GROWTH 5 DAYS Performed at [facility], [address] [place], [place] [address]    Report Status 10/02/2023 FINAL  Final  MRSA Next Gen by PCR, Nasal     Status: None   Collection Time: 09/28/23  9:23 AM   Specimen: Nasal Mucosa; Nasal Swab  Result Value Ref Range Status   MRSA by PCR Next Gen NOT DETECTED NOT DETECTED Final    Comment: (NOTE) The GeneXpert MRSA Assay (FDA approved for NASAL specimens only), is one component of a comprehensive MRSA colonization surveillance program. It is not intended to diagnose MRSA infection nor to guide or monitor treatment for MRSA infections. Test performance is not FDA approved in patients less than 44 years old. Performed at [facility], [address] [place], [place]  [address]   Respiratory (~20 pathogens) panel by PCR     Status: None   Collection Time: 09/29/23  8:07 PM   Specimen: Nasopharyngeal Swab; Respiratory  Result Value Ref Range Status   Adenovirus NOT DETECTED NOT DETECTED Final   Coronavirus 229E NOT DETECTED NOT DETECTED  Final    Comment: (NOTE) The Coronavirus on the Respiratory Panel, DOES NOT test for the novel  Coronavirus (2019 nCoV)    Coronavirus HKU1 NOT DETECTED NOT DETECTED Final   Coronavirus NL63 NOT DETECTED NOT DETECTED Final   Coronavirus OC43 NOT DETECTED NOT DETECTED Final   Metapneumovirus NOT DETECTED NOT DETECTED Final   Rhinovirus / Enterovirus NOT DETECTED NOT DETECTED Final   Influenza A NOT DETECTED NOT DETECTED Final   Influenza B NOT DETECTED NOT DETECTED Final   Parainfluenza Virus 1 NOT DETECTED NOT DETECTED Final   Parainfluenza Virus 2 NOT DETECTED NOT DETECTED Final   Parainfluenza Virus 3 NOT DETECTED NOT DETECTED Final   Parainfluenza Virus 4 NOT DETECTED NOT DETECTED Final   Respiratory Syncytial Virus NOT DETECTED NOT DETECTED Final   Bordetella pertussis NOT DETECTED NOT DETECTED Final   Bordetella Parapertussis NOT DETECTED NOT DETECTED Final   Chlamydophila pneumoniae NOT DETECTED NOT DETECTED Final   Mycoplasma pneumoniae NOT DETECTED NOT DETECTED Final    Comment: Performed at [facility], [address] [place], [place] [address]     Labs: BNP (last 3 results) No results for input(s): BNP in the last 8760 hours. Basic Metabolic Panel: Recent Labs  Lab 09/29/23 1939 09/30/23 0321 10/02/23 0752 10/03/23 0437 10/04/23 0630 10/05/23 0235 10/06/23 0003  NA 133*   < > 136 135 137 137 136  K 4.1   < > 3.3* 3.5 3.7 3.5 3.2*  CL 101   < > 99 99 102 103 102  CO2 21*   < > 26 26 27 25 27   GLUCOSE 107*   < > 120* 119* 110* 116* 126*  BUN 15   < > 13 6 <5* <5* <5*  CREATININE 0.68   < > 0.66 0.50 0.50 0.47 0.43*  CALCIUM  8.0*   < > 7.9* 8.2* 8.4* 8.3* 8.3*  MG 2.4  --   --   --  2.4 2.4 2.3  PHOS 2.7  --   --   --   --   --   --    < > = values in this interval not displayed.   Liver Function Tests: Recent Labs  Lab 10/02/23 0752 10/03/23 0437 10/04/23 0630 10/05/23 0235 10/06/23 0003  AST 133* 117* 84* 73* 64*  ALT 29 31 22 21 18    ALKPHOS 1,031* 867* 702* 617* 527*  BILITOT 1.5* 1.6* 1.3* 1.4* 1.1  PROT 7.2 7.2 7.4 7.4 7.3  ALBUMIN  2.3* 2.5* 2.6* 2.7* 2.8*   No results for input(s): LIPASE, AMYLASE in the last 168 hours. No results for input(s): AMMONIA in the last 168 hours. CBC: Recent Labs  Lab 10/02/23 0752 10/03/23 0437 10/04/23 0630 10/05/23 0235 10/06/23 0003  WBC 9.6 10.3 9.6 8.9 8.2  NEUTROABS  --   --  6.8 6.2 5.6  HGB 7.8* 7.6* 7.8* 7.7* 8.3*  HCT 25.4* 25.7* 26.1* 26.8* 28.8*  MCV 83.0 83.4 85.9 86.7 87.3  PLT 253 262 283 301 318   Cardiac Enzymes: No results for input(s): CKTOTAL, CKMB, CKMBINDEX, TROPONINI in the last 168 hours. BNP: Invalid input(s): POCBNP CBG: No results for input(s): GLUCAP in the last  168 hours. D-Dimer No results for input(s): DDIMER in the last 72 hours. Hgb A1c No results for input(s): HGBA1C in the last 72 hours. Lipid Profile No results for input(s): CHOL, HDL, LDLCALC, TRIG, CHOLHDL, LDLDIRECT in the last 72 hours. Thyroid function studies No results for input(s): TSH, T4TOTAL, T3FREE, THYROIDAB in the last 72 hours.  Invalid input(s): FREET3 Anemia work up No results for input(s): VITAMINB12, FOLATE, FERRITIN, TIBC, IRON, RETICCTPCT in the last 72 hours. Urinalysis    Component Value Date/Time   COLORURINE AMBER (A) 09/26/2023 1727   APPEARANCEUR CLEAR 09/26/2023 1727   LABSPEC >1.046 (H) 09/26/2023 1727   PHURINE 5.0 09/26/2023 1727   GLUCOSEU NEGATIVE 09/26/2023 1727   HGBUR SMALL (A) 09/26/2023 1727   BILIRUBINUR NEGATIVE 09/26/2023 1727   KETONESUR NEGATIVE 09/26/2023 1727   PROTEINUR 30 (A) 09/26/2023 1727   UROBILINOGEN 1.0 05/03/2012 2114   NITRITE POSITIVE (A) 09/26/2023 1727   LEUKOCYTESUR NEGATIVE 09/26/2023 1727   Sepsis Labs Recent Labs  Lab 10/03/23 0437 10/04/23 0630 10/05/23 0235 10/06/23 0003  WBC 10.3 9.6 8.9 8.2   Microbiology Recent Results (from the past 240  hours)  Culture, blood (Routine X 2) w Reflex to ID Panel     Status: None   Collection Time: 09/27/23  9:56 AM   Specimen: BLOOD RIGHT ARM  Result Value Ref Range Status   Specimen Description   Final    BLOOD RIGHT ARM Performed at [facility], [name], [place], [place] [address]    Special Requests   Final    BOTTLES DRAWN AEROBIC AND ANAEROBIC Blood Culture results may not be optimal due to an inadequate volume of blood received in culture bottles Performed at [facility], [address] [place], [place] [address]    Culture   Final    NO GROWTH 5 DAYS Performed at [facility], [address], [place], [place] [address]    Report Status 10/02/2023 FINAL  Final  Culture, blood (Routine X 2) w Reflex to ID Panel     Status: None   Collection Time: 09/27/23  9:56 AM   Specimen: BLOOD RIGHT HAND  Result Value Ref Range Status   Specimen Description   Final    BLOOD RIGHT HAND Performed at [facility], [address] [place], [place] [address]    Special Requests   Final    BOTTLES DRAWN AEROBIC AND ANAEROBIC Blood Culture results may not be optimal due to an inadequate volume of blood received in culture bottles Performed at [facility], [address] [place], [place] [address]    Culture   Final    NO GROWTH 5 DAYS Performed at [facility], [address] [place], [place] [address]    Report Status 10/02/2023 FINAL  Final  MRSA Next Gen by PCR, Nasal     Status: None   Collection Time: 09/28/23  9:23 AM   Specimen: Nasal Mucosa; Nasal Swab  Result Value Ref Range Status   MRSA by PCR Next Gen NOT DETECTED NOT DETECTED Final    Comment: (NOTE) The GeneXpert MRSA Assay (FDA approved for NASAL specimens only), is one component of a comprehensive MRSA colonization surveillance program. It is not intended to diagnose MRSA infection nor to guide or monitor treatment for MRSA infections. Test  performance is not FDA approved in patients less than 71 years old. Performed at [facility], [address] [place], [place] [address]   Respiratory (~20 pathogens) panel by PCR     Status: None   Collection Time: 09/29/23  8:07 PM   Specimen: Nasopharyngeal Swab; Respiratory  Result Value Ref Range Status   Adenovirus NOT DETECTED NOT DETECTED Final   Coronavirus 229E NOT DETECTED NOT DETECTED Final    Comment: (NOTE) The Coronavirus on the Respiratory Panel, DOES NOT test for the novel  Coronavirus (2019 nCoV)    Coronavirus HKU1 NOT DETECTED NOT DETECTED Final   Coronavirus NL63 NOT DETECTED NOT DETECTED Final   Coronavirus OC43 NOT DETECTED NOT DETECTED Final   Metapneumovirus NOT DETECTED NOT DETECTED Final   Rhinovirus / Enterovirus NOT DETECTED NOT DETECTED Final   Influenza A NOT DETECTED NOT DETECTED Final   Influenza B NOT DETECTED NOT DETECTED Final   Parainfluenza Virus 1 NOT DETECTED NOT DETECTED Final   Parainfluenza Virus 2 NOT DETECTED NOT DETECTED Final   Parainfluenza Virus 3 NOT DETECTED NOT DETECTED Final   Parainfluenza Virus 4 NOT DETECTED NOT DETECTED Final   Respiratory Syncytial Virus NOT DETECTED NOT DETECTED Final   Bordetella pertussis NOT DETECTED NOT DETECTED Final   Bordetella Parapertussis NOT DETECTED NOT DETECTED Final   Chlamydophila pneumoniae NOT DETECTED NOT DETECTED Final   Mycoplasma pneumoniae NOT DETECTED NOT DETECTED Final    Comment: Performed at [facility], [address] [place], [place] [address]    Procedures/Studies: CT HEAD WO CONTRAST ( ) Result Date: 10/04/2023 EXAM: CT HEAD WITHOUT CONTRAST 10/04/2023 01:45:00 AM TECHNIQUE: CT of the head was performed without the administration of intravenous contrast. Automated exposure control, iterative reconstruction, and/or weight based adjustment of the mA/kV was utilized to reduce the radiation dose to as low as reasonably achievable. COMPARISON: None  available. CLINICAL HISTORY: Unwitnessed fall. 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 If clinically indicated, this could be better assessed with MRI examination. No acute hemorrhage. No evidence of acute infarct, other than the age indeterminate left thalamic lacunar infarct. No hydrocephalus. No extra-axial collection. No mass effect or midline shift. ORBITS: No acute abnormality. SINUSES: No acute abnormality. SOFT TISSUES AND SKULL: No acute soft tissue abnormality. No skull fracture. IMPRESSION: 1. No acute intracranial abnormality. 2. 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 If clinically indicated, this could be better assessed with MRI examination. Electronically signed by: Dorethia Molt MD 10/04/2023 02:03 AM EDT RP Workstation: [id number]   DG CHEST PORT 1 VIEW Result Date: 09/29/2023 CLINICAL DATA:  Respiratory distress EXAM: PORTABLE CHEST 1 VIEW COMPARISON:  09/28/2023 FINDINGS: Low lung volumes. Severe diffuse bilateral airspace disease, unchanged. Heart and mediastinal contours stable. IMPRESSION: Low lung volumes with severe diffuse bilateral airspace disease, stable. Electronically Signed   By: Franky Crease M.D.   On: 09/29/2023 18:07   ECHOCARDIOGRAM COMPLETE Result Date: 09/28/2023    ECHOCARDIOGRAM REPORT   Patient Name:   [name] [name] Date of Exam: 09/28/2023 Medical Rec #:  [id number]     Height:       65.0 in Accession #:    [id number]    Weight:       240.5 lb Date of Birth:  1980-07-28     BSA:          2.139 m Patient Age:    43 years      BP:           127/87 mmHg Patient Gender: F             HR:  112 bpm. Exam Location:  Inpatient Procedure: 2D Echo, Cardiac Doppler and Color Doppler (Both Spectral and Color            Flow Doppler were utilized during procedure). Indications:    Endocarditis  History:        Patient has prior history of Echocardiogram examinations, most                 recent 10/25/2018.  Signs/Symptoms:Bacteremia; Risk Factors:IVDU,                 polysubstance abuse.  Sonographer:    Koleen Popper RDCS Referring Phys: [id number] Emmily Pellegrin P FITZGERALD IMPRESSIONS  1. Left ventricular ejection fraction, by estimation, is 60 to 65%. The left ventricle has normal function. The left ventricle has no regional wall motion abnormalities. Left ventricular diastolic parameters were normal.  2. Right ventricular systolic function is normal. The right ventricular size is normal. There is mildly elevated pulmonary artery systolic pressure.  3. Left atrial size was mildly dilated.  4. The mitral valve is abnormal. Trivial mitral valve regurgitation. No evidence of mitral stenosis.  5. The aortic valve is tricuspid. There is mild calcification of the aortic valve. Aortic valve regurgitation is not visualized. Aortic valve sclerosis is present, with no evidence of aortic valve stenosis.  6. The inferior vena cava is normal in size with greater than 50% respiratory variability, suggesting right atrial pressure of 3 mmHg. FINDINGS  Left Ventricle: Left ventricular ejection fraction, by estimation, is 60 to 65%. The left ventricle has normal function. The left ventricle has no regional wall motion abnormalities. Strain was performed and the global longitudinal strain is indeterminate. The left ventricular internal cavity size was normal in size. There is no left ventricular hypertrophy. Left ventricular diastolic parameters were normal. Right Ventricle: The right ventricular size is normal. No increase in right ventricular wall thickness. Right ventricular systolic function is normal. There is mildly elevated pulmonary artery systolic pressure. The tricuspid regurgitant velocity is 2.99  m/s, and with an assumed right atrial pressure of 3 mmHg, the estimated right ventricular systolic pressure is 38.8 mmHg. Left Atrium: Left atrial size was mildly dilated. Right Atrium: Right atrial size was normal in size. Pericardium:  There is no evidence of pericardial effusion. Mitral Valve: The mitral valve is abnormal. There is mild thickening of the mitral valve leaflet(s). Mild mitral annular calcification. Trivial mitral valve regurgitation. No evidence of mitral valve stenosis. There is no evidence of mitral valve vegetation. Tricuspid Valve: The tricuspid valve is normal in structure. Tricuspid valve regurgitation is mild . No evidence of tricuspid stenosis. There is no evidence of tricuspid valve vegetation. Aortic Valve: The aortic valve is tricuspid. There is mild calcification of the aortic valve. Aortic valve regurgitation is not visualized. Aortic valve sclerosis is present, with no evidence of aortic valve stenosis. There is no evidence of aortic valve  vegetation. Pulmonic Valve: The pulmonic valve was not well visualized. Pulmonic valve regurgitation is trivial. No evidence of pulmonic stenosis. There is no evidence of pulmonic valve vegetation. Aorta: The aortic root is normal in size and structure. Venous: The inferior vena cava is normal in size with greater than 50% respiratory variability, suggesting right atrial pressure of 3 mmHg. IAS/Shunts: The interatrial septum was not well visualized. Additional Comments: 3D was performed not requiring image post processing on an independent workstation and was indeterminate.  LEFT VENTRICLE PLAX 2D LVIDd:         4.20 cm  Diastology LVIDs:         2.60 cm   LV e' medial: 4.57 cm/s LV PW:         0.90 cm LV IVS:        0.90 cm LVOT diam:     1.90 cm LV SV:         52 LV SV Index:   25 LVOT Area:     2.84 cm  RIGHT VENTRICLE             IVC RV S prime:     14.20 cm/s  IVC diam: 1.30 cm TAPSE (M-mode): 2.9 cm LEFT ATRIUM             Index LA diam:        3.80 cm 1.78 cm/m LA Vol (A2C):   52.7 ml 24.63 ml/m LA Vol (A4C):   60.3 ml 28.18 ml/m LA Biplane Vol: 59.0 ml 27.58 ml/m  AORTIC VALVE LVOT Vmax:   122.00 cm/s LVOT Vmean:  83.900 cm/s LVOT VTI:    0.185 m  AORTA Ao Root diam:  2.70 cm Ao Asc diam:  2.70 cm TRICUSPID VALVE TR Peak grad:   35.8 mmHg TR Vmax:        299.00 cm/s  SHUNTS Systemic VTI:  0.18 m Systemic Diam: 1.90 cm Maude Emmer MD Electronically signed by Maude Emmer MD Signature Date/Time: 09/28/2023/2:23:41 PM    Final    DG CHEST PORT 1 VIEW Result Date: 09/28/2023 CLINICAL DATA:  Shortness of breath. EXAM: PORTABLE CHEST 1 VIEW COMPARISON:  09/25/2023. FINDINGS: Low lung volume. There are markedly increased alveolar and interstitial markings throughout bilateral lungs. Findings are nonspecific and differential diagnosis includes congestive heart failure/pulmonary edema, ARDS or multilobar pneumonia. Correlate clinically. Bilateral costophrenic angles are clear. Stable cardio-mediastinal silhouette. No acute osseous abnormalities. The soft tissues are within normal limits. IMPRESSION: *Markedly increased alveolar and interstitial markings throughout bilateral lungs. Findings are nonspecific and differential diagnosis includes congestive heart failure/pulmonary edema, ARDS or multilobar pneumonia. Electronically Signed   By: Ree Molt M.D.   On: 09/28/2023 08:26   VAS US  LOWER EXTREMITY VENOUS (DVT) Result Date: 09/27/2023  Lower Venous DVT Study Patient Name:  REALITY DEJONGE  Date of Exam:   09/26/2023 Medical Rec #: [id number]      Accession #:    [id number] Date of Birth: 08/16/1980      Patient Gender: F Patient Age:   21 years Exam Location:  [facility] Procedure:      VAS US  LOWER EXTREMITY VENOUS (DVT) Referring Phys: Grayce Budden ORTIZ --------------------------------------------------------------------------------  Indications: Swelling, Pain, Edema, and history of necrotizing fascitis.  Limitations: Body habitus and poor ultrasound/tissue interface. Comparison Study: No prior exam. Performing Technologist: Edilia Elden Appl  Examination Guidelines: A complete evaluation includes B-mode imaging, spectral Doppler, color Doppler, and power Doppler as needed of  all accessible portions of each vessel. Bilateral testing is considered an integral part of a complete examination. Limited examinations for reoccurring indications may be performed as noted. The reflux portion of the exam is performed with the patient in reverse Trendelenburg.  +---------+---------------+---------+-----------+----------+-------------------+ RIGHT    CompressibilityPhasicitySpontaneityPropertiesThrombus Aging      +---------+---------------+---------+-----------+----------+-------------------+ CFV      Full           Yes      Yes                                      +---------+---------------+---------+-----------+----------+-------------------+  SFJ      Full           Yes      Yes                                      +---------+---------------+---------+-----------+----------+-------------------+ FV Prox  Full                                                             +---------+---------------+---------+-----------+----------+-------------------+ FV Mid   Full                                                             +---------+---------------+---------+-----------+----------+-------------------+ FV DistalFull                                                             +---------+---------------+---------+-----------+----------+-------------------+ PFV      Full                                                             +---------+---------------+---------+-----------+----------+-------------------+ POP      Full           Yes      Yes                                      +---------+---------------+---------+-----------+----------+-------------------+ PTV                                                   Not well visualized +---------+---------------+---------+-----------+----------+-------------------+ PERO     Full                                                              +---------+---------------+---------+-----------+----------+-------------------+ Right posterior tibial veins not visualized on this exam.  +---------+---------------+---------+-----------+----------+--------------+ LEFT     CompressibilityPhasicitySpontaneityPropertiesThrombus Aging +---------+---------------+---------+-----------+----------+--------------+ CFV      Full           Yes      Yes                                 +---------+---------------+---------+-----------+----------+--------------+ SFJ      Full  Yes      Yes                                 +---------+---------------+---------+-----------+----------+--------------+ FV Prox  Full                                                        +---------+---------------+---------+-----------+----------+--------------+ FV Mid   Full                                                        +---------+---------------+---------+-----------+----------+--------------+ FV DistalFull                                                        +---------+---------------+---------+-----------+----------+--------------+ PFV      Full                                                        +---------+---------------+---------+-----------+----------+--------------+ POP      Full           Yes      Yes                                 +---------+---------------+---------+-----------+----------+--------------+ PTV      Full                                                        +---------+---------------+---------+-----------+----------+--------------+ PERO     Full                                                        +---------+---------------+---------+-----------+----------+--------------+     Summary: RIGHT: - There is no evidence of deep vein thrombosis in the lower extremity. However, portions of this examination were limited- see technologist comments above.  - No cystic structure found in the  popliteal fossa.  LEFT: - There is no evidence of deep vein thrombosis in the lower extremity.  - No cystic structure found in the popliteal fossa.  *See table(s) above for measurements and observations. Electronically signed by Gaile New MD on 09/27/2023 at 8:25:28 PM.    Final    NM Hepatobiliary Liver Func Result Date: 09/27/2023 CLINICAL DATA:  Right upper quadrant abdominal pain. EXAM: NUCLEAR MEDICINE HEPATOBILIARY IMAGING TECHNIQUE: Sequential images of the abdomen were obtained out to 60 minutes following intravenous administration of radiopharmaceutical. RADIOPHARMACEUTICALS:  7.21 mCi Tc-74m  Choletec   IV COMPARISON:  None Available. FINDINGS: Prompt uptake and biliary excretion of activity by the liver is seen. Gallbladder activity is visualized, consistent with patency of cystic duct. Biliary activity passes into small bowel, consistent with patent common bile duct. IMPRESSION: Normal nuclear medicine hepatobiliary scan. Electronically Signed   By: Suzen Dials M.D.   On: 09/27/2023 16:04   CT Angio Chest Pulmonary Embolism (PE) W or WO Contrast Result Date: 09/26/2023 CLINICAL DATA:  Shortness of breath. Acute bilateral lower extremity swelling, fever. EXAM: CT ANGIOGRAPHY CHEST WITH CONTRAST TECHNIQUE: Multidetector CT imaging of the chest was performed using the standard protocol during bolus administration of intravenous contrast. Multiplanar CT image reconstructions and MIPs were obtained to evaluate the vascular anatomy. RADIATION DOSE REDUCTION: This exam was performed according to the departmental dose-optimization program which includes automated exposure control, adjustment of the mA and/or kV according to patient size and/or use of iterative reconstruction technique. CONTRAST:  75mL OMNIPAQUE  IOHEXOL  350 MG/ML SOLN COMPARISON:  November 02, 2018. FINDINGS: Cardiovascular: Satisfactory opacification of the pulmonary arteries to the segmental level. No evidence of pulmonary embolism.  Normal heart size. No pericardial effusion. Mediastinum/Nodes: No significant mediastinal adenopathy is noted. Mildly enlarged bilateral axillary lymph nodes are noted which most likely are inflammatory or reactive in etiology. Thyroid gland, trachea, and esophagus demonstrate no significant findings. Lungs/Pleura: No pneumothorax or pleural effusion is noted. Diffuse patchy and interstitial opacities are noted throughout both lungs concerning for edema or possibly multifocal pneumonia. Upper Abdomen: Hepatic steatosis. Musculoskeletal: No chest wall abnormality. No acute or significant osseous findings. Review of the MIP images confirms the above findings. IMPRESSION: 1. No definite evidence of pulmonary embolus. 2. Diffuse patchy and interstitial opacities are noted throughout both lungs concerning for edema or possibly multifocal pneumonia. 3. Hepatic steatosis. Electronically Signed   By: Lynwood Landy Raddle M.D.   On: 09/26/2023 14:01   US  Abdomen Limited RUQ (LIVER/GB) Result Date: 09/26/2023 EXAM: Right Upper Quadrant Abdominal Ultrasound TECHNIQUE: Real-time ultrasonography of the right upper quadrant of the abdomen was performed. COMPARISON: None. CLINICAL HISTORY: Right flank discomfort [id number]. FINDINGS: LIVER: The liver demonstrates heterogeneous and increased echogenicity. No intrahepatic biliary ductal dilatation. No mass. BILIARY SYSTEM: No evidence of pericholecystic fluid or wall thickening. No cholelithiasis. Positive sonographic Murphy's sign. Common bile duct mildly dilated measuring 7mm. OTHER: No right upper quadrant ascites. IMPRESSION: 1. Findings are equivocal for acute cholecystitis. A positive sonographic murphy sign was noted by the sonographer and there is mild dilation of the common bile duct. However, there is no cholelithiasis, gallbladder wall thickening, or pericholecystic fluid. Consider HIDA scan for further evaluation. Electronically signed by: Norman Gatlin MD 09/26/2023 02:53 AM  EDT RP Workstation: [id number]   CT ABDOMEN PELVIS W CONTRAST Result Date: 09/26/2023 EXAM: CT ABDOMEN AND PELVIS WITH CONTRAST 09/26/2023 12:58:30 AM TECHNIQUE: CT of the abdomen and pelvis was performed with the administration of intravenous contrast, specifically 100mL of iohexol  (OMNIPAQUE ) 300 MG/ML solution. Multiplanar reformatted images are provided for review. Automated exposure control, iterative reconstruction, and/or weight-based adjustment of the mA/kV was utilized to reduce the radiation dose to as low as reasonably achievable. COMPARISON: Apr 10, 2018 CLINICAL HISTORY: Abdominal pain, acute, nonlocalized. FINDINGS: LOWER CHEST: No acute abnormality. LIVER: Hepatic steatosis. GALLBLADDER AND BILE DUCTS: Gallbladder is unremarkable. No biliary ductal dilatation. SPLEEN: The spleen measures 17.0 cm in craniocaudal dimension and contains a hypoattenuating lesion in the posterior spleen, which is likely a benign cyst or hemangioma. Splenomegaly is present. PANCREAS: No acute abnormality.  ADRENAL GLANDS: No acute abnormality. KIDNEYS, URETERS AND BLADDER: No stones in the kidneys or ureters. No hydronephrosis. No perinephric or periureteral stranding. Urinary bladder is unremarkable. GI AND BOWEL: Stomach demonstrates no acute abnormality. There is no bowel obstruction. PERITONEUM AND RETROPERITONEUM: No ascites.  No free air. VASCULATURE: Aorta is normal in caliber. LYMPH NODES: No lymphadenopathy. REPRODUCTIVE ORGANS: No acute abnormality. BONES AND SOFT TISSUES: No acute osseous abnormality. Edema in the subcutaneous fat of the bilateral flanks with subcutaneous stranding and edema with skin thickening in the subcutaneous fat of the pannus. Additional right greater than left edema and stranding in the subcutaneous fat of the thighs. No focal soft tissue abnormality. IMPRESSION: 1. Nonspecific edema in the subcutaneous fat of the bilateral flanks, pannus, and thighs (right greater than left). Correlate  for cellulitis. 2. Hepatic steatosis. 3. Splenomegaly with a hypoattenuating lesion in the posterior spleen, likely a benign cyst or hemangioma. Electronically signed by: Norman Gatlin MD 09/26/2023 01:13 AM EDT RP Workstation: [id number]   CT EXTREMITY LOWER LEFT W CONTRAST Result Date: 09/26/2023 CLINICAL DATA:  Osteomyelitis suspected, skin graft to left lower extremity. Swelling, fever. EXAM: CT OF THE LOWER LEFT EXTREMITY WITH CONTRAST TECHNIQUE: Multidetector CT imaging of the lower left extremity was performed according to the standard protocol following intravenous contrast administration. RADIATION DOSE REDUCTION: This exam was performed according to the departmental dose-optimization program which includes automated exposure control, adjustment of the mA and/or kV according to patient size and/or use of iterative reconstruction technique. CONTRAST: OMNIPAQUE  IOHEXOL  300 MG/ML  SOLN COMPARISON:  None Available. FINDINGS: Bones/Joint/Cartilage No acute bony abnormality. Specifically, no fracture, subluxation, or dislocation. No bone destruction to suggest osteomyelitis. Small joint effusion within the left knee. Ligaments Suboptimally assessed by CT. Muscles and Tendons Unremarkable Soft tissues Skin staples noted anteriorly within the left shin region presumably related to reported skin graft. Edema within the subcutaneous soft tissues could be postoperative or related to cellulitis. No focal fluid collection. IMPRESSION: No acute bony abnormality. No bone destruction to suggest osteomyelitis. Edema throughout the subcutaneous soft tissues in the left lower extremity from the distal thigh through the calf could be postoperative or related to cellulitis. Electronically Signed   By: Franky Crease M.D.   On: 09/26/2023 01:09   DG Tibia/Fibula Left Result Date: 09/25/2023 CLINICAL DATA:  Skin graft left lower extremity, swelling, fever EXAM: LEFT TIBIA AND FIBULA - 2 VIEW COMPARISON:  None Available.  FINDINGS: Frontal and lateral views of the left lower leg are obtained. Multiple surgical staples are seen within the anterior proximal left lower leg consistent with history of skin graft surgery. There is diffuse subcutaneous edema. No subcutaneous gas. No acute or destructive bony abnormalities. IMPRESSION: 1. Postsurgical changes from skin graft procedure. 2. Extensive subcutaneous edema throughout the visualized left lower leg, which could be postoperative or related to cellulitis. 3. No acute or destructive bony abnormality. Electronically Signed   By: Ozell Daring M.D.   On: 09/25/2023 22:52   DG Chest Port 1 View Result Date: 09/25/2023 CLINICAL DATA:  Short of breath, fever EXAM: PORTABLE CHEST 1 VIEW COMPARISON:  10/21/2019 FINDINGS: The heart size and mediastinal contours are within normal limits. Both lungs are clear. The visualized skeletal structures are unremarkable. IMPRESSION: No active disease. Electronically Signed   By: Ozell Daring M.D.   On: 09/25/2023 22:51     Time coordinating discharge: Over 30 minutes    Alm Apo, MD  [facility] 10/06/2023, 2:15 PM

## 2023-10-06 NOTE — TOC Transition Note (Addendum)
 Transition of Care [facility]) - Discharge Note   Patient Details  Name: [name] [name] MRN: [id number] Date of Birth: 1980/09/24  [facility] of [facility]) CM/SW Contact:  Bascom Service, RN Phone Number: 10/06/2023, 12:03 PM   Clinical Narrative: Patient has home 02-[employer] delivered travel tank to rm;  [place]- patient will transport by [employer](has travel 02). the rider waiver is in the printer for patient to sign. the form goes with patient when [facility] picks her up @ main entrance. Once patient tells u she has a way into the home(no key) I can call [facility] & I will give them ur tel# once they r @ main entrance. -2:36p per nsg patient's Dad will be @ home to receive patient. [employer] [facility] rep Merlynn called-provided all info for ambulatory 02 w/travel tank;main entrance @ [facility]-provided Nurse Natasha tel#[phone] to driver to call for p/u once @ main entrance.No further CM needs.    Final next level of care: Home/Self Care Barriers to Discharge: No Barriers Identified   Patient Goals and CMS Choice Patient states their goals for this hospitalization and ongoing recovery are:: Home CMS Medicare.gov Compare Post Acute Care list provided to:: Patient Choice offered to / list presented to : Patient [employer] ownership interest in [facility].provided to:: Patient    Discharge Placement                       Discharge Plan and Services Additional resources added to the After Visit Summary for   In-house Referral: NA Discharge Planning Services: CM Consult Post Acute Care Choice: Durable Medical Equipment          DME Arranged: Oxygen DME Agency: [employer] Date DME Agency Contacted: 10/05/23 Time DME Agency Contacted: 1119 Representative spoke with at DME Agency: [place] HH Arranged: NA HH Agency: NA        Social Drivers of Health (SDOH) Interventions SDOH Screenings   Food Insecurity: No Food Insecurity (09/26/2023)  Housing:  Patient Declined (09/26/2023)  Transportation Needs: No Transportation Needs (09/26/2023)  Utilities: Not At Risk (09/26/2023)  Depression (PHQ2-9): Medium Risk (04/06/2018)  Tobacco Use: High Risk (09/27/2023)     Readmission Risk Interventions    09/28/2023    3:19 PM  Readmission Risk Prevention Plan  Transportation Screening Complete  PCP or Specialist Appt within 5-7 Days Complete  Home Care Screening Complete  Medication Review (RN CM) Complete

## 2023-10-06 NOTE — Progress Notes (Addendum)
 SATURATION QUALIFICATIONS: (This note is used to comply with regulatory documentation for home oxygen) Patient Saturations on Room Air at Rest = 88%  Patient Saturations on Room Air while Ambulating = 85%  Patient Saturations on 5 Liters of oxygen at Rest = 98%  Patient Saturations on 4 Liters of oxygen while Ambulating = 93%  Patient Saturations on 4 Liters of oxygen post ambulation = 92%

## 2023-10-06 NOTE — Progress Notes (Signed)
 Discharge meds in a secure bag delivered to pt in room by this RN

## 2023-10-06 NOTE — Consult Note (Signed)
 [name] Psychiatric Consult Initial  Patient Name: .Macklyn [name]  MRN: [id number]  DOB: 22-Apr-1980  Consult Order details:  Orders (From admission, onward)     Start     Ordered   10/03/23 1607  IP CONSULT TO PSYCHIATRY       Ordering Provider: Patsy Lenis, MD  Provider:  (Not yet assigned)  Question Answer Comment  Location [facility]   Reason for Consult? patient request; PTSD, depression, opioid dependence      10/03/23 1607             Mode of Visit: In person    Psychiatry Consult Evaluation  Service Date: October 06, 2023 LOS:  LOS: 10 days  Chief Complaint I don't sleep more than 20 minutes at a time.  Primary Psychiatric Diagnoses  Opiate use dependence, severe 2.  MDD 3.  GAD  Assessment  [name] [name] is a 43 y.o. female admitted: Medicallyfor 09/25/2023  8:32 PM with medical history significant of anxiety, depression, chronic back pain, history of cocaine abuse, history of alcohol  abuse, history of opioid use disorder, septic embolism, spinal epidural abscess with peristasis of the lower extremities, history of urinary retention, degenerative disc disease, endometriosis with chronic pain, GERD, migraine headaches, obesity who presented to the emergency department with fever and lower extremity swelling.  Psych history of opiate use d/o, severe; depression, anxiety, and PTSD.  Her current presentation of dysphoric mood, decreased in motivation, low energy, grief and self-medicating with opiates is most consistent with MDD.  Current outpatient psychotropic medications include none.  On initial examination, patient was very anxious and having difficulty breathing even with oxygen. Please see plan below for detailed recommendations.   09/16: Patient openly admits to using approximately 1 gram of fentanyl  daily. Despite having an elevated Mewes score last night and today during the assessment, she continues to answer questions appropriately  and remains alert and oriented. The patient is aware of her withdrawal symptoms and describes experiencing chills, nausea, vomiting, diarrhea, and muscle aches, which she attributes to her opioid withdrawal. She reports that her room is frigid, which may also contribute to some of these symptoms. She denies any suicidal ideation or homicidal thoughts at this time.  The patient is open to trialing buprenorphine /naloxone  (Suboxone ) either as an inpatient or on an outpatient basis. She states that methadone worked better for her in the past, but she returned to fentanyl  use due to ongoing pain. She is currently minimizing her depression and anxiety symptoms but reports poor appetite, likely due to withdrawal. Sleep is reportedly good.  Given her ongoing opioid use and withdrawal symptoms, the patient is a candidate for opioid replacement therapy with Suboxone .   Diagnoses:  Active Hospital problems: Principal Problem:   Acute respiratory failure with hypoxia (HCC) Active Problems:   MDD (major depressive disorder), recurrent severe, without psychosis (HCC)   Substance abuse (HCC)   Elevated LFTs   Cellulitis of both lower extremities   Normocytic anemia   Protein-calorie malnutrition, severe (HCC)   CAP (community acquired pneumonia)   Infectious process   Drug-seeking behavior    Plan   ## Psychiatric Medication Recommendations:  MDD: -Started Cymbalta  20 mg daily  GAD: -Started gabapentin  100 mg TID   Insomnia: -Started Rozerem  8 mg at bedtime  ## Medical Decision Making Capacity: Not specifically addressed in this encounter  ## Further Work-up:  -- most recent EKG on 09/28/23 had QtC of 471 -- Pertinent labwork reviewed earlier this admission includes:  CBC and diff, chem panel, EKG, u/a, toxicology  ## Disposition:-- There are no psychiatric contraindications to discharge at this time Close monitoring of withdrawal symptoms and supportive care is recommended. The patient is  also encouraged to engage in comprehensive pain management and psychiatric support if needed. -There is no indication of acute suicidal ideation or psychiatric instability at this time. ## Behavioral / Environmental: - No specific recommendations at this time.    ## Safety and Observation Level:  - Based on my clinical evaluation, I estimate the patient to be at low risk of self harm in the current setting. - At this time, we recommend  routine. This decision is based on my review of the chart including patient's history and current presentation, interview of the patient, mental status examination, and consideration of suicide risk including evaluating suicidal ideation, plan, intent, suicidal or self-harm behaviors, risk factors, and protective factors. This judgment is based on our ability to directly address suicide risk, implement suicide prevention strategies, and develop a safety plan while the patient is in the clinical setting. Please contact our team if there is a concern that risk level has changed.  CSSR Risk Category:C-SSRS RISK CATEGORY: No Risk  Suicide Risk Assessment: Patient has following modifiable risk factors for suicide: untreated depression and recent loss (death, isolation, vocation), which we are addressing by medication management and recommendation for methadone MAT. Patient has following non-modifiable or demographic risk factors for suicide: psychiatric hospitalization Patient has the following protective factors against suicide: Supportive friends and no history of NSSIB  Thank you for this consult request. Recommendations have been communicated to the primary team.  We will sign off at this time.   Majel GORMAN Ramp, FNP       History of Present Illness  Relevant Aspects of [facility] Course:  Admitted on 09/25/2023 with medical history significant of anxiety, depression, chronic back pain, history of cocaine abuse, history of alcohol  abuse, history of  opioid use disorder, septic embolism, spinal epidural abscess with peristasis of the lower extremities, history of urinary retention, degenerative disc disease, endometriosis with chronic pain, GERD, migraine headaches, obesity who presented to the emergency department with fever and lower extremity swelling.  Psych history of opiate use d/o, severe; depression, anxiety, and PTSD.  Patient Report:  43 yo female admitted for upper quadrant pain and found to have pneumonia and other medical concerns with an 8 day stay so far with a request to see psychiatry.  She reported her depression was bad with no suicidal ideations, past suicide attempts with several admissions at [facility] for polysubstance use and mental health needs (depression, anxiety).  Her anxiety is really bad, I worry all the time and cannot control it.  She quickly transitioned to the statement, My daughter died here.  I have a lot of PTSD and scared to go to the doctor.  Her daughter was 76 and went into septic shock from a dislocated knee, died suddenly.  Her symptoms increased after this and she gave up my boys to their dad, ages 22 & 18 now.  Prior to admission, she was using about 1 gram of heroin which she contributes to doctors stopping Rx for her three 80 mg of oxys for her back pain related to past trauma with rods in her back.  Past use of cocaine abuse and other substances.  She was successful for 5 years at [facility] on methadone, evidently she started when she was pregnant as she said she was able to  be seen quickly.  When she tried recently, the wait was too long and she returned to using heroin.  She would like to return to a methadone.  She is currently living with her boyfriend of 8 years who also uses heroin.  Discussed medications and made changes, see treatment plan.  10/06/2023:  The patient reports feeling physically unwell, with symptoms consistent with opioid withdrawal, including fatigue and irregular breathing. She  describes her body feeling "off," but denies experiencing chest pain, shortness of breath, or dizziness at this time. Despite these discomforts, she remains mentally clear, fully aware of her current condition and treatment needs. She expressed a strong willingness to continue with her care and is motivated to improve, stating she wants to get better. The patient reports taking her medications as prescribed and feels they are beneficial. She denies any suicidal or homicidal ideation, as well as auditory or visual hallucinations. Psych ROS:  Depression: bad Anxiety:  real bad Mania (lifetime and current): none Psychosis: (lifetime and current): none  Review of Systems  Constitutional:  Positive for malaise/fatigue.  HENT: Negative.    Eyes: Negative.   Respiratory:  Negative for cough and sputum production.   Cardiovascular: Negative.   Gastrointestinal:  Positive for diarrhea, nausea and vomiting.  Genitourinary: Negative.   Musculoskeletal:  Positive for myalgias.  Skin: Negative.   Neurological:  Positive for weakness.  Endo/Heme/Allergies: Negative.   Psychiatric/Behavioral:  Positive for substance abuse. Negative for depression. The patient is not nervous/anxious.      Psychiatric and Social History  Psychiatric History:  Information collected from patient and chart.  Prev Dx/Sx: MDD, GAD, PTSD Current Psych Provider: none Home Meds (current): none Previous Med Trials: unknown Therapy: none currently  Prior Psych Hospitalization: [facility], several Prior Self Harm: OD in the past Prior Violence: none  Social History:  Legal Hx: denied Living Situation: lives with her boyfriend who also uses heroin  Access to weapons/lethal means: denied   Substance History Long history of polysubstance use including opiates, heroin, cocaine, etc.  Exam Findings  Physical Exam: completed by the MD, reviewed by this practitioner. Vital Signs:  Temp:  [97.6 F (36.4 C)-98.6 F (37  C)] 98.2 F (36.8 C) (09/16 1006) Pulse Rate:  [106-124] 121 (09/16 1006) Resp:  [19-33] 24 (09/16 1006) BP: (137-148)/(89-101) 148/94 (09/16 1006) SpO2:  [94 %-99 %] 99 % (09/16 1006) Blood pressure (!) 148/94, pulse (!) 121, temperature 98.2 F (36.8 C), temperature source Oral, resp. rate (!) 24, height 5' 5 (1.651 m), weight 97.5 kg, last menstrual period 08/10/2011, SpO2 99%. Body mass index is 35.77 kg/m.  Physical Exam  Mental Status Exam: General Appearance: Disheveled  Orientation:  Full (Time, Place, and Person)  Memory:  Immediate;   Fair Recent;   Fair Remote;   Fair  Concentration:  Concentration: Fair and Attention Span: Fair  Recall:  Fair  Attention  Fair  Eye Contact:  Good  Speech:  Clear and Coherent  Language:  Good  Volume:  Normal  Mood: better  Affect:  Congruent  Thought Process:  Coherent  Thought Content:  Logical  Suicidal Thoughts:  No  Homicidal Thoughts:  No  Judgement:  Fair  Insight:  Fair  Psychomotor Activity:  Decreased  Akathisia:  No  Fund of Knowledge:  Fair      Assets:  Housing Leisure Time Resilience Social Support  Cognition:  WNL  ADL's:  Intact  AIMS (if indicated):        Other  History   These have been pulled in through the EMR, reviewed, and updated if appropriate.  Family History:  The patient's family history includes Breast cancer in her mother; Cancer in her mother; Coronary artery disease in her father and maternal grandmother.  Medical History: Past Medical History:  Diagnosis Date   Alcohol  use disorder 01/04/2012   Detox medically managed successfully during inpatient, adequate for discharge      Anxiety    klonipin for stress disorder   Chronic back pain    Cocaine abuse (HCC) 07/06/2013   Degenerative disc disease    Depression 05/22/2015   Endometriosis    chronic pain   GERD (gastroesophageal reflux disease)    protonix  evenings   Migraine    Septic embolism (HCC)    Spinal epidural  abscess 10/21/2018   Urinary retention     Surgical History: Past Surgical History:  Procedure Laterality Date   ABDOMINAL HYSTERECTOMY     APPENDECTOMY     APPLICATION OF WOUND VAC Left 10/28/2019   Procedure: APPLICATION OF WOUND VAC;  Surgeon: Harden Jerona GAILS, MD;  Location: [facility] OR;  Service: Orthopedics;  Laterality: Left;   I & D EXTREMITY Left 10/22/2019   Procedure: IRRIGATION AND DEBRIDEMENT EXTREMITY; APPLICATION OF WOUND VAC;  Surgeon: Beverley Evalene BIRCH, MD;  Location: [facility] OR;  Service: Orthopedics;  Laterality: Left;   I & D EXTREMITY Left 10/26/2019   Procedure: DEBRIDEMENT LEFT TIBIA;  Surgeon: Harden Jerona GAILS, MD;  Location: [facility];  Service: Orthopedics;  Laterality: Left;  left   I & D EXTREMITY Left 10/28/2019   Procedure: REPEAT DEBRIDEMENT LEFT TIBIA;  Surgeon: Harden Jerona GAILS, MD;  Location: [facility] OR;  Service: Orthopedics;  Laterality: Left;   IR THORACENTESIS ASP PLEURAL SPACE W/IMG GUIDE  10/28/2018   LAMINECTOMY FOR CEREBROSPINAL FLUID LEAK N/A 10/31/2018   Procedure: Thoracic Wound Exploration;  Surgeon: Gillie Duncans, MD;  Location: [facility] OR;  Service: Neurosurgery;  Laterality: N/A;   LAPAROSCOPIC ASSISTED VAGINAL HYSTERECTOMY  08/19/2011   Procedure: LAPAROSCOPIC ASSISTED VAGINAL HYSTERECTOMY;  Surgeon: Peggye Gull, MD;  Location: [facility] ORS;  Service: Gynecology;  Laterality: N/A;   TEE WITHOUT CARDIOVERSION N/A 10/25/2018   Procedure: TRANSESOPHAGEAL ECHOCARDIOGRAM (TEE);  Surgeon: Delford Maude BROCKS, MD;  Location: [facility] ENDOSCOPY;  Service: Cardiovascular;  Laterality: N/A;   THORACIC LAMINECTOMY FOR EPIDURAL ABSCESS N/A 10/20/2018   Procedure: THORACIC Eight, Thoracic nine LAMINECTOMY FOR EPIDURAL ABSCESS;  Surgeon: Joshua Alm RAMAN, MD;  Location: [facility] OR;  Service: Neurosurgery;  Laterality: N/A;   TONSILLECTOMY     TUBAL LIGATION       Medications:   Current Facility-Administered Medications:    acetaminophen  (TYLENOL ) tablet 650 mg, 650 mg, Oral, Q4H PRN, Girguis, David, MD, 650  mg at 10/06/23 0904   albuterol  (PROVENTIL ) (2.5 MG/3ML) 0.083% nebulizer solution 2.5 mg, 2.5 mg, Nebulization, Q4H PRN, Celinda Alm Lot, MD, 2.5 mg at 09/30/23 1358   cefadroxil  (DURICEF) capsule 500 mg, 500 mg, Oral, BID, Mannam, Praveen, MD, 500 mg at 10/06/23 0910   Chlorhexidine  Gluconate Cloth 2 % PADS 6 each, 6 each, Topical, Q2200, Mannam, Praveen, MD, 6 each at 10/03/23 2301   DULoxetine  (CYMBALTA ) DR capsule 20 mg, 20 mg, Oral, BID, Starkes-Perry, Oreste Majeed S, FNP, 20 mg at 10/06/23 0910   gabapentin  (NEURONTIN ) capsule 100 mg, 100 mg, Oral, TID, Jacquetta Sharlot GRADE, NP, 100 mg at 10/06/23 0910   ibuprofen  (ADVIL ) tablet 400 mg, 400 mg, Oral, Q6H PRN, Girguis, David, MD  influenza vac split trivalent PF (FLUZONE ) injection 0.5 mL, 0.5 mL, Intramuscular, Tomorrow-1000, Celinda Alm Lot, MD   LORazepam  (ATIVAN ) tablet 1 mg, 1 mg, Oral, Q6H PRN, Jillian Buttery, MD, 1 mg at 10/06/23 0904   melatonin tablet 5 mg, 5 mg, Oral, QHS PRN, Mannam, Praveen, MD, 5 mg at 10/02/23 0118   nicotine  (NICODERM CQ  - dosed in mg/24 hours) patch 21 mg, 21 mg, Transdermal, Daily PRN, Celinda Alm Lot, MD, 21 mg at 10/04/23 2250   Oral care mouth rinse, 15 mL, Mouth Rinse, PRN, Celinda Alm Lot, MD   oxyCODONE  (Oxy IR/ROXICODONE ) immediate release tablet 5 mg, 5 mg, Oral, Q4H PRN, Girguis, David, MD, 5 mg at 10/06/23 1050   prochlorperazine  (COMPAZINE ) injection 10 mg, 10 mg, Intravenous, Q6H PRN, Celinda Alm Lot, MD, 10 mg at 10/06/23 9346   ramelteon  (ROZEREM ) tablet 8 mg, 8 mg, Oral, QHS, Lord, Jamison Y, NP, 8 mg at 10/05/23 2212   sodium chloride  (OCEAN) 0.65 % nasal spray 1 spray, 1 spray, Each Nare, PRN, Mannam, Praveen, MD, 1 spray at 10/05/23 0354   sodium chloride  flush (NS) 0.9 % injection 10-40 mL, 10-40 mL, Intracatheter, PRN, Jillian Buttery, MD  Current Outpatient Medications:    predniSONE  (DELTASONE ) 20 MG tablet, Take 2 tablets (40 mg total) by mouth daily with breakfast for 5  days., Disp: 10 tablet, Rfl: 0   cefadroxil  (DURICEF) 500 MG capsule, Take 1 capsule (500 mg total) by mouth 2 (two) times daily for 4 days., Disp: 8 capsule, Rfl: 0   DULoxetine  (CYMBALTA ) 20 MG capsule, Take 1 capsule (20 mg total) by mouth 2 (two) times daily., Disp: 60 capsule, Rfl: 3   gabapentin  (NEURONTIN ) 100 MG capsule, Take 1 capsule (100 mg total) by mouth 3 (three) times daily., Disp: 90 capsule, Rfl: 3   ramelteon  (ROZEREM ) 8 MG tablet, Take 1 tablet (8 mg total) by mouth at bedtime., Disp: 30 tablet, Rfl: 1  Allergies: Allergies  Allergen Reactions   Epidural Tray [id number] [Nerve Block Tray] Other (See Comments)    Paralysis and severe pain in head/neck/shoulders.  No anaphylaxis.   Ibuprofen  Hives   Penicillins Hives     Tolerated Cephalosporin 10/22/2019.   Pt has tolerated cephalosporins on past admissions. Has patient had a PCN reaction causing immediate rash, facial/tongue/throat swelling, SOB or lightheadedness with hypotension: Yes Has patient had a PCN reaction causing severe rash involving mucus membranes or skin necrosis: No Has patient had a PCN reaction that required hospitalization No Has patient had a PCN reaction occurring within the last 10 years: No If all of the above answers are NO, then may   Zofran  [Ondansetron  Hcl] Nausea And Vomiting    Majel GORMAN Ramp, FNP

## 2023-10-06 NOTE — Progress Notes (Signed)
   10/06/23 0011  Assess: MEWS Score  Temp 98.4 F (36.9 C)  BP (!) 147/101  MAP (mmHg) 116  Pulse Rate (!) 117  Resp (!) 30  Level of Consciousness Alert  SpO2 98 %  O2 Device HFNC  O2 Flow Rate (L/min) 6 L/min  Assess: MEWS Score  MEWS Temp 0  MEWS Systolic 0  MEWS Pulse 2  MEWS RR 2  MEWS LOC 0  MEWS Score 4  MEWS Score Color Red  Assess: if the MEWS score is Yellow or Red  Were vital signs accurate and taken at a resting state? Yes  Does the patient meet 2 or more of the SIRS criteria? Yes  Does the patient have a confirmed or suspected source of infection? Yes  MEWS guidelines implemented  Yes, red  Treat  MEWS Interventions Considered administering scheduled or prn medications/treatments as ordered  Take Vital Signs  Increase Vital Sign Frequency  Red: Q1hr x2, continue Q4hrs until patient remains green for 12hrs  Escalate  MEWS: Escalate Red: Discuss with charge nurse and notify provider. Consider notifying RRT. If remains red for 2 hours consider need for higher level of care  Notify: Charge Nurse/RN  Name of Charge Nurse/RN Notified [place], RN  Provider Notification  Provider Name/Title Lynwood Kipper, NP  Date Provider Notified 10/06/23  Time Provider Notified 0025  Method of Notification  (secure chat)  Notification Reason Other (Comment) (red MEWS)  Provider response No new orders  Date of Provider Response 10/06/23  Time of Provider Response 0032  Assess: SIRS CRITERIA  SIRS Temperature  0  SIRS Respirations  1  SIRS Pulse 1  SIRS WBC 0  SIRS Score Sum  2

## 2023-10-07 ENCOUNTER — Emergency Department (HOSPITAL_COMMUNITY)

## 2023-10-07 ENCOUNTER — Emergency Department (HOSPITAL_COMMUNITY)
Admission: EM | Admit: 2023-10-07 | Discharge: 2023-10-08 | Disposition: A | Discharge status: Home / Self Care | Attending: Emergency Medicine | Admitting: Emergency Medicine

## 2023-10-07 ENCOUNTER — Encounter (HOSPITAL_COMMUNITY): Payer: Self-pay

## 2023-10-07 ENCOUNTER — Other Ambulatory Visit: Payer: Self-pay

## 2023-10-07 DIAGNOSIS — M545 Low back pain, unspecified: Secondary | ICD-10-CM | POA: Diagnosis not present

## 2023-10-07 DIAGNOSIS — R45851 Suicidal ideations: Secondary | ICD-10-CM | POA: Diagnosis not present

## 2023-10-07 DIAGNOSIS — W19XXXA Unspecified fall, initial encounter: Secondary | ICD-10-CM | POA: Diagnosis not present

## 2023-10-07 DIAGNOSIS — F11288 Opioid dependence with other opioid-induced disorder: Secondary | ICD-10-CM | POA: Diagnosis not present

## 2023-10-07 DIAGNOSIS — M25551 Pain in right hip: Secondary | ICD-10-CM | POA: Diagnosis present

## 2023-10-07 DIAGNOSIS — J9611 Chronic respiratory failure with hypoxia: Secondary | ICD-10-CM | POA: Diagnosis not present

## 2023-10-07 DIAGNOSIS — F1120 Opioid dependence, uncomplicated: Secondary | ICD-10-CM | POA: Diagnosis not present

## 2023-10-07 DIAGNOSIS — R748 Abnormal levels of other serum enzymes: Secondary | ICD-10-CM | POA: Insufficient documentation

## 2023-10-07 DIAGNOSIS — F1129 Opioid dependence with unspecified opioid-induced disorder: Secondary | ICD-10-CM

## 2023-10-07 DIAGNOSIS — R7401 Elevation of levels of liver transaminase levels: Secondary | ICD-10-CM | POA: Insufficient documentation

## 2023-10-07 DIAGNOSIS — Z9981 Dependence on supplemental oxygen: Secondary | ICD-10-CM | POA: Diagnosis present

## 2023-10-07 DIAGNOSIS — F4325 Adjustment disorder with mixed disturbance of emotions and conduct: Secondary | ICD-10-CM | POA: Diagnosis not present

## 2023-10-07 DIAGNOSIS — R519 Headache, unspecified: Secondary | ICD-10-CM | POA: Diagnosis not present

## 2023-10-07 DIAGNOSIS — Z765 Malingerer [conscious simulation]: Secondary | ICD-10-CM | POA: Diagnosis not present

## 2023-10-07 DIAGNOSIS — R7309 Other abnormal glucose: Secondary | ICD-10-CM | POA: Insufficient documentation

## 2023-10-07 DIAGNOSIS — F418 Other specified anxiety disorders: Secondary | ICD-10-CM | POA: Diagnosis not present

## 2023-10-07 DIAGNOSIS — Z7689 Persons encountering health services in other specified circumstances: Secondary | ICD-10-CM | POA: Diagnosis not present

## 2023-10-07 DIAGNOSIS — R739 Hyperglycemia, unspecified: Secondary | ICD-10-CM

## 2023-10-07 LAB — COMPREHENSIVE METABOLIC PANEL WITH GFR
ALT: 18 U/L (ref 0–44)
AST: 65 U/L — ABNORMAL HIGH (ref 15–41)
Albumin: 3.3 g/dL — ABNORMAL LOW (ref 3.5–5.0)
Alkaline Phosphatase: 484 U/L — ABNORMAL HIGH (ref 38–126)
Anion gap: 14 (ref 5–15)
BUN: 9 mg/dL (ref 6–20)
CO2: 21 mmol/L — ABNORMAL LOW (ref 22–32)
Calcium: 9.1 mg/dL (ref 8.9–10.3)
Chloride: 101 mmol/L (ref 98–111)
Creatinine, Ser: 0.6 mg/dL (ref 0.44–1.00)
GFR, Estimated: >60 mL/min (ref >60)
Glucose, Bld: 123 mg/dL — ABNORMAL HIGH (ref 70–99)
Potassium: 3.8 mmol/L (ref 3.5–5.1)
Sodium: 136 mmol/L (ref 135–145)
Total Bilirubin: 1.4 mg/dL — ABNORMAL HIGH (ref 0.0–1.2)
Total Protein: 8.6 g/dL — ABNORMAL HIGH (ref 6.5–8.1)

## 2023-10-07 LAB — LIPASE, BLOOD: Lipase: 83 U/L — ABNORMAL HIGH (ref 11–51)

## 2023-10-07 MED ORDER — OXYCODONE-ACETAMINOPHEN 5-325 MG PO TABS
2.0000 | ORAL_TABLET | Freq: Once | ORAL | Status: AC
  Administered 2023-10-07: 2 via ORAL
  Filled 2023-10-07: qty 2

## 2023-10-07 MED ORDER — FENTANYL CITRATE PF 50 MCG/ML IJ SOSY
50.0000 ug | PREFILLED_SYRINGE | Freq: Once | INTRAMUSCULAR | Status: AC
Start: 2023-10-07 — End: 2023-10-07
  Administered 2023-10-07: 50 ug via INTRAVENOUS
  Filled 2023-10-07: qty 1

## 2023-10-07 NOTE — ED Triage Notes (Signed)
 BIB EMS from home because she is out of oxygen in her O2 tank. O2 company is delivering her oxygen in 2 hours.

## 2023-10-07 NOTE — ED Provider Triage Note (Signed)
 Emergency Medicine Provider Triage Evaluation Note  Aluna Whiston , a 43 y.o. female  was evaluated in triage.  Pt complains of oxygen requirement.  Her oxygen ran out and will be here till 64.  Complains of chronic pain.  Review of Systems  Positive:  Negative: See above   Physical Exam  BP (!) 160/116 (BP Location: Right Arm)   Pulse (!) 112   Temp 97.9 F (36.6 C) (Oral)   Resp 16   Ht 5' 5 (1.651 m)   Wt 97.5 kg   LMP 08/10/2011   SpO2 99%   BMI 35.77 kg/m  Gen:   Awake, no distress   Resp:  Normal effort  MSK:   Moves extremities without difficulty  Other:    Medical Decision Making  Medically screening exam initiated at 7:16 PM.  Appropriate orders placed.  Abeer Deskins was informed that the remainder of the evaluation will be completed by another provider, this initial triage assessment does not replace that evaluation, and the importance of remaining in the ED until their evaluation is complete.     Theotis Peers [place], [place] 10/07/23 1930

## 2023-10-07 NOTE — ED Notes (Signed)
 10/07/23 1740 opened chart with Dawn RN AC to review who delivered 02 to pt, EMS called due to pt being out of 02 and trying to avoid bringing her back to hospital since she does not want to return. [employer] delivered and EMS is aware as well as Corean Flippin at [facility] center.

## 2023-10-08 ENCOUNTER — Emergency Department (HOSPITAL_COMMUNITY)
Admission: EM | Admit: 2023-10-08 | Discharge: 2023-10-09 | Disposition: A | Discharge status: Home / Self Care | Source: Home / Self Care | Attending: Emergency Medicine | Admitting: Emergency Medicine

## 2023-10-08 ENCOUNTER — Emergency Department (HOSPITAL_COMMUNITY)

## 2023-10-08 ENCOUNTER — Other Ambulatory Visit: Payer: Self-pay

## 2023-10-08 ENCOUNTER — Ambulatory Visit: Payer: Self-pay

## 2023-10-08 DIAGNOSIS — W19XXXA Unspecified fall, initial encounter: Secondary | ICD-10-CM

## 2023-10-08 MED ORDER — OXYCODONE-ACETAMINOPHEN 5-325 MG PO TABS
1.0000 | ORAL_TABLET | Freq: Once | ORAL | Status: AC
  Administered 2023-10-08: 1 via ORAL
  Filled 2023-10-08: qty 1

## 2023-10-08 MED ORDER — ACETAMINOPHEN 325 MG PO TABS
650.0000 mg | ORAL_TABLET | ORAL | Status: DC | PRN
  Administered 2023-10-09: 650 mg via ORAL
  Filled 2023-10-08: qty 2

## 2023-10-08 MED ORDER — ZOLPIDEM TARTRATE 5 MG PO TABS
5.0000 mg | ORAL_TABLET | Freq: Every evening | ORAL | Status: DC | PRN
  Administered 2023-10-08: 5 mg via ORAL
  Filled 2023-10-08: qty 1

## 2023-10-08 MED ORDER — GABAPENTIN 100 MG PO CAPS
100.0000 mg | ORAL_CAPSULE | ORAL | Status: AC
  Administered 2023-10-08: 100 mg via ORAL
  Filled 2023-10-08: qty 1

## 2023-10-08 MED ORDER — FENTANYL CITRATE PF 50 MCG/ML IJ SOSY
50.0000 ug | PREFILLED_SYRINGE | Freq: Once | INTRAMUSCULAR | Status: AC
  Administered 2023-10-08: 50 ug via INTRAVENOUS
  Filled 2023-10-08: qty 1

## 2023-10-08 MED ORDER — OXYCODONE-ACETAMINOPHEN 5-325 MG PO TABS
1.0000 | ORAL_TABLET | Freq: Once | ORAL | Status: AC
  Administered 2023-10-08: 1 via ORAL
  Filled 2023-10-08 (×2): qty 1

## 2023-10-08 MED ORDER — METOCLOPRAMIDE HCL 10 MG PO TABS
10.0000 mg | ORAL_TABLET | Freq: Once | ORAL | Status: AC
  Administered 2023-10-08: 10 mg via ORAL
  Filled 2023-10-08: qty 1

## 2023-10-08 MED ORDER — HYDROCODONE-ACETAMINOPHEN 5-325 MG PO TABS
1.0000 | ORAL_TABLET | Freq: Once | ORAL | Status: AC
  Administered 2023-10-08: 1 via ORAL
  Filled 2023-10-08: qty 1

## 2023-10-08 MED ORDER — OXYCODONE-ACETAMINOPHEN 5-325 MG PO TABS
2.0000 | ORAL_TABLET | Freq: Once | ORAL | Status: AC
  Administered 2023-10-08: 2 via ORAL
  Filled 2023-10-08: qty 2

## 2023-10-08 NOTE — ED Notes (Signed)
 Pt woke up and had techs took her to the bathroom. Pt stated,I need some fentanyl , I am hurting. Checked MAR and pt was not ordered anything at this time. MD notified. Pt asked about o2 level. Pt was on 95% on 1L. Pt turned up O2 to 3L.

## 2023-10-08 NOTE — ED Notes (Signed)
 Patient sitting on side of bed stating that she's hot and having a panic attack because she hasn't gotten pain medicine yet.  Patient coached on breathing and given a personal fan.  Patient thankful.

## 2023-10-08 NOTE — ED Notes (Addendum)
 Pt again requested fentanyl . MD aware. Pt has fallen back asleep O2 is at 99% 3L and HR 72

## 2023-10-08 NOTE — ED Notes (Signed)
 Pt is now reporting O2 tanks will not be delivered until after 6pm

## 2023-10-08 NOTE — ED Provider Notes (Addendum)
 Patient had oxygen delivered and plan was to go home.  Patient states that she has no way to get into her home as her boyfriend has the key.  She has been repeatedly requesting pain medication.  Patient has chronic pain and review of the drug database shows that she has prescribed gabapentin .  Review of her recent inpatient chart shows that she does have polysubstance abuse with fentanyl  as well as methamphetamines.  Her UDS was positive.  I will order her this.  Patient has been requesting fentanyl  which I do not believe is in her best interest.  Will give patient a dose of Percocet here.  Plan will be to discharge at around 5:00 so she can go home   Dasie Faden, MD 10/08/23 1143    Dasie Faden, MD 10/08/23 1149

## 2023-10-08 NOTE — ED Notes (Signed)
 When attempting to administer pain and nausea medications, patient states, I want what they gave me earlier, (which was fentanyl ).  Oxicodone doesn't work, I take stronger stuff than that at home.  MD Raford made aware.

## 2023-10-08 NOTE — Discharge Instructions (Addendum)
 Please work with your home health provider to make sure that you do not run out of oxygen at home.  You need to either go through a detox program or get established with a pain management clinic regarding your chronic pain needs.

## 2023-10-08 NOTE — ED Notes (Signed)
 Patient back awake, continuing to ask this RN if the doctor has responded to the secure chat yet about her pain medication.  Patient informed that the doctor is busy, and that he would respond to message when he could, and that I had the oxicodone and reglan  now to give her.  Patient continues to refuse it, stating it doesn't work.

## 2023-10-08 NOTE — ED Notes (Signed)
 While giving fentanyl , patient asked if this was ordered or if she would have to ask for it. Patient informed that she did not have a PRN order at this time.  Patient states, well it's been 2 hours since the last dose, so I can ask again in two hours.  Patient educated on pain medication/scheduling/and PRN doses and patient verbalized understanding.

## 2023-10-08 NOTE — ED Provider Notes (Signed)
 [place] EMERGENCY DEPARTMENT AT [facility] Provider Note   CSN: [id number] Arrival date & time: 10/07/23  1818     Patient presents with: Needs O2 Tank   [name] [name] is a 43 y.o. female.   The history is provided by the patient.   She has history of chronic back pain, polysubstance abuse, GERD and is oxygen dependent and came in because her oxygen tank ran out and she states a new one will not be delivered until 11 AM.  She is also requesting pain medication for her chronic back pain.  I asked her who prescribes her pain medication and she told me that she gets it from Dr. Lovella, but on further probing, she admits that she has not gotten any pain medication from him in the long time and she has been buying her pills on the street.  She is requesting hydromorphone  for pain.  Prior to my seeing her, she had gotten a dose of oxycodone -acetaminophen  and a dose of fentanyl .  She states that oxycodone  does not help her pain.    Prior to Admission medications   Medication Sig Start Date End Date Taking? Authorizing Provider  cefadroxil  (DURICEF) 500 MG capsule Take 1 capsule (500 mg total) by mouth 2 (two) times daily for 4 days. 10/05/23 10/10/23  Patsy Lenis, MD  DULoxetine  (CYMBALTA ) 20 MG capsule Take 1 capsule (20 mg total) by mouth 2 (two) times daily. 10/06/23   Patsy Lenis, MD  gabapentin  (NEURONTIN ) 100 MG capsule Take 1 capsule (100 mg total) by mouth 3 (three) times daily. 10/05/23   Patsy Lenis, MD  predniSONE  (DELTASONE ) 20 MG tablet Take 2 tablets (40 mg total) by mouth daily with breakfast for 5 days. 10/06/23 10/11/23  Patsy Lenis, MD  ramelteon  (ROZEREM ) 8 MG tablet Take 1 tablet (8 mg total) by mouth at bedtime. 10/05/23   Patsy Lenis, MD    Allergies: Epidural tray 17gx3-1-2 [nerve block tray], Ibuprofen , Penicillins, and Zofran  [ondansetron  hcl]    Review of Systems  All other systems reviewed and are negative.   Updated Vital Signs BP (!)  140/95 (BP Location: Right Arm)   Pulse (!) 115   Temp 98.2 F (36.8 C) (Oral)   Resp 18   Ht 5' 5 (1.651 m)   Wt 97.5 kg   LMP 08/10/2011   SpO2 97%   BMI 35.77 kg/m   Physical Exam Vitals and nursing note reviewed.   43 year old female, resting comfortably and in no acute distress. Vital signs are significant for mildly elevated blood pressure and slightly elevated heart rate. Oxygen saturation is 97%, which is normal. Head is normocephalic and atraumatic. PERRLA, EOMI.  Lungs are clear without rales, wheezes, or rhonchi. Chest is nontender. Heart has regular rate and rhythm without murmur. Abdomen is soft, flat, nontender. Neurologic: Awake and alert, moves all extremities equally  (all labs ordered are listed, but only abnormal results are displayed) Labs Reviewed  COMPREHENSIVE METABOLIC PANEL WITH GFR - Abnormal; Notable for the following components:      Result Value   CO2 21 (*)    Glucose, Bld 123 (*)    Total Protein 8.6 (*)    Albumin  3.3 (*)    AST 65 (*)    Alkaline Phosphatase 484 (*)    Total Bilirubin 1.4 (*)    All other components within normal limits  LIPASE, BLOOD - Abnormal; Notable for the following components:   Lipase 83 (*)    All  other components within normal limits  CBC WITH DIFFERENTIAL/PLATELET  CBC WITH DIFFERENTIAL/PLATELET  COMPREHENSIVE METABOLIC PANEL WITH GFR  LIPASE, BLOOD    EKG: EKG Interpretation Date/Time:  Thursday October 08 2023 03:10:13 EDT Ventricular Rate:  103 PR Interval:  132 QRS Duration:  68 QT Interval:  370 QTC Calculation: 484 R Axis:   -1  Text Interpretation: Sinus tachycardia Cannot rule out Anterior infarct , age undetermined Abnormal ECG When compared with ECG of 25-Sep-2023 20:52, HEART RATE has decreased Confirmed by Raford Lenis ([id number]) on 10/08/2023 3:20:24 AM  Radiology: US  Abdomen Limited RUQ (LIVER/GB) Result Date: 10/07/2023 CLINICAL DATA:  Abdominal pain. EXAM: ULTRASOUND ABDOMEN LIMITED  RIGHT UPPER QUADRANT COMPARISON:  Ultrasound and CT 11 days ago 09/26/2023, HIDA scan 09/27/2023 FINDINGS: Gallbladder: Physiologically distended. Borderline gallbladder wall thickening at 3 to 4 mm. No gallstones. No pericholecystic fluid. No sonographic Murphy sign noted by sonographer. Common bile duct: Diameter: 4 mm, previously 7 mm Liver: No focal lesion identified. Heterogeneous and increased in parenchymal echogenicity. No convincing capsular nodularity. Portal vein is patent on color Doppler imaging with normal direction of blood flow towards the liver. Other: No right upper quadrant ascites. IMPRESSION: 1. Borderline gallbladder wall thickening, nonspecific in the setting of liver disease. 2. Previous common bile duct dilatation is not seen on the current exam. No sonographic Murphy sign. 3. Heterogeneous increased hepatic echogenicity typical of steatosis. Electronically Signed   By: Andrea Gasman M.D.   On: 10/07/2023 21:03     Procedures   Medications Ordered in the ED  oxyCODONE -acetaminophen  (PERCOCET/ROXICET) 5-325 MG per tablet 2 tablet (2 tablets Oral Given 10/07/23 1940)  fentaNYL  (SUBLIMAZE ) injection 50 mcg (50 mcg Intravenous Given 10/07/23 2241)  oxyCODONE -acetaminophen  (PERCOCET/ROXICET) 5-325 MG per tablet 1 tablet (1 tablet Oral Given 10/08/23 0209)  metoCLOPramide  (REGLAN ) tablet 10 mg (10 mg Oral Given 10/08/23 0204)  fentaNYL  (SUBLIMAZE ) injection 50 mcg (50 mcg Intravenous Given 10/08/23 0322)                                    Medical Decision Making Risk Prescription drug management.   Chronic oxygen dependence.  Drug-seeking behavior.  I have reviewed her past records, and note hospitalization 09/25/2023-10/06/2023 for what eventually proved to be multifocal pneumonia.  She was noted to have drug-seeking behavior.  I have reviewed her labs here, and my interpretation is mildly elevated lipase of doubtful clinical significance, elevated alkaline phosphatase and AST  and bilirubin not significantly changed from baseline.  Right upper quadrant ultrasound showed nonspecific gallbladder wall thickening but no findings of acute cholecystitis.  I have independently viewed the images, and agree with the radiologist's interpretation.  Patient is continuing to specifically request hydromorphone , I have advised her that I would not prescribe that.  I we will continue to give her small doses of fentanyl  as needed until she is able to go home when her oxygen tank is delivered.  She does state that she has an appointment with the methadone clinic later today which she is encouraged to keep.  Case is signed out to Dr. Dasie.     Final diagnoses:  Chronic respiratory failure with hypoxia (HCC)  Opioid dependence with opioid-induced disorder (HCC)  Elevated random blood glucose level  Elevated AST (SGOT)  Elevated alkaline phosphatase level  Elevated lipase    ED Discharge Orders     None  Raford Lenis, MD 10/08/23 [phone]

## 2023-10-08 NOTE — Telephone Encounter (Signed)
 Pt called from community line with no PCP. Pt is calling to have next oxygen tank delivery set. Pt was unable to reach [employer]. RN called and left voicemail. RN advised pt to seek care at ED if she gets low before delivery. Pt stated she had no other concerns.     Copied from CRM [id number]. Topic: Clinical - Red Word Triage >> Oct 08, 2023  2:01 PM Delon HERO wrote: Red Word that prompted transfer to Nurse Triage: Patient is calling with SOB. Discharged from the hospital with a half a tank of oxygen is having difficulty with the number to [facility] [address], [place] [address], [place]  [phone] Fax [phone] Toll Free [phone]  Patient is panicking.

## 2023-10-08 NOTE — ED Notes (Signed)
 After receiving pain medication, patient now asking for soda and saltine crackers.  Patient given both.

## 2023-10-08 NOTE — ED Notes (Signed)
 Patient states her heart is racing. EKG ordered per protocol.
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
## 2023-10-08 NOTE — ED Notes (Signed)
 Patient requesting pain medication.

## 2023-10-08 NOTE — ED Triage Notes (Signed)
 In triage, pt on 3L and dropping to 87%. Bumped up to 4L.

## 2023-10-08 NOTE — ED Notes (Signed)
 Patient currently lying on side, eyes closed, in NAD.  Respirations even and unlabored.

## 2023-10-08 NOTE — ED Notes (Signed)
 ED provider had just finished having conversation with this pt. This Clinical research associate had come out of the neighboring room and was walking down the hallway when other staff members stated that there was patient in the floor. This writer came back around the corner to find patient laying on her back in the floor beside her bed. ED Provider made aware and came to look at patient. Provider told staff to assist this patient back into the bed and that she was already prescribed something for her chronic pain. Patient safely placed back in the bed locked in the lowest position with bed rails up. After a few minutes the charge nurse came around the corner of room 19 and noticed patient had placed her legs over the rail in an attempt to exit the bed. Charge nurse advises patient to put her legs back in the bed. Patient still requesting patient care coordinator after phone number for same had already been given to her.

## 2023-10-08 NOTE — ED Notes (Signed)
 Pt is now requesting to see the provider. MD approved diet order. Pt requested to see charge RN. Jon, RN and Ole, RN notified. Pt called patient services. MD is aware

## 2023-10-08 NOTE — ED Notes (Signed)
 Breakfast tray given.

## 2023-10-08 NOTE — ED Triage Notes (Signed)
 Pt coming in with lower back/tail bone pain post fall today.  Recently admitted for pneumonia, discharged and came back yesterday with [facility] due to not having o2 at home despite being discharged with it. Went home yesterday, only had 1 tank before running out and having SHOB again.  Also to note, has been evicted from her home and doesn't have anywhere to stay.

## 2023-10-08 NOTE — ED Notes (Signed)
 Patient now requesting dilaudid .  Patient informed that the EDP would have to see her first before that decision is made.

## 2023-10-09 ENCOUNTER — Encounter (HOSPITAL_COMMUNITY): Payer: Self-pay

## 2023-10-09 ENCOUNTER — Telehealth: Payer: Self-pay

## 2023-10-09 ENCOUNTER — Emergency Department (HOSPITAL_COMMUNITY)

## 2023-10-09 ENCOUNTER — Emergency Department (EMERGENCY_DEPARTMENT_HOSPITAL)
Admission: EM | Admit: 2023-10-09 | Discharge: 2023-10-10 | Disposition: A | Discharge status: Home / Self Care | Source: Home / Self Care | Attending: Emergency Medicine | Admitting: Emergency Medicine

## 2023-10-09 ENCOUNTER — Other Ambulatory Visit: Payer: Self-pay

## 2023-10-09 DIAGNOSIS — F4325 Adjustment disorder with mixed disturbance of emotions and conduct: Secondary | ICD-10-CM | POA: Insufficient documentation

## 2023-10-09 DIAGNOSIS — Z7689 Persons encountering health services in other specified circumstances: Secondary | ICD-10-CM | POA: Insufficient documentation

## 2023-10-09 DIAGNOSIS — R45851 Suicidal ideations: Secondary | ICD-10-CM | POA: Insufficient documentation

## 2023-10-09 DIAGNOSIS — M25551 Pain in right hip: Secondary | ICD-10-CM | POA: Insufficient documentation

## 2023-10-09 DIAGNOSIS — W19XXXA Unspecified fall, initial encounter: Secondary | ICD-10-CM | POA: Insufficient documentation

## 2023-10-09 DIAGNOSIS — R519 Headache, unspecified: Secondary | ICD-10-CM | POA: Insufficient documentation

## 2023-10-09 DIAGNOSIS — F418 Other specified anxiety disorders: Secondary | ICD-10-CM | POA: Insufficient documentation

## 2023-10-09 DIAGNOSIS — F1120 Opioid dependence, uncomplicated: Secondary | ICD-10-CM | POA: Insufficient documentation

## 2023-10-09 DIAGNOSIS — M545 Low back pain, unspecified: Secondary | ICD-10-CM | POA: Insufficient documentation

## 2023-10-09 DIAGNOSIS — Z789 Other specified health status: Secondary | ICD-10-CM

## 2023-10-09 DIAGNOSIS — Z765 Malingerer [conscious simulation]: Secondary | ICD-10-CM | POA: Insufficient documentation

## 2023-10-09 LAB — URINALYSIS, ROUTINE W REFLEX MICROSCOPIC
Bilirubin Urine: NEGATIVE
Glucose, UA: NEGATIVE mg/dL
Hgb urine dipstick: NEGATIVE
Ketones, ur: 5 mg/dL — AB
Leukocytes,Ua: NEGATIVE
Nitrite: NEGATIVE
Protein, ur: 30 mg/dL — AB
Specific Gravity, Urine: 1.016 (ref 1.005–1.030)
pH: 7 (ref 5.0–8.0)

## 2023-10-09 LAB — CBC WITH DIFFERENTIAL/PLATELET
Abs Immature Granulocytes: 0.12 K/uL — ABNORMAL HIGH (ref 0.00–0.07)
Basophils Absolute: 0 K/uL (ref 0.0–0.1)
Basophils Relative: 0 %
Eosinophils Absolute: 0 K/uL (ref 0.0–0.5)
Eosinophils Relative: 0 %
HCT: 32 % — ABNORMAL LOW (ref 36.0–46.0)
Hemoglobin: 9.9 g/dL — ABNORMAL LOW (ref 12.0–15.0)
Immature Granulocytes: 1 %
Lymphocytes Relative: 19 %
Lymphs Abs: 1.7 K/uL (ref 0.7–4.0)
MCH: 26.3 pg (ref 26.0–34.0)
MCHC: 30.9 g/dL (ref 30.0–36.0)
MCV: 85.1 fL (ref 80.0–100.0)
Monocytes Absolute: 0.5 K/uL (ref 0.1–1.0)
Monocytes Relative: 6 %
Neutro Abs: 6.4 K/uL (ref 1.7–7.7)
Neutrophils Relative %: 74 %
Platelets: 467 K/uL — ABNORMAL HIGH (ref 150–400)
RBC: 3.76 MIL/uL — ABNORMAL LOW (ref 3.87–5.11)
RDW: 20.7 % — ABNORMAL HIGH (ref 11.5–15.5)
WBC: 8.8 K/uL (ref 4.0–10.5)
nRBC: 0 % (ref 0.0–0.2)

## 2023-10-09 LAB — COMPREHENSIVE METABOLIC PANEL WITH GFR
ALT: 22 U/L (ref 0–44)
AST: 49 U/L — ABNORMAL HIGH (ref 15–41)
Albumin: 2.8 g/dL — ABNORMAL LOW (ref 3.5–5.0)
Alkaline Phosphatase: 315 U/L — ABNORMAL HIGH (ref 38–126)
Anion gap: 10 (ref 5–15)
BUN: 8 mg/dL (ref 6–20)
CO2: 19 mmol/L — ABNORMAL LOW (ref 22–32)
Calcium: 8.6 mg/dL — ABNORMAL LOW (ref 8.9–10.3)
Chloride: 104 mmol/L (ref 98–111)
Creatinine, Ser: 0.58 mg/dL (ref 0.44–1.00)
GFR, Estimated: >60 mL/min (ref >60)
Glucose, Bld: 149 mg/dL — ABNORMAL HIGH (ref 70–99)
Potassium: 3.6 mmol/L (ref 3.5–5.1)
Sodium: 133 mmol/L — ABNORMAL LOW (ref 135–145)
Total Bilirubin: 1.3 mg/dL — ABNORMAL HIGH (ref 0.0–1.2)
Total Protein: 7.9 g/dL (ref 6.5–8.1)

## 2023-10-09 MED ORDER — PROCHLORPERAZINE EDISYLATE 10 MG/2ML IJ SOLN
10.0000 mg | Freq: Once | INTRAMUSCULAR | Status: AC
Start: 2023-10-09 — End: 2023-10-09
  Administered 2023-10-09: 10 mg via INTRAVENOUS
  Filled 2023-10-09: qty 2

## 2023-10-09 MED ORDER — KETOROLAC TROMETHAMINE 15 MG/ML IJ SOLN
15.0000 mg | Freq: Once | INTRAMUSCULAR | Status: AC
Start: 2023-10-09 — End: 2023-10-09
  Administered 2023-10-09: 15 mg via INTRAVENOUS
  Filled 2023-10-09: qty 1

## 2023-10-09 MED ORDER — PREDNISONE 20 MG PO TABS
40.0000 mg | ORAL_TABLET | Freq: Every day | ORAL | Status: DC
  Administered 2023-10-10: 40 mg via ORAL
  Filled 2023-10-09: qty 2

## 2023-10-09 MED ORDER — DULOXETINE HCL 20 MG PO CPEP
20.0000 mg | ORAL_CAPSULE | Freq: Two times a day (BID) | ORAL | Status: DC
  Administered 2023-10-09 (×2): 20 mg via ORAL
  Filled 2023-10-09 (×2): qty 1

## 2023-10-09 MED ORDER — CEFADROXIL 500 MG PO CAPS
500.0000 mg | ORAL_CAPSULE | Freq: Two times a day (BID) | ORAL | Status: DC
  Administered 2023-10-09: 500 mg via ORAL
  Filled 2023-10-09 (×2): qty 1

## 2023-10-09 MED ORDER — RAMELTEON 8 MG PO TABS
8.0000 mg | ORAL_TABLET | Freq: Every day | ORAL | Status: DC
  Administered 2023-10-10: 8 mg via ORAL
  Filled 2023-10-09 (×2): qty 1

## 2023-10-09 MED ORDER — METOCLOPRAMIDE HCL 5 MG/ML IJ SOLN
10.0000 mg | Freq: Once | INTRAMUSCULAR | Status: AC
  Administered 2023-10-09: 10 mg via INTRAMUSCULAR
  Filled 2023-10-09: qty 2

## 2023-10-09 MED ORDER — KETOROLAC TROMETHAMINE 15 MG/ML IJ SOLN
15.0000 mg | Freq: Once | INTRAMUSCULAR | Status: AC
  Administered 2023-10-09: 15 mg via INTRAMUSCULAR
  Filled 2023-10-09: qty 1

## 2023-10-09 MED ORDER — DIPHENHYDRAMINE HCL 50 MG/ML IJ SOLN
25.0000 mg | Freq: Once | INTRAMUSCULAR | Status: AC
Start: 2023-10-09 — End: 2023-10-09
  Administered 2023-10-09: 25 mg via INTRAVENOUS
  Filled 2023-10-09: qty 1

## 2023-10-09 MED ORDER — PROMETHAZINE HCL 25 MG PO TABS
25.0000 mg | ORAL_TABLET | Freq: Once | ORAL | Status: AC
  Administered 2023-10-09: 25 mg via ORAL
  Filled 2023-10-09: qty 1

## 2023-10-09 MED ORDER — FAMOTIDINE 20 MG PO TABS
20.0000 mg | ORAL_TABLET | Freq: Once | ORAL | Status: AC
  Administered 2023-10-09: 20 mg via ORAL
  Filled 2023-10-09: qty 1

## 2023-10-09 MED ORDER — DULOXETINE HCL 20 MG PO CPEP
20.0000 mg | ORAL_CAPSULE | Freq: Two times a day (BID) | ORAL | Status: DC
  Administered 2023-10-10 (×2): 20 mg via ORAL
  Filled 2023-10-09 (×2): qty 1

## 2023-10-09 MED ORDER — GABAPENTIN 100 MG PO CAPS
200.0000 mg | ORAL_CAPSULE | Freq: Once | ORAL | Status: AC
  Administered 2023-10-09: 200 mg via ORAL
  Filled 2023-10-09: qty 2

## 2023-10-09 MED ORDER — METOCLOPRAMIDE HCL 5 MG/ML IJ SOLN: 5.0000 mg | Freq: Once | INTRAMUSCULAR | Status: DC

## 2023-10-09 MED ORDER — GABAPENTIN 100 MG PO CAPS
100.0000 mg | ORAL_CAPSULE | Freq: Three times a day (TID) | ORAL | Status: DC
  Administered 2023-10-10: 100 mg via ORAL
  Filled 2023-10-09: qty 1

## 2023-10-09 MED ORDER — KETOROLAC TROMETHAMINE 15 MG/ML IJ SOLN: 15.0000 mg | Freq: Once | INTRAMUSCULAR | Status: DC

## 2023-10-09 MED ORDER — LACTATED RINGERS IV BOLUS
1000.0000 mL | Freq: Once | INTRAVENOUS | Status: AC
  Administered 2023-10-09: 1000 mL via INTRAVENOUS

## 2023-10-09 MED ORDER — GABAPENTIN 100 MG PO CAPS
100.0000 mg | ORAL_CAPSULE | Freq: Three times a day (TID) | ORAL | Status: DC
  Administered 2023-10-09: 100 mg via ORAL
  Filled 2023-10-09: qty 1

## 2023-10-09 MED ORDER — PREDNISONE 20 MG PO TABS
40.0000 mg | ORAL_TABLET | Freq: Every day | ORAL | Status: DC
  Administered 2023-10-09: 40 mg via ORAL
  Filled 2023-10-09: qty 2

## 2023-10-09 MED ORDER — DROPERIDOL 2.5 MG/ML IJ SOLN
1.2500 mg | Freq: Once | INTRAMUSCULAR | Status: AC
  Administered 2023-10-09: 1.25 mg via INTRAMUSCULAR
  Filled 2023-10-09: qty 2

## 2023-10-09 MED ORDER — RAMELTEON 8 MG PO TABS
8.0000 mg | ORAL_TABLET | Freq: Every day | ORAL | Status: DC
  Administered 2023-10-09: 8 mg via ORAL
  Filled 2023-10-09 (×2): qty 1

## 2023-10-09 NOTE — Discharge Instructions (Addendum)
 You were seen in the emergency department today for a fall and because you were unable to get oxygen at home.  Our team has attempted oxygen delivery at your house twice within the last 2 days.  We will attempt to get your oxygen here in the emergency department and send you home with it.  Please use this oxygen as needed and as prescribed.  Please come back to the emergency department if you have severe shortness of breath, chest pain or if you have any other reason to believe you need emergency care  Psychiatry Resources and Recommendations  Please consider close outpatient follow-up with psychiatric/substance abuse resources given upon discharge Please abstain from illicit substance use endorses utilizing Please adhere to your safety return precautions Please continue current outpatient psychiatric medication regimen Please consider utilization of shelter resources given upon discharge
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
## 2023-10-09 NOTE — ED Provider Notes (Signed)
 [place] EMERGENCY DEPARTMENT AT [facility] Provider Note HPI [name] [name] is a 43 y.o. female with a PMH of polysubstance abuse, depression/anxiety, septic emboli, necrotizing fasciitis of the leg, spinal abscess, recent admission for acute respiratory failure secondary pneumonia who presents to the emergency department for several falls after she could not get into her house.  Patient was discharged 3 days ago on 9/16.  She states that she came back to the hospital yesterday because she never got her oxygen tank.  She is given her oxygen tank yesterday and was discharged home earlier today.  She states that when she went home to her boyfriend's house where she lives, nobody was home and she could not get into the house.  She stayed outside in the heat for 5 hours and felt dehydrated.  She then fell a few times, onto her right pelvis.  She was concerned if she hit her head.  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   Urinary retention    Past Surgical History:  Procedure Laterality Date   ABDOMINAL HYSTERECTOMY     APPENDECTOMY     APPLICATION OF WOUND VAC Left 10/28/2019   Procedure: APPLICATION OF WOUND VAC;  Surgeon: Harden Jerona GAILS, MD;  Location: [facility] OR;  Service: Orthopedics;  Laterality: Left;   I & D EXTREMITY Left 10/22/2019   Procedure: IRRIGATION AND DEBRIDEMENT EXTREMITY; APPLICATION OF WOUND VAC;  Surgeon: Beverley Evalene BIRCH, MD;  Location: [facility] OR;  Service: Orthopedics;   Laterality: Left;   I & D EXTREMITY Left 10/26/2019   Procedure: DEBRIDEMENT LEFT TIBIA;  Surgeon: Harden Jerona GAILS, MD;  Location: [facility] OR;  Service: Orthopedics;  Laterality: Left;  left   I & D EXTREMITY Left 10/28/2019   Procedure: REPEAT DEBRIDEMENT LEFT TIBIA;  Surgeon: Harden Jerona GAILS, MD;  Location: [facility] OR;  Service: Orthopedics;  Laterality: Left;   IR THORACENTESIS ASP PLEURAL SPACE W/IMG GUIDE  10/28/2018   LAMINECTOMY FOR CEREBROSPINAL FLUID LEAK N/A 10/31/2018   Procedure: Thoracic Wound Exploration;  Surgeon: Gillie Duncans, MD;  Location: [facility] OR;  Service: Neurosurgery;  Laterality: N/A;   LAPAROSCOPIC ASSISTED VAGINAL HYSTERECTOMY  08/19/2011   Procedure: LAPAROSCOPIC ASSISTED VAGINAL HYSTERECTOMY;  Surgeon: Peggye Gull, MD;  Location: [facility] ORS;  Service: Gynecology;  Laterality: N/A;   TEE WITHOUT CARDIOVERSION N/A 10/25/2018   Procedure: TRANSESOPHAGEAL ECHOCARDIOGRAM (TEE);  Surgeon: Delford Maude BROCKS, MD;  Location: [facility] ENDOSCOPY;  Service: Cardiovascular;  Laterality: N/A;   THORACIC LAMINECTOMY FOR EPIDURAL ABSCESS N/A 10/20/2018   Procedure: THORACIC Eight, Thoracic nine LAMINECTOMY FOR EPIDURAL ABSCESS;  Surgeon: Joshua Alm RAMAN, MD;  Location: [facility] OR;  Service: Neurosurgery;  Laterality: N/A;   TONSILLECTOMY     TUBAL LIGATION      Review of Systems Pertinent positives and negative findings are listed as part of the History of Present Illness and MDM  Physical Exam Vitals:   10/09/23 1920 10/09/23  2047 10/09/23 2304 10/09/23 2305  BP:   (!) 136/93   Pulse: (!) 119  (!) 103   Resp:   17   Temp: 98.8 F (37.1 C) 98.1 F (36.7 C)    TempSrc: Oral Oral    SpO2: 97%  98% 98%  Weight:      Height:         Constitutional Nursing notes reviewed Vital signs reviewed  HEENT No obvious trauma Pupils round, equal, and reactive to light. Pupils cross midline Neck supple  Respiratory Effort normal Breathing well on room air without hypoxia or tachypnea CTAB  CV Normal rate and  rhythm  No pitting edema No chest wall tenderness to palpation  Abdomen Soft, non-tender, non-distended No peritonitis  MSK Atraumatic No obvious deformity Lumbar paraspinal muscle tenderness to palpation on the right side ROM appropriate  Neuro Awake and alert Pupils cross midline Moving all extremities Able to ambulate    MDM:  Initial Differential Diagnoses includes fracture, dislocation, soft tissue injury, malingering, drug-seeking, social needs  I reviewed the patient's vitals, the nursing triage note and evaluated the patient at bedside.  Patient is mildly tachycardic but otherwise hemodynamic stable.  She is breathing well on room air without hypoxia or tachypnea.  I reviewed the patient's external records which show the patient was recently discharged on 9/16 after stay for multifocal pneumonia where she had a new oxygen requirement.  She came back to the emergency department early in the morning on 9/18 because she did not have oxygen at home.  She was given an oxygen tank and sent home earlier this morning.  There was concern for drug seeking behavior during her stay in the emergency department. She had labs earlier today prior to discharge which were reassuring.  When she went home, she states that she could not get into her boyfriend's house because nobody was home and she did not have a key.  She waited outside for 5 hours and felt dehydrated.  She then fell several times because of dizziness and dehydration.  Chart review reveals that oxygen delivery was attempted today but could not be delivered because nobody was at home to answer.  Plain films and CT imaging ordered in triage showed no acute traumatic injury.  Patient was able to ambulate without difficulty.  Labs were obtained this morning prior to discharge I will not repeat them.  Patient was given a bolus of fluids, anti-nausea meds, and a headache cocktail.  Tachycardia improved.  Patient remains breathing well on room  air without tachypnea or hypoxia. I encouraged p.o. intake.  Social work/case management consulted and they have high suspicion for malingering.  Oxygen delivery was attempted twice at her place of residence per case management.  Plan will be to deliver O2 to the patient in the emergency department tomorrow morning and send her home.  Home medications reordered  Procedures: Procedures  Medications administered in the ED: Medications  DULoxetine  (CYMBALTA ) DR capsule 20 mg (has no administration in time range)  gabapentin  (NEURONTIN ) capsule 100 mg (has no administration in time range)  predniSONE  (DELTASONE ) tablet 40 mg (has no administration in time range)  ramelteon  (ROZEREM ) tablet 8 mg (has no administration in time range)  lactated ringers  bolus 1,000 mL (0 mLs Intravenous Stopped 10/09/23 2307)  prochlorperazine  (COMPAZINE ) injection 10 mg (10 mg Intravenous Given 10/09/23 2127)  ketorolac  (TORADOL ) 15 MG/ML injection 15 mg (15 mg Intravenous Given 10/09/23 2127)  diphenhydrAMINE  (BENADRYL ) injection 25 mg (25 mg Intravenous  Given 10/09/23 2245)  gabapentin  (NEURONTIN ) capsule 200 mg (200 mg Oral Given 10/09/23 2244)     Impression: 1. Fall, initial encounter   2. Need for follow-up by social worker      Patient's presentation is most consistent with acute presentation with potential threat to life or bodily function.  Disposition: ED Disposition:  Discharge   Discharge: Patient is felt to be medically appropriate for discharge at this time. Patient was instructed to follow up with their primary care doctor/specialists listed above for re-evaluation. Patient was given strict return precautions.  ED Discharge Orders     None             Dionisio Blunt, MD 10/09/23 2345    Patsey Lot, MD 10/12/23 [phone]

## 2023-10-09 NOTE — ED Triage Notes (Signed)
 Pt comming from home . Pt reports that she was discharged  from the er today. Pt reports multiple ed visits recently for shortness of breath. Pt was unable to get into her residence. Pt reports falling multiple times looking for help to get into her residence. Pt reports that she has not eaten in days.

## 2023-10-09 NOTE — ED Provider Notes (Signed)
  Physical Exam  BP 119/88   Pulse (!) 112   Temp 98.7 F (37.1 C) (Oral)   Resp 20   Ht 5' 5 (1.651 m)   Wt 97.5 kg   LMP 08/10/2011   SpO2 98%   BMI 35.77 kg/m   Physical Exam Vitals and nursing note reviewed.  Constitutional:      General: She is not in acute distress.    Appearance: She is well-developed.  HENT:     Head: Normocephalic and atraumatic.  Eyes:     Conjunctiva/sclera: Conjunctivae normal.  Cardiovascular:     Rate and Rhythm: Normal rate and regular rhythm.     Heart sounds: No murmur heard. Pulmonary:     Effort: Pulmonary effort is normal. No respiratory distress.     Breath sounds: Normal breath sounds.  Abdominal:     Palpations: Abdomen is soft.     Tenderness: There is no abdominal tenderness.  Musculoskeletal:        General: No swelling.     Cervical back: Neck supple.  Skin:    General: Skin is warm and dry.     Capillary Refill: Capillary refill takes less than 2 seconds.  Neurological:     Mental Status: She is alert.  Psychiatric:        Mood and Affect: Mood normal.     Procedures  Procedures  ED Course / MDM   Clinical Course as of 10/09/23 0905  Fri Oct 09, 2023  0102 Patient requesting Ativan . It does not appear that she is prescribed this medication. She was started on Gabapentin  by psychiatry for GAD during her recent inpatient stay. This has been ordered as well as her other daily medications. [KH]  0407 Requested to speak with provider about something stronger for her pain. I have indicated to the patient that we will not be continuing to provide narcotics to her when it is not a medication she will be receiving upon discharge. Remains holding in the department for care management reasons only. Patient requesting Toradol ; this will be provided and she has tolerated it in the past. Also requesting something for reflux. Pepcid  ordered. [KH]    Clinical Course User Index [KH] Keith Sor, PA-C   Medical Decision  Making Amount and/or Complexity of Data Reviewed Labs: ordered. Radiology: ordered.  Risk OTC drugs. Prescription drug management.     Went evaluate the patient.  Sleeping whenever I saw the patient.  Still pending delivery of O2 system.  Patient resting in bed.  Sleeping.  When she wakes up, she complains about some nausea.  Benign abdomen.  Patient able to tolerate p.o. here in the ED.  Patient had home O2 delivered.  Laboratory workup shows no changes compared to prior.  No further imaging needed at this time.  T. bili shows no changes and no increase in alk phos compared to baseline.  No concerns any kind of biliary pathology this point in time.  She can follow-up outpatient.  Vital signs remained stable throughout the ED stay.  Remained high 90s O2 saturation.  Maps appropriate.  No tachycardia.         Simon Lavonia SAILOR, MD 10/09/23 1200

## 2023-10-09 NOTE — ED Notes (Signed)
 Entered pt's room to administer requested phenergan .  Pt lying on left side, eyes closed, respirations deep, even and nonlabored.  Will hold medications until pt awake.

## 2023-10-09 NOTE — TOC Progression Note (Addendum)
 Transition of Care [facility]) - Progression Note    Patient Details  Name: [name] [name] MRN: [id number] Date of Birth: December 12, 1980  [facility] CM/SW Contact  Corean JAYSON Canary, RN Phone Number: 10/09/2023, 8:29 AM  Clinical Narrative:    Called Jermaine from [facility], they were awaiting a call to set up oxygen last night. He will reach out to boyfriend and bring a tank up  to patient for discharge.  1100 Oxygen delivered to room.                     Expected Discharge Plan and Services                                               Social Drivers of Health (SDOH) Interventions SDOH Screenings   Food Insecurity: No Food Insecurity (09/26/2023)  Housing: Patient Declined (09/26/2023)  Transportation Needs: No Transportation Needs (09/26/2023)  Utilities: Not At Risk (09/26/2023)  Depression (PHQ2-9): Medium Risk (04/06/2018)  Tobacco Use: High Risk (10/07/2023)    Readmission Risk Interventions    09/28/2023    3:19 PM  Readmission Risk Prevention Plan  Transportation Screening Complete  PCP or Specialist Appt within 5-7 Days Complete  Home Care Screening Complete  Medication Review (RN CM) Complete

## 2023-10-09 NOTE — ED Notes (Signed)
 Attempted blood draw without success, will contact phlebotomy to try.

## 2023-10-09 NOTE — Telephone Encounter (Signed)
 Jermaine from [facility] called in to say the driver went to the door of the patient and knocked several times, no-one answered. Jermaine has been calling the patient and leaving messages for delivery.

## 2023-10-09 NOTE — TOC Initial Note (Signed)
 Transition of Care [facility]) - Initial/Assessment Note    Patient Details  Name: [name] [name] MRN: [id number] Date of Birth: [birth year]  Transition of Care [facility]) CM/SW Contact:    Hartley KATHEE Robertson, LCSWA Phone Number: 10/09/2023, 10:55 PM  Clinical Narrative:                  Pt known to CSW from previous ED encounter, pt stated she was evicted but then clarified she was served with an eviction notice and is currently living with her boyfriend. Per chart review-home oxygen should have been delivered this evening, however when [employer] arrived no one answered, pt back in the ED complaining of multiple falls. CSW attempted to contact [employer] about oxygen being delivered to the hospital, no response. Pt will need to board tonight and RNCM will be able to look into oxygen issue tomorrow, EDP made aware and in agreement.         Patient Goals and CMS Choice            Expected Discharge Plan and Services                                              Prior Living Arrangements/Services                       Activities of Daily Living      Permission Sought/Granted                  Emotional Assessment              Admission diagnosis:  Falls Patient Active Problem List   Diagnosis Date Noted   Infectious process 10/03/2023   Drug-seeking behavior 10/03/2023   CAP (community acquired pneumonia) 09/30/2023   Acute respiratory failure with hypoxia (HCC) 09/29/2023   Elevated LFTs 09/26/2023   Cellulitis of both lower extremities 09/26/2023   Normocytic anemia 09/26/2023   Protein-calorie malnutrition, severe (HCC) 09/26/2023   Necrotizing fasciitis due to Streptococcus pyogenes (HCC)    Wound infection 10/21/2019   Cellulitis 10/21/2019   Substance abuse (HCC) 10/21/2019   Anxiety 10/21/2019   Fever    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   Paresthesia of both legs    [name]    Urinary retention    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   IVDU (intravenous drug user)    Septic embolism (HCC)    Loculated pleural effusion 10/20/2018   Breast mass, right 10/12/2017   Neuropathy 07/14/2017   MDD (major depressive disorder), recurrent severe, 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   PCP:  Pcp, No Pharmacy:   [id number] [place], [place] - [address] AT [facility] [address] [place] [address] Phone: [phone] Fax: [phone]  [facility] #[id number] [place], [place] - [address] AT [facility] [address] [place] [address] Phone: [phone] Fax: [phone]  [place] [facility] [address] [place] [address] Phone: [phone] Fax: [phone]  [facility] [address] [place] [address] Phone: [phone] Fax: [phone]     Social Drivers of Health (SDOH) Social History: SDOH Screenings   Food  Insecurity: No Food Insecurity (09/26/2023)  Housing: Patient Declined (09/26/2023)  Transportation Needs: No Transportation Needs (09/26/2023)  Utilities: Not At Risk (09/26/2023)  Depression (PHQ2-9): Medium Risk (04/06/2018)  Tobacco Use: High Risk (10/09/2023)   SDOH Interventions:     Readmission Risk Interventions    09/28/2023    3:19 PM  Readmission Risk Prevention Plan  Transportation Screening Complete  PCP or Specialist Appt within 5-7 Days Complete  Home Care Screening Complete  Medication Review (RN CM) Complete

## 2023-10-09 NOTE — Discharge Instructions (Signed)
 Please continue to use your oxygen at home.  Please follow-up with your PCP.   If you have any Chest pain or shortness of breath or any kind of fever or chills then come back to the ED.

## 2023-10-10 ENCOUNTER — Other Ambulatory Visit (HOSPITAL_COMMUNITY)
Admission: AD | Admit: 2023-10-10 | Discharge: 2023-10-15 | Disposition: A | Discharge status: Home / Self Care | Source: Intra-hospital | Attending: Psychiatry | Admitting: Psychiatry

## 2023-10-10 ENCOUNTER — Encounter (HOSPITAL_COMMUNITY): Payer: Self-pay | Admitting: Psychiatry

## 2023-10-10 ENCOUNTER — Ambulatory Visit (HOSPITAL_COMMUNITY)
Admission: EM | Admit: 2023-10-10 | Discharge: 2023-10-10 | Disposition: A | Discharge status: Other Acute Inpatient Hospital | Attending: Psychiatry | Admitting: Psychiatry

## 2023-10-10 ENCOUNTER — Encounter (HOSPITAL_COMMUNITY): Payer: Self-pay

## 2023-10-10 ENCOUNTER — Other Ambulatory Visit: Payer: Self-pay

## 2023-10-10 DIAGNOSIS — F4325 Adjustment disorder with mixed disturbance of emotions and conduct: Secondary | ICD-10-CM

## 2023-10-10 DIAGNOSIS — G8929 Other chronic pain: Secondary | ICD-10-CM | POA: Insufficient documentation

## 2023-10-10 DIAGNOSIS — F19139 Other psychoactive substance abuse with withdrawal, unspecified: Secondary | ICD-10-CM | POA: Insufficient documentation

## 2023-10-10 DIAGNOSIS — F332 Major depressive disorder, recurrent severe without psychotic features: Secondary | ICD-10-CM

## 2023-10-10 DIAGNOSIS — F1520 Other stimulant dependence, uncomplicated: Secondary | ICD-10-CM | POA: Insufficient documentation

## 2023-10-10 DIAGNOSIS — F1193 Opioid use, unspecified with withdrawal: Secondary | ICD-10-CM | POA: Insufficient documentation

## 2023-10-10 DIAGNOSIS — R45851 Suicidal ideations: Secondary | ICD-10-CM | POA: Insufficient documentation

## 2023-10-10 DIAGNOSIS — F411 Generalized anxiety disorder: Secondary | ICD-10-CM | POA: Insufficient documentation

## 2023-10-10 DIAGNOSIS — F4310 Post-traumatic stress disorder, unspecified: Secondary | ICD-10-CM | POA: Insufficient documentation

## 2023-10-10 DIAGNOSIS — Z765 Malingerer [conscious simulation]: Secondary | ICD-10-CM | POA: Diagnosis not present

## 2023-10-10 DIAGNOSIS — Z9151 Personal history of suicidal behavior: Secondary | ICD-10-CM | POA: Insufficient documentation

## 2023-10-10 DIAGNOSIS — Z634 Disappearance and death of family member: Secondary | ICD-10-CM | POA: Insufficient documentation

## 2023-10-10 DIAGNOSIS — M450 Ankylosing spondylitis of multiple sites in spine: Secondary | ICD-10-CM | POA: Insufficient documentation

## 2023-10-10 DIAGNOSIS — Z79899 Other long term (current) drug therapy: Secondary | ICD-10-CM | POA: Insufficient documentation

## 2023-10-10 DIAGNOSIS — F1994 Other psychoactive substance use, unspecified with psychoactive substance-induced mood disorder: Secondary | ICD-10-CM | POA: Insufficient documentation

## 2023-10-10 DIAGNOSIS — F319 Bipolar disorder, unspecified: Secondary | ICD-10-CM | POA: Insufficient documentation

## 2023-10-10 DIAGNOSIS — Z818 Family history of other mental and behavioral disorders: Secondary | ICD-10-CM | POA: Insufficient documentation

## 2023-10-10 DIAGNOSIS — E559 Vitamin D deficiency, unspecified: Secondary | ICD-10-CM | POA: Insufficient documentation

## 2023-10-10 DIAGNOSIS — Z62810 Personal history of physical and sexual abuse in childhood: Secondary | ICD-10-CM | POA: Insufficient documentation

## 2023-10-10 DIAGNOSIS — Z560 Unemployment, unspecified: Secondary | ICD-10-CM | POA: Insufficient documentation

## 2023-10-10 DIAGNOSIS — Z5900 Homelessness unspecified: Secondary | ICD-10-CM | POA: Insufficient documentation

## 2023-10-10 DIAGNOSIS — J189 Pneumonia, unspecified organism: Secondary | ICD-10-CM | POA: Insufficient documentation

## 2023-10-10 DIAGNOSIS — F1120 Opioid dependence, uncomplicated: Secondary | ICD-10-CM | POA: Diagnosis not present

## 2023-10-10 DIAGNOSIS — F1910 Other psychoactive substance abuse, uncomplicated: Secondary | ICD-10-CM

## 2023-10-10 DIAGNOSIS — L03119 Cellulitis of unspecified part of limb: Secondary | ICD-10-CM

## 2023-10-10 LAB — POCT URINE DRUG SCREEN - MANUAL ENTRY (I-SCREEN)
POC Amphetamine UR: NOT DETECTED
POC Buprenorphine (BUP): NOT DETECTED
POC Cocaine UR: NOT DETECTED
POC Marijuana UR: NOT DETECTED
POC Methadone UR: NOT DETECTED
POC Methamphetamine UR: NOT DETECTED
POC Morphine: NOT DETECTED
POC Oxazepam (BZO): POSITIVE — AB
POC Oxycodone UR: POSITIVE — AB
POC Secobarbital (BAR): NOT DETECTED

## 2023-10-10 MED ORDER — HALOPERIDOL 5 MG PO TABS: 5.0000 mg | ORAL_TABLET | Freq: Three times a day (TID) | ORAL | Status: DC | PRN

## 2023-10-10 MED ORDER — HALOPERIDOL LACTATE 5 MG/ML IJ SOLN: 10.0000 mg | Freq: Three times a day (TID) | INTRAMUSCULAR | Status: DC | PRN

## 2023-10-10 MED ORDER — ALUM & MAG HYDROXIDE-SIMETH 200-200-20 MG/5ML PO SUSP
30.0000 mL | ORAL | Status: DC | PRN
  Administered 2023-10-10 – 2023-10-12 (×5): 30 mL via ORAL
  Filled 2023-10-10 (×5): qty 30

## 2023-10-10 MED ORDER — HYDROXYZINE HCL 25 MG PO TABS
25.0000 mg | ORAL_TABLET | Freq: Four times a day (QID) | ORAL | Status: DC | PRN
  Administered 2023-10-10 – 2023-10-12 (×3): 25 mg via ORAL
  Filled 2023-10-10 (×3): qty 1

## 2023-10-10 MED ORDER — METHOCARBAMOL 500 MG PO TABS: 500.0000 mg | ORAL_TABLET | Freq: Three times a day (TID) | ORAL | Status: DC | PRN

## 2023-10-10 MED ORDER — DIPHENHYDRAMINE HCL 50 MG PO CAPS: 50.0000 mg | ORAL_CAPSULE | Freq: Three times a day (TID) | ORAL | Status: DC | PRN

## 2023-10-10 MED ORDER — HALOPERIDOL LACTATE 5 MG/ML IJ SOLN: 5.0000 mg | Freq: Three times a day (TID) | INTRAMUSCULAR | Status: DC | PRN

## 2023-10-10 MED ORDER — ACETAMINOPHEN 325 MG PO TABS: 650.0000 mg | ORAL_TABLET | Freq: Four times a day (QID) | ORAL | Status: DC | PRN

## 2023-10-10 MED ORDER — PROCHLORPERAZINE MALEATE 5 MG PO TABS
5.0000 mg | ORAL_TABLET | Freq: Once | ORAL | Status: AC
  Administered 2023-10-10: 5 mg via ORAL
  Filled 2023-10-10: qty 1

## 2023-10-10 MED ORDER — METHOCARBAMOL 500 MG PO TABS
500.0000 mg | ORAL_TABLET | Freq: Three times a day (TID) | ORAL | Status: DC | PRN
  Administered 2023-10-10 – 2023-10-14 (×2): 500 mg via ORAL
  Filled 2023-10-10 (×2): qty 1

## 2023-10-10 MED ORDER — HYDROXYZINE HCL 25 MG PO TABS
25.0000 mg | ORAL_TABLET | Freq: Four times a day (QID) | ORAL | Status: DC | PRN
  Administered 2023-10-10: 25 mg via ORAL
  Filled 2023-10-10: qty 1

## 2023-10-10 MED ORDER — ALUM & MAG HYDROXIDE-SIMETH 200-200-20 MG/5ML PO SUSP
30.0000 mL | ORAL | Status: DC | PRN
  Administered 2023-10-10: 30 mL via ORAL
  Filled 2023-10-10: qty 30

## 2023-10-10 MED ORDER — LOPERAMIDE HCL 2 MG PO CAPS
4.0000 mg | ORAL_CAPSULE | Freq: Once | ORAL | Status: AC
  Administered 2023-10-10: 4 mg via ORAL
  Filled 2023-10-10: qty 2

## 2023-10-10 MED ORDER — DICYCLOMINE HCL 20 MG PO TABS
20.0000 mg | ORAL_TABLET | Freq: Four times a day (QID) | ORAL | Status: DC | PRN
  Administered 2023-10-10: 20 mg via ORAL
  Filled 2023-10-10: qty 1

## 2023-10-10 MED ORDER — DIPHENHYDRAMINE HCL 50 MG/ML IJ SOLN: 50.0000 mg | Freq: Three times a day (TID) | INTRAMUSCULAR | Status: DC | PRN

## 2023-10-10 MED ORDER — MAGNESIUM HYDROXIDE 400 MG/5ML PO SUSP: 30.0000 mL | Freq: Every day | ORAL | Status: DC | PRN

## 2023-10-10 MED ORDER — ACETAMINOPHEN 325 MG PO TABS
650.0000 mg | ORAL_TABLET | Freq: Four times a day (QID) | ORAL | Status: DC | PRN
  Administered 2023-10-11 – 2023-10-14 (×8): 650 mg via ORAL
  Filled 2023-10-10 (×8): qty 2

## 2023-10-10 MED ORDER — TRAZODONE HCL 50 MG PO TABS
50.0000 mg | ORAL_TABLET | Freq: Every evening | ORAL | Status: DC | PRN
  Administered 2023-10-10 – 2023-10-14 (×3): 50 mg via ORAL
  Filled 2023-10-10 (×3): qty 1

## 2023-10-10 MED ORDER — PROMETHAZINE HCL 25 MG PO TABS
25.0000 mg | ORAL_TABLET | Freq: Once | ORAL | Status: AC
  Administered 2023-10-10: 25 mg via ORAL
  Filled 2023-10-10: qty 1

## 2023-10-10 MED ORDER — DIPHENHYDRAMINE HCL 50 MG PO CAPS
50.0000 mg | ORAL_CAPSULE | Freq: Three times a day (TID) | ORAL | Status: DC | PRN
  Administered 2023-10-10: 50 mg via ORAL
  Filled 2023-10-10: qty 1

## 2023-10-10 MED ORDER — LORAZEPAM 2 MG/ML IJ SOLN: 2.0000 mg | Freq: Three times a day (TID) | INTRAMUSCULAR | Status: DC | PRN

## 2023-10-10 MED ORDER — DICYCLOMINE HCL 20 MG PO TABS: 20.0000 mg | ORAL_TABLET | Freq: Four times a day (QID) | ORAL | Status: DC | PRN

## 2023-10-10 MED ORDER — LOPERAMIDE HCL 2 MG PO CAPS
2.0000 mg | ORAL_CAPSULE | ORAL | Status: DC | PRN
  Administered 2023-10-13: 4 mg via ORAL
  Filled 2023-10-10: qty 2

## 2023-10-10 MED ORDER — LOPERAMIDE HCL 2 MG PO CAPS: 2.0000 mg | ORAL_CAPSULE | ORAL | Status: DC | PRN

## 2023-10-10 MED ORDER — TRAZODONE HCL 50 MG PO TABS: 50.0000 mg | ORAL_TABLET | Freq: Every evening | ORAL | Status: DC | PRN

## 2023-10-10 MED ORDER — HALOPERIDOL 5 MG PO TABS
5.0000 mg | ORAL_TABLET | Freq: Three times a day (TID) | ORAL | Status: DC | PRN
  Administered 2023-10-10: 5 mg via ORAL
  Filled 2023-10-10 (×2): qty 1

## 2023-10-10 MED ORDER — MELATONIN 3 MG PO TABS
3.0000 mg | ORAL_TABLET | ORAL | Status: AC
  Administered 2023-10-10: 3 mg via ORAL
  Filled 2023-10-10: qty 1

## 2023-10-10 MED ORDER — NICOTINE 14 MG/24HR TD PT24: 14.0000 mg | MEDICATED_PATCH | Freq: Every day | TRANSDERMAL | Status: DC

## 2023-10-10 NOTE — ED Notes (Signed)
 Updated EDP on pt's requests and events of the morning.  EDP will review chart and be over to assess pt as quickly as possible.
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
## 2023-10-10 NOTE — ED Notes (Signed)
 Pt again yelling at staff that everyone is ignoring me just get me a fucking immodium.  Explained to pt again that nothing can be given to her without an order from a doctor.  Pt asks to just get the order.  Explained to pt that doctor will not give new orders without examining pt.  Pt again yelling at staff and security.

## 2023-10-10 NOTE — Care Management (Signed)
 Transition of Care [facility]) - Emergency Department Mini Assessment   Patient Details  Name: [name] [name] MRN: [id number] Date of Birth: 1980-04-05  Transition of Care [facility]) CM/SW Contact:    Corean JAYSON Canary, RN Phone Number: 10/10/2023, 10:32 AM   Clinical Narrative:  Patient presented to the ED again last night for running out of oxygen. [employer] had tried to deliver last night but no answer at the door no answer to multiple phone calls. Nursing redid oxygen qualifications and the patient DOES NOT meet for oxygen, saturations stayed above 90% Informed Jermaine at [employer].   ED Mini Assessment: What brought you to the Emergency Department? 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   Infectious process 10/03/2023   Malingering 10/03/2023   CAP (community acquired pneumonia) 09/30/2023   Acute respiratory failure with hypoxia (HCC) 09/29/2023   Elevated LFTs 09/26/2023   Cellulitis of both lower extremities 09/26/2023   Normocytic anemia 09/26/2023   Protein-calorie malnutrition, severe (HCC) 09/26/2023   Necrotizing fasciitis due to Streptococcus pyogenes (HCC)    Wound infection 10/21/2019   Cellulitis 10/21/2019   Substance abuse (HCC) 10/21/2019   Anxiety 10/21/2019   Fever    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   PCP:  Pcp, No Pharmacy:   [id number] [place], [place] - [address] AT [facility] [address] [place] [address] Phone: [phone] Fax: [phone]  [id number] [place], [place] - [address] AT [facility] OF [facility] & [address] [place] [address] Phone: [phone] Fax: [phone]  [facility] [address] [place] [address] Phone: [phone] Fax: [phone]  [facility] [address] [place] [address] Phone: [phone] Fax: [phone]

## 2023-10-10 NOTE — Discharge Instructions (Signed)
 Transfer to [facility]

## 2023-10-10 NOTE — ED Notes (Signed)
 Pt c/o that she did not receive a breakfast tray and that she is hungry.  Pt offered a [place] sandwich and refuses.  Dietary called to deliver tray.
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
## 2023-10-10 NOTE — ED Notes (Signed)
 Assuming care of pt at this time. Pt ambulated to room with RN. She is AAOx4

## 2023-10-10 NOTE — ED Notes (Signed)
 TTS at bedside.

## 2023-10-10 NOTE — ED Notes (Signed)
 Upon presenting pt with her discharge papers she is asking for hospital to arrange transportation to the [facility].  Offered pt a bus pass that she could use to any destination of her choice. Pt states she can not walk to the bus stop. Again informed pt that she was seen and cleared by psychiatry and that the hospital would not be allowed to arrange transportation to a new psychiatric facility as she was not referred to that facility from here.  Pt then asks nurse if she is free to go.  Pt did not change out of scrubs and took belongings bag with her. Pt ambulatory from unit.

## 2023-10-10 NOTE — ED Notes (Signed)
 Pt ambulates to the RR w/o assistance

## 2023-10-10 NOTE — Consult Note (Addendum)
 [facility] Psychiatric Consult Initial  Patient Name: .[name] [name]  MRN: [id number]  DOB: 05-07-1980  Consult Order details:  Orders (From admission, onward)     Start     Ordered   10/10/23 0721  CONSULT TO CALL ACT TEAM       Ordering Provider: Patsey Lot, MD  Provider:  (Not yet assigned)  Question:  Reason for Consult?  Answer:  pt reports SI   10/10/23 0721             Mode of Visit: In person    Psychiatry Consult Evaluation  Service Date: October 10, 2023 LOS:  LOS: 0 days  Chief Complaint: Suicidal ideations  Primary Psychiatric Diagnoses  Malingering 2.   Opioid use disorder, severe, dependence (HCC) 3.   Adjustment disorder with mixed disturbance of emotions and conduct  Assessment   [name] [name] is a 43 y.o. Caucasian female with a  past psychiatric history of MDD, GAD, PTSD, and polysubstance abuse (I.e., EtOH, cocaine, opiates) with use disorders (opiates), with pertinent medical comorbidities/history that include septic embolism, spinal epidural abscess with peristasis of the lower extremities, history of urinary retention, DDD, endometriosis with chronic pain, GERD, migraine headaches, obesity, and chronic lower back pain, who presents this encounter by way of EMS immediately after discharge from emergency services, notably for the third time now, due to experiencing another episode of falling multiple times at home while briefly discharged, who upon EDP medical workup and subsequent placement for discharge, gave endorsements of desire to harm herself if discharged, thus psychiatry was consulted for specialty evaluation and recommendations.  Patient is currently voluntary at this time, as well as remains medically cleared, per EDP team.  Upon evaluation, patient presents with symptomology that is most consistent with malingering, opiate use disorder, severe, dependence, and adjustment disorder with mixed disturbances of emotions and conduct.    Evidence of malingering is appreciable from patient's endorsements of vague threatening conditional suicidal ideations if discharged, with appreciable incongruent interpersonal style and presentation, that the patient endorses clearly are as a means of self preservation (I.e., maintaining temporary shelter), due to new onset homelessness and having nowhere to go.  Opiate use disorder, severe, dependence, from the patient's chronic illness course history, is appreciably very much evident during evaluation, from patient's endorsements of a desire to not address in any serious manner, her severe substance abuse of opiates such as fentanyl , as she states during examination, that she is only interested in detox protocols, due to the primary EDP team not prescribing the pain medications she is demanding, and that outside of the restrictive environment of the hospital, where she is being wrongfully den[ied] fentanyl  and other medications to control her pain, gives endorsements of no desire to stop using and abusing opiates.  While evidence is abundantly clear that the patient is malingering for secondary gains, in the context of the aforementioned desire to self preserve and maintain current temporary shelter, it is also evident that the patient is experiencing an adjustment disorder with mixed disturbances of emotions and conduct, as given during examination, the patient is very agitated and aggressive with this provider, and upset about being declared that a lesser level of care is more appropriate, versus being recommended for inpatient mental health hospitalization.  Given the above, there is no evidence to suggest reasonably that the patient is an imminent risk to herself or others, warranting the recommendation for inpatient mental health hospitalization, or more specifically, a need for higher level of care, rather  then the recommendation for a lesser level of care, and additional recommendations  listed below.  Patient endorses that she is exhibiting withdrawal symptomology from opiates, but upon physical exam, presents with no appreciable evidence of withdrawal and/or current intoxication from endorsed illicit substance use, and again, denies desire to address in any meaningful way, the patient's severe substance abuse and use.   Patient's prognosis is guarded, given the patient's inappropriate desires, refusal to entertain any resources/care measures that can be offered to the patient, both from this provider, as well as TOC social work team, malingering presentation, and desires to not meaningfully address severe substance abuse of opiates.  Spoke with Dr. Goli who is in agreement with recommendation for psychiatric clearance, as well as additional recommendations listed below.  Diagnoses:  Active Hospital problems: Principal Problem:   Malingering Active Problems:   Opioid use disorder, severe, dependence (HCC)   Adjustment disorder with mixed disturbance of emotions and conduct    Plan   #Malingering #Opioid use disorder, severe, dependence (HCC) #Adjustment disorder with mixed disturbance of emotions and conduct  ## Psychiatric Recommendations:   - Recommend continue current outpatient psychiatric medication regimen listed below --> Cymbalta  20 mg p.o. daily --> Gabapentin  100 mg p.o. 3 times daily --> Remeron  8 mg p.o. nightly - Recommend safety return precautions upon discharge - Recommend patient consider close outpatient follow-up with psychiatric medication management/therapy services, if/when amenable - Recommend patient consider substance abuse treatment, if/when amenable - Recommend patient consider abstinence from illicit substance abuse endorsed to be participating in - Recommend substance abuse resources to be given upon discharge - Recommend outpatient resources to be given upon discharge - Recommend shelter resources be given upon discharge, for if/when  patient is amenable to utilizing them  ## Medical Decision Making Capacity: Not specifically addressed in this encounter  ## Further Work-up: None at this time  ## Disposition:-- There are no psychiatric contraindications to discharge at this time  ## Behavioral / Environmental: -Routine agitation/safety precautions until discharge; safety return precautions upon discharge    ## Safety and Observation Level:  - Based on my clinical evaluation, I estimate the patient to be at low risk of self harm in the current setting and upon recommendation for discharge. - At this time, we recommend  routine. This decision is based on my review of the chart including patient's history and current presentation, interview of the patient, mental status examination, and consideration of suicide risk including evaluating suicidal ideation, plan, intent, suicidal or self-harm behaviors, risk factors, and protective factors. This judgment is based on our ability to directly address suicide risk, implement suicide prevention strategies, and develop a safety plan while the patient is in the clinical setting. Please contact our team if there is a concern that risk level has changed.  CSSR Risk Category:C-SSRS RISK CATEGORY: No Risk  Suicide Risk Assessment: Patient has following modifiable risk factors for suicide: active suicidal ideation, under treated depression , active mental illness (to encompass adhd, tbi, mania, psychosis, trauma reaction), current symptoms: anxiety/panic, insomnia, impulsivity, anhedonia, hopelessness, triggering events, pain, medical illness (ie new dx of cancer), and recent loss (death, isolation, vocation), which we are addressing by recommendations. Patient has following non-modifiable or demographic risk factors for suicide: history of suicide attempt and psychiatric hospitalization Patient has the following protective factors against suicide: Access to outpatient mental health care,  Frustration tolerance, and no history of NSSIB  Thank you for this consult request. Recommendations have been communicated to the primary team.  We will sign off at this time.   [name] JINNY Gravely, NP       History of Present Illness   [name] [name] is a 43 y.o. Caucasian female with a  past psychiatric history of MDD, GAD, PTSD, and polysubstance abuse (I.e., EtOH, cocaine, opiates) with use disorders (opiates), with pertinent medical comorbidities/history that include septic embolism, spinal epidural abscess with peristasis of the lower extremities, history of urinary retention, DDD, endometriosis with chronic pain, GERD, migraine headaches, obesity, and chronic lower back pain, who presents this encounter by way of EMS immediately after discharge from emergency services, notably for the third time now, due to experiencing another episode of falling multiple times while briefly discharged, who upon EDP medical workup and subsequent placement for discharge, gave endorsements of desire to harm herself if discharged, thus psychiatry was consulted for specialty evaluation and recommendations.  Patient is currently voluntary at this time, as well as remains medically cleared, per EDP team.  Patient seen today at the [facility] Emergency Department for face-to-face psychiatric evaluation.  Upon approach, patient is immediately agitated and intently related when speaking with this provider, and starts our engagement by giving the endorsements that the EDP team is, wrongfully denying her pain medications that she needs for her severe and chronic pain she has, which is causing her to withdrawal she states, and that they (primary EDP team) are, just trying to throw me out on my ass with nowhere to go, but I'm not going anywhere, if they try to discharge me, I'll kill myself!.  Patient redirected to calm down and clarify if she is homeless, given previous reports of the patient having her primary residence, and  also housing with her boyfriend and his parents, to which patient endorses at this time that her boyfriend and his parents she just found out are out of town, but when questioned on how she obtained this information, given that she is reported to have endorsed numerous times to staff that she has no knowledge of her boyfriend's phone number and or way of communicating with him, she states curtly and abruptly, I just fucking know!.   Patient redirected and encouraged to calm down again, to which at this time patient is asked about previous statement given to this provider of vague, conditional, and threatening expressions of suicidal ideations if they try to discharge me, to which patient states again angrily, that she has nowhere to go, and that if she is to be discharged, she will harm herself, and presents with an incongruent and angry affect, intense eye contact, and in incongruent and angry and intently related interpersonal style.   Patient denies any other psychosocial stressors abruptly, outside of new onset homelessness, and when formally directed if she had any plan or means of attempting suicide, patient vaguely gives the abrupt and curt statement that, I'll do whatever, I'm not going anywhere without somewhere to stay!. Patient redirected to discuss shelter resources/placement that have been offered to the patient, that the patient has previously and notably declined, but patient refuses to entertain this conversation, stating that she is not going to, no fucking shelter, and then states and demands that she needs, opioid detox protocols.  Prompted to expand on endorsements of need for opiate detox protocols, and if she was amenable to substance abuse treatment, patient again angrily articulates that she needs detox protocols, due to the primary EDP team not doing their job and giving her the pain medications she needs and is demanding, and that  outside of the restrictive environment  of hospital, she will continue to utilize fentanyl  and other medications that she needs she states for her pain, and rejects substance abuse treatment.   Upon examination and assessment of withdrawal symptomology, patient endorses numerous opiate withdrawal symptoms, such as nausea, abdominal pain, diarrhea, chills, malaise/fatigue, and headache, but upon objective assessment and examination, presents with no appreciable evidence of intoxication or withdrawal symptomology.  Patient endorses depression, anxiousness, insomnia, and problems with appetite, but gives no endorsements again of psychosocial stressors or rationale for the symptomology expressed, outside of again having nowhere to go and being homeless.  Patient orientation is intact, no concerns for fluctuations in consciousness.  Patient gives no endorsements of auditory or visual hallucinations, and objectively, does not appear to be presenting with psychotic features.  Patient denies homicidal ideations, but does state in demand that, all hell weill break lose if they try to kick me out.  Patient attempted to be asked a variety of other historical information and questions, but outside of endorsing that she is taking her current outpatient psychiatric medications recommended to her, begins to perseverate intently with this provider about being given answer if she will be recommended for staying in inpatient mental health hospitalization, to which at this time, discussion is had around recommendations listed above, which causes the patient to become angry and terminate our engagement.  Review of Systems  Constitutional:  Positive for chills, malaise/fatigue and weight loss.  Gastrointestinal:  Positive for abdominal pain, diarrhea and nausea.  Musculoskeletal:  Positive for back pain and myalgias.  Neurological:  Positive for headaches.  Psychiatric/Behavioral:  Positive for depression, substance abuse (Fentanyl ) and suicidal ideas  (Conditional). Negative for hallucinations. The patient is nervous/anxious and has insomnia.   All other systems reviewed and are negative.    Psychiatric and Social History  Psychiatric History:  Information collected from Chart review/patient   Prev Dx/Sx: MDD, GAD, PTSD, and polysubstance abuse (I.e., EtOH, cocaine, opiates) with use disorders (opiates) Current Psych Provider: None Home Meds (current): Ramelteon , Cymbalta , gabapentin  Previous Med Trials: Multiple Therapy: Yes, opiate pharmacological therapy and history of talk therapy  Prior Psych Hospitalization: Multiple, none in recent years  Prior Self Harm: Yes, none in recent years Prior Violence: None reported  Family Psych History: None reported Family Hx suicide: None reported  Social History:  Developmental Hx: None reported Educational Hx: None reported Occupational Hx: SSDI Legal Hx: None reported Living Situation: Abruptly homeless Spiritual Hx: None reported  Access to weapons/lethal means: None reported  Substance History Alcohol : HX of abuse Tobacco: Daily Illicit drugs: Fentanyl  and other opiates primarily; history of cocaine abuse Prescription drug abuse: Yes, history of abusing prescribed opiates Rehab hx: Multiple, none recently  Exam Findings  Physical Exam: As below  Vital Signs:  Temp:  [98.1 F (36.7 C)-99.3 F (37.4 C)] 98.2 F (36.8 C) (09/20 0735) Pulse Rate:  [83-123] 112 (09/20 0735) Resp:  [11-22] 15 (09/20 0735) BP: (134-140)/(93-101) 136/97 (09/20 0735) SpO2:  [95 %-98 %] 96 % (09/20 0735) Weight:  [97.5 kg] 97.5 kg (09/19 1709) Blood pressure (!) 136/97, pulse (!) 112, temperature 98.2 F (36.8 C), temperature source Oral, resp. rate 15, height 5' 5 (1.651 m), weight 97.5 kg, last menstrual period 08/10/2011, SpO2 96%. Body mass index is 35.77 kg/m.  Physical Exam Vitals and nursing note reviewed.  Constitutional:      General: She is not in acute distress.    Appearance:  She is obese. She is not  ill-appearing, toxic-appearing or diaphoretic.     Comments: Older than stated age, unkept, and disheveled obese female in scrubs, with incongruent and angry and intently related interpersonal style  Pulmonary:     Effort: Pulmonary effort is normal. No respiratory distress.     Comments: On RA  Skin:    General: Skin is warm and dry.  Neurological:     Mental Status: She is alert and oriented to person, place, and time.     Motor: No tremor or seizure activity.  Psychiatric:        Attention and Perception: Attention and perception normal. She does not perceive auditory or visual hallucinations.        Mood and Affect: Mood is anxious and depressed.        Behavior: Behavior is agitated.        Thought Content: Thought content is not paranoid or delusional. Thought content includes suicidal (Vague and conditional) ideation. Thought content does not include homicidal ideation. Thought content does not include suicidal plan.        Judgment: Judgment is inappropriate.     Comments: Affect: Incongruent Speech: Abrupt and curt, intently related  Judgement: intact Cognition and memory: Grossly intact Behavior: Agitated and demanding; selectively cooperative       Mental Status Exam: General Appearance: Older than stated age obese Caucasian female, with angry and intently related interpersonal style that is incongruent   Orientation:  Full (Time, Place, and Person)  Memory:  Grossly intact  Concentration: Grossly intact  Recall:  Grossly intact  Attention  Other: Grossly intact  Eye Contact:  Intense  Speech:  Largely abrupt and curt  Language:  Fair  Volume:  Normal to frequently increase  Mood: Endorses depression and anxiousness, but is appreciably incongruent, with visible anger and agitation  Affect:  Non-Congruent  Thought Process:  Coherent and Goal Directed  Thought Content:  Negative and Logical  Suicidal Thoughts: Yes, vague, conditional,  threatening  Homicidal Thoughts:  No  Judgement:  Intact, but poor  Insight:  Lacking  Psychomotor Activity: Increased amount  Akathisia:  No  Fund of Knowledge:  Grossly intact      Assets:  TEFL teacher Resilience Social Support  Cognition:  WNL  ADL's:  Intact  AIMS (if indicated):   0     Other History   These have been pulled in through the EMR, reviewed, and updated if appropriate.  Family History:  The patient's family history includes Breast cancer in her mother; Cancer in her mother; Coronary artery disease in her father and maternal grandmother.  Medical History: Past Medical History:  Diagnosis Date   Alcohol  use disorder 01/04/2012   Detox medically managed successfully during inpatient, adequate for discharge      Anxiety    klonipin for stress disorder   Chronic back pain    Cocaine abuse (HCC) 07/06/2013   Degenerative disc disease    Depression 05/22/2015   Endometriosis    chronic pain   GERD (gastroesophageal reflux disease)    protonix  evenings   Migraine    Septic embolism (HCC)    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   Urinary retention     Surgical History: Past Surgical History:  Procedure Laterality Date   ABDOMINAL HYSTERECTOMY     APPENDECTOMY     APPLICATION OF WOUND VAC Left 10/28/2019   Procedure: APPLICATION OF WOUND VAC;  Surgeon: Harden Jerona GAILS, MD;  Location: [facility] OR;  Service: Orthopedics;  Laterality: Left;   I & D EXTREMITY Left 10/22/2019   Procedure: IRRIGATION AND DEBRIDEMENT EXTREMITY; APPLICATION OF WOUND VAC;  Surgeon: Beverley Evalene BIRCH, MD;  Location: [facility] OR;  Service: Orthopedics;  Laterality: Left;   I & D EXTREMITY Left 10/26/2019   Procedure: DEBRIDEMENT LEFT TIBIA;  Surgeon: Harden Jerona GAILS, MD;  Location: [facility] OR;  Service: Orthopedics;  Laterality: Left;  left   I & D EXTREMITY Left 10/28/2019   Procedure: REPEAT DEBRIDEMENT LEFT TIBIA;  Surgeon: Harden Jerona GAILS, MD;   Location: [facility] OR;  Service: Orthopedics;  Laterality: Left;   IR THORACENTESIS ASP PLEURAL SPACE W/IMG GUIDE  10/28/2018   LAMINECTOMY FOR CEREBROSPINAL FLUID LEAK N/A 10/31/2018   Procedure: Thoracic Wound Exploration;  Surgeon: Gillie Duncans, MD;  Location: [facility] OR;  Service: Neurosurgery;  Laterality: N/A;   LAPAROSCOPIC ASSISTED VAGINAL HYSTERECTOMY  08/19/2011   Procedure: LAPAROSCOPIC ASSISTED VAGINAL HYSTERECTOMY;  Surgeon: Peggye Gull, MD;  Location: [facility] ORS;  Service: Gynecology;  Laterality: N/A;   TEE WITHOUT CARDIOVERSION N/A 10/25/2018   Procedure: TRANSESOPHAGEAL ECHOCARDIOGRAM (TEE);  Surgeon: Delford Maude BROCKS, MD;  Location: [facility] ENDOSCOPY;  Service: Cardiovascular;  Laterality: N/A;   THORACIC LAMINECTOMY FOR EPIDURAL ABSCESS N/A 10/20/2018   Procedure: THORACIC Eight, Thoracic nine LAMINECTOMY FOR EPIDURAL ABSCESS;  Surgeon: Joshua Alm RAMAN, MD;  Location: [facility] OR;  Service: Neurosurgery;  Laterality: N/A;   TONSILLECTOMY     TUBAL LIGATION       Medications:   Current Facility-Administered Medications:    DULoxetine  (CYMBALTA ) DR capsule 20 mg, 20 mg, Oral, BID, Dionisio Blunt, MD, 20 mg at 10/10/23 0115   gabapentin  (NEURONTIN ) capsule 100 mg, 100 mg, Oral, TID, Dionisio Blunt, MD   predniSONE  (DELTASONE ) tablet 40 mg, 40 mg, Oral, Q breakfast, Dionisio Blunt, MD   ramelteon  (ROZEREM ) tablet 8 mg, 8 mg, Oral, QHS, Dionisio Blunt, MD, 8 mg at 10/10/23 0004  Current Outpatient Medications:    acetaminophen  (TYLENOL ) 500 MG tablet, Take 500 mg by mouth every 6 (six) hours as needed for mild pain (pain score 1-3), headache or fever., Disp: , Rfl:    cefadroxil  (DURICEF) 500 MG capsule, Take 1 capsule (500 mg total) by mouth 2 (two) times daily for 4 days., Disp: 8 capsule, Rfl: 0   DULoxetine  (CYMBALTA ) 20 MG capsule, Take 1 capsule (20 mg total) by mouth 2 (two) times daily., Disp: 60 capsule, Rfl: 3   gabapentin  (NEURONTIN ) 100 MG capsule, Take 1 capsule (100 mg total) by  mouth 3 (three) times daily., Disp: 90 capsule, Rfl: 3   OXYGEN, Inhale 4-6 L into the lungs continuous., Disp: , Rfl:    predniSONE  (DELTASONE ) 20 MG tablet, Take 2 tablets (40 mg total) by mouth daily with breakfast for 5 days., Disp: 10 tablet, Rfl: 0   ramelteon  (ROZEREM ) 8 MG tablet, Take 1 tablet (8 mg total) by mouth at bedtime. (Patient not taking: Reported on 10/08/2023), Disp: 30 tablet, Rfl: 1  Allergies: Allergies  Allergen Reactions   Epidural Tray [id number] [Nerve Block Tray] Other (See Comments)    Paralysis and severe pain in head/neck/shoulders.  No anaphylaxis.   Ibuprofen  Hives   Penicillins Hives     Tolerated Cephalosporin 10/22/2019.   Pt has tolerated cephalosporins on past admissions. Has patient had a PCN reaction causing immediate rash, facial/tongue/throat swelling, SOB or lightheadedness with hypotension: Yes Has patient had a PCN reaction causing severe rash involving mucus membranes or skin necrosis: No Has patient had a PCN reaction that  required hospitalization No Has patient had a PCN reaction occurring within the last 10 years: No If all of the above answers are NO, then may   Zofran  [Ondansetron  Hcl] Nausea And Vomiting    [name] JINNY Gravely, NP

## 2023-10-10 NOTE — ED Notes (Signed)
 Pt  stated she was hearing voices will make RN aware
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
## 2023-10-10 NOTE — ED Notes (Signed)
 During report pt in BR called out for a nurse. Pt reports diarrhea and requesting wet washcloth.  Pt cleaned self and ambulated back to BR without assistance.

## 2023-10-10 NOTE — ED Notes (Signed)
 Pt ambulatory to BR without difficulty, reports continued diarrhea.

## 2023-10-10 NOTE — ED Notes (Signed)
 Pt ambulatory to BR without difficulty.  Reports continued diarrhea.

## 2023-10-10 NOTE — ED Notes (Signed)
 Breakfast tray provided.

## 2023-10-10 NOTE — ED Notes (Signed)
SATURATION QUALIFICATIONS: (This note is used to comply with regulatory documentation for home oxygen)  Patient Saturations on Room Air at Rest = 95%  Patient Saturations on Room Air while Ambulating = 92%   

## 2023-10-10 NOTE — ED Notes (Signed)
 Pt called nurse tech into room, pt states she is now having thoughts of hurting herself.  Pt asked for nurse and again states that she feels like if she leaves she will hurt herself.  EDP notified, pt changed into scrubs and TTS consulted.

## 2023-10-10 NOTE — ED Notes (Addendum)
 Pt yelling at staff, pt shoved bedside table against the wall.  Pt yelling that no one is trying to help me, no one is listening to me.  Pt continues to yell at this RN.  Pt asking to see psych again.  Psych and EDP notified.

## 2023-10-10 NOTE — ED Notes (Signed)
 Called back to room by pt who states she needs to be placed on an opioid detox.  States last used yesterday.

## 2023-10-10 NOTE — ED Notes (Signed)
 Dr. Pamella came to room and discussed patient's concerns.  Discussed POC of medication and discharge with pt.  After Dr. Pamella left unit, called back into room by patient who states that she doesn't understand how we can discharge her since she feels like she will hurt herself.  Explained to pt that she was seen and cleared by psychiatry and that she was deemed safe for discharge.  Pt then asks so do I just go to behavioral health.  Explained to pt that there would be information in her discharge papers for the [facility] and that if she felt unsafe in the future she could seek assistance there.  Shortly after RN returned to the desk pt called sitter to her room and began telling her that she was not safe and that she would go home and do something to harm herself.  This RN returned to room and again explained to pt the she had been seen by psychiatry and cleared, deemed safe to return home, also explained that her medical complaints had been addressed by the EDP and was cleared and pending discharge.  Pt continued to argue with RN then states I'll just check back in the ER when I'm discharged then.

## 2023-10-10 NOTE — ED Notes (Signed)
 Pt moved ready for dc after she sees case management and SW for supplemental 02 in am. Report given to [facility], during report discussed leaving periperal iv in place as it is ultra sound guided until pt is ready for dc with receiving RN. Pt provided snacks and meal again

## 2023-10-10 NOTE — ED Notes (Signed)
 Message sent to provider requesting nausea meds and sleeping med. Pt states she is feeling nauseated and would like to sleep

## 2023-10-10 NOTE — ED Notes (Signed)
 Pt sitting at end of bed playing cards with sitter.

## 2023-10-10 NOTE — ED Notes (Signed)
 Pt is requesting to speak with CN once again. Message sent to CN

## 2023-10-10 NOTE — ED Provider Notes (Signed)
 Emergency Medicine Observation Re-evaluation Note  [name] [name] is a 43 y.o. female, seen on rounds today.  Pt initially presented to the ED for complaints of Fall Currently, the patient is upright, laughing and playing cards.  Physical Exam  BP (!) 136/97   Pulse (!) 112   Temp 98.2 F (36.8 C) (Oral)   Resp 15   Ht 5' 5 (1.651 m)   Wt 97.5 kg   LMP 08/10/2011   SpO2 96%   BMI 35.77 kg/m  Physical Exam General: Awake. Alert. No acute distress Cardiac: Regular rate rhythm Lungs: Clear to auscultation bilaterally Abdomen: Soft nontender Psych: Calm and cooperative  ED Course / MDM  EKG:   I have reviewed the labs performed to date as well as medications administered while in observation.  Recent changes in the last 24 hours include medical clearance and TTS evaluation.  Plan  patient has been evaluated by TTS NP Mannie earlier this morning.  High suspicion for malingering.  Recommends continuing Cymbalta  gabapentin  Remeron  and follow-up with outpatient resources.  Cleared from psychiatric standpoint.  Patient did report abdominal pain earlier this morning to nursing staff.  Upon my initial assessment she is laughing and playing Uno with patient sitter.  When I entered the room she becomes tearful stating that her she has abdominal pain and diarrhea.  During my examination with distraction her abdomen is soft and nontender.  I do feel she would be appropriate for discharge based on a medical and psychiatric standpoint at this time.    Pamella Ozell LABOR, DO 10/10/23 1343

## 2023-10-10 NOTE — ED Notes (Signed)
 Patient admitted here for passive SI with the intent of medication overdose. On arrival, AOX4, clo hearing voices but denied it when settled down. Skin assessment done and patient has non healing left shin wound with 2 staples seen. Per pt she has more than that inside her wound which got infected and made her stayed in the ICU for 15 days. Respirations even and unlabored. Environment secured per policy. Instructed patient to let staff know if she has the intent of harming self or others. Verbalized understanding. Will continue to monitor for safety.

## 2023-10-10 NOTE — ED Notes (Signed)
 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 Patient has a surgical wound to Lt lower leg and has two staples showing. Patient has multiple track marks to bilateral upper extremities, and skin picking scars to bilateral lower legs. Patient is A&Ox4. Denies intent/thoughts to harm others when asked. Denies VH. Patient reports abdominal pain and diarrhea.No distress noted. Support and encouragement provided. Routine safety checks conducted according to facility protocol. Encouraged patient to notify staff if thoughts of harm toward self or others arise. Endorses safety. Patient verbalized understanding and agreement. Plan of care ongoing, no further concerns as of present. Patient expresses no other needs at this time.

## 2023-10-10 NOTE — ED Notes (Signed)
 Pt belongings containing a pink shirt and grey shorts placed in small locker #2.

## 2023-10-10 NOTE — ED Notes (Signed)
 PT demanding to speak with charge nurse as the doctor has not come to see her yet.  Again explained to pt that doctors and charge RN are very busy and that they will come as soon as they are available.

## 2023-10-10 NOTE — ED Notes (Signed)
 Compazine  requested and given to pt. She also requested a sheet instead of a heavy blanket, sheet given to pt

## 2023-10-11 DIAGNOSIS — F19139 Other psychoactive substance abuse with withdrawal, unspecified: Secondary | ICD-10-CM | POA: Diagnosis not present

## 2023-10-11 DIAGNOSIS — R45851 Suicidal ideations: Secondary | ICD-10-CM | POA: Diagnosis not present

## 2023-10-11 DIAGNOSIS — F411 Generalized anxiety disorder: Secondary | ICD-10-CM | POA: Diagnosis not present

## 2023-10-11 DIAGNOSIS — F319 Bipolar disorder, unspecified: Secondary | ICD-10-CM | POA: Diagnosis not present

## 2023-10-11 MED ORDER — CEFADROXIL 500 MG PO CAPS
500.0000 mg | ORAL_CAPSULE | Freq: Two times a day (BID) | ORAL | Status: DC
Start: 2023-10-11 — End: 2023-10-13
  Administered 2023-10-11 – 2023-10-12 (×3): 500 mg via ORAL
  Filled 2023-10-11 (×3): qty 1

## 2023-10-11 MED ORDER — THIAMINE MONONITRATE 100 MG PO TABS
100.0000 mg | ORAL_TABLET | Freq: Every day | ORAL | Status: DC
  Administered 2023-10-12 – 2023-10-15 (×4): 100 mg via ORAL
  Filled 2023-10-11 (×4): qty 1

## 2023-10-11 MED ORDER — THIAMINE HCL 100 MG/ML IJ SOLN
100.0000 mg | Freq: Once | INTRAMUSCULAR | Status: AC
  Administered 2023-10-11: 100 mg via INTRAMUSCULAR
  Filled 2023-10-11: qty 2

## 2023-10-11 MED ORDER — METHOCARBAMOL 500 MG PO TABS: 500.0000 mg | ORAL_TABLET | Freq: Three times a day (TID) | ORAL | Status: DC | PRN

## 2023-10-11 MED ORDER — ADULT MULTIVITAMIN W/MINERALS CH
1.0000 | ORAL_TABLET | Freq: Every day | ORAL | Status: DC
  Administered 2023-10-11 – 2023-10-15 (×5): 1 via ORAL
  Filled 2023-10-11 (×5): qty 1

## 2023-10-11 MED ORDER — CLONIDINE HCL 0.1 MG PO TABS
0.1000 mg | ORAL_TABLET | ORAL | Status: AC
Start: 2023-10-13 — End: 2023-10-15
  Administered 2023-10-13 – 2023-10-14 (×3): 0.1 mg via ORAL
  Filled 2023-10-11 (×3): qty 1

## 2023-10-11 MED ORDER — HALOPERIDOL 0.5 MG PO TABS
1.0000 mg | ORAL_TABLET | Freq: Four times a day (QID) | ORAL | Status: DC | PRN
  Administered 2023-10-11 – 2023-10-12 (×2): 1 mg via ORAL
  Filled 2023-10-11 (×2): qty 2

## 2023-10-11 MED ORDER — LORAZEPAM 1 MG PO TABS: 1.0000 mg | ORAL_TABLET | Freq: Four times a day (QID) | ORAL | Status: DC | PRN

## 2023-10-11 MED ORDER — CLONIDINE HCL 0.1 MG PO TABS
0.1000 mg | ORAL_TABLET | Freq: Every day | ORAL | Status: DC
  Administered 2023-10-15: 0.1 mg via ORAL
  Filled 2023-10-11 (×2): qty 1

## 2023-10-11 MED ORDER — HALOPERIDOL 0.5 MG PO TABS
0.5000 mg | ORAL_TABLET | Freq: Two times a day (BID) | ORAL | Status: DC
Start: 2023-10-11 — End: 2023-10-12
  Administered 2023-10-11 – 2023-10-12 (×3): 0.5 mg via ORAL
  Filled 2023-10-11 (×4): qty 1

## 2023-10-11 MED ORDER — QUETIAPINE FUMARATE 50 MG PO TABS: 50.0000 mg | ORAL_TABLET | Freq: Every day | ORAL | Status: DC

## 2023-10-11 MED ORDER — PROMETHAZINE HCL 25 MG PO TABS
25.0000 mg | ORAL_TABLET | Freq: Four times a day (QID) | ORAL | Status: DC | PRN
  Administered 2023-10-11 – 2023-10-12 (×3): 25 mg via ORAL
  Filled 2023-10-11 (×3): qty 1

## 2023-10-11 MED ORDER — PREDNISONE 20 MG PO TABS
20.0000 mg | ORAL_TABLET | Freq: Two times a day (BID) | ORAL | Status: AC
  Administered 2023-10-11 – 2023-10-14 (×6): 20 mg via ORAL
  Filled 2023-10-11 (×6): qty 1

## 2023-10-11 MED ORDER — RAMELTEON 8 MG PO TABS
8.0000 mg | ORAL_TABLET | Freq: Every day | ORAL | Status: DC
  Administered 2023-10-11 – 2023-10-14 (×4): 8 mg via ORAL
  Filled 2023-10-11 (×4): qty 1

## 2023-10-11 MED ORDER — LOPERAMIDE HCL 2 MG PO CAPS: 2.0000 mg | ORAL_CAPSULE | ORAL | Status: DC | PRN

## 2023-10-11 MED ORDER — HYDROXYZINE HCL 25 MG PO TABS: 25.0000 mg | ORAL_TABLET | Freq: Four times a day (QID) | ORAL | Status: DC | PRN

## 2023-10-11 MED ORDER — GABAPENTIN 100 MG PO CAPS
100.0000 mg | ORAL_CAPSULE | Freq: Three times a day (TID) | ORAL | Status: DC
Start: 2023-10-11 — End: 2023-10-15
  Administered 2023-10-11 – 2023-10-15 (×12): 100 mg via ORAL
  Filled 2023-10-11 (×12): qty 1

## 2023-10-11 MED ORDER — DICYCLOMINE HCL 20 MG PO TABS: 20.0000 mg | ORAL_TABLET | Freq: Four times a day (QID) | ORAL | Status: DC | PRN

## 2023-10-11 MED ORDER — CLONIDINE HCL 0.1 MG PO TABS
0.1000 mg | ORAL_TABLET | Freq: Four times a day (QID) | ORAL | Status: AC
  Administered 2023-10-11 – 2023-10-13 (×7): 0.1 mg via ORAL
  Filled 2023-10-11 (×7): qty 1

## 2023-10-11 NOTE — ED Provider Notes (Addendum)
 Facility Based Crisis Admission H&P  Date: 10/11/23 Patient Name: [name] [name] MRN: [id number] Chief Complaint: I don't feel good at all.  Diagnoses:  Final diagnoses:  Opioid use with withdrawal (HCC)  Methamphetamine dependence (HCC)  Ankylosing spondylitis of multiple sites in spine (HCC)  Severe episode of recurrent major depressive disorder, without psychotic features (HCC)  Substance induced mood disorder (HCC)  Pneumonia of both lungs due to infectious organism, unspecified part of lung  Cellulitis of lower extremity, unspecified laterality    HPI: Patient reports that she was recently in the ICU and she lost 75 lbs due to the fluid that had accumulated and that she had cellulitis and that she should be on an antibiotic for pneumonia and cellulitis. She has a history of necrotizing faucitis. She was in the ICU for 2 weeks she says and that she was released 3 days ago. She was put on Lasix  and now she feels dehydrated. She was not able sleep at all last night. She has chronic pain. She says that her legs are crawling and shaking and that she has N/V/D yesterday. They cleared the pneumonia out with antibiotics. She also says she is supposed to be taking prednisone . She had fluid in her lungs.   She reports depression that has been severe since her daughter died. She hears her daughter talking daily. I pray not to wake up every day. She died when she was 4. I hear her a lot. It makes me miss her so bad. She reports she was either going to overdose or start the car in the garage and go to sleep. I cry all the time. She does not sleep at night. I sleep a couple of hours after the sun comes up.  PHQ 2-9:  Flowsheet Row ED from 10/10/2023 in [facility] Most recent reading at 10/11/2023  1:12 PM ED from 10/10/2023 in [facility] Most recent reading at 10/10/2023  5:18 PM Office Visit from 04/06/2018 in [facility] [place] Most recent reading at 04/06/2018 11:23 AM  Thoughts that you would be better off dead, or of hurting yourself in some way Nearly every day More than half the days Not at all  PHQ-9 Total Score 27 16 14     Flowsheet Row ED from 10/10/2023 in [facility] Most recent reading at 10/11/2023  3:49 AM ED from 10/10/2023 in [facility] Most recent reading at 10/10/2023  6:27 PM ED from 10/09/2023 in [facility] Emergency Department at [facility] Most recent reading at 10/09/2023  5:13 PM  C-SSRS RISK CATEGORY Moderate Risk High Risk No Risk      Total Time spent with patient: 1 hour  Musculoskeletal  Strength & Muscle Tone: within normal limits Gait & Station: normal Patient leans: N/A  Psychiatric Specialty Exam  Presentation General Appearance:  Disheveled  Eye Contact: Good  Speech: Normal Rate; Clear and Coherent  Speech Volume: Normal  Handedness: Right   Mood and Affect  Mood: Anxious; Depressed  Affect: Congruent   Thought Process  Thought Processes: Goal Directed; Coherent  Descriptions of Associations:Intact  Orientation:Full (Time, Place and Person)  Thought Content:Logical  Diagnosis of Schizophrenia or Schizoaffective disorder in past: No   Hallucinations:Hallucinations: Auditory Description of Auditory Hallucinations: hears her daughter's voice  Ideas of Reference:None  Suicidal Thoughts:Suicidal Thoughts: Yes, Active SI Active Intent and/or Plan: With Plan; With  Intent  Homicidal Thoughts:Homicidal Thoughts: No   Sensorium  Memory: Immediate Good; Recent Good; Remote Good  Judgment: Poor  Insight: Poor   Executive Functions  Concentration: Good  Attention Span: Good  Recall: Good  Fund of Knowledge: Good  Language: Good   Psychomotor Activity  Psychomotor Activity: Psychomotor Activity: Normal   Assets   Assets: Talents/Skills; Intimacy   Sleep  Sleep: Sleep: Poor Number of Hours of Sleep: 0   Nutritional Assessment (For OBS and FBC admissions only) Has the patient had a weight loss or gain of 10 pounds or more in the last 3 months?: Yes Has the patient had a decrease in food intake/or appetite?: Yes Does the patient have dental problems?: No Does the patient have eating habits or behaviors that may be indicators of an eating disorder including binging or inducing vomiting?: No Has the patient recently lost weight without trying?: 0 Has the patient been eating poorly because of a decreased appetite?: 1 Malnutrition Screening Tool Score: 1    Physical Exam Vitals and nursing note reviewed.  Constitutional:      General: She is in acute distress.  HENT:     Head: Normocephalic and atraumatic.     Nose: Nose normal.     Mouth/Throat:     Pharynx: Oropharynx is clear.  Eyes:     Conjunctiva/sclera: Conjunctivae normal.  Pulmonary:     Effort: Pulmonary effort is normal.  Musculoskeletal:        General: Normal range of motion.     Cervical back: Normal range of motion.  Neurological:     General: No focal deficit present.     Mental Status: She is alert and oriented to person, place, and time.    Review of Systems  Constitutional:  Negative for chills and fever.  HENT:  Negative for ear pain, hearing loss and tinnitus.   Eyes:  Negative for blurred vision.  Respiratory:  Positive for shortness of breath. Negative for cough.   Cardiovascular:  Negative for chest pain.  Gastrointestinal:  Positive for abdominal pain, diarrhea, nausea and vomiting.  Genitourinary:  Negative for dysuria.  Musculoskeletal:  Negative for myalgias.  Skin:  Negative for rash.  Neurological:  Positive for headaches. Negative for dizziness.  Psychiatric/Behavioral:  Positive for depression, hallucinations, substance abuse and suicidal ideas. The patient is nervous/anxious and has insomnia.      Blood pressure (!) 132/90, pulse 72, temperature 98.7 F (37.1 C), temperature source Oral, resp. rate 18, last menstrual period 08/10/2011, SpO2 95%. There is no height or weight on file to calculate BMI.  Past Psychiatric History: Anxiety and Depression psychiatric hospitalizations in the past. PTSD  Is the patient at risk to self? Yes  Has the patient been a risk to self in the past 6 months? Yes .    Has the patient been a risk to self within the distant past? Yes   Is the patient a risk to others? No   Has the patient been a risk to others in the past 6 months? No   Has the patient been a risk to others within the distant past? No   Past Medical History: Necrotizing fascitis, pneumonia, cellulitis, 15 surgeries, Ankylosing Spondylitis She reports 50 staples in her leg that need to be removed, Spinal abscess.  Family History: Daughter died of Sepsis suddenly at age 70 from a dislocated knee. Mother bipolar disorder. She does not know her father or her father's family Social History: Divorced 2 living adult  children Homeless but may be able to live with boyfriend  Last Labs:  Admission on 10/10/2023, Discharged on 10/10/2023  Component Date Value Ref Range Status   POC Amphetamine UR 10/10/2023 None Detected  NONE DETECTED (Cut Off Level 1000 ng/mL) Corrected   POC Secobarbital (BAR) 10/10/2023 None Detected  NONE DETECTED (Cut Off Level 300 ng/mL) Corrected   POC Buprenorphine  (BUP) 10/10/2023 None Detected  NONE DETECTED (Cut Off Level 10 ng/mL) Corrected   POC Oxazepam (BZO) 10/10/2023 Positive (A)  NONE DETECTED (Cut Off Level 300 ng/mL) Corrected   POC Cocaine UR 10/10/2023 None Detected  NONE DETECTED (Cut Off Level 300 ng/mL) Corrected   POC Methamphetamine UR 10/10/2023 None Detected  NONE DETECTED (Cut Off Level 1000 ng/mL) Corrected   POC Morphine  10/10/2023 None Detected  NONE DETECTED (Cut Off Level 300 ng/mL) Corrected   POC Methadone UR 10/10/2023 None Detected   NONE DETECTED (Cut Off Level 300 ng/mL) Corrected   POC Oxycodone  UR 10/10/2023 Positive (A)  NONE DETECTED (Cut Off Level 100 ng/mL) Corrected   POC Marijuana UR 10/10/2023 None Detected  NONE DETECTED (Cut Off Level 50 ng/mL) Corrected  Admission on 10/08/2023, Discharged on 10/09/2023  Component Date Value Ref Range Status   Sodium 10/09/2023 133 (L)  135 - 145 mmol/L Final   Potassium 10/09/2023 3.6  3.5 - 5.1 mmol/L Final   Chloride 10/09/2023 104  98 - 111 mmol/L Final   CO2 10/09/2023 19 (L)  22 - 32 mmol/L Final   Glucose, Bld 10/09/2023 149 (H)  70 - 99 mg/dL Final   Glucose reference range applies only to samples taken after fasting for at least 8 hours.   BUN 10/09/2023 8  6 - 20 mg/dL Final   Creatinine, Ser 10/09/2023 0.58  0.44 - 1.00 mg/dL Final   Calcium  10/09/2023 8.6 (L)  8.9 - 10.3 mg/dL Final   Total Protein 90/80/7974 7.9  6.5 - 8.1 g/dL Final   Albumin  10/09/2023 2.8 (L)  3.5 - 5.0 g/dL Final   AST 90/80/7974 49 (H)  15 - 41 U/L Final   ALT 10/09/2023 22  0 - 44 U/L Final   Alkaline Phosphatase 10/09/2023 315 (H)  38 - 126 U/L Final   Total Bilirubin 10/09/2023 1.3 (H)  0.0 - 1.2 mg/dL Final   GFR, Estimated 10/09/2023 >60  >60 mL/min Final   Comment: (NOTE) Calculated using the CKD-EPI Creatinine Equation (2021)    Anion gap 10/09/2023 10  5 - 15 Final   Performed at [facility], [address] [place], [place] [address]   WBC 10/09/2023 8.8  4.0 - 10.5 K/uL Final   RBC 10/09/2023 3.76 (L)  3.87 - 5.11 MIL/uL Final   Hemoglobin 10/09/2023 9.9 (L)  12.0 - 15.0 g/dL Final   HCT 90/80/7974 32.0 (L)  36.0 - 46.0 % Final   MCV 10/09/2023 85.1  80.0 - 100.0 fL Final   MCH 10/09/2023 26.3  26.0 - 34.0 pg Final   MCHC 10/09/2023 30.9  30.0 - 36.0 g/dL Final   RDW 90/80/7974 20.7 (H)  11.5 - 15.5 % Final   Platelets 10/09/2023 467 (H)  150 - 400 K/uL Final   nRBC 10/09/2023 0.0  0.0 - 0.2 % Final   Neutrophils Relative % 10/09/2023 74  % Final   Neutro Abs  10/09/2023 6.4  1.7 - 7.7 K/uL Final   Lymphocytes Relative 10/09/2023 19  % Final   Lymphs Abs 10/09/2023 1.7  0.7 - 4.0  K/uL Final   Monocytes Relative 10/09/2023 6  % Final   Monocytes Absolute 10/09/2023 0.5  0.1 - 1.0 K/uL Final   Eosinophils Relative 10/09/2023 0  % Final   Eosinophils Absolute 10/09/2023 0.0  0.0 - 0.5 K/uL Final   Basophils Relative 10/09/2023 0  % Final   Basophils Absolute 10/09/2023 0.0  0.0 - 0.1 K/uL Final   Immature Granulocytes 10/09/2023 1  % Final   Abs Immature Granulocytes 10/09/2023 0.12 (H)  0.00 - 0.07 K/uL Final   Performed at [facility], [address], [place], [place] [address]   Color, Urine 10/09/2023 YELLOW  YELLOW Final   APPearance 10/09/2023 CLOUDY (A)  CLEAR Final   Specific Gravity, Urine 10/09/2023 1.016  1.005 - 1.030 Final   pH 10/09/2023 7.0  5.0 - 8.0 Final   Glucose, UA 10/09/2023 NEGATIVE  NEGATIVE mg/dL Final   Hgb urine dipstick 10/09/2023 NEGATIVE  NEGATIVE Final   Bilirubin Urine 10/09/2023 NEGATIVE  NEGATIVE Final   Ketones, ur 10/09/2023 5 (A)  NEGATIVE mg/dL Final   Protein, ur 90/80/7974 30 (A)  NEGATIVE mg/dL Final   Nitrite 90/80/7974 NEGATIVE  NEGATIVE Final   Leukocytes,Ua 10/09/2023 NEGATIVE  NEGATIVE Final   RBC / HPF 10/09/2023 0-5  0 - 5 RBC/hpf Final   WBC, UA 10/09/2023 0-5  0 - 5 WBC/hpf Final   Bacteria, UA 10/09/2023 RARE (A)  NONE SEEN Final   Squamous Epithelial / HPF 10/09/2023 0-5  0 - 5 /HPF Final   Mucus 10/09/2023 PRESENT   Final   Performed at [facility], [address], [place], [place] [address]  Admission on 10/07/2023, Discharged on 10/08/2023  Component Date Value Ref Range Status   Sodium 10/07/2023 136  135 - 145 mmol/L Final   POSSIBLE INACCURACY OF CMP RESULTS, NOTIFIED B. ORE, RN 10/07/23 2330 2330 BY K. DAVIS   Potassium 10/07/2023 3.8  3.5 - 5.1 mmol/L Final   Chloride 10/07/2023 101  98 - 111 mmol/L Final   CO2 10/07/2023 21 (L)  22 - 32 mmol/L Final   Glucose, Bld  10/07/2023 123 (H)  70 - 99 mg/dL Final   Glucose reference range applies only to samples taken after fasting for at least 8 hours.   BUN 10/07/2023 9  6 - 20 mg/dL Final   Creatinine, Ser 10/07/2023 0.60  0.44 - 1.00 mg/dL Final   Calcium  10/07/2023 9.1  8.9 - 10.3 mg/dL Final   Total Protein 90/82/7974 8.6 (H)  6.5 - 8.1 g/dL Final   Albumin  10/07/2023 3.3 (L)  3.5 - 5.0 g/dL Final   AST 90/82/7974 65 (H)  15 - 41 U/L Final   ALT 10/07/2023 18  0 - 44 U/L Final   Alkaline Phosphatase 10/07/2023 484 (H)  38 - 126 U/L Final   Total Bilirubin 10/07/2023 1.4 (H)  0.0 - 1.2 mg/dL Final   GFR, Estimated 10/07/2023 >60  >60 mL/min Final   Comment: (NOTE) Calculated using the CKD-EPI Creatinine Equation (2021)    Anion gap 10/07/2023 14  5 - 15 Final   Performed at [facility], [address], [place], [place] [address]   Lipase 10/07/2023 83 (H)  11 - 51 U/L Final   Comment: POSSIBLE INACCURACY OF LIPASE RESULTS, NOTIFIED B. ORE, RN 10/07/23 2330 BY K. DAVIS Performed at [facility], [address], [place], [place] [address]   No results displayed because visit has over 200 results.  Allergies: Epidural tray 17gx3-1-2 [nerve block tray], Ibuprofen , Penicillins, and Zofran  [ondansetron  hcl]  Medications:  Facility Ordered Medications  Medication   [COMPLETED] promethazine  (PHENERGAN ) tablet 25 mg   [COMPLETED] melatonin tablet 3 mg   [COMPLETED] prochlorperazine  (COMPAZINE ) tablet 5 mg   [COMPLETED] loperamide  (IMODIUM ) capsule 4 mg   acetaminophen  (TYLENOL ) tablet 650 mg   alum & mag hydroxide-simeth (MAALOX/MYLANTA) 200-200-20 MG/5ML suspension 30 mL   haloperidol  (HALDOL ) tablet 5 mg   And   diphenhydrAMINE  (BENADRYL ) capsule 50 mg   haloperidol  lactate (HALDOL ) injection 10 mg   And   diphenhydrAMINE  (BENADRYL ) injection 50 mg   And   LORazepam  (ATIVAN ) injection 2 mg   haloperidol  lactate (HALDOL ) injection 5 mg   And    diphenhydrAMINE  (BENADRYL ) injection 50 mg   And   LORazepam  (ATIVAN ) injection 2 mg   magnesium  hydroxide (MILK OF MAGNESIA) suspension 30 mL   traZODone  (DESYREL ) tablet 50 mg   dicyclomine  (BENTYL ) tablet 20 mg   hydrOXYzine  (ATARAX ) tablet 25 mg   loperamide  (IMODIUM ) capsule 2-4 mg   methocarbamol  (ROBAXIN ) tablet 500 mg   cloNIDine  (CATAPRES ) tablet 0.1 mg   Followed by   NOREEN ON 10/13/2023] cloNIDine  (CATAPRES ) tablet 0.1 mg   Followed by   NOREEN ON 10/15/2023] cloNIDine  (CATAPRES ) tablet 0.1 mg   QUEtiapine  (SEROQUEL ) tablet 50 mg   promethazine  (PHENERGAN ) tablet 25 mg   thiamine  (VITAMIN B1) injection 100 mg   [START ON 10/12/2023] thiamine  (VITAMIN B1) tablet 100 mg   multivitamin with minerals tablet 1 tablet   LORazepam  (ATIVAN ) tablet 1 mg    Long Term Goals: Improvement in symptoms so as ready for discharge  Short Term Goals: Patient will verbalize feelings in meetings with treatment team members.  Medical Decision Making  43 year old female with a personal history of abandonment by father, divorce and sudden death of 48 year old daughter and  psych hx of MDD, PTSD, Opiate, Opoid dep and methamphetamine dep and psychiatric hospitalizations and multiple complicated medical problems presents with suicidal ideation and desire for detox.      Labs Lab Results        CBC with Differential/Platelet   low hgb and elevated platlets      Comprehensive metabolic panel   AST 49 ALT 22 T Bili 1.3      Hemoglobin A1c   ordered      Ethanol   not ordered      Lipid panel   ordered      TSH   ordered      POCT Urine Drug Screen - benzo and oxycocodone EKG borderline QTC prolongation 493    AUD Ativan  1 mg po every 6 hour prn for alcohol  withdrawal greater than 10 CIWA to monitor for benzo withdrawal  1300 CIWA 0 Pt is denying any withdrawal symptoms.  COWs For Opoid withdrawal Clonidine  taper    MDD with psychotic features Medication regimen Pt reports previously  taking Seroquel  due to prolonged QTC will use the Ramelteon  instead for sleep that patient was discharged with Pt complaining of hearing voice of her daughter. She took Haldol  in the past trial of Haldol  0.5 mg BID Check TSH Consider antidepressant-after withdrawal symptoms have subsided Duloxetine  discharged with 20 mg bid after ICU stay 10/06/23-consider restarting  Pneumonia and Cellulitis Cefadroxil  is what she was discharged on. Check with ID regarding whether and how long this needs to be continued.   Continue with unit protocol of safety maintenance and  regular checks Normal unit milieu and safety checks Groups that provide education and support for mental health education and abstinence promotion.     Recommendations  Based on my evaluation the patient does not appear to have an emergency medical condition.  Garvin JINNY Gaines, MD 10/11/23  1:40 PM

## 2023-10-11 NOTE — Group Note (Signed)
 Group Topic: Relaxation  Group Date: 10/11/2023 Start Time: 1745 End Time: 1815 Facilitators: Penney Domanski, Zane HERO, RN; Sherleen Primmer, RN  Department: [facility]  Number of Participants: 5  Group Focus: relaxation Treatment Modality:  Individual Therapy Interventions utilized were support Purpose: reinforce self-care, explore coping skills  Name: [name] [name] Date of Birth: [birth year]  MR: [id number]    Level of Participation: moderate Quality of Participation: attentive, cooperative, and engaged Interactions with others: gave feedback Mood/Affect: appropriate Triggers (if applicable): None identified at this time Cognition: coherent/clear and logical Progress: Gaining insight Response: Patient enjoyed fresh air and worked on Brewing technologist. Social support provided with staff and peers.  Plan: patient will be encouraged to continue to attend groups/programming on the unit  Patients Problems:  Patient Active Problem List   Diagnosis Date Noted   Adjustment disorder with mixed disturbance of emotions and conduct 10/10/2023   Bipolar I disorder (HCC) 10/10/2023   Infectious process 10/03/2023   Malingering 10/03/2023   CAP (community acquired pneumonia) 09/30/2023   Acute respiratory failure with hypoxia (HCC) 09/29/2023   Elevated LFTs 09/26/2023   Cellulitis of both lower extremities 09/26/2023   Normocytic anemia 09/26/2023   Protein-calorie malnutrition, severe (HCC) 09/26/2023   Necrotizing fasciitis due to Streptococcus pyogenes (HCC)    Wound infection 10/21/2019   Cellulitis 10/21/2019   Substance abuse (HCC) 10/21/2019   Anxiety 10/21/2019   Fever    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

## 2023-10-11 NOTE — ED Notes (Signed)
 Patient stated she did not want her dinner as she didn't like the options. Writer informed patient she could speak with the MHT in regards to options but needed to eat due to medications which would upset her stomach if she didn't. Patient refused to speak with MHT stating she's rude and said she didn't care if her stomach got upset. Patient has already taken nausea medication this afternoon and is not able to have additional nausea meds until 1947 per order. Patient stormed into room and shut the door. Writer went to patient's room to inform her the door cannot be closed. Patient very loudly slammed door after being told we have to keep the doors cracked as we have q15 min rounding that has to be completed. Patient remains irritable. MHT spoke with patient in regards to leftover lunch trays with different food options. Patient agreed to eat.

## 2023-10-11 NOTE — ED Notes (Signed)
Pt in dayroom at this hour. No apparent distress. RR even and unlabored. Monitored for safety.  

## 2023-10-11 NOTE — ED Notes (Signed)
 Pt asleep at this hour. No apparent distress. RR even and unlabored. Monitored for safety.

## 2023-10-11 NOTE — ED Notes (Signed)
 Patient alert & oriented x4. Denies intent to harm self or others when asked. Denies A/VH. Patient reports pain in lower back and bilateral legs rating 10/10. PRN Tylenol  administered with no complications. No acute distress noted. Patient intrusive, repeatedly asking same questions regarding medication (patient is requesting to be started on methadone, writer informed patient we do not start patients on methadone at this facility, only continue it if a patient was already taking the medication prior to admission and staff is able to verify this with outside clinic/pharmacy) as well as when the doctor will be rounding Hydrographic surveyor informed patient that the providers have a 0900 meeting and then will come round, Clinical research associate can not provide a specific time of when provider will speak with patient. Support and encouragement provided. Patient observed in milieu. Routine safety checks conducted per facility protocol. Encouraged patient to notify staff if any thoughts of harm towards self or others arise. Patient verbalizes understanding and agreement.

## 2023-10-11 NOTE — Group Note (Signed)
 Group Topic: Recovery Basics  Group Date: 10/11/2023 Start Time: 0100 End Time: 0200 Facilitators: Nicholaus Arlyne BIRCH, NT  Department: [facility]  Number of Participants: 7  Group Focus: coping skills Treatment Modality:  Psychoeducation Interventions utilized were problem solving Purpose: increase insight  Name: [name] [name] Date of Birth: August 30, 1980  MR: [id number]    Level of Participation: none Quality of Participation: N/A Interactions with others: N/A Mood/Affect: N/A Triggers (if applicable): N/A Cognition: N/A Progress: N/A Response:  Plan: patient will be encouraged to participate  Patients Problems:  Patient Active Problem List   Diagnosis Date Noted   Adjustment disorder with mixed disturbance of emotions and conduct 10/10/2023   Bipolar I disorder (HCC) 10/10/2023   Infectious process 10/03/2023   Malingering 10/03/2023   CAP (community acquired pneumonia) 09/30/2023   Acute respiratory failure with hypoxia (HCC) 09/29/2023   Elevated LFTs 09/26/2023   Cellulitis of both lower extremities 09/26/2023   Normocytic anemia 09/26/2023   Protein-calorie malnutrition, severe (HCC) 09/26/2023   Necrotizing fasciitis due to Streptococcus pyogenes (HCC)    Wound infection 10/21/2019   Cellulitis 10/21/2019   Substance abuse (HCC) 10/21/2019   Anxiety 10/21/2019   Fever    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

## 2023-10-11 NOTE — ED Notes (Signed)
 Patient sitting in dayroom interacting with peers. No acute distress noted. No concerns voiced. No inappropriate behaviors observed or reported at this time. Informed patient to notify staff with any needs or assistance. Patient verbalized understanding or agreement. Safety checks in place per facility policy.

## 2023-10-11 NOTE — ED Notes (Signed)
 Patient is resting comfortably.

## 2023-10-11 NOTE — ED Notes (Signed)
 Patient is up at the moment c/o acid reflux

## 2023-10-12 DIAGNOSIS — F19139 Other psychoactive substance abuse with withdrawal, unspecified: Secondary | ICD-10-CM | POA: Diagnosis not present

## 2023-10-12 DIAGNOSIS — F319 Bipolar disorder, unspecified: Secondary | ICD-10-CM | POA: Diagnosis not present

## 2023-10-12 DIAGNOSIS — R45851 Suicidal ideations: Secondary | ICD-10-CM | POA: Diagnosis not present

## 2023-10-12 DIAGNOSIS — F411 Generalized anxiety disorder: Secondary | ICD-10-CM | POA: Diagnosis not present

## 2023-10-12 LAB — LIPID PANEL
Cholesterol: 203 mg/dL — ABNORMAL HIGH (ref 0–200)
HDL: 52 mg/dL (ref >40)
LDL Cholesterol: 99 mg/dL (ref 0–99)
Total CHOL/HDL Ratio: 3.9 ratio
Triglycerides: 259 mg/dL — ABNORMAL HIGH (ref <150)
VLDL: 52 mg/dL — ABNORMAL HIGH (ref 0–40)

## 2023-10-12 LAB — COMPREHENSIVE METABOLIC PANEL WITH GFR
ALT: 36 U/L (ref 0–44)
AST: 73 U/L — ABNORMAL HIGH (ref 15–41)
Albumin: 3.3 g/dL — ABNORMAL LOW (ref 3.5–5.0)
Alkaline Phosphatase: 284 U/L — ABNORMAL HIGH (ref 38–126)
Anion gap: 10 (ref 5–15)
BUN: 13 mg/dL (ref 6–20)
CO2: 19 mmol/L — ABNORMAL LOW (ref 22–32)
Calcium: 8.9 mg/dL (ref 8.9–10.3)
Chloride: 105 mmol/L (ref 98–111)
Creatinine, Ser: 0.63 mg/dL (ref 0.44–1.00)
GFR, Estimated: >60 mL/min (ref >60)
Glucose, Bld: 167 mg/dL — ABNORMAL HIGH (ref 70–99)
Potassium: 3.9 mmol/L (ref 3.5–5.1)
Sodium: 134 mmol/L — ABNORMAL LOW (ref 135–145)
Total Bilirubin: 1 mg/dL (ref 0.0–1.2)
Total Protein: 8.2 g/dL — ABNORMAL HIGH (ref 6.5–8.1)

## 2023-10-12 LAB — CBC WITH DIFFERENTIAL/PLATELET
Abs Immature Granulocytes: 0.05 K/uL (ref 0.00–0.07)
Basophils Absolute: 0 K/uL (ref 0.0–0.1)
Basophils Relative: 0 %
Eosinophils Absolute: 0.1 K/uL (ref 0.0–0.5)
Eosinophils Relative: 1 %
HCT: 34.5 % — ABNORMAL LOW (ref 36.0–46.0)
Hemoglobin: 11.2 g/dL — ABNORMAL LOW (ref 12.0–15.0)
Immature Granulocytes: 1 %
Lymphocytes Relative: 26 %
Lymphs Abs: 2.1 K/uL (ref 0.7–4.0)
MCH: 26.8 pg (ref 26.0–34.0)
MCHC: 32.5 g/dL (ref 30.0–36.0)
MCV: 82.5 fL (ref 80.0–100.0)
Monocytes Absolute: 0.6 K/uL (ref 0.1–1.0)
Monocytes Relative: 8 %
Neutro Abs: 5.2 K/uL (ref 1.7–7.7)
Neutrophils Relative %: 64 %
Platelets: 535 K/uL — ABNORMAL HIGH (ref 150–400)
RBC: 4.18 MIL/uL (ref 3.87–5.11)
RDW: 20.2 % — ABNORMAL HIGH (ref 11.5–15.5)
WBC: 8.1 K/uL (ref 4.0–10.5)
nRBC: 0 % (ref 0.0–0.2)

## 2023-10-12 LAB — TSH: TSH: 1.102 u[IU]/mL (ref 0.350–4.500)

## 2023-10-12 LAB — VITAMIN D 25 HYDROXY (VIT D DEFICIENCY, FRACTURES): Vit D, 25-Hydroxy: 20.01 ng/mL — ABNORMAL LOW (ref 30–100)

## 2023-10-12 LAB — HEMOGLOBIN A1C
Hgb A1c MFr Bld: 4 % — ABNORMAL LOW (ref 4.8–5.6)
Mean Plasma Glucose: 68.1 mg/dL

## 2023-10-12 LAB — VITAMIN B12: Vitamin B-12: 380 pg/mL (ref 180–914)

## 2023-10-12 MED ORDER — VITAMIN D 25 MCG (1000 UNIT) PO TABS
2000.0000 [IU] | ORAL_TABLET | Freq: Every day | ORAL | Status: DC
  Administered 2023-10-13 – 2023-10-15 (×3): 2000 [IU] via ORAL
  Filled 2023-10-12 (×3): qty 2

## 2023-10-12 MED ORDER — HALOPERIDOL 0.5 MG PO TABS
0.5000 mg | ORAL_TABLET | Freq: Three times a day (TID) | ORAL | Status: DC
  Administered 2023-10-12 – 2023-10-13 (×5): 0.5 mg via ORAL
  Filled 2023-10-12 (×6): qty 1

## 2023-10-12 NOTE — Group Note (Signed)
 Group Topic: Understanding Self  Group Date: 10/12/2023 Start Time: 1715 End Time: 1740 Facilitators: Daved Tinnie HERO, RN  Department: [facility]  Number of Participants: 7  Group Focus: self-awareness Treatment Modality:  Psychoeducation Interventions utilized were exploration and patient education Purpose: self exploration  Name: [name] [name] Date of Birth: 08-22-1980  MR: [id number]    Level of Participation: pt did not attend group Plan: patient will be encouraged to attend future RN education groups  Patients Problems:  Patient Active Problem List   Diagnosis Date Noted   Adjustment disorder with mixed disturbance of emotions and conduct 10/10/2023   Bipolar I disorder (HCC) 10/10/2023   Infectious process 10/03/2023   Malingering 10/03/2023   CAP (community acquired pneumonia) 09/30/2023   Acute respiratory failure with hypoxia (HCC) 09/29/2023   Elevated LFTs 09/26/2023   Cellulitis of both lower extremities 09/26/2023   Normocytic anemia 09/26/2023   Protein-calorie malnutrition, severe 09/26/2023   Necrotizing fasciitis due to Streptococcus pyogenes (HCC)    Wound infection 10/21/2019   Cellulitis 10/21/2019   Substance abuse (HCC) 10/21/2019   Anxiety 10/21/2019   Fever    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

## 2023-10-12 NOTE — Care Management (Signed)
 [facility] Care Management   Writer met with the patient and discussed discharge planning.   Patient reports that she needs to be discharged before October 15, 2023 because she is being evicted.   Patient declined inpatient substance abuse and intensive outpatient substance abuse treatment.  Patient reports that     Patient reports prior suicidal thoughts of taking her car in the garage and letting the engine run until she dies.   Patient reports that she continues to hear the voice of her dead daughter.

## 2023-10-12 NOTE — ED Notes (Addendum)
 Patient appears to be malingering. Endorsed thoughts of self-harm. When asked what precipitated these thoughts, pt states my daughter's voice in my head. Pt states her daughter talks to her. Asks if the previous dose of Haldol  can be restarted. When pt was advised that the provider would speak with her in the morning, she then asked if she could have medicine for a migraine. This nurse asked pt if she would like to try Tylenol . Pt stated do you have the cocktail with benadryl . Pt has not made staff aware that she was suffering from a migraine at any time throughout the day. When this nurse advised pt that we will not administer a migraine cocktail pt begin to ask for haldol , again, for the voices, my anxiety, psychiatric problems. Scheduled clonidine  was given. Pt then asked if she could have Seroquel . States she was prescribed this med before. Pt advised we would let the night shift nurses aware of her requests. Pt does not appear to be in distress. We will continue to monitor for safety.

## 2023-10-12 NOTE — ED Notes (Signed)
 Pt asleep at this hour. No apparent distress. RR even and unlabored. Monitored for safety.

## 2023-10-12 NOTE — ED Notes (Signed)
 Pt awake and reporting hearing AH. Also reports heartburn. Given PRN medications as appropriate.

## 2023-10-12 NOTE — ED Notes (Signed)
 Labs sent to [facility] two lavender, two light green, two yellow, one red, with the patient's labels and the Order Requisition with patient's name on it.

## 2023-10-12 NOTE — ED Notes (Signed)
 Patient refused Urinalysis she said they got that earlier, she refused to give more urine, explained it was needed the doctor ordered it. She still refused.

## 2023-10-12 NOTE — ED Notes (Addendum)
 Patient in bed for most of the morning. After labs were drawn today, pt c/o lightheadedness which resolved after resting. Pt endorses auditory hallucinations of hearing her daughter's voice. She endorses SI with no plan. Denies HI or visual hallucinations. Pt endorses feeling depressed. There are two visible staples to her lower left leg. Wound is healed with no discharge. Pt with minimal interaction with staff and peers. She denies any additional needs at this time. We will continue to monitor for safety.

## 2023-10-12 NOTE — Group Note (Signed)
 Group Topic: Wellness  Group Date: 10/12/2023 Start Time: 8069 End Time: 2000 Facilitators: Anice Benton LABOR, NT  Department: [facility]  Number of Participants: 3  Group Focus: check in Treatment Modality:  Individual Therapy Interventions utilized were support Purpose: express feelings  Name: [name] [name] Date of Birth: March 15, 1980  MR: [id number]    Level of Participation: Did Not Attend  Quality of Participation: N/A Interactions with others: N/A Mood/Affect: N/A Triggers (if applicable): N/A Cognition: N/A Progress: N/A Response: N/A Plan: patient will be encouraged to attend group.  Patients Problems:  Patient Active Problem List   Diagnosis Date Noted   Adjustment disorder with mixed disturbance of emotions and conduct 10/10/2023   Bipolar I disorder (HCC) 10/10/2023   Infectious process 10/03/2023   Malingering 10/03/2023   CAP (community acquired pneumonia) 09/30/2023   Acute respiratory failure with hypoxia (HCC) 09/29/2023   Elevated LFTs 09/26/2023   Cellulitis of both lower extremities 09/26/2023   Normocytic anemia 09/26/2023   Protein-calorie malnutrition, severe 09/26/2023   Necrotizing fasciitis due to Streptococcus pyogenes (HCC)    Wound infection 10/21/2019   Cellulitis 10/21/2019   Substance abuse (HCC) 10/21/2019   Anxiety 10/21/2019   Fever    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

## 2023-10-12 NOTE — Progress Notes (Signed)
 Pt still c/o heartburn. I drank some milk. That should help.

## 2023-10-12 NOTE — ED Provider Notes (Signed)
 Behavioral Health Progress Note  Date and Time: 10/12/2023 11:48 AM Name: [name] [name] MRN:  [id number]  Subjective:  I just don't feel like talking now  Diagnosis:  Final diagnoses:  Opioid use with withdrawal (HCC)  Methamphetamine dependence (HCC)  Ankylosing spondylitis of multiple sites in spine (HCC)  Severe episode of recurrent major depressive disorder, without psychotic features (HCC)  Substance induced mood disorder (HCC)  Pneumonia of both lungs due to infectious organism, unspecified part of lung  Cellulitis of lower extremity, unspecified laterality   Total Time spent with patient: 50 minute evaluating, chart review, complex management of medically related problems, medication management, care coordination   [name] [name] is a 43 year old female, with complicated medical history secondary to chronic opioid use. Patient hospitalized from 09/25/2023-9/162025 after presenting to ED due to abdominal pain, fever, and subsequently developing SOB which resulted in hypoxia and finding of pneumonia. Pt is currently receiving antibiotic therapy with cefadroxil  500 mg BID, for resolution of pneumonia.  Patient seen today during rounds and denies any shortness of breath, weakness or chest tightness.  Patient endorses she does not feel well due to generally feeling sick.  Patient denies any specific area of pain however this is likely secondary to her withdrawal from opiates.  Patient is currently on a clonidine  taper for treatment of opiate withdrawal.  Blood pressure stable.  Patient has complained of some lightheadedness however she has minimally been up and walking she has been mostly in her room and in the bed.  She has been eating but not drinking very much fluids.  Nursing has been encouraging patient's to force fluids.  Patient was interviewed by social work earlier this morning and stated she did not want to talk and that she was not ever rested in residential treatment.  On evaluation  with this writer patient stated that she may be interested in outpatient CD IOP however did not want to discuss very much with me as she further stated that she did not feel like talking.  Patient has been uncooperative with obtaining EKG and initially uncooperative to having labs drawn.  However patient did consent to having her labs drawn earlier today which the results are currently pending.  Patient has not engaged in any group therapy since admission to the unit.  Patient does deny any suicidal homicidal ideations.  Patient is afebrile vital signs are stable.  Will encourage patient to engage in group activities and request that nursing staff and encourage patient to engage in milieu activities.     Past Psychiatric History: Anxiety and Depression psychiatric hospitalizations in the past. PTSD. Self reports hx of Bipolar disorder-admitting provider ordered Haldol  0.5 mg BID for reported AH 10/12/23 Denies AH/VH Past Medical History: Necrotizing fascitis, pneumonia, cellulitis, 15 surgeries, Ankylosing Spondylitis She reports 50 staples in her leg that need to be removed, Spinal abscess.  Family History: Daughter died of Sepsis suddenly at age 62 from a dislocated knee. Mother bipolar disorder. She does not know her father or her father's family Social History: Divorced 2 living adult children Homeless but may be able to live with boyfriend                         Sleep: Good  Appetite:  Poor  Current Medications:  Current Facility-Administered Medications  Medication Dose Route Frequency Provider Last Rate Last Admin   acetaminophen  (TYLENOL ) tablet 650 mg  650 mg Oral Q6H PRN Randall Starlyn HERO, NP  650 mg at 10/11/23 2037   alum & mag hydroxide-simeth (MAALOX/MYLANTA) 200-200-20 MG/5ML suspension 30 mL  30 mL Oral Q4H PRN Randall Starlyn HERO, NP   30 mL at 10/12/23 0344   cefadroxil  (DURICEF) capsule 500 mg  500 mg Oral BID Gottfried, Rhoda J, MD   500 mg at 10/11/23 2102    cloNIDine  (CATAPRES ) tablet 0.1 mg  0.1 mg Oral QID Gottfried, Rhoda J, MD   0.1 mg at 10/12/23 9065   Followed by   NOREEN ON 10/13/2023] cloNIDine  (CATAPRES ) tablet 0.1 mg  0.1 mg Oral BH-qamhs Gottfried, Rhoda J, MD       Followed by   NOREEN ON 10/15/2023] cloNIDine  (CATAPRES ) tablet 0.1 mg  0.1 mg Oral QAC breakfast Gottfried, Rhoda J, MD       dicyclomine  (BENTYL ) tablet 20 mg  20 mg Oral Q6H PRN Randall Starlyn HERO, NP   20 mg at 10/10/23 2133   haloperidol  (HALDOL ) tablet 5 mg  5 mg Oral TID PRN Randall Starlyn HERO, NP   5 mg at 10/10/23 2229   And   diphenhydrAMINE  (BENADRYL ) capsule 50 mg  50 mg Oral TID PRN Randall Starlyn HERO, NP   50 mg at 10/10/23 2229   haloperidol  lactate (HALDOL ) injection 10 mg  10 mg Intramuscular TID PRN Randall Starlyn HERO, NP       And   diphenhydrAMINE  (BENADRYL ) injection 50 mg  50 mg Intramuscular TID PRN Randall Starlyn HERO, NP       And   LORazepam  (ATIVAN ) injection 2 mg  2 mg Intramuscular TID PRN Randall Starlyn HERO, NP       haloperidol  lactate (HALDOL ) injection 5 mg  5 mg Intramuscular TID PRN Randall Starlyn HERO, NP       And   diphenhydrAMINE  (BENADRYL ) injection 50 mg  50 mg Intramuscular TID PRN Randall Starlyn HERO, NP       And   LORazepam  (ATIVAN ) injection 2 mg  2 mg Intramuscular TID PRN Randall Starlyn HERO, NP       gabapentin  (NEURONTIN ) capsule 100 mg  100 mg Oral TID Gottfried, Rhoda J, MD   100 mg at 10/12/23 9065   haloperidol  (HALDOL ) tablet 0.5 mg  0.5 mg Oral BID Gottfried, Rhoda J, MD   0.5 mg at 10/12/23 9065   haloperidol  (HALDOL ) tablet 1 mg  1 mg Oral Q6H PRN Gottfried, Rhoda J, MD   1 mg at 10/12/23 0344   hydrOXYzine  (ATARAX ) tablet 25 mg  25 mg Oral Q6H PRN Randall Starlyn HERO, NP   25 mg at 10/11/23 2037   loperamide  (IMODIUM ) capsule 2-4 mg  2-4 mg Oral PRN Randall Starlyn HERO, NP       LORazepam  (ATIVAN ) tablet 1 mg  1 mg Oral Q6H PRN Gottfried, Rhoda J, MD       magnesium  hydroxide (MILK OF  MAGNESIA) suspension 30 mL  30 mL Oral Daily PRN Randall Starlyn HERO, NP       methocarbamol  (ROBAXIN ) tablet 500 mg  500 mg Oral Q8H PRN Randall, Veronique M, NP   500 mg at 10/10/23 2133   multivitamin with minerals tablet 1 tablet  1 tablet Oral Daily Gottfried, Rhoda J, MD   1 tablet at 10/12/23 0934   predniSONE  (DELTASONE ) tablet 20 mg  20 mg Oral BID WC Gottfried, Rhoda J, MD   20 mg at 10/12/23 0934   promethazine  (PHENERGAN ) tablet 25 mg  25 mg Oral Q6H PRN Gottfried, Rhoda J, MD  25 mg at 10/11/23 2037   ramelteon  (ROZEREM ) tablet 8 mg  8 mg Oral QHS Gottfried, Rhoda J, MD   8 mg at 10/11/23 2102   thiamine  (VITAMIN B1) tablet 100 mg  100 mg Oral Daily Gottfried, Rhoda J, MD   100 mg at 10/12/23 9065   traZODone  (DESYREL ) tablet 50 mg  50 mg Oral QHS PRN Byungura, Veronique M, NP   50 mg at 10/10/23 2133   No current outpatient medications on file.    Labs  Lab Results:  Admission on 10/10/2023  Component Date Value Ref Range Status   Hgb A1c MFr Bld 10/12/2023 4.0 (L)  4.8 - 5.6 % Final   Comment: (NOTE) Diagnosis of Diabetes The following HbA1c ranges recommended by the American Diabetes Association (ADA) may be used as an aid in the diagnosis of diabetes mellitus.  Hemoglobin             Suggested A1C NGSP%              Diagnosis  <5.7                   Non Diabetic  5.7-6.4                Pre-Diabetic  >6.4                   Diabetic  <7.0                   Glycemic control for                       adults with diabetes.     Mean Plasma Glucose 10/12/2023 68.1  mg/dL Final   Performed at [facility], [address], [place], [place] [address]   WBC 10/12/2023 8.1  4.0 - 10.5 K/uL Final   RBC 10/12/2023 4.18  3.87 - 5.11 MIL/uL Final   Hemoglobin 10/12/2023 11.2 (L)  12.0 - 15.0 g/dL Final   HCT 90/77/7974 34.5 (L)  36.0 - 46.0 % Final   MCV 10/12/2023 82.5  80.0 - 100.0 fL Final   MCH 10/12/2023 26.8  26.0 - 34.0 pg Final   MCHC 10/12/2023 32.5   30.0 - 36.0 g/dL Final   RDW 90/77/7974 20.2 (H)  11.5 - 15.5 % Final   Platelets 10/12/2023 535 (H)  150 - 400 K/uL Final   nRBC 10/12/2023 0.0  0.0 - 0.2 % Final   Neutrophils Relative % 10/12/2023 64  % Final   Neutro Abs 10/12/2023 5.2  1.7 - 7.7 K/uL Final   Lymphocytes Relative 10/12/2023 26  % Final   Lymphs Abs 10/12/2023 2.1  0.7 - 4.0 K/uL Final   Monocytes Relative 10/12/2023 8  % Final   Monocytes Absolute 10/12/2023 0.6  0.1 - 1.0 K/uL Final   Eosinophils Relative 10/12/2023 1  % Final   Eosinophils Absolute 10/12/2023 0.1  0.0 - 0.5 K/uL Final   Basophils Relative 10/12/2023 0  % Final   Basophils Absolute 10/12/2023 0.0  0.0 - 0.1 K/uL Final   Immature Granulocytes 10/12/2023 1  % Final   Abs Immature Granulocytes 10/12/2023 0.05  0.00 - 0.07 K/uL Final   Performed at [facility], [address], [place], [place] [address]  Admission on 10/10/2023, Discharged on 10/10/2023  Component Date Value Ref Range Status   POC Amphetamine UR 10/10/2023 None Detected  NONE DETECTED (Cut Off Level  1000 ng/mL) Corrected   POC Secobarbital (BAR) 10/10/2023 None Detected  NONE DETECTED (Cut Off Level 300 ng/mL) Corrected   POC Buprenorphine  (BUP) 10/10/2023 None Detected  NONE DETECTED (Cut Off Level 10 ng/mL) Corrected   POC Oxazepam (BZO) 10/10/2023 Positive (A)  NONE DETECTED (Cut Off Level 300 ng/mL) Corrected   POC Cocaine UR 10/10/2023 None Detected  NONE DETECTED (Cut Off Level 300 ng/mL) Corrected   POC Methamphetamine UR 10/10/2023 None Detected  NONE DETECTED (Cut Off Level 1000 ng/mL) Corrected   POC Morphine  10/10/2023 None Detected  NONE DETECTED (Cut Off Level 300 ng/mL) Corrected   POC Methadone UR 10/10/2023 None Detected  NONE DETECTED (Cut Off Level 300 ng/mL) Corrected   POC Oxycodone  UR 10/10/2023 Positive (A)  NONE DETECTED (Cut Off Level 100 ng/mL) Corrected   POC Marijuana UR 10/10/2023 None Detected  NONE DETECTED (Cut Off Level 50 ng/mL) Corrected   Admission on 10/08/2023, Discharged on 10/09/2023  Component Date Value Ref Range Status   Sodium 10/09/2023 133 (L)  135 - 145 mmol/L Final   Potassium 10/09/2023 3.6  3.5 - 5.1 mmol/L Final   Chloride 10/09/2023 104  98 - 111 mmol/L Final   CO2 10/09/2023 19 (L)  22 - 32 mmol/L Final   Glucose, Bld 10/09/2023 149 (H)  70 - 99 mg/dL Final   Glucose reference range applies only to samples taken after fasting for at least 8 hours.   BUN 10/09/2023 8  6 - 20 mg/dL Final   Creatinine, Ser 10/09/2023 0.58  0.44 - 1.00 mg/dL Final   Calcium  10/09/2023 8.6 (L)  8.9 - 10.3 mg/dL Final   Total Protein 90/80/7974 7.9  6.5 - 8.1 g/dL Final   Albumin  10/09/2023 2.8 (L)  3.5 - 5.0 g/dL Final   AST 90/80/7974 49 (H)  15 - 41 U/L Final   ALT 10/09/2023 22  0 - 44 U/L Final   Alkaline Phosphatase 10/09/2023 315 (H)  38 - 126 U/L Final   Total Bilirubin 10/09/2023 1.3 (H)  0.0 - 1.2 mg/dL Final   GFR, Estimated 10/09/2023 >60  >60 mL/min Final   Comment: (NOTE) Calculated using the CKD-EPI Creatinine Equation (2021)    Anion gap 10/09/2023 10  5 - 15 Final   Performed at [facility], [address] [place], [place] [address]   WBC 10/09/2023 8.8  4.0 - 10.5 K/uL Final   RBC 10/09/2023 3.76 (L)  3.87 - 5.11 MIL/uL Final   Hemoglobin 10/09/2023 9.9 (L)  12.0 - 15.0 g/dL Final   HCT 90/80/7974 32.0 (L)  36.0 - 46.0 % Final   MCV 10/09/2023 85.1  80.0 - 100.0 fL Final   MCH 10/09/2023 26.3  26.0 - 34.0 pg Final   MCHC 10/09/2023 30.9  30.0 - 36.0 g/dL Final   RDW 90/80/7974 20.7 (H)  11.5 - 15.5 % Final   Platelets 10/09/2023 467 (H)  150 - 400 K/uL Final   nRBC 10/09/2023 0.0  0.0 - 0.2 % Final   Neutrophils Relative % 10/09/2023 74  % Final   Neutro Abs 10/09/2023 6.4  1.7 - 7.7 K/uL Final   Lymphocytes Relative 10/09/2023 19  % Final   Lymphs Abs 10/09/2023 1.7  0.7 - 4.0 K/uL Final   Monocytes Relative 10/09/2023 6  % Final   Monocytes Absolute 10/09/2023 0.5  0.1 - 1.0 K/uL Final    Eosinophils Relative 10/09/2023 0  % Final   Eosinophils Absolute 10/09/2023 0.0  0.0 -  0.5 K/uL Final   Basophils Relative 10/09/2023 0  % Final   Basophils Absolute 10/09/2023 0.0  0.0 - 0.1 K/uL Final   Immature Granulocytes 10/09/2023 1  % Final   Abs Immature Granulocytes 10/09/2023 0.12 (H)  0.00 - 0.07 K/uL Final   Performed at [facility], [address], [place], [place] [address]   Color, Urine 10/09/2023 YELLOW  YELLOW Final   APPearance 10/09/2023 CLOUDY (A)  CLEAR Final   Specific Gravity, Urine 10/09/2023 1.016  1.005 - 1.030 Final   pH 10/09/2023 7.0  5.0 - 8.0 Final   Glucose, UA 10/09/2023 NEGATIVE  NEGATIVE mg/dL Final   Hgb urine dipstick 10/09/2023 NEGATIVE  NEGATIVE Final   Bilirubin Urine 10/09/2023 NEGATIVE  NEGATIVE Final   Ketones, ur 10/09/2023 5 (A)  NEGATIVE mg/dL Final   Protein, ur 90/80/7974 30 (A)  NEGATIVE mg/dL Final   Nitrite 90/80/7974 NEGATIVE  NEGATIVE Final   Leukocytes,Ua 10/09/2023 NEGATIVE  NEGATIVE Final   RBC / HPF 10/09/2023 0-5  0 - 5 RBC/hpf Final   WBC, UA 10/09/2023 0-5  0 - 5 WBC/hpf Final   Bacteria, UA 10/09/2023 RARE (A)  NONE SEEN Final   Squamous Epithelial / HPF 10/09/2023 0-5  0 - 5 /HPF Final   Mucus 10/09/2023 PRESENT   Final   Performed at [facility], [address], [place], [place] [address]  Admission on 10/07/2023, Discharged on 10/08/2023  Component Date Value Ref Range Status   Sodium 10/07/2023 136  135 - 145 mmol/L Final   POSSIBLE INACCURACY OF CMP RESULTS, NOTIFIED B. ORE, RN 10/07/23 2330 2330 BY K. DAVIS   Potassium 10/07/2023 3.8  3.5 - 5.1 mmol/L Final   Chloride 10/07/2023 101  98 - 111 mmol/L Final   CO2 10/07/2023 21 (L)  22 - 32 mmol/L Final   Glucose, Bld 10/07/2023 123 (H)  70 - 99 mg/dL Final   Glucose reference range applies only to samples taken after fasting for at least 8 hours.   BUN 10/07/2023 9  6 - 20 mg/dL Final   Creatinine, Ser 10/07/2023 0.60  0.44 - 1.00 mg/dL Final    Calcium  10/07/2023 9.1  8.9 - 10.3 mg/dL Final   Total Protein 90/82/7974 8.6 (H)  6.5 - 8.1 g/dL Final   Albumin  10/07/2023 3.3 (L)  3.5 - 5.0 g/dL Final   AST 90/82/7974 65 (H)  15 - 41 U/L Final   ALT 10/07/2023 18  0 - 44 U/L Final   Alkaline Phosphatase 10/07/2023 484 (H)  38 - 126 U/L Final   Total Bilirubin 10/07/2023 1.4 (H)  0.0 - 1.2 mg/dL Final   GFR, Estimated 10/07/2023 >60  >60 mL/min Final   Comment: (NOTE) Calculated using the CKD-EPI Creatinine Equation (2021)    Anion gap 10/07/2023 14  5 - 15 Final   Performed at [facility], [address] [place], [place] [address]   Lipase 10/07/2023 83 (H)  11 - 51 U/L Final   Comment: POSSIBLE INACCURACY OF LIPASE RESULTS, NOTIFIED B. ORE, RN 10/07/23 2330 BY K. DAVIS Performed at [facility], [address] [place], [place] [address]   No results displayed because visit has over 200 results.      Blood Alcohol  level:  Lab Results  Component Value Date   [facility] <15 09/26/2023   ETH <5 05/22/2015    Metabolic Disorder Labs: Lab Results  Component Value Date   HGBA1C 4.0 (L)  10/12/2023   MPG 68.1 10/12/2023   MPG 114.02 10/30/2019   No results found for: PROLACTIN Lab Results  Component Value Date   CHOL 205 (H) 12/29/2011   TRIG 269 (H) 12/29/2011   HDL 28 (L) 12/29/2011   CHOLHDL 7.3 12/29/2011   VLDL 54 (H) 12/29/2011   LDLCALC 123 (H) 12/29/2011    Therapeutic Lab Levels:  Physical Findings   AIMS    Flowsheet Row [facility] (Discharged[facility]  AIMS Total Score 0 0   AUDIT    Flowsheet Row [facility]) [facility]-Admission (Discharged) [facility] (Discharged) [facility]  Alcohol  Use Disorder Identification Test Final Score (AUDIT) 0 2 0   GAD-7    Flowsheet Row Office Visit from 12/29/2017 in [facility] - [facility] Dept Of [place]. [facility] Office Visit from 12/01/2017 in [facility] - [facility] Of [place]. [facility]  Total GAD-7 Score 15 15   PHQ2-9    Flowsheet Row ED from 10/10/2023 in [facility] Most recent reading at 10/11/2023  1:12 PM ED from 10/10/2023 in [facility] Most recent reading at 10/10/2023  5:18 PM Office Visit from 04/06/2018 in [facility] - A Dept Of [place]. [facility] Most recent reading at 04/06/2018 11:23 AM Office Visit from 03/23/2018 in [facility] - A Dept Of [place]. [facility] Most recent reading at 03/23/2018 11:49 AM Office Visit from 02/02/2018 in [facility] - A Dept Of [place]. [facility] Most recent reading at 02/02/2018 11:55 AM  PHQ-2 Total Score 6 4 3 3 4   PHQ-9 Total Score 27 16 14 14 16    Flowsheet Row ED from 10/10/2023 in [facility] Most recent reading at 10/11/2023  3:49 AM ED from 10/10/2023 in [facility] Most recent reading at 10/10/2023  6:27 PM ED from 10/09/2023 in [facility] Department at [facility] Most recent reading at 10/09/2023  5:13 PM  C-SSRS RISK CATEGORY Moderate Risk High Risk No Risk     Musculoskeletal  Strength & Muscle Tone: within normal limits Gait & Station: normal Patient leans: N/A  Psychiatric Specialty Exam  Presentation  General Appearance:  Disheveled  Eye Contact: Good  Speech: Normal Rate; Clear and Coherent  Speech Volume: Normal  Handedness: Right   Mood and Affect  Mood: Anxious; Depressed  Affect: Congruent   Thought Process  Thought  Processes: Goal Directed; Coherent  Descriptions of Associations:Intact  Orientation:Full (Time, Place and Person)  Thought Content:Logical  Diagnosis of Schizophrenia or Schizoaffective disorder in past: No    Hallucinations:Hallucinations: Auditory Description of Auditory Hallucinations: hears her daughter's voice  Ideas of Reference:None  Suicidal Thoughts:Suicidal Thoughts: Yes, Active SI Active Intent and/or Plan: With Plan; With Intent  Homicidal Thoughts:Homicidal Thoughts: No   Sensorium  Memory: Immediate Good; Recent Good; Remote Good  Judgment: Poor  Insight: Poor   Executive Functions  Concentration: Good  Attention Span: Good  Recall: Good  Fund of Knowledge: Good  Language: Good   Psychomotor Activity  Psychomotor Activity: Psychomotor Activity:  Normal   Assets  Assets: Talents/Skills; Intimacy   Sleep  Sleep: Sleep: Poor  Estimated Sleeping Duration (Last 24 Hours): 13.75-15.75 hours  Nutritional Assessment (For OBS and FBC admissions only) Has the patient had a weight loss or gain of 10 pounds or more in the last 3 months?: Yes Has the patient had a decrease in food intake/or appetite?: Yes Does the patient have dental problems?: No Does the patient have eating habits or behaviors that may be indicators of an eating disorder including binging or inducing vomiting?: No Has the patient recently lost weight without trying?: 0 Has the patient been eating poorly because of a decreased appetite?: 1 Malnutrition Screening Tool Score: 1    Physical Exam  Physical Exam Constitutional:      General: She is not in acute distress.    Appearance: She is ill-appearing. She is not toxic-appearing.  HENT:     Head: Normocephalic.  Cardiovascular:     Rate and Rhythm: Regular rhythm. Tachycardia present.  Pulmonary:     Effort: Pulmonary effort is normal.     Breath sounds: Normal breath sounds.  Musculoskeletal:     Cervical  back: Normal range of motion.  Neurological:     General: No focal deficit present.     Mental Status: She is alert.    Review of Systems  Psychiatric/Behavioral:  Positive for substance abuse. The patient is nervous/anxious.    Blood pressure (!) 149/111, pulse (!) 102, temperature 97.7 F (36.5 C), temperature source Oral, resp. rate 19, last menstrual period 08/10/2011, SpO2 98%. There is no height or weight on file to calculate BMI.  Treatment Plan Summary: Daily contact with patient to assess and evaluate symptoms and progress in treatment, Medication management, and Plan : Patient is minimally engaging with treatment team today and wanting to sleep and complaining of not feeling well although not specifically indicating any area of discomfort.  Will reevaluate patient tomorrow however she is unwilling to engage in treatment on day 3 patient will likely be discharged with outpatient follow-up.  Labs pending for this encounter:  TSH, due to worsening depression and rule out hypothyroidism Lipid, standard lab ordered for all patients received an antipsychotic therapy Hemoglobin A1c, standard lab ordered for all patients received an antipsychotic therapy B12 level, rule out B12 deficiency as a contributor to inattentiveness  CBC, stander to rule out any infection or anemia CMP, rule out metabolic conditions which may be contributing to mental health symptoms UDS, standard order, rule out substance induced mood vs altered mental status related to substance use    Opioid use with withdrawal (HCC), continue COWS and Clonidine  taper. Monitor BP closely  Methamphetamine dependence (HCC), start Remeron , pt has poor appetite and recent hx of protein malnutrition, albumin  decreased and increased protein-Remeron  15 mg , goal of treatment to improve appetite, mood, and improve sleep.    Ankylosing spondylitis of multiple sites in spine (HCC)  Severe episode of recurrent major depressive  disorder, without psychotic features (HCC) Substance induced mood disorder (HCC) -Increased Haldol  0.5 mg TID and discontinued scheduled PRN Haldol  (keeping agitation protocol) NEED ECG-encourage patient to complete   Pneumonia of both lungs due to infectious organism, unspecified part of lung, continue cefadroxil  500 mg-BID CBC Reassuring, WBC  8.1  Cellulitis of lower extremity, unspecified laterality, continue cefadroxil  500 mg, No obvious acute infection   Vitamin D  deficiency , replaced with D3 2,000 mg daily   Encourage patient to engage in group and milieu activities. SW  to assist with outpatient follow-up.  Estimate Discharge: 10/14/2023     Suzen Lesches, NP 10/12/2023 11:48 AM

## 2023-10-12 NOTE — ED Notes (Signed)
 Patient is in the bedroom calm and composed. NAD. Denies any SI/HI/AVH. Respirations are even and unlabored. Environment clean and secured per policy. Will monitor for safety.

## 2023-10-13 DIAGNOSIS — F411 Generalized anxiety disorder: Secondary | ICD-10-CM | POA: Diagnosis not present

## 2023-10-13 DIAGNOSIS — F19139 Other psychoactive substance abuse with withdrawal, unspecified: Secondary | ICD-10-CM | POA: Diagnosis not present

## 2023-10-13 DIAGNOSIS — R45851 Suicidal ideations: Secondary | ICD-10-CM | POA: Diagnosis not present

## 2023-10-13 DIAGNOSIS — F319 Bipolar disorder, unspecified: Secondary | ICD-10-CM | POA: Diagnosis not present

## 2023-10-13 MED ORDER — BACITRACIN ZINC 500 UNIT/GM EX OINT: 1.0000 | TOPICAL_OINTMENT | CUTANEOUS | Status: DC | PRN

## 2023-10-13 MED ORDER — DOXYCYCLINE HYCLATE 100 MG PO TABS
100.0000 mg | ORAL_TABLET | Freq: Two times a day (BID) | ORAL | Status: DC
  Administered 2023-10-13: 100 mg via ORAL
  Filled 2023-10-13: qty 1

## 2023-10-13 MED ORDER — CEFDINIR 300 MG PO CAPS
300.0000 mg | ORAL_CAPSULE | Freq: Two times a day (BID) | ORAL | Status: DC
  Administered 2023-10-13 – 2023-10-15 (×5): 300 mg via ORAL
  Filled 2023-10-13 (×7): qty 1

## 2023-10-13 NOTE — Group Note (Signed)
 Group Topic: Overcoming Obstacles  Group Date: 10/13/2023 Start Time: 1015 End Time: 1100 Facilitators: Alyse Leilani LABOR, NT  Department: [facility]  Number of Participants: 8  Group Focus: abuse issues, coping skills, and safety plan Treatment Modality:  Interpersonal Therapy Interventions utilized were patient education, problem solving, and reality testing Purpose: enhance coping skills, express feelings, and regain self-worth  Name: [name] [name] Date of Birth: 1980-03-31  MR: [id number]    Level of Participation: minimal Quality of Participation: engaged Interactions with others: gave feedback Mood/Affect: anxious Triggers (if applicable): none Cognition: coherent/clear Progress: Gaining insight Response: none Plan: patient will be encouraged to keep attending group  Patients Problems:  Patient Active Problem List   Diagnosis Date Noted   Adjustment disorder with mixed disturbance of emotions and conduct 10/10/2023   Bipolar I disorder (HCC) 10/10/2023   Infectious process 10/03/2023   Malingering 10/03/2023   CAP (community acquired pneumonia) 09/30/2023   Acute respiratory failure with hypoxia (HCC) 09/29/2023   Elevated LFTs 09/26/2023   Cellulitis of both lower extremities 09/26/2023   Normocytic anemia 09/26/2023   Protein-calorie malnutrition, severe 09/26/2023   Necrotizing fasciitis due to Streptococcus pyogenes (HCC)    Wound infection 10/21/2019   Cellulitis 10/21/2019   Substance abuse (HCC) 10/21/2019   Anxiety 10/21/2019   Fever    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

## 2023-10-13 NOTE — Care Management (Signed)
 [facility] Care Management   Writer met with the patient and discussed discharge planning.  Patient requests outpatient services with [facility] [id number]  Writer spoke to Jasmine and she will have someone to call the patient back in 3 days.    Writer referred the patient to the [facility] program.

## 2023-10-13 NOTE — ED Provider Notes (Signed)
 Behavioral Health Progress Note  Date and Time: 10/14/2023 12:37 PM Name: [name] [name] MRN:  [id number]  Subjective:    Diagnosis:  Final diagnoses:  Opioid use with withdrawal (HCC)  Methamphetamine dependence (HCC)  Ankylosing spondylitis of multiple sites in spine (HCC)  Severe episode of recurrent major depressive disorder, without psychotic features (HCC)  Substance induced mood disorder (HCC)  Pneumonia of both lungs due to infectious organism, unspecified part of lung  Cellulitis of lower extremity, unspecified laterality  Vitamin D  deficiency   Total Time spent with patient: 30 minutes [name] [name] is a 43 year old female, with complicated medical history secondary to chronic opioid use. Patient hospitalized from 09/25/2023-9/162025 after presenting to ED due to abdominal pain, fever, and subsequently developing SOB which resulted in hypoxia and finding of pneumonia.  Patient was seen today in rounds continues to exhibit labile behavior.  Patient has been frequently requesting medications (Haldol  and Milk of Magnesia ).  Patient continues to eat only a fair amount of food and drink minimally.  This Clinical research associate did speak with patient today to again encouraged her to drink more fluids as she remains slightly tachycardic which is likely secondary to poor volume intake.  Patient is encouraged to drink Gatorade.  Nursing order placed for [facility] patient to have Gatorade every 4 hours.  Patient reports sleeping however sleeping is intermittently and she is frequently waking up.  Patient encouraged to interact within the milieu and attend groups today.  Patient continues to endorse that she hears the voice of her deceased daughter however this is chronic and has been going on for years.  She denies any suicidal ideations or homicidal ideations today.  She continues to endorse feeling depressed.  Patient will follow-up in the [facility] outpatient program along with plans to reach out to [facility] as she  wishes to treat opiate addiction with Suboxone .  Additional Social History:    Sleep: Poor  Appetite:  Fair  Current Medications:  Current Facility-Administered Medications  Medication Dose Route Frequency Provider Last Rate Last Admin   acetaminophen  (TYLENOL ) tablet 650 mg  650 mg Oral Q6H PRN Randall Starlyn HERO, NP   650 mg at 10/14/23 0944   alum & mag hydroxide-simeth (MAALOX/MYLANTA) 200-200-20 MG/5ML suspension 30 mL  30 mL Oral Q4H PRN Randall Starlyn HERO, NP   30 mL at 10/12/23 2112   bacitracin  ointment 1 Application  1 Application Topical PRN Arloa Suzen RAMAN, NP       cefdinir  (OMNICEF ) capsule 300 mg  300 mg Oral Q12H Arloa Suzen RAMAN, NP   300 mg at 10/14/23 9066   cholecalciferol  (VITAMIN D3) 25 MCG (1000 UNIT) tablet 2,000 Units  2,000 Units Oral Daily Arloa Suzen RAMAN, NP   2,000 Units at 10/14/23 0900   cloNIDine  (CATAPRES ) tablet 0.1 mg  0.1 mg Oral BH-qamhs Gottfried, Rhoda J, MD   0.1 mg at 10/14/23 0900   Followed by   NOREEN ON 10/15/2023] cloNIDine  (CATAPRES ) tablet 0.1 mg  0.1 mg Oral QAC breakfast Gottfried, Rhoda J, MD       dicyclomine  (BENTYL ) tablet 20 mg  20 mg Oral Q6H PRN Randall Starlyn HERO, NP   20 mg at 10/10/23 2133   haloperidol  (HALDOL ) tablet 5 mg  5 mg Oral TID PRN Randall Starlyn HERO, NP   5 mg at 10/10/23 2229   And   diphenhydrAMINE  (BENADRYL ) capsule 50 mg  50 mg Oral TID PRN Randall Starlyn HERO, NP   50 mg at 10/10/23  2229   haloperidol  lactate (HALDOL ) injection 10 mg  10 mg Intramuscular TID PRN Randall Starlyn HERO, NP       And   diphenhydrAMINE  (BENADRYL ) injection 50 mg  50 mg Intramuscular TID PRN Randall Starlyn HERO, NP       And   LORazepam  (ATIVAN ) injection 2 mg  2 mg Intramuscular TID PRN Randall Starlyn HERO, NP       haloperidol  lactate (HALDOL ) injection 5 mg  5 mg Intramuscular TID PRN Randall Starlyn HERO, NP       And   diphenhydrAMINE  (BENADRYL ) injection 50 mg  50 mg Intramuscular TID PRN Randall Starlyn HERO, NP       And   LORazepam  (ATIVAN ) injection 2 mg  2 mg Intramuscular TID PRN Randall, Veronique M, NP       gabapentin  (NEURONTIN ) capsule 100 mg  100 mg Oral TID Gottfried, Rhoda J, MD   100 mg at 10/14/23 0931   hydrOXYzine  (ATARAX ) tablet 25 mg  25 mg Oral Q6H PRN Randall Starlyn HERO, NP   25 mg at 10/12/23 2110   loperamide  (IMODIUM ) capsule 2-4 mg  2-4 mg Oral PRN Randall Starlyn HERO, NP   4 mg at 10/13/23 1059   magnesium  hydroxide (MILK OF MAGNESIA) suspension 30 mL  30 mL Oral Daily PRN Randall Starlyn HERO, NP       methocarbamol  (ROBAXIN ) tablet 500 mg  500 mg Oral Q8H PRN Randall, Veronique M, NP   500 mg at 10/10/23 2133   mirtazapine  (REMERON  SOL-TAB) disintegrating tablet 15 mg  15 mg Oral QHS Arloa Suzen RAMAN, NP       multivitamin with minerals tablet 1 tablet  1 tablet Oral Daily Gottfried, Rhoda J, MD   1 tablet at 10/14/23 9068   OLANZapine  zydis (ZYPREXA ) disintegrating tablet 5 mg  5 mg Oral BID Arloa Suzen RAMAN, NP   5 mg at 10/14/23 0941   promethazine  (PHENERGAN ) tablet 25 mg  25 mg Oral Q6H PRN Gottfried, Rhoda J, MD   25 mg at 10/12/23 1228   ramelteon  (ROZEREM ) tablet 8 mg  8 mg Oral QHS Gottfried, Rhoda J, MD   8 mg at 10/13/23 2125   thiamine  (VITAMIN B1) tablet 100 mg  100 mg Oral Daily Gottfried, Rhoda J, MD   100 mg at 10/14/23 9068   traZODone  (DESYREL ) tablet 50 mg  50 mg Oral QHS PRN Byungura, Veronique M, NP   50 mg at 10/13/23 2125   No current outpatient medications on file.    Labs  Lab Results:  Admission on 10/10/2023  Component Date Value Ref Range Status   Hgb A1c MFr Bld 10/12/2023 4.0 (L)  4.8 - 5.6 % Final   Comment: (NOTE) Diagnosis of Diabetes The following HbA1c ranges recommended by the American Diabetes Association (ADA) may be used as an aid in the diagnosis of diabetes mellitus.  Hemoglobin             Suggested A1C NGSP%              Diagnosis  <5.7                   Non Diabetic  5.7-6.4                 Pre-Diabetic  >6.4                   Diabetic  <7.0  Glycemic control for                       adults with diabetes.     Mean Plasma Glucose 10/12/2023 68.1  mg/dL Final   Performed at [facility], [address] [place], [place] [address]   Cholesterol 10/12/2023 203 (H)  0 - 200 mg/dL Final   Triglycerides 90/77/7974 259 (H)  <150 mg/dL Final   HDL 90/77/7974 52  >40 mg/dL Final   Total CHOL/HDL Ratio 10/12/2023 3.9  RATIO Final   VLDL 10/12/2023 52 (H)  0 - 40 mg/dL Final   LDL Cholesterol 10/12/2023 99  0 - 99 mg/dL Final   Comment:        Total Cholesterol/HDL:CHD Risk Coronary Heart Disease Risk Table                     Men   Women  1/2 Average Risk   3.4   3.3  Average Risk       5.0   4.4  2 X Average Risk   9.6   7.1  3 X Average Risk  23.4   11.0        Use the calculated Patient Ratio above and the CHD Risk Table to determine the patient's CHD Risk.        ATP III CLASSIFICATION (LDL):  <100     mg/dL   Optimal  899-870  mg/dL   Near or Above                    Optimal  130-159  mg/dL   Borderline  839-810  mg/dL   High  >809     mg/dL   Very High Performed at [facility], [address], [place], [place] [address]    WBC 10/12/2023 8.1  4.0 - 10.5 K/uL Final   RBC 10/12/2023 4.18  3.87 - 5.11 MIL/uL Final   Hemoglobin 10/12/2023 11.2 (L)  12.0 - 15.0 g/dL Final   HCT 90/77/7974 34.5 (L)  36.0 - 46.0 % Final   MCV 10/12/2023 82.5  80.0 - 100.0 fL Final   MCH 10/12/2023 26.8  26.0 - 34.0 pg Final   MCHC 10/12/2023 32.5  30.0 - 36.0 g/dL Final   RDW 90/77/7974 20.2 (H)  11.5 - 15.5 % Final   Platelets 10/12/2023 535 (H)  150 - 400 K/uL Final   nRBC 10/12/2023 0.0  0.0 - 0.2 % Final   Neutrophils Relative % 10/12/2023 64  % Final   Neutro Abs 10/12/2023 5.2  1.7 - 7.7 K/uL Final   Lymphocytes Relative 10/12/2023 26  % Final   Lymphs Abs 10/12/2023 2.1  0.7 - 4.0 K/uL Final   Monocytes Relative 10/12/2023 8  % Final    Monocytes Absolute 10/12/2023 0.6  0.1 - 1.0 K/uL Final   Eosinophils Relative 10/12/2023 1  % Final   Eosinophils Absolute 10/12/2023 0.1  0.0 - 0.5 K/uL Final   Basophils Relative 10/12/2023 0  % Final   Basophils Absolute 10/12/2023 0.0  0.0 - 0.1 K/uL Final   Immature Granulocytes 10/12/2023 1  % Final   Abs Immature Granulocytes 10/12/2023 0.05  0.00 - 0.07 K/uL Final   Performed at [facility], [address] [place], [place] [address]   Sodium 10/12/2023 134 (L)  135 - 145 mmol/L Final   Potassium 10/12/2023 3.9  3.5 - 5.1 mmol/L Final  Chloride 10/12/2023 105  98 - 111 mmol/L Final   CO2 10/12/2023 19 (L)  22 - 32 mmol/L Final   Glucose, Bld 10/12/2023 167 (H)  70 - 99 mg/dL Final   Glucose reference range applies only to samples taken after fasting for at least 8 hours.   BUN 10/12/2023 13  6 - 20 mg/dL Final   Creatinine, Ser 10/12/2023 0.63  0.44 - 1.00 mg/dL Final   Calcium  10/12/2023 8.9  8.9 - 10.3 mg/dL Final   Total Protein 90/77/7974 8.2 (H)  6.5 - 8.1 g/dL Final   Albumin  10/12/2023 3.3 (L)  3.5 - 5.0 g/dL Final   AST 90/77/7974 73 (H)  15 - 41 U/L Final   ALT 10/12/2023 36  0 - 44 U/L Final   Alkaline Phosphatase 10/12/2023 284 (H)  38 - 126 U/L Final   Total Bilirubin 10/12/2023 1.0  0.0 - 1.2 mg/dL Final   GFR, Estimated 10/12/2023 >60  >60 mL/min Final   Comment: (NOTE) Calculated using the CKD-EPI Creatinine Equation (2021)    Anion gap 10/12/2023 10  5 - 15 Final   Performed at [facility], [address] [place], [place] [address]   TSH 10/12/2023 1.102  0.350 - 4.500 uIU/mL Final   Comment: Performed by a 3rd Generation assay with a functional sensitivity of <=0.01 uIU/mL. Performed at [facility], [address], [place], [place] [address]    Vit D, 25-Hydroxy 10/12/2023 20.01 (L)  30 - 100 ng/mL Final   Comment: (NOTE) Vitamin D  deficiency has been defined by the Institute of Medicine  and an Endocrine Society practice guideline  as a level of serum 25-OH  vitamin D  less than 20 ng/mL (1,2). The Endocrine Society went on to  further define vitamin D  insufficiency as a level between 21 and 29  ng/mL (2).  1. IOM (Institute of Medicine). 2010. Dietary reference intakes for  calcium  and D. [place]: [employer]. 2. Holick MF, Binkley [place], Bischoff-Ferrari HA, et al. Evaluation,  treatment, and prevention of vitamin D  deficiency: an Endocrine  Society clinical practice guideline, JCEM. 2011 Jul; 96(7): 1911-30.  Performed at [facility], [address] [place], [place] [address]    Vitamin B-12 10/12/2023 380  180 - 914 pg/mL Final   Comment: (NOTE) This assay is not validated for testing neonatal or myeloproliferative syndrome specimens for Vitamin B12 levels. Performed at [facility], [address] [place], [place] [address]   Admission on 10/10/2023, Discharged on 10/10/2023  Component Date Value Ref Range Status   POC Amphetamine UR 10/10/2023 None Detected  NONE DETECTED (Cut Off Level 1000 ng/mL) Corrected   POC Secobarbital (BAR) 10/10/2023 None Detected  NONE DETECTED (Cut Off Level 300 ng/mL) Corrected   POC Buprenorphine  (BUP) 10/10/2023 None Detected  NONE DETECTED (Cut Off Level 10 ng/mL) Corrected   POC Oxazepam (BZO) 10/10/2023 Positive (A)  NONE DETECTED (Cut Off Level 300 ng/mL) Corrected   POC Cocaine UR 10/10/2023 None Detected  NONE DETECTED (Cut Off Level 300 ng/mL) Corrected   POC Methamphetamine UR 10/10/2023 None Detected  NONE DETECTED (Cut Off Level 1000 ng/mL) Corrected   POC Morphine  10/10/2023 None Detected  NONE DETECTED (Cut Off Level 300 ng/mL) Corrected   POC Methadone UR 10/10/2023 None Detected  NONE DETECTED (Cut Off Level 300 ng/mL) Corrected   POC Oxycodone  UR 10/10/2023 Positive (A)  NONE DETECTED (Cut Off Level 100 ng/mL) Corrected  POC Marijuana UR 10/10/2023 None Detected  NONE DETECTED (Cut Off Level 50 ng/mL) Corrected  Admission on  10/08/2023, Discharged on 10/09/2023  Component Date Value Ref Range Status   Sodium 10/09/2023 133 (L)  135 - 145 mmol/L Final   Potassium 10/09/2023 3.6  3.5 - 5.1 mmol/L Final   Chloride 10/09/2023 104  98 - 111 mmol/L Final   CO2 10/09/2023 19 (L)  22 - 32 mmol/L Final   Glucose, Bld 10/09/2023 149 (H)  70 - 99 mg/dL Final   Glucose reference range applies only to samples taken after fasting for at least 8 hours.   BUN 10/09/2023 8  6 - 20 mg/dL Final   Creatinine, Ser 10/09/2023 0.58  0.44 - 1.00 mg/dL Final   Calcium  10/09/2023 8.6 (L)  8.9 - 10.3 mg/dL Final   Total Protein 90/80/7974 7.9  6.5 - 8.1 g/dL Final   Albumin  10/09/2023 2.8 (L)  3.5 - 5.0 g/dL Final   AST 90/80/7974 49 (H)  15 - 41 U/L Final   ALT 10/09/2023 22  0 - 44 U/L Final   Alkaline Phosphatase 10/09/2023 315 (H)  38 - 126 U/L Final   Total Bilirubin 10/09/2023 1.3 (H)  0.0 - 1.2 mg/dL Final   GFR, Estimated 10/09/2023 >60  >60 mL/min Final   Comment: (NOTE) Calculated using the CKD-EPI Creatinine Equation (2021)    Anion gap 10/09/2023 10  5 - 15 Final   Performed at [facility], [address], [place], [place] [address]   WBC 10/09/2023 8.8  4.0 - 10.5 K/uL Final   RBC 10/09/2023 3.76 (L)  3.87 - 5.11 MIL/uL Final   Hemoglobin 10/09/2023 9.9 (L)  12.0 - 15.0 g/dL Final   HCT 90/80/7974 32.0 (L)  36.0 - 46.0 % Final   MCV 10/09/2023 85.1  80.0 - 100.0 fL Final   MCH 10/09/2023 26.3  26.0 - 34.0 pg Final   MCHC 10/09/2023 30.9  30.0 - 36.0 g/dL Final   RDW 90/80/7974 20.7 (H)  11.5 - 15.5 % Final   Platelets 10/09/2023 467 (H)  150 - 400 K/uL Final   nRBC 10/09/2023 0.0  0.0 - 0.2 % Final   Neutrophils Relative % 10/09/2023 74  % Final   Neutro Abs 10/09/2023 6.4  1.7 - 7.7 K/uL Final   Lymphocytes Relative 10/09/2023 19  % Final   Lymphs Abs 10/09/2023 1.7  0.7 - 4.0 K/uL Final   Monocytes Relative 10/09/2023 6  % Final   Monocytes Absolute 10/09/2023 0.5  0.1 - 1.0 K/uL Final   Eosinophils  Relative 10/09/2023 0  % Final   Eosinophils Absolute 10/09/2023 0.0  0.0 - 0.5 K/uL Final   Basophils Relative 10/09/2023 0  % Final   Basophils Absolute 10/09/2023 0.0  0.0 - 0.1 K/uL Final   Immature Granulocytes 10/09/2023 1  % Final   Abs Immature Granulocytes 10/09/2023 0.12 (H)  0.00 - 0.07 K/uL Final   Performed at [facility], [address], [place], [place] [address]   Color, Urine 10/09/2023 YELLOW  YELLOW Final   APPearance 10/09/2023 CLOUDY (A)  CLEAR Final   Specific Gravity, Urine 10/09/2023 1.016  1.005 - 1.030 Final   pH 10/09/2023 7.0  5.0 - 8.0 Final   Glucose, UA 10/09/2023 NEGATIVE  NEGATIVE mg/dL Final   Hgb urine dipstick 10/09/2023 NEGATIVE  NEGATIVE Final   Bilirubin Urine 10/09/2023 NEGATIVE  NEGATIVE Final   Ketones, ur 10/09/2023 5 (A)  NEGATIVE  mg/dL Final   Protein, ur 90/80/7974 30 (A)  NEGATIVE mg/dL Final   Nitrite 90/80/7974 NEGATIVE  NEGATIVE Final   Leukocytes,Ua 10/09/2023 NEGATIVE  NEGATIVE Final   RBC / HPF 10/09/2023 0-5  0 - 5 RBC/hpf Final   WBC, UA 10/09/2023 0-5  0 - 5 WBC/hpf Final   Bacteria, UA 10/09/2023 RARE (A)  NONE SEEN Final   Squamous Epithelial / HPF 10/09/2023 0-5  0 - 5 /HPF Final   Mucus 10/09/2023 PRESENT   Final   Performed at [facility], [address], [place], [place] [address]  Admission on 10/07/2023, Discharged on 10/08/2023  Component Date Value Ref Range Status   Sodium 10/07/2023 136  135 - 145 mmol/L Final   POSSIBLE INACCURACY OF CMP RESULTS, NOTIFIED B. ORE, RN 10/07/23 2330 2330 BY K. DAVIS   Potassium 10/07/2023 3.8  3.5 - 5.1 mmol/L Final   Chloride 10/07/2023 101  98 - 111 mmol/L Final   CO2 10/07/2023 21 (L)  22 - 32 mmol/L Final   Glucose, Bld 10/07/2023 123 (H)  70 - 99 mg/dL Final   Glucose reference range applies only to samples taken after fasting for at least 8 hours.   BUN 10/07/2023 9  6 - 20 mg/dL Final   Creatinine, Ser 10/07/2023 0.60  0.44 - 1.00 mg/dL Final   Calcium  10/07/2023  9.1  8.9 - 10.3 mg/dL Final   Total Protein 90/82/7974 8.6 (H)  6.5 - 8.1 g/dL Final   Albumin  10/07/2023 3.3 (L)  3.5 - 5.0 g/dL Final   AST 90/82/7974 65 (H)  15 - 41 U/L Final   ALT 10/07/2023 18  0 - 44 U/L Final   Alkaline Phosphatase 10/07/2023 484 (H)  38 - 126 U/L Final   Total Bilirubin 10/07/2023 1.4 (H)  0.0 - 1.2 mg/dL Final   GFR, Estimated 10/07/2023 >60  >60 mL/min Final   Comment: (NOTE) Calculated using the CKD-EPI Creatinine Equation (2021)    Anion gap 10/07/2023 14  5 - 15 Final   Performed at [facility], [address], [place], [place] [address]   Lipase 10/07/2023 83 (H)  11 - 51 U/L Final   Comment: POSSIBLE INACCURACY OF LIPASE RESULTS, NOTIFIED B. ORE, RN 10/07/23 2330 BY K. DAVIS Performed at [facility], [address], [place], [place] [address]   No results displayed because visit has over 200 results.      Blood Alcohol  level:  Lab Results  Component Value Date   [facility] <15 09/26/2023   ETH <5 05/22/2015    Metabolic Disorder Labs: Lab Results  Component Value Date   HGBA1C 4.0 (L) 10/12/2023   MPG 68.1 10/12/2023   MPG 114.02 10/30/2019   No results found for: PROLACTIN Lab Results  Component Value Date   CHOL 203 (H) 10/12/2023   TRIG 259 (H) 10/12/2023   HDL 52 10/12/2023   CHOLHDL 3.9 10/12/2023   VLDL 52 (H) 10/12/2023   LDLCALC 99 10/12/2023   LDLCALC 123 (H) 12/29/2011    Therapeutic Lab Levels: No results found for: LITHIUM No results found for: VALPROATE No results found for: CBMZ  Physical Findings   AIMS    Flowsheet Row [facility]) [facility]-Ad[facility] (Discharged) [facility]  AIMS Total Score 0 0   AUDIT    Flowsheet Row [facility]) [facility]osp-Admission (Discharged) from  [facility] (Discharged) [facility]  Alcohol  Use Disorder Identification Test Final Score (AUDIT) 0 2 0   GAD-7    Flowsheet Row Office Visit from 12/29/2017 in [facility] Dept Of [place]. [facility] Office Visit from 12/01/2017 in [facility] - [facility] Dept Of [place]. [facility]  Total GAD-7 Score 15 15   PHQ2-9    Flowsheet Row ED from 10/10/2023 in [facility] Most recent reading at 10/11/2023  1:12 PM ED from 10/10/2023 in [facility] Most recent reading at 10/10/2023  5:18 PM Office Visit from 04/06/2018 in [facility] - A Dept Of [place]. [facility] Most recent reading at 04/06/2018 11:23 AM Office Visit from 03/23/2018 in [facility] - A Dept Of [place]. [facility] Most recent reading at 03/23/2018 11:49 AM Office Visit from 02/02/2018 in [facility] - A Dept Of [place]. [facility] Most recent reading at 02/02/2018 11:55 AM  PHQ-2 Total Score 6 4 3 3 4   PHQ-9 Total Score 27 16 14 14 16    Flowsheet Row ED from 10/10/2023 in [facility] Most recent reading at 10/11/2023  3:49 AM ED from 10/10/2023 in [facility] Most recent reading at 10/10/2023  6:27 PM ED from 10/09/2023 in [facility] Most recent reading at 10/09/2023  5:13 PM  C-SSRS RISK CATEGORY Moderate Risk High Risk No Risk     Musculoskeletal  Strength & Muscle Tone: within normal limits Gait & Station: normal Patient leans: N/A  Psychiatric Specialty Exam  Presentation  General Appearance:  Disheveled  Eye Contact: Good  Speech: Normal Rate; Clear and Coherent  Speech Volume: Normal  Handedness: Right   Mood and  Affect  Mood: Anxious; Depressed  Affect: Congruent   Thought Process  Thought Processes: Goal Directed; Coherent  Descriptions of Associations:Intact  Orientation:Full (Time, Place and Person)  Thought Content:Logical  Diagnosis of Schizophrenia or Schizoaffective disorder in past: No    Hallucinations:No data recorded Ideas of Reference:None  Suicidal Thoughts:No data recorded Homicidal Thoughts:No data recorded  Sensorium  Memory: Immediate Good; Recent Good; Remote Good  Judgment: Poor  Insight: Poor   Executive Functions  Concentration: Good  Attention Span: Good  Recall: Good  Fund of Knowledge: Good  Language: Good   Psychomotor Activity  Psychomotor Activity: Normal   Assets  Assets: Talents/Skills; Intimacy   Sleep  Sleep:No data recorded Estimated Sleeping Duration (Last 24 Hours): 11.00-13.00 hours  No data recorded  Physical Exam  Physical Exam Constitutional:      General: She is not in acute distress.    Appearance: Normal appearance. She is ill-appearing (chronically ill appearing-appears older than stated age).  HENT:     Head: Normocephalic and atraumatic.     Nose: Nose normal.  Eyes:     Extraocular Movements: Extraocular movements intact.     Conjunctiva/sclera: Conjunctivae normal.     Pupils: Pupils are equal, round, and reactive to light.  Cardiovascular:     Rate and Rhythm: Normal rate and regular rhythm.  Pulmonary:     Effort: Pulmonary effort is normal.     Breath sounds: Normal breath  sounds.  Musculoskeletal:     Cervical back: Normal range of motion and neck supple.  Skin:    Findings: Bruising and erythema present.     Comments: Ecchymosis bilateral arms.  Neurological:     General: No focal deficit present.     Mental Status: She is alert and oriented to person, place, and time.       Review of Systems  Psychiatric/Behavioral:  Positive for depression and substance abuse. Negative for  suicidal ideas. The patient is nervous/anxious.        Mood lability present   Blood pressure 117/75, pulse (!) 106, temperature 97.8 F (36.6 C), temperature source Oral, resp. rate 18, last menstrual period 08/10/2011, SpO2 100%. There is no height or weight on file to calculate BMI.  Treatment Plan Summary: Daily contact with patient to assess and evaluate symptoms and progress in treatment, Medication management, and Plan :  Opioid use with withdrawal (HCC), continue COWS and Clonidine  taper. Monitor BP closely, COWS remains between 4-5   Methamphetamine dependence (HCC), start Remeron , pt has poor appetite and recent hx of protein malnutrition, albumin  decreased and increased protein-Remeron  15 mg , goal of treatment to improve appetite, mood, and improve sleep.     Ankylosing spondylitis of multiple sites in spine (HCC)   Severe episode of recurrent major depressive disorder, without psychotic features (HCC) Substance induced mood disorder (HCC) -Increased Haldol  0.5 mg TID and discontinued scheduled PRN Haldol  (keeping agitation protocol) NEED ECG-encourage patient to complete    Pneumonia of both lungs due to infectious organism, unspecified part of lung, continue cefadroxil  500 mg-BID CBC Reassuring, WBC  8.1   Cellulitis of lower extremity, unspecified laterality, continue cefadroxil  500 mg was not available and pharmacy changed antibiotic therapy to cefdinir  300 mg twice daily for 7 days, No obvious acute infection  Plan to repeat CBC within 48 hours   Vitamin D  deficiency , replaced with D3 2,000 mg daily    Encourage patient to engage in group and milieu activities. SW to assist with outpatient follow-up.    Suzen Lesches, NP 10/14/2023 12:37 PM

## 2023-10-13 NOTE — Discharge Instructions (Signed)
..   Substance Abuse Resources     SUBSTANCE USE TREATMENT for Medicaid and State Funded/IPRS  [facility] [address] [place], [place], [address] [phone] phone NOTE: [facility] is no longer offering IOP services.  Serves those who are low-income or have no insurance.  [facility] [address] [place], [place], [address] [phone] phone [phone] fax NOTE: Does have Substance Abuse-Intensive Outpatient Program [facility]) as well as transitional housing if eligible.  [facility] [address] [place], [place], [address] [phone] phone [phone] fax  [facility] [phone] [place], [place], [address] [phone] phone [phone] fax    HALFWAY HOUSES:  [facility] [phone]  [email or web address]     12 STEP PROGRAMS:  Alcoholics Anonymous of [place] SoftwareChalet.be  Narcotics Anonymous of [place] [employer]ct.dk  Al-Anon of [place], [place] www.[place]alanon.org/find-meetings.html  Nar-Anon https://nar-anon.org/find-a-meetin      List of Residential placements:   [facility] in [place]: [phone]  [facility]: [phone]  [place], [place] [phone]: Female and female facility; 30-day program: (uninsured and Medicaid such as Laurena Bering, [place], [place], partners)  [facility]: [phone]; men and women's facility; 28 days; Can have Medicaid tailored plan Tour manager or Partners)  [facility]: [phone] Karoline Caldwell or Larita Fife; 28 day program; must be fully detox; tailored Medicaid or no insurance  [address] in [place], [place]; [phone]; 28 day all males program; no insurance accepted  [facility] in [place]: Gabriel Rung [phone] (no insurance or Medicaid only); 90 days; outpatient services but provide housing in apartments  [place]  [facility] Admission: [phone]: Patient must complete phone screening for placement: [place], [place]; 6 month program; uninsured, Medicaid, and [employer].   [facility]: no insurance required; [phone]  [facility]: [phone]; Intake: Molly Maduro; Must fill out application online; Alecia Lemming Delay [phone] x [phone] [facility] in [place], [place]: [phone]; Admissions Coordinators Mr. Maurine Minister or Barron Alvine; 90 day program.  [facility]: [place], [place] [phone]; Co-Ed 9 month to a year program; Online application; Men entry fee is $500 (6-71months);  [employer]: [address] [place], [place] [phone]; no fee or insurance required; minimum of 2 years; Highly structured; work based; Intake Coordinator is Thayer Ohm [phone]  [facility] in [place], [place]: [phone]; Fax number is [phone]; website: www.[facility].org; Requires 3-6 page autobiography; 2 year program (18 months and then 32month transitional housing); Admission fee is $300; no insurance needed; work Automotive engineer in [place], [place]: [employer] Desk Staff: Danise Edge [phone]: They have a Men's Regenerations Program 6-15months. Free program; There is an initial $300 fee however, they are willing to work with patients regarding that. Application is online.  [facility]: Admissions [phone] Doran Heater ext [phone]; Any 7-90 day program is out of pocket; 12 month program is free of charge; there is a $275 entry fee; Patient is responsible for own transportation

## 2023-10-13 NOTE — ED Notes (Signed)
Patient asleep at this time. NAD.

## 2023-10-13 NOTE — ED Notes (Signed)
 CIWA assessment not be able to be done as pt is asleep at the moment.

## 2023-10-13 NOTE — Group Note (Signed)
 Group Topic: Recovery Basics  Group Date: 10/13/2023 Start Time: 1205 End Time: 1230 Facilitators: Stanly Stabile, RN  Department: [facility]  Number of Participants: 7  Group Focus: chemical dependency education, chemical dependency issues, and coping skills Treatment Modality:  Behavior Modification Therapy Interventions utilized were clarification, group exercise, mental fitness, patient education, and problem solving Purpose: enhance coping skills, explore maladaptive thinking, express feelings, express irrational fears, improve communication skills, increase insight, and relapse prevention strategies  Name: [name] [name] Date of Birth: [birth year]  MR: [id number]    Level of Participation: minimal Quality of Participation: attention seeking and attentive Interactions with others: gave feedback Mood/Affect: anxious Triggers (if applicable):   Cognition: confused and no insight Progress: Minimal Response:   Plan: follow-up needed  Patients Problems:  Patient Active Problem List   Diagnosis Date Noted   Adjustment disorder with mixed disturbance of emotions and conduct 10/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   Bipolar I disorder (HCC) 10/10/2023   Infectious process 10/03/2023   Malingering 10/03/2023   CAP (community acquired pneumonia) 09/30/2023   Acute respiratory failure with hypoxia (HCC) 09/29/2023   Elevated LFTs 09/26/2023   Cellulitis of both lower extremities 09/26/2023   Normocytic anemia 09/26/2023   Protein-calorie malnutrition, severe 09/26/2023   Necrotizing fasciitis due to Streptococcus pyogenes (HCC)    Wound infection 10/21/2019   Cellulitis 10/21/2019   Substance abuse (HCC) 10/21/2019   Anxiety 10/21/2019   Fever    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

## 2023-10-13 NOTE — ED Notes (Signed)
 Patient continues to be sullen and with many requests and needs which change as needs are being met.  Patient has limited insight and poor judgement.  Emotional coping skills are minimal.  Patient was panicking and crying when she had diarrhea demanding that nurse come to her room.  Patient given tylenol  and imodium  for symptoms.  She has been informed that she will no longer be given milk due to her lactose intolerance and an order has been entered by provider to that effect.  Patient can receive gatorade with meals. Patient asked for discharge however was told that she would not be discharged today and that the plan will be for her to leave tomorrow.  Patient accepted this and is accepting of the plan.  Will monitor.

## 2023-10-13 NOTE — ED Notes (Signed)
 Patient is in the bedroom calm and sleeping. NAD. Will continue to monitor for safety.

## 2023-10-13 NOTE — Progress Notes (Signed)
 Meal given

## 2023-10-13 NOTE — Progress Notes (Signed)
meal provided 

## 2023-10-13 NOTE — Group Note (Signed)
 Group Topic: Healthy Self Image and Positive Change  Group Date: 10/13/2023 Start Time: 1630 End Time: 1700 Facilitators: Elnor Keven SAILOR  Department: [facility]  Number of Participants: 8  Group Focus: affirmation Treatment Modality:  Psychoeducation Interventions utilized were mental fitness. 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   Bipolar I disorder (HCC) 10/10/2023   Infectious process 10/03/2023   Malingering 10/03/2023   CAP (community acquired pneumonia) 09/30/2023   Acute respiratory failure with hypoxia (HCC) 09/29/2023   Elevated LFTs 09/26/2023   Cellulitis of both lower extremities 09/26/2023   Normocytic anemia 09/26/2023   Protein-calorie malnutrition, severe 09/26/2023   Necrotizing fasciitis due to Streptococcus pyogenes (HCC)    Wound infection 10/21/2019   Cellulitis 10/21/2019   Substance abuse (HCC) 10/21/2019   Anxiety 10/21/2019   Fever    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

## 2023-10-13 NOTE — ED Notes (Signed)
 Patient is resting in bed.  She is lethargic with active track marks which appear bruised.  Patient insists on drinking milk even though she is lactose intolerant.  Patient informed that milk is not recommended.  She drank it anyway.  Patient is dysphoric with sad affect.  Will monitor.

## 2023-10-14 DIAGNOSIS — F411 Generalized anxiety disorder: Secondary | ICD-10-CM | POA: Diagnosis not present

## 2023-10-14 DIAGNOSIS — R45851 Suicidal ideations: Secondary | ICD-10-CM | POA: Diagnosis not present

## 2023-10-14 DIAGNOSIS — F319 Bipolar disorder, unspecified: Secondary | ICD-10-CM | POA: Diagnosis not present

## 2023-10-14 DIAGNOSIS — F19139 Other psychoactive substance abuse with withdrawal, unspecified: Secondary | ICD-10-CM | POA: Diagnosis not present

## 2023-10-14 MED ORDER — OLANZAPINE 5 MG PO TBDP
5.0000 mg | ORAL_TABLET | Freq: Two times a day (BID) | ORAL | Status: DC
  Administered 2023-10-14 – 2023-10-15 (×3): 5 mg via ORAL
  Filled 2023-10-14 (×3): qty 1

## 2023-10-14 MED ORDER — MIRTAZAPINE 15 MG PO TBDP
15.0000 mg | ORAL_TABLET | Freq: Every day | ORAL | Status: DC
  Administered 2023-10-14: 15 mg via ORAL
  Filled 2023-10-14: qty 1

## 2023-10-14 NOTE — ED Notes (Signed)
 Patient is in the bedroom calm and compose, NAD. Will continue to monitor for safety.

## 2023-10-14 NOTE — ED Notes (Signed)
 Patient is in the Dayroom calm and composed watching TV with other patients. NAD. Respirations even and unlabored. Environment is secured per policy. Asking about Gatorade a nurse asked her to drink because she is dehydrated. Will continue to monitor for safety.

## 2023-10-14 NOTE — ED Notes (Signed)
 Pt requested the air to be turn down because she is sweating.

## 2023-10-14 NOTE — ED Notes (Signed)
 Pt sleeping in no acute distress. RR even and unlabored. Environment secured. Will continue to monitor for safety.

## 2023-10-14 NOTE — ED Notes (Signed)
 Patient is in the Dayroom calm and composed watching TV with other patients. NAD. Respirations even and unlabored. Environment is secured per policy. Will continue to monitor for safety.

## 2023-10-14 NOTE — Group Note (Signed)
 Group Topic: Communication  Group Date: 10/14/2023 Start Time: 2000 End Time: 2100 Facilitators: Joan Plowman B  Department: [facility]  Number of Participants: 4  Group Focus: communication and goals/reality orientation Treatment Modality:  Individual Therapy and Interpersonal Therapy Interventions utilized were leisure development and support Purpose: express feelings and improve communication skills  Name: [name] [name] Date of Birth: 08/04/1980  MR: [id number]    Level of Participation: active Quality of Participation: attentive Interactions with others: gave feedback Mood/Affect: appropriate Triggers (if applicable): NA Cognition: coherent/clear Progress: Gaining insight Response: NA Plan: patient will be encouraged to keep going to groups  Patients Problems:  Patient Active Problem List   Diagnosis Date Noted   Adjustment disorder with mixed disturbance of emotions and conduct 10/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   Bipolar I disorder (HCC) 10/10/2023   Infectious process 10/03/2023   Malingering 10/03/2023   CAP (community acquired pneumonia) 09/30/2023   Acute respiratory failure with hypoxia (HCC) 09/29/2023   Elevated LFTs 09/26/2023   Cellulitis of both lower extremities 09/26/2023   Normocytic anemia 09/26/2023   Protein-calorie malnutrition, severe 09/26/2023   Necrotizing fasciitis due to Streptococcus pyogenes (HCC)    Wound infection 10/21/2019   Cellulitis 10/21/2019   Substance abuse (HCC) 10/21/2019   Anxiety 10/21/2019   Fever    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

## 2023-10-14 NOTE — ED Notes (Signed)
 Patient A&Ox4. Denies intent to harm self/others when asked. Denies A/VH. Patient denies any physical complaints when asked. No acute distress noted. Support and encouragement provided. Routine safety checks conducted according to facility protocol. Encouraged patient to notify staff if thoughts of harm toward self or others arise. Patient verbalize understanding and agreement. Pt request to discharge today. Provider made aware. Will continue to monitor for safety.

## 2023-10-14 NOTE — Group Note (Signed)
 Group Topic: Relapse and Recovery  Group Date: 10/14/2023 Start Time: 1230 End Time: 1300 Facilitators: Stanly Stabile, RN  Department: [facility]  Number of Participants: 6  Group Focus: chemical dependency education and chemical dependency issues Treatment Modality:  Behavior Modification Therapy Interventions utilized were clarification, exploration, and patient education Purpose: enhance coping skills, explore maladaptive thinking, express irrational fears, increase insight, regain self-worth, and relapse prevention strategies  Name: [name] [name] Date of Birth: 1980/03/21  MR: [id number]    Level of Participation: did not attend Quality of Participation:  Interactions with others:  Mood/Affect:  Triggers (if applicable):  Cognition:  Progress:  Response:  Plan:   Patients Problems:  Patient Active Problem List   Diagnosis Date Noted   Adjustment disorder with mixed disturbance of emotions and conduct 10/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   Bipolar I disorder (HCC) 10/10/2023   Infectious process 10/03/2023   Malingering 10/03/2023   CAP (community acquired pneumonia) 09/30/2023   Acute respiratory failure with hypoxia (HCC) 09/29/2023   Elevated LFTs 09/26/2023   Cellulitis of both lower extremities 09/26/2023   Normocytic anemia 09/26/2023   Protein-calorie malnutrition, severe 09/26/2023   Necrotizing fasciitis due to Streptococcus pyogenes (HCC)    Wound infection 10/21/2019   Cellulitis 10/21/2019   Substance abuse (HCC) 10/21/2019   Anxiety 10/21/2019   Fever    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

## 2023-10-14 NOTE — ED Notes (Signed)
 Pt requested for tylenol  for leg pain of 3/10.

## 2023-10-14 NOTE — ED Notes (Signed)
 [facility] gave out snacks and Pt kept asking to return one for something else. She did it 4 times I let her know that we can't keep touching everyone's food. She asked for a milk. We had no milk. She asked for juice I told her we wasn't allowed to give juice at night that it is for lunch and dinner she then got upset and demanded a Gatorade I told her I would have to ask the RN for permission first.

## 2023-10-14 NOTE — ED Provider Notes (Signed)
 Behavioral Health Progress Note  Date and Time: 10/14/2023 12:39 PM Name: [name] [name] MRN:  [id number]  Subjective:  Can I be discharged today  Diagnosis:  Final diagnoses:  Opioid use with withdrawal (HCC)  Methamphetamine dependence (HCC)  Ankylosing spondylitis of multiple sites in spine (HCC)  Severe episode of recurrent major depressive disorder, without psychotic features (HCC)  Substance induced mood disorder (HCC)  Pneumonia of both lungs due to infectious organism, unspecified part of lung  Cellulitis of lower extremity, unspecified laterality  Vitamin D  deficiency   Total Time spent with patient: 30 minutes [name] [name] is a 43 year old female, with complicated medical history secondary to chronic opioid use. Patient hospitalized from 09/25/2023-9/162025 after presenting to ED due to abdominal pain, fever, and subsequently developing SOB which resulted in hypoxia and finding of pneumonia.   Patient seen today during rounds.  Per nursing report overnight patient was in and out of her room and her knit underwear and had to be redirected multiple times to remain in her room given the the unit is mixed female and female.  During rounds when this writer saw the patient she appeared to be calm, although asking if she could be discharged today.  Discussed with patient that she is continuing to score between 4 and 6 on her COWS score indicating she is still experiencing some symptoms of withdrawal which would increase the likelihood of her possibly relapse and therefore encouraged patient to remain here at the facility to continue to be monitored.  Also discussed changing antipsychotic therapy as she continues to exhibit symptoms of depression.  Patient was amenable to this change.  Patient was amenable to this change in treatment.  Patient reports that her back is hurting due to spondylitis she was reminded that she has as needed medication on file which is methocarbamol .  After explaining  goal of patient stabilized and without significant withdrawal symptoms and cravings in addition to ensuring the patient's overall physical health was stable as indication for keeping patient at least an additional day 1-2.  After explaining this to patient patient was agreeable to voluntary commitment and remaining here at facility based crisis.   Patient encouraged to interact within the milieu and attend groups today.  Patient continues to endorse that she hears the voice of her deceased daughter however this is chronic and has been going on for years.  She denies any suicidal ideations or homicidal ideations today.  She continues to endorse feeling depressed.  Patient will follow-up in the [facility] outpatient program along with plans to reach out to [facility] as she wishes to treat opiate addiction with Suboxone .  Additional Social History:    Sleep: Poor  Appetite:  Fair  Current Medications:  Current Facility-Administered Medications  Medication Dose Route Frequency Provider Last Rate Last Admin   acetaminophen  (TYLENOL ) tablet 650 mg  650 mg Oral Q6H PRN Randall Starlyn HERO, NP   650 mg at 10/14/23 0944   alum & mag hydroxide-simeth (MAALOX/MYLANTA) 200-200-20 MG/5ML suspension 30 mL  30 mL Oral Q4H PRN Randall Starlyn HERO, NP   30 mL at 10/12/23 2112   bacitracin  ointment 1 Application  1 Application Topical PRN Arloa Suzen RAMAN, NP       cefdinir  (OMNICEF ) capsule 300 mg  300 mg Oral Q12H Arloa Suzen RAMAN, NP   300 mg at 10/14/23 0933   cholecalciferol  (VITAMIN D3) 25 MCG (1000 UNIT) tablet 2,000 Units  2,000 Units Oral Daily Arloa Suzen RAMAN, NP  2,000 Units at 10/14/23 0900   cloNIDine  (CATAPRES ) tablet 0.1 mg  0.1 mg Oral BH-qamhs Gottfried, Rhoda J, MD   0.1 mg at 10/14/23 0900   Followed by   NOREEN ON 10/15/2023] cloNIDine  (CATAPRES ) tablet 0.1 mg  0.1 mg Oral QAC breakfast Gottfried, Rhoda J, MD       dicyclomine  (BENTYL ) tablet 20 mg  20 mg Oral Q6H PRN Randall Starlyn HERO, NP   20 mg at 10/10/23 2133   haloperidol  (HALDOL ) tablet 5 mg  5 mg Oral TID PRN Randall Starlyn HERO, NP   5 mg at 10/10/23 2229   And   diphenhydrAMINE  (BENADRYL ) capsule 50 mg  50 mg Oral TID PRN Randall Starlyn HERO, NP   50 mg at 10/10/23 2229   haloperidol  lactate (HALDOL ) injection 10 mg  10 mg Intramuscular TID PRN Randall Starlyn HERO, NP       And   diphenhydrAMINE  (BENADRYL ) injection 50 mg  50 mg Intramuscular TID PRN Randall Starlyn HERO, NP       And   LORazepam  (ATIVAN ) injection 2 mg  2 mg Intramuscular TID PRN Randall Starlyn HERO, NP       haloperidol  lactate (HALDOL ) injection 5 mg  5 mg Intramuscular TID PRN Randall Starlyn HERO, NP       And   diphenhydrAMINE  (BENADRYL ) injection 50 mg  50 mg Intramuscular TID PRN Randall Starlyn HERO, NP       And   LORazepam  (ATIVAN ) injection 2 mg  2 mg Intramuscular TID PRN Randall, Veronique M, NP       gabapentin  (NEURONTIN ) capsule 100 mg  100 mg Oral TID Gottfried, Rhoda J, MD   100 mg at 10/14/23 0931   hydrOXYzine  (ATARAX ) tablet 25 mg  25 mg Oral Q6H PRN Randall Starlyn HERO, NP   25 mg at 10/12/23 2110   loperamide  (IMODIUM ) capsule 2-4 mg  2-4 mg Oral PRN Randall Starlyn HERO, NP   4 mg at 10/13/23 1059   magnesium  hydroxide (MILK OF MAGNESIA) suspension 30 mL  30 mL Oral Daily PRN Randall Starlyn HERO, NP       methocarbamol  (ROBAXIN ) tablet 500 mg  500 mg Oral Q8H PRN Randall, Veronique M, NP   500 mg at 10/10/23 2133   mirtazapine  (REMERON  SOL-TAB) disintegrating tablet 15 mg  15 mg Oral QHS Arloa Suzen RAMAN, NP       multivitamin with minerals tablet 1 tablet  1 tablet Oral Daily Gottfried, Rhoda J, MD   1 tablet at 10/14/23 9068   OLANZapine  zydis (ZYPREXA ) disintegrating tablet 5 mg  5 mg Oral BID Arloa Suzen RAMAN, NP   5 mg at 10/14/23 0941   promethazine  (PHENERGAN ) tablet 25 mg  25 mg Oral Q6H PRN Gottfried, Rhoda J, MD   25 mg at 10/12/23 1228   ramelteon  (ROZEREM ) tablet 8 mg  8 mg Oral  QHS Gottfried, Rhoda J, MD   8 mg at 10/13/23 2125   thiamine  (VITAMIN B1) tablet 100 mg  100 mg Oral Daily Gottfried, Rhoda J, MD   100 mg at 10/14/23 0931   traZODone  (DESYREL ) tablet 50 mg  50 mg Oral QHS PRN Byungura, Veronique M, NP   50 mg at 10/13/23 2125   No current outpatient medications on file.    Labs  Lab Results:  Admission on 10/10/2023  Component Date Value Ref Range Status   Hgb A1c MFr Bld 10/12/2023 4.0 (L)  4.8 - 5.6 % Final  Comment: (NOTE) Diagnosis of Diabetes The following HbA1c ranges recommended by the American Diabetes Association (ADA) may be used as an aid in the diagnosis of diabetes mellitus.  Hemoglobin             Suggested A1C NGSP%              Diagnosis  <5.7                   Non Diabetic  5.7-6.4                Pre-Diabetic  >6.4                   Diabetic  <7.0                   Glycemic control for                       adults with diabetes.     Mean Plasma Glucose 10/12/2023 68.1  mg/dL Final   Performed at [facility], [address], [place], [place] [address]   Cholesterol 10/12/2023 203 (H)  0 - 200 mg/dL Final   Triglycerides 90/77/7974 259 (H)  <150 mg/dL Final   HDL 90/77/7974 52  >40 mg/dL Final   Total CHOL/HDL Ratio 10/12/2023 3.9  RATIO Final   VLDL 10/12/2023 52 (H)  0 - 40 mg/dL Final   LDL Cholesterol 10/12/2023 99  0 - 99 mg/dL Final   Comment:        Total Cholesterol/HDL:CHD Risk Coronary Heart Disease Risk Table                     Men   Women  1/2 Average Risk   3.4   3.3  Average Risk       5.0   4.4  2 X Average Risk   9.6   7.1  3 X Average Risk  23.4   11.0        Use the calculated Patient Ratio above and the CHD Risk Table to determine the patient's CHD Risk.        ATP III CLASSIFICATION (LDL):  <100     mg/dL   Optimal  899-870  mg/dL   Near or Above                    Optimal  130-159  mg/dL   Borderline  839-810  mg/dL   High  >809     mg/dL   Very High Performed at [facility], [address] [place], [place] [address]    WBC 10/12/2023 8.1  4.0 - 10.5 K/uL Final   RBC 10/12/2023 4.18  3.87 - 5.11 MIL/uL Final   Hemoglobin 10/12/2023 11.2 (L)  12.0 - 15.0 g/dL Final   HCT 90/77/7974 34.5 (L)  36.0 - 46.0 % Final   MCV 10/12/2023 82.5  80.0 - 100.0 fL Final   MCH 10/12/2023 26.8  26.0 - 34.0 pg Final   MCHC 10/12/2023 32.5  30.0 - 36.0 g/dL Final   RDW 90/77/7974 20.2 (H)  11.5 - 15.5 % Final   Platelets 10/12/2023 535 (H)  150 - 400 K/uL Final   nRBC 10/12/2023 0.0  0.0 - 0.2 % Final   Neutrophils Relative % 10/12/2023 64  % Final   Neutro Abs 10/12/2023 5.2  1.7 - 7.7 K/uL Final   Lymphocytes Relative  10/12/2023 26  % Final   Lymphs Abs 10/12/2023 2.1  0.7 - 4.0 K/uL Final   Monocytes Relative 10/12/2023 8  % Final   Monocytes Absolute 10/12/2023 0.6  0.1 - 1.0 K/uL Final   Eosinophils Relative 10/12/2023 1  % Final   Eosinophils Absolute 10/12/2023 0.1  0.0 - 0.5 K/uL Final   Basophils Relative 10/12/2023 0  % Final   Basophils Absolute 10/12/2023 0.0  0.0 - 0.1 K/uL Final   Immature Granulocytes 10/12/2023 1  % Final   Abs Immature Granulocytes 10/12/2023 0.05  0.00 - 0.07 K/uL Final   Performed at [facility], [address] [place], [place] [address]   Sodium 10/12/2023 134 (L)  135 - 145 mmol/L Final   Potassium 10/12/2023 3.9  3.5 - 5.1 mmol/L Final   Chloride 10/12/2023 105  98 - 111 mmol/L Final   CO2 10/12/2023 19 (L)  22 - 32 mmol/L Final   Glucose, Bld 10/12/2023 167 (H)  70 - 99 mg/dL Final   Glucose reference range applies only to samples taken after fasting for at least 8 hours.   BUN 10/12/2023 13  6 - 20 mg/dL Final   Creatinine, Ser 10/12/2023 0.63  0.44 - 1.00 mg/dL Final   Calcium  10/12/2023 8.9  8.9 - 10.3 mg/dL Final   Total Protein 90/77/7974 8.2 (H)  6.5 - 8.1 g/dL Final   Albumin  10/12/2023 3.3 (L)  3.5 - 5.0 g/dL Final   AST 90/77/7974 73 (H)  15 - 41 U/L Final   ALT 10/12/2023 36  0 - 44 U/L Final   Alkaline  Phosphatase 10/12/2023 284 (H)  38 - 126 U/L Final   Total Bilirubin 10/12/2023 1.0  0.0 - 1.2 mg/dL Final   GFR, Estimated 10/12/2023 >60  >60 mL/min Final   Comment: (NOTE) Calculated using the CKD-EPI Creatinine Equation (2021)    Anion gap 10/12/2023 10  5 - 15 Final   Performed at [facility], [address], [place], [place] [address]   TSH 10/12/2023 1.102  0.350 - 4.500 uIU/mL Final   Comment: Performed by a 3rd Generation assay with a functional sensitivity of <=0.01 uIU/mL. Performed at [facility], [address], [place], [place] [address]    Vit D, 25-Hydroxy 10/12/2023 20.01 (L)  30 - 100 ng/mL Final   Comment: (NOTE) Vitamin D  deficiency has been defined by the Institute of Medicine  and an Endocrine Society practice guideline as a level of serum 25-OH  vitamin D  less than 20 ng/mL (1,2). The Endocrine Society went on to  further define vitamin D  insufficiency as a level between 21 and 29  ng/mL (2).  1. IOM (Institute of Medicine). 2010. Dietary reference intakes for  calcium  and D. [place]: The [employer]. 2. Holick MF, Binkley [place], Bischoff-Ferrari HA, et al. Evaluation,  treatment, and prevention of vitamin D  deficiency: an Endocrine  Society clinical practice guideline, JCEM. 2011 Jul; 96(7): 1911-30.  Performed at [facility], [address] [place], [place] [address]    Vitamin B-12 10/12/2023 380  180 - 914 pg/mL Final   Comment: (NOTE) This assay is not validated for testing neonatal or myeloproliferative syndrome specimens for Vitamin B12 levels. Performed at [facility], [address], [place], [place] [address]   Admission on 10/10/2023, Discharged on 10/10/2023  Component Date Value Ref Range Status   POC Amphetamine UR 10/10/2023 None Detected  NONE DETECTED (Cut  Off Level 1000 ng/mL) Corrected   POC Secobarbital (BAR) 10/10/2023 None Detected  NONE DETECTED (Cut Off Level 300 ng/mL) Corrected   POC  Buprenorphine  (BUP) 10/10/2023 None Detected  NONE DETECTED (Cut Off Level 10 ng/mL) Corrected   POC Oxazepam (BZO) 10/10/2023 Positive (A)  NONE DETECTED (Cut Off Level 300 ng/mL) Corrected   POC Cocaine UR 10/10/2023 None Detected  NONE DETECTED (Cut Off Level 300 ng/mL) Corrected   POC Methamphetamine UR 10/10/2023 None Detected  NONE DETECTED (Cut Off Level 1000 ng/mL) Corrected   POC Morphine  10/10/2023 None Detected  NONE DETECTED (Cut Off Level 300 ng/mL) Corrected   POC Methadone UR 10/10/2023 None Detected  NONE DETECTED (Cut Off Level 300 ng/mL) Corrected   POC Oxycodone  UR 10/10/2023 Positive (A)  NONE DETECTED (Cut Off Level 100 ng/mL) Corrected   POC Marijuana UR 10/10/2023 None Detected  NONE DETECTED (Cut Off Level 50 ng/mL) Corrected  Admission on 10/08/2023, Discharged on 10/09/2023  Component Date Value Ref Range Status   Sodium 10/09/2023 133 (L)  135 - 145 mmol/L Final   Potassium 10/09/2023 3.6  3.5 - 5.1 mmol/L Final   Chloride 10/09/2023 104  98 - 111 mmol/L Final   CO2 10/09/2023 19 (L)  22 - 32 mmol/L Final   Glucose, Bld 10/09/2023 149 (H)  70 - 99 mg/dL Final   Glucose reference range applies only to samples taken after fasting for at least 8 hours.   BUN 10/09/2023 8  6 - 20 mg/dL Final   Creatinine, Ser 10/09/2023 0.58  0.44 - 1.00 mg/dL Final   Calcium  10/09/2023 8.6 (L)  8.9 - 10.3 mg/dL Final   Total Protein 90/80/7974 7.9  6.5 - 8.1 g/dL Final   Albumin  10/09/2023 2.8 (L)  3.5 - 5.0 g/dL Final   AST 90/80/7974 49 (H)  15 - 41 U/L Final   ALT 10/09/2023 22  0 - 44 U/L Final   Alkaline Phosphatase 10/09/2023 315 (H)  38 - 126 U/L Final   Total Bilirubin 10/09/2023 1.3 (H)  0.0 - 1.2 mg/dL Final   GFR, Estimated 10/09/2023 >60  >60 mL/min Final   Comment: (NOTE) Calculated using the CKD-EPI Creatinine Equation (2021)    Anion gap 10/09/2023 10  5 - 15 Final   Performed at [facility], [address] [place], [place] [address]   WBC 10/09/2023 8.8   4.0 - 10.5 K/uL Final   RBC 10/09/2023 3.76 (L)  3.87 - 5.11 MIL/uL Final   Hemoglobin 10/09/2023 9.9 (L)  12.0 - 15.0 g/dL Final   HCT 90/80/7974 32.0 (L)  36.0 - 46.0 % Final   MCV 10/09/2023 85.1  80.0 - 100.0 fL Final   MCH 10/09/2023 26.3  26.0 - 34.0 pg Final   MCHC 10/09/2023 30.9  30.0 - 36.0 g/dL Final   RDW 90/80/7974 20.7 (H)  11.5 - 15.5 % Final   Platelets 10/09/2023 467 (H)  150 - 400 K/uL Final   nRBC 10/09/2023 0.0  0.0 - 0.2 % Final   Neutrophils Relative % 10/09/2023 74  % Final   Neutro Abs 10/09/2023 6.4  1.7 - 7.7 K/uL Final   Lymphocytes Relative 10/09/2023 19  % Final   Lymphs Abs 10/09/2023 1.7  0.7 - 4.0 K/uL Final   Monocytes Relative 10/09/2023 6  % Final   Monocytes Absolute 10/09/2023 0.5  0.1 - 1.0 K/uL Final   Eosinophils Relative 10/09/2023 0  % Final   Eosinophils Absolute 10/09/2023 0.0  0.0 - 0.5 K/uL Final   Basophils Relative 10/09/2023 0  % Final   Basophils Absolute 10/09/2023 0.0  0.0 - 0.1 K/uL Final   Immature Granulocytes 10/09/2023 1  % Final   Abs Immature Granulocytes 10/09/2023 0.12 (H)  0.00 - 0.07 K/uL Final   Performed at [facility], [address], [place], [place] [address]   Color, Urine 10/09/2023 YELLOW  YELLOW Final   APPearance 10/09/2023 CLOUDY (A)  CLEAR Final   Specific Gravity, Urine 10/09/2023 1.016  1.005 - 1.030 Final   pH 10/09/2023 7.0  5.0 - 8.0 Final   Glucose, UA 10/09/2023 NEGATIVE  NEGATIVE mg/dL Final   Hgb urine dipstick 10/09/2023 NEGATIVE  NEGATIVE Final   Bilirubin Urine 10/09/2023 NEGATIVE  NEGATIVE Final   Ketones, ur 10/09/2023 5 (A)  NEGATIVE mg/dL Final   Protein, ur 90/80/7974 30 (A)  NEGATIVE mg/dL Final   Nitrite 90/80/7974 NEGATIVE  NEGATIVE Final   Leukocytes,Ua 10/09/2023 NEGATIVE  NEGATIVE Final   RBC / HPF 10/09/2023 0-5  0 - 5 RBC/hpf Final   WBC, UA 10/09/2023 0-5  0 - 5 WBC/hpf Final   Bacteria, UA 10/09/2023 RARE (A)  NONE SEEN Final   Squamous Epithelial / HPF 10/09/2023 0-5  0 -  5 /HPF Final   Mucus 10/09/2023 PRESENT   Final   Performed at [facility], [address] [place], [place] [address]  Admission on 10/07/2023, Discharged on 10/08/2023  Component Date Value Ref Range Status   Sodium 10/07/2023 136  135 - 145 mmol/L Final   POSSIBLE INACCURACY OF CMP RESULTS, NOTIFIED B. ORE, RN 10/07/23 2330 2330 BY K. DAVIS   Potassium 10/07/2023 3.8  3.5 - 5.1 mmol/L Final   Chloride 10/07/2023 101  98 - 111 mmol/L Final   CO2 10/07/2023 21 (L)  22 - 32 mmol/L Final   Glucose, Bld 10/07/2023 123 (H)  70 - 99 mg/dL Final   Glucose reference range applies only to samples taken after fasting for at least 8 hours.   BUN 10/07/2023 9  6 - 20 mg/dL Final   Creatinine, Ser 10/07/2023 0.60  0.44 - 1.00 mg/dL Final   Calcium  10/07/2023 9.1  8.9 - 10.3 mg/dL Final   Total Protein 90/82/7974 8.6 (H)  6.5 - 8.1 g/dL Final   Albumin  10/07/2023 3.3 (L)  3.5 - 5.0 g/dL Final   AST 90/82/7974 65 (H)  15 - 41 U/L Final   ALT 10/07/2023 18  0 - 44 U/L Final   Alkaline Phosphatase 10/07/2023 484 (H)  38 - 126 U/L Final   Total Bilirubin 10/07/2023 1.4 (H)  0.0 - 1.2 mg/dL Final   GFR, Estimated 10/07/2023 >60  >60 mL/min Final   Comment: (NOTE) Calculated using the CKD-EPI Creatinine Equation (2021)    Anion gap 10/07/2023 14  5 - 15 Final   Performed at [facility], [address], [place], [place] [address]   Lipase 10/07/2023 83 (H)  11 - 51 U/L Final   Comment: POSSIBLE INACCURACY OF LIPASE RESULTS, NOTIFIED B. ORE, RN 10/07/23 2330 BY K. DAVIS Performed at [facility], [address], [place], [place] [address]   No results displayed because visit has over 200 results.      Blood Alcohol  level:  Lab Results  Component Value Date   [facility] <15 09/26/2023   ETH <5 05/22/2015    Metabolic Disorder Labs: Lab Results  Component Value Date   HGBA1C  4.0 (L) 10/12/2023   MPG 68.1 10/12/2023   MPG 114.02 10/30/2019   No results  found for: PROLACTIN Lab Results  Component Value Date   CHOL 203 (H) 10/12/2023   TRIG 259 (H) 10/12/2023   HDL 52 10/12/2023   CHOLHDL 3.9 10/12/2023   VLDL 52 (H) 10/12/2023   LDLCALC 99 10/12/2023   LDLCALC 123 (H) 12/29/2011    Therapeutic Lab Levels: No results found for: LITHIUM No results found for: VALPROATE No results found for: CBMZ  Physical Findings   AIMS    Flowsheet Row [facility]) [facility]-Ad[facility] (Discharged) [facility]  AIMS Total Score 0 0   AUDIT    Flowsheet Row [facility]) [facility]-Admission (Discharged) [facility] (Discharged[facility]  Alcohol  Use Disorder Identification Test Final Score (AUDIT) 0 2 0   GAD-7    Flowsheet Row Office Visit from 12/29/2017 in [facility] Of [place]. [facility] Office Visit from 12/01/2017 in [facility] - [facility] Dept Of [place]. [facility]  Total GAD-7 Score 15 15   PHQ2-9    Flowsheet Row ED from 10/10/2023 in [facility] Most recent reading at 10/11/2023  1:12 PM ED from 10/10/2023 in [facility] Most recent reading at 10/10/2023  5:18 PM Office Visit from 04/06/2018 in [facility] - A Dept Of [place]. [facility] Most recent reading at 04/06/2018 11:23 AM Office Visit from 03/23/2018 in [facility] - A Dept Of [place]. [facility] Most recent reading at 03/23/2018 11:49 AM Office Visit from 02/02/2018 in [facility] - A Dept Of . [facility] Most recent reading at 02/02/2018 11:55 AM  PHQ-2 Total  Score 6 4 3 3 4   PHQ-9 Total Score 27 16 14 14 16    Flowsheet Row ED from 10/10/2023 in [facility] Most recent reading at 10/11/2023  3:49 AM ED from 10/10/2023 in [facility] Most recent reading at 10/10/2023  6:27 PM ED from 10/09/2023 in [facility] Emergency Department at [facility] Most recent reading at 10/09/2023  5:13 PM  C-SSRS RISK CATEGORY Moderate Risk High Risk No Risk     Musculoskeletal  Strength & Muscle Tone: within normal limits Gait & Station: normal Patient leans: N/A  Psychiatric Specialty Exam  Presentation  General Appearance:  Disheveled  Eye Contact: Good  Speech: Normal Rate; Clear and Coherent  Speech Volume: Normal  Handedness: Right   Mood and Affect  Mood: Anxious; Depressed  Affect: Congruent   Thought Process  Thought Processes: Goal Directed; Coherent  Descriptions of Associations:Intact  Orientation:Full (Time, Place and Person)  Thought Content:Logical  Diagnosis of Schizophrenia or Schizoaffective disorder in past: No    Hallucinations: Yes, chronic, voice of daughter recorded Ideas of Reference:None  Suicidal Thoughts:No data recorded Homicidal Thoughts:No data recorded  Sensorium  Memory: Immediate Good; Recent Good; Remote Good  Judgment: Poor  Insight: Poor   Executive Functions  Concentration: Good  Attention Span: Good  Recall: Good  Fund of Knowledge: Good  Language: Good   Psychomotor Activity  Psychomotor Activity: Normal   Assets  Assets: Talents/Skills; Intimacy   Sleep  Sleep:No data recorded Estimated Sleeping Duration (Last 24 Hours): 11.00-13.00 hours  No data recorded  Physical Exam  Physical Exam Constitutional:      General: She is not in acute distress.    Appearance: Normal appearance. She is ill-appearing (chronically ill appearing-appears older than stated age).  HENT:     Head: Normocephalic  and atraumatic.     Nose: Nose normal.  Eyes:     Extraocular Movements: Extraocular movements intact.     Conjunctiva/sclera: Conjunctivae normal.     Pupils: Pupils are equal, round, and reactive to light.  Cardiovascular:     Rate and Rhythm: Normal rate and regular rhythm.  Pulmonary:     Effort: Pulmonary effort is normal.     Breath sounds: Normal breath sounds.  Musculoskeletal:     Cervical back: Normal range of motion and neck supple.  Skin:    Findings: Bruising and erythema present.     Comments: Ecchymosis bilateral arms.  Neurological:     General: No focal deficit present.     Mental Status: She is alert and oriented to person, place, and time.       Review of Systems  Psychiatric/Behavioral:  Positive for depression and substance abuse. Negative for suicidal ideas.    Blood pressure 117/75, pulse (!) 106, temperature 97.8 F (36.6 C), temperature source Oral, resp. rate 18, last menstrual period 08/10/2011, SpO2 100%. There is no height or weight on file to calculate BMI.  Treatment Plan Summary: Daily contact with patient to assess and evaluate symptoms and progress in treatment, Medication management, and Plan :  Opioid use with withdrawal (HCC), continue COWS and Clonidine  taper. Monitor BP closely, COWS remains between 4-5.  Patient has not heard back regarding step-by-step program did advise that I would place a referral to [facility] on her behalf for substance treatment with MAT Therapy.  Methamphetamine dependence (HCC), tolerating Remeron  appears to be making efforts to increase food intake. Patient's mood is less labile.  Continue to monitor  Ankylosing spondylitis of multiple sites in spine (HCC), methocarbamol  every 8 hours as needed   Severe episode of recurrent major depressive disorder, without psychotic features (HCC),  PHQ-9 score 27, continue Remeron  medication changed from Haldol  to olanzapine  5 mg twice daily to help with underlying  depression and mood lability   Substance induced mood disorder (HCC) -Increased Haldol  0.5 mg TID and discontinued scheduled PRN Haldol  (keeping agitation protocol) NEED ECG-encourage patient to complete    Pneumonia of both lungs due to infectious organism, unspecified part of lung, continue cefadroxil  500 mg-BID Change treatment to Cefdinir  300 mg twice daily for 7 days   Cellulitis of lower extremity, unspecified laterality, continue cefadroxil  500 mg was not available and pharmacy changed antibiotic therapy to Cefdinir  300 mg twice daily for 7 days, No obvious acute infection  Plan to repeat CBC within 48 hours   Vitamin D  deficiency, replaced with D3 2,000 mg daily    Encourage patient to engage in group and milieu activities. Discussed with patient given that she is still having positive COWS scores and mild mood lability and recent change to medication would like for her to remain here at the facility we can discuss on a daily basis when she will be appropriate for discharge so that she can pursue follow-up in the CD IOP program.  Patient verbalized understanding and agreement with plan. SW to assist with outpatient  follow-up.    Suzen Lesches, NP 10/14/2023 12:39 PM

## 2023-10-14 NOTE — ED Notes (Signed)
 Pt is sleeping, no acute distress noted. Q15 safety checks in place.

## 2023-10-14 NOTE — ED Notes (Signed)
 Pt wanted the air down two other pts on the unit are cold and requested for the heat to be turned up. Due to this they compromised and agreed to 70 earlier before bed. On rounds she asked since the other pts are sleeping if I would turn it all the way back down. I explained to her that we can't  but if she would leave her door open the cold air will come in the room better. She said she didn't want her door opened.

## 2023-10-14 NOTE — Group Note (Signed)
 Group Topic: Balance in Life  Group Date: 10/14/2023 Start Time: 1930 End Time: 2000 Facilitators: Carollynn Genre, NT  Department: [facility]  Number of Participants: 5  Group Focus: check in Treatment Modality:  Individual Therapy Interventions utilized were group exercise Purpose: reinforce self-care  Name: Regine Christian Date of Birth: 1980-02-16  MR: [id number]    Level of Participation: active Quality of Participation:  n a Interactions with others:  N/A Mood/Affect: n A Triggers (if applicable): n A Cognition: concrete Progress: Gaining insight Response:  GOOD Plan: follow-up needed  Patients Problems:  Patient Active Problem List   Diagnosis Date Noted   Adjustment disorder with mixed disturbance of emotions and conduct 10/10/2023   Bipolar I disorder (HCC) 10/10/2023   Infectious process 10/03/2023   Malingering 10/03/2023   CAP (community acquired pneumonia) 09/30/2023   Acute respiratory failure with hypoxia (HCC) 09/29/2023   Elevated LFTs 09/26/2023   Cellulitis of both lower extremities 09/26/2023   Normocytic anemia 09/26/2023   Protein-calorie malnutrition, severe 09/26/2023   Necrotizing fasciitis due to Streptococcus pyogenes (HCC)    Wound infection 10/21/2019   Cellulitis 10/21/2019   Substance abuse (HCC) 10/21/2019   Anxiety 10/21/2019   Fever    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

## 2023-10-15 ENCOUNTER — Ambulatory Visit (HOSPITAL_COMMUNITY): Admitting: Licensed Clinical Social Worker

## 2023-10-15 DIAGNOSIS — F319 Bipolar disorder, unspecified: Secondary | ICD-10-CM | POA: Diagnosis not present

## 2023-10-15 DIAGNOSIS — F19139 Other psychoactive substance abuse with withdrawal, unspecified: Secondary | ICD-10-CM | POA: Diagnosis not present

## 2023-10-15 DIAGNOSIS — R45851 Suicidal ideations: Secondary | ICD-10-CM | POA: Diagnosis not present

## 2023-10-15 DIAGNOSIS — F411 Generalized anxiety disorder: Secondary | ICD-10-CM | POA: Diagnosis not present

## 2023-10-15 MED ORDER — RAMELTEON 8 MG PO TABS: 8.0000 mg | ORAL_TABLET | Freq: Every day | ORAL | 0 refills | Status: AC

## 2023-10-15 MED ORDER — GABAPENTIN 100 MG PO CAPS: 100.0000 mg | ORAL_CAPSULE | Freq: Three times a day (TID) | ORAL | 0 refills | Status: AC

## 2023-10-15 MED ORDER — VITAMIN B-1 100 MG PO TABS: 100.0000 mg | ORAL_TABLET | Freq: Every day | ORAL | Status: AC

## 2023-10-15 MED ORDER — ADULT MULTIVITAMIN W/MINERALS CH: 1.0000 | ORAL_TABLET | Freq: Every day | ORAL | Status: AC

## 2023-10-15 MED ORDER — OLANZAPINE 5 MG PO TBDP: 5.0000 mg | ORAL_TABLET | Freq: Two times a day (BID) | ORAL | 0 refills | Status: AC

## 2023-10-15 MED ORDER — VITAMIN D3 25 MCG PO TABS: 2000.0000 [IU] | ORAL_TABLET | Freq: Every day | ORAL | Status: AC

## 2023-10-15 MED ORDER — CEFDINIR 300 MG PO CAPS: 300.0000 mg | ORAL_CAPSULE | Freq: Two times a day (BID) | ORAL | 0 refills | Status: AC

## 2023-10-15 MED ORDER — MIRTAZAPINE 15 MG PO TBDP: 15.0000 mg | ORAL_TABLET | Freq: Every day | ORAL | 0 refills | Status: AC

## 2023-10-15 NOTE — Care Management (Addendum)
 [facility] Care Management  Writer met with the patient and discussed discharge planning.  Patient requests to be discharged with outpatient resources for individual therapy due to her insurance declining to pay for CD-IOP services. .   Patient has an appointment with Elsie Nose today 10-15-23 at [address]. [employer], [address] in [place] Office at 2:30 p.m.   Phone: [phone] for his direct line and her main number is [phone].       Patient has an intake appointment on Friday 10-16-23 at am in person for medication management at [facility]  Address:  [address] [place], [place]  [address] Phone: [phone]

## 2023-10-15 NOTE — ED Notes (Signed)
 [employer] taxi service called to transport pt to home. Patient A&O x 4, ambulatory. Patient discharged in no acute distress. Patient denied SI/HI, A/VH upon discharge. Patient verbalized understanding of all discharge instructions reviewed on AVS via staff, to include follow up appointments, RX's and safety. Suicide safety plan completed and reviewed with Clinical research associate. A copy given to pt. Patient reported mood 10/10.  Pt belongings returned to patient from locker #15 intact. Patient escorted to lobby via staff for transport to destination. Safety maintained.

## 2023-10-15 NOTE — ED Notes (Signed)
 Patient asleep, no COWS assessment done at this time.

## 2023-10-15 NOTE — ED Notes (Signed)
 Patient A&Ox4. Denies intent to harm self/others when asked. Denies A/VH. Patient denies any physical complaints when asked. No acute distress noted. Pt refused bloodwork this am stating, I'm a hard stick. I don't want it done. Provider made aware of pt's refusal and concern about blood draw. Routine safety checks conducted according to facility protocol. Encouraged patient to notify staff if thoughts of harm toward self or others arise. Patient verbalize understanding and agreement. Will continue to monitor for safety.

## 2023-10-15 NOTE — ED Notes (Signed)
Patient asleep NAD

## 2023-10-15 NOTE — ED Provider Notes (Signed)
 [facility]BC/OBS ASAP Discharge Summary  Date and Time: 10/15/2023 9:56 AM  Name: [name] [name]  MRN:  [id number]   Discharge Diagnoses:  Final diagnoses:  Opioid use with withdrawal (HCC)  Methamphetamine dependence (HCC)  Ankylosing spondylitis of multiple sites in spine (HCC)  Severe episode of recurrent major depressive disorder, without psychotic features (HCC)  Substance induced mood disorder (HCC)  Pneumonia of both lungs due to infectious organism, unspecified part of lung  Cellulitis of lower extremity, unspecified laterality  Vitamin D  deficiency    Subjective: Patient seen and evaluated face-to-face by this provider, chart reviewed and case discussed with Dr. Cole. On evaluation, patient is alert and oriented x 4. Thought process is linear and goal oriented.  Thought content is negative for SI/HI/AVH. Objectively, no signs of acute psychosis. Patient verbalizes readiness for discharge today, and is agreeable to following up with outpatient counseling and attending scheduled appointment for today with Bill at the [facility] at 2:30 PM. She was also advised that the case manager scheduled her an appointment for medication management tomorrow (9/26) at 11 AM. She states that she would like to leave the facility first and go home so that she can pack up her belongings because she only has until October 1 to continue living with her boyfriend's parents. She denies depressive symptoms. She denies anxiety symptoms. She reports improved sleep. She reports a good appetite. She denies physical pain. She denies withdrawal symptoms or drug cravings. She denies medication side effects. She denies questions or concerns at this time. Patient advised to abstain from use of illicit drugs and excessive alcohol  use.   Stay Summary: [name] [name] is a 43 year old female, with a complicated medical history secondary to chronic opioid use who was admitted to the [facility] for opioid use.  Patient was hospitalized from 09/25/2023-9/162025 after presenting to ED due to abdominal pain, fever, and subsequently developing SOB which resulted in hypoxia and finding of pneumonia. Patient received antibiotic therapy with cefadroxil  500 mg BID, for resolution of pneumonia. Patient completed clonidine  taper for treatment of opiate withdrawal without any side effects.  Total Time spent with patient: 30 minutes  Past Psychiatric History: history of anxiety and Depression psychiatric hospitalizations in the past. PTSD. Self reports hx of Bipolar disorder-admitting provider ordered Haldol  0.5 mg BID for reported AH 10/12/23   Past Medical History: History of necrotizing fascitis, pneumonia, cellulitis, 15 surgeries, Ankylosing Spondylitis She reports 50 staples in her leg that need to be removed, Spinal abscess.   Family History: Per patient, daughter died of Sepsis suddenly at age 50 from a dislocated knee. Mother bipolar disorder. She does not know her father or her father's family  Social History: Divorced. Has 2 living adult children. Homeless but may be able to live with boyfriend and his parents.    Tobacco Cessation:  A prescription for an FDA-approved tobacco cessation medication was offered at discharge and the patient refused  Current Medications:  Current Facility-Administered Medications  Medication Dose Route Frequency Provider Last Rate Last Admin   acetaminophen  (TYLENOL ) tablet 650 mg  650 mg Oral Q6H PRN Randall Starlyn HERO, NP   650 mg at 10/14/23 2105   alum & mag hydroxide-simeth (MAALOX/MYLANTA) 200-200-20 MG/5ML suspension 30 mL  30 mL Oral Q4H PRN Randall Starlyn HERO, NP   30 mL at 10/12/23 2112   bacitracin  ointment 1 Application  1 Application Topical PRN Arloa Suzen RAMAN, NP  cefdinir  (OMNICEF ) capsule 300 mg  300 mg Oral Q12H Arloa Suzen RAMAN, NP   300 mg at 10/15/23 0945   cholecalciferol  (VITAMIN D3) 25 MCG (1000 UNIT)  tablet 2,000 Units  2,000 Units Oral Daily Arloa Suzen RAMAN, NP   2,000 Units at 10/15/23 0900   cloNIDine  (CATAPRES ) tablet 0.1 mg  0.1 mg Oral QAC breakfast Lawrnce, Rhoda J, MD   0.1 mg at 10/15/23 0900   dicyclomine  (BENTYL ) tablet 20 mg  20 mg Oral Q6H PRN Randall Starlyn HERO, NP   20 mg at 10/10/23 2133   haloperidol  (HALDOL ) tablet 5 mg  5 mg Oral TID PRN Randall Starlyn HERO, NP   5 mg at 10/10/23 2229   And   diphenhydrAMINE  (BENADRYL ) capsule 50 mg  50 mg Oral TID PRN Randall Starlyn HERO, NP   50 mg at 10/10/23 2229   haloperidol  lactate (HALDOL ) injection 10 mg  10 mg Intramuscular TID PRN Randall Starlyn HERO, NP       And   diphenhydrAMINE  (BENADRYL ) injection 50 mg  50 mg Intramuscular TID PRN Randall Starlyn HERO, NP       And   LORazepam  (ATIVAN ) injection 2 mg  2 mg Intramuscular TID PRN Randall Starlyn HERO, NP       haloperidol  lactate (HALDOL ) injection 5 mg  5 mg Intramuscular TID PRN Randall Starlyn HERO, NP       And   diphenhydrAMINE  (BENADRYL ) injection 50 mg  50 mg Intramuscular TID PRN Randall Starlyn HERO, NP       And   LORazepam  (ATIVAN ) injection 2 mg  2 mg Intramuscular TID PRN Randall, Veronique M, NP       gabapentin  (NEURONTIN ) capsule 100 mg  100 mg Oral TID Gottfried, Rhoda J, MD   100 mg at 10/15/23 0944   hydrOXYzine  (ATARAX ) tablet 25 mg  25 mg Oral Q6H PRN Randall Starlyn HERO, NP   25 mg at 10/12/23 2110   loperamide  (IMODIUM ) capsule 2-4 mg  2-4 mg Oral PRN Randall Starlyn HERO, NP   4 mg at 10/13/23 1059   magnesium  hydroxide (MILK OF MAGNESIA) suspension 30 mL  30 mL Oral Daily PRN Randall Starlyn HERO, NP       methocarbamol  (ROBAXIN ) tablet 500 mg  500 mg Oral Q8H PRN Randall, Veronique M, NP   500 mg at 10/14/23 1706   mirtazapine  (REMERON  SOL-TAB) disintegrating tablet 15 mg  15 mg Oral QHS Arloa Suzen RAMAN, NP   15 mg at 10/14/23 2103   multivitamin with minerals tablet 1 tablet  1 tablet Oral Daily Gottfried, Rhoda J, MD    1 tablet at 10/15/23 0945   OLANZapine  zydis (ZYPREXA ) disintegrating tablet 5 mg  5 mg Oral BID Arloa Suzen RAMAN, NP   5 mg at 10/15/23 0946   promethazine  (PHENERGAN ) tablet 25 mg  25 mg Oral Q6H PRN Gottfried, Rhoda J, MD   25 mg at 10/12/23 1228   ramelteon  (ROZEREM ) tablet 8 mg  8 mg Oral QHS Gottfried, Rhoda J, MD   8 mg at 10/14/23 2102   thiamine  (VITAMIN B1) tablet 100 mg  100 mg Oral Daily Gottfried, Rhoda J, MD   100 mg at 10/15/23 0946   traZODone  (DESYREL ) tablet 50 mg  50 mg Oral QHS PRN Randall Starlyn HERO, NP   50 mg at 10/14/23 2103   Current Outpatient Medications  Medication Sig Dispense Refill   cefdinir  (OMNICEF ) 300 MG capsule Take 1 capsule (300 mg total)  by mouth every 12 (twelve) hours for 10 doses. 10 capsule 0   [START ON 10/16/2023] cholecalciferol  (CHOLECALCIFEROL ) 25 MCG tablet Take 2 tablets (2,000 Units total) by mouth daily.     gabapentin  (NEURONTIN ) 100 MG capsule Take 1 capsule (100 mg total) by mouth 3 (three) times daily. 90 capsule 0   mirtazapine  (REMERON  SOL-TAB) 15 MG disintegrating tablet Take 1 tablet (15 mg total) by mouth at bedtime. 30 tablet 0   [START ON 10/16/2023] Multiple Vitamin (MULTIVITAMIN WITH MINERALS) TABS tablet Take 1 tablet by mouth daily.     OLANZapine  zydis (ZYPREXA ) 5 MG disintegrating tablet Take 1 tablet (5 mg total) by mouth 2 (two) times daily. 60 tablet 0   ramelteon  (ROZEREM ) 8 MG tablet Take 1 tablet (8 mg total) by mouth at bedtime. 30 tablet 0   [START ON 10/16/2023] thiamine  (VITAMIN B-1) 100 MG tablet Take 1 tablet (100 mg total) by mouth daily.      PTA Medications:  Facility Ordered Medications  Medication   [COMPLETED] promethazine  (PHENERGAN ) tablet 25 mg   [COMPLETED] melatonin tablet 3 mg   [COMPLETED] prochlorperazine  (COMPAZINE ) tablet 5 mg   [COMPLETED] loperamide  (IMODIUM ) capsule 4 mg   acetaminophen  (TYLENOL ) tablet 650 mg   alum & mag hydroxide-simeth (MAALOX/MYLANTA) 200-200-20 MG/5ML suspension 30  mL   haloperidol  (HALDOL ) tablet 5 mg   And   diphenhydrAMINE  (BENADRYL ) capsule 50 mg   haloperidol  lactate (HALDOL ) injection 10 mg   And   diphenhydrAMINE  (BENADRYL ) injection 50 mg   And   LORazepam  (ATIVAN ) injection 2 mg   haloperidol  lactate (HALDOL ) injection 5 mg   And   diphenhydrAMINE  (BENADRYL ) injection 50 mg   And   LORazepam  (ATIVAN ) injection 2 mg   magnesium  hydroxide (MILK OF MAGNESIA) suspension 30 mL   traZODone  (DESYREL ) tablet 50 mg   dicyclomine  (BENTYL ) tablet 20 mg   hydrOXYzine  (ATARAX ) tablet 25 mg   loperamide  (IMODIUM ) capsule 2-4 mg   methocarbamol  (ROBAXIN ) tablet 500 mg   [EXPIRED] cloNIDine  (CATAPRES ) tablet 0.1 mg   Followed by   [COMPLETED] cloNIDine  (CATAPRES ) tablet 0.1 mg   Followed by   cloNIDine  (CATAPRES ) tablet 0.1 mg   promethazine  (PHENERGAN ) tablet 25 mg   [COMPLETED] thiamine  (VITAMIN B1) injection 100 mg   thiamine  (VITAMIN B1) tablet 100 mg   multivitamin with minerals tablet 1 tablet   gabapentin  (NEURONTIN ) capsule 100 mg   [COMPLETED] predniSONE  (DELTASONE ) tablet 20 mg   ramelteon  (ROZEREM ) tablet 8 mg   cholecalciferol  (VITAMIN D3) 25 MCG (1000 UNIT) tablet 2,000 Units   bacitracin  ointment 1 Application   cefdinir  (OMNICEF ) capsule 300 mg   OLANZapine  zydis (ZYPREXA ) disintegrating tablet 5 mg   mirtazapine  (REMERON  SOL-TAB) disintegrating tablet 15 mg   PTA Medications  Medication Sig   ramelteon  (ROZEREM ) 8 MG tablet Take 1 tablet (8 mg total) by mouth at bedtime.   cefdinir  (OMNICEF ) 300 MG capsule Take 1 capsule (300 mg total) by mouth every 12 (twelve) hours for 10 doses.   [START ON 10/16/2023] cholecalciferol  (CHOLECALCIFEROL ) 25 MCG tablet Take 2 tablets (2,000 Units total) by mouth daily.   gabapentin  (NEURONTIN ) 100 MG capsule Take 1 capsule (100 mg total) by mouth 3 (three) times daily.   mirtazapine  (REMERON  SOL-TAB) 15 MG disintegrating tablet Take 1 tablet (15 mg total) by mouth at bedtime.   [START ON  10/16/2023] Multiple Vitamin (MULTIVITAMIN WITH MINERALS) TABS tablet Take 1 tablet by mouth daily.   OLANZapine  zydis (ZYPREXA ) 5 MG  disintegrating tablet Take 1 tablet (5 mg total) by mouth 2 (two) times daily.   [START ON 10/16/2023] thiamine  (VITAMIN B-1) 100 MG tablet Take 1 tablet (100 mg total) by mouth daily.       10/15/2023    9:54 AM 10/11/2023    1:12 PM 10/10/2023    5:18 PM  Depression screen PHQ 2/9  Decreased Interest 1 3 2   Down, Depressed, Hopeless 0 3 2  PHQ - 2 Score 1 6 4   Altered sleeping  3 2  Tired, decreased energy  3 2  Change in appetite  3 1  Feeling bad or failure about yourself   3 2  Trouble concentrating  3 1  Moving slowly or fidgety/restless  3 2  Suicidal thoughts  3 2  PHQ-9 Score  27 16  Difficult doing work/chores  Extremely dIfficult Very difficult    Flowsheet Row ED from 10/10/2023 in [facility] Most recent reading at 10/11/2023  3:49 AM ED from 10/10/2023 in [facility] Most recent reading at 10/10/2023  6:27 PM ED from 10/09/2023 in [facility] Emergency Department at [facility] Most recent reading at 10/09/2023  5:13 PM  C-SSRS RISK CATEGORY Moderate Risk High Risk No Risk    Musculoskeletal  Strength & Muscle Tone: within normal limits Gait & Station: normal Patient leans: N/A  Psychiatric Specialty Exam  Presentation  General Appearance:  Appropriate for Environment  Eye Contact: Fair  Speech: Clear and Coherent  Speech Volume: Normal  Handedness: Right   Mood and Affect  Mood: Euthymic  Affect: Congruent   Thought Process  Thought Processes: Coherent; Goal Directed  Descriptions of Associations:Intact  Orientation:Full (Time, Place and Person)  Thought Content:Logical  Diagnosis of Schizophrenia or Schizoaffective disorder in past: No    Hallucinations:Hallucinations: None  Ideas of Reference:None  Suicidal Thoughts:Suicidal Thoughts:  No  Homicidal Thoughts:Homicidal Thoughts: No   Sensorium  Memory: Immediate Fair; Recent Fair; Remote Fair  Judgment: Fair  Insight: Fair   Chartered certified accountant: Fair  Attention Span: Fair  Recall: [employer] of Knowledge: Fair  Language: Fair   Psychomotor Activity  Psychomotor Activity: Psychomotor Activity: Normal   Assets  Assets: Communication Skills; Desire for Improvement; Housing; Leisure Time; Physical Health   Sleep  Sleep: Sleep: Fair   Physical Exam  Physical Exam Eyes:     Conjunctiva/sclera: Conjunctivae normal.  Pulmonary:     Effort: Pulmonary effort is normal.  Musculoskeletal:        General: Normal range of motion.     Cervical back: Normal range of motion.  Neurological:     Mental Status: She is alert and oriented to person, place, and time.    Review of Systems  Constitutional: Negative.   HENT: Negative.    Eyes: Negative.   Respiratory: Negative.    Cardiovascular: Negative.   Gastrointestinal: Negative.   Genitourinary: Negative.   Musculoskeletal: Negative.   Neurological: Negative.   Endo/Heme/Allergies: Negative.    Blood pressure 100/70, pulse 100, temperature 98.6 F (37 C), temperature source Oral, resp. rate 17, last menstrual period 08/10/2011, SpO2 98%. There is no height or weight on file to calculate BMI.  Demographic Factors:  Caucasian and Low socioeconomic status  Loss Factors: Financial problems/change in socioeconomic status  Historical Factors: NA  Risk Reduction Factors:   Living with another person, especially a relative and Positive social support  Continued Clinical Symptoms:  Alcohol /Substance Abuse/Dependencies Previous Psychiatric  Diagnoses and Treatments  Cognitive Features That Contribute To Risk:  None    Suicide Risk:  Minimal: No identifiable suicidal ideation.  Patients presenting with no risk factors but with morbid ruminations; may be classified as  minimal risk based on the severity of the depressive symptoms  Plan Of Care/Follow-up recommendations:    Medications: Patient is to take medications as prescribed. The patient or patient's guardian is to contact a medical professional and/or outpatient provider to address any new side effects that develop. The patient or the patient's guardian should update outpatient providers of any new medications and/or medication changes.   Patient was provided with a 30-day prescription for gabapentin , Remeron , multivitamin, olanzapine , Rozerem , and cefdinir  (10 doses).  Outpatient Follow up: Patient has an intake appointment for individual therapy with Elsie Maier at [facility] on Thursday 10-15-23 at 2:30 in person  Location: [address] [place]  Phone: [phone]  Patient has an intake appointment on Friday 10-16-23 at am in person for medication management at [facility]  Address:  [address] [place], [place]  [address] Phone: [phone]  Please follow up with your primary care provider for all medical related needs.   Therapy: We recommend that patient participate in individual therapy to address mental health concerns.  Atypical antipsychotics: If you are prescribed an atypical antipsychotic, it is recommended that your height, weight, BMI, blood pressure, fasting lipid panel, and fasting blood sugar be monitored by your outpatient providers.  Safety:   The following safety precautions should be taken:   No sharp objects. This includes scissors, razors, scrapers, and putty knives.   Chemicals should be removed and locked up.   Medications should be removed and locked up.   Weapons should be removed and locked up. This includes firearms, knives and instruments that can be used to cause injury.   The patient should abstain from use of illicit substances/drugs and abuse of any medications.  If symptoms worsen or do not continue to improve  or if the patient becomes actively suicidal or homicidal then it is recommended that the patient return to the closest hospital emergency department, the [facility], or call 911 for further evaluation and treatment. National Suicide Prevention Lifeline 1-800-SUICIDE or 782-476-3345.  About 988 988 offers 24/7 access to trained crisis counselors who can help people experiencing mental health-related distress. People can call or text 988 or chat 988lifeline.org for themselves or if they are worried about a loved one who may need crisis support.   Disposition: Discharge home and was provided with a taxi voucher.   Averi Cacioppo L, NP 10/15/2023, 9:56 AM

## 2023-10-16 ENCOUNTER — Telehealth (HOSPITAL_COMMUNITY): Payer: Self-pay | Admitting: Licensed Clinical Social Worker

## 2023-10-16 ENCOUNTER — Encounter (HOSPITAL_COMMUNITY): Payer: Self-pay | Admitting: Licensed Clinical Social Worker

## 2023-10-16 NOTE — Telephone Encounter (Signed)
 The therapist attempts to reach [facility] in relation to her no show yesterday but is unable to leave a HIPAA-compliant voicemail as her voicemail box is full. So, the therapist sends no show correspondence via MyChart.  Zell Maier, MA, LCSW, [facility], LCAS 10/16/2023

## 2023-10-21 ENCOUNTER — Emergency Department (HOSPITAL_COMMUNITY)
Admission: EM | Admit: 2023-10-21 | Discharge: 2023-10-22 | Disposition: A | Discharge status: Home / Self Care | Attending: Emergency Medicine | Admitting: Emergency Medicine

## 2023-10-21 DIAGNOSIS — T391X1A Poisoning by 4-Aminophenol derivatives, accidental (unintentional), initial encounter: Secondary | ICD-10-CM | POA: Insufficient documentation

## 2023-10-21 DIAGNOSIS — X58XXXA Exposure to other specified factors, initial encounter: Secondary | ICD-10-CM | POA: Diagnosis not present

## 2023-10-21 DIAGNOSIS — R1011 Right upper quadrant pain: Secondary | ICD-10-CM | POA: Insufficient documentation

## 2023-10-21 DIAGNOSIS — R112 Nausea with vomiting, unspecified: Secondary | ICD-10-CM | POA: Diagnosis not present

## 2023-10-21 DIAGNOSIS — T50901A Poisoning by unspecified drugs, medicaments and biological substances, accidental (unintentional), initial encounter: Secondary | ICD-10-CM | POA: Diagnosis present

## 2023-10-21 NOTE — ED Triage Notes (Signed)
 BIB [facility] from jail.  States she took approximately 30 extra strength tylenol  over a time.  Last dose 48 hours ago approximately.  Reports this was not intentional, did not realize they were extra strength.  Has chronic back pain and was trying to treat.

## 2023-10-22 ENCOUNTER — Encounter (HOSPITAL_COMMUNITY): Payer: Self-pay | Admitting: Emergency Medicine

## 2023-10-22 ENCOUNTER — Other Ambulatory Visit: Payer: Self-pay

## 2023-10-22 LAB — CBC
HCT: 35 % — ABNORMAL LOW (ref 36.0–46.0)
Hemoglobin: 11 g/dL — ABNORMAL LOW (ref 12.0–15.0)
MCH: 26.6 pg (ref 26.0–34.0)
MCHC: 31.4 g/dL (ref 30.0–36.0)
MCV: 84.7 fL (ref 80.0–100.0)
Platelets: 347 K/uL (ref 150–400)
RBC: 4.13 MIL/uL (ref 3.87–5.11)
RDW: 19.1 % — ABNORMAL HIGH (ref 11.5–15.5)
WBC: 6.6 K/uL (ref 4.0–10.5)
nRBC: 0 % (ref 0.0–0.2)

## 2023-10-22 LAB — URINE DRUG SCREEN
Amphetamines: NEGATIVE
Barbiturates: NEGATIVE
Benzodiazepines: NEGATIVE
Cocaine: NEGATIVE
Fentanyl: POSITIVE — AB
Methadone Scn, Ur: NEGATIVE
Opiates: NEGATIVE
Tetrahydrocannabinol: NEGATIVE

## 2023-10-22 LAB — COMPREHENSIVE METABOLIC PANEL WITH GFR
ALT: 55 U/L — ABNORMAL HIGH (ref 0–44)
AST: 61 U/L — ABNORMAL HIGH (ref 15–41)
Albumin: 4 g/dL (ref 3.5–5.0)
Alkaline Phosphatase: 229 U/L — ABNORMAL HIGH (ref 38–126)
Anion gap: 13 (ref 5–15)
BUN: 14 mg/dL (ref 6–20)
CO2: 20 mmol/L — ABNORMAL LOW (ref 22–32)
Calcium: 9.8 mg/dL (ref 8.9–10.3)
Chloride: 101 mmol/L (ref 98–111)
Creatinine, Ser: 0.53 mg/dL (ref 0.44–1.00)
GFR, Estimated: >60 mL/min (ref >60)
Glucose, Bld: 108 mg/dL — ABNORMAL HIGH (ref 70–99)
Potassium: 4.7 mmol/L (ref 3.5–5.1)
Sodium: 134 mmol/L — ABNORMAL LOW (ref 135–145)
Total Bilirubin: 0.7 mg/dL (ref 0.0–1.2)
Total Protein: 8 g/dL (ref 6.5–8.1)

## 2023-10-22 LAB — URINALYSIS, ROUTINE W REFLEX MICROSCOPIC
Bilirubin Urine: NEGATIVE
Glucose, UA: NEGATIVE mg/dL
Hgb urine dipstick: NEGATIVE
Ketones, ur: NEGATIVE mg/dL
Leukocytes,Ua: NEGATIVE
Nitrite: NEGATIVE
Protein, ur: NEGATIVE mg/dL
Specific Gravity, Urine: 1.016 (ref 1.005–1.030)
pH: 6 (ref 5.0–8.0)

## 2023-10-22 LAB — PREGNANCY, URINE: Preg Test, Ur: NEGATIVE

## 2023-10-22 LAB — PROTIME-INR
INR: 0.8 (ref 0.8–1.2)
Prothrombin Time: 11.9 s (ref 11.4–15.2)

## 2023-10-22 LAB — SALICYLATE LEVEL: Salicylate Lvl: <7 mg/dL — ABNORMAL LOW (ref 7.0–30.0)

## 2023-10-22 LAB — ETHANOL: Alcohol, Ethyl (B): <15 mg/dL (ref <15)

## 2023-10-22 LAB — ACETAMINOPHEN LEVEL: Acetaminophen (Tylenol), Serum: <10 ug/mL — ABNORMAL LOW (ref 10–30)

## 2023-10-22 MED ORDER — BUPRENORPHINE HCL-NALOXONE HCL 8-2 MG SL SUBL
1.0000 | SUBLINGUAL_TABLET | Freq: Once | SUBLINGUAL | Status: AC
  Administered 2023-10-22: 1 via SUBLINGUAL
  Filled 2023-10-22: qty 1

## 2023-10-22 NOTE — ED Notes (Signed)
Pt eating and drinking without any issues. 

## 2023-10-22 NOTE — ED Notes (Signed)
 Per Consuelo at poison control, after reviewing labs and EKG values pt is cleared and needs no intervention

## 2023-10-22 NOTE — ED Notes (Addendum)
 Called poison control who advised d/t time out from ingestion, dependent on labs for further recommendations.

## 2023-10-22 NOTE — ED Provider Notes (Signed)
 [place] EMERGENCY DEPARTMENT AT [facility] Provider Note   CSN: [id number] Arrival date & time: 10/21/23  2343     Patient presents with: Ingestion   [name] [name] is a 43 y.o. female.   The history is provided by the patient.  Ingestion  [name] [name] is a 43 y.o. female who presents to the Emergency Department complaining of abdominal pain. She presents to the emergency department for evaluation of right upper quadrant abdominal pain that started earlier today. She describes it as a burning sensation. She does report taking up to 28 extra strength Tylenol  over the course of two days. Last ingestion was 48 hours ago. No reported fevers. She does report nausea and vomiting. She denies any attempts at intentional self-harm. She states this was an attempt to control her back pain secondary to history of ankylosing spondylitis. She also has a history of necrotizing soft tissue infection, substance use disorder.     Prior to Admission medications   Medication Sig Start Date End Date Taking? Authorizing Provider  cholecalciferol  (CHOLECALCIFEROL ) 25 MCG tablet Take 2 tablets (2,000 Units total) by mouth daily. 10/16/23   White, Patrice L, NP  gabapentin  (NEURONTIN ) 100 MG capsule Take 1 capsule (100 mg total) by mouth 3 (three) times daily. 10/15/23   White, Patrice L, NP  mirtazapine  (REMERON  SOL-TAB) 15 MG disintegrating tablet Take 1 tablet (15 mg total) by mouth at bedtime. 10/15/23   White, Patrice L, NP  Multiple Vitamin (MULTIVITAMIN WITH MINERALS) TABS tablet Take 1 tablet by mouth daily. 10/16/23   White, Patrice L, NP  OLANZapine  zydis (ZYPREXA ) 5 MG disintegrating tablet Take 1 tablet (5 mg total) by mouth 2 (two) times daily. 10/15/23   White, Patrice L, NP  ramelteon  (ROZEREM ) 8 MG tablet Take 1 tablet (8 mg total) by mouth at bedtime. 10/15/23   White, Patrice L, NP  thiamine  (VITAMIN B-1) 100 MG tablet Take 1 tablet (100 mg total) by mouth daily. 10/16/23   White,  Patrice L, NP    Allergies: Epidural tray [id number] [nerve block tray], Ibuprofen , Penicillins, and Zofran  [ondansetron  hcl]    Review of Systems  All other systems reviewed and are negative.   Updated Vital Signs BP 112/64   Pulse 95   Temp 98.7 F (37.1 C) (Oral)   Resp 17   LMP 08/10/2011   SpO2 93%   Physical Exam Vitals and nursing note reviewed.  Constitutional:      Appearance: She is well-developed.  HENT:     Head: Normocephalic and atraumatic.  Cardiovascular:     Rate and Rhythm: Normal rate and regular rhythm.     Heart sounds: No murmur heard. Pulmonary:     Effort: Pulmonary effort is normal. No respiratory distress.     Breath sounds: Normal breath sounds.  Abdominal:     Palpations: Abdomen is soft.     Tenderness: There is no guarding or rebound.     Comments: Mild to moderate right upper quadrant tenderness  Musculoskeletal:        General: No tenderness.     Comments: None pitting edema to bilateral lower extremities. There are multiple linear scars to bilateral upper extremities  Skin:    General: Skin is warm and dry.  Neurological:     Mental Status: She is alert and oriented to person, place, and time.  Psychiatric:        Behavior: Behavior normal.     (all labs ordered are listed, but only  abnormal results are displayed) Labs Reviewed  COMPREHENSIVE METABOLIC PANEL WITH GFR - Abnormal; Notable for the following components:      Result Value   Sodium 134 (*)    CO2 20 (*)    Glucose, Bld 108 (*)    AST 61 (*)    ALT 55 (*)    Alkaline Phosphatase 229 (*)    All other components within normal limits  CBC - Abnormal; Notable for the following components:   Hemoglobin 11.0 (*)    HCT 35.0 (*)    RDW 19.1 (*)    All other components within normal limits  URINE DRUG SCREEN - Abnormal; Notable for the following components:   Fentanyl  POSITIVE (*)    All other components within normal limits  SALICYLATE LEVEL - Abnormal; Notable for  the following components:   Salicylate Lvl <7.0 (*)    All other components within normal limits  ACETAMINOPHEN  LEVEL - Abnormal; Notable for the following components:   Acetaminophen  (Tylenol ), Serum <10 (*)    All other components within normal limits  URINALYSIS, ROUTINE W REFLEX MICROSCOPIC - Abnormal; Notable for the following components:   APPearance HAZY (*)    All other components within normal limits  ETHANOL  PROTIME-INR  PREGNANCY, URINE    EKG: EKG Interpretation Date/Time:  Thursday October 22 2023 00:40:36 EDT Ventricular Rate:  100 PR Interval:  142 QRS Duration:  90 QT Interval:  355 QTC Calculation: 458 R Axis:   -12  Text Interpretation: Sinus tachycardia LVH by voltage Confirmed by Griselda Norris [phone]) on 10/22/2023 1:13:00 AM  Radiology: No results found.   Procedures   Medications Ordered in the ED  buprenorphine -naloxone  (SUBOXONE ) 8-2 mg per SL tablet 1 tablet (1 tablet Sublingual Given 10/22/23 0506)                                    Medical Decision Making Amount and/or Complexity of Data Reviewed Labs: ordered.  Risk Prescription drug management.   Patient with history of substance use disorder here for evaluation of right upper quadrant abdominal pain, reportedly took 30 extra strength Tylenol  over the course of two days, last dose two days prior to ED arrival and developed pain today. Labs are near her baseline with baseline elevation in her transaminases. She did recently have a right upper quadrant ultrasound with prior hospitalization. Current picture is not consistent with cholecystitis. No evidence of profound liver injury secondary to acetaminophen  exposure. She has been cleared by poison control in terms of acetaminophen  toxicity. In the emergency department she is able to take PO without difficulty. Feels she is stable for discharge back in police custody with outpatient follow-up and return precautions.     Final diagnoses:   RUQ abdominal pain  Accidental acetaminophen  overdose, initial encounter    ED Discharge Orders     None          Griselda Norris, MD 10/22/23 0700

## 2023-10-22 NOTE — Discharge Instructions (Signed)
 Avoid acetaminophen  and acetaminophen  containing products.  Please follow up with your family doctor for recheck.

## 2023-11-23 ENCOUNTER — Encounter: Payer: Self-pay | Admitting: Radiology
